# Patient Record
Sex: Male | Born: 1950 | Race: White | Hispanic: No | Marital: Married | Smoking: Former smoker
Health system: Southern US, Community
[De-identification: ages and names within clinical notes are randomized; demographics above are authoritative.]

## PROBLEM LIST (undated history)

## (undated) ENCOUNTER — Emergency Department (HOSPITAL_COMMUNITY): Admission: EM | Payer: Self-pay | Source: Home / Self Care

## (undated) DIAGNOSIS — G4730 Sleep apnea, unspecified: Secondary | ICD-10-CM

## (undated) DIAGNOSIS — F952 Tourette's disorder: Secondary | ICD-10-CM

## (undated) DIAGNOSIS — Z923 Personal history of irradiation: Secondary | ICD-10-CM

## (undated) DIAGNOSIS — E119 Type 2 diabetes mellitus without complications: Secondary | ICD-10-CM

## (undated) DIAGNOSIS — E7800 Pure hypercholesterolemia, unspecified: Secondary | ICD-10-CM

## (undated) DIAGNOSIS — R339 Retention of urine, unspecified: Secondary | ICD-10-CM

## (undated) DIAGNOSIS — K219 Gastro-esophageal reflux disease without esophagitis: Secondary | ICD-10-CM

## (undated) DIAGNOSIS — I4891 Unspecified atrial fibrillation: Secondary | ICD-10-CM

## (undated) DIAGNOSIS — Z973 Presence of spectacles and contact lenses: Secondary | ICD-10-CM

## (undated) DIAGNOSIS — I10 Essential (primary) hypertension: Secondary | ICD-10-CM

## (undated) DIAGNOSIS — I82409 Acute embolism and thrombosis of unspecified deep veins of unspecified lower extremity: Secondary | ICD-10-CM

## (undated) DIAGNOSIS — M1990 Unspecified osteoarthritis, unspecified site: Secondary | ICD-10-CM

## (undated) DIAGNOSIS — C61 Malignant neoplasm of prostate: Secondary | ICD-10-CM

## (undated) DIAGNOSIS — T8859XA Other complications of anesthesia, initial encounter: Secondary | ICD-10-CM

## (undated) DIAGNOSIS — Z978 Presence of other specified devices: Secondary | ICD-10-CM

## (undated) HISTORY — DX: Unspecified atrial fibrillation: I48.91

## (undated) HISTORY — PX: TONSILLECTOMY: SUR1361

## (undated) HISTORY — PX: SPINE SURGERY: SHX786

---
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
## 1999-07-10 ENCOUNTER — Encounter: Payer: Self-pay | Admitting: *Deleted

## 1999-07-10 ENCOUNTER — Ambulatory Visit (HOSPITAL_COMMUNITY): Admission: RE | Admit: 1999-07-10 | Discharge: 1999-07-10 | Payer: Self-pay | Admitting: *Deleted

## 1999-09-02 HISTORY — PX: KNEE SURGERY: SHX244

## 1999-12-30 ENCOUNTER — Emergency Department (HOSPITAL_COMMUNITY): Admission: EM | Admit: 1999-12-30 | Discharge: 1999-12-30 | Payer: Self-pay | Admitting: Emergency Medicine

## 2000-03-09 ENCOUNTER — Emergency Department (HOSPITAL_COMMUNITY): Admission: EM | Admit: 2000-03-09 | Discharge: 2000-03-09 | Payer: Self-pay | Admitting: *Deleted

## 2001-02-22 ENCOUNTER — Emergency Department (HOSPITAL_COMMUNITY): Admission: EM | Admit: 2001-02-22 | Discharge: 2001-02-22 | Payer: Self-pay | Admitting: Internal Medicine

## 2004-04-17 ENCOUNTER — Emergency Department (HOSPITAL_COMMUNITY): Admission: EM | Admit: 2004-04-17 | Discharge: 2004-04-17 | Payer: Self-pay

## 2004-05-18 ENCOUNTER — Emergency Department (HOSPITAL_COMMUNITY): Admission: EM | Admit: 2004-05-18 | Discharge: 2004-05-18 | Payer: Self-pay | Admitting: Emergency Medicine

## 2008-05-25 ENCOUNTER — Emergency Department (HOSPITAL_COMMUNITY): Admission: EM | Admit: 2008-05-25 | Discharge: 2008-05-25 | Payer: Self-pay | Admitting: Emergency Medicine

## 2011-10-08 ENCOUNTER — Ambulatory Visit (INDEPENDENT_AMBULATORY_CARE_PROVIDER_SITE_OTHER): Payer: BC Managed Care – PPO | Admitting: Physician Assistant

## 2011-10-08 VITALS — BP 140/78 | HR 76 | Temp 98.1°F | Resp 16 | Ht 70.0 in | Wt 288.4 lb

## 2011-10-08 DIAGNOSIS — M79609 Pain in unspecified limb: Secondary | ICD-10-CM

## 2011-10-08 DIAGNOSIS — S61209A Unspecified open wound of unspecified finger without damage to nail, initial encounter: Secondary | ICD-10-CM

## 2011-10-08 DIAGNOSIS — M79646 Pain in unspecified finger(s): Secondary | ICD-10-CM

## 2011-10-08 NOTE — Patient Instructions (Signed)
WOUND CARE °Please return in 8 days to have your °stitches/staples removed or sooner if you have concerns. ° Keep area clean and dry for 24 hours. Do not remove °bandage, if applied. ° After 24 hours, remove bandage and wash wound °gently with mild soap and warm water. Reapply °a new bandage after cleaning wound, if directed. ° Continue daily cleansing with soap and water until °stitches/staples are removed. ° Do not apply any ointments or creams to the wound °while stitches/staples are in place, as this may cause °delayed healing. ° Notify the office if you experience any of the following °signs of infection: Swelling, redness, pus drainage, °streaking, fever >101.0 F ° Notify the office if you experience excessive bleeding °that does not stop after 15-20 minutes of constant, firm °pressure. ° °

## 2011-10-08 NOTE — Progress Notes (Signed)
  Subjective:    Patient ID: Tommy Yang, male    DOB: 04-21-1951, 61 y.o.   MRN: [id number]  HPI Patient presents after accidentally cutting the tip of his left middle finger with an X-Acto knife this morning. He builds model cars. Last tetanus was more than 5 years ago but less than 10. He advises me that 2-3 5-0 sutures will likely do the job. 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  He is right-hand dominant.  Review of Systems Remarkable for a wound to the tip of the left middle finger, otherwise negative.    Objective:   Physical Exam  Superficial wound at the tip of the left middle finger. Significant callus at the tips of all fingers consistent with his hobby of modeling. Good sensation in full range of motion. No evidence of infection.  Verbal consent obtained from the patient.  Local anesthesia with 4cc Lidocaine 2% without epinephrine.  Wound scrubbed with soap and water and rinsed.  Wound closed with 1 5-0 Prolene horizontal mattress sutures.  Wound cleansed and dressed.     Assessment & Plan:  1. pain, finger  2. wound, finger  TDap given. Local wound care. Return in 7-10 days for suture removal, sooner if needed.

## 2012-02-24 ENCOUNTER — Ambulatory Visit: Payer: BC Managed Care – PPO

## 2012-02-24 ENCOUNTER — Ambulatory Visit (INDEPENDENT_AMBULATORY_CARE_PROVIDER_SITE_OTHER): Payer: BC Managed Care – PPO | Admitting: Family Medicine

## 2012-02-24 VITALS — BP 135/74 | HR 68 | Temp 98.4°F | Resp 18 | Ht 70.0 in | Wt 272.0 lb

## 2012-02-24 DIAGNOSIS — M79609 Pain in unspecified limb: Secondary | ICD-10-CM

## 2012-02-24 DIAGNOSIS — S99921A Unspecified injury of right foot, initial encounter: Secondary | ICD-10-CM

## 2012-02-24 DIAGNOSIS — M79676 Pain in unspecified toe(s): Secondary | ICD-10-CM

## 2012-02-24 DIAGNOSIS — S8990XA Unspecified injury of unspecified lower leg, initial encounter: Secondary | ICD-10-CM

## 2012-02-24 NOTE — Progress Notes (Signed)
Subjective: 61 year old white male who stubbed his toes of the right foot last night against day table leg. He was barefooted. He has pain in the third and fourth toes.  Objective: Mild redness at the tip of the third toe. Both third and fourth ovary tender to touch. The metatarsals do not seem tender. There the toes also fine.  Assessment: Third and fourth toe pain secondary to stubbing toe  Plan: X-ray toes  [facility] reading (PRIMARY) by  Dr. Alwyn Ren No fracture  Reassurance. No fracture seen. I'll let him know if radiologist sees anything else. He is to be careful and to try and avoid reinjury to the foot. Return if problems. Thank you

## 2012-02-24 NOTE — Patient Instructions (Addendum)
Wear comfortable shoes. Consider taping the toes if pain is persisting. Return if worse. [name]

## 2013-01-15 ENCOUNTER — Ambulatory Visit: Payer: BC Managed Care – PPO

## 2013-01-15 ENCOUNTER — Ambulatory Visit (INDEPENDENT_AMBULATORY_CARE_PROVIDER_SITE_OTHER): Payer: BC Managed Care – PPO | Admitting: Family Medicine

## 2013-01-15 VITALS — BP 128/74 | HR 77 | Temp 99.0°F | Resp 17 | Ht 69.0 in | Wt 279.0 lb

## 2013-01-15 DIAGNOSIS — R05 Cough: Secondary | ICD-10-CM

## 2013-01-15 DIAGNOSIS — J189 Pneumonia, unspecified organism: Secondary | ICD-10-CM

## 2013-01-15 DIAGNOSIS — R059 Cough, unspecified: Secondary | ICD-10-CM

## 2013-01-15 MED ORDER — AZITHROMYCIN 250 MG PO TABS: ORAL_TABLET | ORAL | Status: DC

## 2013-01-15 NOTE — Patient Instructions (Signed)
Start the antibiotic for possible early pneumonia.  mucinex or Mucinex DM for cough - stop and return to clinic if any wheezing, or more shortness of breath. If not improving in 2-3 days - return to clinic.  Return to the clinic or go to the nearest emergency room if any of your symptoms worsen or new symptoms occur.   Pneumonia, Adult Pneumonia is an infection of the lungs.  CAUSES Pneumonia may be caused by bacteria or a virus. Usually, these infections are caused by breathing infectious particles into the lungs (respiratory tract). SYMPTOMS   Cough.  Fever.  Chest pain.  Increased rate of breathing.  Wheezing.  Mucus production. DIAGNOSIS  If you have the common symptoms of pneumonia, your caregiver will typically confirm the diagnosis with a chest X-ray. The X-ray will show an abnormality in the lung (pulmonary infiltrate) if you have pneumonia. Other tests of your blood, urine, or sputum may be done to find the specific cause of your pneumonia. Your caregiver may also do tests (blood gases or pulse oximetry) to see how well your lungs are working. TREATMENT  Some forms of pneumonia may be spread to other people when you cough or sneeze. You may be asked to wear a mask before and during your exam. Pneumonia that is caused by bacteria is treated with antibiotic medicine. Pneumonia that is caused by the influenza virus may be treated with an antiviral medicine. Most other viral infections must run their course. These infections will not respond to antibiotics.  PREVENTION A pneumococcal shot (vaccine) is available to prevent a common bacterial cause of pneumonia. This is usually suggested for:  People over 55 years old.  Patients on chemotherapy.  People with chronic lung problems, such as bronchitis or emphysema.  People with immune system problems. If you are over 65 or have a high risk condition, you may receive the pneumococcal vaccine if you have not received it before. In  some countries, a routine influenza vaccine is also recommended. This vaccine can help prevent some cases of pneumonia.You may be offered the influenza vaccine as part of your care. If you smoke, it is time to quit. You may receive instructions on how to stop smoking. Your caregiver can provide medicines and counseling to help you quit. HOME CARE INSTRUCTIONS   Cough suppressants may be used if you are losing too much rest. However, coughing protects you by clearing your lungs. You should avoid using cough suppressants if you can.  Your caregiver may have prescribed medicine if he or she thinks your pneumonia is caused by a bacteria or influenza. Finish your medicine even if you start to feel better.  Your caregiver may also prescribe an expectorant. This loosens the mucus to be coughed up.  Only take over-the-counter or prescription medicines for pain, discomfort, or fever as directed by your caregiver.  Do not smoke. Smoking is a common cause of bronchitis and can contribute to pneumonia. If you are a smoker and continue to smoke, your cough may last several weeks after your pneumonia has cleared.  A cold steam vaporizer or humidifier in your room or home may help loosen mucus.  Coughing is often worse at night. Sleeping in a semi-upright position in a recliner or using a couple pillows under your head will help with this.  Get rest as you feel it is needed. Your body will usually let you know when you need to rest. SEEK IMMEDIATE MEDICAL CARE IF:   Your illness becomes worse.  This is especially true if you are elderly or weakened from any other disease.  You cannot control your cough with suppressants and are losing sleep.  You begin coughing up blood.  You develop pain which is getting worse or is uncontrolled with medicines.  You have a fever.  Any of the symptoms which initially brought you in for treatment are getting worse rather than better.  You develop shortness of breath  or chest pain. MAKE SURE YOU:   Understand these instructions.  Will watch your condition.  Will get help right away if you are not doing well or get worse. Document Released: 08/18/2005 Document Revised: 11/10/2011 Document Reviewed: 11/07/2010 [facility] Patient Information 2013 [place], [place].

## 2013-01-15 NOTE — Progress Notes (Signed)
Subjective:    Patient ID: Tommy Yang, male    DOB: February 06, 1951, 62 y.o.   MRN: [id number]  HPI Tommy Yang is a 62 y.o. male  Started 4 days ago with cough, runny nose. Started after being in [place] and in pool.  No fever, but chills at times.  clear yellow phlegm. Occasional shortness of breath with cough only.  Able to mow lawn yesterday.  Slight nasal congestion after coughing fit. Slight less cough today.  Heard congestion few days ago, no wheeze, no hx of COPD/Asthma.   No known sick contacts.   Quit smoking 3 years ago - 12 year pack hx.    Review of Systems  Constitutional: Positive for chills. Negative for fever.  HENT: Positive for congestion.   Respiratory: Positive for cough and shortness of breath (with coughing spell only. ). Negative for choking and chest tightness.   Cardiovascular: Negative for chest pain.       Objective:   Physical Exam  Vitals reviewed. Constitutional: He is oriented to person, place, and time. He appears well-developed and well-nourished. No distress.  Obese.   HENT:  Head: Normocephalic and atraumatic.  Right Ear: Tympanic membrane, external ear and ear canal normal.  Left Ear: Tympanic membrane, external ear and ear canal normal.  Nose: No rhinorrhea.  Mouth/Throat: Oropharynx is clear and moist and mucous membranes are normal. No oropharyngeal exudate or posterior oropharyngeal erythema.  Eyes: Conjunctivae are normal. Pupils are equal, round, and reactive to light.  Neck: Neck supple.  Cardiovascular: Normal rate, regular rhythm, normal heart sounds and intact distal pulses.   No murmur heard. Pulmonary/Chest: Effort normal. No accessory muscle usage. Not tachypneic. No respiratory distress. He has no decreased breath sounds. He has no wheezes. He has rhonchi in the right middle field. He has no rales.  Abdominal: Soft. There is no tenderness.  Lymphadenopathy:    He has no cervical adenopathy.  Neurological: He is alert and  oriented to person, place, and time.  Skin: Skin is warm and dry. No rash noted. He is not diaphoretic.  Psychiatric: He has a normal mood and affect. His behavior is normal.    [facility] reading (PRIMARY) by  Dr. Neva Seat: CXR: few increased markings/faint infiltrate - RLL/retrocardiac on lateral.      Assessment & Plan:  Tommy Yang is a 62 y.o. male Cough - Plan: DG Chest 2 View, azithromycin (ZITHROMAX) 250 MG tablet  RLL pneumonia - Plan: azithromycin (ZITHROMAX) 250 MG tablet  Early RLL vs RML pna - with some subjective improvement today. Start zpak, mucinex prn cough, rtc precautions discussed including if any dyspnea, worsening cough, or fever. Understanding expressed.   Meds ordered this encounter  Medications  . azithromycin (ZITHROMAX) 250 MG tablet    Sig: Take 2 pills by mouth on day 1, then 1 pill by mouth per day on days 2 through 5.    Dispense:  6 each    Refill:  0   Patient Instructions  Start the antibiotic for possible early pneumonia.  mucinex or Mucinex DM for cough - stop and return to clinic if any wheezing, or more shortness of breath. If not improving in 2-3 days - return to clinic.  Return to the clinic or go to the nearest emergency room if any of your symptoms worsen or new symptoms occur.   Pneumonia, Adult Pneumonia is an infection of the lungs.  CAUSES Pneumonia may be caused by bacteria or a virus. Usually, these infections are caused  by breathing infectious particles into the lungs (respiratory tract). SYMPTOMS   Cough.  Fever.  Chest pain.  Increased rate of breathing.  Wheezing.  Mucus production. DIAGNOSIS  If you have the common symptoms of pneumonia, your caregiver will typically confirm the diagnosis with a chest X-ray. The X-ray will show an abnormality in the lung (pulmonary infiltrate) if you have pneumonia. Other tests of your blood, urine, or sputum may be done to find the specific cause of your pneumonia. Your caregiver may  also do tests (blood gases or pulse oximetry) to see how well your lungs are working. TREATMENT  Some forms of pneumonia may be spread to other people when you cough or sneeze. You may be asked to wear a mask before and during your exam. Pneumonia that is caused by bacteria is treated with antibiotic medicine. Pneumonia that is caused by the influenza virus may be treated with an antiviral medicine. Most other viral infections must run their course. These infections will not respond to antibiotics.  PREVENTION A pneumococcal shot (vaccine) is available to prevent a common bacterial cause of pneumonia. This is usually suggested for:  People over 76 years old.  Patients on chemotherapy.  People with chronic lung problems, such as bronchitis or emphysema.  People with immune system problems. If you are over 65 or have a high risk condition, you may receive the pneumococcal vaccine if you have not received it before. In some countries, a routine influenza vaccine is also recommended. This vaccine can help prevent some cases of pneumonia.You may be offered the influenza vaccine as part of your care. If you smoke, it is time to quit. You may receive instructions on how to stop smoking. Your caregiver can provide medicines and counseling to help you quit. HOME CARE INSTRUCTIONS   Cough suppressants may be used if you are losing too much rest. However, coughing protects you by clearing your lungs. You should avoid using cough suppressants if you can.  Your caregiver may have prescribed medicine if he or she thinks your pneumonia is caused by a bacteria or influenza. Finish your medicine even if you start to feel better.  Your caregiver may also prescribe an expectorant. This loosens the mucus to be coughed up.  Only take over-the-counter or prescription medicines for pain, discomfort, or fever as directed by your caregiver.  Do not smoke. Smoking is a common cause of bronchitis and can contribute  to pneumonia. If you are a smoker and continue to smoke, your cough may last several weeks after your pneumonia has cleared.  A cold steam vaporizer or humidifier in your room or home may help loosen mucus.  Coughing is often worse at night. Sleeping in a semi-upright position in a recliner or using a couple pillows under your head will help with this.  Get rest as you feel it is needed. Your body will usually let you know when you need to rest. SEEK IMMEDIATE MEDICAL CARE IF:   Your illness becomes worse. This is especially true if you are elderly or weakened from any other disease.  You cannot control your cough with suppressants and are losing sleep.  You begin coughing up blood.  You develop pain which is getting worse or is uncontrolled with medicines.  You have a fever.  Any of the symptoms which initially brought you in for treatment are getting worse rather than better.  You develop shortness of breath or chest pain. MAKE SURE YOU:   Understand these instructions.  Will watch your condition.  Will get help right away if you are not doing well or get worse. Document Released: 08/18/2005 Document Revised: 11/10/2011 Document Reviewed: 11/07/2010 [facility] Patient Information 2013 [place], [place].

## 2013-05-06 ENCOUNTER — Ambulatory Visit (INDEPENDENT_AMBULATORY_CARE_PROVIDER_SITE_OTHER): Payer: BC Managed Care – PPO | Admitting: Internal Medicine

## 2013-05-06 VITALS — BP 128/82 | HR 64 | Temp 97.8°F | Resp 16 | Ht 71.0 in | Wt 282.4 lb

## 2013-05-06 DIAGNOSIS — Z0000 Encounter for general adult medical examination without abnormal findings: Secondary | ICD-10-CM

## 2013-05-06 DIAGNOSIS — M25561 Pain in right knee: Secondary | ICD-10-CM

## 2013-05-06 DIAGNOSIS — Z1211 Encounter for screening for malignant neoplasm of colon: Secondary | ICD-10-CM

## 2013-05-06 DIAGNOSIS — M25569 Pain in unspecified knee: Secondary | ICD-10-CM

## 2013-05-06 DIAGNOSIS — R9431 Abnormal electrocardiogram [ECG] [EKG]: Secondary | ICD-10-CM

## 2013-05-06 DIAGNOSIS — Z23 Encounter for immunization: Secondary | ICD-10-CM

## 2013-05-06 LAB — POCT URINALYSIS DIPSTICK
Bilirubin, UA: NEGATIVE
Blood, UA: NEGATIVE
Glucose, UA: NEGATIVE
Ketones, UA: NEGATIVE
Leukocytes, UA: NEGATIVE
Nitrite, UA: NEGATIVE
Protein, UA: 30
Spec Grav, UA: <=1.005
Urobilinogen, UA: 0.2
pH, UA: 5

## 2013-05-06 LAB — LIPID PANEL
Cholesterol: 249 mg/dL — ABNORMAL HIGH (ref 0–200)
HDL: 30 mg/dL — ABNORMAL LOW (ref >39)
LDL Cholesterol: 147 mg/dL — ABNORMAL HIGH (ref 0–99)
Total CHOL/HDL Ratio: 8.3 Ratio
Triglycerides: 362 mg/dL — ABNORMAL HIGH (ref <150)
VLDL: 72 mg/dL — ABNORMAL HIGH (ref 0–40)

## 2013-05-06 LAB — POCT CBC
Granulocyte percent: 58.9 %G (ref 37–80)
HCT, POC: 50.1 % (ref 43.5–53.7)
Hemoglobin: 17.3 g/dL (ref 14.1–18.1)
Lymph, poc: 2.7 (ref 0.6–3.4)
MCH, POC: 30.4 pg (ref 27–31.2)
MCHC: 34.5 g/dL (ref 31.8–35.4)
MCV: 87.9 fL (ref 80–97)
MID (cbc): 0.9 (ref 0–0.9)
MPV: 9.9 fL (ref 0–99.8)
POC Granulocyte: 6.7 (ref 2–6.9)
POC LYMPH PERCENT: 33 %L (ref 10–50)
POC MID %: 8.1 %M (ref 0–12)
Platelet Count, POC: 233 10*3/uL (ref 142–424)
RBC: 5.7 M/uL (ref 4.69–6.13)
RDW, POC: 14.5 %
WBC: 11.3 10*3/uL — AB (ref 4.6–10.2)

## 2013-05-06 LAB — POCT UA - MICROSCOPIC ONLY
Bacteria, U Microscopic: NEGATIVE
Casts, Ur, LPF, POC: NEGATIVE
Crystals, Ur, HPF, POC: NEGATIVE
Epithelial cells, urine per micros: NEGATIVE
Mucus, UA: NEGATIVE
RBC, urine, microscopic: NEGATIVE
WBC, Ur, HPF, POC: NEGATIVE
Yeast, UA: NEGATIVE

## 2013-05-06 LAB — COMPREHENSIVE METABOLIC PANEL
ALT: 14 U/L (ref 0–53)
AST: 11 U/L (ref 0–37)
Albumin: 4.3 g/dL (ref 3.5–5.2)
Alkaline Phosphatase: 71 U/L (ref 39–117)
BUN: 14 mg/dL (ref 6–23)
CO2: 28 mEq/L (ref 19–32)
Calcium: 9.4 mg/dL (ref 8.4–10.5)
Chloride: 101 mEq/L (ref 96–112)
Creat: 0.91 mg/dL (ref 0.50–1.35)
Glucose, Bld: 212 mg/dL — ABNORMAL HIGH (ref 70–99)
Potassium: 5 mEq/L (ref 3.5–5.3)
Sodium: 137 mEq/L (ref 135–145)
Total Bilirubin: 0.7 mg/dL (ref 0.3–1.2)
Total Protein: 7.2 g/dL (ref 6.0–8.3)

## 2013-05-06 LAB — TSH: TSH: 0.892 u[IU]/mL (ref 0.350–4.500)

## 2013-05-06 LAB — GLUCOSE, POCT (MANUAL RESULT ENTRY): POC Glucose: 162 mg/dl — AB (ref 70–99)

## 2013-05-06 LAB — PSA: PSA: 6.18 ng/mL — ABNORMAL HIGH (ref <=4.00)

## 2013-05-06 LAB — POCT GLYCOSYLATED HEMOGLOBIN (HGB A1C): Hemoglobin A1C: 10.7

## 2013-05-06 NOTE — Progress Notes (Deleted)
  Subjective:    Patient ID: Tommy Yang, male    DOB: 06/28/1951, 62 y.o.   MRN: [id number]  HPI 62 year old Caucasian woman is here today to have labwork done. Pt states she does have a PCP but could not see him until a later date. Pt states the labwork is a biometric screening     Review of Systems     Objective:   Physical Exam        Assessment & Plan:

## 2013-05-06 NOTE — Progress Notes (Signed)
  Subjective:    Patient ID: Tommy Yang, male    DOB: 1950-11-07, 62 y.o.   MRN: [id number]  HPI 62 year old Caucasian male is here for a Biometrics Screening. Pt states he is fasting. Pt states [facility] is his PCP. Pt states he has not had a colonoscopy or been to an optometrist recently. Pt states he did have a CPE last year at [facility] Dr. Azucena Cecil. Pt states he is not on any prescribed medications and feels fine. Pt states his knees does give him problems sometimes. Pt states he has no other complaints.  No smoke Needs insurance form for work main reason here.  Review of Systems  Constitutional: Negative.   HENT: Negative.   Eyes: Negative.   Respiratory: Negative.   Cardiovascular: Negative.   Gastrointestinal: Negative.   Endocrine: Negative.   Musculoskeletal: Positive for arthralgias.  Skin: Negative.   Allergic/Immunologic: Negative.   Neurological: Negative.   Hematological: Negative.   Psychiatric/Behavioral: Negative.        Objective:   Physical Exam  Constitutional: He is oriented to person, place, and time. He appears well-nourished. No distress.  HENT:  Head: Normocephalic.  Right Ear: External ear normal.  Left Ear: External ear normal.  Nose: Nose normal.  Mouth/Throat: Oropharynx is clear and moist.  Eyes: Conjunctivae and EOM are normal. Pupils are equal, round, and reactive to light.  Neck: Normal range of motion. Neck supple. No tracheal deviation present. No thyromegaly present.  Cardiovascular: Normal rate, regular rhythm, normal heart sounds and intact distal pulses.   Pulmonary/Chest: Effort normal.  Abdominal: Soft. He exhibits no mass. There is no tenderness. There is no guarding.  Genitourinary: Penis normal.  Musculoskeletal: Normal range of motion. He exhibits tenderness.  Lymphadenopathy:    He has no cervical adenopathy.  Neurological: He is alert and oriented to person, place, and time. He has normal reflexes. No cranial nerve  deficit. He exhibits normal muscle tone. Coordination normal.  Skin: Skin is warm and dry. No rash noted.  Psychiatric: He has a normal mood and affect. His behavior is normal. Judgment normal.   Obesity No results found for this or any previous visit. EKG inferior lateral twave changes, no sxs      Assessment & Plan:  Get Colonoscopy soon/Refer to cardiology next week Lose weight Shingles vac ordered

## 2013-05-06 NOTE — Patient Instructions (Addendum)
DASH Diet The DASH diet stands for "Dietary Approaches to Stop Hypertension." It is a healthy eating plan that has been shown to reduce high blood pressure (hypertension) in as little as 14 days, while also possibly providing other significant health benefits. These other health benefits include reducing the risk of breast cancer after menopause and reducing the risk of type 2 diabetes, heart disease, colon cancer, and stroke. Health benefits also include weight loss and slowing kidney failure in patients with chronic kidney disease.  DIET GUIDELINES  Limit salt (sodium). Your diet should contain less than 1500 mg of sodium daily.  Limit refined or processed carbohydrates. Your diet should include mostly whole grains. Desserts and added sugars should be used sparingly.  Include small amounts of heart-healthy fats. These types of fats include nuts, oils, and tub margarine. Limit saturated and trans fats. These fats have been shown to be harmful in the body. CHOOSING FOODS  The following food groups are based on a 2000 calorie diet. See your Registered Dietitian for individual calorie needs. Grains and Grain Products (6 to 8 servings daily)  Eat More Often: Whole-wheat bread, brown rice, whole-grain or wheat pasta, quinoa, popcorn without added fat or salt (air popped).  Eat Less Often: White bread, white pasta, white rice, cornbread. Vegetables (4 to 5 servings daily)  Eat More Often: Fresh, frozen, and canned vegetables. Vegetables may be raw, steamed, roasted, or grilled with a minimal amount of fat.  Eat Less Often/Avoid: Creamed or fried vegetables. Vegetables in a cheese sauce. Fruit (4 to 5 servings daily)  Eat More Often: All fresh, canned (in natural juice), or frozen fruits. Dried fruits without added sugar. One hundred percent fruit juice ( cup [237 mL] daily).  Eat Less Often: Dried fruits with added sugar. Canned fruit in light or heavy syrup. Lean Meats, Fish, and Poultry (2  servings or less daily. One serving is 3 to 4 oz [85-114 g]).  Eat More Often: Ninety percent or leaner ground beef, tenderloin, sirloin. Round cuts of beef, chicken breast, turkey breast. All fish. Grill, bake, or broil your meat. Nothing should be fried.  Eat Less Often/Avoid: Fatty cuts of meat, turkey, or chicken leg, thigh, or wing. Fried cuts of meat or fish. Dairy (2 to 3 servings)  Eat More Often: Low-fat or fat-free milk, low-fat plain or light yogurt, reduced-fat or part-skim cheese.  Eat Less Often/Avoid: Milk (whole, 2%).Whole milk yogurt. Full-fat cheeses. Nuts, Seeds, and Legumes (4 to 5 servings per week)  Eat More Often: All without added salt.  Eat Less Often/Avoid: Salted nuts and seeds, canned beans with added salt. Fats and Sweets (limited)  Eat More Often: Vegetable oils, tub margarines without trans fats, sugar-free gelatin. Mayonnaise and salad dressings.  Eat Less Often/Avoid: Coconut oils, palm oils, butter, stick margarine, cream, half and half, cookies, candy, pie. FOR MORE INFORMATION The Dash Diet Eating Plan: www.dashdiet.org Document Released: 08/07/2011 Document Revised: 11/10/2011 Document Reviewed: 08/07/2011 [employer] Patient Information 2014 [employer]. Exercise to Lose Weight Exercise and a healthy diet may help you lose weight. Your doctor may suggest specific exercises. EXERCISE IDEAS AND TIPS  Choose low-cost things you enjoy doing, such as walking, bicycling, or exercising to workout videos.  Take stairs instead of the elevator.  Walk during your lunch break.  Park your car further away from work or school.  Go to a gym or an exercise class.  Start with 5 to 10 minutes of exercise each day. Build up to 30 minutes   of exercise 4 to 6 days a week.  Wear shoes with good support and comfortable clothes.  Stretch before and after working out.  Work out until you breathe harder and your heart beats faster.  Drink extra water when  you exercise.  Do not do so much that you hurt yourself, feel dizzy, or get very short of breath. Exercises that burn about 150 calories:  Running 1  miles in 15 minutes.  Playing volleyball for 45 to 60 minutes.  Washing and waxing a car for 45 to 60 minutes.  Playing touch football for 45 minutes.  Walking 1  miles in 35 minutes.  Pushing a stroller 1  miles in 30 minutes.  Playing basketball for 30 minutes.  Raking leaves for 30 minutes.  Bicycling 5 miles in 30 minutes.  Walking 2 miles in 30 minutes.  Dancing for 30 minutes.  Shoveling snow for 15 minutes.  Swimming laps for 20 minutes.  Walking up stairs for 15 minutes.  Bicycling 4 miles in 15 minutes.  Gardening for 30 to 45 minutes.  Jumping rope for 15 minutes.  Washing windows or floors for 45 to 60 minutes. Document Released: 09/20/2010 Document Revised: 11/10/2011 Document Reviewed: 09/20/2010 [employer] Patient Information 2014 [employer].  

## 2013-09-01 DIAGNOSIS — I82409 Acute embolism and thrombosis of unspecified deep veins of unspecified lower extremity: Secondary | ICD-10-CM

## 2013-09-01 HISTORY — DX: Acute embolism and thrombosis of unspecified deep veins of unspecified lower extremity: I82.409

## 2013-09-21 ENCOUNTER — Ambulatory Visit: Payer: BC Managed Care – PPO

## 2013-09-21 ENCOUNTER — Ambulatory Visit (HOSPITAL_COMMUNITY)
Admission: RE | Admit: 2013-09-21 | Discharge: 2013-09-21 | Disposition: A | Discharge status: Home / Self Care | Payer: BC Managed Care – PPO | Source: Ambulatory Visit | Attending: Family Medicine | Admitting: Family Medicine

## 2013-09-21 ENCOUNTER — Ambulatory Visit (INDEPENDENT_AMBULATORY_CARE_PROVIDER_SITE_OTHER): Payer: BC Managed Care – PPO | Admitting: Family Medicine

## 2013-09-21 ENCOUNTER — Encounter (HOSPITAL_COMMUNITY): Payer: Self-pay | Admitting: Emergency Medicine

## 2013-09-21 VITALS — BP 128/72 | HR 74 | Temp 98.8°F | Resp 18 | Ht 70.5 in | Wt 281.0 lb

## 2013-09-21 DIAGNOSIS — R0989 Other specified symptoms and signs involving the circulatory and respiratory systems: Secondary | ICD-10-CM

## 2013-09-21 DIAGNOSIS — M79609 Pain in unspecified limb: Secondary | ICD-10-CM

## 2013-09-21 DIAGNOSIS — I82A19 Acute embolism and thrombosis of unspecified axillary vein: Secondary | ICD-10-CM

## 2013-09-21 DIAGNOSIS — I82819 Embolism and thrombosis of superficial veins of unspecified lower extremities: Secondary | ICD-10-CM | POA: Insufficient documentation

## 2013-09-21 DIAGNOSIS — M7989 Other specified soft tissue disorders: Secondary | ICD-10-CM

## 2013-09-21 DIAGNOSIS — M79606 Pain in leg, unspecified: Secondary | ICD-10-CM

## 2013-09-21 DIAGNOSIS — I82A12 Acute embolism and thrombosis of left axillary vein: Secondary | ICD-10-CM

## 2013-09-21 MED ORDER — RIVAROXABAN 15 MG PO TABS: 15.0000 mg | ORAL_TABLET | Freq: Two times a day (BID) | ORAL | Status: DC

## 2013-09-21 NOTE — Addendum Note (Signed)
Addended by: Gustavus Bryant on: 09/21/2013 12:02 PM   Modules accepted: Orders

## 2013-09-21 NOTE — Progress Notes (Signed)
VASCULAR LAB PRELIMINARY  PRELIMINARY  PRELIMINARY  PRELIMINARY  Bilateral lower extremity venous duplex completed.    Preliminary report:  Right : No evidence of DTV, superficial thrombosis, Baker's cyst. 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 There is also thrombus noted in the saphenofemoral junction however the greater saphenous vein appears patent at this time  Yashua Bracco, RVS 09/21/2013, 11:03 AM

## 2013-09-21 NOTE — Addendum Note (Signed)
Addended by: HOPPER, DAVID H on: 09/21/2013 12:27 PM   Modules accepted: Orders

## 2013-09-21 NOTE — Patient Instructions (Addendum)
Get the ultrasound of your calf as directed. We will have the reports called.  Go to vascular lab at [place]    Return in one week for a recheck, sooner if worse  In the event of acute shortness of breath or chest pain or coughing blood call 911 or go to emergency room  Take the medicine faithfully

## 2013-09-21 NOTE — ED Notes (Signed)
Pt brought here from vascular for DVT, pt does not want to be admitted and sts he just needs the medicine.

## 2013-09-21 NOTE — Progress Notes (Addendum)
Subjective: 63 year old man who presents with a history of having had a sciatic pain down his left leg were Christmas time. That can feel a bit better than he developed pain in his left calf here for a lump in the back of his calf it was tender. He has been persisting with some swelling of his left leg, No known trauma. No history of blood clots. Does not smoke. Not on regular medicines. He did have a cough about same time and 1 congestion. He took some Mucinex and that clear on that. He still coughs up little phlegm. No chest pains. No shortness of breath.  Objective: Pleasant overweight gentleman in no major distress. TMs normal. Throat clear. Neck supple without nodes. Chest clear to auscultation. Heart regular without murmurs gallops or arrhythmias. No tachycardia abdomen soft nontender. He has left leg edema 3+ he is not tender in the thigh. He is tender in the mid medial calf. Negative Homans sign.  Assessment: Left calf pain, rule out DVT Left leg edema Cough, improving  Plan: The cough should take care of itself Scheduled for a left venous Doppler ultrasound.  Patient will have the scan done at [facility] now  Ultrasound report: Positive for a clot that goes all the way up into his femoral area.   Preliminary report: Right : No evidence of DTV, superficial thrombosis, Baker's cyst. 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 There is also thrombus noted in the saphenofemoral junction however the greater saphenous vein appears patent at this time   With this extensive a problem I felt like he should go to the emergency room for further evaluation and decide if he needed admission or around to the pharmacy to get Lovenox and then here for initiation of therapy, and him have a displaced clot in  route.  Patient ended up coming back over to the office writhing going to the emergency room. The situation was explained to the patient. I called and spoke with Teresa Coombs, M.D., third-year resident on admitting duty at [facility]. We discussed current management of such DVTs, and conclude that unless there is evidence for PE could probably be safely treated outpatient. The patient really does not want to be in the hospital. Apparently has smoked up until a year ago and has some episodes of shortness of breath time. I will check a chest x-ray on him to make sure it is okay before beginning outpatient therapy. O2 saturation is 94%  [facility] reading (PRIMARY) by  Dr. Linna Darner Normal chest x-ray, compared with 1 year ago.  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 COPD.  Plan: Treat outpatient with Serrato 15 twice a day for 3 weeks, then 20 daily. Return in one week for recheck.   [name]

## 2013-09-28 ENCOUNTER — Ambulatory Visit (INDEPENDENT_AMBULATORY_CARE_PROVIDER_SITE_OTHER): Payer: BC Managed Care – PPO | Admitting: Family Medicine

## 2013-09-28 VITALS — BP 160/88 | HR 74 | Temp 97.8°F | Resp 18 | Ht 70.5 in | Wt 285.6 lb

## 2013-09-28 DIAGNOSIS — R609 Edema, unspecified: Secondary | ICD-10-CM

## 2013-09-28 DIAGNOSIS — I82409 Acute embolism and thrombosis of unspecified deep veins of unspecified lower extremity: Secondary | ICD-10-CM

## 2013-09-28 DIAGNOSIS — Z86718 Personal history of other venous thrombosis and embolism: Secondary | ICD-10-CM | POA: Insufficient documentation

## 2013-09-28 DIAGNOSIS — I82402 Acute embolism and thrombosis of unspecified deep veins of left lower extremity: Secondary | ICD-10-CM | POA: Insufficient documentation

## 2013-09-28 DIAGNOSIS — R600 Localized edema: Secondary | ICD-10-CM | POA: Insufficient documentation

## 2013-09-28 MED ORDER — RIVAROXABAN 20 MG PO TABS: 20.0000 mg | ORAL_TABLET | Freq: Every day | ORAL | Status: DC

## 2013-09-28 NOTE — Progress Notes (Signed)
Subjective: Patient is here for followup with regard to the DVT in his left leg. He is doing very satisfactorily. Only has pain with direct pressure. He is doing a few little activities in the house. He is retired so is able to take his time is and keep his leg elevated a lot. He is taking the xarelto 15 mg twice daily. We talked about the need to decrease to 20 mg once daily after his completed the first 3 weeks.  Objective: No acute complaints. Legs are edematous still on the left. Only minimal erythema of the ankle where the skin is a little shiny, but the rest legs not red. Mild tenderness.  Assessment: Extensive DVT left lower extremity Left leg edema  Plan: Discussed venous valvular incompetence. He is going to try and wear compression hose. Progressed activity a little bit. Return in about 3 weeks. Sooner if problems.rx 20/30hose

## 2013-09-28 NOTE — Patient Instructions (Addendum)
Wear compression hose when up and around.  Keep leg elevated when seated, doing this as much of the time as possible  In the event of chest pain or sudden shortness of breath go directly to the emergency room. Call 911.  He may begin doing short walks and slowly advance activity  Return in about 3 weeks for followup  Take the 15 mg xarelto twice daily until finished a three-week course, then began the 20 mg dosing once daily at suppertime  Return sooner if problems arise

## 2013-10-07 ENCOUNTER — Telehealth: Payer: Self-pay

## 2013-10-07 DIAGNOSIS — I8290 Acute embolism and thrombosis of unspecified vein: Secondary | ICD-10-CM

## 2013-10-07 NOTE — Telephone Encounter (Signed)
Okay to refer? 

## 2013-10-07 NOTE — Telephone Encounter (Signed)
Pt has been seeing Dr. Linna Darner for a blood clot in his leg. Pt would like to be referred to a Vein Specialist to continue treatment ( he is happy with [name], just thinks he needs a specialist). He would like to go to Drs Chen/Fields on [address] (phone # [phone])  Pt phone [phone]

## 2013-10-19 ENCOUNTER — Emergency Department (HOSPITAL_COMMUNITY)
Admission: EM | Admit: 2013-10-19 | Discharge: 2013-10-19 | Disposition: A | Discharge status: Home / Self Care | Payer: BC Managed Care – PPO | Attending: Emergency Medicine | Admitting: Emergency Medicine

## 2013-10-19 ENCOUNTER — Encounter (HOSPITAL_COMMUNITY): Payer: Self-pay | Admitting: Emergency Medicine

## 2013-10-19 ENCOUNTER — Ambulatory Visit (INDEPENDENT_AMBULATORY_CARE_PROVIDER_SITE_OTHER): Payer: BC Managed Care – PPO | Admitting: Emergency Medicine

## 2013-10-19 VITALS — BP 142/72 | HR 68 | Temp 97.8°F | Resp 16 | Ht 71.0 in | Wt 282.0 lb

## 2013-10-19 DIAGNOSIS — E119 Type 2 diabetes mellitus without complications: Secondary | ICD-10-CM | POA: Insufficient documentation

## 2013-10-19 DIAGNOSIS — Z86718 Personal history of other venous thrombosis and embolism: Secondary | ICD-10-CM | POA: Insufficient documentation

## 2013-10-19 DIAGNOSIS — R739 Hyperglycemia, unspecified: Secondary | ICD-10-CM

## 2013-10-19 DIAGNOSIS — I82409 Acute embolism and thrombosis of unspecified deep veins of unspecified lower extremity: Secondary | ICD-10-CM | POA: Insufficient documentation

## 2013-10-19 DIAGNOSIS — Z79899 Other long term (current) drug therapy: Secondary | ICD-10-CM | POA: Insufficient documentation

## 2013-10-19 DIAGNOSIS — E1165 Type 2 diabetes mellitus with hyperglycemia: Secondary | ICD-10-CM | POA: Insufficient documentation

## 2013-10-19 DIAGNOSIS — Z7901 Long term (current) use of anticoagulants: Secondary | ICD-10-CM | POA: Insufficient documentation

## 2013-10-19 DIAGNOSIS — R600 Localized edema: Secondary | ICD-10-CM

## 2013-10-19 DIAGNOSIS — M7989 Other specified soft tissue disorders: Secondary | ICD-10-CM | POA: Insufficient documentation

## 2013-10-19 DIAGNOSIS — R609 Edema, unspecified: Secondary | ICD-10-CM

## 2013-10-19 DIAGNOSIS — R350 Frequency of micturition: Secondary | ICD-10-CM

## 2013-10-19 DIAGNOSIS — R972 Elevated prostate specific antigen [PSA]: Secondary | ICD-10-CM

## 2013-10-19 DIAGNOSIS — I82402 Acute embolism and thrombosis of unspecified deep veins of left lower extremity: Secondary | ICD-10-CM

## 2013-10-19 DIAGNOSIS — I10 Essential (primary) hypertension: Secondary | ICD-10-CM | POA: Insufficient documentation

## 2013-10-19 DIAGNOSIS — Z87891 Personal history of nicotine dependence: Secondary | ICD-10-CM | POA: Insufficient documentation

## 2013-10-19 HISTORY — DX: Type 2 diabetes mellitus without complications: E11.9

## 2013-10-19 HISTORY — DX: Acute embolism and thrombosis of unspecified deep veins of unspecified lower extremity: I82.409

## 2013-10-19 HISTORY — DX: Essential (primary) hypertension: I10

## 2013-10-19 LAB — BASIC METABOLIC PANEL
BUN: 13 mg/dL (ref 6–23)
CO2: 27 mEq/L (ref 19–32)
Calcium: 9.5 mg/dL (ref 8.4–10.5)
Chloride: 96 mEq/L (ref 96–112)
Creatinine, Ser: 0.89 mg/dL (ref 0.50–1.35)
GFR calc Af Amer: >90 mL/min (ref >90)
GFR calc non Af Amer: >90 mL/min (ref >90)
Glucose, Bld: 493 mg/dL — ABNORMAL HIGH (ref 70–99)
Potassium: 4.9 mEq/L (ref 3.7–5.3)
Sodium: 133 mEq/L — ABNORMAL LOW (ref 137–147)

## 2013-10-19 LAB — CBC
HCT: 41.5 % (ref 39.0–52.0)
Hemoglobin: 14.9 g/dL (ref 13.0–17.0)
MCH: 28.9 pg (ref 26.0–34.0)
MCHC: 35.9 g/dL (ref 30.0–36.0)
MCV: 80.4 fL (ref 78.0–100.0)
Platelets: 247 10*3/uL (ref 150–400)
RBC: 5.16 MIL/uL (ref 4.22–5.81)
RDW: 13.7 % (ref 11.5–15.5)
WBC: 9 10*3/uL (ref 4.0–10.5)

## 2013-10-19 LAB — POCT URINALYSIS DIPSTICK
Bilirubin, UA: NEGATIVE
Blood, UA: NEGATIVE
Glucose, UA: 500
Ketones, UA: NEGATIVE
Leukocytes, UA: NEGATIVE
Nitrite, UA: NEGATIVE
Protein, UA: NEGATIVE
Spec Grav, UA: <=1.005
Urobilinogen, UA: 0.2
pH, UA: 5

## 2013-10-19 LAB — GLUCOSE, CAPILLARY: Glucose-Capillary: 470 mg/dL — ABNORMAL HIGH (ref 70–99)

## 2013-10-19 LAB — POCT UA - MICROSCOPIC ONLY
Bacteria, U Microscopic: NEGATIVE
Casts, Ur, LPF, POC: NEGATIVE
Crystals, Ur, HPF, POC: NEGATIVE
Mucus, UA: NEGATIVE
RBC, urine, microscopic: NEGATIVE
Yeast, UA: NEGATIVE

## 2013-10-19 LAB — PROTIME-INR
INR: 1.55 — ABNORMAL HIGH (ref 0.00–1.49)
Prothrombin Time: 18.2 seconds — ABNORMAL HIGH (ref 11.6–15.2)

## 2013-10-19 LAB — GLUCOSE, POCT (MANUAL RESULT ENTRY): POC Glucose: >=444 mg/dl (ref 70–99)

## 2013-10-19 MED ORDER — SODIUM CHLORIDE 0.9 % IV BOLUS (SEPSIS)
1000.0000 mL | Freq: Once | INTRAVENOUS | Status: AC
  Administered 2013-10-19: 1000 mL via INTRAVENOUS

## 2013-10-19 MED ORDER — METFORMIN HCL 500 MG PO TABS: 500.0000 mg | ORAL_TABLET | Freq: Two times a day (BID) | ORAL | Status: DC

## 2013-10-19 NOTE — ED Provider Notes (Signed)
Medical screening examination/treatment/procedure(s) were performed by non-physician practitioner and as supervising physician I was immediately available for consultation/collaboration.  EKG Interpretation   None         Ben Sanz M Nanette Wirsing, MD 10/19/13 1704 

## 2013-10-19 NOTE — Discharge Instructions (Signed)
Begin taking metformin as prescribed. Followup with your primary care physician and vascular surgeon as scheduled. Hyperglycemia Hyperglycemia occurs when the glucose (sugar) in your blood is too high. Hyperglycemia can happen for many reasons, but it most often happens to people who do not know they have diabetes or are not managing their diabetes properly.  CAUSES  Whether you have diabetes or not, there are other causes of hyperglycemia. Hyperglycemia can occur when you have diabetes, but it can also occur in other situations that you might not be as aware of, such as: Diabetes  If you have diabetes and are having problems controlling your blood glucose, hyperglycemia could occur because of some of the following reasons:  Not following your meal plan.  Not taking your diabetes medications or not taking it properly.  Exercising less or doing less activity than you normally do.  Being sick. Pre-diabetes  This cannot be ignored. Before people develop Type 2 diabetes, they almost always have "pre-diabetes." This is when your blood glucose levels are higher than normal, but not yet high enough to be diagnosed as diabetes. Research has shown that some long-term damage to the body, especially the heart and circulatory system, may already be occurring during pre-diabetes. If you take action to manage your blood glucose when you have pre-diabetes, you may delay or prevent Type 2 diabetes from developing. Stress  If you have diabetes, you may be "diet" controlled or on oral medications or insulin to control your diabetes. However, you may find that your blood glucose is higher than usual in the hospital whether you have diabetes or not. This is often referred to as "stress hyperglycemia." Stress can elevate your blood glucose. This happens because of hormones put out by the body during times of stress. If stress has been the cause of your high blood glucose, it can be followed regularly by your  caregiver. That way he/she can make sure your hyperglycemia does not continue to get worse or progress to diabetes. Steroids  Steroids are medications that act on the infection fighting system (immune system) to block inflammation or infection. One side effect can be a rise in blood glucose. Most people can produce enough extra insulin to allow for this rise, but for those who cannot, steroids make blood glucose levels go even higher. It is not unusual for steroid treatments to "uncover" diabetes that is developing. It is not always possible to determine if the hyperglycemia will go away after the steroids are stopped. A special blood test called an A1c is sometimes done to determine if your blood glucose was elevated before the steroids were started. SYMPTOMS  Thirsty.  Frequent urination.  Dry mouth.  Blurred vision.  Tired or fatigue.  Weakness.  Sleepy.  Tingling in feet or leg. DIAGNOSIS  Diagnosis is made by monitoring blood glucose in one or all of the following ways:  A1c test. This is a chemical found in your blood.  Fingerstick blood glucose monitoring.  Laboratory results. TREATMENT  First, knowing the cause of the hyperglycemia is important before the hyperglycemia can be treated. Treatment may include, but is not be limited to:  Education.  Change or adjustment in medications.  Change or adjustment in meal plan.  Treatment for an illness, infection, etc.  More frequent blood glucose monitoring.  Change in exercise plan.  Decreasing or stopping steroids.  Lifestyle changes. HOME CARE INSTRUCTIONS   Test your blood glucose as directed.  Exercise regularly. Your caregiver will give you instructions about  exercise. Pre-diabetes or diabetes which comes on with stress is helped by exercising.  Eat wholesome, balanced meals. Eat often and at regular, fixed times. Your caregiver or nutritionist will give you a meal plan to guide your sugar intake.  Being at  an ideal weight is important. If needed, losing as little as 10 to 15 pounds may help improve blood glucose levels. SEEK MEDICAL CARE IF:   You have questions about medicine, activity, or diet.  You continue to have symptoms (problems such as increased thirst, urination, or weight gain). SEEK IMMEDIATE MEDICAL CARE IF:   You are vomiting or have diarrhea.  Your breath smells fruity.  You are breathing faster or slower.  You are very sleepy or incoherent.  You have numbness, tingling, or pain in your feet or hands.  You have chest pain.  Your symptoms get worse even though you have been following your caregiver's orders.  If you have any other questions or concerns. Document Released: 02/11/2001 Document Revised: 11/10/2011 Document Reviewed: 12/15/2011 [facility] Patient Information 2014 [place], [place].  Deep Vein Thrombosis A deep vein thrombosis (DVT) is a blood clot that develops in the deep, larger veins of the leg, arm, or pelvis. These are more dangerous than clots that might form in veins near the surface of the body. A DVT can lead to complications if the clot breaks off and travels in the bloodstream to the lungs.  A DVT can damage the valves in your leg veins, so that instead of flowing upward, the blood pools in the lower leg. This is called post-thrombotic syndrome, and it can result in pain, swelling, discoloration, and sores on the leg. CAUSES Usually, several things contribute to blood clots forming. Contributing factors include:  The flow of blood slows down.  The inside of the vein is damaged in some way.  You have a condition that makes blood clot more easily. RISK FACTORS Some people are more likely than others to develop blood clots. Risk factors include:   Older age, especially over 47 years of age.  Having a family history of blood clots or if you have already had a blot clot.  Having major or lengthy surgery. This is especially true for surgery on the  hip, knee, or belly (abdomen). Hip surgery is particularly high risk.  Breaking a hip or leg.  Sitting or lying still for a long time. This includes long-distance travel, paralysis, or recovery from an illness or surgery.  Having cancer or cancer treatment.  Having a long, thin tube (catheter) placed inside a vein during a medical procedure.  Being overweight (obese).  Pregnancy and childbirth.  Hormone changes make the blood clot more easily during pregnancy.  The fetus puts pressure on the veins of the pelvis.  There is a risk of injury to veins during delivery or a caesarean. The risk is highest just after childbirth.  Medicines with the male hormone estrogen. This includes birth control pills and hormone replacement therapy.  Smoking.  Other circulation or heart problems.  SIGNS AND SYMPTOMS When a clot forms, it can either partially or totally block the blood flow in that vein. Symptoms of a DVT can include:  Swelling of the leg or arm, especially if one side is much worse.  Warmth and redness of the leg or arm, especially if one side is much worse.  Pain in an arm or leg. If the clot is in the leg, symptoms may be more noticeable or worse when standing or walking.  The symptoms of a DVT that has traveled to the lungs (pulmonary embolism, PE) usually start suddenly and include:  Shortness of breath.  Coughing.  Coughing up blood or blood-tinged phlegm.  Chest pain. The chest pain is often worse with deep breaths.  Rapid heartbeat. Anyone with these symptoms should get emergency medical treatment right away. Call your local emergency services (911 in the [place] if you have these symptoms. DIAGNOSIS If a DVT is suspected, your health care provider will take a full medical history and perform a physical exam. Tests that also may be required include:  Blood tests, including studies of the clotting properties of the blood.  Ultrasonography to see if you have clots  in your legs or lungs.  X-rays to show the flow of blood when dye is injected into the veins (venography).  Studies of your lungs if you have any chest symptoms. PREVENTION  Exercise the legs regularly. Take a brisk 30-minute walk every day.  Maintain a weight that is appropriate for your height.  Avoid sitting or lying in bed for long periods of time without moving your legs.  Women, particularly those over the age of 79 years, should consider the risks and benefits of taking estrogen medicines, including birth control pills.  Do not smoke, especially if you take estrogen medicines.  Long-distance travel can increase your risk of DVT. You should exercise your legs by walking or pumping the muscles every hour.  In-hospital prevention:  Many of the risk factors above relate to situations that exist with hospitalization, either for illness, injury, or elective surgery.  Your health care provider will assess you for the need for venous thromboembolism prophylaxis when you are admitted to the hospital. If you are having surgery, your surgeon will assess you the day of or day after surgery.  Prevention may include medical and nonmedical measures. TREATMENT Once identified, a DVT can be treated. It can also be prevented in some circumstances. Once you have had a DVT, you may be at increased risk for a DVT in the future. The most common treatment for DVT is blood thinning (anticoagulant) medicine, which reduces the blood's tendency to clot. Anticoagulants can stop new blood clots from forming and stop old ones from growing. They cannot dissolve existing clots. Your body does this by itself over time. Anticoagulants can be given by mouth, by IV access, or by injection. Your health care provider will determine the best program for you. Other medicines or treatments that may be used are:  Heparin or related medicines (low molecular weight heparin) are usually the first treatment for a blood clot.  They act quickly. However, they cannot be taken orally.  Heparin can cause a fall in a component of blood that stops bleeding and forms blood clots (platelets). You will be monitored with blood tests to be sure this does not occur.  Warfarin is an anticoagulant that can be swallowed. It takes a few days to start working, so usually heparin or related medicines are used in combination. Once warfarin is working, heparin is usually stopped.  Less commonly, clot dissolving drugs (thrombolytics) are used to dissolve a DVT. They carry a high risk of bleeding, so they are used mainly in severe cases, where your life or a limb is threatened.  Very rarely, a blood clot in the leg needs to be removed surgically.  If you are unable to take anticoagulants, your health care provider may arrange for you to have a filter placed in a main  vein in your abdomen. This filter prevents clots from traveling to your lungs. HOME CARE INSTRUCTIONS  Take all medicines prescribed by your health care provider. Only take over-the-counter or prescription medicines for pain, fever, or discomfort as directed by your health care provider.  Warfarin. Most people will continue taking warfarin after hospital discharge. Your health care provider will advise you on the length of treatment (usually 3 6 months, sometimes lifelong).  Too much and too little warfarin are both dangerous. Too much warfarin increases the risk of bleeding. Too little warfarin continues to allow the risk for blood clots. While taking warfarin, you will need to have regular blood tests to measure your blood clotting time. These blood tests usually include both the prothrombin time (PT) and international normalized ratio (INR) tests. The PT and INR results allow your health care provider to adjust your dose of warfarin. The dose can change for many reasons. It is critically important that you take warfarin exactly as prescribed, and that you have your PT and INR  levels drawn exactly as directed.  Many foods, especially foods high in vitamin K, can interfere with warfarin and affect the PT and INR results. Foods high in vitamin K include spinach, kale, broccoli, cabbage, collard and turnip greens, brussel sprouts, peas, cauliflower, seaweed, and parsley as well as beef and pork liver, green tea, and soybean oil. You should eat a consistent amount of foods high in vitamin K. Avoid major changes in your diet, or notify your health care provider before changing your diet. Arrange a visit with a dietitian to answer your questions.  Many medicines can interfere with warfarin and affect the PT and INR results. You must tell your health care provider about any and all medicines you take. This includes all vitamins and supplements. Be especially cautious with aspirin and anti-inflammatory medicines. Ask your health care provider before taking these. Do not take or discontinue any prescribed or over-the-counter medicine except on the advice of your health care provider or pharmacist.  Warfarin can have side effects, primarily excessive bruising or bleeding. You will need to hold pressure over cuts for longer than usual. Your health care provider or pharmacist will discuss other potential side effects.  Alcohol can change the body's ability to handle warfarin. It is best to avoid alcoholic drinks or consume only very small amounts while taking warfarin. Notify your health care provider if you change your alcohol intake.  Notify your dentist or other health care providers before procedures.  Activity. Ask your health care provider how soon you can go back to normal activities. It is important to stay active to prevent blood clots. If you are on anticoagulant medicine, avoid contact sports.  Exercise. It is very important to exercise. This is especially important while traveling, sitting, or standing for long periods of time. Exercise your legs by walking or by pumping the  muscles frequently. Take frequent walks.  Compression stockings. These are tight elastic stockings that apply pressure to the lower legs. This pressure can help keep the blood in the legs from clotting. You may need to wear compression stockings at home to help prevent a DVT.  Do not smoke. If you smoke, quit. Ask your health care provider for help with quitting smoking.  Learn as much as you can about DVT. Knowing more about the condition should help you keep it from coming back.  Wear a medical alert bracelet or carry a medical alert card. SEEK MEDICAL CARE IF:  You notice  a rapid heartbeat.  You feel weaker or more tired than usual.  You feel faint.  You notice increased bruising.  You feel your symptoms are not getting better in the time expected.  You believe you are having side effects of medicine. SEEK IMMEDIATE MEDICAL CARE IF:  You have chest pain.  You have trouble breathing.  You have new or increased swelling or pain in one leg.  You cough up blood.  You notice blood in vomit, in a bowel movement, or in urine. MAKE SURE YOU:  Understand these instructions.  Will watch your condition.  Will get help right away if you are not doing well or get worse. Document Released: 08/18/2005 Document Revised: 06/08/2013 Document Reviewed: 04/25/2013 [facility] Patient Information 2014 [place].

## 2013-10-19 NOTE — ED Notes (Signed)
Pt. Was seen by PCP and told his BS was high. Pt. Was sent here for further evaluation. Pt. Is also here to have an [name] of his left leg.

## 2013-10-19 NOTE — ED Provider Notes (Signed)
Medical screening examination/treatment/procedure(s) were performed by non-physician practitioner and as supervising physician I was immediately available for consultation/collaboration.  EKG Interpretation   None         Hoy Morn, MD 10/19/13 1043

## 2013-10-19 NOTE — Progress Notes (Signed)
   Subjective:    Patient ID: Tommy Yang, male    DOB: 06/21/1951, 63 y.o.   MRN: [id number]  HPI 63 year old male patient present here for a follow up with left leg DVT. He has an appointment made with vein and vascular specialist for further testing on March 5th.  The clot ran from his groin down to his lower leg. He says the medication he recently started makes him have to urinate more frequent than usual. He says he has a lot of itching from his mid tib fib down to the ankle. He has pain when you put a lot of pressure on his lower leg. Has had no SOB or chest pain.    Review of Systems     Objective:   Physical Exam patient does have significant swelling in the left calf. There is some peeling and scaling of the skin of his left calf. He does appear to be a quart palpable in the left inguinal area. His cardiorespiratory exam is normal his lungs are clear to auscultation and percussion        Assessment & Plan:   patient was seen 3 weeks ago with an extensive DVT of the left leg. He is currently on Xarelto therapy. He currently is on 20 mg a day. On reviewing the notes he had an elevated PSA this summer of 6.18 and an A1c of 10.7. Neither of these problems have been evaluated. He needs to get started on diabetes control. I am very concerned he may have a hypercoagulable state related to prostate issues. He needs referral to urology for evaluation and consideration for biopsy. Being on blood thinner will be an issue for consideration of a biopsy . At the present time he needs a repeat Doppler study of the left leg and further evaluation of his elevated sugar which are machine was too high to calculate. If he is having propagation of clot consideration to switching to Lovenox, Coumadin to be considered.

## 2013-10-19 NOTE — ED Provider Notes (Addendum)
CSN: [id number]     Arrival date & time 10/19/13  0902 History   First MD Initiated Contact with Patient 10/19/13 [phone]     Chief Complaint  Patient presents with  . Hyperglycemia     (Consider location/radiation/quality/duration/timing/severity/associated sxs/prior Treatment) HPI Comments: Patient is a 63 year old male with a past medical history of DVT, pre-diabetes or hypertension who presents to the emergency department from his primary care physician Dr. Perfecto Kingdom office with concerns of hyperglycemia and DVT. Patient was diagnosed with DVT on January 21 in his left leg, treated with Xarelto 20 mg. on chart review, it is noted that Dr. Everlene Farrier is concerned about xarelto therapy failure, suggest repeat lower extremity duplex, possibly switching to Lovenox or Coumadin. He is concerned patient is in a hypercoagulable state due to prostate issues which he will see urology for. Patient states his left leg swelling has significantly decreased about 90%. States he only has pain when he presses really hard on the area. At today's office visit, patient's blood sugar was elevated greater than 444. He does not take any medications for diabetes, no official diagnosis. Patient admits to increased urination and thirst, otherwise denies abdominal pain, nausea, vomiting, diarrhea, fatigue, chest pain or shortness of breath. He is scheduled to see vascular surgeon Dr. Bridgett Larsson on 3/5.  Patient is a 63 y.o. male presenting with hyperglycemia. The history is provided by the patient and medical records.  Hyperglycemia Associated symptoms: increased thirst and polyuria     Past Medical History  Diagnosis Date  . Hypertension   . Diabetes mellitus without complication   . DVT (deep venous thrombosis)    Past Surgical History  Procedure Laterality Date  . Appendectomy    . Spine surgery     Family History  Problem Relation Age of Onset  . Heart disease Mother   . Diabetes Mother   . Heart disease Father   .  Diabetes Father    History  Substance Use Topics  . Smoking status: Former Research scientist (life sciences)  . Smokeless tobacco: Never Used  . Alcohol Use: No    Review of Systems  Cardiovascular: Positive for leg swelling.  Endocrine: Positive for polydipsia and polyuria.  All other systems reviewed and are negative.      Allergies  Review of patient's allergies indicates no known allergies.  Home Medications   Current Outpatient Rx  Name  Route  Sig  Dispense  Refill  . Rivaroxaban (XARELTO) 20 MG TABS tablet   Oral   Take 1 tablet (20 mg total) by mouth daily with supper.   30 tablet   4     To begin after finishing the 15 mg twice daily 3 w ...   . metFORMIN (GLUCOPHAGE) 500 MG tablet   Oral   Take 1 tablet (500 mg total) by mouth 2 (two) times daily with a meal.   60 tablet   0    BP 166/85  Pulse 71  Temp(Src) 97.7 F (36.5 C) (Oral)  Resp 18  SpO2 99% Physical Exam  Nursing note and vitals reviewed. Constitutional: He is oriented to person, place, and time. He appears well-developed and well-nourished. No distress.  HENT:  Head: Normocephalic and atraumatic.  Mouth/Throat: Oropharynx is clear and moist. Mucous membranes are dry.  Eyes: Conjunctivae are normal.  Neck: Normal range of motion. Neck supple.  Cardiovascular: Normal rate, regular rhythm and normal heart sounds.   Pulses:      Posterior tibial pulses are 1+ on the left  side.  Pulmonary/Chest: Effort normal and breath sounds normal.  Abdominal: Soft. Bowel sounds are normal. There is no tenderness.  Musculoskeletal: Normal range of motion. He exhibits no edema.  Swelling noted to left lower extremity, not significant. TTP to medial aspect of left calf with deep palpation only. Palpable cord left medial thigh, non-tender.  Neurological: He is alert and oriented to person, place, and time.  Skin: Skin is warm and dry. He is not diaphoretic.  Psychiatric: He has a normal mood and affect. His behavior is normal.     ED Course  Procedures (including critical care time) Labs Review Labs Reviewed  BASIC METABOLIC PANEL - Abnormal; Notable for the following:    Sodium 133 (*)    Glucose, Bld 493 (*)    All other components within normal limits  PROTIME-INR - Abnormal; Notable for the following:    Prothrombin Time 18.2 (*)    INR 1.55 (*)    All other components within normal limits  GLUCOSE, CAPILLARY - Abnormal; Notable for the following:    Glucose-Capillary 470 (*)    All other components within normal limits  CBC   Imaging Review No results found.  EKG Interpretation   None       MDM   Final diagnoses:  Hyperglycemia   Pt sent from PCP for LE duplex, evaluation of hyperglycemia. He is well appearing and in NAD. VSS. Pt stating he does not want to stay in the hospital. Labs pending. Will obtain LE duplex. Had urine dipstick at PCP with glucose of 500, cbg at PCP >444. 10:33 AM Lower extremity duplex result: Left lower extremity venous duplex completed. Left: DVT noted in the common femoral, femoral, popliteal, gastrocnemius, and posterior tibial veins. No evidence of superficial thrombosis. No Baker's cyst. Right: Negative for DVT in the common femoral vein.  Glucose 493. Patient given 1 L fluid bolus. He remains asymptomatic and well appearing. He is stable for discharge, will start on Metformin, advised him to stay well-hydrated. He has followup next week with PCP, beginning of next month with vascular surgeon. Stable for d/c. Return precautions given. Patient states understanding of treatment care plan and is agreeable. Case discussed with attending Dr. Venora Maples who agrees with plan of care. 12:37 PM Addendum- no anion gap.  Illene Labrador, PA-C 10/19/13 Ali Chuk, PA-C 10/19/13 [phone]

## 2013-10-19 NOTE — Progress Notes (Signed)
Left lower extremity venous duplex completed.  Left: DVT noted in the common femoral, femoral, popliteal, gastrocnemius, and posterior tibial veins.  No evidence of superficial thrombosis.  No Baker's cyst.  Right:  Negative for DVT in the common femoral vein.

## 2013-10-20 ENCOUNTER — Telehealth: Payer: Self-pay | Admitting: *Deleted

## 2013-10-20 NOTE — Telephone Encounter (Signed)
Pt advised per Dr. Everlene Farrier to seek care if he has any chest pain or shortness of breath at the ER or here and to follow up with Dr. Linna Darner next wed.  Pt stated that he is now on medication for diabetes.

## 2013-10-24 ENCOUNTER — Encounter: Payer: Self-pay | Admitting: Vascular Surgery

## 2013-10-25 ENCOUNTER — Ambulatory Visit (INDEPENDENT_AMBULATORY_CARE_PROVIDER_SITE_OTHER): Payer: BC Managed Care – PPO | Admitting: Vascular Surgery

## 2013-10-25 ENCOUNTER — Encounter: Payer: Self-pay | Admitting: Vascular Surgery

## 2013-10-25 VITALS — BP 149/75 | HR 66 | Ht 71.0 in | Wt 285.0 lb

## 2013-10-25 DIAGNOSIS — I82409 Acute embolism and thrombosis of unspecified deep veins of unspecified lower extremity: Secondary | ICD-10-CM

## 2013-10-25 NOTE — Progress Notes (Signed)
Patient name: Tommy Yang MRN: [id number] DOB: August 24, 1951 Sex: male   Referred by: Everlene Farrier  Reason for referral:  Chief Complaint  Patient presents with  . New Evaluation    requested ASAP eval for blood clot    HISTORY OF PRESENT ILLNESS: Patient has today for evaluation of recent diagnosis of left leg DVT. He has no prior history of any hypercoagulability specifically no DVT or no cardiac events. He does have history of hypertension and diabetes which had been diet-controlled. He noted swelling on January 17 of this year and went to [facility] for a venous duplex which revealed extensive DVT in his left leg beginning in the proximal calf extending up to the common femoral vein. The profundus femoris vein was occluded as well. The patient was appropriately treated with Xarelto. He continues to have swelling and that recently underwent a repeat DVT which showed no progression and actually did show some resolution of his profundus vein DVT. The patient reports that initially he had marked swelling but this has resolved as does report some erectile dysfunction after being on Xarelto. I explained that did not note any association. well. He does notice some tightness with prolonged standing. He has been compliant with his thigh high 20-30 mm mercury compression garments. He has no symptoms referable to pulmonary embolus. He is seen today for further discussion. He also has a recent finding of elevated PSA and is undergoing consultation upcoming with urology.  Past Medical History  Diagnosis Date  . Hypertension   . Diabetes mellitus without complication   . DVT (deep venous thrombosis)     Past Surgical History  Procedure Laterality Date  . Appendectomy    . Spine surgery      History   Social History  . Marital Status: Married    Spouse Name: N/A    Number of Children: N/A  . Years of Education: N/A   Occupational History  . Not on file.   Social History Main Topics  .  Smoking status: Former Smoker    Quit date: 08/01/2013  . Smokeless tobacco: Never Used  . Alcohol Use: No  . Drug Use: No  . Sexual Activity: Yes    Birth Control/ Protection: Abstinence   Other Topics Concern  . Not on file   Social History Narrative  . No narrative on file    Family History  Problem Relation Age of Onset  . Heart disease Mother     before age 48  . Diabetes Mother   . Hyperlipidemia Mother   . Varicose Veins Mother   . Heart disease Father   . Diabetes Father     Allergies as of 10/25/2013  . (No Known Allergies)    Current Outpatient Prescriptions on File Prior to Visit  Medication Sig Dispense Refill  . metFORMIN (GLUCOPHAGE) 500 MG tablet Take 1 tablet (500 mg total) by mouth 2 (two) times daily with a meal.  60 tablet  0  . Rivaroxaban (XARELTO) 20 MG TABS tablet Take 1 tablet (20 mg total) by mouth daily with supper.  30 tablet  4   No current facility-administered medications on file prior to visit.     REVIEW OF SYSTEMS:  Positives indicated with an "X"  CARDIOVASCULAR:  [ ]  chest pain   [ ]  chest pressure   [ ]  palpitations   [ ]  orthopnea   [ ]  dyspnea on exertion   [ ]  claudication   [ ]  rest  pain   [x ] DVT   [ ]  phlebitis PULMONARY:   [ ]  productive cough   [ ]  asthma   [ ]  wheezing NEUROLOGIC:   [ ]  weakness  [ ]  paresthesias  [ ]  aphasia  [ ]  amaurosis  [ ]  dizziness HEMATOLOGIC:   [ ]  bleeding problems   [ ]  clotting disorders MUSCULOSKELETAL:  [ ]  joint pain   [ ]  joint swelling GASTROINTESTINAL: [ ]   blood in stool  [ ]   hematemesis GENITOURINARY:  [ ]   dysuria  [ ]   hematuria PSYCHIATRIC:  [ ]  history of major depression INTEGUMENTARY:  [ ]  rashes  [ ]  ulcers CONSTITUTIONAL:  [ ]  fever   [ ]  chills  PHYSICAL EXAMINATION:  General: The patient is a well-nourished male, in no acute distress. Vital signs are BP 149/75  Pulse 66  Ht 5\' 11"  (1.803 m)  Wt 285 lb (129.275 kg)  BMI 39.77 kg/m2  SpO2 95% Pulmonary: There is  a good air exchange   Musculoskeletal: There are no major deformities. No swelling Neurologic: No focal weakness or paresthesias are detected, Skin: Some dry skin scaling around his left ankle but no ulceration and no hemosiderin deposits Psychiatric: The patient has normal affect. Cardiovascular: 2+ dorsalis pedis pulses bilaterally      Impression and Plan:  DVT left leg which is extensive from the common femoral vein down to the calf. No street of trauma, hypercoagulability or prolong stasis. Is undergoing GU workup with elevated PSA. I discussed that I feel that he is at completely appropriate treatment it would recommend continued six-month course of the Xarelto. I explained that I would not repeat ultrasound unless he has new symptoms. I explained that he will overtime spontaneously lyse the clot. I explained some (complete clearing and sometimes there is no clearing at all with continued chronic occlusive thrombus. Since he has had marked resolution of the swelling hopefully he will continue to have resolution of this. I did explain the critical importance of lifelong compression garment usage. We have fitted in today with knee-high compression as well. I feel he will able to be able to have better compliance of this. I have recommended continued back impression for an additional month and then switching back to knee-high. He was reassured this discussion will see [name] again on an as-needed basis    Alnisa Hasley [facility] Office: [phone]

## 2013-10-26 ENCOUNTER — Ambulatory Visit (INDEPENDENT_AMBULATORY_CARE_PROVIDER_SITE_OTHER): Payer: BC Managed Care – PPO | Admitting: Emergency Medicine

## 2013-10-26 VITALS — BP 130/68 | HR 73 | Temp 97.8°F | Resp 18 | Ht 71.0 in | Wt 285.0 lb

## 2013-10-26 DIAGNOSIS — N529 Male erectile dysfunction, unspecified: Secondary | ICD-10-CM | POA: Insufficient documentation

## 2013-10-26 DIAGNOSIS — R972 Elevated prostate specific antigen [PSA]: Secondary | ICD-10-CM

## 2013-10-26 DIAGNOSIS — E119 Type 2 diabetes mellitus without complications: Secondary | ICD-10-CM

## 2013-10-26 LAB — MICROALBUMIN, URINE: Microalb, Ur: 45.8 mg/dL — ABNORMAL HIGH (ref 0.00–1.89)

## 2013-10-26 LAB — GLUCOSE, POCT (MANUAL RESULT ENTRY): POC Glucose: 363 mg/dl — AB (ref 70–99)

## 2013-10-26 MED ORDER — SILDENAFIL CITRATE 100 MG PO TABS: 50.0000 mg | ORAL_TABLET | Freq: Every day | ORAL | Status: DC | PRN

## 2013-10-26 MED ORDER — ATORVASTATIN CALCIUM 40 MG PO TABS: 40.0000 mg | ORAL_TABLET | Freq: Every day | ORAL | Status: DC

## 2013-10-26 MED ORDER — METFORMIN HCL 1000 MG PO TABS: 1000.0000 mg | ORAL_TABLET | Freq: Two times a day (BID) | ORAL | Status: DC

## 2013-10-26 NOTE — Progress Notes (Addendum)
Subjective:    Patient ID: Tommy Yang, male    DOB: Aug 08, 1951, 63 y.o.   MRN: [id number] This chart was scribed for Darlyne Russian, MD by Anastasia Pall, ED Scribe. This patient was seen in room 10 and the patient's care was started at 7:50 AM.  Chief Complaint  Patient presents with  . Follow-up    dm and labs     HPI Tommy Yang is a 63 y.o. male Pt presents for follow of his DM and lab results. He has h/o major DVT in his left inner leg.  He states his urinary frequency has subsided some. His prostate levels are up, has enlarged prostate, and he is wondering what the next steps are. He states he was diagnosed with DM at his last physical here. His sugar levels have been fluctuating between 263 and 216. He states the swelling in his LE has significantly subsided. He is receptive to being put on cholesterol medication. He denies chest pain, SOB, dizziness, and any other associated symptoms. He is also wondering about his sexual activity, wondering what medication he can take for ED.   PCP - No PCP Per Patient  Patient Active Problem List   Diagnosis Date Noted  . Diabetes 10/19/2013  . DVT (deep venous thrombosis)   . Left leg DVT 09/28/2013  . Leg edema, left 09/28/2013   Past Medical History  Diagnosis Date  . Hypertension   . Diabetes mellitus without complication   . DVT (deep venous thrombosis)    Past Surgical History  Procedure Laterality Date  . Appendectomy    . Spine surgery     No Known Allergies Prior to Admission medications   Medication Sig Start Date End Date Taking? Authorizing Provider  metFORMIN (GLUCOPHAGE) 500 MG tablet Take 1 tablet (500 mg total) by mouth 2 (two) times daily with a meal. 10/19/13  Yes Illene Labrador, PA-C  Rivaroxaban (XARELTO) 20 MG TABS tablet Take 1 tablet (20 mg total) by mouth daily with supper. 09/28/13  Yes Posey Boyer, MD   Review of Systems  Constitutional: Negative for fever.  Respiratory: Negative for  shortness of breath.   Cardiovascular: Positive for leg swelling. Negative for chest pain.  Genitourinary: Positive for frequency and penile swelling.  Neurological: Negative for dizziness.      Objective:   Physical Exam Nursing note and vitals reviewed. Constitutional: Pt is oriented to person, place, and time. Pt appears well-developed and well-nourished. No distress.  HENT: Right TM nl. Left TM nl. Oropharynx clear and moist, no exudate. Nose nl.  Head: Normocephalic and atraumatic.  Eyes: EOM are normal. Pupils are equal, round, and reactive to light.  Neck: Neck supple. No thyromegaly. No cervical adenopathy.  Cardiovascular: Normal rate, regular rhythm and normal heart sounds.  Exam reveals no gallop and no friction rub. No murmur heard. Pulmonary/Chest: Effort normal and breath sounds normal. No respiratory distress. Pt has no wheezes. Pt has no rales.  Abdominal: Soft. Bowel sounds are normal. There is no tenderness. There is no rebound and no guarding. No hepatosplenomegaly. No CVA tenderness.  Musculoskeletal: Normal range of motion. No tenderness. No edema.   Neurological: Pt is alert and oriented to person, place, and time.  Skin: Skin is warm and dry.  Psychiatric: Pt has a normal mood and affect. Pt's behavior is normal.  There is a palpable clot in the left femoral vein. There is 2+ swelling of the left leg. There is some scaling of the  skin in this area BP 130/68  Pulse 73  Temp(Src) 97.8 F (36.6 C) (Oral)  Resp 18  Ht 5\' 11"  (1.803 m)  Wt 285 lb (129.275 kg)  BMI 39.77 kg/m2  SpO2 97%  Results for orders placed in visit on 10/26/13  GLUCOSE, POCT (MANUAL RESULT ENTRY)      Result Value Ref Range   POC Glucose 363 (*) 70 - 99 mg/dl        Assessment & Plan:  Will start patient on Lipitor 40 one a day. He is on Xarelto  for his DVT so we'll not add aspirin at the present time.I started him on metformin on his last visit and we'll increase this to 1 g twice a  day. He is to continue to check his sugars every day. Recheck in one month he was also given Viagra for recent problems with ED.  **Disclaimer: This note was dictated with voice recognition software. Similar sounding words can inadvertently be transcribed and this note may contain transcription errors which may not have been corrected upon publication of note.**   I personally performed the services described in this documentation, which was scribed in my presence. The recorded information has been reviewed and is accurate.

## 2013-10-26 NOTE — Patient Instructions (Addendum)
Check your sugar every day. Continue your blood thinner. You will he taking metformin twice a day and Lipitor one a day. Recheck in our office in one month

## 2013-10-27 ENCOUNTER — Telehealth: Payer: Self-pay

## 2013-10-27 MED ORDER — LOSARTAN POTASSIUM 50 MG PO TABS: 50.0000 mg | ORAL_TABLET | Freq: Every day | ORAL | Status: DC

## 2013-10-27 NOTE — Addendum Note (Signed)
Addended by: Jethro Bolus A on: 10/27/2013 10:58 AM   Modules accepted: Orders

## 2013-10-27 NOTE — Telephone Encounter (Signed)
Patient requesting to talk with Dr. Everlene Farrier regarding the results from his latest tests.   [phone]

## 2013-10-27 NOTE — Telephone Encounter (Signed)
Pt concerned that the medication was incorrect. Assured pt that the medication sent to the pharmacy was the correct one. Pt understands that the medication is to help protect his kidneys from further injury. Pt was concerned that his kidneys are failing. Assured him that he just has some damage but they are still functioning.

## 2013-11-01 NOTE — Telephone Encounter (Signed)
Pt states that [facility] would like for [name] to call them regarding his rx xarelto, they need someone to state that pt needs this medication and why. Please advise. [phone] he states that they need to be called by April 1st. Best [phone]

## 2013-11-03 ENCOUNTER — Encounter: Payer: Self-pay | Admitting: Vascular Surgery

## 2013-11-17 NOTE — Telephone Encounter (Signed)
Gave diagnoses code to [facility]. They just needed a diagnoses code to associate.

## 2013-11-30 ENCOUNTER — Ambulatory Visit (INDEPENDENT_AMBULATORY_CARE_PROVIDER_SITE_OTHER): Payer: BC Managed Care – PPO | Admitting: Emergency Medicine

## 2013-11-30 VITALS — BP 124/66 | HR 72 | Temp 97.3°F | Resp 16 | Ht 71.0 in | Wt 285.0 lb

## 2013-11-30 DIAGNOSIS — R972 Elevated prostate specific antigen [PSA]: Secondary | ICD-10-CM

## 2013-11-30 DIAGNOSIS — I82409 Acute embolism and thrombosis of unspecified deep veins of unspecified lower extremity: Secondary | ICD-10-CM

## 2013-11-30 DIAGNOSIS — I10 Essential (primary) hypertension: Secondary | ICD-10-CM

## 2013-11-30 DIAGNOSIS — E119 Type 2 diabetes mellitus without complications: Secondary | ICD-10-CM

## 2013-11-30 LAB — GLUCOSE, POCT (MANUAL RESULT ENTRY): POC Glucose: 380 mg/dl — AB (ref 70–99)

## 2013-11-30 MED ORDER — SITAGLIPTIN PHOSPHATE 100 MG PO TABS: 100.0000 mg | ORAL_TABLET | Freq: Every day | ORAL | Status: DC

## 2013-11-30 NOTE — Progress Notes (Deleted)
° °  Subjective:    Patient ID: Tommy Yang, male    DOB: Aug 14, 1951, 63 y.o.   MRN: [id number] This chart was scribed for Darlyne Russian, MD by Anastasia Pall, ED Scribe. This patient was seen in room 10 and the patient's care was started at 7:51 AM.  Chief Complaint  Patient presents with   Follow-up    Blood clot and medications   HPI Tommy Yang is a 63 y.o. male Pt has h/o left LE DVT, presents to the [facility] for a recheck and medication refill.  He reports his LE are doing better, states his pain and LE swelling has improved some. He still reports LE tightness. He denies any new LE complaints.   He states he is having a hard time with his DM control. He reports reducing sugar intake, but is still having trouble keeping his levels down. He reports an appointment next Tuesday, 04/07.   He denies coughing up blood, SOB, chest pain, dizziness, and any other associated symptoms.   PCP - No PCP Per Patient  Review of Systems  Constitutional: Negative for fever.  Respiratory: Negative for cough and shortness of breath.   Cardiovascular: Positive for leg swelling (baseline). Negative for chest pain.  Musculoskeletal: Positive for arthralgias (left LE, baseline).  Neurological: Negative for dizziness.   BP 124/66   Pulse 72   Temp(Src) 97.3 F (36.3 C) (Oral)   Resp 16   Ht 5\' 11"  (1.803 m)   Wt 285 lb (129.275 kg)   BMI 39.77 kg/m2   SpO2 95%     Objective:   Physical Exam Nursing note and vitals reviewed. Constitutional: He is oriented to person, place, and time. He appears well-developed and well-nourished. No distress.  HENT:  Head: Normocephalic and atraumatic.  Eyes: EOM are normal.  Neck: Neck supple. No tracheal deviation present.  Cardiovascular: Normal rate, regular rhythm, normal heart sounds and intact distal pulses.   No murmur heard.  Pulmonary/Chest: Effort normal and breath sounds normal. No respiratory distress. He has no wheezes. He has no rales.    Musculoskeletal: Normal range of motion. 45 cm bilaterally measured at 12 cm below inferior pole of patella. Palpable clot in left inguinal area.   Neurological: He is alert and oriented to person, place, and time.  Skin: Skin is warm and dry.  Psychiatric: He has a normal mood and affect. His behavior is normal.   BP recheck right arm 138/78    Results for orders placed in visit on 11/30/13  GLUCOSE, POCT (MANUAL RESULT ENTRY)      Result Value Ref Range   POC Glucose 380 (*) 70 - 99 mg/dl    Assessment & Plan:

## 2013-11-30 NOTE — Progress Notes (Signed)
   Subjective:    Patient ID: Tommy Yang, male    DOB: December 19, 1950, 63 y.o.   MRN: [id number] This chart was scribed for Darlyne Russian, MD by Anastasia Pall, ED Scribe. This patient was seen in room 10 and the patient's care was started at 7:51 AM.  Chief Complaint  Patient presents with  . Follow-up    Blood clot and medications  . Diabetes   Diabetes Pertinent negatives for hypoglycemia include no dizziness. Pertinent negatives for diabetes include no chest pain.   Tommy Yang is a 63 y.o. male Pt has h/o left LE DVT, presents to the [facility] for a recheck and medication refill.  He reports his LE are doing better, states his pain and LE swelling has improved some. He still reports LE tightness. He denies any new LE complaints.   He states he is having a hard time with his DM control. He reports reducing sugar intake, but is still having trouble keeping his levels down. He reports an appointment next Tuesday, 04/07.   He denies coughing up blood, SOB, chest pain, dizziness, and any other associated symptoms.   PCP - No PCP Per Patient  Review of Systems  Constitutional: Negative for fever.  Respiratory: Negative for cough and shortness of breath.   Cardiovascular: Positive for leg swelling (baseline). Negative for chest pain.  Musculoskeletal: Positive for arthralgias (left LE, baseline).  Neurological: Negative for dizziness.   BP 124/66  Pulse 72  Temp(Src) 97.3 F (36.3 C) (Oral)  Resp 16  Ht 5\' 11"  (1.803 m)  Wt 285 lb (129.275 kg)  BMI 39.77 kg/m2  SpO2 95%     Objective:   Physical Exam Nursing note and vitals reviewed. Constitutional: He is oriented to person, place, and time. He appears well-developed and well-nourished. No distress.  HENT:  Head: Normocephalic and atraumatic.  Eyes: EOM are normal.  Neck: Neck supple. No tracheal deviation present.  Cardiovascular: Normal rate, regular rhythm, normal heart sounds and intact distal pulses.   No murmur  heard.  Pulmonary/Chest: Effort normal and breath sounds normal. No respiratory distress. He has no wheezes. He has no rales.  Musculoskeletal: Normal range of motion. 45 cm bilaterally measured at 12 cm below inferior pole of patella. Palpable clot in left inguinal area.   Neurological: He is alert and oriented to person, place, and time.  Skin: Skin is warm and dry.  Psychiatric: He has a normal mood and affect. His behavior is normal.   BP recheck right arm 138/78    Results for orders placed in visit on 11/30/13  GLUCOSE, POCT (MANUAL RESULT ENTRY)      Result Value Ref Range   POC Glucose 380 (*) 70 - 99 mg/dl    Assessment & Plan:   Sugars not under control. We'll add Januvia 100 mg one a day. He was again encouraged regarding his diet. He will continue on Toprol for his DVT. His leg does not seem as swollen as previous. He is due for a prostate biopsy next week. I am concerned he could have prostate cancer making him hypercoagulable therefore predisposed to develop a clot in his left leg     I personally performed the services described in this documentation, which was scribed in my presence. The recorded information has been reviewed and is accurate.

## 2013-12-06 DIAGNOSIS — C61 Malignant neoplasm of prostate: Secondary | ICD-10-CM

## 2013-12-06 HISTORY — PX: PROSTATE BIOPSY: SHX241

## 2013-12-06 HISTORY — DX: Malignant neoplasm of prostate: C61

## 2013-12-09 ENCOUNTER — Telehealth: Payer: Self-pay

## 2013-12-09 NOTE — Telephone Encounter (Signed)
Outpatient and have him come in tomorrow and if his urine is clean without blood we can restart his Coumadin. Dr. Linna Darner will be here tomorrow 745 until 2

## 2013-12-09 NOTE — Telephone Encounter (Signed)
Patient wants to know when to start his blood thinner back.  He had a biopsy Tuesday, took one pill and had blood in urine.  Has not taken since.   It  is cancerous.   [phone]

## 2013-12-10 NOTE — Telephone Encounter (Signed)
Patient notified and said it is impossible for him to come in today. He will try to come in tomorrow or Monday.

## 2013-12-12 NOTE — Telephone Encounter (Signed)
I am here Tuesday also 2-close.

## 2013-12-13 ENCOUNTER — Telehealth: Payer: Self-pay | Admitting: Radiology

## 2013-12-13 ENCOUNTER — Telehealth: Payer: Self-pay

## 2013-12-13 NOTE — Telephone Encounter (Signed)
Amy spoke to pt today-see other phone message

## 2013-12-13 NOTE — Telephone Encounter (Signed)
Patient has run out of his medication.   States we have to have a prior auth on it with [facility] - [phone]  [facility] [address]   Rivaroxaban (XARELTO) 20 MG TABS tablet   Call patient when done.  [phone]

## 2013-12-13 NOTE — Telephone Encounter (Signed)
Phone call to patient, where is he? How is he? Has he seen urology? He indicates he will come in tomorrow Wednesday. He has appt next Tuesday with the Urologist. He has been advised to resume the blood thinners immediately. He advised he was told to not take the blood thinners until he was seen, so he has not resumed. He will start them today. He has not had any further bleeding. I have advised him to come in tomorrow and to make sure he sees the urologist. He states he will.

## 2013-12-14 ENCOUNTER — Ambulatory Visit (INDEPENDENT_AMBULATORY_CARE_PROVIDER_SITE_OTHER): Payer: BC Managed Care – PPO | Admitting: Emergency Medicine

## 2013-12-14 ENCOUNTER — Telehealth: Payer: Self-pay

## 2013-12-14 VITALS — BP 128/76 | HR 74 | Temp 97.3°F | Resp 16 | Ht 71.0 in | Wt 286.4 lb

## 2013-12-14 DIAGNOSIS — R972 Elevated prostate specific antigen [PSA]: Secondary | ICD-10-CM

## 2013-12-14 DIAGNOSIS — C61 Malignant neoplasm of prostate: Secondary | ICD-10-CM | POA: Insufficient documentation

## 2013-12-14 DIAGNOSIS — R319 Hematuria, unspecified: Secondary | ICD-10-CM

## 2013-12-14 LAB — POCT URINALYSIS DIPSTICK
Bilirubin, UA: NEGATIVE
Glucose, UA: NEGATIVE
Ketones, UA: NEGATIVE
Leukocytes, UA: NEGATIVE
Nitrite, UA: NEGATIVE
Protein, UA: 100
Spec Grav, UA: >=1.03
Urobilinogen, UA: 0.2
pH, UA: 5.5

## 2013-12-14 LAB — POCT UA - MICROSCOPIC ONLY
Casts, Ur, LPF, POC: NEGATIVE
Crystals, Ur, HPF, POC: NEGATIVE
Mucus, UA: POSITIVE
Yeast, UA: NEGATIVE

## 2013-12-14 NOTE — Progress Notes (Signed)
   Subjective:    Patient ID: Tommy Yang, male    DOB: Oct 08, 1950, 63 y.o.   MRN: [id number] This chart was scribed for [name], MD by Terressa Koyanagi, ED Scribe. This patient was seen in room 12  and the patient's care was started at 7:45AM.     HPI  HPI Comments:  Tommy Yang is a 63 y.o. male who presents to the [facility] for a follow up.  Pt was diagnosed with DTV of left leg on 10/2013 and placed on xarelto at that time. Last ultrasound showed improvement in his clot. Pt recently had a prostate biopsy followed by blood in his urine. His blood thinner was placed on hold. He restarted his blood thinner yesterday. Pt has had no further urinary bleeding and pt is following up with urologist next week to discuss treatment of his prostate cancer.   Review of Systems  Cardiovascular: Positive for chest pain.  Musculoskeletal:       Decreased swelling of left leg  All other systems reviewed and are negative.      Objective:   Physical Exam patient is alert and cooperative. Examination of the left leg reveals decreased swelling. He does have significant varicose veins. There is a persistent area of clot in the left femoral area  Results for orders placed in visit on 12/14/13  POCT URINALYSIS DIPSTICK      Result Value Ref Range   Color, UA yellow     Clarity, UA clear     Glucose, UA neg     Bilirubin, UA neg     Ketones, UA neg     Spec Grav, UA >=1.030     Blood, UA large     pH, UA 5.5     Protein, UA 100     Urobilinogen, UA 0.2     Nitrite, UA neg     Leukocytes, UA Negative          Assessment & Plan:  Patient does still have blood in his urine dip. You continue to drink large amounts of fluids I do think he needs to be on the [place] pole and watch his urine closely. I put a call into his urologist but he is out until next week. We'll discuss the situation regarding his prostate cancer at that time

## 2013-12-14 NOTE — Telephone Encounter (Signed)
PA approved through 12/14/14, case # [id number]. Notified pt on VM and pharm.

## 2013-12-14 NOTE — Patient Instructions (Signed)
Please take your Xarelto. Call if you have any bleeding. Have your urologist call me next week after your appointment.

## 2013-12-14 NOTE — Telephone Encounter (Signed)
Patient wife called stated she is returning a phone call. She is not sure if the call was for her or her husband. Stated husband was seen this morning at office. Wife call back number [phone]

## 2013-12-14 NOTE — Telephone Encounter (Signed)
Pt was seen this morning. Dr Everlene Farrier states pt is all set.

## 2013-12-30 ENCOUNTER — Encounter: Payer: Self-pay | Admitting: Radiation Oncology

## 2013-12-30 NOTE — Progress Notes (Signed)
GU Location of Tumor / Histology: prostate  If Prostate Cancer, Gleason Score is (4 + 3) and PSA is (5.55 on 11/10/13)  Patient presented 1 months ago with signs/symptoms of: elevated PSA found during prostate screening  Biopsies of prostate (if applicable) revealed:  11/07/23   Past/Anticipated interventions by urology, if any: biopsy  Past/Anticipated interventions by medical oncology, if any: none  Weight changes, if any:No  Bowel/Bladder complaints, if KNL:ZJQB   Nausea/Vomiting, if any:No  Pain issues, if any:No  SAFETY ISSUES:  Prior radiation? no  Pacemaker/ICD? no  Possible current pregnancy? na  Is the patient on methotrexate? no  Current Complaints / other details:  Married, 1 son, retired, no family history of prostate cancer Dr Louis Meckel discussed Robotic Prostatectomy w/patient. Diabetic Tourette's syndrome

## 2014-01-02 ENCOUNTER — Ambulatory Visit
Admission: RE | Admit: 2014-01-02 | Discharge: 2014-01-02 | Disposition: A | Discharge status: Home / Self Care | Payer: BC Managed Care – PPO | Source: Ambulatory Visit | Attending: Radiation Oncology | Admitting: Radiation Oncology

## 2014-01-02 ENCOUNTER — Encounter: Payer: Self-pay | Admitting: Radiation Oncology

## 2014-01-02 VITALS — BP 144/71 | HR 67 | Temp 98.2°F | Ht 71.0 in | Wt 288.1 lb

## 2014-01-02 DIAGNOSIS — Z79899 Other long term (current) drug therapy: Secondary | ICD-10-CM | POA: Insufficient documentation

## 2014-01-02 DIAGNOSIS — I10 Essential (primary) hypertension: Secondary | ICD-10-CM | POA: Insufficient documentation

## 2014-01-02 DIAGNOSIS — Z7901 Long term (current) use of anticoagulants: Secondary | ICD-10-CM | POA: Insufficient documentation

## 2014-01-02 DIAGNOSIS — Z86718 Personal history of other venous thrombosis and embolism: Secondary | ICD-10-CM | POA: Insufficient documentation

## 2014-01-02 DIAGNOSIS — C61 Malignant neoplasm of prostate: Secondary | ICD-10-CM

## 2014-01-02 DIAGNOSIS — E7800 Pure hypercholesterolemia, unspecified: Secondary | ICD-10-CM | POA: Insufficient documentation

## 2014-01-02 DIAGNOSIS — E119 Type 2 diabetes mellitus without complications: Secondary | ICD-10-CM | POA: Insufficient documentation

## 2014-01-02 DIAGNOSIS — Z87891 Personal history of nicotine dependence: Secondary | ICD-10-CM | POA: Insufficient documentation

## 2014-01-02 DIAGNOSIS — Z9089 Acquired absence of other organs: Secondary | ICD-10-CM | POA: Insufficient documentation

## 2014-01-02 DIAGNOSIS — K219 Gastro-esophageal reflux disease without esophagitis: Secondary | ICD-10-CM | POA: Insufficient documentation

## 2014-01-02 HISTORY — DX: Malignant neoplasm of prostate: C61

## 2014-01-02 HISTORY — DX: Gastro-esophageal reflux disease without esophagitis: K21.9

## 2014-01-02 HISTORY — DX: Pure hypercholesterolemia, unspecified: E78.00

## 2014-01-02 NOTE — Addendum Note (Signed)
Encounter addended by: Arlyss Repress, RN on: 01/02/2014  4:48 PM<BR>     Documentation filed: Charges VN

## 2014-01-02 NOTE — Progress Notes (Signed)
Please see the Nurse Progress Note in the MD Initial Consult Encounter for this patient. 

## 2014-01-02 NOTE — Progress Notes (Signed)
Radiation Oncology         [phone]) [phone] ________________________________  Initial outpatient Consultation  Name: Tommy Yang MRN: [id number]  Date: 01/02/2014  DOB: [birth year]  CC:No PCP Per Patient  Ardis Hughs, MD   REFERRING PHYSICIAN: Ardis Hughs, MD  DIAGNOSIS: 63 y.o. 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 gentleman.  He was noted to have an elevated PSA of 5.55 by his primary care physician, Dr. Everlene Farrier.  Accordingly, he was referred for evaluation in urology by Dr. Louis Meckel on 11/10/13,  digital rectal examination was performed at that time revealing no nodules.  The patient proceeded to transrectal ultrasound with 12 biopsies of the prostate on 12/06/13.  The prostate volume measured 34.72 cc.  Hypoechoic areas were seen on the left base.  Out of 12 core biopsies, 6 were positive.  The maximum Gleason score was 4+3, and this was seen in the distribution displayed below:    The patient reviewed the biopsy results with his urologist and he has kindly been referred today for discussion of potential radiation treatment options.  PREVIOUS RADIATION THERAPY: No  PAST MEDICAL HISTORY:  has a past medical history of Hypertension; Diabetes mellitus without complication; DVT (deep venous thrombosis); Prostate cancer (12/06/13); GERD (gastroesophageal reflux disease); and Hypercholesterolemia.    PAST SURGICAL HISTORY: Past Surgical History  Procedure Laterality Date  . Appendectomy    . Spine surgery   1964 and 1967    Tourette'ssyndrome  . Prostate biopsy  12/06/13    Gleason 4+3=7, volume 35 gm  . Knee surgery      right knee orthoscopic    FAMILY HISTORY: family history includes Diabetes in his father and mother; Heart disease in his father and mother; Hyperlipidemia in his mother; Varicose Veins in his mother.  SOCIAL HISTORY:  reports that he  quit smoking about 17 months ago. His smoking use included Cigarettes. He has a 11.5 pack-year smoking history. He has never used smokeless tobacco. He reports that he does not drink alcohol or use illicit drugs.  ALLERGIES: Review of patient's allergies indicates no known allergies.  MEDICATIONS:  Current Outpatient Prescriptions  Medication Sig Dispense Refill  . atorvastatin (LIPITOR) 40 MG tablet Take 1 tablet (40 mg total) by mouth daily.  30 tablet  11  . losartan (COZAAR) 50 MG tablet Take 1 tablet (50 mg total) by mouth daily.  90 tablet  3  . metFORMIN (GLUCOPHAGE) 1000 MG tablet Take 1 tablet (1,000 mg total) by mouth 2 (two) times daily with a meal.  60 tablet  11  . Rivaroxaban (XARELTO) 20 MG TABS tablet Take 1 tablet (20 mg total) by mouth daily with supper.  30 tablet  4  . sitaGLIPtin (JANUVIA) 100 MG tablet Take 1 tablet (100 mg total) by mouth daily.  30 tablet  11  . sildenafil (VIAGRA) 100 MG tablet Take 0.5-1 tablets (50-100 mg total) by mouth daily as needed for erectile dysfunction.  5 tablet  11   No current facility-administered medications for this encounter.    REVIEW OF SYSTEMS:  A 15 point review of systems is documented in the electronic medical record. This was obtained by the nursing staff. However, I reviewed this with the patient to discuss relevant findings and make appropriate changes.  A comprehensive review of systems was negative..  The patient completed an IPSS and IIEF  questionnaire.  His IPSS score was 1 indicating mild urinary outflow obstructive symptoms.  He indicated that his erectile function is able to complete sexual activity on most attempts.   PHYSICAL EXAM: This patient is in no acute distress.  He is alert and oriented.   height is 5\' 11"  (1.803 m) and weight is 288 lb 1.6 oz (130.681 kg). His temperature is 98.2 F (36.8 C). His blood pressure is 144/71 and his pulse is 67.  He exhibits no respiratory distress or labored breathing.  He appears  neurologically intact.  His mood is pleasant.  His affect is appropriate.  Please note the digital rectal exam findings described above.  KPS = 100  100 - Normal; no complaints; no evidence of disease. 90   - Able to carry on normal activity; minor signs or symptoms of disease. 80   - Normal activity with effort; some signs or symptoms of disease. 37   - Cares for self; unable to carry on normal activity or to do active work. 60   - Requires occasional assistance, but is able to care for most of his personal needs. 50   - Requires considerable assistance and frequent medical care. 41   - Disabled; requires special care and assistance. 27   - Severely disabled; hospital admission is indicated although death not imminent. 57   - Very sick; hospital admission necessary; active supportive treatment necessary. 10   - Moribund; fatal processes progressing rapidly. 0     - Dead  Karnofsky DA, Abelmann [place], Craver LS and Burchenal [facility] [phone]) The use of the nitrogen mustards in the palliative treatment of carcinoma: with particular reference to bronchogenic carcinoma Cancer 1 634-56   LABORATORY DATA:  Lab Results  Component Value Date   WBC 9.0 10/19/2013   HGB 14.9 10/19/2013   HCT 41.5 10/19/2013   MCV 80.4 10/19/2013   PLT 247 10/19/2013   Lab Results  Component Value Date   NA 133* 10/19/2013   K 4.9 10/19/2013   CL 96 10/19/2013   CO2 27 10/19/2013   Lab Results  Component Value Date   ALT 14 05/06/2013   AST 11 05/06/2013   ALKPHOS 71 05/06/2013   BILITOT 0.7 05/06/2013     RADIOGRAPHY: No results found.    IMPRESSION: This gentleman is a 63 y.o. 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  His T-Stage, Gleason's Score, and PSA put him into the intermediate risk group.  Accordingly he is eligible for a variety of potential treatment options including radical prostatectomy or external radiation with or without seed boost.  Radiation  therapy could also be combined with androgen deprivation.[name]  PLAN:  Today I reviewed the findings and workup thus far.  We discussed the natural history of prostate cancer.  We reviewed the the implications of T-stage, Gleason's Score, and PSA on decision-making and outcomes related to prostate cancer.  We discussed radiation treatment in the management of prostate cancer with regard to the logistics and delivery of external beam radiation treatment as well as the logistics and delivery of prostate brachytherapy.  We compared and contrasted each of these approaches and also compared these against prostatectomy.    The patient focused most of his questions and interest in robotic-assisted laparoscopic radical prostatectomy.  We discussed some of the potential advantages of surgery including surgical staging, the availability of salvage radiotherapy to the prostatic fossa, and the confidence associated  with immediate biochemical response.  We discussed some of the potential proven indications for postoperative radiotherapy including positive margins, extracapsular extension, and seminal vesicle involvement. We also talked about some of the other potential findings leading to a recommendation for radiotherapy including a non-zero postoperative PSA and positive lymph nodes.   I filled out a patient counseling form for him outlining his disease characteristics with relevant treatment diagrams and a thorough delineation of radiation including the logistics. The counseling form highlighted our discussion on the potential side effects associated with radiation therapy. The patient was given the form and we retained a copy for our records.   The patient would like to proceed with prostatectomy. I enjoyed meeting with him today, and will look forward to participating in the care of this very nice gentleman.  I will look forward to following his progress   I spent 30 minutes face to face with the patient and more  than 50% of that time was spent in counseling and/or coordination of care.   ------------------------------------------------  Sheral Apley. Tammi Klippel, M.D.

## 2014-01-27 ENCOUNTER — Telehealth: Payer: Self-pay

## 2014-01-27 NOTE — Telephone Encounter (Signed)
Pam with [facility] at [phone] ext [phone] is calling on behalf of dr Louis Meckel for patient to have surgical clearance from dr Everlene Farrier to go ahead with prostate cancer surgery

## 2014-01-28 ENCOUNTER — Ambulatory Visit (INDEPENDENT_AMBULATORY_CARE_PROVIDER_SITE_OTHER): Payer: BC Managed Care – PPO | Admitting: Emergency Medicine

## 2014-01-28 VITALS — BP 130/80 | HR 63 | Temp 98.2°F | Resp 17 | Ht 71.0 in | Wt 288.0 lb

## 2014-01-28 DIAGNOSIS — C61 Malignant neoplasm of prostate: Secondary | ICD-10-CM

## 2014-01-28 DIAGNOSIS — I82409 Acute embolism and thrombosis of unspecified deep veins of unspecified lower extremity: Secondary | ICD-10-CM

## 2014-01-28 DIAGNOSIS — Z01818 Encounter for other preprocedural examination: Secondary | ICD-10-CM

## 2014-01-28 DIAGNOSIS — E119 Type 2 diabetes mellitus without complications: Secondary | ICD-10-CM

## 2014-01-28 LAB — GLUCOSE, POCT (MANUAL RESULT ENTRY): POC Glucose: 192 mg/dl — AB (ref 70–99)

## 2014-01-28 LAB — POCT GLYCOSYLATED HEMOGLOBIN (HGB A1C): Hemoglobin A1C: 10.9

## 2014-01-28 MED ORDER — GLIPIZIDE ER 5 MG PO TB24: 5.0000 mg | ORAL_TABLET | Freq: Every day | ORAL | Status: DC

## 2014-01-28 NOTE — Progress Notes (Addendum)
Subjective:    Patient ID: Tommy Yang, male    DOB: [birth year], 63 y.o.   MRN: [id number]  HPI Chief Complaint  Patient presents with  . Follow-up    blood clot    This chart was scribed for [name], MD by Thea Alken, ED Scribe. This patient was seen in room 10 and the patient's care was started at 7:47 AM.  HPI Comments: Tommy Yang is a 63 y.o. male with h/o left LE DVT and DM who presents to the [facility] here for a f/u regarding a blood clot. Pt was last seen 1 month ago. Pt reports intermittent sharp pain in bilateral lower legs. Pt states this pain is similar to when he first had the blood clot. He reports the swelling in his legs has improved. He also states he has tingling in his toes that he believes is from DM. Pt is concerned due to not being able to get his blood sugar down past 150. Pt has been taking [place] and metformin. Pt is considering surgery for his prostate.    Past Medical History  Diagnosis Date  . Hypertension   . Diabetes mellitus without complication   . DVT (deep venous thrombosis)   . Prostate cancer 12/06/13    gleason 4+3=7, 6/12 cores positive  . GERD (gastroesophageal reflux disease)   . Hypercholesterolemia    No Known Allergies Prior to Admission medications   Medication Sig Start Date End Date Taking? Authorizing Provider  atorvastatin (LIPITOR) 40 MG tablet Take 1 tablet (40 mg total) by mouth daily. 10/26/13  Yes Darlyne Russian, MD  losartan (COZAAR) 50 MG tablet Take 1 tablet (50 mg total) by mouth daily. 10/27/13  Yes Darlyne Russian, MD  metFORMIN (GLUCOPHAGE) 1000 MG tablet Take 1 tablet (1,000 mg total) by mouth 2 (two) times daily with a meal. 10/26/13  Yes Darlyne Russian, MD  Rivaroxaban (XARELTO) 20 MG TABS tablet Take 1 tablet (20 mg total) by mouth daily with supper. 09/28/13  Yes Posey Boyer, MD  sitaGLIPtin (JANUVIA) 100 MG tablet Take 1 tablet (100 mg total) by mouth daily. 11/30/13  Yes Darlyne Russian,  MD  sildenafil (VIAGRA) 100 MG tablet Take 0.5-1 tablets (50-100 mg total) by mouth daily as needed for erectile dysfunction. 10/26/13   Darlyne Russian, MD   Review of Systems  Musculoskeletal: Positive for myalgias.      Objective:   Physical Exam CONSTITUTIONAL: Well developed/well nourished HEAD: Normocephalic/atraumatic EYES: EOMI/PERRL ENMT: Mucous membranes moist NECK: supple no meningeal signs SPINE:entire spine nontender CV: S1/S2 noted, no murmurs/rubs/gallops noted LUNGS: Lungs are clear to auscultation bilaterally, no apparent distress ABDOMEN: soft, nontender, no rebound or guarding GU:no cva tenderness NEURO: Pt is awake/alert, moves all extremitiesx4 EXTREMITIES: pulses normal, full ROM, bilateral varicose veins, good pulses to left foot, previous calf swelling has resolved. SKIN: warm, color normal PSYCH: no abnormalities of mood noted Results for orders placed in visit on 01/28/14  POCT GLYCOSYLATED HEMOGLOBIN (HGB A1C)      Result Value Ref Range   Hemoglobin A1C 10.9    GLUCOSE, POCT (MANUAL RESULT ENTRY)      Result Value Ref Range   POC Glucose 192 (*) 70 - 99 mg/dl      Assessment & Plan:  [facility]  and plan is for surgery. We'll proceed with cardiology evaluation. We'll proceed with an ultrasound of the left leg to followup  on his clot. After this is done we'll sit down with the patient and decide on his next. He is comfortable with his surgeon but had problems dealing with the scheduling Department at the urology office. I personally performed the services described in this documentation, which was scribed in my presence. The recorded information has been reviewed and is accurate. Hemoglobin A1c is 10.9 with a sugar of 192 will add Glucotrol XL 5 one a day. Recheck after studies are completed

## 2014-01-28 NOTE — Telephone Encounter (Signed)
Pt came into office today and had surgical clearance

## 2014-01-29 ENCOUNTER — Ambulatory Visit (HOSPITAL_COMMUNITY): Payer: BC Managed Care – PPO | Attending: Emergency Medicine

## 2014-02-01 ENCOUNTER — Other Ambulatory Visit: Payer: Self-pay | Admitting: Urology

## 2014-02-03 ENCOUNTER — Telehealth: Payer: Self-pay | Admitting: *Deleted

## 2014-02-03 NOTE — Telephone Encounter (Signed)
Pt needs appt with Cardiology ASAP. Pt is scheduled for prostate sx on June 30 and we required cardio eval for clearance to have this sx.    [facility] needs appt information please call Pam- [phone] when scheduled.

## 2014-02-08 NOTE — Telephone Encounter (Signed)
Pt notified. Pam notified. Referrals can we also check on the status of his venous doppler too please. Thanks so much

## 2014-02-08 NOTE — Telephone Encounter (Signed)
Spoke with [name]. She will call and check on this. [facility] called again. [name], please let me know what you find out. Thanks

## 2014-02-08 NOTE — Telephone Encounter (Signed)
Patient has appt 06/22 2:30pm. With Dr. Claiborne Billings. [place]. The earliest they can get him in. Please call patient thank you!

## 2014-02-09 ENCOUNTER — Other Ambulatory Visit (HOSPITAL_COMMUNITY): Payer: Self-pay | Admitting: Cardiology

## 2014-02-09 DIAGNOSIS — O2230 Deep phlebothrombosis in pregnancy, unspecified trimester: Secondary | ICD-10-CM

## 2014-02-09 DIAGNOSIS — I82409 Acute embolism and thrombosis of unspecified deep veins of unspecified lower extremity: Secondary | ICD-10-CM

## 2014-02-09 NOTE — Telephone Encounter (Signed)
Patient has appointment for his doppler on 06/15 2:30pm.  Patient notified and understood. In case he calls back because he was driving. The information is below.    [facility]. [address] [place], [place] [address] Main: [phone])

## 2014-02-13 ENCOUNTER — Ambulatory Visit (HOSPITAL_COMMUNITY): Payer: BC Managed Care – PPO | Attending: Emergency Medicine | Admitting: Cardiology

## 2014-02-13 DIAGNOSIS — I825Y9 Chronic embolism and thrombosis of unspecified deep veins of unspecified proximal lower extremity: Secondary | ICD-10-CM | POA: Insufficient documentation

## 2014-02-13 DIAGNOSIS — E669 Obesity, unspecified: Secondary | ICD-10-CM | POA: Insufficient documentation

## 2014-02-13 DIAGNOSIS — E119 Type 2 diabetes mellitus without complications: Secondary | ICD-10-CM | POA: Insufficient documentation

## 2014-02-13 DIAGNOSIS — I82409 Acute embolism and thrombosis of unspecified deep veins of unspecified lower extremity: Secondary | ICD-10-CM

## 2014-02-13 DIAGNOSIS — I87009 Postthrombotic syndrome without complications of unspecified extremity: Secondary | ICD-10-CM

## 2014-02-13 NOTE — Progress Notes (Signed)
Lower venous duplex unilateral completed.

## 2014-02-15 ENCOUNTER — Telehealth: Payer: Self-pay | Admitting: *Deleted

## 2014-02-15 NOTE — Telephone Encounter (Signed)
Faxed copy of doppler to urologist.  Advised pt per Dr. Everlene Farrier that there is still clot present not cleared to be off Xarelto and to return to discuss.  Pt will return next wed to discuss.  He wants to see cardiologist first.

## 2014-02-16 ENCOUNTER — Telehealth: Payer: Self-pay

## 2014-02-16 NOTE — Telephone Encounter (Signed)
Patient had a missed call.  States he knows the results and will discuss with Dr. Everlene Farrier on Wednesday when he comes in for follow up.   [phone]

## 2014-02-20 ENCOUNTER — Ambulatory Visit (INDEPENDENT_AMBULATORY_CARE_PROVIDER_SITE_OTHER): Payer: BC Managed Care – PPO | Admitting: Cardiovascular Disease

## 2014-02-20 ENCOUNTER — Encounter: Payer: Self-pay | Admitting: Cardiovascular Disease

## 2014-02-20 VITALS — BP 126/82 | HR 75 | Ht 71.0 in | Wt 293.4 lb

## 2014-02-20 DIAGNOSIS — Z8249 Family history of ischemic heart disease and other diseases of the circulatory system: Secondary | ICD-10-CM

## 2014-02-20 DIAGNOSIS — E785 Hyperlipidemia, unspecified: Secondary | ICD-10-CM

## 2014-02-20 DIAGNOSIS — E782 Mixed hyperlipidemia: Secondary | ICD-10-CM

## 2014-02-20 DIAGNOSIS — R0989 Other specified symptoms and signs involving the circulatory and respiratory systems: Secondary | ICD-10-CM

## 2014-02-20 DIAGNOSIS — E119 Type 2 diabetes mellitus without complications: Secondary | ICD-10-CM

## 2014-02-20 DIAGNOSIS — Z79899 Other long term (current) drug therapy: Secondary | ICD-10-CM

## 2014-02-20 DIAGNOSIS — G4733 Obstructive sleep apnea (adult) (pediatric): Secondary | ICD-10-CM

## 2014-02-20 DIAGNOSIS — I82402 Acute embolism and thrombosis of unspecified deep veins of left lower extremity: Secondary | ICD-10-CM

## 2014-02-20 DIAGNOSIS — E669 Obesity, unspecified: Secondary | ICD-10-CM

## 2014-02-20 DIAGNOSIS — R5383 Other fatigue: Secondary | ICD-10-CM

## 2014-02-20 DIAGNOSIS — R5381 Other malaise: Secondary | ICD-10-CM

## 2014-02-20 DIAGNOSIS — I10 Essential (primary) hypertension: Secondary | ICD-10-CM

## 2014-02-20 DIAGNOSIS — G4730 Sleep apnea, unspecified: Secondary | ICD-10-CM

## 2014-02-20 DIAGNOSIS — R0609 Other forms of dyspnea: Secondary | ICD-10-CM

## 2014-02-20 DIAGNOSIS — I82409 Acute embolism and thrombosis of unspecified deep veins of unspecified lower extremity: Secondary | ICD-10-CM

## 2014-02-20 NOTE — Patient Instructions (Signed)
Your physician has recommended that you have a sleep study. This test records several body functions during sleep, including: brain activity, eye movement, oxygen and carbon dioxide blood levels, heart rate and rhythm, breathing rate and rhythm, the flow of air through your mouth and nose, snoring, body muscle movements, and chest and belly movement.  Your physician has requested that you have an exercise tolerance test. For further information please visit [email or web address]. Please also follow instruction sheet, as given.  Your physician has requested that you have an echocardiogram. Echocardiography is a painless test that uses sound waves to create images of your heart. It provides your doctor with information about the size and shape of your heart and how well your heart's chambers and valves are working. This procedure takes approximately one hour. There are no restrictions for this procedure.  Your physician recommends that you have some blood work to be done FASTING. (CMET, TSH, Sed Rate, Lipid panel, and CBC)  Your physician recommends that you schedule a follow-up appointment after you have completed your tests.

## 2014-02-27 ENCOUNTER — Inpatient Hospital Stay (HOSPITAL_COMMUNITY): Admission: RE | Admit: 2014-02-27 | Payer: BC Managed Care – PPO | Source: Ambulatory Visit | Admitting: Urology

## 2014-02-27 ENCOUNTER — Ambulatory Visit (INDEPENDENT_AMBULATORY_CARE_PROVIDER_SITE_OTHER): Payer: BC Managed Care – PPO | Admitting: Emergency Medicine

## 2014-02-27 ENCOUNTER — Encounter (HOSPITAL_COMMUNITY): Admission: RE | Payer: Self-pay | Source: Ambulatory Visit

## 2014-02-27 ENCOUNTER — Encounter: Payer: Self-pay | Admitting: Emergency Medicine

## 2014-02-27 VITALS — BP 162/72 | HR 64 | Temp 98.0°F | Resp 18 | Ht 70.0 in | Wt 290.8 lb

## 2014-02-27 DIAGNOSIS — E119 Type 2 diabetes mellitus without complications: Secondary | ICD-10-CM

## 2014-02-27 DIAGNOSIS — I82402 Acute embolism and thrombosis of unspecified deep veins of left lower extremity: Secondary | ICD-10-CM

## 2014-02-27 DIAGNOSIS — C61 Malignant neoplasm of prostate: Secondary | ICD-10-CM

## 2014-02-27 DIAGNOSIS — I82409 Acute embolism and thrombosis of unspecified deep veins of unspecified lower extremity: Secondary | ICD-10-CM

## 2014-02-27 LAB — GLUCOSE, POCT (MANUAL RESULT ENTRY): POC Glucose: 222 mg/dl — AB (ref 70–99)

## 2014-02-27 SURGERY — ROBOTIC ASSISTED LAPAROSCOPIC RADICAL PROSTATECTOMY
Anesthesia: General

## 2014-02-27 NOTE — Patient Instructions (Signed)
Return around the first week in September to have your A1c checked.

## 2014-02-27 NOTE — Progress Notes (Signed)
   Subjective:    Patient ID: Tommy Yang, male    DOB: [birth year], 63 y.o.   MRN: [id number]  HPI 63 year old male pt here for a follow up for known DVT. States diabetes medication is working good. He says his BP has been doing better as well. He had another venous dopplar study done. It shows that he still has a clot in his left leg. He sees the cardiologist this Thursday. He is going to have a stress test and echo done. He is going to have a sleep study done.    Review of Systems     Objective:   Physical Exam in no distress. Neck is supple. Chest clear heart was regular rate without murmurs abdomen obese without masses. There are varicose changes of his left leg. There is mild swelling of the calf but much less than previous  Results for orders placed in visit on 02/27/14  GLUCOSE, POCT (MANUAL RESULT ENTRY)      Result Value Ref Range   POC Glucose 222 (*) 70 - 99 mg/dl        Assessment & Plan:  We'll hold off on surgery at the present time. It is too early to do an A1c. We'll check this again in 2 months. I agreed with him I thought it was reasonable to hold off on his surgery. We'll do his A1c in 2 months and most likely repeat his venous Doppler at that time. He will get the opinion of the cardiologist regarding his prostate surgery. He is currently not interested in a vena cava filter .

## 2014-02-28 ENCOUNTER — Encounter: Payer: Self-pay | Admitting: Cardiovascular Disease

## 2014-02-28 ENCOUNTER — Telehealth (HOSPITAL_COMMUNITY): Payer: Self-pay

## 2014-02-28 DIAGNOSIS — G4733 Obstructive sleep apnea (adult) (pediatric): Secondary | ICD-10-CM | POA: Insufficient documentation

## 2014-02-28 DIAGNOSIS — E785 Hyperlipidemia, unspecified: Secondary | ICD-10-CM | POA: Insufficient documentation

## 2014-02-28 NOTE — Progress Notes (Signed)
Patient ID: Tommy Yang, male   DOB: 27-Aug-1951, 63 y.o.   MRN: [id number]     PATIENT PROFILE: Tommy Yang is a 63 y.o. male who is referred for cardiology evaluation prior to undergoing prostate surgery.   HPI:  Tommy Yang is a 63 y.o. 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  He has undergone evaluation by Dr. Tyler Pita.  The patient has a history of lower extremity DVT, which was initially diagnosed in January with acute deep vein thrombosis involving the posterior tibial vein.  3 inches above the ankle, popliteal vein, gastrocnemius, multiple calf veins, proximal profunda, femoral, and common femoral veins.  He also had thrombus in the saphenofemoral junction.  The Doppler in February 2015 showed little change except for resolution of DVT in the profunda femoral vein.  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  He has been on anticoagulation with Xarelto 20 mg.  Also, history of hypertension, and has been on losartan, 50 mg.  He has diabetes mellitus and is on metformin, as well as Januvia.  Also, is a history of hyperlipidemia on atorvastatin 40 mg.  He has a history of Tourette's syndrome.  He denies any significant chest pain.  Upon questioning, his sleep, he oftentimes wakes up in his gasping for breath.  He does snore.  He wakes up approximately 2-3 times per night.  Past Medical History  Diagnosis Date  . Hypertension   . Diabetes mellitus without complication   . DVT (deep venous thrombosis)   . Prostate cancer 12/06/13    gleason 4+3=7, 6/12 cores positive  . GERD (gastroesophageal reflux disease)   . Hypercholesterolemia     Past Surgical History  Procedure Laterality Date  . Appendectomy    . Spine surgery   1964 and 1967   Tourette'ssyndrome  . Prostate biopsy  12/06/13    Gleason 4+3=7, volume 35 gm  . Knee surgery      right knee orthoscopic    No Known Allergies  Current Outpatient Prescriptions  Medication Sig Dispense Refill  . atorvastatin (LIPITOR) 40 MG tablet Take 1 tablet (40 mg total) by mouth daily.  30 tablet  11  . glipiZIDE (GLUCOTROL XL) 5 MG 24 hr tablet Take 1 tablet (5 mg total) by mouth daily with breakfast.  30 tablet  11  . losartan (COZAAR) 50 MG tablet Take 1 tablet (50 mg total) by mouth daily.  90 tablet  3  . metFORMIN (GLUCOPHAGE) 1000 MG tablet Take 1 tablet (1,000 mg total) by mouth 2 (two) times daily with a meal.  60 tablet  11  . Rivaroxaban (XARELTO) 20 MG TABS tablet Take 1 tablet (20 mg total) by mouth daily with supper.  30 tablet  4  . sildenafil (VIAGRA) 100 MG tablet Take 0.5-1 tablets (50-100 mg total) by mouth daily as needed for erectile dysfunction.  5 tablet  11  . sitaGLIPtin (JANUVIA) 100 MG tablet Take 1 tablet (100 mg total) by mouth daily.  30 tablet  11   No current facility-administered medications for this visit.    Social he is married for 30 years.  He completed college at [employer].  He is a Dance movement psychotherapist.  He smoked for 20 years and quit in 2013.  He does  drink  occasional alcohol.  Family History  Problem Relation Age of Onset  . Heart disease Mother     before age 87  . Diabetes Mother   . Hyperlipidemia Mother   . Varicose Veins Mother   . Heart disease Father   . Diabetes Father     ROS General: Negative; No fevers, chills, or night sweats HEENT: Negative; No changes in vision or hearing, sinus congestion, difficulty swallowing Pulmonary: Negative; No cough, wheezing, shortness of breath, hemoptysis Cardiovascular:  See HPI; No chest pain, presyncope, syncope, palpitations, Positive for chronic DVT GI: Negative; No nausea, vomiting, diarrhea, or abdominal pain GU: Positive for prostate CA; No dysuria, hematuria, or difficulty  voiding Musculoskeletal: Negative; no myalgias, joint pain, or weakness Hematologic/Oncologic: Negative; no easy bruising, bleeding Endocrine: Negative; no heat/cold intolerance; no diabetes Neuro: Positive for Tourette's syndrome no changes in balance, headaches Skin: Negative; No rashes or skin lesions Psychiatric: Negative; No behavioral problems, depression Sleep: positive for snoring No daytime sleepiness, hypersomnolence, bruxism, restless legs, hypnogagnic hallucinations Other comprehensive 14 point system review is negative   Physical Exam BP 126/82  Pulse 75  Ht 5\' 11"  (1.803 m)  Wt 293 lb 6.4 oz (133.085 kg)  BMI 40.94 kg/m2 General: Alert, oriented, no distress.  Positive for 10 pound weight gain in the last year Skin: normal turgor, no rashes, warm and dry HEENT: Normocephalic, atraumatic. Pupils equal round and reactive to light; sclera anicteric; extraocular muscles intact; Fundi ** Nose without nasal septal hypertrophy Mouth/Parynx benign; Mallinpatti scale Neck: No JVD, no carotid bruits; normal carotid upstroke Lungs: clear to ausculatation and percussion; no wheezing or rales Chest wall: without tenderness to palpitation Heart: PMI not displaced, RRR, s1 s2 normal, 1/6 systolic murmur, no diastolic murmur, no rubs, gallops, thrills, or heaves Abdomen: soft, nontender; no hepatosplenomehaly, BS+; abdominal aorta nontender and not dilated by palpation. Back: no CVA tenderness Pulses 2+ Musculoskeletal: full range of motion, normal strength, no joint deformities Extremities: no clubbing cyanosis or edema, Homan's sign negative  Neurologic: grossly nonfocal; Cranial nerves grossly wnl Psychologic: Normal mood and affect   ECG (independently read by me): Normal sinus rhythm at 75 beats per minute.  No ectopy.  LABS:  BMET    Component Value Date/Time   NA 133* 10/19/2013 0917   K 4.9 10/19/2013 0917   CL 96 10/19/2013 0917   CO2 27 10/19/2013 0917   GLUCOSE 493*  10/19/2013 0917   BUN 13 10/19/2013 0917   CREATININE 0.89 10/19/2013 0917   CREATININE 0.91 05/06/2013 0935   CALCIUM 9.5 10/19/2013 0917   GFRNONAA >90 10/19/2013 0917   GFRAA >90 10/19/2013 0917     Hepatic Function Panel     Component Value Date/Time   PROT 7.2 05/06/2013 0935   ALBUMIN 4.3 05/06/2013 0935   AST 11 05/06/2013 0935   ALT 14 05/06/2013 0935   ALKPHOS 71 05/06/2013 0935   BILITOT 0.7 05/06/2013 0935     CBC    Component Value Date/Time   WBC 9.0 10/19/2013 0917   WBC 11.3* 05/06/2013 0943   RBC 5.16 10/19/2013 0917   RBC 5.70 05/06/2013 0943   HGB 14.9 10/19/2013 0917   HGB 17.3 05/06/2013 0943   HCT 41.5 10/19/2013 0917   HCT 50.1 05/06/2013 0943   PLT 247 10/19/2013 0917   MCV 80.4 10/19/2013 0917   MCV 87.9 05/06/2013 0943   MCH 28.9 10/19/2013 0917   MCH 30.4 05/06/2013 0943   MCHC 35.9 10/19/2013 0917  MCHC 34.5 05/06/2013 0943   RDW 13.7 10/19/2013 0917     BNP No results found for this basename: probnp    Lipid Panel     Component Value Date/Time   CHOL 249* 05/06/2013 0935   TRIG 362* 05/06/2013 0935   HDL 30* 05/06/2013 0935   CHOLHDL 8.3 05/06/2013 0935   VLDL 72* 05/06/2013 0935   LDLCALC 147* 05/06/2013 0935      RADIOLOGY: No results found.   ASSESSMENT AND PLAN: Mr. Tommy Yang is a 63 year old gentleman who has recently diagnosed prostate CA.  He also has been documented to have left lower extremity DVT and has been on anticoagulation since January.  He has chronic residual thrombus noted in his left common femoral vein, femoral vein, popliteal vein, and the posterior tibial vein.  His blood pressure today is well controlled.  His clinical history is also suggestive of obstructive sleep apnea in that he often awakens gasping for breath and snores loudly.  Prior to his planned prostate surgery, I am recommending he undergo evaluation for obstructive sleep apnea.  I scheduled him for a 2-D echo Doppler study.  I am scheduling him to undergo a routine treadmill test.   Laboratory will be obtained consisting of a CMET, TSH, sedimentation rate, lipid panel and CBC.  I'll see him back in the office in followup and further recommendation will be made at that time.   Tommy Sine, MD, [facility] 02/28/2014 9:02 PM

## 2014-03-01 LAB — CBC
HCT: 42.6 % (ref 39.0–52.0)
Hemoglobin: 14.6 g/dL (ref 13.0–17.0)
MCH: 27.8 pg (ref 26.0–34.0)
MCHC: 34.3 g/dL (ref 30.0–36.0)
MCV: 81 fL (ref 78.0–100.0)
Platelets: 263 10*3/uL (ref 150–400)
RBC: 5.26 MIL/uL (ref 4.22–5.81)
RDW: 14.9 % (ref 11.5–15.5)
WBC: 10.6 10*3/uL — ABNORMAL HIGH (ref 4.0–10.5)

## 2014-03-01 LAB — SEDIMENTATION RATE: Sed Rate: 9 mm/hr (ref 0–16)

## 2014-03-02 ENCOUNTER — Encounter (HOSPITAL_COMMUNITY): Payer: BC Managed Care – PPO

## 2014-03-02 ENCOUNTER — Ambulatory Visit (HOSPITAL_COMMUNITY): Admission: RE | Admit: 2014-03-02 | Payer: BC Managed Care – PPO | Source: Ambulatory Visit

## 2014-03-02 LAB — COMPREHENSIVE METABOLIC PANEL
ALT: 15 U/L (ref 0–53)
AST: 12 U/L (ref 0–37)
Albumin: 3.9 g/dL (ref 3.5–5.2)
Alkaline Phosphatase: 76 U/L (ref 39–117)
BUN: 14 mg/dL (ref 6–23)
CO2: 26 mEq/L (ref 19–32)
Calcium: 9.3 mg/dL (ref 8.4–10.5)
Chloride: 102 mEq/L (ref 96–112)
Creat: 0.85 mg/dL (ref 0.50–1.35)
Glucose, Bld: 129 mg/dL — ABNORMAL HIGH (ref 70–99)
Potassium: 5.3 mEq/L (ref 3.5–5.3)
Sodium: 140 mEq/L (ref 135–145)
Total Bilirubin: 0.6 mg/dL (ref 0.2–1.2)
Total Protein: 7 g/dL (ref 6.0–8.3)

## 2014-03-02 LAB — LIPID PANEL
Cholesterol: 156 mg/dL (ref 0–200)
HDL: 27 mg/dL — ABNORMAL LOW (ref >39)
LDL Cholesterol: 87 mg/dL (ref 0–99)
Total CHOL/HDL Ratio: 5.8 Ratio
Triglycerides: 209 mg/dL — ABNORMAL HIGH (ref <150)
VLDL: 42 mg/dL — ABNORMAL HIGH (ref 0–40)

## 2014-03-02 LAB — TSH: TSH: 1.16 u[IU]/mL (ref 0.350–4.500)

## 2014-03-07 ENCOUNTER — Other Ambulatory Visit: Payer: Self-pay | Admitting: Family Medicine

## 2014-03-08 NOTE — Telephone Encounter (Signed)
Patient called wanting a prescription refill on Xarelto 20mg   Please advise at [phone]

## 2014-03-09 NOTE — Telephone Encounter (Signed)
Encounter complete. 

## 2014-03-10 ENCOUNTER — Other Ambulatory Visit: Payer: Self-pay | Admitting: Emergency Medicine

## 2014-03-10 ENCOUNTER — Telehealth: Payer: Self-pay

## 2014-03-10 MED ORDER — RIVAROXABAN 20 MG PO TABS: ORAL_TABLET | ORAL | Status: DC

## 2014-03-10 NOTE — Telephone Encounter (Signed)
Dr. Everlene Farrier, we just RF'ed this pt's Xarelto for #30 with no RF's and there was a 4 day delay in him getting it so he was off of it. He wants to know if we can add RFs to this rx so that he doesn't run into this problem again. Thanks

## 2014-03-10 NOTE — Telephone Encounter (Signed)
I sent in 2 refills

## 2014-03-27 ENCOUNTER — Ambulatory Visit: Payer: BC Managed Care – PPO

## 2014-03-27 ENCOUNTER — Ambulatory Visit: Payer: BC Managed Care – PPO | Admitting: Radiation Oncology

## 2014-04-05 ENCOUNTER — Encounter: Payer: Self-pay | Admitting: *Deleted

## 2014-04-08 ENCOUNTER — Telehealth: Payer: Self-pay

## 2014-04-08 NOTE — Telephone Encounter (Signed)
Patient requesting a refill on "Xarelto" , send to [facility] [address]. Per  Patient he is out and he really needs this medication. Patient was very upset stated we were suppose to make this refillable a "lifetime" since it is his blood thinner medication. Patients call back number is [phone]

## 2014-04-08 NOTE — Telephone Encounter (Signed)
Pt said automated machine at [facility] told him he had to get approval from [name] for a RF of his Xarelto. Called pharm and they said it was ready for p/u with 2 RF's. Pt notified. He will be coming back in for a CPE with Dr. Everlene Farrier in one month. Will talk to Dr. Everlene Farrier at that time about getting longer RF's.

## 2014-04-18 ENCOUNTER — Telehealth: Payer: Self-pay | Admitting: *Deleted

## 2014-04-18 NOTE — Telephone Encounter (Signed)
Called patient to see if he was ever scheduled for the sleep study that was ordered. He informs me that due to him having a deductible he cannot afford to have this done at this time.

## 2014-05-03 ENCOUNTER — Ambulatory Visit (INDEPENDENT_AMBULATORY_CARE_PROVIDER_SITE_OTHER): Payer: BC Managed Care – PPO | Admitting: Emergency Medicine

## 2014-05-03 VITALS — BP 135/65 | HR 63 | Temp 97.7°F | Resp 18 | Ht 70.0 in | Wt 294.0 lb

## 2014-05-03 DIAGNOSIS — Z23 Encounter for immunization: Secondary | ICD-10-CM

## 2014-05-03 DIAGNOSIS — Z8546 Personal history of malignant neoplasm of prostate: Secondary | ICD-10-CM

## 2014-05-03 DIAGNOSIS — I82409 Acute embolism and thrombosis of unspecified deep veins of unspecified lower extremity: Secondary | ICD-10-CM

## 2014-05-03 DIAGNOSIS — I82402 Acute embolism and thrombosis of unspecified deep veins of left lower extremity: Secondary | ICD-10-CM

## 2014-05-03 DIAGNOSIS — Z0000 Encounter for general adult medical examination without abnormal findings: Secondary | ICD-10-CM

## 2014-05-03 LAB — CBC WITH DIFFERENTIAL/PLATELET
Basophils Absolute: 0 10*3/uL (ref 0.0–0.1)
Basophils Relative: 0 % (ref 0–1)
Eosinophils Absolute: 0.1 10*3/uL (ref 0.0–0.7)
Eosinophils Relative: 1 % (ref 0–5)
HCT: 42.9 % (ref 39.0–52.0)
Hemoglobin: 15.1 g/dL (ref 13.0–17.0)
Lymphocytes Relative: 34 % (ref 12–46)
Lymphs Abs: 4 10*3/uL (ref 0.7–4.0)
MCH: 28.2 pg (ref 26.0–34.0)
MCHC: 35.2 g/dL (ref 30.0–36.0)
MCV: 80.2 fL (ref 78.0–100.0)
Monocytes Absolute: 1.2 10*3/uL — ABNORMAL HIGH (ref 0.1–1.0)
Monocytes Relative: 10 % (ref 3–12)
Neutro Abs: 6.5 10*3/uL (ref 1.7–7.7)
Neutrophils Relative %: 55 % (ref 43–77)
Platelets: 245 10*3/uL (ref 150–400)
RBC: 5.35 MIL/uL (ref 4.22–5.81)
RDW: 15.5 % (ref 11.5–15.5)
WBC: 11.8 10*3/uL — ABNORMAL HIGH (ref 4.0–10.5)

## 2014-05-03 LAB — LIPID PANEL
Cholesterol: 154 mg/dL (ref 0–200)
HDL: 33 mg/dL — ABNORMAL LOW (ref >39)
LDL Cholesterol: 79 mg/dL (ref 0–99)
Total CHOL/HDL Ratio: 4.7 Ratio
Triglycerides: 209 mg/dL — ABNORMAL HIGH (ref <150)
VLDL: 42 mg/dL — ABNORMAL HIGH (ref 0–40)

## 2014-05-03 LAB — POCT GLYCOSYLATED HEMOGLOBIN (HGB A1C): Hemoglobin A1C: 6.8

## 2014-05-03 LAB — MICROALBUMIN, URINE: Microalb, Ur: 11.88 mg/dL — ABNORMAL HIGH (ref 0.00–1.89)

## 2014-05-03 LAB — GLUCOSE, POCT (MANUAL RESULT ENTRY): POC Glucose: 118 mg/dl — AB (ref 70–99)

## 2014-05-03 MED ORDER — ZOSTER VACCINE LIVE 19400 UNT/0.65ML ~~LOC~~ SOLR: 0.6500 mL | Freq: Once | SUBCUTANEOUS | Status: DC

## 2014-05-03 MED ORDER — RIVAROXABAN 20 MG PO TABS: ORAL_TABLET | ORAL | Status: DC

## 2014-05-03 NOTE — Progress Notes (Signed)
   Subjective:    Patient ID: Tommy Yang, male    DOB: 1951-08-17, 63 y.o.   MRN: [id number]  HPI  63 year old male presents to [facility] for complete physical including follow up on DVT and diabetes  Noticed that his left big toe is discolored   Patient states when he swallows his food his throat hurts, he is "constantly burping", and has indigestion  Needs flu vaccine Tetanus- 10/08/2011 Patient has never had a colonoscopy    Review of Systems  Constitutional: Negative.   HENT: Negative.   Eyes: Negative.   Respiratory: Negative.   Cardiovascular: Positive for leg swelling.  Gastrointestinal: Negative.   Endocrine: Negative.   Genitourinary:       History of prostate cancer. He is trying to decide whether he wants to have proton therapy or radiation treatment  Allergic/Immunologic: Negative.   Neurological: Negative.   Hematological: Bruises/bleeds easily.  Psychiatric/Behavioral: Negative.        Objective:   Physical Exam  Constitutional: He appears well-developed and well-nourished.  HENT:  Head: Normocephalic and atraumatic.  Eyes: Pupils are equal, round, and reactive to light.  Neck: No thyromegaly present.  Cardiovascular: Normal rate, regular rhythm, normal heart sounds and intact distal pulses.  Exam reveals no gallop and no friction rub.   No murmur heard. Pulmonary/Chest: Effort normal and breath sounds normal. No respiratory distress. He has no wheezes. He has no rales. He exhibits no tenderness.  Abdominal: Soft. He exhibits no distension. There is no tenderness.  Genitourinary:  He has a history of prostate cancer this exam was deferred   extremities there is 2+ swelling with bilateral varicosities there are stasis changes of both.   Results for orders placed in visit on 05/03/14  GLUCOSE, POCT (MANUAL RESULT ENTRY)      Result Value Ref Range   POC Glucose 118 (*) 70 - 99 mg/dl  POCT GLYCOSYLATED HEMOGLOBIN (HGB A1C)    Result Value Ref Range   Hemoglobin A1C 6.8         Assessment & Plan:  Referral made for repeat Doppler. He may want a second opinion as to whether he would do better on Coumadin. Flu shot was given today. He is considering proton therapy for his prostate cancer. Referral made for colonoscopy. Repeat lipid panel done to see if this is improved. Diabetes is doing great. We'll see patient back after his Doppler to discuss further management .

## 2014-05-09 ENCOUNTER — Telehealth: Payer: Self-pay | Admitting: *Deleted

## 2014-05-09 ENCOUNTER — Ambulatory Visit (HOSPITAL_COMMUNITY)
Admission: RE | Admit: 2014-05-09 | Discharge: 2014-05-09 | Disposition: A | Discharge status: Home / Self Care | Payer: BC Managed Care – PPO | Source: Ambulatory Visit | Attending: Emergency Medicine | Admitting: Emergency Medicine

## 2014-05-09 ENCOUNTER — Ambulatory Visit (INDEPENDENT_AMBULATORY_CARE_PROVIDER_SITE_OTHER): Payer: BC Managed Care – PPO | Admitting: Gastroenterology

## 2014-05-09 ENCOUNTER — Encounter: Payer: Self-pay | Admitting: Gastroenterology

## 2014-05-09 VITALS — BP 128/72 | HR 68 | Ht 70.5 in | Wt 295.6 lb

## 2014-05-09 DIAGNOSIS — R1319 Other dysphagia: Secondary | ICD-10-CM | POA: Insufficient documentation

## 2014-05-09 DIAGNOSIS — I82409 Acute embolism and thrombosis of unspecified deep veins of unspecified lower extremity: Secondary | ICD-10-CM | POA: Insufficient documentation

## 2014-05-09 DIAGNOSIS — Z0000 Encounter for general adult medical examination without abnormal findings: Secondary | ICD-10-CM

## 2014-05-09 DIAGNOSIS — K219 Gastro-esophageal reflux disease without esophagitis: Secondary | ICD-10-CM | POA: Insufficient documentation

## 2014-05-09 DIAGNOSIS — I82402 Acute embolism and thrombosis of unspecified deep veins of left lower extremity: Secondary | ICD-10-CM

## 2014-05-09 DIAGNOSIS — I803 Phlebitis and thrombophlebitis of lower extremities, unspecified: Secondary | ICD-10-CM | POA: Insufficient documentation

## 2014-05-09 DIAGNOSIS — Z1211 Encounter for screening for malignant neoplasm of colon: Secondary | ICD-10-CM | POA: Insufficient documentation

## 2014-05-09 MED ORDER — MOVIPREP 100 G PO SOLR: 1.0000 | Freq: Once | ORAL | Status: DC

## 2014-05-09 MED ORDER — OMEPRAZOLE 40 MG PO CPDR: 40.0000 mg | DELAYED_RELEASE_CAPSULE | Freq: Every day | ORAL | Status: DC

## 2014-05-09 NOTE — Progress Notes (Signed)
VASCULAR LAB PRELIMINARY  PRELIMINARY  PRELIMINARY  PRELIMINARY  Left lower extremity venous duplex completed.    Preliminary report:  DVT in left lower extremity continues to resolve.  No new thrombosis noted.  Markes Shatswell, RVT 05/09/2014, 2:18 PM

## 2014-05-09 NOTE — Progress Notes (Signed)
05/09/2014 Tommy Yang [id number] Jan 03, 1951   HISTORY OF PRESENT ILLNESS:  This is a 63 year old male who is here today to discuss screening colonoscopy at the request of his PCP, Dr. Everlene Farrier.  Has PMH of HLD, OSA not on CPAP, DM, and LLE DVT in January 2015 for which he is now on Xarelto.  Never had colonoscopy in the past.  Denies any bowel issues including rectal bleeding.  While he is here he also complains that over the past several months he has been experiencing much more frequent heartburn/reflux and intermittent dysphagia as well.  Says that he's had issues with heartburn/reflux with certain foods for a long time, but has never used any medication regularly; only used TUMs prn.  He complains of a lot of belching.  He says that sometimes when he eats/drinks it feels like stuff gets hung up and causes pressure in his chest; the food/beverage eventually passes and reliefs the discomfort.  Never chokes/vomits.    Past Medical History  Diagnosis Date  . Hypertension   . Diabetes mellitus without complication   . DVT (deep venous thrombosis)   . Prostate cancer 12/06/13    gleason 4+3=7, 6/12 cores positive  . GERD (gastroesophageal reflux disease)   . Hypercholesterolemia    Past Surgical History  Procedure Laterality Date  . Appendectomy    . Spine surgery   1964 and 1967    Tourette'ssyndrome  . Prostate biopsy  12/06/13    Gleason 4+3=7, volume 35 gm  . Knee surgery      right knee orthoscopic    reports that he quit smoking about 21 months ago. His smoking use included Cigarettes. He has a 11.5 pack-year smoking history. He has never used smokeless tobacco. He reports that he does not drink alcohol or use illicit drugs. family history includes Diabetes in his father and mother; Heart disease in his father and mother; Hyperlipidemia in his mother; Varicose Veins in his mother. No Known Allergies    Outpatient Encounter Prescriptions as of 05/09/2014  Medication Sig  .  atorvastatin (LIPITOR) 40 MG tablet Take 1 tablet (40 mg total) by mouth daily.  [name] glipiZIDE (GLUCOTROL XL) 5 MG 24 hr tablet Take 1 tablet (5 mg total) by mouth daily with breakfast.  . losartan (COZAAR) 50 MG tablet Take 1 tablet (50 mg total) by mouth daily.  . metFORMIN (GLUCOPHAGE) 1000 MG tablet Take 1 tablet (1,000 mg total) by mouth 2 (two) times daily with a meal.  . rivaroxaban (XARELTO) 20 MG TABS tablet TAKE ONE TABLET BY MOUTH ONCE DAILY WITH  SUPPER.  . sildenafil (VIAGRA) 100 MG tablet Take 0.5-1 tablets (50-100 mg total) by mouth daily as needed for erectile dysfunction.  . sitaGLIPtin (JANUVIA) 100 MG tablet Take 1 tablet (100 mg total) by mouth daily.  [name] zoster vaccine live, PF, (ZOSTAVAX) 45809 UNT/0.65ML injection Inject 19,400 Units into the skin once.     REVIEW OF SYSTEMS  : All other systems reviewed and negative except where noted in the History of Present Illness.   PHYSICAL EXAM: BP 128/72  Pulse 68  Ht 5' 10.5" (1.791 m)  Wt 295 lb 9.6 oz (134.083 kg)  BMI 41.80 kg/m2 General: Well developed white male in no acute distress Head: Normocephalic and atraumatic Eyes:  Sclerae anicteric, conjunctiva pink. Ears: Normal auditory acuity  Lungs: Clear throughout to auscultation Heart: Regular rate and rhythm Abdomen: Soft, non-distended.  Normal bowel sounds.  Non-tender. Rectal:  Deferred.  Will be done at the time of colonoscopy. Musculoskeletal: Symmetrical with no gross deformities  Skin: No lesions on visible extremities Extremities: No edema  Neurological: Alert oriented x 4, grossly non-focal Psychological:  Alert and cooperative. Normal mood and affect  ASSESSMENT AND PLAN: -Screening colonoscopy:  Will schedule with Dr. Ardis Hughs. -GERD and dysphagia:  Will schedule for EGD with possible dilation as well.  Rule out GERD/esophagitis vs stricture vs Schatzki's ring, etc.  Will start omeprazole 40 mg daily in the interim.  The risks, benefits, and  alternatives were discussed with the patient and he consents to proceed.  The risks benefits and alternatives to a temporary hold of anti-coagulants/anti-platelets for the procedure were discussed with the patient and he consents to proceed. Obtain clearance from Dr. Everlene Farrier for ok to hold Xarelto.

## 2014-05-09 NOTE — Patient Instructions (Signed)
You have been scheduled for an endoscopy and colonoscopy. Please follow the written instructions given to you at your visit today. Please pick up your prep at the pharmacy within the next 1-3 days. If you use inhalers (even only as needed), please bring them with you on the day of your procedure. Your physician has requested that you go to www.startemmi.com and enter the access code given to you at your visit today. This web site gives a general overview about your procedure. However, you should still follow specific instructions given to you by our office regarding your preparation for the procedure.  We have sent the following medications to your pharmacy for you to pick up at your convenience: Omeprazole 40 mg, please take one capsule by mouth once daily

## 2014-05-09 NOTE — Telephone Encounter (Signed)
Called patient and advised patient per Dr. Everlene Farrier we have to cancel procedure because it will be to dangerous to hold Xarelto, residual clot. I advised patient once Dr. Everlene Farrier clears him then we can rescheduled. Patient verbalized understanding.

## 2014-05-09 NOTE — Progress Notes (Signed)
I agree with colonoscopy, EGD if ok with hold blood thinners per Dr. Everlene Farrier

## 2014-05-09 NOTE — Telephone Encounter (Signed)
I think we will have to hold off on his colonoscopy. He has residual clot in his left venous system. It would be dangerous for him to be off his anticoagulants at the present time. As soon as he clears his clot then we can schedule his colonoscopy    ----- Message -----    From: Carlyle Dolly, CMA    Sent: 05/09/2014 9:13 AM    To: Darlyne Russian, MD                                   [facility] Size     Small Medium Large Extra Extra Large                Adriana Simas Description: 63 year old male  05/09/2014 Telephone Provider: Carlyle Dolly, [place]  MRN: [id number] Department: [facility]             Reason for Call     Anticoagulation                Call Documentation     Darlyne Russian, MD at 05/09/2014 10:05 AM     Status: Signed        Please call telemetry at the GI office and advised her it is not safe for Mr. Tommy Yang to be off his Zarrella toe at the present time. Once he clears the clot in his leg and then we can schedule him for his colonoscopy

## 2014-05-09 NOTE — Telephone Encounter (Signed)
Please call telemetry at the GI office and advised her it is not safe for Tommy Yang to be off his Zarrella toe at the present time. Once he clears the clot in his leg and then we can schedule him for his colonoscopy

## 2014-05-09 NOTE — Addendum Note (Signed)
Addended by: Hope Pigeon A on: 05/09/2014 09:17 AM   Modules accepted: Orders

## 2014-05-09 NOTE — Telephone Encounter (Signed)
  05/09/2014   RE: Tommy Yang DOB: October 17, 1950 MRN: [id number]   Dear Everlene Farrier,    We have scheduled the above patient for an Colonoscopy/Endoscopy. Our records show that he is on anticoagulation therapy.   Please advise as to how long the patient may come off his therapy of Xarelto prior to the procedure, which is scheduled for 06-20-2014.  Please fax back/ or route the completed form to Evette Georges., CMA    Sincerely,    Hope Pigeon

## 2014-05-15 ENCOUNTER — Ambulatory Visit: Payer: BC Managed Care – PPO | Admitting: Cardiology

## 2014-06-20 ENCOUNTER — Encounter: Payer: BC Managed Care – PPO | Admitting: Gastroenterology

## 2014-07-11 ENCOUNTER — Encounter: Payer: Self-pay | Admitting: Radiation Oncology

## 2014-07-11 NOTE — Progress Notes (Signed)
GU Location of Tumor / Histology: prostatic adenocarcinoma  If Prostate Cancer, Gleason Score is (4 + 3) and PSA is (5.55 on 11/10/13)  Patient presented with an elevated PSA found during prostate screening.  Biopsies of prostate (if applicable) revealed:  04/05/45   Past/Anticipated interventions by urology, if any: biopsy and lupron  Past/Anticipated interventions by medical oncology, if any: none  Weight changes, if any:No  Bowel/Bladder complaints, if any: denies any changes in his voiding symptoms over the last six month and specifically denies dysuria or hematuria  Nausea/Vomiting, if any:No  Pain issues, if any:No  SAFETY ISSUES:  Prior radiation? no  Pacemaker/ICD? no  Possible current pregnancy? na  Is the patient on methotrexate? no  Current Complaints / other details: 63 year old male. Married. 1 son. Retired. No family history of prostate cancer. Medical history significant for diabetes and tourette's syndrome.  Consulted initially by Dr. Tammi Klippel on 01/02/2014 but, patient opted for prostatectomy. Patient was scheduled for RALP in June but, cancelled because of DVT and inability to stop anticoagulation. Patient remains on Xarelto. Dr. Louis Meckel gave six month shot of Lupron on 10/27.

## 2014-07-12 ENCOUNTER — Ambulatory Visit
Admission: RE | Admit: 2014-07-12 | Discharge: 2014-07-12 | Disposition: A | Discharge status: Home / Self Care | Payer: BC Managed Care – PPO | Source: Ambulatory Visit | Attending: Radiation Oncology | Admitting: Radiation Oncology

## 2014-07-12 ENCOUNTER — Encounter: Payer: Self-pay | Admitting: Radiation Oncology

## 2014-07-12 VITALS — BP 152/104 | HR 69 | Temp 98.0°F | Resp 18 | Wt 302.9 lb

## 2014-07-12 DIAGNOSIS — Z510 Encounter for antineoplastic radiation therapy: Secondary | ICD-10-CM | POA: Insufficient documentation

## 2014-07-12 DIAGNOSIS — C61 Malignant neoplasm of prostate: Secondary | ICD-10-CM | POA: Insufficient documentation

## 2014-07-12 NOTE — Progress Notes (Signed)
See progress note under physician encounter. 

## 2014-07-12 NOTE — Progress Notes (Signed)
Radiation Oncology         [phone] ________________________________  Name: Tommy Yang MRN: [id number]  Date: 07/12/2014  DOB: November 22, 1950  Follow-Up Visit Note  CC: Jenny Reichmann, MD  Ardis Hughs, MD  Diagnosis:    63 y.o. 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 Prostate cancer Ong     Narrative:  The patient returns today for routine follow-up.  He was previously seen in consultation.  To review, he was noted to have an elevated PSA of 5.55 by his primary care physician, Dr. Everlene Farrier.  Accordingly, he was referred for evaluation in urology by Dr. Louis Meckel on 11/10/13,  digital rectal examination was performed at that time revealing no nodules.  The patient proceeded to transrectal ultrasound with 12 biopsies of the prostate on 12/06/13.  The prostate volume measured 34.72 cc.  Hypoechoic areas were seen on the left base.  Out of 12 core biopsies, 6 were positive.  The maximum Gleason score was 4+3, and this was seen in the distribution displayed below:    The patient reviewed the biopsy results with his urologist and he has kindly been referred today for discussion of potential radiation treatment options.  At our previous visit, we reviewed the findings and workup.  We discussed the natural history of prostate cancer.  We reviewed the the implications of T-stage, Gleason's Score, and PSA on decision-making and outcomes related to prostate cancer.  We discussed radiation treatment in the management of prostate cancer with regard to the logistics and delivery of external beam radiation treatment.  The patient initially wanted to proceed with prostatectomy.  However, given his operative risk factors, he is now leaning toward radiation therapy.                                ALLERGIES:  has No Known Allergies.  Meds: Current Outpatient Prescriptions  Medication Sig Dispense  Refill  . atorvastatin (LIPITOR) 40 MG tablet Take 1 tablet (40 mg total) by mouth daily. 30 tablet 11  . glipiZIDE (GLUCOTROL XL) 5 MG 24 hr tablet Take 1 tablet (5 mg total) by mouth daily with breakfast. 30 tablet 11  . losartan (COZAAR) 50 MG tablet Take 1 tablet (50 mg total) by mouth daily. 90 tablet 3  . metFORMIN (GLUCOPHAGE) 1000 MG tablet Take 1 tablet (1,000 mg total) by mouth 2 (two) times daily with a meal. 60 tablet 11  . MOVIPREP 100 G SOLR Take 1 kit (200 g total) by mouth once. 1 kit 0  . omeprazole (PRILOSEC) 40 MG capsule Take 1 capsule (40 mg total) by mouth daily. 90 capsule 3  . rivaroxaban (XARELTO) 20 MG TABS tablet TAKE ONE TABLET BY MOUTH ONCE DAILY WITH  SUPPER. 30 tablet 11  . sildenafil (VIAGRA) 100 MG tablet Take 0.5-1 tablets (50-100 mg total) by mouth daily as needed for erectile dysfunction. 5 tablet 11  . sitaGLIPtin (JANUVIA) 100 MG tablet Take 1 tablet (100 mg total) by mouth daily. 30 tablet 11  . zoster vaccine live, PF, (ZOSTAVAX) 02774 UNT/0.65ML injection Inject 19,400 Units into the skin once. 1 each 0   No current facility-administered medications for this encounter.    Physical Findings: The patient is in no acute distress. Patient is alert and oriented.  weight is 302  lb 14.4 oz (137.395 kg). His oral temperature is 98 F (36.7 C). His blood pressure is 152/104 and his pulse is 69. His respiration is 18 and oxygen saturation is 100%. .  No significant changes.  Impression:  The patient is a very nice 63 y.o. 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  He falls into an intermediate risk category based on his Gleason score. At this point, the patient has rule out prostatectomy given the potential surgical risks. He would be eligible for external beam radiation treatment with androgen deprivation.  Plan:  We discussed the variety of potential options available again today reviewing the pros  and cons of each. The patient would like to proceed with radiation treatment. He started androgen deprivation and should be ready to undergo placement of 3 gold fiducial markers in early January 2016 be followed by external beam radiotherapy.  _____________________________________  Sheral Apley. Tammi Klippel, M.D.

## 2014-07-13 ENCOUNTER — Telehealth: Payer: Self-pay | Admitting: *Deleted

## 2014-07-13 NOTE — Telephone Encounter (Signed)
Called patient to inform of gold seed placement on 09/04/14- arrival time - 9 am @ Dr. Carlton Adam Office, and his sim on 09-08-14 @  2 pm @ Dr. Johny Shears Office, spoke with patient and he is aware of these appts.

## 2014-09-08 ENCOUNTER — Encounter: Payer: Self-pay | Admitting: Radiation Oncology

## 2014-09-08 ENCOUNTER — Ambulatory Visit
Admission: RE | Admit: 2014-09-08 | Discharge: 2014-09-08 | Disposition: A | Discharge status: Home / Self Care | Payer: BLUE CROSS/BLUE SHIELD | Source: Ambulatory Visit | Attending: Radiation Oncology | Admitting: Radiation Oncology

## 2014-09-08 DIAGNOSIS — C61 Malignant neoplasm of prostate: Secondary | ICD-10-CM | POA: Diagnosis not present

## 2014-09-08 DIAGNOSIS — Z510 Encounter for antineoplastic radiation therapy: Secondary | ICD-10-CM | POA: Diagnosis present

## 2014-09-08 NOTE — Progress Notes (Signed)
Patient dropped of critical illness paperwork.  Forwarded paperwork to Sam, Therapist, sports for processing.

## 2014-09-08 NOTE — Progress Notes (Signed)
  Radiation Oncology         [phone] ________________________________  Name: Tommy Yang MRN: [id number]  Date: 09/08/2014  DOB: Sep 18, 1950  SIMULATION AND TREATMENT PLANNING NOTE    ICD-9-CM ICD-10-CM   1. Prostate cancer 185 C61     DIAGNOSIS:  64 y.o. 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  Identity was confirmed.  All relevant records and images related to the planned course of therapy were reviewed.  The patient freely provided informed written consent to proceed with treatment after reviewing the details related to the planned course of therapy. The consent form was witnessed and verified by the simulation staff.  Then, the patient was set-up in a stable reproducible supine position for radiation therapy.  A vacuum lock pillow device was custom fabricated to position his legs in a reproducible immobilized position.  Then, I performed a urethrogram under sterile conditions to identify the prostatic apex.  CT images were obtained.  Surface markings were placed.  The CT images were loaded into the planning software.  Then the prostate target and avoidance structures including the rectum, bladder, bowel and hips were contoured.  Treatment planning then occurred.  The radiation prescription was entered and confirmed.  A total of one complex treatment was fabricated. I have requested : Intensity Modulated Radiotherapy (IMRT) is medically necessary for this case for the following reason:  Rectal sparing.[name]  PLAN:  The patient will receive 78 Gy in 40 fractions.  ________________________________  Sheral Apley Tammi Klippel, M.D.

## 2014-09-11 ENCOUNTER — Telehealth: Payer: Self-pay | Admitting: Radiation Oncology

## 2014-09-11 NOTE — Telephone Encounter (Signed)
Placed orange folder with complete [employer] Cancer Claim paperwork in Dr. Johny Shears inbox to sign.

## 2014-09-14 ENCOUNTER — Telehealth: Payer: Self-pay | Admitting: Radiation Oncology

## 2014-09-14 NOTE — Telephone Encounter (Signed)
Returned orange folder to [employer] with complete and signed cancer policy paperwork.

## 2014-09-15 ENCOUNTER — Encounter: Payer: Self-pay | Admitting: Radiation Oncology

## 2014-09-15 DIAGNOSIS — Z510 Encounter for antineoplastic radiation therapy: Secondary | ICD-10-CM | POA: Diagnosis not present

## 2014-09-15 NOTE — Progress Notes (Signed)
Faxed completed critical illness form to [facility]

## 2014-09-19 ENCOUNTER — Ambulatory Visit
Admission: RE | Admit: 2014-09-19 | Discharge: 2014-09-19 | Disposition: A | Discharge status: Home / Self Care | Payer: BLUE CROSS/BLUE SHIELD | Source: Ambulatory Visit | Attending: Radiation Oncology | Admitting: Radiation Oncology

## 2014-09-19 DIAGNOSIS — Z510 Encounter for antineoplastic radiation therapy: Secondary | ICD-10-CM | POA: Diagnosis not present

## 2014-09-20 ENCOUNTER — Ambulatory Visit
Admission: RE | Admit: 2014-09-20 | Discharge: 2014-09-20 | Disposition: A | Discharge status: Home / Self Care | Payer: BLUE CROSS/BLUE SHIELD | Source: Ambulatory Visit | Attending: Radiation Oncology | Admitting: Radiation Oncology

## 2014-09-20 DIAGNOSIS — Z510 Encounter for antineoplastic radiation therapy: Secondary | ICD-10-CM | POA: Diagnosis not present

## 2014-09-21 ENCOUNTER — Ambulatory Visit
Admission: RE | Admit: 2014-09-21 | Discharge: 2014-09-21 | Disposition: A | Discharge status: Home / Self Care | Payer: BLUE CROSS/BLUE SHIELD | Source: Ambulatory Visit | Attending: Radiation Oncology | Admitting: Radiation Oncology

## 2014-09-21 ENCOUNTER — Encounter: Payer: Self-pay | Admitting: Radiation Oncology

## 2014-09-21 VITALS — BP 166/100 | HR 71 | Resp 16 | Wt 308.6 lb

## 2014-09-21 DIAGNOSIS — C61 Malignant neoplasm of prostate: Secondary | ICD-10-CM

## 2014-09-21 DIAGNOSIS — Z510 Encounter for antineoplastic radiation therapy: Secondary | ICD-10-CM | POA: Diagnosis not present

## 2014-09-21 NOTE — Progress Notes (Signed)
  Radiation Oncology         [phone] ________________________________  Name: Tommy Yang MRN: [id number]  Date: 09/21/2014  DOB: August 02, 1951  Weekly Radiation Therapy Management    ICD-9-CM ICD-10-CM   1. Prostate cancer 185 C61     Current Dose: 5.85 Gy     Planned Dose:  78 Gy  Narrative . . . . . . . . The patient presents for routine under treatment assessment.                                   The patient is without complaint.                                 Set-up films were reviewed.                                 The chart was checked. Physical Findings. . .  weight is 308 lb 9.6 oz (139.98 kg). His blood pressure is 166/100 and his pulse is 71. His respiration is 16. . Weight essentially stable.  No significant changes. Impression . . . . . . . The patient is tolerating radiation. Plan . . . . . . . . . . . . Continue treatment as planned.  ________________________________  Sheral Apley. Tammi Klippel, M.D.

## 2014-09-21 NOTE — Progress Notes (Signed)
Reports nocturia x2. Denies hematuria or dysuria. Describes a strong steady urine stream. Denies difficulty emptying his bladder. Denies fatigue or pain. Weight and vitals stable.

## 2014-09-22 ENCOUNTER — Ambulatory Visit
Admission: RE | Admit: 2014-09-22 | Discharge: 2014-09-22 | Disposition: A | Discharge status: Home / Self Care | Payer: BLUE CROSS/BLUE SHIELD | Source: Ambulatory Visit | Attending: Radiation Oncology | Admitting: Radiation Oncology

## 2014-09-22 DIAGNOSIS — Z510 Encounter for antineoplastic radiation therapy: Secondary | ICD-10-CM | POA: Diagnosis not present

## 2014-09-25 ENCOUNTER — Ambulatory Visit
Admission: RE | Admit: 2014-09-25 | Discharge: 2014-09-25 | Disposition: A | Discharge status: Home / Self Care | Payer: BLUE CROSS/BLUE SHIELD | Source: Ambulatory Visit | Attending: Radiation Oncology | Admitting: Radiation Oncology

## 2014-09-25 DIAGNOSIS — Z510 Encounter for antineoplastic radiation therapy: Secondary | ICD-10-CM | POA: Diagnosis not present

## 2014-09-25 NOTE — Addendum Note (Signed)
Encounter addended by: Heywood Footman, RN on: 09/25/2014  9:22 AM<BR>     Documentation filed: Inpatient Document Flowsheet, Inpatient Patient Education, Notes Section

## 2014-09-25 NOTE — Progress Notes (Signed)
Late entry from 09/21/2014. Oriented patient to staff and routine of the clinic. Provided patient with RADIATION THERAPY AND YOU handbook then, reviewed pertinent information. Educated patient reference potential side effects and management such as, fatigue, urinary/bladder changes and diarrhea. Allowed patient the opportunity to ask questions and answered those to the best of my ability. Patient verbalized understanding of all reviewed.

## 2014-09-26 ENCOUNTER — Ambulatory Visit
Admission: RE | Admit: 2014-09-26 | Discharge: 2014-09-26 | Disposition: A | Discharge status: Home / Self Care | Payer: BLUE CROSS/BLUE SHIELD | Source: Ambulatory Visit | Attending: Radiation Oncology | Admitting: Radiation Oncology

## 2014-09-26 DIAGNOSIS — Z510 Encounter for antineoplastic radiation therapy: Secondary | ICD-10-CM | POA: Diagnosis not present

## 2014-09-27 ENCOUNTER — Ambulatory Visit
Admission: RE | Admit: 2014-09-27 | Discharge: 2014-09-27 | Disposition: A | Discharge status: Home / Self Care | Payer: BLUE CROSS/BLUE SHIELD | Source: Ambulatory Visit | Attending: Radiation Oncology | Admitting: Radiation Oncology

## 2014-09-27 DIAGNOSIS — Z510 Encounter for antineoplastic radiation therapy: Secondary | ICD-10-CM | POA: Diagnosis not present

## 2014-09-28 ENCOUNTER — Ambulatory Visit
Admission: RE | Admit: 2014-09-28 | Discharge: 2014-09-28 | Disposition: A | Discharge status: Home / Self Care | Payer: BLUE CROSS/BLUE SHIELD | Source: Ambulatory Visit | Attending: Radiation Oncology | Admitting: Radiation Oncology

## 2014-09-28 DIAGNOSIS — Z510 Encounter for antineoplastic radiation therapy: Secondary | ICD-10-CM | POA: Diagnosis not present

## 2014-09-29 ENCOUNTER — Ambulatory Visit
Admission: RE | Admit: 2014-09-29 | Discharge: 2014-09-29 | Disposition: A | Discharge status: Home / Self Care | Payer: BLUE CROSS/BLUE SHIELD | Source: Ambulatory Visit | Attending: Radiation Oncology | Admitting: Radiation Oncology

## 2014-09-29 VITALS — BP 154/74 | HR 62 | Temp 98.2°F | Resp 20 | Ht 70.5 in | Wt 308.3 lb

## 2014-09-29 DIAGNOSIS — Z510 Encounter for antineoplastic radiation therapy: Secondary | ICD-10-CM | POA: Diagnosis not present

## 2014-09-29 DIAGNOSIS — C61 Malignant neoplasm of prostate: Secondary | ICD-10-CM

## 2014-09-29 NOTE — Progress Notes (Signed)
  Radiation Oncology         [phone] ________________________________  Name: ROEY COOPMAN MRN: [id number]  Date: 09/29/2014  DOB: 1950-09-21  Weekly Radiation Therapy Management  No diagnosis found.  Current Dose: 17.55 Gy     Planned Dose:  78 Gy  Narrative . . . . . . . . The patient presents for routine under treatment assessment.                                   Seung Nidiffer has completed 9 fractions to his prostate.  He denies pain, dysuria, hematuria and skin irritation.  He has noticed that he is urinating more frequently.  He reports urinating twice per night.  He reports his stools are softer.  He reports fatigue.                                 Set-up films were reviewed.                                 The chart was checked. Physical Findings. . .  height is 5' 10.5" (1.791 m) and weight is 308 lb 4.8 oz (139.844 kg). His oral temperature is 98.2 F (36.8 C). His blood pressure is 154/74 and his pulse is 62. His respiration is 20. . Weight essentially stable.  No significant changes. Impression . . . . . . . The patient is tolerating radiation. Plan . . . . . . . . . . . . Continue treatment as planned.  ________________________________  Sheral Apley. Tammi Klippel, M.D.

## 2014-09-29 NOTE — Progress Notes (Signed)
Tommy Yang has completed 9 fractions to his prostate.  He denies pain, dysuria, hematuria and skin irritation.  He has noticed that he is urinating more frequently.  He reports urinating twice per night.  He reports his stools are softer.  He reports fatigue.

## 2014-10-02 ENCOUNTER — Ambulatory Visit
Admission: RE | Admit: 2014-10-02 | Discharge: 2014-10-02 | Disposition: A | Discharge status: Home / Self Care | Payer: BLUE CROSS/BLUE SHIELD | Source: Ambulatory Visit | Attending: Radiation Oncology | Admitting: Radiation Oncology

## 2014-10-02 DIAGNOSIS — Z510 Encounter for antineoplastic radiation therapy: Secondary | ICD-10-CM | POA: Diagnosis not present

## 2014-10-03 ENCOUNTER — Ambulatory Visit
Admission: RE | Admit: 2014-10-03 | Discharge: 2014-10-03 | Disposition: A | Discharge status: Home / Self Care | Payer: BLUE CROSS/BLUE SHIELD | Source: Ambulatory Visit | Attending: Radiation Oncology | Admitting: Radiation Oncology

## 2014-10-03 ENCOUNTER — Telehealth: Payer: Self-pay

## 2014-10-03 DIAGNOSIS — Z86718 Personal history of other venous thrombosis and embolism: Secondary | ICD-10-CM

## 2014-10-03 DIAGNOSIS — I82502 Chronic embolism and thrombosis of unspecified deep veins of left lower extremity: Secondary | ICD-10-CM

## 2014-10-03 DIAGNOSIS — Z510 Encounter for antineoplastic radiation therapy: Secondary | ICD-10-CM | POA: Diagnosis not present

## 2014-10-03 NOTE — Telephone Encounter (Signed)
Dr. Everlene Farrier did you want to order this?

## 2014-10-03 NOTE — Telephone Encounter (Signed)
Please order a Doppler of his left leg follow-up clot

## 2014-10-03 NOTE — Telephone Encounter (Signed)
Pt called wanting Dr. Everlene Farrier to agree for him to get a doppler test for the blood clot in his leg. CB# [phone]

## 2014-10-04 ENCOUNTER — Ambulatory Visit
Admission: RE | Admit: 2014-10-04 | Discharge: 2014-10-04 | Disposition: A | Discharge status: Home / Self Care | Payer: BLUE CROSS/BLUE SHIELD | Source: Ambulatory Visit | Attending: Radiation Oncology | Admitting: Radiation Oncology

## 2014-10-04 DIAGNOSIS — Z510 Encounter for antineoplastic radiation therapy: Secondary | ICD-10-CM | POA: Diagnosis not present

## 2014-10-04 NOTE — Telephone Encounter (Signed)
Ordered doppler, pt notified.

## 2014-10-05 ENCOUNTER — Ambulatory Visit
Admission: RE | Admit: 2014-10-05 | Discharge: 2014-10-05 | Disposition: A | Discharge status: Home / Self Care | Payer: BLUE CROSS/BLUE SHIELD | Source: Ambulatory Visit | Attending: Radiation Oncology | Admitting: Radiation Oncology

## 2014-10-05 DIAGNOSIS — Z510 Encounter for antineoplastic radiation therapy: Secondary | ICD-10-CM | POA: Diagnosis not present

## 2014-10-06 ENCOUNTER — Encounter: Payer: Self-pay | Admitting: Radiation Oncology

## 2014-10-06 ENCOUNTER — Ambulatory Visit
Admission: RE | Admit: 2014-10-06 | Discharge: 2014-10-06 | Disposition: A | Discharge status: Home / Self Care | Payer: BLUE CROSS/BLUE SHIELD | Source: Ambulatory Visit | Attending: Radiation Oncology | Admitting: Radiation Oncology

## 2014-10-06 ENCOUNTER — Ambulatory Visit (HOSPITAL_COMMUNITY): Payer: BLUE CROSS/BLUE SHIELD

## 2014-10-06 VITALS — BP 159/80 | HR 65 | Resp 16 | Wt 308.9 lb

## 2014-10-06 DIAGNOSIS — C61 Malignant neoplasm of prostate: Secondary | ICD-10-CM

## 2014-10-06 DIAGNOSIS — Z510 Encounter for antineoplastic radiation therapy: Secondary | ICD-10-CM | POA: Diagnosis not present

## 2014-10-06 NOTE — Progress Notes (Signed)
Denies dysuria but, does report some irritation at the end of each void. Reports slender soft bowel movements. Reports increased frequency of bowel movements. Denies hematuria. Reports nocturia x3. Reports mild fatigue. Reports while he doesn't have the urge to void but, when he stands he often feels like he needs to void.

## 2014-10-08 NOTE — Progress Notes (Signed)
  Radiation Oncology         [phone] ________________________________  Name: Tommy Yang MRN: [id number]  Date: 10/06/2014  DOB: 11-08-1950  Weekly Radiation Therapy Management    ICD-9-CM ICD-10-CM   1. Prostate cancer 185 C61     Current Dose: 27.3 Gy     Planned Dose:  78 Gy  Narrative . . . . . . . . The patient presents for routine under treatment assessment.                                   Denies dysuria but, does report some irritation at the end of each void. Reports slender soft bowel movements. Reports increased frequency of bowel movements. Denies hematuria. Reports nocturia x3. Reports mild fatigue. Reports while he doesn't have the urge to void but, when he stands he often feels like he needs to void.                                  Set-up films were reviewed.                                 The chart was checked. Physical Findings. . .  weight is 308 lb 14.4 oz (140.116 kg). His blood pressure is 159/80 and his pulse is 65. His respiration is 16. . Weight essentially stable.  No significant changes. Impression . . . . . . . The patient is tolerating radiation. Plan . . . . . . . . . . . . Continue treatment as planned.  ________________________________  Sheral Apley. Tammi Klippel, M.D.

## 2014-10-09 ENCOUNTER — Ambulatory Visit
Admission: RE | Admit: 2014-10-09 | Discharge: 2014-10-09 | Disposition: A | Discharge status: Home / Self Care | Payer: BLUE CROSS/BLUE SHIELD | Source: Ambulatory Visit | Attending: Radiation Oncology | Admitting: Radiation Oncology

## 2014-10-09 DIAGNOSIS — Z510 Encounter for antineoplastic radiation therapy: Secondary | ICD-10-CM | POA: Diagnosis not present

## 2014-10-10 ENCOUNTER — Ambulatory Visit
Admission: RE | Admit: 2014-10-10 | Discharge: 2014-10-10 | Disposition: A | Discharge status: Home / Self Care | Payer: BLUE CROSS/BLUE SHIELD | Source: Ambulatory Visit | Attending: Radiation Oncology | Admitting: Radiation Oncology

## 2014-10-10 DIAGNOSIS — Z510 Encounter for antineoplastic radiation therapy: Secondary | ICD-10-CM | POA: Diagnosis not present

## 2014-10-11 ENCOUNTER — Ambulatory Visit
Admission: RE | Admit: 2014-10-11 | Discharge: 2014-10-11 | Disposition: A | Discharge status: Home / Self Care | Payer: BLUE CROSS/BLUE SHIELD | Source: Ambulatory Visit | Attending: Radiation Oncology | Admitting: Radiation Oncology

## 2014-10-11 DIAGNOSIS — Z510 Encounter for antineoplastic radiation therapy: Secondary | ICD-10-CM | POA: Diagnosis not present

## 2014-10-12 ENCOUNTER — Ambulatory Visit
Admission: RE | Admit: 2014-10-12 | Discharge: 2014-10-12 | Disposition: A | Discharge status: Home / Self Care | Payer: BLUE CROSS/BLUE SHIELD | Source: Ambulatory Visit | Attending: Radiation Oncology | Admitting: Radiation Oncology

## 2014-10-12 DIAGNOSIS — Z510 Encounter for antineoplastic radiation therapy: Secondary | ICD-10-CM | POA: Diagnosis not present

## 2014-10-13 ENCOUNTER — Ambulatory Visit
Admission: RE | Admit: 2014-10-13 | Discharge: 2014-10-13 | Disposition: A | Discharge status: Home / Self Care | Payer: BLUE CROSS/BLUE SHIELD | Source: Ambulatory Visit | Attending: Radiation Oncology | Admitting: Radiation Oncology

## 2014-10-13 ENCOUNTER — Encounter: Payer: Self-pay | Admitting: Radiation Oncology

## 2014-10-13 VITALS — BP 164/80 | HR 73 | Resp 16 | Wt 308.7 lb

## 2014-10-13 DIAGNOSIS — C61 Malignant neoplasm of prostate: Secondary | ICD-10-CM

## 2014-10-13 DIAGNOSIS — Z510 Encounter for antineoplastic radiation therapy: Secondary | ICD-10-CM | POA: Diagnosis not present

## 2014-10-13 NOTE — Progress Notes (Signed)
Denies dysuria but, does reports continuing to feel a twinge at the end of each void. Reports stool is soft but, denies diarrhea. Reports increased fatigue that he is managing well. Denies hematuria. Reports nocturia x3. Weight and vitals stable.

## 2014-10-13 NOTE — Progress Notes (Signed)
  Radiation Oncology         [phone] ________________________________  Name: Tommy Yang MRN: [id number]  Date: 10/13/2014  DOB: Jul 20, 1951  Weekly Radiation Therapy Management    ICD-9-CM ICD-10-CM   1. Prostate cancer 185 C61     Current Dose: 37.05 Gy     Planned Dose:  78 Gy  Narrative . . . . . . . . The patient presents for routine under treatment assessment.                                   Denies dysuria but, does reports continuing to feel a twinge at the end of each void. Reports stool is soft but, denies diarrhea. Reports increased fatigue that he is managing well. Denies hematuria. Reports nocturia x3. Weight and vitals stable                                 Set-up films were reviewed.                                 The chart was checked. Physical Findings. . .  weight is 308 lb 11.2 oz (140.025 kg). His blood pressure is 164/80 and his pulse is 73. His respiration is 16. . Weight essentially stable.  No significant changes. Impression . . . . . . . The patient is tolerating radiation. Plan . . . . . . . . . . . . Continue treatment as planned.  ________________________________  Tommy Yang. Tommy Yang, M.D.

## 2014-10-16 ENCOUNTER — Ambulatory Visit
Admission: RE | Admit: 2014-10-16 | Discharge: 2014-10-16 | Disposition: A | Discharge status: Home / Self Care | Payer: BLUE CROSS/BLUE SHIELD | Source: Ambulatory Visit | Attending: Radiation Oncology | Admitting: Radiation Oncology

## 2014-10-16 DIAGNOSIS — Z510 Encounter for antineoplastic radiation therapy: Secondary | ICD-10-CM | POA: Diagnosis not present

## 2014-10-17 ENCOUNTER — Ambulatory Visit
Admission: RE | Admit: 2014-10-17 | Discharge: 2014-10-17 | Disposition: A | Discharge status: Home / Self Care | Payer: BLUE CROSS/BLUE SHIELD | Source: Ambulatory Visit | Attending: Radiation Oncology | Admitting: Radiation Oncology

## 2014-10-17 DIAGNOSIS — Z510 Encounter for antineoplastic radiation therapy: Secondary | ICD-10-CM | POA: Diagnosis not present

## 2014-10-18 ENCOUNTER — Ambulatory Visit
Admission: RE | Admit: 2014-10-18 | Discharge: 2014-10-18 | Disposition: A | Discharge status: Home / Self Care | Payer: BLUE CROSS/BLUE SHIELD | Source: Ambulatory Visit | Attending: Radiation Oncology | Admitting: Radiation Oncology

## 2014-10-18 ENCOUNTER — Telehealth: Payer: Self-pay

## 2014-10-18 ENCOUNTER — Other Ambulatory Visit: Payer: Self-pay | Admitting: Emergency Medicine

## 2014-10-18 DIAGNOSIS — Z510 Encounter for antineoplastic radiation therapy: Secondary | ICD-10-CM | POA: Diagnosis not present

## 2014-10-18 NOTE — Telephone Encounter (Signed)
Pt would like to have the generic LIPITOR called in to his pharmacy. Please call pt at [facility]

## 2014-10-19 ENCOUNTER — Ambulatory Visit
Admission: RE | Admit: 2014-10-19 | Discharge: 2014-10-19 | Disposition: A | Discharge status: Home / Self Care | Payer: BLUE CROSS/BLUE SHIELD | Source: Ambulatory Visit | Attending: Radiation Oncology | Admitting: Radiation Oncology

## 2014-10-19 DIAGNOSIS — Z510 Encounter for antineoplastic radiation therapy: Secondary | ICD-10-CM | POA: Diagnosis not present

## 2014-10-19 NOTE — Progress Notes (Signed)
  Radiation Oncology         [phone] ________________________________  Name: GURVEER COLUCCI MRN: [id number]  Date: 10/20/2014  DOB: [birth year]  Weekly Radiation Therapy Management    ICD-9-CM ICD-10-CM   1. Prostate cancer 185 C61     Current Dose: 46.8 Gy     Planned Dose:  78 Gy  Narrative . . . . . . . . The patient presents for routine under treatment assessment.                                   Mr. Yusko reports complete emptying of bladder with nocturia X 2 on average. Denies any dysuria. He reports that his stools are soft and "thinner and softer". He denies any diarrhea or loose stools                                 Set-up films were reviewed.                                 The chart was checked. Physical Findings. . .  vitals were not taken for this visit.. Weight essentially stable.  No significant changes. Impression . . . . . . . The patient is tolerating radiation. Plan . . . . . . . . . . . . Continue treatment as planned.  ________________________________  Sheral Apley. Tammi Klippel, M.D.

## 2014-10-20 ENCOUNTER — Ambulatory Visit
Admission: RE | Admit: 2014-10-20 | Discharge: 2014-10-20 | Disposition: A | Discharge status: Home / Self Care | Payer: BLUE CROSS/BLUE SHIELD | Source: Ambulatory Visit | Attending: Radiation Oncology | Admitting: Radiation Oncology

## 2014-10-20 ENCOUNTER — Telehealth: Payer: Self-pay | Admitting: Emergency Medicine

## 2014-10-20 DIAGNOSIS — C61 Malignant neoplasm of prostate: Secondary | ICD-10-CM

## 2014-10-20 DIAGNOSIS — Z510 Encounter for antineoplastic radiation therapy: Secondary | ICD-10-CM | POA: Diagnosis not present

## 2014-10-20 NOTE — Progress Notes (Signed)
Tommy Yang reports complete emptying of bladder with nocturia  X 2 on average.  Denies any dysuria.  He reports that his stools are soft and "thinner and softer".  He denies any diarrhea or loose stools.

## 2014-10-20 NOTE — Telephone Encounter (Signed)
This was sent in on 10/18/14. Called pt to advise and LMOM that he needs OV for more.

## 2014-10-21 NOTE — Telephone Encounter (Signed)
Patient called requesting a refill on Losartan 50 MG. [facility] [address]. Patient stated he will come in on the 24th to see Dr. Everlene Farrier.

## 2014-10-23 ENCOUNTER — Ambulatory Visit
Admission: RE | Admit: 2014-10-23 | Discharge: 2014-10-23 | Disposition: A | Discharge status: Home / Self Care | Payer: BLUE CROSS/BLUE SHIELD | Source: Ambulatory Visit | Attending: Radiation Oncology | Admitting: Radiation Oncology

## 2014-10-23 DIAGNOSIS — Z510 Encounter for antineoplastic radiation therapy: Secondary | ICD-10-CM | POA: Diagnosis not present

## 2014-10-24 ENCOUNTER — Ambulatory Visit
Admission: RE | Admit: 2014-10-24 | Discharge: 2014-10-24 | Disposition: A | Discharge status: Home / Self Care | Payer: BLUE CROSS/BLUE SHIELD | Source: Ambulatory Visit | Attending: Radiation Oncology | Admitting: Radiation Oncology

## 2014-10-24 DIAGNOSIS — Z510 Encounter for antineoplastic radiation therapy: Secondary | ICD-10-CM | POA: Diagnosis not present

## 2014-10-24 NOTE — Telephone Encounter (Signed)
Spoke with pt, advised Rx sent to the pharmacy. 

## 2014-10-25 ENCOUNTER — Ambulatory Visit
Admission: RE | Admit: 2014-10-25 | Discharge: 2014-10-25 | Disposition: A | Discharge status: Home / Self Care | Payer: BLUE CROSS/BLUE SHIELD | Source: Ambulatory Visit | Attending: Radiation Oncology | Admitting: Radiation Oncology

## 2014-10-25 DIAGNOSIS — Z510 Encounter for antineoplastic radiation therapy: Secondary | ICD-10-CM | POA: Diagnosis not present

## 2014-10-26 ENCOUNTER — Ambulatory Visit
Admission: RE | Admit: 2014-10-26 | Discharge: 2014-10-26 | Disposition: A | Discharge status: Home / Self Care | Payer: BLUE CROSS/BLUE SHIELD | Source: Ambulatory Visit | Attending: Radiation Oncology | Admitting: Radiation Oncology

## 2014-10-26 DIAGNOSIS — Z510 Encounter for antineoplastic radiation therapy: Secondary | ICD-10-CM | POA: Diagnosis not present

## 2014-10-27 ENCOUNTER — Ambulatory Visit
Admission: RE | Admit: 2014-10-27 | Discharge: 2014-10-27 | Disposition: A | Discharge status: Home / Self Care | Payer: BLUE CROSS/BLUE SHIELD | Source: Ambulatory Visit | Attending: Radiation Oncology | Admitting: Radiation Oncology

## 2014-10-27 ENCOUNTER — Encounter: Payer: Self-pay | Admitting: Radiation Oncology

## 2014-10-27 VITALS — BP 155/72 | HR 70 | Resp 16 | Wt 308.0 lb

## 2014-10-27 DIAGNOSIS — C61 Malignant neoplasm of prostate: Secondary | ICD-10-CM

## 2014-10-27 DIAGNOSIS — Z510 Encounter for antineoplastic radiation therapy: Secondary | ICD-10-CM | POA: Diagnosis not present

## 2014-10-27 NOTE — Progress Notes (Signed)
Denies difficulty emptying his bladder. Denies hematuria. Reports nocturia x2. Reports mild dysuria. Denies diarrhea but, does reports soft stool. Denies fatigue. Weight and vitals stable. Reports occasional urgency. Denies pain.

## 2014-10-27 NOTE — Progress Notes (Signed)
  Radiation Oncology         [phone] ________________________________  Name: Tommy Yang MRN: [id number]  Date: 10/27/2014  DOB: 1951/07/26  Weekly Radiation Therapy Management  No diagnosis found.  Current Dose: 56.55 Gy     Planned Dose:  78 Gy  Narrative . . . . . . . . The patient presents for routine under treatment assessment.                                  Denies difficulty emptying his bladder. Denies hematuria. Reports nocturia x2. Reports mild dysuria. Denies diarrhea but, does reports soft stool. Denies fatigue. Weight and vitals stable. Reports occasional urgency. Denies pain.                                 Set-up films were reviewed.                                 The chart was checked. Physical Findings. . .  weight is 308 lb (139.708 kg). His blood pressure is 155/72 and his pulse is 70. His respiration is 16. . Weight essentially stable.  No significant changes. Impression . . . . . . . The patient is tolerating radiation. Plan . . . . . . . . . . . . Continue treatment as planned.  ________________________________  Sheral Apley. Tammi Klippel, M.D.

## 2014-10-30 ENCOUNTER — Ambulatory Visit
Admission: RE | Admit: 2014-10-30 | Discharge: 2014-10-30 | Disposition: A | Discharge status: Home / Self Care | Payer: BLUE CROSS/BLUE SHIELD | Source: Ambulatory Visit | Attending: Radiation Oncology | Admitting: Radiation Oncology

## 2014-10-30 DIAGNOSIS — Z510 Encounter for antineoplastic radiation therapy: Secondary | ICD-10-CM | POA: Diagnosis not present

## 2014-10-31 ENCOUNTER — Ambulatory Visit (INDEPENDENT_AMBULATORY_CARE_PROVIDER_SITE_OTHER): Payer: BLUE CROSS/BLUE SHIELD | Admitting: Emergency Medicine

## 2014-10-31 ENCOUNTER — Encounter: Payer: Self-pay | Admitting: Emergency Medicine

## 2014-10-31 ENCOUNTER — Ambulatory Visit
Admission: RE | Admit: 2014-10-31 | Discharge: 2014-10-31 | Disposition: A | Discharge status: Home / Self Care | Payer: BLUE CROSS/BLUE SHIELD | Source: Ambulatory Visit | Attending: Radiation Oncology | Admitting: Radiation Oncology

## 2014-10-31 VITALS — BP 149/79 | HR 76 | Temp 97.7°F | Resp 16 | Ht 70.0 in | Wt 305.0 lb

## 2014-10-31 DIAGNOSIS — I82402 Acute embolism and thrombosis of unspecified deep veins of left lower extremity: Secondary | ICD-10-CM

## 2014-10-31 DIAGNOSIS — C61 Malignant neoplasm of prostate: Secondary | ICD-10-CM | POA: Diagnosis not present

## 2014-10-31 DIAGNOSIS — E119 Type 2 diabetes mellitus without complications: Secondary | ICD-10-CM | POA: Diagnosis not present

## 2014-10-31 DIAGNOSIS — Z510 Encounter for antineoplastic radiation therapy: Secondary | ICD-10-CM | POA: Diagnosis not present

## 2014-10-31 DIAGNOSIS — Z13220 Encounter for screening for lipoid disorders: Secondary | ICD-10-CM

## 2014-10-31 LAB — POCT GLYCOSYLATED HEMOGLOBIN (HGB A1C): Hemoglobin A1C: 9.1

## 2014-10-31 LAB — COMPLETE METABOLIC PANEL WITH GFR
ALT: 25 U/L (ref 0–53)
AST: 16 U/L (ref 0–37)
Albumin: 4.1 g/dL (ref 3.5–5.2)
Alkaline Phosphatase: 73 U/L (ref 39–117)
BUN: 16 mg/dL (ref 6–23)
CO2: 24 mEq/L (ref 19–32)
Calcium: 9.4 mg/dL (ref 8.4–10.5)
Chloride: 101 mEq/L (ref 96–112)
Creat: 0.84 mg/dL (ref 0.50–1.35)
GFR, Est African American: >89 mL/min
GFR, Est Non African American: >89 mL/min
Glucose, Bld: 155 mg/dL — ABNORMAL HIGH (ref 70–99)
Potassium: 4.5 mEq/L (ref 3.5–5.3)
Sodium: 142 mEq/L (ref 135–145)
Total Bilirubin: 0.6 mg/dL (ref 0.2–1.2)
Total Protein: 6.8 g/dL (ref 6.0–8.3)

## 2014-10-31 LAB — LIPID PANEL
Cholesterol: 193 mg/dL (ref 0–200)
HDL: 35 mg/dL — ABNORMAL LOW (ref >=40)
LDL Cholesterol: 98 mg/dL (ref 0–99)
Total CHOL/HDL Ratio: 5.5 Ratio
Triglycerides: 302 mg/dL — ABNORMAL HIGH (ref <150)
VLDL: 60 mg/dL — ABNORMAL HIGH (ref 0–40)

## 2014-10-31 LAB — GLUCOSE, POCT (MANUAL RESULT ENTRY): POC Glucose: 165 mg/dl — AB (ref 70–99)

## 2014-10-31 MED ORDER — ATORVASTATIN CALCIUM 40 MG PO TABS: ORAL_TABLET | ORAL | Status: DC

## 2014-10-31 MED ORDER — BLOOD GLUCOSE MONITOR KIT: PACK | Status: DC

## 2014-10-31 MED ORDER — LOSARTAN POTASSIUM 100 MG PO TABS: ORAL_TABLET | ORAL | Status: DC

## 2014-10-31 MED ORDER — GLIPIZIDE ER 10 MG PO TB24: 10.0000 mg | ORAL_TABLET | Freq: Every day | ORAL | Status: DC

## 2014-10-31 NOTE — Patient Instructions (Signed)
I have increased the dosage of your blood pressure medicine. I have increased the dosage of one of your diabetes medicines. He will be scheduled for a Doppler of your left leg. Recheck at our office after your Doppler.

## 2014-10-31 NOTE — Progress Notes (Signed)
   Subjective:    Patient ID: Tommy Yang, male    DOB: 24-Apr-1951, 64 y.o.   MRN: [id number] This chart was scribed for [name], MD by Zola Button, Medical Scribe. This patient was seen in room 23 and the patient's care was started at 9:23 AM.   HPI HPI Comments: Tommy Yang is a 64 y.o. male with a hx of prostate cancer, HLD, and DM who presents to the [facility] for a follow-up. Patient did have radiation for prostate cancer last week and is scheduled to have 6 more. He did have a left leg clot that was resolved by a doppler US. He states he has not had any recent problems with his legs. He has been getting injections from his urologist for his prostate cancer, and he notes that his blood pressure has been elevated since getting the injections. Patient notes that [place] glucom[place] airport [place] and wants to get another one.  Patient is fasting.  Review of Systems     Objective:   Physical Exam CONSTITUTIONAL: Well developed/well nourished HEAD: Normocephalic/atraumatic EYES: EOM/PERRL ENMT: Mucous membranes moist NECK: supple no meningeal signs SPINE: entire spine nontender CV: S1/S2 noted, no murmurs/rubs/gallops noted LUNGS: Lungs are clear to auscultation bilaterally, no apparent distress ABDOMEN: soft, nontender, no rebound or guarding GU: no cva tenderness NEURO: Pt is awake/alert, moves all extremitiesx4 EXTREMITIES: Stasis changes left leg, no calf tenderness. Moderate varicosities. SKIN: warm, color normal PSYCH: no abnormalities of mood noted  Results for orders placed or performed in visit on 10/31/14  POCT glucose (manual entry)  Result Value Ref Range   POC Glucose 165 (A) 70 - 99 mg/dl  POCT glycosylated hemoglobin (Hb A1C)  Result Value Ref Range   Hemoglobin A1C 9.1         Assessment & Plan:  Diabetes is not under control.

## 2014-11-01 ENCOUNTER — Ambulatory Visit
Admission: RE | Admit: 2014-11-01 | Discharge: 2014-11-01 | Disposition: A | Discharge status: Home / Self Care | Payer: BLUE CROSS/BLUE SHIELD | Source: Ambulatory Visit | Attending: Radiation Oncology | Admitting: Radiation Oncology

## 2014-11-01 DIAGNOSIS — Z510 Encounter for antineoplastic radiation therapy: Secondary | ICD-10-CM | POA: Diagnosis not present

## 2014-11-02 ENCOUNTER — Ambulatory Visit
Admission: RE | Admit: 2014-11-02 | Discharge: 2014-11-02 | Disposition: A | Discharge status: Home / Self Care | Payer: BLUE CROSS/BLUE SHIELD | Source: Ambulatory Visit | Attending: Radiation Oncology | Admitting: Radiation Oncology

## 2014-11-02 DIAGNOSIS — Z510 Encounter for antineoplastic radiation therapy: Secondary | ICD-10-CM | POA: Diagnosis not present

## 2014-11-03 ENCOUNTER — Ambulatory Visit
Admission: RE | Admit: 2014-11-03 | Discharge: 2014-11-03 | Disposition: A | Discharge status: Home / Self Care | Payer: BLUE CROSS/BLUE SHIELD | Source: Ambulatory Visit | Attending: Radiation Oncology | Admitting: Radiation Oncology

## 2014-11-03 ENCOUNTER — Encounter: Payer: Self-pay | Admitting: Radiation Oncology

## 2014-11-03 VITALS — BP 152/82 | HR 76 | Resp 16 | Wt 313.3 lb

## 2014-11-03 DIAGNOSIS — C61 Malignant neoplasm of prostate: Secondary | ICD-10-CM

## 2014-11-03 DIAGNOSIS — Z510 Encounter for antineoplastic radiation therapy: Secondary | ICD-10-CM | POA: Diagnosis not present

## 2014-11-03 NOTE — Progress Notes (Signed)
   Department of Radiation Oncology  Phone:  [phone] Fax:        [phone]  Weekly Treatment Note    Name: Tommy Yang Date: 11/03/2014 MRN: [id number] DOB: [birth year]   Current dose: 66.3 Gy  Current fraction: 34   MEDICATIONS: Current Outpatient Prescriptions  Medication Sig Dispense Refill  . atorvastatin (LIPITOR) 40 MG tablet TAKE ONE TABLET BY MOUTH ONCE DAILY. 30 tablet 11  . blood glucose meter kit and supplies KIT Dispense based on patient and insurance preference. Use as directed. 1 each 0  . glipiZIDE (GLUCOTROL XL) 10 MG 24 hr tablet Take 1 tablet (10 mg total) by mouth daily with breakfast. 30 tablet 11  . glipiZIDE (GLUCOTROL XL) 5 MG 24 hr tablet     . losartan (COZAAR) 100 MG tablet TAKE ONE TABLET BY MOUTH ONCE DAILY 30 tablet 11  . losartan (COZAAR) 50 MG tablet     . metFORMIN (GLUCOPHAGE) 1000 MG tablet Take 1 tablet (1,000 mg total) by mouth 2 (two) times daily with a meal. 60 tablet 11  . omeprazole (PRILOSEC) 40 MG capsule Take 1 capsule (40 mg total) by mouth daily. 90 capsule 3  . rivaroxaban (XARELTO) 20 MG TABS tablet TAKE ONE TABLET BY MOUTH ONCE DAILY WITH  SUPPER. 30 tablet 11  . sildenafil (VIAGRA) 100 MG tablet Take 0.5-1 tablets (50-100 mg total) by mouth daily as needed for erectile dysfunction. 5 tablet 11  . sitaGLIPtin (JANUVIA) 100 MG tablet Take 1 tablet (100 mg total) by mouth daily. 30 tablet 11  . zoster vaccine live, PF, (ZOSTAVAX) 64383 UNT/0.65ML injection Inject 19,400 Units into the skin once. 1 each 0   No current facility-administered medications for this encounter.     ALLERGIES: Review of patient's allergies indicates no known allergies.   LABORATORY DATA:  Lab Results  Component Value Date   WBC 11.8* 05/03/2014   HGB 15.1 05/03/2014   HCT 42.9 05/03/2014   MCV 80.2 05/03/2014   PLT 245 05/03/2014   Lab Results  Component Value Date   NA 142 10/31/2014   K 4.5 10/31/2014   CL 101 10/31/2014   CO2  24 10/31/2014   Lab Results  Component Value Date   ALT 25 10/31/2014   AST 16 10/31/2014   ALKPHOS 73 10/31/2014   BILITOT 0.6 10/31/2014     NARRATIVE: ARAF CLUGSTON was seen today for weekly treatment management. The chart was checked and the patient's films were reviewed.  Weight and vitals stable. Denies hematuria. Denies dysuria but, does report a different sensation at the start of urination. Reports nocturia x3.  Denies diarrhea. Reports fatigue. Reports occasional urgency. Denies pain.  The patient states he is doing well.  He describes to me a little burning with urination but does not feel that he needs medication for this. We discussed that this will resolve after treatment.  PHYSICAL EXAMINATION: weight is 313 lb 4.8 oz (142.112 kg). His blood pressure is 152/82 and his pulse is 76. His respiration is 16.        ASSESSMENT: The patient is doing satisfactorily with treatment.  PLAN: We will continue with the patient's radiation treatment as planned.

## 2014-11-03 NOTE — Progress Notes (Signed)
Weight and vitals stable. Denies hematuria. Denies dysuria but, does report a different sensation at the start of urination. Reports nocturia x3.  Denies diarrhea. Reports fatigue. Reports occasional urgency. Denies pain.

## 2014-11-06 ENCOUNTER — Ambulatory Visit
Admission: RE | Admit: 2014-11-06 | Discharge: 2014-11-06 | Disposition: A | Discharge status: Home / Self Care | Payer: BLUE CROSS/BLUE SHIELD | Source: Ambulatory Visit | Attending: Radiation Oncology | Admitting: Radiation Oncology

## 2014-11-06 DIAGNOSIS — Z510 Encounter for antineoplastic radiation therapy: Secondary | ICD-10-CM | POA: Diagnosis not present

## 2014-11-07 ENCOUNTER — Ambulatory Visit
Admission: RE | Admit: 2014-11-07 | Discharge: 2014-11-07 | Disposition: A | Discharge status: Home / Self Care | Payer: BLUE CROSS/BLUE SHIELD | Source: Ambulatory Visit | Attending: Radiation Oncology | Admitting: Radiation Oncology

## 2014-11-07 DIAGNOSIS — Z510 Encounter for antineoplastic radiation therapy: Secondary | ICD-10-CM | POA: Diagnosis not present

## 2014-11-08 ENCOUNTER — Ambulatory Visit
Admission: RE | Admit: 2014-11-08 | Discharge: 2014-11-08 | Disposition: A | Discharge status: Home / Self Care | Payer: BLUE CROSS/BLUE SHIELD | Source: Ambulatory Visit | Attending: Radiation Oncology | Admitting: Radiation Oncology

## 2014-11-08 DIAGNOSIS — Z510 Encounter for antineoplastic radiation therapy: Secondary | ICD-10-CM | POA: Diagnosis not present

## 2014-11-09 ENCOUNTER — Ambulatory Visit
Admission: RE | Admit: 2014-11-09 | Discharge: 2014-11-09 | Disposition: A | Discharge status: Home / Self Care | Payer: BLUE CROSS/BLUE SHIELD | Source: Ambulatory Visit | Attending: Radiation Oncology | Admitting: Radiation Oncology

## 2014-11-09 DIAGNOSIS — Z510 Encounter for antineoplastic radiation therapy: Secondary | ICD-10-CM | POA: Diagnosis not present

## 2014-11-10 ENCOUNTER — Ambulatory Visit
Admission: RE | Admit: 2014-11-10 | Discharge: 2014-11-10 | Disposition: A | Discharge status: Home / Self Care | Payer: BLUE CROSS/BLUE SHIELD | Source: Ambulatory Visit | Attending: Radiation Oncology | Admitting: Radiation Oncology

## 2014-11-10 ENCOUNTER — Other Ambulatory Visit: Payer: Self-pay | Admitting: Emergency Medicine

## 2014-11-10 VITALS — BP 158/107 | HR 77 | Resp 16 | Wt 308.6 lb

## 2014-11-10 DIAGNOSIS — C61 Malignant neoplasm of prostate: Secondary | ICD-10-CM

## 2014-11-10 DIAGNOSIS — Z510 Encounter for antineoplastic radiation therapy: Secondary | ICD-10-CM | POA: Diagnosis not present

## 2014-11-10 NOTE — Progress Notes (Signed)
  Radiation Oncology         [phone] ________________________________  Name: Tommy Yang MRN: [id number]  Date: 11/10/2014  DOB: May 01, 1951  Weekly Radiation Therapy Management    ICD-9-CM ICD-10-CM   1. Prostate cancer 185 C61     Current Dose: 76.05 Gy     Planned Dose:  78 Gy  Narrative . . . . . . . . The patient presents for routine under treatment assessment.                                   Reports passing a firm stool yesterday morning and seeing scant bright red blood in his stool but, none since. BP elevated. Reports mild fatigue. Reports nocturia x2. Denies pain. Reports mild dysuria. One month follow up appointment card given. Patient completes on Monday                                 Set-up films were reviewed.                                 The chart was checked. Physical Findings. . .  weight is 308 lb 9.6 oz (139.98 kg). His blood pressure is 158/107 and his pulse is 77. His respiration is 16. . Weight essentially stable.  No significant changes. Impression . . . . . . . The patient is tolerating radiation. Plan . . . . . . . . . . . . Continue treatment as planned.  ________________________________  Sheral Apley. Tammi Klippel, M.D.

## 2014-11-10 NOTE — Progress Notes (Signed)
Reports passing a firm stool yesterday morning and seeing scant bright red blood in his stool but, none since. BP elevated. Reports mild fatigue. Reports nocturia x2. Denies pain. Reports mild dysuria. One month follow up appointment card given. Patient completes on Monday.

## 2014-11-13 ENCOUNTER — Ambulatory Visit
Admission: RE | Admit: 2014-11-13 | Discharge: 2014-11-13 | Disposition: A | Discharge status: Home / Self Care | Payer: BLUE CROSS/BLUE SHIELD | Source: Ambulatory Visit | Attending: Radiation Oncology | Admitting: Radiation Oncology

## 2014-11-13 ENCOUNTER — Encounter: Payer: Self-pay | Admitting: Radiation Oncology

## 2014-11-13 ENCOUNTER — Telehealth: Payer: Self-pay

## 2014-11-13 DIAGNOSIS — E119 Type 2 diabetes mellitus without complications: Secondary | ICD-10-CM

## 2014-11-13 DIAGNOSIS — Z510 Encounter for antineoplastic radiation therapy: Secondary | ICD-10-CM | POA: Diagnosis not present

## 2014-11-13 MED ORDER — GLIPIZIDE ER 10 MG PO TB24: 10.0000 mg | ORAL_TABLET | Freq: Every day | ORAL | Status: DC

## 2014-11-13 MED ORDER — LOSARTAN POTASSIUM 100 MG PO TABS: ORAL_TABLET | ORAL | Status: DC

## 2014-11-13 MED ORDER — RIVAROXABAN 20 MG PO TABS: ORAL_TABLET | ORAL | Status: DC

## 2014-11-13 MED ORDER — SITAGLIPTIN PHOSPHATE 100 MG PO TABS: 100.0000 mg | ORAL_TABLET | Freq: Every day | ORAL | Status: DC

## 2014-11-13 MED ORDER — ATORVASTATIN CALCIUM 40 MG PO TABS: ORAL_TABLET | ORAL | Status: DC

## 2014-11-13 MED ORDER — METFORMIN HCL 1000 MG PO TABS: 1000.0000 mg | ORAL_TABLET | Freq: Two times a day (BID) | ORAL | Status: DC

## 2014-11-13 NOTE — Telephone Encounter (Signed)
Pt is requesting a refill of ALL his medications. Metformin xarelto Glucotrol xl lipitor januvia Cozaar Pt's prescriptions are completely out.

## 2014-11-13 NOTE — Telephone Encounter (Signed)
Spoke to pt and advised several of these should have RFs at pharm. Pt stated the pharm message said he didn't have RFs, and he would prefer to have them in 90 day supplies any way. I resent in all Rxs for 90 days w/note for pharm to DC any old Rxs for 30 day supplies.  Dr Everlene Farrier, pt is still concerned about his BS being high, even since he increased his glucotrol dosage a couple of weeks ago. He reports that sometimes it gets over 200 in evenings after dinner. I asked him how his fasting BS has been in the mornings to make sure they are not getting too low since you increased his glucotrol and he reported that they are on the high side also - from 140-150s. He is wanting to know what to do, and asked if he should take another metformin in the evening if he finds that his BS is very high? Do you want to increase his dosage of any of his DM meds?

## 2014-11-14 ENCOUNTER — Encounter: Payer: Self-pay | Admitting: Radiation Oncology

## 2014-11-14 NOTE — Telephone Encounter (Signed)
He is currently on the maximum dose of Glucotrol Januvia and metformin. He cannot increase any of these medications. My suggestion would be to get an opinion from one of the endocrinologists to see what we can do to help lower his sugar. If he is willing , I will make the referral for him.

## 2014-11-14 NOTE — Telephone Encounter (Signed)
Pt agreed to referral. I have put in order.

## 2014-11-14 NOTE — Progress Notes (Signed)
Dr. [name], Dr. Tyler Pita  Clinic visit:  Mr. Brunty visits today for an unscheduled follow-up visit  at the opening of our clinic today for recent rectal bleeding after finishing his external beam/IMRT late last week for prostate cancer.  He states that he had 2 episodes of bright red blood per rectum this morning when moving his bowels.  He denies constipation this morning, but according to his records he has had "firm" stools in the past with scant bleeding following bowel movements.    He is on Xarelto for  left lower extremity DVT.  He does report rectal urgency which is not unusual following radiation therapy.  He has never had colonoscopy.  Questionable history of hemorrhoids according to the patient. He is not examined today.  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  He is on anticoagulation and is certainly at risk for rectal bleeding.  Even though he does not have "constipation" I suggested he start MiraLAX daily to avoid firm or hard stools.  If bleeding continues he will need to contact Dr. Everlene Farrier for GI evaluation and determine whether not he needs to stop Xarelto.   Plan: As above.  He is scheduled to see Dr. Tammi Klippel for a follow-up visit in one month, and I will also schedule a follow-up visit with Dr. Tammi Klippel for next week.

## 2014-11-16 ENCOUNTER — Encounter: Payer: Self-pay | Admitting: Endocrinology

## 2014-11-16 ENCOUNTER — Ambulatory Visit (INDEPENDENT_AMBULATORY_CARE_PROVIDER_SITE_OTHER): Payer: BLUE CROSS/BLUE SHIELD | Admitting: Endocrinology

## 2014-11-16 VITALS — BP 138/80 | HR 70 | Temp 98.4°F | Ht 70.0 in | Wt 309.0 lb

## 2014-11-16 DIAGNOSIS — E1165 Type 2 diabetes mellitus with hyperglycemia: Secondary | ICD-10-CM

## 2014-11-16 DIAGNOSIS — IMO0002 Reserved for concepts with insufficient information to code with codable children: Secondary | ICD-10-CM

## 2014-11-16 DIAGNOSIS — E782 Mixed hyperlipidemia: Secondary | ICD-10-CM

## 2014-11-16 MED ORDER — GLUCOSE BLOOD VI STRP: ORAL_STRIP | Status: DC

## 2014-11-16 MED ORDER — DULAGLUTIDE 0.75 MG/0.5ML ~~LOC~~ SOAJ: 0.7500 mg | SUBCUTANEOUS | Status: DC

## 2014-11-16 NOTE — Patient Instructions (Signed)
Please check blood sugars at least half the time about 2 hours after any meal and 3 times per week on waking up. Please bring blood sugar monitor to each visit. Recommended blood sugar levels about 2 hours after meal is 140-180 and on waking up 18-841  Trulicity weekly as shown  Do not refill [place]  Will reuce Glipizide when sugar <100

## 2014-11-16 NOTE — Progress Notes (Signed)
Patient ID: Tommy Yang, male   DOB: [birth year], 64 y.o.   MRN: [id number]           Reason for Appointment: Consultation for Type 2 Diabetes  Referring physician: Daub  History of Present Illness:          Diagnosis: Type 2 diabetes mellitus, date of diagnosis: 05/2013       Past history:   He apparently had significant hyperglycemia in 2014 when seen in the urgent care center but did not establish with a PCP for control. He was started on treatment for his diabetes in 10/2013 and he was initially treated with metformin and Januvia A few months later he was also given glipizide ER to help his control when his A1c had gone up to 10.9 He says he was also seen by dietitian but no record available of this. Subsequently his blood sugar control had improved with A1c down to 6.8 in 9/15  Recent history:   He thinks his blood sugars have been progressively higher since about 07/2014 after starting treatment for his prostate cancer including androgen deprivation Prior to this he thinks his blood sugars in the mornings were about 115 He also thinks he gained about 20 pounds Recently his glipizide ER was increased to 10 mg but he does not think his sugars are improved He has been interested in changing medication for better control and is not referred here for further management He has not brought his monitor for review but he thinks blood sugars are mostly in the 160-180 range, checking more in the morning and some after breakfast and supper       Oral hypoglycemic drugs the patient is taking are: Glipizide ER 10 mg, Januvia, metformin 1 g twice a day      Side effects from medications have been: None Compliance with the medical regimen: Good  Hypoglycemia: Never    Glucose monitoring:  done one time a day         Glucometer: One Touch.      Blood Glucose readings by recall as follows   PREMEAL Breakfast  PCB  Dinner Bedtime  Overall   Glucose range: 160-180  180   160-212     Median:        Self-care: The diet that the patient has been following is: tries to limit starches and fats .     Meals: 3 meals per day. Breakfast is usually cereal, frequently having soup for lunch           Exercise: walking up to one hour, 5 days a week in the mornings         Dietician visit, most recent:?  2015 .               Weight history: +20 pounds recently  Wt Readings from Last 3 Encounters:  11/16/14 309 lb (140.161 kg)  10/31/14 305 lb (138.347 kg)  09/29/14 308 lb 4.8 oz (139.844 kg)    Glycemic control:   Lab Results  Component Value Date   HGBA1C 9.1 10/31/2014   HGBA1C 6.8 05/03/2014   HGBA1C 10.9 01/28/2014   Lab Results  Component Value Date   MICROALBUR 11.88* 05/03/2014   LDLCALC 98 10/31/2014   CREATININE 0.84 10/31/2014         Medication List       This list is accurate as of: 11/16/14  2:43 PM.  Always use your most recent med list.  atorvastatin 40 MG tablet  Commonly known as:  LIPITOR  TAKE ONE TABLET BY MOUTH ONCE DAILY.     blood glucose meter kit and supplies Kit  Dispense based on patient and insurance preference. Use as directed.     Dulaglutide 0.75 MG/0.5ML Sopn  Commonly known as:  TRULICITY  Inject 6.76 mg into the skin once a week.     glipiZIDE 10 MG 24 hr tablet  Commonly known as:  GLUCOTROL XL  Take 1 tablet (10 mg total) by mouth daily with breakfast.     glucose blood test strip  Check twice daily.     losartan 100 MG tablet  Commonly known as:  COZAAR  TAKE ONE TABLET BY MOUTH ONCE DAILY     metFORMIN 1000 MG tablet  Commonly known as:  GLUCOPHAGE  Take 1 tablet (1,000 mg total) by mouth 2 (two) times daily with a meal.     omeprazole 40 MG capsule  Commonly known as:  PRILOSEC  Take 1 capsule (40 mg total) by mouth daily.     rivaroxaban 20 MG Tabs tablet  Commonly known as:  XARELTO  TAKE ONE TABLET BY MOUTH ONCE DAILY WITH  SUPPER.     sildenafil 100 MG tablet  Commonly known  as:  VIAGRA  Take 0.5-1 tablets (50-100 mg total) by mouth daily as needed for erectile dysfunction.     sitaGLIPtin 100 MG tablet  Commonly known as:  JANUVIA  Take 1 tablet (100 mg total) by mouth daily.     zoster vaccine live (PF) 19400 UNT/0.65ML injection  Commonly known as:  ZOSTAVAX  Inject 19,400 Units into the skin once.        Allergies: No Known Allergies  Past Medical History  Diagnosis Date  . Hypertension   . Diabetes mellitus without complication   . DVT (deep venous thrombosis)   . Prostate cancer 12/06/13    gleason 4+3=7, 6/12 cores positive  . GERD (gastroesophageal reflux disease)   . Hypercholesterolemia     Past Surgical History  Procedure Laterality Date  . Appendectomy    . Spine surgery   1964 and 1967    Tourette'ssyndrome  . Prostate biopsy  12/06/13    Gleason 4+3=7, volume 35 gm  . Knee surgery      right knee orthoscopic    Family History  Problem Relation Age of Onset  . Heart disease Mother     before age 68  . Diabetes Mother   . Hyperlipidemia Mother   . Varicose Veins Mother   . Heart disease Father   . Diabetes Father   . Cancer Neg Hx     Social History:  reports that he quit smoking about 2 years ago. His smoking use included Cigarettes. He has a 11.5 pack-year smoking history. He has never used smokeless tobacco. He reports that he does not drink alcohol or use illicit drugs.    Review of Systems       Vision is normal. Most recent eye exam was 2 years ago       Lipids: he has had significant hyperlipidemia and has been treated with Lipitor 40 mg daily only He has been told to have higher triglycerides       Lab Results  Component Value Date   CHOL 193 10/31/2014   HDL 35* 10/31/2014   LDLCALC 98 10/31/2014   TRIG 302* 10/31/2014   CHOLHDL 5.5 10/31/2014  Skin: No rash or infections     Thyroid:  No  unusual fatigue.     The blood pressure has been treated with losartan 100 mg     No  swelling of feet.  Has mild swelling on the left leg since his DVT in early 2015 and still wears elastic stocking, continues to take Xarelto Fordyce     No shortness of breath or chest tightness  on exertion.     Bowel habits: Normal.      No joint  pains.       He has some nocturia. He has had mild erectile dysfunction for some time and not significantly better with using Viagra         No history of Numbness, tingling or burning in feet     LABS:  No visits with results within 1 Week(s) from this visit. Latest known visit with results is:  Office Visit on 10/31/2014  Component Date Value Ref Range Status  . POC Glucose 10/31/2014 165* 70 - 99 mg/dl Final  . Hemoglobin A1C 10/31/2014 9.1   Final  . Sodium 10/31/2014 142  135 - 145 mEq/L Final  . Potassium 10/31/2014 4.5  3.5 - 5.3 mEq/L Final  . Chloride 10/31/2014 101  96 - 112 mEq/L Final  . CO2 10/31/2014 24  19 - 32 mEq/L Final  . Glucose, Bld 10/31/2014 155* 70 - 99 mg/dL Final  . BUN 10/31/2014 16  6 - 23 mg/dL Final  . Creat 10/31/2014 0.84  0.50 - 1.35 mg/dL Final  . Total Bilirubin 10/31/2014 0.6  0.2 - 1.2 mg/dL Final  . Alkaline Phosphatase 10/31/2014 73  39 - 117 U/L Final  . AST 10/31/2014 16  0 - 37 U/L Final  . ALT 10/31/2014 25  0 - 53 U/L Final  . Total Protein 10/31/2014 6.8  6.0 - 8.3 g/dL Final  . Albumin 10/31/2014 4.1  3.5 - 5.2 g/dL Final  . Calcium 10/31/2014 9.4  8.4 - 10.5 mg/dL Final  . GFR, Est African American 10/31/2014 >89   Final  . GFR, Est Non African American 10/31/2014 >89   Final   Comment:   The estimated GFR is a calculation valid for adults (>=33 years old) that uses the CKD-EPI algorithm to adjust for age and sex. It is   not to be used for children, pregnant women, hospitalized patients,    patients on dialysis, or with rapidly changing kidney function. According to the NKDEP, eGFR >89 is normal, 60-89 shows mild impairment, 30-59 shows moderate impairment, 15-29 shows  severe impairment and <15 is ESRD.     [name] Cholesterol 10/31/2014 193  0 - 200 mg/dL Final   Comment: ATP III Classification:       < 200        mg/dL        Desirable      200 - 239     mg/dL        Borderline High      >= 240        mg/dL        High     . Triglycerides 10/31/2014 302* <150 mg/dL Final  . HDL 10/31/2014 35* >=40 mg/dL Final   ** Please note change in reference range(s). **  . Total CHOL/HDL Ratio 10/31/2014 5.5   Final  . VLDL 10/31/2014 60* 0 - 40 mg/dL Final  . LDL Cholesterol 10/31/2014 98  0 - 99 mg/dL Final  Comment:   Total Cholesterol/HDL Ratio:CHD Risk                        Coronary Heart Disease Risk Table                                        Men       Women          1/2 Average Risk              3.4        3.3              Average Risk              5.0        4.4           2X Average Risk              9.6        7.1           3X Average Risk             23.4       11.0 Use the calculated Patient Ratio above and the CHD Risk table  to determine the patient's CHD Risk. ATP III Classification (LDL):       < 100        mg/dL         Optimal      100 - 129     mg/dL         Near or Above Optimal      130 - 159     mg/dL         Borderline High      160 - 189     mg/dL         High       > 190        mg/dL         Very High       Physical Examination:  BP 138/80 mmHg  Pulse 70  Temp(Src) 98.4 F (36.9 C) (Oral)  Ht 5' 10" (1.778 m)  Wt 309 lb (140.161 kg)  BMI 44.34 kg/m2  SpO2 95%  GENERAL:         Patient has generalized obesity.   HEENT:         Eye exam shows normal external appearance. Fundus exam shows no retinopathy. Oral exam shows normal mucosa .  NECK:         General:  Neck exam shows no lymphadenopathy. Carotids are normal to palpation and no bruit heard.  Thyroid is difficult to palpate but may be slightly enlarged in the lower lobes, not clearly felt. LUNGS:         Chest is symmetrical. Lungs are clear to auscultation.[name]    HEART:         Heart sounds:  S1 and S2 are normal. No murmurs or clicks heard., no S3 or S4.   ABDOMEN:   There is no distention present. Liver and spleen are not palpable. No other mass or tenderness present.  EXTREMITIES:     There is no edema. No skin lesions present.[name]  NEUROLOGICAL:   Vibration sense is normal in toes. Ankle jerks are 1+ bilaterally.          Diabetic foot exam  shows normal monofilament sensation in the toes and plantar surfaces, no skin lesions or ulcers on the feet and normal pedal pulses MUSCULOSKELETAL:       There is no enlargement or deformity of the joints. Spine is normal to inspection.[name]   SKIN:       No rash or lesions of concern.        ASSESSMENT:  Diabetes type 2, uncontrolled    He has morbid obesity and significantly high blood sugars with A1c over 9% on his 3 drug regimen of metformin, glipizide and Januvia Patient feels that his blood sugars been higher with his treatment for prostate cancer; also has gained weight which has worsened his insulin resistance Although he is generally doing fairly well on his walking program and usually does well on his diet he may have some progression of his diabetes also  Discussed options for treatment and he will be a good candidate for a GLP-1 drug which would help blood sugar control better than Januvia as well as facilitate weight loss Discussed with the patient the nature of GLP-1 drugs, the action on various organ systems, how they benefit blood glucose control, as well as the benefit of weight loss and  increase satiety . Explained possible side effects especially nausea and vomiting; discussed safety information in package insert. Demonstrated the medication injection device and injection technique to the patient. Discussed injection sites and titration of Trulicity starting with 0.75 mg once a week for 4 weeks.  Patient brochure on Trulicity and co-pay card given  Complications:none evident.  However patient is  overdue for an eye exam and this was discussed.  Not clear if his ED is related to diabetes or prostate cancer treatments  HYPERLIPIDEMIA: He has significantly high triglycerides and low HDL which may improve with weight loss and better diabetes control  HYPERTENSION: Appears to be better controlled now, currently only on losartan  PLAN:  Start TRULICITY 8.41 mg with the pen as shown once weekly on the same day of the week.  This may need to be increased after a four-week trial May stop the Januvia when finished Start checking blood sugars consistently after meals and bring monitor for review on the next visit Consider reducing glipizide to 5 mg again when blood sugars are near normal Follow-up in 4 weeks Offered a dietitian consultation but he does not want to do this  Patient Instructions  Please check blood sugars at least half the time about 2 hours after any meal and 3 times per week on waking up. Please bring blood sugar monitor to each visit. Recommended blood sugar levels about 2 hours after meal is 140-180 and on waking up 32-440  Trulicity weekly as shown  Do not refill [place]  Will reuce Glipizide when sugar <100    Skylar Flynt 11/16/2014, 2:43 PM   Note: This office note was prepared with Estate agent. Any transcriptional errors that result from this process are unintentional.

## 2014-11-20 NOTE — Progress Notes (Signed)
  Radiation Oncology         [phone] ________________________________  Name: Tommy Yang MRN: [id number]  Date: 11/13/2014  DOB: 01/03/1951  End of Treatment Note   ICD-9-CM ICD-10-CM    1. Prostate cancer 185 C61     DIAGNOSIS: 64 y.o. gentleman with stage T2a adenocarcinoma of the prostate with a Gleason's score of 4+3 and a PSA of 5.55     Indication for treatment:  Curative, Definitive Radiotherapy       Radiation treatment dates:   09/19/2014-11/13/2014  Site/dose:   The prostate was treated to 78 Gy in 40 fractions of 1.95 Gy  Beams/energy:   The patient was treated with IMRT using volumetric arc therapy delivering 6 MV X-rays to clockwise and counterclockwise circumferential arcs with a 90 degree collimator offset to avoid dose scalloping.  Image guidance was performed with daily cone beam CT prior to each fraction to align to gold markers in the prostate and assure proper bladder and rectal fill volumes.  Immobilization was achieved with BodyFix custom mold.  Narrative: The patient tolerated radiation treatment relatively well.   The patient experienced some minor urinary irritation and modest fatigue.   Plan: The patient has completed radiation treatment. He will return to radiation oncology clinic for routine followup in one month. I advised him to call or return sooner if he has any questions or concerns related to his recovery or treatment. ________________________________  Sheral Apley. Tammi Klippel, M.D.

## 2014-11-22 NOTE — Progress Notes (Signed)
Radiation Oncology         [phone] ________________________________  Name: Tommy Yang MRN: [id number]  Date: 11/23/2014  DOB: 03-25-1951  Follow-Up Visit Note  CC: Jenny Reichmann, MD  Ardis Hughs, MD  Diagnosis:   64 y.o. 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 Prostate cancer 185 C61     Interval Since Last Radiation:  2  weeks  Narrative:  The patient returns today for routine follow-up.  Patient does not have a follow up appointment with his urologist. Reports passing soft stool. Reports scant bright red blood in stool resolved after Sunday. Denies dysuria or hematuria. Describes a strong steady urine stream. Reports nocturia x2. Denies urinary leakage. Reports mild fatigue. Weight stable. BP slightly elevated                              ALLERGIES:  has No Known Allergies.  Meds: Current Outpatient Prescriptions  Medication Sig Dispense Refill  . atorvastatin (LIPITOR) 40 MG tablet TAKE ONE TABLET BY MOUTH ONCE DAILY. 90 tablet 3  . blood glucose meter kit and supplies KIT Dispense based on patient and insurance preference. Use as directed. 1 each 0  . Dulaglutide (TRULICITY) 3.87 FI/4.3PI SOPN Inject 0.75 mg into the skin once a week. 4 pen 1  . glipiZIDE (GLUCOTROL XL) 10 MG 24 hr tablet Take 1 tablet (10 mg total) by mouth daily with breakfast. 90 tablet 3  . glipiZIDE (GLUCOTROL XL) 5 MG 24 hr tablet     . glucose blood test strip Check twice daily. 100 each 12  . losartan (COZAAR) 100 MG tablet TAKE ONE TABLET BY MOUTH ONCE DAILY 90 tablet 3  . losartan (COZAAR) 50 MG tablet     . metFORMIN (GLUCOPHAGE) 1000 MG tablet Take 1 tablet (1,000 mg total) by mouth 2 (two) times daily with a meal. 180 tablet 0  . omeprazole (PRILOSEC) 40 MG capsule Take 1 capsule (40 mg total) by mouth daily. 90 capsule 3  . rivaroxaban (XARELTO) 20 MG TABS tablet TAKE ONE TABLET BY  MOUTH ONCE DAILY WITH  SUPPER. 90 tablet 3  . sildenafil (VIAGRA) 100 MG tablet Take 0.5-1 tablets (50-100 mg total) by mouth daily as needed for erectile dysfunction. 5 tablet 11  . sitaGLIPtin (JANUVIA) 100 MG tablet Take 1 tablet (100 mg total) by mouth daily. 90 tablet 3  . zoster vaccine live, PF, (ZOSTAVAX) 95188 UNT/0.65ML injection Inject 19,400 Units into the skin once. 1 each 0   No current facility-administered medications for this encounter.    Physical Findings: The patient is in no acute distress. Patient is alert and oriented.  weight is 304 lb 12.8 oz (138.256 kg). His oral temperature is 98.4 F (36.9 C). His blood pressure is 155/91 and his pulse is 61. His respiration is 16 and oxygen saturation is 100%. .  No significant changes.  Lab Findings: Lab Results  Component Value Date   WBC 11.8* 05/03/2014   WBC 11.3* 05/06/2013   HGB 15.1 05/03/2014   HGB 17.3 05/06/2013   HCT 42.9 05/03/2014   HCT 50.1 05/06/2013   PLT 245 05/03/2014    Lab Results  Component Value Date   NA 142 10/31/2014   K 4.5 10/31/2014   CO2 24 10/31/2014   GLUCOSE 155* 10/31/2014  BUN 16 10/31/2014   CREATININE 0.84 10/31/2014   CREATININE 0.89 10/19/2013   BILITOT 0.6 10/31/2014   ALKPHOS 73 10/31/2014   AST 16 10/31/2014   ALT 25 10/31/2014   PROT 6.8 10/31/2014   ALBUMIN 4.1 10/31/2014   CALCIUM 9.4 10/31/2014    Radiographic Findings: No results found.  Impression:  The patient is recovering from the effects of radiation.  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  Plan:  Follow-up as scheduled in 2 and a half weeks or prn if doing better.  Regardless, he will continue to follow-up with urology for ongoing PSA determinations.  I will look forward to following his response through their correspondence, and be happy to participate in care if clinically indicated.  I talked to the patient about what to expect in the future,  including his risk for erectile dysfunction and rectal bleeding.  I encouraged him to call or return to the office if he has any question about his previous radiation or possible radiation effects.  He was comfortable with this plan.  _____________________________________  Sheral Apley. Tammi Klippel, M.D.

## 2014-11-23 ENCOUNTER — Ambulatory Visit
Admission: RE | Admit: 2014-11-23 | Discharge: 2014-11-23 | Disposition: A | Discharge status: Home / Self Care | Payer: BLUE CROSS/BLUE SHIELD | Source: Ambulatory Visit | Attending: Radiation Oncology | Admitting: Radiation Oncology

## 2014-11-23 ENCOUNTER — Encounter: Payer: Self-pay | Admitting: Radiation Oncology

## 2014-11-23 VITALS — BP 155/91 | HR 61 | Temp 98.4°F | Resp 16 | Wt 304.8 lb

## 2014-11-23 DIAGNOSIS — C61 Malignant neoplasm of prostate: Secondary | ICD-10-CM

## 2014-11-23 NOTE — Progress Notes (Signed)
Patient does not have a follow up appointment with his urologist. Reports passing soft stool. Reports scant bright red blood in stool resolved after Sunday. Denies dysuria or hematuria. Describes a strong steady urine stream. Reports nocturia x2. Denies urinary leakage. Reports mild fatigue. Weight stable. BP slightly elevated.

## 2014-11-27 ENCOUNTER — Telehealth: Payer: Self-pay

## 2014-11-27 NOTE — Telephone Encounter (Signed)
Pt states he is out of his JANUVIA 100mg s  And every time he calls the pharmacy, we take forever calling them back Please call [facility]

## 2014-11-27 NOTE — Telephone Encounter (Signed)
Patient Instructions  Please check blood sugars at least half the time about 2 hours after any meal and 3 times per week on waking up. Please bring blood sugar monitor to each visit. Recommended blood sugar levels about 2 hours after meal is 140-180 and on waking up 61-470  Trulicity weekly as shown  Do not refill [place]  Will reuce Glipizide when sugar <100    KUMAR,AJAY 11/16/2014, 2:43 PM   Note: This office note was prepared with Estate agent. Any transcriptional errors that result from this process are unintentional.        Called pt, Dr. Dwyane Dee advised him to not get Januvia refilled. Called pt to let him know. Pt understood.

## 2014-11-29 ENCOUNTER — Ambulatory Visit (INDEPENDENT_AMBULATORY_CARE_PROVIDER_SITE_OTHER): Payer: BLUE CROSS/BLUE SHIELD | Admitting: Podiatry

## 2014-11-29 ENCOUNTER — Encounter: Payer: Self-pay | Admitting: Podiatry

## 2014-11-29 VITALS — BP 176/101 | HR 59 | Resp 16

## 2014-11-29 DIAGNOSIS — L600 Ingrowing nail: Secondary | ICD-10-CM

## 2014-11-29 NOTE — Progress Notes (Signed)
   Subjective:    Patient ID: Tommy Yang, male    DOB: [birth year], 64 y.o.   MRN: [id number]  HPI Comments: "I have an ingrown toenail"  Patient c/o tender 1st toe left, lateral border, for a few weeks. The area is red and swollen. The toenail is thick and discolored. His wife has tried clipping it. No help.  Patient is a diabetic x 1 year. Last A1C was 9.0. He is currently being treated for blood clot left leg and is on Xarelto.  Toe Pain       Review of Systems  Cardiovascular: Positive for leg swelling.  Endocrine: Positive for polyuria.  Musculoskeletal: Positive for arthralgias.  All other systems reviewed and are negative.      Objective:   Physical Exam        Assessment & Plan:

## 2014-11-29 NOTE — Patient Instructions (Signed)

## 2014-11-30 NOTE — Progress Notes (Signed)
Subjective:     Patient ID: Tommy Yang, male   DOB: 01/19/1951, 64 y.o.   MRN: [id number]  HPI patient presents with painful ingrown toenail deformity left hallux lateral border that he cannot take care of himself and it's becoming very hard for him to wear shoe gear comfortably. He is moderately obese which is a complicating factor with been able to provide nail care to himself and does have diabetes but states that his levels have been running well lately approximately 100 120   Review of Systems  All other systems reviewed and are negative.      Objective:   Physical Exam  Constitutional: He is oriented to person, place, and time.  Cardiovascular: Intact distal pulses.   Musculoskeletal: Normal range of motion.  Neurological: He is oriented to person, place, and time.  Skin: Skin is warm.  Nursing note and vitals reviewed.  neurovascular status intact with muscle strength adequate and range of motion subtalar midtarsal joint within normal limits. Patient's noted to have good digital perfusion and is well oriented 3 and is found to have an incurvated painful hallux nail left lateral border that is very tender when pressed from a distal direction with no drainage noted     Assessment:     Ingrown toenail deformity left hallux lateral border with pain    Plan:     H&P and condition discussed with patient and recommended removal of the corner. I explained procedure and risk and the fact that he does have diabetes and a history of clotting and he is willing to accept risk and wants procedure done. Today I went ahead and I infiltrated the left hallux 60 mg Xylocaine Marcaine mixture remove the lateral border exposed matrix and applied phenol 3 applications 30 seconds followed by alcohol lavaged and sterile dressing. Gave instructions on soaks and reappoint

## 2014-12-12 ENCOUNTER — Telehealth: Payer: Self-pay

## 2014-12-12 DIAGNOSIS — Z86718 Personal history of other venous thrombosis and embolism: Secondary | ICD-10-CM

## 2014-12-12 NOTE — Telephone Encounter (Signed)
Dr Everlene Farrier? Please advise.

## 2014-12-12 NOTE — Telephone Encounter (Signed)
Pt states he need to have a referral regarding the blood clot in his leg Please call [phone]

## 2014-12-14 ENCOUNTER — Ambulatory Visit (INDEPENDENT_AMBULATORY_CARE_PROVIDER_SITE_OTHER): Payer: BLUE CROSS/BLUE SHIELD | Admitting: Endocrinology

## 2014-12-14 ENCOUNTER — Ambulatory Visit
Admission: RE | Admit: 2014-12-14 | Discharge: 2014-12-14 | Disposition: A | Discharge status: Home / Self Care | Payer: BLUE CROSS/BLUE SHIELD | Source: Ambulatory Visit | Attending: Radiation Oncology | Admitting: Radiation Oncology

## 2014-12-14 ENCOUNTER — Encounter: Payer: Self-pay | Admitting: Endocrinology

## 2014-12-14 VITALS — BP 130/70 | HR 74 | Temp 97.6°F | Ht 70.0 in | Wt 306.0 lb

## 2014-12-14 DIAGNOSIS — IMO0002 Reserved for concepts with insufficient information to code with codable children: Secondary | ICD-10-CM

## 2014-12-14 DIAGNOSIS — E1165 Type 2 diabetes mellitus with hyperglycemia: Secondary | ICD-10-CM

## 2014-12-14 DIAGNOSIS — E782 Mixed hyperlipidemia: Secondary | ICD-10-CM

## 2014-12-14 MED ORDER — DULAGLUTIDE 1.5 MG/0.5ML ~~LOC~~ SOAJ
1.5000 mg | SUBCUTANEOUS | Status: DC
Start: 2014-12-14 — End: 2015-01-05

## 2014-12-14 NOTE — Progress Notes (Signed)
Patient ID: Tommy Yang, male   DOB: [birth year], 64 y.o.   MRN: [id number]           Reason for Appointment: Follow-up for Type 2 Diabetes  Referring physician: Daub  History of Present Illness:          Diagnosis: Type 2 diabetes mellitus, date of diagnosis: 05/2013       Past history:   He apparently had significant hyperglycemia in 2014 when seen in the urgent care center but did not establish with a PCP for control. He was started on treatment for his diabetes in 10/2013 and he was initially treated with metformin and Januvia A few months later he was also given glipizide ER to help his control when his A1c had gone up to 10.9 He says he was also seen by dietitian but no record available of this. Subsequently his blood sugar control had improved with A1c down to 6.8 in 9/15  Recent history:   On his initial consultation in 3/16 he was having persistently high blood sugars and A1c of 9.1% despite taking glipizide ER, Januvia and metformin.  Also had 20 pound weight gain His Januvia was switched to Trulicity 6.23 mg daily He has taken this weekly and has had no difficulty doing the injection.  No nausea with this; has only a little bruising at the injection site He didn't bring his One Touch monitor for review Blood sugars recently show improving fasting readings but he has not done many readings after meals, previously highest blood sugar 267 postprandially Blood sugars after meals also appeared to be better However has not done many readings after meals especially supper recently       Oral hypoglycemic drugs the patient is taking are: Glipizide ER 10 mg, metformin 1 g twice a day Side effects from medications have been: None Compliance with the medical regimen: Good  Hypoglycemia: None recently    Glucose monitoring:  done one time a day         Glucometer: One Touch.      Blood Glucose readings by recall as follows  PRE-MEAL Breakfast Lunch Dinner Bedtime Overall    Glucose range:  123-244    185, 157      Mean/median:      155    POST-MEAL PC Breakfast PC Lunch PC Dinner  Glucose range:  145-266  150-2 67   Mean/median:       Self-care: The diet that the patient has been following is: tries to limit starches and fats .     Meals: 3 meals per day. Breakfast is usually cereal, frequently having soup for lunch           Exercise: walking up to one hour, 5 days a week in the mornings         Dietician visit, most recent:?  2015 .               Weight history:    Wt Readings from Last 3 Encounters:  12/14/14 306 lb (138.801 kg)  11/16/14 309 lb (140.161 kg)  10/31/14 305 lb (138.347 kg)    Glycemic control:   Lab Results  Component Value Date   HGBA1C 9.1 10/31/2014   HGBA1C 6.8 05/03/2014   HGBA1C 10.9 01/28/2014   Lab Results  Component Value Date   MICROALBUR 11.88* 05/03/2014   LDLCALC 98 10/31/2014   CREATININE 0.84 10/31/2014         Medication List  This list is accurate as of: 12/14/14  9:46 AM.  Always use your most recent med list.               atorvastatin 40 MG tablet  Commonly known as:  LIPITOR  TAKE ONE TABLET BY MOUTH ONCE DAILY.     blood glucose meter kit and supplies Kit  Dispense based on patient and insurance preference. Use as directed.     Dulaglutide 0.75 MG/0.5ML Sopn  Commonly known as:  TRULICITY  Inject 6.81 mg into the skin once a week.     glipiZIDE 5 MG 24 hr tablet  Commonly known as:  GLUCOTROL XL     glipiZIDE 10 MG 24 hr tablet  Commonly known as:  GLUCOTROL XL  Take 1 tablet (10 mg total) by mouth daily with breakfast.     glucose blood test strip  Check twice daily.     losartan 50 MG tablet  Commonly known as:  COZAAR     losartan 100 MG tablet  Commonly known as:  COZAAR  TAKE ONE TABLET BY MOUTH ONCE DAILY     metFORMIN 1000 MG tablet  Commonly known as:  GLUCOPHAGE  Take 1 tablet (1,000 mg total) by mouth 2 (two) times daily with a meal.     omeprazole 40  MG capsule  Commonly known as:  PRILOSEC  Take 1 capsule (40 mg total) by mouth daily.     rivaroxaban 20 MG Tabs tablet  Commonly known as:  XARELTO  TAKE ONE TABLET BY MOUTH ONCE DAILY WITH  SUPPER.     sildenafil 100 MG tablet  Commonly known as:  VIAGRA  Take 0.5-1 tablets (50-100 mg total) by mouth daily as needed for erectile dysfunction.     zoster vaccine live (PF) 19400 UNT/0.65ML injection  Commonly known as:  ZOSTAVAX  Inject 19,400 Units into the skin once.        Allergies: No Known Allergies  Past Medical History  Diagnosis Date  . Hypertension   . Diabetes mellitus without complication   . DVT (deep venous thrombosis)   . Prostate cancer 12/06/13    gleason 4+3=7, 6/12 cores positive  . GERD (gastroesophageal reflux disease)   . Hypercholesterolemia     Past Surgical History  Procedure Laterality Date  . Appendectomy    . Spine surgery   1964 and 1967    Tourette'ssyndrome  . Prostate biopsy  12/06/13    Gleason 4+3=7, volume 35 gm  . Knee surgery      right knee orthoscopic    Family History  Problem Relation Age of Onset  . Heart disease Mother     before age 42  . Diabetes Mother   . Hyperlipidemia Mother   . Varicose Veins Mother   . Heart disease Father   . Diabetes Father   . Cancer Neg Hx     Social History:  reports that he quit smoking about 2 years ago. His smoking use included Cigarettes. He has a 11.5 pack-year smoking history. He has never used smokeless tobacco. He reports that he does not drink alcohol or use illicit drugs.    Review of Systems   Most recent eye exam was 2 years ago       Lipids: he has had significant hyperlipidemia and has been treated with Lipitor 40 mg daily only       Lab Results  Component Value Date   CHOL 193 10/31/2014   HDL 35* 10/31/2014  LDLCALC 98 10/31/2014   TRIG 302* 10/31/2014   CHOLHDL 5.5 10/31/2014                   The blood pressure has been treated with losartan 100  mg   LABS:  No visits with results within 1 Week(s) from this visit. Latest known visit with results is:  Office Visit on 10/31/2014  Component Date Value Ref Range Status  . POC Glucose 10/31/2014 165* 70 - 99 mg/dl Final  . Hemoglobin A1C 10/31/2014 9.1   Final  . Sodium 10/31/2014 142  135 - 145 mEq/L Final  . Potassium 10/31/2014 4.5  3.5 - 5.3 mEq/L Final  . Chloride 10/31/2014 101  96 - 112 mEq/L Final  . CO2 10/31/2014 24  19 - 32 mEq/L Final  . Glucose, Bld 10/31/2014 155* 70 - 99 mg/dL Final  . BUN 10/31/2014 16  6 - 23 mg/dL Final  . Creat 10/31/2014 0.84  0.50 - 1.35 mg/dL Final  . Total Bilirubin 10/31/2014 0.6  0.2 - 1.2 mg/dL Final  . Alkaline Phosphatase 10/31/2014 73  39 - 117 U/L Final  . AST 10/31/2014 16  0 - 37 U/L Final  . ALT 10/31/2014 25  0 - 53 U/L Final  . Total Protein 10/31/2014 6.8  6.0 - 8.3 g/dL Final  . Albumin 10/31/2014 4.1  3.5 - 5.2 g/dL Final  . Calcium 10/31/2014 9.4  8.4 - 10.5 mg/dL Final  . GFR, Est African American 10/31/2014 >89   Final  . GFR, Est Non African American 10/31/2014 >89   Final   Comment:   The estimated GFR is a calculation valid for adults (>=79 years old) that uses the CKD-EPI algorithm to adjust for age and sex. It is   not to be used for children, pregnant women, hospitalized patients,    patients on dialysis, or with rapidly changing kidney function. According to the NKDEP, eGFR >89 is normal, 60-89 shows mild impairment, 30-59 shows moderate impairment, 15-29 shows severe impairment and <15 is ESRD.     [name] Cholesterol 10/31/2014 193  0 - 200 mg/dL Final   Comment: ATP III Classification:       < 200        mg/dL        Desirable      200 - 239     mg/dL        Borderline High      >= 240        mg/dL        High     . Triglycerides 10/31/2014 302* <150 mg/dL Final  . HDL 10/31/2014 35* >=40 mg/dL Final   ** Please note change in reference range(s). **  . Total CHOL/HDL Ratio 10/31/2014 5.5   Final  . VLDL  10/31/2014 60* 0 - 40 mg/dL Final  . LDL Cholesterol 10/31/2014 98  0 - 99 mg/dL Final   Comment:   Total Cholesterol/HDL Ratio:CHD Risk                        Coronary Heart Disease Risk Table                                        Men       Women          1/2 Average Risk  3.4        3.3              Average Risk              5.0        4.4           2X Average Risk              9.6        7.1           3X Average Risk             23.4       11.0 Use the calculated Patient Ratio above and the CHD Risk table  to determine the patient's CHD Risk. ATP III Classification (LDL):       < 100        mg/dL         Optimal      100 - 129     mg/dL         Near or Above Optimal      130 - 159     mg/dL         Borderline High      160 - 189     mg/dL         High       > 190        mg/dL         Very High       Physical Examination:  BP 130/70 mmHg  Pulse 74  Temp(Src) 97.6 F (36.4 C) (Oral)  Ht _0  (1.778 m)  Wt 306 lb (138.801 kg)  BMI 43.91 kg/m2  SpO2 97%          ASSESSMENT:   Diabetes type 2, uncontrolled    He has had better blood sugar control with adding Trulicity  Currently on a regimen of 1.51 mg Trulicity along with glipizide ER 10 mg daily and metformin Tolerating all medications well He does need to have some further improvement in his blood sugars since his fasting readings are still mildly increased and postprandial readings are in the upper range  Also has not checked his blood sugars after meals as much lately. He has not had consultation with the dietitian for a while and does need to be scheduled   PLAN:  Start 1.5 mg Trulicity or better sugar control and facilitate weight loss which is important for his management He will call if blood sugars are in the low normal range and will need to reduce glipizide to 5 mg at that point Continue metformin More frequent readings after meals Consultation with dietitian Check A1c on the next visit He  needs to see an eye physician for periodic evaluation  Patient Instructions  Please check blood sugars at least half the time about 2 hours after any meal and 3 times per week on waking up.  Please bring blood sugar monitor to each visit. Recommended blood sugar levels about 2 hours after meal is 140-180 and on waking up 76-160  Trulicity 7.3XT    Anderia Lorenzo 12/14/2014, 9:46 AM   Note: This office note was prepared with Estate agent. Any transcriptional errors that result from this process are unintentional.

## 2014-12-14 NOTE — Patient Instructions (Addendum)
Please check blood sugars at least half the time about 2 hours after any meal and 3 times per week on waking up.  Please bring blood sugar monitor to each visit. Recommended blood sugar levels about 2 hours after meal is 140-180 and on waking up 64-847  Trulicity 1.5mg   May need to reduce Glipizide to 5mg  if sugars go <90

## 2014-12-14 NOTE — Progress Notes (Signed)
Pre visit review using our clinic review tool, if applicable. No additional management support is needed unless otherwise documented below in the visit note. 

## 2014-12-15 ENCOUNTER — Encounter: Payer: BLUE CROSS/BLUE SHIELD | Admitting: Dietician

## 2014-12-15 NOTE — Telephone Encounter (Signed)
Patient needs to have a venous Doppler of his legs. This is follow-up of a left leg clot. Please schedule the Doppler.

## 2014-12-15 NOTE — Telephone Encounter (Signed)
I tried to call [facility] [place], doppler could not be today since it is not STAT. Please schedule for Monday. Please schedule in the morning.

## 2014-12-15 NOTE — Telephone Encounter (Signed)
Hw should come in

## 2014-12-15 NOTE — Telephone Encounter (Signed)
Patient is in need of a referral for a blood clot in his leg.  Please find someone to get him some immediate help.   [phone]

## 2014-12-19 NOTE — Telephone Encounter (Signed)
PA started for Xarelto 20mg . Approved through [facility].

## 2014-12-20 ENCOUNTER — Telehealth: Payer: Self-pay

## 2014-12-20 ENCOUNTER — Ambulatory Visit (HOSPITAL_COMMUNITY)
Admission: RE | Admit: 2014-12-20 | Discharge: 2014-12-20 | Disposition: A | Discharge status: Home / Self Care | Payer: BLUE CROSS/BLUE SHIELD | Source: Ambulatory Visit | Attending: Emergency Medicine | Admitting: Emergency Medicine

## 2014-12-20 DIAGNOSIS — I82401 Acute embolism and thrombosis of unspecified deep veins of right lower extremity: Secondary | ICD-10-CM | POA: Diagnosis present

## 2014-12-20 DIAGNOSIS — C61 Malignant neoplasm of prostate: Secondary | ICD-10-CM

## 2014-12-20 DIAGNOSIS — I82402 Acute embolism and thrombosis of unspecified deep veins of left lower extremity: Secondary | ICD-10-CM

## 2014-12-20 NOTE — Telephone Encounter (Signed)
Received a call from [facility] who reported that the preliminary report on repeat Dopplar on pt's L leg is still positive for DVT. They will put report in EPIC. Notified Dr Everlene Farrier of results and he asked me to call pt and instruct him to continue to take his blood thinners and to come see him at 102 on Sat or next week. LMOM for pt to CB to give these instr's.

## 2014-12-20 NOTE — Telephone Encounter (Signed)
Pt called back and he agreed to instr's. Transferred him to Scheduling to see if Dr Everlene Farrier has any appts open this week or next. Otherwise he will get his walk in schedule.

## 2014-12-20 NOTE — Progress Notes (Signed)
Bilateral lower extremity venous duplex completed.  Right:  No evidence of DVT, superficial thrombosis, or Baker's cyst.  Left: DVT noted in the common femoral, femoral, popliteal, and gastrocnemius veins.  No evidence of superficial thrombosis.  No Baker's cyst.

## 2015-01-05 ENCOUNTER — Other Ambulatory Visit: Payer: Self-pay | Admitting: *Deleted

## 2015-01-05 MED ORDER — DULAGLUTIDE 1.5 MG/0.5ML ~~LOC~~ SOAJ
1.5000 mg | SUBCUTANEOUS | Status: DC
Start: 2015-01-05 — End: 2015-01-11

## 2015-01-11 ENCOUNTER — Encounter: Payer: Self-pay | Admitting: Emergency Medicine

## 2015-01-11 ENCOUNTER — Ambulatory Visit (INDEPENDENT_AMBULATORY_CARE_PROVIDER_SITE_OTHER): Payer: BLUE CROSS/BLUE SHIELD | Admitting: Emergency Medicine

## 2015-01-11 ENCOUNTER — Telehealth: Payer: Self-pay | Admitting: Cardiovascular Disease

## 2015-01-11 ENCOUNTER — Encounter: Payer: Self-pay | Admitting: Cardiovascular Disease

## 2015-01-11 ENCOUNTER — Ambulatory Visit (INDEPENDENT_AMBULATORY_CARE_PROVIDER_SITE_OTHER): Payer: BLUE CROSS/BLUE SHIELD | Admitting: Cardiovascular Disease

## 2015-01-11 ENCOUNTER — Ambulatory Visit (INDEPENDENT_AMBULATORY_CARE_PROVIDER_SITE_OTHER): Payer: BLUE CROSS/BLUE SHIELD

## 2015-01-11 VITALS — BP 152/66 | HR 62 | Temp 98.3°F | Resp 16 | Ht 70.0 in | Wt 303.2 lb

## 2015-01-11 VITALS — BP 134/70 | HR 58 | Ht 71.0 in | Wt 303.8 lb

## 2015-01-11 DIAGNOSIS — R0602 Shortness of breath: Secondary | ICD-10-CM

## 2015-01-11 DIAGNOSIS — R9431 Abnormal electrocardiogram [ECG] [EKG]: Secondary | ICD-10-CM | POA: Diagnosis not present

## 2015-01-11 DIAGNOSIS — C61 Malignant neoplasm of prostate: Secondary | ICD-10-CM | POA: Diagnosis not present

## 2015-01-11 DIAGNOSIS — E119 Type 2 diabetes mellitus without complications: Secondary | ICD-10-CM

## 2015-01-11 DIAGNOSIS — E785 Hyperlipidemia, unspecified: Secondary | ICD-10-CM

## 2015-01-11 DIAGNOSIS — I10 Essential (primary) hypertension: Secondary | ICD-10-CM | POA: Diagnosis not present

## 2015-01-11 DIAGNOSIS — Z23 Encounter for immunization: Secondary | ICD-10-CM

## 2015-01-11 DIAGNOSIS — I82402 Acute embolism and thrombosis of unspecified deep veins of left lower extremity: Secondary | ICD-10-CM

## 2015-01-11 DIAGNOSIS — R0600 Dyspnea, unspecified: Secondary | ICD-10-CM

## 2015-01-11 DIAGNOSIS — E1165 Type 2 diabetes mellitus with hyperglycemia: Secondary | ICD-10-CM

## 2015-01-11 DIAGNOSIS — IMO0002 Reserved for concepts with insufficient information to code with codable children: Secondary | ICD-10-CM

## 2015-01-11 MED ORDER — AMLODIPINE BESYLATE 2.5 MG PO TABS: 2.5000 mg | ORAL_TABLET | Freq: Every day | ORAL | Status: DC

## 2015-01-11 NOTE — Patient Instructions (Signed)
Your physician recommends that you schedule a follow-up appointment in: [place] has requested that you have an echocardiogram. Echocardiography is a painless test that uses sound waves to create images of your heart. It provides your doctor with information about the size and shape of your heart and how well your heart's chambers and valves are working. This procedure takes approximately one hour. There are no restrictions for this procedure.   Your physician has requested that you have en exercise stress myoview. For further information please visit [email or web address]. Please follow instruction sheet, as given.   START AMLODIPINE 2.5 MG ONCE DAILY

## 2015-01-11 NOTE — Progress Notes (Addendum)
Subjective:  This chart was scribed for Darlyne Russian, MD by Ladene Artist, ED Scribe. The patient was seen in room 21. Patient's care was started at 8:07 AM.   Patient ID: Tommy Yang, male    DOB: October 26, 1950, 64 y.o.   MRN: [id number]  Chief Complaint  Patient presents with  . Follow-up  . Diabetes   HPI HPI Comments: Tommy Yang is a 64 y.o. male, with a h/o HTN, DM, DVT, hypercholesterolemia, prostate CA, who presents to the [facility] for a follow-up regarding DM.   Diabetes  Pt checked his blood glucose this morning which was 116. He reports average readings of 177 after a meal. Pt reports 1 episode of elevated blood glucose earlier this week of 309 after having a cup of coffee with sugar. This resolved shortly. Pt states that he has not had a blood glucose over 200 in the past month.   DVT Pt reports a DVT that extends from his L knee to his groin. He reports cutting his finger and noticing a 1.5 inch long clot from his finger. Pt also reports SOB within a few minutes of walking but states that this resolves. SOB initially started in November 2015. He denies chest pain. Pt was last seen by cardiology in June 2015. Although heart sounds were normal, his cardiologist advised a stress test but pt declined since his insurance would not cover it.  Radiation  Pt finished radiation in March. He has a follow-up appointment with Dr. Louis Meckel for PSA test next month.   Dermatology  Pt had a dark brown wart removed from his left upper abdomen 8 days ago. Pt reports that the wart was benign.   Preventative Maintenance  Pt has not had acolonoscopy done. He states that the procedure was canceled due to his DVT. Pt has not been seen by ophthalmology.   Past Medical History  Diagnosis Date  . Hypertension   . Diabetes mellitus without complication   . DVT (deep venous thrombosis)   . Prostate cancer 12/06/13    gleason 4+3=7, 6/12 cores positive  . GERD  (gastroesophageal reflux disease)   . Hypercholesterolemia    Current Outpatient Prescriptions on File Prior to Visit  Medication Sig Dispense Refill  . atorvastatin (LIPITOR) 40 MG tablet TAKE ONE TABLET BY MOUTH ONCE DAILY. 90 tablet 3  . blood glucose meter kit and supplies KIT Dispense based on patient and insurance preference. Use as directed. 1 each 0  . Dulaglutide (TRULICITY) 1.5 FM/3.8GY SOPN Inject 1.5 mg into the skin once a week. 12 pen 3  . glipiZIDE (GLUCOTROL XL) 10 MG 24 hr tablet Take 1 tablet (10 mg total) by mouth daily with breakfast. 90 tablet 3  . glucose blood test strip Check twice daily. 100 each 12  . losartan (COZAAR) 100 MG tablet TAKE ONE TABLET BY MOUTH ONCE DAILY 90 tablet 3  . metFORMIN (GLUCOPHAGE) 1000 MG tablet Take 1 tablet (1,000 mg total) by mouth 2 (two) times daily with a meal. 180 tablet 0  . rivaroxaban (XARELTO) 20 MG TABS tablet TAKE ONE TABLET BY MOUTH ONCE DAILY WITH  SUPPER. 90 tablet 3  . glipiZIDE (GLUCOTROL XL) 5 MG 24 hr tablet     . losartan (COZAAR) 50 MG tablet     . omeprazole (PRILOSEC) 40 MG capsule Take 1 capsule (40 mg total) by mouth daily. (Patient not taking: Reported on 01/11/2015) 90 capsule 3  . sildenafil (VIAGRA) 100  MG tablet Take 0.5-1 tablets (50-100 mg total) by mouth daily as needed for erectile dysfunction. (Patient not taking: Reported on 01/11/2015) 5 tablet 11  . zoster vaccine live, PF, (ZOSTAVAX) 97416 UNT/0.65ML injection Inject 19,400 Units into the skin once. (Patient not taking: Reported on 01/11/2015) 1 each 0   No current facility-administered medications on file prior to visit.   No Known Allergies  Review of Systems  Respiratory: Positive for shortness of breath.   Cardiovascular: Negative for chest pain.   BP 152/66 mmHg  Pulse 62  Temp(Src) 98.3 F (36.8 C) (Oral)  Resp 16  Ht '5\' 10"'  (1.778 m)  Wt 303 lb 3.2 oz (137.531 kg)  BMI 43.50 kg/m2  SpO2 97%  BP: 138/70 on L    Objective:   Physical  Exam  Constitutional: He is oriented to person, place, and time. He appears well-developed and well-nourished. No distress.  HENT:  Head: Normocephalic and atraumatic.  Lip pursing movements.  Eyes: Conjunctivae and EOM are normal.  Neck: Neck supple. No tracheal deviation present.  Cardiovascular: Normal rate and regular rhythm.   Pulmonary/Chest: Effort normal and breath sounds normal. No respiratory distress.  Musculoskeletal: Normal range of motion.  Bilateral statis changes. No calf swelling.   Neurological: He is alert and oriented to person, place, and time.  Skin: Skin is warm and dry.  1x1 cm healing biopsy site to LUQ abdomen.   Psychiatric: He has a normal mood and affect. His behavior is normal.  Nursing note and vitals reviewed. [facility] reading (PRIMARY) by  Dr. Everlene Farrier no acute disease heart size normal.       Assessment & Plan:  1. DVT (deep venous thrombosis), left  - NM Pulmonary Perf and Vent; Future  2. Essential hypertension  - EKG 12-Lead. Patient has developed T-wave inversion V5 V6. I called Dr. Claiborne Billings and he was agreeable to see the patient and 11:00 today.  3. SOB (shortness of breath) on exertion  - DG Chest 2 View; Future - EKG 12-Lead - Ambulatory referral to Cardiology - NM Pulmonary Perf and Vent; Future Preliminary chest x-ray report shows no abnormality 4. Prostate cancer Under treatment by his urologist  5. Immunization due  - Pneumococcal conjugate vaccine 13-valent IM  6. Type 2 diabetes mellitus without complication  - Ambulatory referral to Ophthalmology   I personally performed the services described in this documentation, which was scribed in my presence. The recorded information has been reviewed and is accurate.  [name], MD  [facility]  01/11/2015 1:25 PM

## 2015-01-11 NOTE — Telephone Encounter (Signed)
Dr. Everlene Farrier stated that the pt had an appt with him today and he had an abnormal EKG. Dr. Everlene Farrier is wanting the pt to folloqw up with Dr. Claiborne Billings today.   Please advise

## 2015-01-12 ENCOUNTER — Other Ambulatory Visit: Payer: Self-pay

## 2015-01-12 ENCOUNTER — Encounter: Payer: Self-pay | Admitting: Cardiovascular Disease

## 2015-01-12 ENCOUNTER — Other Ambulatory Visit: Payer: Self-pay | Admitting: Emergency Medicine

## 2015-01-12 ENCOUNTER — Ambulatory Visit: Payer: BLUE CROSS/BLUE SHIELD

## 2015-01-12 DIAGNOSIS — R0602 Shortness of breath: Secondary | ICD-10-CM

## 2015-01-12 DIAGNOSIS — I82402 Acute embolism and thrombosis of unspecified deep veins of left lower extremity: Secondary | ICD-10-CM

## 2015-01-12 DIAGNOSIS — R9431 Abnormal electrocardiogram [ECG] [EKG]: Secondary | ICD-10-CM | POA: Insufficient documentation

## 2015-01-12 NOTE — Progress Notes (Signed)
Patient ID: Tommy Yang, male   DOB: 13-Sep-1950, 64 y.o.   MRN: [id number]      HPI:  Tommy Yang is a 64 y.o. male who is seen as an add-on today after I had spoken with Tommy Yang today for evaluation of a change in his ECG.  I last saw him one year ago.   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  He has undergone evaluation by Tommy Yang.  The patient has a history of lower extremity DVT, which was initially diagnosed in January with acute deep vein thrombosis involving the posterior tibial vein.  3 inches above the ankle, popliteal vein, gastrocnemius, multiple calf veins, proximal profunda, femoral, and common femoral veins.  He also had thrombus in the saphenofemoral junction.  The Doppler in February 2015 showed little change except for resolution of DVT in the profunda femoral vein.  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  He has been on anticoagulation with Xarelto 20 mg.  He has history of hypertension, and has been on losartan, 50 mg.  He has diabetes mellitus and is on metformin, as well as Januvia.  Also, is a history of hyperlipidemia on atorvastatin 40 mg.  He has a history of Tourette's syndrome.  He denies any significant chest pain.  Upon questioning, his sleep, he oftentimes wakes up in his gasping for breath.  He does snore.  He wakes up approximately 2-3 times per night.  When I saw him last year, prior to his planned prostate surgery, I I recommended that he undergo a nuclear perfusion study as well as an echo Doppler evaluation.  He never had these done due to issues with his insurance co-pay.  He presented to Tommy Yang office today , had complaints of increased shortness of breath particularly with activity.  He typically  notes this when he cuts the grass.  An ECG and Tommy Yang office revealed new T-wave abnormalities in lead 1 and L, V4 through V6 which was not present on his ECG.  One year ago.  The patient admits to gaining approximately 20 pounds with hormonal therapy for his prostate.  He also has poor sleep and typically goes to bed at 9 PM and wakes up at 4 AM.  He has a history of diabetes mellitus.  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  He has continued to be on Xarelto for anticoagulation.  He is on losartan 100 mg daily for hypertension.  He has been taking metformin and glipizide for his diabetes mellitus.  He is on Lipitor 40 mg daily for hyperlipidemia.  Past Medical History  Diagnosis Date  . Hypertension   . Diabetes mellitus without complication   . DVT (deep venous thrombosis)   . Prostate cancer 12/06/13    gleason 4+3=7, 6/12 cores positive  . GERD (gastroesophageal reflux disease)   . Hypercholesterolemia     Past Surgical History  Procedure Laterality Date  . Appendectomy    . Spine surgery   1964 and 1967    Tourette'ssyndrome  . Prostate biopsy  12/06/13    Gleason 4+3=7, volume 35 gm  . Knee surgery      right knee orthoscopic    No Known Allergies  Current Outpatient  Prescriptions  Medication Sig Dispense Refill  . atorvastatin (LIPITOR) 40 MG tablet TAKE ONE TABLET BY MOUTH ONCE DAILY. 90 tablet 3  . blood glucose meter kit and supplies KIT Dispense based on patient and insurance preference. Use as directed. 1 each 0  . glipiZIDE (GLUCOTROL XL) 10 MG 24 hr tablet Take 1 tablet (10 mg total) by mouth daily with breakfast. 90 tablet 3  . glucose blood test strip Check twice daily. 100 each 12  . losartan (COZAAR) 100 MG tablet TAKE ONE TABLET BY MOUTH ONCE DAILY 90 tablet 3  . metFORMIN (GLUCOPHAGE) 1000 MG tablet Take 1 tablet (1,000 mg total) by mouth 2 (two) times daily with a meal. 180 tablet 0  . rivaroxaban (XARELTO) 20 MG TABS tablet TAKE ONE  TABLET BY MOUTH ONCE DAILY WITH  SUPPER. 90 tablet 3  . zoster vaccine live, PF, (ZOSTAVAX) 58099 UNT/0.65ML injection Inject 19,400 Units into the skin once. 1 each 0  . amLODipine (NORVASC) 2.5 MG tablet Take 1 tablet (2.5 mg total) by mouth daily. 30 tablet 12   No current facility-administered medications for this visit.    Social he is married for 30 years.  He completed college at [employer].  He is retired.  He smoked for 20 years and quit in 2013.  He does drink  occasional alcohol.  Family History  Problem Relation Age of Onset  . Heart disease Mother     before age 63  . Diabetes Mother   . Hyperlipidemia Mother   . Varicose Veins Mother   . Heart disease Father   . Diabetes Father   . Cancer Neg Hx     ROS General: Negative; No fevers, chills, or night sweats HEENT: Negative; No changes in vision or hearing, sinus congestion, difficulty swallowing Pulmonary: Negative; No cough, wheezing, shortness of breath, hemoptysis Cardiovascular:  See HPI; No chest pain, presyncope, syncope, palpitations, Positive for chronic DVT GI: Negative; No nausea, vomiting, diarrhea, or abdominal pain GU: Positive for prostate CA; No dysuria, hematuria, or difficulty voiding Musculoskeletal: Negative; no myalgias, joint pain, or weakness Hematologic/Oncologic: Negative; no easy bruising, bleeding Endocrine: Negative; no heat/cold intolerance; no diabetes Neuro: Positive for Tourette's syndrome no changes in balance, headaches Skin: Negative; No rashes or skin lesions Psychiatric: Negative; No behavioral problems, depression Sleep: positive for snoring No daytime sleepiness, hypersomnolence, bruxism, restless legs, hypnogagnic hallucinations Other comprehensive 14 point system review is negative   Physical Exam BP 134/70 mmHg  Pulse 58  Ht '5\' 11"'  (1.803 m)  Wt 303 lb 12.8 oz (137.803 kg)  BMI 42.39 kg/m2 General: Alert, oriented, no distress.  Positive for 10 pound weight gain in the  last year Skin: normal turgor, no rashes, warm and dry HEENT: Normocephalic, atraumatic. Pupils equal round and reactive to light; sclera anicteric; extraocular muscles intact; Fundi without hemorrhages or exudates Nose without nasal septal hypertrophy Mouth/Parynx benign; Mallinpatti scale 3 Neck: No JVD, no carotid bruits; normal carotid upstroke Lungs: clear to ausculatation and percussion; no wheezing or rales Chest wall: without tenderness to palpitation Heart: PMI not displaced, RRR, s1 s2 normal, 1/6 systolic murmur, no diastolic murmur, no rubs, gallops, thrills, or heaves Abdomen: soft, nontender; no hepatosplenomehaly, BS+; abdominal aorta nontender and not dilated by palpation. Back: no CVA tenderness Pulses 2+ Musculoskeletal: full range of motion, normal strength, no joint deformities Extremities: no clubbing cyanosis or edema, Homan's sign negative  Neurologic: grossly nonfocal; Cranial nerves grossly wnl Psychologic: Normal mood and affect  ECG done  in our office (independently read by me): Sinus rhythm at 58 bpm..  When compared to his prior ECG of one year ago on 02/20/2014, these lateral T-wave changes are new.  I also reviewed the EKG from Dr. Yolanda Manges office and the T-wave changes seen on her ECG are similar.   June 2015 ECG (independently read by me): Normal sinus rhythm at 75 beats per minute.  No ectopy.  LABS:  BMP Latest Ref Rng 10/31/2014 03/01/2014 10/19/2013  Glucose 70 - 99 mg/dL 155(H) 129(H) 493(H)  BUN 6 - 23 mg/dL '16 14 13  ' Creatinine 0.50 - 1.35 mg/dL 0.84 0.85 0.89  Sodium 135 - 145 mEq/L 142 140 133(L)  Potassium 3.5 - 5.3 mEq/L 4.5 5.3 4.9  Chloride 96 - 112 mEq/L 101 102 96  CO2 19 - 32 mEq/L '24 26 27  ' Calcium 8.4 - 10.5 mg/dL 9.4 9.3 9.5   Hepatic Function Latest Ref Rng 10/31/2014 03/01/2014 05/06/2013  Total Protein 6.0 - 8.3 g/dL 6.8 7.0 7.2  Albumin 3.5 - 5.2 g/dL 4.1 3.9 4.3  AST 0 - 37 U/L '16 12 11  ' ALT 0 - 53 U/L '25 15 14  ' Alk Phosphatase 39  - 117 U/L 73 76 71  Total Bilirubin 0.2 - 1.2 mg/dL 0.6 0.6 0.7   CBC Latest Ref Rng 05/03/2014 03/01/2014 10/19/2013  WBC 4.0 - 10.5 K/uL 11.8(H) 10.6(H) 9.0  Hemoglobin 13.0 - 17.0 g/dL 15.1 14.6 14.9  Hematocrit 39.0 - 52.0 % 42.9 42.6 41.5  Platelets 150 - 400 K/uL 245 263 247   Lab Results  Component Value Date   MCV 80.2 05/03/2014   MCV 81.0 03/01/2014   MCV 80.4 10/19/2013   Lab Results  Component Value Date   TSH 1.160 03/01/2014   Lab Results  Component Value Date   HGBA1C 9.1 10/31/2014   Lipid Panel     Component Value Date/Time   CHOL 193 10/31/2014 0946   TRIG 302* 10/31/2014 0946   HDL 35* 10/31/2014 0946   CHOLHDL 5.5 10/31/2014 0946   VLDL 60* 10/31/2014 0946   LDLCALC 98 10/31/2014 0946    ASSESSMENT AND PLAN: Mr. Jailan Trimm is a 64 year old gentleman who was diagnosed prostate CA. last year and underwent treatment.  He  has been documented to have left lower extremity DVT and has been on anticoagulation since January 2015.  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  His blood pressure today is well controlled.  His clinical history is also suggestive of obstructive sleep apnea in that he often awakens gasping for breath and snores loudly.  Last year, prior to his prostate surgery.  I recommended that he have a nuclear perfusion study.  An echo Doppler assessment.  He never had these done.  He now admits to increasing exertional shortness of breath particularly when he cuts the grass.  He is bradycardic on ECG.  For this reason, I will add amlodipine.  Initially, a 2.5 mg to his medical regimen.  I'm scheduling him for an echo Doppler study.  I will also schedule him for an exercise Myoview scan for further evaluation of these T-wave abnormalities.  Ultimately, may also be worthwhile to evaluate for probable obstructive sleep apnea.  He is morbidly obese with a  body mass index of 42.39, and his weight today is 303 pounds.  He is diabetic without good control with recent hemoglobin A1c 9.1.  I will see him in 3 weeks for follow-up evaluation of the above studies and further recommendations will be made at that time.  Troy Sine, MD, [facility] 01/12/2015 5:03 PM

## 2015-01-14 NOTE — Telephone Encounter (Signed)
I saw pt the same day

## 2015-01-15 ENCOUNTER — Ambulatory Visit (HOSPITAL_COMMUNITY)
Admission: RE | Admit: 2015-01-15 | Discharge: 2015-01-15 | Disposition: A | Discharge status: Home / Self Care | Payer: BLUE CROSS/BLUE SHIELD | Source: Ambulatory Visit | Attending: Emergency Medicine | Admitting: Emergency Medicine

## 2015-01-15 ENCOUNTER — Encounter (HOSPITAL_COMMUNITY)
Admission: RE | Admit: 2015-01-15 | Discharge: 2015-01-15 | Disposition: A | Discharge status: Home / Self Care | Payer: BLUE CROSS/BLUE SHIELD | Source: Ambulatory Visit | Attending: Emergency Medicine | Admitting: Emergency Medicine

## 2015-01-15 DIAGNOSIS — I82402 Acute embolism and thrombosis of unspecified deep veins of left lower extremity: Secondary | ICD-10-CM

## 2015-01-15 DIAGNOSIS — R0602 Shortness of breath: Secondary | ICD-10-CM | POA: Diagnosis not present

## 2015-01-15 DIAGNOSIS — Z86718 Personal history of other venous thrombosis and embolism: Secondary | ICD-10-CM | POA: Insufficient documentation

## 2015-01-15 MED ORDER — TECHNETIUM TO 99M ALBUMIN AGGREGATED
6.0000 | Freq: Once | INTRAVENOUS | Status: AC | PRN
  Administered 2015-01-15: 6 via INTRAVENOUS

## 2015-01-15 MED ORDER — TECHNETIUM TC 99M DIETHYLENETRIAME-PENTAACETIC ACID: 40.0000 | Freq: Once | INTRAVENOUS | Status: AC | PRN

## 2015-01-23 ENCOUNTER — Other Ambulatory Visit: Payer: Self-pay

## 2015-01-23 DIAGNOSIS — E119 Type 2 diabetes mellitus without complications: Secondary | ICD-10-CM

## 2015-01-23 MED ORDER — BLOOD GLUCOSE MONITOR KIT: PACK | Status: AC

## 2015-01-30 ENCOUNTER — Telehealth (HOSPITAL_COMMUNITY): Payer: Self-pay

## 2015-01-30 NOTE — Telephone Encounter (Signed)
Encounter complete. 

## 2015-02-01 ENCOUNTER — Ambulatory Visit (HOSPITAL_COMMUNITY)
Admission: RE | Admit: 2015-02-01 | Discharge: 2015-02-01 | Disposition: A | Discharge status: Home / Self Care | Payer: BLUE CROSS/BLUE SHIELD | Source: Ambulatory Visit | Attending: Cardiovascular Disease | Admitting: Cardiovascular Disease

## 2015-02-01 ENCOUNTER — Ambulatory Visit (HOSPITAL_BASED_OUTPATIENT_CLINIC_OR_DEPARTMENT_OTHER)
Admission: RE | Admit: 2015-02-01 | Discharge: 2015-02-01 | Disposition: A | Discharge status: Home / Self Care | Payer: BLUE CROSS/BLUE SHIELD | Source: Ambulatory Visit | Attending: Cardiovascular Disease | Admitting: Cardiovascular Disease

## 2015-02-01 DIAGNOSIS — R0600 Dyspnea, unspecified: Secondary | ICD-10-CM | POA: Diagnosis not present

## 2015-02-01 MED ORDER — TECHNETIUM TC 99M SESTAMIBI GENERIC - CARDIOLITE
31.0000 | Freq: Once | INTRAVENOUS | Status: AC | PRN
  Administered 2015-02-01: 31 via INTRAVENOUS

## 2015-02-02 ENCOUNTER — Ambulatory Visit (HOSPITAL_COMMUNITY)
Admission: RE | Admit: 2015-02-02 | Discharge: 2015-02-02 | Disposition: A | Discharge status: Home / Self Care | Payer: BLUE CROSS/BLUE SHIELD | Source: Ambulatory Visit | Attending: Cardiovascular Disease | Admitting: Cardiovascular Disease

## 2015-02-02 ENCOUNTER — Other Ambulatory Visit: Payer: Self-pay

## 2015-02-02 LAB — MYOCARDIAL PERFUSION IMAGING
Estimated workload: 7 METS
LV dias vol: 98 mL
LV sys vol: 37 mL
MPHR: 158 {beats}/min
Nuc Stress EF: 62 %
Peak BP: 190 mmHg
Peak HR: 146 {beats}/min
Percent HR: 93 %
Percent of predicted max HR: 92 %
RPE: 27740
Rest HR: 69 {beats}/min
SDS: 7
SRS: 0
SSS: 7
Stage 1 DBP: 76 mmHg
Stage 1 Grade: 0 %
Stage 1 HR: 66 {beats}/min
Stage 1 SBP: 130 mmHg
Stage 1 Speed: 0 mph
Stage 2 Grade: 0 %
Stage 2 HR: 79 {beats}/min
Stage 2 Speed: 1 mph
Stage 3 Grade: 0 %
Stage 3 HR: 79 {beats}/min
Stage 3 Speed: 1 mph
Stage 4 DBP: 76 mmHg
Stage 4 Grade: 10 %
Stage 4 HR: 120 {beats}/min
Stage 4 SBP: 195 mmHg
Stage 4 Speed: 1.7 mph
Stage 5 DBP: 88 mmHg
Stage 5 Grade: 12 %
Stage 5 HR: 146 {beats}/min
Stage 5 SBP: 190 mmHg
Stage 5 Speed: 2.5 mph
Stage 6 DBP: 104 mmHg
Stage 6 Grade: 0 %
Stage 6 HR: 139 {beats}/min
Stage 6 SBP: 215 mmHg
Stage 6 Speed: 1.5 mph
Stage 7 Grade: 0 %
Stage 7 HR: 73 {beats}/min
Stage 7 Speed: 0 mph
TID: 0.9

## 2015-02-02 MED ORDER — METFORMIN HCL 1000 MG PO TABS: 1000.0000 mg | ORAL_TABLET | Freq: Two times a day (BID) | ORAL | Status: DC

## 2015-02-02 MED ORDER — TECHNETIUM TC 99M SESTAMIBI GENERIC - CARDIOLITE
30.5000 | Freq: Once | INTRAVENOUS | Status: AC | PRN
Start: 2015-02-02 — End: 2015-02-02
  Administered 2015-02-02: 31 via INTRAVENOUS

## 2015-02-02 NOTE — Telephone Encounter (Signed)
We actually haven't received any requests from pharm for this, but would like to get pt more than one 90 day RF if possible since we do have more problems with [facility] than other pharmacies and he has had problems in the past. Dr Everlene Farrier, I can only OK 3 mos of RFs on DM meds. Do you want to OK add'l RFs?

## 2015-02-02 NOTE — Telephone Encounter (Signed)
Patient requests refill on 3 month supply of Metformin. He doesn't understand why he doesn't have any refills. He was very upset because each time he requests refills on medications, the process takes too long. He says his pharmacy sends requests and our staff always says " we never got it." He says this has been going on for a year and he is tired of it. "He pays [name] to do a service" and shouldn't have to go thru this. Can he get refills? Can we address his concerns? Preferred pharmacy is [facility] on [address]. Cb# [phone]. He is out of medication and needs it today.

## 2015-02-06 ENCOUNTER — Telehealth: Payer: Self-pay | Admitting: Cardiovascular Disease

## 2015-02-06 ENCOUNTER — Encounter: Payer: Self-pay | Admitting: *Deleted

## 2015-02-06 NOTE — Telephone Encounter (Signed)
Received records from [facility] for appointment on 03/14/15 with Dr Claiborne Billings.  Records given to [facility] (medical records) for Dr Evette Georges schedule on 03/14/15. lp

## 2015-02-08 ENCOUNTER — Telehealth: Payer: Self-pay | Admitting: Cardiovascular Disease

## 2015-02-08 ENCOUNTER — Other Ambulatory Visit (INDEPENDENT_AMBULATORY_CARE_PROVIDER_SITE_OTHER): Payer: BLUE CROSS/BLUE SHIELD

## 2015-02-08 DIAGNOSIS — E782 Mixed hyperlipidemia: Secondary | ICD-10-CM | POA: Diagnosis not present

## 2015-02-08 DIAGNOSIS — IMO0002 Reserved for concepts with insufficient information to code with codable children: Secondary | ICD-10-CM

## 2015-02-08 DIAGNOSIS — E1165 Type 2 diabetes mellitus with hyperglycemia: Secondary | ICD-10-CM

## 2015-02-08 LAB — BASIC METABOLIC PANEL
BUN: 16 mg/dL (ref 6–23)
CO2: 29 mEq/L (ref 19–32)
Calcium: 9.6 mg/dL (ref 8.4–10.5)
Chloride: 103 mEq/L (ref 96–112)
Creatinine, Ser: 0.9 mg/dL (ref 0.40–1.50)
GFR: 90.77 mL/min (ref >60.00)
Glucose, Bld: 122 mg/dL — ABNORMAL HIGH (ref 70–99)
Potassium: 4.5 mEq/L (ref 3.5–5.1)
Sodium: 137 mEq/L (ref 135–145)

## 2015-02-08 LAB — LIPID PANEL
Cholesterol: 170 mg/dL (ref 0–200)
HDL: 35.4 mg/dL — ABNORMAL LOW (ref >39.00)
NonHDL: 134.6
Total CHOL/HDL Ratio: 5
Triglycerides: 210 mg/dL — ABNORMAL HIGH (ref 0.0–149.0)
VLDL: 42 mg/dL — ABNORMAL HIGH (ref 0.0–40.0)

## 2015-02-08 LAB — LDL CHOLESTEROL, DIRECT: Direct LDL: 103 mg/dL

## 2015-02-08 LAB — HEMOGLOBIN A1C: Hgb A1c MFr Bld: 7.6 % — ABNORMAL HIGH (ref 4.6–6.5)

## 2015-02-08 NOTE — Telephone Encounter (Signed)
Spoke with Katrina from [facility] - Dr. Claiborne Billings did not order any labs She communicated this with patient

## 2015-02-13 ENCOUNTER — Encounter: Payer: Self-pay | Admitting: Endocrinology

## 2015-02-13 ENCOUNTER — Ambulatory Visit (INDEPENDENT_AMBULATORY_CARE_PROVIDER_SITE_OTHER): Payer: BLUE CROSS/BLUE SHIELD | Admitting: Endocrinology

## 2015-02-13 ENCOUNTER — Other Ambulatory Visit: Payer: Self-pay | Admitting: Endocrinology

## 2015-02-13 VITALS — BP 138/78 | HR 78 | Temp 98.1°F | Resp 16 | Ht 71.0 in | Wt 305.4 lb

## 2015-02-13 DIAGNOSIS — E782 Mixed hyperlipidemia: Secondary | ICD-10-CM | POA: Diagnosis not present

## 2015-02-13 DIAGNOSIS — E1165 Type 2 diabetes mellitus with hyperglycemia: Secondary | ICD-10-CM

## 2015-02-13 DIAGNOSIS — IMO0002 Reserved for concepts with insufficient information to code with codable children: Secondary | ICD-10-CM

## 2015-02-13 MED ORDER — TRULICITY 1.5 MG/0.5ML ~~LOC~~ SOAJ: SUBCUTANEOUS | Status: DC

## 2015-02-13 MED ORDER — METFORMIN HCL 1000 MG PO TABS: ORAL_TABLET | ORAL | Status: DC

## 2015-02-13 NOTE — Patient Instructions (Addendum)
Check blood sugars on waking up .[name] 3-4 .[name] times a week Also check blood sugars about 2 hours after a meal and do this after different meals by rotation  Recommended blood sugar levels on waking up is 90-130 and about 2 hours after meal is 140-180 Please bring blood sugar monitor to each visit.  Take 1 1/2 Metformin at dinner  Add a protein with each meal

## 2015-02-13 NOTE — Progress Notes (Signed)
Patient ID: Tommy Yang, male   DOB: 04-03-1951, 64 y.o.   MRN: [id number]           Reason for Appointment: Follow-up for Type 2 Diabetes  Referring physician: Daub  History of Present Illness:          Diagnosis: Type 2 diabetes mellitus, date of diagnosis: 05/2013       Past history:   He apparently had significant hyperglycemia in 2014 when seen in the urgent care center but did not establish with a PCP for control. He was started on treatment for his diabetes in 10/2013 and he was initially treated with metformin and Januvia A few months later he was also given glipizide ER to help his control when his A1c had gone up to 10.9 He says he was also seen by dietitian but no record available of this. Subsequently his blood sugar control had improved with A1c down to 6.8 in 9/15  Recent history:   On his consultation in 3/16 he was having persistently high blood sugars and weight gain; A1c was 9.1% despite taking glipizide ER, Januvia and metformin.  With taking Trulicity instead of Januvia he has been able to improve his blood sugar control overall A1c is now down to 7.6% He has been tolerating Trulicity without any difficulty Although he has lost only about 4 pounds he is not gaining weight as before  Current blood sugar patterns and problems identified:  He has overall mild increase in fasting blood sugars with average just over 150  Blood sugars are somewhat variable in the afternoons and quite variable around supper time  He is not checking blood sugars after supper despite instructions to do so  He did not see the dietitian as instructed and 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  Still eating unbalanced meals sometimes like cereal in the morning, has no readings after breakfast  Although he is trying to walk regularly has not been able to lose weight recently  He says that if he has a high reading in the afternoon he will take an extra  metformin sometimes and this will improve his blood sugar        Oral hypoglycemic drugs the patient is taking are: Glipizide ER 10 mg in am, metformin 1 g twice a day Side effects from medications have been: None Compliance with the medical regimen: Good  Hypoglycemia: None recently    Glucose monitoring:  done 1-2 times a day         Glucometer: One Touch.      Blood Glucose readings by recall as follows  Mean values apply above for all meters except median for One Touch  PRE-MEAL Fasting Lunch Dinner Bedtime Overall  Glucose range:  148-173   67-218   110-245     Mean/median:  157    168    153   Self-care: The diet that the patient has been following is: tries to limit starches and fats .     Meals: 3 meals per day. Breakfast is usually cereal, frequently having soup for lunch           Exercise: walking up to 1 mile, 5 days a week in the mornings         Dietician visit, most recent:?  2015 .               Weight history:    Wt Readings from Last 3 Encounters:  02/13/15 305 lb  6.4 oz (138.529 kg)  02/01/15 303 lb (137.44 kg)  01/11/15 303 lb 12.8 oz (137.803 kg)    Glycemic control:   Lab Results  Component Value Date   HGBA1C 7.6* 02/08/2015   HGBA1C 9.1 10/31/2014   HGBA1C 6.8 05/03/2014   Lab Results  Component Value Date   MICROALBUR 11.88* 05/03/2014   LDLCALC 98 10/31/2014   CREATININE 0.90 02/08/2015         Medication List       This list is accurate as of: 02/13/15  9:08 PM.  Always use your most recent med list.               amLODipine 2.5 MG tablet  Commonly known as:  NORVASC  Take 1 tablet (2.5 mg total) by mouth daily.     atorvastatin 40 MG tablet  Commonly known as:  LIPITOR  TAKE ONE TABLET BY MOUTH ONCE DAILY.     blood glucose meter kit and supplies Kit  Test blood sugar daily as directed. Dx code: E11.9     cholecalciferol 1000 UNITS tablet  Commonly known as:  VITAMIN D  Take 1,000 Units by mouth daily.     Fish Oil 500  MG Caps  Take by mouth.     glipiZIDE 10 MG 24 hr tablet  Commonly known as:  GLUCOTROL XL  Take 1 tablet (10 mg total) by mouth daily with breakfast.     glucose blood test strip  Check twice daily.     losartan 100 MG tablet  Commonly known as:  COZAAR  TAKE ONE TABLET BY MOUTH ONCE DAILY     metFORMIN 1000 MG tablet  Commonly known as:  GLUCOPHAGE  Pt is to take 1 and half tabs per day     rivaroxaban 20 MG Tabs tablet  Commonly known as:  XARELTO  TAKE ONE TABLET BY MOUTH ONCE DAILY WITH  SUPPER.     TRULICITY 1.5 ZO/1.0RU Sopn  Generic drug:  Dulaglutide  Inject contents of one(1) pen per week     zoster vaccine live (PF) 19400 UNT/0.65ML injection  Commonly known as:  ZOSTAVAX  Inject 19,400 Units into the skin once.        Allergies: No Known Allergies  Past Medical History  Diagnosis Date  . Hypertension   . Diabetes mellitus without complication   . DVT (deep venous thrombosis)   . Prostate cancer 12/06/13    gleason 4+3=7, 6/12 cores positive  . GERD (gastroesophageal reflux disease)   . Hypercholesterolemia     Past Surgical History  Procedure Laterality Date  . Appendectomy    . Spine surgery   1964 and 1967    Tourette'ssyndrome  . Prostate biopsy  12/06/13    Gleason 4+3=7, volume 35 gm  . Knee surgery      right knee orthoscopic    Family History  Problem Relation Age of Onset  . Heart disease Mother     before age 5  . Diabetes Mother   . Hyperlipidemia Mother   . Varicose Veins Mother   . Heart disease Father   . Diabetes Father   . Cancer Neg Hx     Social History:  reports that he quit smoking about 2 years ago. His smoking use included Cigarettes. He has a 11.5 pack-year smoking history. He has never used smokeless tobacco. He reports that he does not drink alcohol or use illicit drugs.    Review of Systems   Most  recent eye exam was 2 years ago       Lipids: he has had significant hyperlipidemia and has been treated with  Lipitor 40 mg daily only; no CAD and recently had negative stress test        Lab Results  Component Value Date   CHOL 170 02/08/2015   HDL 35.40* 02/08/2015   LDLCALC 98 10/31/2014   LDLDIRECT 103.0 02/08/2015   TRIG 210.0* 02/08/2015   CHOLHDL 5 02/08/2015                   The blood pressure has been treated with losartan 100 mg   LABS:  Lab on 02/08/2015  Component Date Value Ref Range Status  . Hgb A1c MFr Bld 02/08/2015 7.6* 4.6 - 6.5 % Final   Glycemic Control Guidelines for People with Diabetes:Non Diabetic:  <6%Goal of Therapy: <7%Additional Action Suggested:  >8%   . Cholesterol 02/08/2015 170  0 - 200 mg/dL Final   ATP III Classification       Desirable:  < 200 mg/dL               Borderline High:  200 - 239 mg/dL          High:  > = 240 mg/dL  . Triglycerides 02/08/2015 210.0* 0.0 - 149.0 mg/dL Final   Normal:  <150 mg/dLBorderline High:  150 - 199 mg/dL  . HDL 02/08/2015 35.40* >39.00 mg/dL Final  . VLDL 02/08/2015 42.0* 0.0 - 40.0 mg/dL Final  . Total CHOL/HDL Ratio 02/08/2015 5   Final                  Men          Women1/2 Average Risk     3.4          3.3Average Risk          5.0          4.42X Average Risk          9.6          7.13X Average Risk          15.0          11.0                      . NonHDL 02/08/2015 134.60   Final   NOTE:  Non-HDL goal should be 30 mg/dL higher than patient's LDL goal (i.e. LDL goal of < 70 mg/dL, would have non-HDL goal of < 100 mg/dL)  . Sodium 02/08/2015 137  135 - 145 mEq/L Final  . Potassium 02/08/2015 4.5  3.5 - 5.1 mEq/L Final  . Chloride 02/08/2015 103  96 - 112 mEq/L Final  . CO2 02/08/2015 29  19 - 32 mEq/L Final  . Glucose, Bld 02/08/2015 122* 70 - 99 mg/dL Final  . BUN 02/08/2015 16  6 - 23 mg/dL Final  . Creatinine, Ser 02/08/2015 0.90  0.40 - 1.50 mg/dL Final  . Calcium 02/08/2015 9.6  8.4 - 10.5 mg/dL Final  . GFR 02/08/2015 90.77  >60.00 mL/min Final  . Direct LDL 02/08/2015 103.0   Final   Optimal:  <100  mg/dLNear or Above Optimal:  100-129 mg/dLBorderline High:  130-159 mg/dLHigh:  160-189 mg/dLVery High:  >190 mg/dL    Physical Examination:  BP 138/78 mmHg  Pulse 78  Temp(Src) 98.1 F (36.7 C)  Resp 16  Ht 5' 11" (1.803 m)  Wt 305 lb 6.4  oz (138.529 kg)  BMI 42.61 kg/m2  SpO2 97%          ASSESSMENT:   Diabetes type 2, uncontrolled   See history of present illness for detailed discussion of his current management, blood sugar patterns and problems identified    Currently on a regimen of 5.3GD Trulicity along with glipizide ER 10 mg daily and metformin 1 g twice a day  Although his A1c has improved to 7.6 he still has moderately high blood sugars most of the time  Still has difficulty losing weight even though he is trying to start doing some walking.   Tolerating all medications well Recently has had mild persistent increase in fasting blood sugars and some high readings in the afternoons Also has not checked his blood sugars after  breakfast and  Evening meals as  directed  PLAN:   he will increase his metformin to 1-1/2 tablets at supper time for better fasting blood sugar control Increase frequency and duration of walking for exercise Consider adding Invokana if blood sugars are not improving Discussed adding protein with every meal especially breakfast He may still consider dietitian consultation if he does not lose any weight   HYPERLIPIDEMIA: LDL is still relatively borderline, may consider increasing Lipitor to 80 mg, will deferred to PCP   Patient Instructions  Check blood sugars on waking up .[name] 3-4 .[name] times a week Also check blood sugars about 2 hours after a meal and do this after different meals by rotation  Recommended blood sugar levels on waking up is 90-130 and about 2 hours after meal is 140-180 Please bring blood sugar monitor to each visit.  Take 1 1/2 Metformin at dinner  Add a protein with each meal    Laurel Harnden 02/13/2015, 9:08 PM   Note:  This office note was prepared with Estate agent. Any transcriptional errors that result from this process are unintentional.

## 2015-02-26 ENCOUNTER — Other Ambulatory Visit: Payer: Self-pay | Admitting: Endocrinology

## 2015-03-12 ENCOUNTER — Ambulatory Visit (INDEPENDENT_AMBULATORY_CARE_PROVIDER_SITE_OTHER): Payer: BLUE CROSS/BLUE SHIELD | Admitting: Podiatry

## 2015-03-12 ENCOUNTER — Encounter: Payer: Self-pay | Admitting: Podiatry

## 2015-03-12 VITALS — BP 132/73 | HR 61 | Resp 15

## 2015-03-12 DIAGNOSIS — L600 Ingrowing nail: Secondary | ICD-10-CM

## 2015-03-12 DIAGNOSIS — M79673 Pain in unspecified foot: Secondary | ICD-10-CM

## 2015-03-12 NOTE — Patient Instructions (Signed)

## 2015-03-13 NOTE — Progress Notes (Signed)
Subjective:     Patient ID: Tommy Yang, male   DOB: [birth year], 64 y.o.   MRN: [id number]  HPI patient presents stating the medial border has incurvated and bothering me and I feel like the skin may have grown back into the corner   Review of Systems     Objective:   Physical Exam Vascular status is intact no other change in health history with incurvation nail border left hallux medial side    Assessment:     Doing well with ingrown toenail due to skin encroachment medial side left hallux    Plan:     Reviewed removal of this corner and explained procedure. Reviewed risk and patient wants procedure and I infiltrated 60 mg like Marcaine mixture remove medial border exposed matrix and applied phenol 3 applications 30 seconds followed by alcohol lavage and sterile dressing instructed on soaks and reappoint

## 2015-03-14 ENCOUNTER — Encounter: Payer: Self-pay | Admitting: Cardiovascular Disease

## 2015-03-14 ENCOUNTER — Ambulatory Visit (INDEPENDENT_AMBULATORY_CARE_PROVIDER_SITE_OTHER): Payer: BLUE CROSS/BLUE SHIELD | Admitting: Cardiovascular Disease

## 2015-03-14 VITALS — BP 120/70 | HR 84 | Ht 71.0 in | Wt 302.2 lb

## 2015-03-14 DIAGNOSIS — G478 Other sleep disorders: Secondary | ICD-10-CM

## 2015-03-14 DIAGNOSIS — G4720 Circadian rhythm sleep disorder, unspecified type: Secondary | ICD-10-CM

## 2015-03-14 DIAGNOSIS — R0602 Shortness of breath: Secondary | ICD-10-CM

## 2015-03-14 DIAGNOSIS — E1165 Type 2 diabetes mellitus with hyperglycemia: Secondary | ICD-10-CM

## 2015-03-14 DIAGNOSIS — G4733 Obstructive sleep apnea (adult) (pediatric): Secondary | ICD-10-CM | POA: Diagnosis not present

## 2015-03-14 DIAGNOSIS — Z7901 Long term (current) use of anticoagulants: Secondary | ICD-10-CM

## 2015-03-14 DIAGNOSIS — IMO0002 Reserved for concepts with insufficient information to code with codable children: Secondary | ICD-10-CM

## 2015-03-14 NOTE — Progress Notes (Signed)
Patient ID: Tommy Yang, male   DOB: [birth year], 64 y.o.   MRN: [id number]      HPI:  Tommy Yang is a 64 y.o. male who presents for a 2 month follow-up evaluation.  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  He has undergone evaluation by Dr. Tyler Pita.  The patient has a history of lower extremity DVT and has been on anticoagulation with Xarelto 20 mg.  He has history of hypertension, and has been on losartan, 50 mg.  He has diabetes mellitus and is on metformin and glipizide  He has a history of hyperlipidemia on atorvastatin 40 mg.  He has a history of Tourette's syndrome.  He denies any significant chest pain.  Upon questioning, his sleep, he oftentimes wakes up in his gasping for breath.  He does snore.  He wakes up approximately 2-3 times per night.  Last year prior to his planned prostate surgery, I recommended that he undergo a nuclear perfusion study as well as an echo Doppler evaluation.  He never had these done due to issues with his insurance co-pay.  He presented to Dr. Gerald Dexter office in May 2016 with complaints of increased shortness of breath particularly with activity.  He typically notes this when he cuts the grass.  An ECG and Dr. Josepha Pigg office revealed new T-wave abnormalities in lead 1 and L, V4 through V6 which was not present on his ECG one year ago.  I saw him as an add-on  On 01/11/2015. Be socially was referred for an echo Doppler study as well as a nuclear perfusion scan. His echo Doppler study revealed an ejection fraction of 55-60%.  There was mild focal basal hypertrophy of the septum. Wall motion was normal.  There was mild grade 1 diastolic dysfunction.  A nuclear perfusion study revealed normal myocardial perfusion.  This was a 2 day study due to his obesity.  He did not have ECG changes of ischemia, but did have an occasional PVC noted touring the test.  Tommy Yang denies recent chest pain. Upon further questioning, he has very poor sleep.  He often wakes up at least every 2 hours.  His sleep is nonrestorative.  He snores.  During the daytime if he is watching television  he may fall asleep.   Past Medical History  Diagnosis Date  . Hypertension   . Diabetes mellitus without complication   . DVT (deep venous thrombosis)   . Prostate cancer 12/06/13    gleason 4+3=7, 6/12 cores positive  . GERD (gastroesophageal reflux disease)   . Hypercholesterolemia     Past Surgical History  Procedure Laterality Date  . Appendectomy    . Spine surgery   1964 and 1967    Tourette'ssyndrome  . Prostate biopsy  12/06/13    Gleason 4+3=7, volume 35 gm  . Knee surgery      right knee orthoscopic    No Known Allergies  Current Outpatient Prescriptions  Medication Sig Dispense Refill  . amLODipine (NORVASC) 2.5 MG tablet Take 1 tablet (2.5 mg total) by mouth daily. 30 tablet 12  . atorvastatin (LIPITOR) 40 MG tablet TAKE ONE TABLET BY MOUTH ONCE DAILY. 90 tablet 3  . blood glucose meter kit and supplies KIT Test blood sugar daily as directed. Dx code: E11.9 1 each 0  . cholecalciferol (VITAMIN D) 1000 UNITS tablet Take 1,000 Units by mouth  daily.    . glipiZIDE (GLUCOTROL XL) 10 MG 24 hr tablet Take 1 tablet (10 mg total) by mouth daily with breakfast. 90 tablet 3  . glucose blood test strip Check twice daily. 100 each 12  . losartan (COZAAR) 100 MG tablet TAKE ONE TABLET BY MOUTH ONCE DAILY 90 tablet 3  . metFORMIN (GLUCOPHAGE) 1000 MG tablet Pt is to take 1 and half tabs per day 135 tablet 1  . Omega-3 Fatty Acids (FISH OIL) 500 MG CAPS Take by mouth.    . rivaroxaban (XARELTO) 20 MG TABS tablet TAKE ONE TABLET BY MOUTH ONCE DAILY WITH  SUPPER. 90 tablet 3  . TRULICITY 1.5 MH/9.6QI SOPN Inject contents of one(1) pen per week 4 pen 3  . zoster vaccine live, PF, (ZOSTAVAX) 29798 UNT/0.65ML injection Inject 19,400 Units into the skin once. 1 each 0    No current facility-administered medications for this visit.    Social he is married for 30 years.  He completed college at [employer].  He is retired.  He smoked for 20 years and quit in 2013.  He does drink  occasional alcohol.  Family History  Problem Relation Age of Onset  . Heart disease Mother     before age 49  . Diabetes Mother   . Hyperlipidemia Mother   . Varicose Veins Mother   . Heart disease Father   . Diabetes Father   . Cancer Neg Hx     ROS General: Negative; No fevers, chills, or night sweats;  Positive for obesity without weight loss HEENT: Negative; No changes in vision or hearing, sinus congestion, difficulty swallowing Pulmonary: Negative; No cough, wheezing, shortness of breath, hemoptysis Cardiovascular:  See HPI;  Positive for chronic DVT GI: Negative; No nausea, vomiting, diarrhea, or abdominal pain GU: Positive for prostate CA; No dysuria, hematuria, or difficulty voiding Musculoskeletal: Negative; no myalgias, joint pain, or weakness Hematologic/Oncologic: Negative; no easy bruising, bleeding Endocrine: Negative; no heat/cold intolerance; no diabetes Neuro: Positive for Tourette's syndrome no changes in balance, headaches Skin: Negative; No rashes or skin lesions Psychiatric: Negative; No behavioral problems, depression Sleep: positive for snoring No daytime sleepiness, hypersomnolence, bruxism, restless legs, hypnogagnic hallucinations Other comprehensive 14 point system review is negative   Physical Exam BP 120/70 mmHg  Pulse 84  Ht '5\' 11"'  (1.803 m)  Wt 302 lb 3.2 oz (137.077 kg)  BMI 42.17 kg/m2   Wt Readings from Last 3 Encounters:  03/14/15 302 lb 3.2 oz (137.077 kg)  02/13/15 305 lb 6.4 oz (138.529 kg)  02/01/15 303 lb (137.44 kg)   General: Alert, oriented, no distress.   Skin: normal turgor, no rashes, warm and dry HEENT: Normocephalic, atraumatic. Pupils equal round and reactive to light; sclera anicteric; extraocular muscles  intact; Fundi without hemorrhages or exudates Nose without nasal septal hypertrophy Mouth/Parynx benign; Mallinpatti scale 3 Neck: No JVD, no carotid bruits; normal carotid upstroke Lungs: clear to ausculatation and percussion; no wheezing or rales Chest wall: without tenderness to palpitation Heart: PMI not displaced, RRR, s1 s2 normal, 1/6 systolic murmur, no diastolic murmur, no rubs, gallops, thrills, or heaves Abdomen: soft, nontender; no hepatosplenomehaly, BS+; abdominal aorta nontender and not dilated by palpation. Back: no CVA tenderness Pulses 2+ Musculoskeletal: full range of motion, normal strength, no joint deformities Extremities: no clubbing cyanosis or edema, Homan's sign negative  Neurologic: grossly nonfocal; Cranial nerves grossly wnl Psychologic: Normal mood and affect  ECG  Not done today with his recent stress test.  May  2016 ECG done in our office (independently read by me): Sinus rhythm at 58 bpm..  When compared to his prior ECG of one year ago on 02/20/2014, these lateral T-wave changes are new.  I also reviewed the EKG from Dr. Yolanda Manges office and the T-wave changes seen on her ECG are similar.   June 2015 ECG (independently read by me): Normal sinus rhythm at 75 beats per minute.  No ectopy.  LABS:  BMP Latest Ref Rng 02/08/2015 10/31/2014 03/01/2014  Glucose 70 - 99 mg/dL 122(H) 155(H) 129(H)  BUN 6 - 23 mg/dL '16 16 14  ' Creatinine 0.40 - 1.50 mg/dL 0.90 0.84 0.85  Sodium 135 - 145 mEq/L 137 142 140  Potassium 3.5 - 5.1 mEq/L 4.5 4.5 5.3  Chloride 96 - 112 mEq/L 103 101 102  CO2 19 - 32 mEq/L '29 24 26  ' Calcium 8.4 - 10.5 mg/dL 9.6 9.4 9.3   Hepatic Function Latest Ref Rng 10/31/2014 03/01/2014 05/06/2013  Total Protein 6.0 - 8.3 g/dL 6.8 7.0 7.2  Albumin 3.5 - 5.2 g/dL 4.1 3.9 4.3  AST 0 - 37 U/L '16 12 11  ' ALT 0 - 53 U/L '25 15 14  ' Alk Phosphatase 39 - 117 U/L 73 76 71  Total Bilirubin 0.2 - 1.2 mg/dL 0.6 0.6 0.7   CBC Latest Ref Rng 05/03/2014 03/01/2014  10/19/2013  WBC 4.0 - 10.5 K/uL 11.8(H) 10.6(H) 9.0  Hemoglobin 13.0 - 17.0 g/dL 15.1 14.6 14.9  Hematocrit 39.0 - 52.0 % 42.9 42.6 41.5  Platelets 150 - 400 K/uL 245 263 247   Lab Results  Component Value Date   MCV 80.2 05/03/2014   MCV 81.0 03/01/2014   MCV 80.4 10/19/2013   Lab Results  Component Value Date   TSH 1.160 03/01/2014   Lab Results  Component Value Date   HGBA1C 7.6* 02/08/2015   Lipid Panel     Component Value Date/Time   CHOL 170 02/08/2015 0838   TRIG 210.0* 02/08/2015 0838   HDL 35.40* 02/08/2015 0838   CHOLHDL 5 02/08/2015 0838   VLDL 42.0* 02/08/2015 0838   LDLCALC 98 10/31/2014 0946   LDLDIRECT 103.0 02/08/2015 0838    ASSESSMENT AND PLAN: Mr. Tommy Yang is a 64 year old gentleman who was diagnosed prostate CA. last year and underwent treatment.  He  has been documented to have left lower extremity DVT and has been on anticoagulation since January 2015.  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  His blood pressure today is well controlled.  He is tolerating the addition of amlodipine 2.5 mg daily which was started at his last office visit in addition to his losartan 100 mg.  He is on atorvastatin 40 mg for hyperlipidemia.  He continues to have high triglycerides.  I have suggested further increase of his fish oil omega-3 fatty acid therapy.  I reviewed his echo Doppler study which confirms normal systolic function with mild grade 1 diastolic dysfunction.  I suspect this diastolic dysfunction may be contributory to some of his exertional dyspnea.  He is morbidly obese.  We discussed the importance of significant weight loss.  His nuclear perfusion study argues against an ischemic etiology to his exertional dyspnea.  I suspect he has significant obstructive sleep apnea.  I have recommended definitive evaluation with a sleep study.  I will schedule this to be  done at [facility] long hospital sleep lab in a split-night  protocol.  If he initially meets criteria.  I reviewed the adverse effects of obstructive sleep apnea.  If untreated.  On cardiovascular risk.  I will see him in 4 months for reevaluation or sooner from his arise.  Troy Sine, MD, [facility] 03/14/2015 9:46 AM

## 2015-03-14 NOTE — Patient Instructions (Signed)
Your physician has recommended that you have a sleep study. This test records several body functions during sleep, including: brain activity, eye movement, oxygen and carbon dioxide blood levels, heart rate and rhythm, breathing rate and rhythm, the flow of air through your mouth and nose, snoring, body muscle movements, and chest and belly movement. This will be scheduled at [place].  Your physician recommends that you schedule a follow-up appointment in: 4 months in a sleep clinic.

## 2015-03-22 LAB — HM DIABETES EYE EXAM

## 2015-04-05 ENCOUNTER — Encounter: Payer: Self-pay | Admitting: Emergency Medicine

## 2015-04-05 ENCOUNTER — Ambulatory Visit (INDEPENDENT_AMBULATORY_CARE_PROVIDER_SITE_OTHER): Payer: BLUE CROSS/BLUE SHIELD | Admitting: Emergency Medicine

## 2015-04-05 VITALS — BP 133/71 | HR 62 | Temp 98.1°F | Resp 16 | Ht 71.0 in | Wt 298.2 lb

## 2015-04-05 DIAGNOSIS — I499 Cardiac arrhythmia, unspecified: Secondary | ICD-10-CM | POA: Diagnosis not present

## 2015-04-05 DIAGNOSIS — I82402 Acute embolism and thrombosis of unspecified deep veins of left lower extremity: Secondary | ICD-10-CM

## 2015-04-05 DIAGNOSIS — E1165 Type 2 diabetes mellitus with hyperglycemia: Secondary | ICD-10-CM

## 2015-04-05 DIAGNOSIS — IMO0002 Reserved for concepts with insufficient information to code with codable children: Secondary | ICD-10-CM

## 2015-04-05 LAB — MICROALBUMIN, URINE: Microalb, Ur: 9.9 mg/dL — ABNORMAL HIGH (ref <2.0)

## 2015-04-05 NOTE — Progress Notes (Addendum)
This chart was scribed for [name], MD by Marti Sleigh, Medical Scribe. This patient was seen in Room 23 and the patient's care was started at 9:23 AM.  Chief Complaint:  Chief Complaint  Patient presents with  . Diabetes  . Hypertension  . DVT  . Hand Pain    thinks he has trigger finger on both hands    HPI: Tommy Yang is a 64 y.o. male with a hx of DM, HTN, DVT who reports to [facility] today for a full physical. Pt states he has lost 10 pounds since his last visit. Pt has not had a recent coloscopy because his GI doctor did not want the perform the procedure because he is on blood thinners due to his DVT. Pt states he has intermittent pain behind his knee where he has the clot. He has not had the SOB that he had in the past at any time recently. Pt's next prostate check is in December.    Past Medical History  Diagnosis Date  . Hypertension   . Diabetes mellitus without complication   . DVT (deep venous thrombosis)   . Prostate cancer 12/06/13    gleason 4+3=7, 6/12 cores positive  . GERD (gastroesophageal reflux disease)   . Hypercholesterolemia    Past Surgical History  Procedure Laterality Date  . Appendectomy    . Spine surgery   1964 and 1967    Tourette'ssyndrome  . Prostate biopsy  12/06/13    Gleason 4+3=7, volume 35 gm  . Knee surgery      right knee orthoscopic   History   Social History  . Marital Status: Married    Spouse Name: N/A  . Number of Children: 1  . Years of Education: N/A   Occupational History  .     Social History Main Topics  . Smoking status: Former Smoker -- 0.50 packs/day for 23 years    Types: Cigarettes    Quit date: 08/01/2012  . Smokeless tobacco: Never Used  . Alcohol Use: No  . Drug Use: No  . Sexual Activity: Yes    Birth Control/ Protection: Abstinence   Other Topics Concern  . None   Social History Narrative   Family History  Problem Relation Age of Onset  . Heart disease Mother     before age 48  .  Diabetes Mother   . Hyperlipidemia Mother   . Varicose Veins Mother   . Heart disease Father   . Diabetes Father   . Cancer Neg Hx    No Known Allergies Prior to Admission medications   Medication Sig Start Date End Date Taking? Authorizing Provider  amLODipine (NORVASC) 2.5 MG tablet Take 1 tablet (2.5 mg total) by mouth daily. 01/11/15   Troy Sine, MD  atorvastatin (LIPITOR) 40 MG tablet TAKE ONE TABLET BY MOUTH ONCE DAILY. 11/13/14   Darlyne Russian, MD  blood glucose meter kit and supplies KIT Test blood sugar daily as directed. Dx code: E11.9 01/23/15   Darlyne Russian, MD  cholecalciferol (VITAMIN D) 1000 UNITS tablet Take 1,000 Units by mouth daily.    Historical Provider, MD  glipiZIDE (GLUCOTROL XL) 10 MG 24 hr tablet Take 1 tablet (10 mg total) by mouth daily with breakfast. 11/13/14   Darlyne Russian, MD  glucose blood test strip Check twice daily. 11/16/14   Elayne Snare, MD  losartan (COZAAR) 100 MG tablet TAKE ONE TABLET BY MOUTH ONCE DAILY 11/13/14   Remo Lipps  A Esbeidy Mclaine, MD  metFORMIN (GLUCOPHAGE) 1000 MG tablet Pt is to take 1 and half tabs per day 02/13/15   Elayne Snare, MD  Omega-3 Fatty Acids (FISH OIL) 500 MG CAPS Take by mouth.    Historical Provider, MD  rivaroxaban (XARELTO) 20 MG TABS tablet TAKE ONE TABLET BY MOUTH ONCE DAILY WITH  SUPPER. 11/13/14   Darlyne Russian, MD  TRULICITY 1.5 KT/6.2BW SOPN Inject contents of one(1) pen per week 02/13/15   Elayne Snare, MD  zoster vaccine live, PF, (ZOSTAVAX) 38937 UNT/0.65ML injection Inject 19,400 Units into the skin once. 05/03/14   Darlyne Russian, MD     ROS: The patient denies fevers, chills, night sweats, unintentional weight loss, chest pain, palpitations, wheezing, dyspnea on exertion, nausea, vomiting, abdominal pain, dysuria, hematuria, melena, numbness, weakness, or tingling.  All other systems have been reviewed and were otherwise negative with the exception of those mentioned in the HPI and as above.    PHYSICAL EXAM: Filed Vitals:     04/05/15 0928  BP: 133/71  Pulse: 62  Temp: 98.1 F (36.7 C)  Resp: 16   Body mass index is 41.61 kg/(m^2).   General: Alert, no acute distress HEENT:  Normocephalic, atraumatic, oropharynx patent. Eye: Juliette Mangle [facility] Cardiovascular:  Bilateral varicose veins with stasis dermitis changes. No calf tenderness. Heart slightly irregular.   Respiratory: Clear to auscultation bilaterally.  No wheezes, rales, or rhonchi.  No cyanosis, no use of accessory musculature Abdominal: No organomegaly, abdomen is soft and non-tender, positive bowel sounds.  No masses. Musculoskeletal: Gait intact. No edema, tenderness Skin: No rashes. Neurologic: Facial musculature symmetric. Psychiatric: Patient acts appropriately throughout our interaction. Lymphatic: No cervical or submandibular lymphadenopathy Genitourinary/Anorectal: No acute findings   LABS: Results for orders placed or performed in visit on 03/29/15  HM DIABETES EYE EXAM  Result Value Ref Range   HM Diabetic Eye Exam  No Retinopathy   Results for orders placed or performed in visit on 03/29/15  HM DIABETES EYE EXAM  Result Value Ref Range   HM Diabetic Eye Exam  No Retinopathy    EKG/XRAY:   Primary read interpreted by Dr. Everlene Farrier at [facility]. EKG shows sinus bradycardia T-wave changes anterolateral unchanged from previous with slight sinus irregularity   ASSESSMENT/PLAN: Patient is doing well. We'll check another ultrasound of the left leg to evaluate the status of his known clots. He has done well with his prostate cancer treatment. He is working hard to get his diabetes under control with Dr. Dwyane Dee.I personally performed the services described in this documentation, which was scribed in my presence. The recorded information has been reviewed and is accurate.  Nena Jordan, MD  Gross sideeffects, risk and benefits, and alternatives of medications d/w patient. Patient is aware that all medications have potential sideeffects and we are  unable to predict every sideeffect or drug-drug interaction that may occur.  [name] MD 04/05/2015 9:40 AM

## 2015-04-10 ENCOUNTER — Other Ambulatory Visit: Payer: Self-pay | Admitting: Physician Assistant

## 2015-04-10 ENCOUNTER — Telehealth: Payer: Self-pay

## 2015-04-10 ENCOUNTER — Telehealth: Payer: Self-pay | Admitting: *Deleted

## 2015-04-10 ENCOUNTER — Other Ambulatory Visit: Payer: Self-pay | Admitting: Emergency Medicine

## 2015-04-10 DIAGNOSIS — Z86718 Personal history of other venous thrombosis and embolism: Secondary | ICD-10-CM

## 2015-04-10 DIAGNOSIS — M6530 Trigger finger, unspecified finger: Secondary | ICD-10-CM

## 2015-04-10 NOTE — Telephone Encounter (Signed)
Patient called to inquire about referrals that were supposed to be set up after his OV on 04/05/15.  Patient was expecting a referral for an ultrasound for his blood clots.  He also needed a referral for his trigger finger.  It does not appear that they referrals were placed after his OV.  Please advise.  Thank you.

## 2015-04-10 NOTE — Telephone Encounter (Signed)
Hand refferal ordered

## 2015-04-10 NOTE — Telephone Encounter (Signed)
Can we order? 

## 2015-04-10 NOTE — Telephone Encounter (Signed)
Pt was scheduled tomorrow for his Venous doppler at [place] at [facility] for 1:15pm.  Pt was called and given time/place and was told to arrive at 1pm.  He understood.

## 2015-04-10 NOTE — Telephone Encounter (Signed)
Referral for left lower DVT scan is in.  I don't see anything in the Dr. Perfecto Kingdom note about trigger finger.  I think it is best he let me know so I may place the referral.  Philis Fendt, MS, PA-C   2:52 PM, 04/10/2015

## 2015-04-11 ENCOUNTER — Ambulatory Visit
Admission: RE | Admit: 2015-04-11 | Discharge: 2015-04-11 | Disposition: A | Discharge status: Home / Self Care | Payer: BLUE CROSS/BLUE SHIELD | Source: Ambulatory Visit | Attending: Emergency Medicine | Admitting: Emergency Medicine

## 2015-04-11 ENCOUNTER — Other Ambulatory Visit: Payer: Self-pay | Admitting: Emergency Medicine

## 2015-04-11 DIAGNOSIS — I82402 Acute embolism and thrombosis of unspecified deep veins of left lower extremity: Secondary | ICD-10-CM

## 2015-04-15 ENCOUNTER — Telehealth: Payer: Self-pay

## 2015-04-15 NOTE — Telephone Encounter (Signed)
Patient returning phone call for lab results.

## 2015-04-16 NOTE — Telephone Encounter (Signed)
Spoke with pt about doppler results. Pt understood and will follow up.

## 2015-04-24 NOTE — Telephone Encounter (Signed)
Error

## 2015-05-09 ENCOUNTER — Other Ambulatory Visit: Payer: BLUE CROSS/BLUE SHIELD

## 2015-05-16 ENCOUNTER — Ambulatory Visit: Payer: BLUE CROSS/BLUE SHIELD | Admitting: Endocrinology

## 2015-05-16 ENCOUNTER — Ambulatory Visit (INDEPENDENT_AMBULATORY_CARE_PROVIDER_SITE_OTHER): Payer: BLUE CROSS/BLUE SHIELD | Admitting: Endocrinology

## 2015-05-16 ENCOUNTER — Encounter: Payer: Self-pay | Admitting: Endocrinology

## 2015-05-16 VITALS — BP 128/88 | HR 58 | Temp 97.9°F | Resp 16 | Ht 71.0 in | Wt 295.2 lb

## 2015-05-16 DIAGNOSIS — Z23 Encounter for immunization: Secondary | ICD-10-CM

## 2015-05-16 DIAGNOSIS — E1165 Type 2 diabetes mellitus with hyperglycemia: Secondary | ICD-10-CM

## 2015-05-16 DIAGNOSIS — IMO0002 Reserved for concepts with insufficient information to code with codable children: Secondary | ICD-10-CM

## 2015-05-16 NOTE — Progress Notes (Signed)
Patient ID: Tommy Yang, male   DOB: Mar 15, 1951, 64 y.o.   MRN: [id number]           Reason for Appointment: Follow-up for Type 2 Diabetes  Referring physician: Daub  History of Present Illness:          Diagnosis: Type 2 diabetes mellitus, date of diagnosis: 05/2013       Past history:   He apparently had significant hyperglycemia in 2014 when seen in the urgent care center but did not establish with a PCP for control. He was started on treatment for his diabetes in 10/2013 and he was initially treated with metformin and Januvia A few months later he was also given glipizide ER to help his control when his A1c had gone up to 10.9 He says he was also seen by dietitian but no record available of this. Subsequently his blood sugar control had improved with A1c down to 6.8 in 9/15  Recent history:   On his consultation in 3/16 he was having high blood sugars and weight gain; A1c was 9.1% despite taking glipizide ER, Januvia and metformin.  With taking Trulicity instead of Januvia he has had progressive improvement in his glucose control On his last visit his metformin was increased to 1500 mg in the evening A1c is now down to 6.6% He has been tolerating Trulicity without any difficulty  Current blood sugar patterns and problems identified:  He has occasional increases in fasting blood sugars but they are relatively more stable now  He has sporadic high readings after breakfast and suppertime; some of his readings are still after eating cereal in the morning  However most of his high readings after supper were after a steroid injection  He has lost about 10 pounds since his last visit  He has been trying to walk in the mornings more regularly  overall mild increase in fasting blood sugars with average just over 150  Blood sugars are somewhat variable in the afternoons and quite variable around supper time  He is not checking blood sugars after supper despite instructions  to do so  He did not see the dietitian as instructed and 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  Still eating unbalanced meals sometimes like cereal in the morning, has no readings after breakfast  Although he is trying to walk regularly has not been able to lose weight recently  He says that if he has a high reading in the afternoon he will take an extra metformin sometimes and this will improve his blood sugar        Oral hypoglycemic drugs the patient is taking are: Glipizide ER 10 mg in am, metformin 1 g a.m. and 1.5 g p.m. Side effects from medications have been: None Compliance with the medical regimen: Good  Hypoglycemia: None recently    Glucose monitoring:  done 1-2 times a day         Glucometer: One Touch.      Blood Glucose readings by  Mean values apply above for all meters except median for One Touch  PRE-MEAL Fasting Lunch Dinner Bedtime Overall  Glucose range:  95-131       Mean/median: 133    131   POST-MEAL PC Breakfast PC Lunch PC Dinner  Glucose range: 144-222 115-210  85-315  Mean/median:        128     Self-care: The diet that the patient has been following is: tries to limit starches and  fats .     Meals: 3 meals per day. Breakfast is usually cereal, frequently having soup for lunch           Exercise: walking up to 1 mile, 5 days a week in the mornings         Dietician visit, most recent:?  2015 .               Weight history:    Wt Readings from Last 3 Encounters:  05/16/15 295 lb 3.2 oz (133.902 kg)  04/05/15 298 lb 3.2 oz (135.263 kg)  03/14/15 302 lb 3.2 oz (137.077 kg)    Glycemic control:   Lab Results  Component Value Date   HGBA1C 6.6 05/17/2015   HGBA1C 7.6* 02/08/2015   HGBA1C 9.1 10/31/2014   Lab Results  Component Value Date   MICROALBUR 9.9* 04/05/2015   LDLCALC 98 10/31/2014   CREATININE 0.90 02/08/2015         Medication List       This list is accurate as of: 05/16/15 11:59 PM.  Always use  your most recent med list.               amLODipine 2.5 MG tablet  Commonly known as:  NORVASC  Take 1 tablet (2.5 mg total) by mouth daily.     atorvastatin 40 MG tablet  Commonly known as:  LIPITOR  TAKE ONE TABLET BY MOUTH ONCE DAILY.     blood glucose meter kit and supplies Kit  Test blood sugar daily as directed. Dx code: E11.9     cholecalciferol 1000 UNITS tablet  Commonly known as:  VITAMIN D  Take 1,000 Units by mouth daily.     Cinnamon 500 MG capsule  Take 500 mg by mouth daily.     Fish Oil 500 MG Caps  Take by mouth.     glipiZIDE 10 MG 24 hr tablet  Commonly known as:  GLUCOTROL XL  Take 1 tablet (10 mg total) by mouth daily with breakfast.     glucose blood test strip  Check twice daily.     losartan 100 MG tablet  Commonly known as:  COZAAR  TAKE ONE TABLET BY MOUTH ONCE DAILY     metFORMIN 1000 MG tablet  Commonly known as:  GLUCOPHAGE  Pt is to take 1 and half tabs per day     rivaroxaban 20 MG Tabs tablet  Commonly known as:  XARELTO  TAKE ONE TABLET BY MOUTH ONCE DAILY WITH  SUPPER.     TRULICITY 1.5 MC/9.4BS Sopn  Generic drug:  Dulaglutide  Inject contents of one(1) pen per week     zoster vaccine live (PF) 19400 UNT/0.65ML injection  Commonly known as:  ZOSTAVAX  Inject 19,400 Units into the skin once.        Allergies: No Known Allergies  Past Medical History  Diagnosis Date  . Hypertension   . Diabetes mellitus without complication   . DVT (deep venous thrombosis)   . Prostate cancer 12/06/13    gleason 4+3=7, 6/12 cores positive  . GERD (gastroesophageal reflux disease)   . Hypercholesterolemia     Past Surgical History  Procedure Laterality Date  . Appendectomy    . Spine surgery   1964 and 1967    Tourette'ssyndrome  . Prostate biopsy  12/06/13    Gleason 4+3=7, volume 35 gm  . Knee surgery      right knee orthoscopic    Family History  Problem  Relation Age of Onset  . Heart disease Mother     before age 37    . Diabetes Mother   . Hyperlipidemia Mother   . Varicose Veins Mother   . Heart disease Father   . Diabetes Father   . Cancer Neg Hx     Social History:  reports that he quit smoking about 2 years ago. His smoking use included Cigarettes. He has a 11.5 pack-year smoking history. He has never used smokeless tobacco. He reports that he does not drink alcohol or use illicit drugs.    Review of Systems   Most recent eye exam was ?  2 years ago       Lipids: he has had significant hyperlipidemia and has been treated with Lipitor 40 mg daily; no CAD and recently had negative stress test        Lab Results  Component Value Date   CHOL 170 02/08/2015   HDL 35.40* 02/08/2015   LDLCALC 98 10/31/2014   LDLDIRECT 103.0 02/08/2015   TRIG 210.0* 02/08/2015   CHOLHDL 5 02/08/2015                   The blood pressure has been treated with losartan 100 mg and low-dose amlodipine, followed by PCP   LABS:  No visits with results within 1 Week(s) from this visit. Latest known visit with results is:  Office Visit on 04/05/2015  Component Date Value Ref Range Status  . Microalb, Ur 04/05/2015 9.9* <2.0 mg/dL Final   Comment: The ADA (Diabetes Care 0923;30(QTMAU 1):S14-S80) has defined abnormalities in albumin excretion as follows:            Category           Result                            (mg/g creatinine)                 Normal:    <30       Microalbuminuria:    30 - 299   Clinical albuminuria:    > or = 300    The ADA recommends that at least two of three specimens collected within a 3 - 6 month period be abnormal before considering a patient to be within a diagnostic category.     Physical Examination:  BP 128/88 mmHg  Pulse 58  Temp(Src) 97.9 F (36.6 C)  Resp 16  Ht '5\' 11"'  (1.803 m)  Wt 295 lb 3.2 oz (133.902 kg)  BMI 41.19 kg/m2  SpO2 95%          ASSESSMENT:   Diabetes type 2, uncontrolled   See history of present illness for detailed discussion of his  current management, blood sugar patterns and problems identified    Currently on a regimen of 6.3FH Trulicity along with glipizide ER 10 mg daily and metformin 2.5 g daily. He has had improved control with being consistent with diet and exercise Also has benefited from increased metformin dose He has excellent postprandial readings after evening meal with only occasional high readings in the morning after eating breakfast with no protein   PLAN:  Continue current regimen If he has another steroid injection and he may need to increase his glipizide to 20 mg temporarily. However if he starts having blood sugar below 80 he will need to call to reduce his glipizide ER to 5 mg  Influenza vaccine given  [facility] 05/17/2015, 12:47 PM   Note: This office note was prepared with Dragon voice recognition system technology. Any transcriptional errors that result from this process are unintentional.

## 2015-05-16 NOTE — Patient Instructions (Addendum)
May take 2 Glipizide on days of cortisone shot  If sugar starts getting <80 call, need to reduce Glipizide to 5 mg  Check blood sugars on waking up .[name] 2-3 .[name] times a week Also check blood sugars about 2 hours after a meal and do this after different meals by rotation  Recommended blood sugar levels on waking up is 90-130 and about 2 hours after meal is 140-180 Please bring blood sugar monitor to each visit.

## 2015-05-17 ENCOUNTER — Other Ambulatory Visit: Payer: Self-pay | Admitting: *Deleted

## 2015-05-17 DIAGNOSIS — E1165 Type 2 diabetes mellitus with hyperglycemia: Secondary | ICD-10-CM

## 2015-05-17 DIAGNOSIS — IMO0002 Reserved for concepts with insufficient information to code with codable children: Secondary | ICD-10-CM

## 2015-05-17 LAB — POCT GLYCOSYLATED HEMOGLOBIN (HGB A1C): Hemoglobin A1C: 6.6

## 2015-05-27 ENCOUNTER — Ambulatory Visit (HOSPITAL_BASED_OUTPATIENT_CLINIC_OR_DEPARTMENT_OTHER): Payer: BLUE CROSS/BLUE SHIELD

## 2015-05-28 ENCOUNTER — Telehealth: Payer: Self-pay

## 2015-05-28 MED ORDER — GLUCOSE BLOOD VI STRP: ORAL_STRIP | Status: DC

## 2015-05-28 MED ORDER — GLUCOSE BLOOD VI STRP
ORAL_STRIP | Status: DC
Start: 2015-05-28 — End: 2015-05-28

## 2015-05-28 NOTE — Telephone Encounter (Signed)
Spoke with pt, he needed a Rx sent to [facility] for test strips because the pharmacy said he could get 100 for free. I sent in Rx for pt.

## 2015-05-28 NOTE — Telephone Encounter (Signed)
Left message for pt to call back  °

## 2015-05-28 NOTE — Addendum Note (Signed)
Addended by: Jannette Spanner on: 05/28/2015 03:57 PM   Modules accepted: Orders

## 2015-05-28 NOTE — Telephone Encounter (Signed)
Pt would like to have Dr Perfecto Kingdom nurse give him a call he didn't want to say why. Please call [phone]

## 2015-06-05 ENCOUNTER — Encounter: Payer: Self-pay | Admitting: Emergency Medicine

## 2015-06-05 ENCOUNTER — Ambulatory Visit: Payer: BLUE CROSS/BLUE SHIELD | Admitting: Endocrinology

## 2015-07-06 ENCOUNTER — Telehealth: Payer: Self-pay

## 2015-07-06 DIAGNOSIS — I82512 Chronic embolism and thrombosis of left femoral vein: Secondary | ICD-10-CM

## 2015-07-06 NOTE — Telephone Encounter (Signed)
Left message for pt to call back  °

## 2015-07-06 NOTE — Telephone Encounter (Signed)
I ordered the [name] for the patient to look for resolution/regression of DVT I will send to Dr Everlene Farrier when I get the results.

## 2015-07-06 NOTE — Telephone Encounter (Signed)
Spoke with pt, he has met his deductible and he would like a doppler scheduled before the year is up. He also stated he has blood clots in his stools but it does not happen all the time. It comes and goes. He has history of cancer and takes Xarelto.  I advised him to come in to be evaluated. He wants to wait until Dr. Everlene Farrier comes back next week. I advised against that but he refused because he only trusts Dr. Everlene Farrier.  I advised to go to ER if he is having dizziness, pain, etc. Pt agreed.  [facility] [id number] (pt wants doppler done)  Is there anyway we can squeeze pt in to see Dr. Everlene Farrier sooner?

## 2015-07-06 NOTE — Telephone Encounter (Signed)
Pt.notified

## 2015-07-06 NOTE — Telephone Encounter (Signed)
Pt wants to have a CB concerning his blood clots. I advised him to come in for an office visit. He did not want to. Please advise at [phone]

## 2015-07-09 ENCOUNTER — Telehealth: Payer: Self-pay

## 2015-07-09 NOTE — Telephone Encounter (Signed)
Pt would like Dr. Everlene Farrier to call him concerning these intermittent blood clots that he has been having in his stools. He's concerned about it and wants to know should he be referred to a specialist. He has an appt scheduled for 08/30/15.

## 2015-07-10 ENCOUNTER — Ambulatory Visit (HOSPITAL_COMMUNITY)
Admission: RE | Admit: 2015-07-10 | Discharge: 2015-07-10 | Disposition: A | Discharge status: Home / Self Care | Payer: BLUE CROSS/BLUE SHIELD | Source: Ambulatory Visit | Attending: Physician Assistant | Admitting: Physician Assistant

## 2015-07-10 ENCOUNTER — Other Ambulatory Visit: Payer: Self-pay | Admitting: Emergency Medicine

## 2015-07-10 DIAGNOSIS — E119 Type 2 diabetes mellitus without complications: Secondary | ICD-10-CM | POA: Diagnosis not present

## 2015-07-10 DIAGNOSIS — I82512 Chronic embolism and thrombosis of left femoral vein: Secondary | ICD-10-CM

## 2015-07-10 DIAGNOSIS — I10 Essential (primary) hypertension: Secondary | ICD-10-CM | POA: Insufficient documentation

## 2015-07-10 DIAGNOSIS — E7800 Pure hypercholesterolemia, unspecified: Secondary | ICD-10-CM | POA: Insufficient documentation

## 2015-07-10 DIAGNOSIS — K625 Hemorrhage of anus and rectum: Secondary | ICD-10-CM

## 2015-07-10 NOTE — Telephone Encounter (Signed)
I called and spoke with patient. He was unable to have his colonoscopy due to being on blood thinner. He did not send in his cologuard because it was not covered by insurance. I will see the patient this weekend with plans that we will proceed with a barium enema as well as probable referral to GI.

## 2015-07-10 NOTE — Telephone Encounter (Signed)
See previous message on pt Dr. Everlene Farrier.

## 2015-07-10 NOTE — Progress Notes (Signed)
*  Preliminary Results* Left lower extremity venous duplex completed. Left lower extremity is positive for age indeterminate deep vein thrombosis involving the left femoral and popliteal veins. There is no evidence of left Baker's cyst.  Preliminary results discussed with Dr. Everlene Farrier.  07/10/2015 10:56 AM  Maudry Mayhew, RVT, RDCS, RDMS

## 2015-07-11 ENCOUNTER — Telehealth: Payer: Self-pay | Admitting: Emergency Medicine

## 2015-07-11 ENCOUNTER — Other Ambulatory Visit: Payer: Self-pay | Admitting: Emergency Medicine

## 2015-07-11 DIAGNOSIS — K625 Hemorrhage of anus and rectum: Secondary | ICD-10-CM

## 2015-07-11 NOTE — Telephone Encounter (Signed)
Call patient and let him know as we discussed yesterday I am trying to get him scheduled for a barium enema. I also would still like to see him this weekend.

## 2015-07-12 NOTE — Telephone Encounter (Signed)
Spoke with pt, advised message. Pt understood. 

## 2015-07-12 NOTE — Telephone Encounter (Signed)
[facility] left a message stating that they will need labwork for the patient prior to his scheduled appointment.  Since he is planning to be seen this weekend, it would be beneficial if he could get labwork done during his OV.

## 2015-07-18 ENCOUNTER — Encounter: Payer: Self-pay | Admitting: Gastroenterology

## 2015-07-18 ENCOUNTER — Telehealth: Payer: Self-pay | Admitting: *Deleted

## 2015-07-18 ENCOUNTER — Ambulatory Visit (INDEPENDENT_AMBULATORY_CARE_PROVIDER_SITE_OTHER): Payer: BLUE CROSS/BLUE SHIELD | Admitting: Gastroenterology

## 2015-07-18 VITALS — BP 120/70 | HR 67 | Ht 71.0 in | Wt 301.2 lb

## 2015-07-18 DIAGNOSIS — Z7901 Long term (current) use of anticoagulants: Secondary | ICD-10-CM

## 2015-07-18 DIAGNOSIS — R1310 Dysphagia, unspecified: Secondary | ICD-10-CM

## 2015-07-18 DIAGNOSIS — K625 Hemorrhage of anus and rectum: Secondary | ICD-10-CM

## 2015-07-18 MED ORDER — NA SULFATE-K SULFATE-MG SULF 17.5-3.13-1.6 GM/177ML PO SOLN: 1.0000 | Freq: Once | ORAL | Status: AC

## 2015-07-18 NOTE — Telephone Encounter (Signed)
I appreciate you seeing Tommy Yang. I have reviewed his recent Doppler. He can stop his Xarelto total for 48 hours for his procedure. Please follow your protocol. I do agree with stopping the Xarelto for the procedure. Thank you

## 2015-07-18 NOTE — Telephone Encounter (Signed)
07/18/2015   RE: Tommy Yang DOB: 06-Oct-1950 MRN: [id number]   Dear  Dr. [name],    We have scheduled the above patient for an endoscopic procedure. Our records show that he is on anticoagulation therapy.   Please advise as to how long the patient may come off his therapy of Xarelto prior to the procedure, which is scheduled for 08-02-2015.  Please fax back/ or route the completed form to [facility] at [phone].   Sincerely,    Alonza Bogus PA-C

## 2015-07-18 NOTE — Progress Notes (Signed)
07/18/2015 Tommy Yang [id number] 1950/09/16   History of Present Illness:   This is a pleasant 64 year old male who I saw for the first time in September 2015. 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 At the time of his last visit he was being seen for screening colonoscopy as well as some complaints of long-standing reflux and dysphagia. We were going to plan for EGD and colonoscopy at that time, however, at the recommendations of his PCP we did not proceed since he still had residual clot in his leg.  He presents for office today again at the request of his PCP, Dr. Everlene Farrier, because of recent issues with rectal bleeding. Since the patient was seen here last he has also been treated for prostate cancer with 40 treatments of radiation. He states that back in March around the time of his 40th treatment he started having some rectal bleeding described as red blood "filling the toilet bowl".  More recently he has been seeing blood again with clots as well.  I spoke with Dr. Everlene Farrier and he would like [name] to proceed with colonoscopy and will give the patient clearance to hold his Xarelto. Patient does still have residual clot in his leg, which is chronic at this point , but has not been progressive.  He continues to complain of intermittent dysphagia saying that sometimes when he eats or drinks he feels like it gets hung up and causes pressure in his chest that is relieved by belching. I had placed him on omeprazole at his last visit, but he is no longer taking that medication.   Current Medications, Allergies, Past Medical History, Past Surgical History, Family History and Social History were reviewed in [employer] record.   Physical Exam: BP 120/70 mmHg  Pulse 67  Ht 5\' 11"  (1.803 m)  Wt 301 lb 3.2 oz (136.623 kg)   BMI 42.03 kg/m2 General: Well developed white male in no acute distress Head: Normocephalic and atraumatic Eyes:  Sclerae anicteric, conjunctiva pink  Ears: Normal auditory acuity Lungs: Clear throughout to auscultation Heart: Regular rate and rhythm Abdomen: Soft, non-distended.  Normal bowel sounds.  Non-tender. Rectal:  Will be done at the time of colonoscopy. Musculoskeletal: Symmetrical with no gross deformities  Extremities: No edema  Neurological: Alert oriented x 4, grossly non-focal Psychological:  Alert and cooperative. 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   Previously we had decided not to proceed with colonoscopy for screening purposes due to his recent lower extremity blood clot, which is still present and chronic at this point. He is on Xarelto and will be on that indefinitely. I discussed with Dr. Everlene Farrier who is asking [name] to proceed with colonoscopy now due to the bleeding and is giving [name] clearance to hold Xarelto prior to procedure. -GERD and dysphagia:  Will schedule for EGD with possible dilation as well.  Rule out GERD/esophagitis vs stricture vs Schatzki's ring, etc.  Will start omeprazole 40 mg daily in the interim. -Chronic anticoagulation with Xarelto due to LE DVT.  The risks, benefits, and alternatives to colonoscopy and EGD were discussed with the patient and he consents to proceed.  Will plan for procedures at [place] where [facility] can  be performed if needed.

## 2015-07-18 NOTE — Patient Instructions (Signed)
You have been scheduled for an endoscopy and colonoscopy. Please follow the written instructions given to you at your visit today. Please pick up your prep supplies at the pharmacy within the next 1-3 days. [employer], [address],  If you use inhalers (even only as needed), please bring them with you on the day of your procedure.

## 2015-07-19 ENCOUNTER — Telehealth: Payer: Self-pay | Admitting: *Deleted

## 2015-07-19 ENCOUNTER — Encounter (HOSPITAL_COMMUNITY): Payer: Self-pay | Admitting: *Deleted

## 2015-07-19 NOTE — Progress Notes (Signed)
i agree with the above note, plan 

## 2015-07-19 NOTE — Telephone Encounter (Signed)
Pe Dr. Everlene Farrier, advised the patient to hold the Xarelto 11-30-and 12-1 and he can resume it on 08-03-2015.  The patient verbalzied understanding the instructions.

## 2015-08-01 ENCOUNTER — Telehealth: Payer: Self-pay

## 2015-08-01 NOTE — Telephone Encounter (Signed)
Pt needs to talk with Dr. Everlene Farrier. He is having surgery tomorrow 12/1. This morning his sugar level was 123 at 7am, now it's 84. He just drank his barium solution and is afraid of what it might do to his sugar level. Please advise at [phone]

## 2015-08-02 ENCOUNTER — Ambulatory Visit (HOSPITAL_COMMUNITY): Payer: BLUE CROSS/BLUE SHIELD | Admitting: Anesthesiology

## 2015-08-02 ENCOUNTER — Encounter (HOSPITAL_COMMUNITY): Payer: Self-pay | Admitting: *Deleted

## 2015-08-02 ENCOUNTER — Telehealth: Payer: Self-pay

## 2015-08-02 ENCOUNTER — Ambulatory Visit (HOSPITAL_COMMUNITY)
Admission: RE | Admit: 2015-08-02 | Discharge: 2015-08-02 | Disposition: A | Discharge status: Home / Self Care | Payer: BLUE CROSS/BLUE SHIELD | Source: Ambulatory Visit | Attending: Gastroenterology | Admitting: Gastroenterology

## 2015-08-02 ENCOUNTER — Encounter (HOSPITAL_COMMUNITY)
Admission: RE | Disposition: A | Discharge status: Home / Self Care | Payer: Self-pay | Source: Ambulatory Visit | Attending: Gastroenterology

## 2015-08-02 DIAGNOSIS — Z87891 Personal history of nicotine dependence: Secondary | ICD-10-CM | POA: Insufficient documentation

## 2015-08-02 DIAGNOSIS — R0602 Shortness of breath: Secondary | ICD-10-CM | POA: Diagnosis not present

## 2015-08-02 DIAGNOSIS — K449 Diaphragmatic hernia without obstruction or gangrene: Secondary | ICD-10-CM | POA: Insufficient documentation

## 2015-08-02 DIAGNOSIS — I739 Peripheral vascular disease, unspecified: Secondary | ICD-10-CM | POA: Diagnosis not present

## 2015-08-02 DIAGNOSIS — E119 Type 2 diabetes mellitus without complications: Secondary | ICD-10-CM | POA: Diagnosis not present

## 2015-08-02 DIAGNOSIS — K208 Other esophagitis: Secondary | ICD-10-CM | POA: Insufficient documentation

## 2015-08-02 DIAGNOSIS — K644 Residual hemorrhoidal skin tags: Secondary | ICD-10-CM | POA: Insufficient documentation

## 2015-08-02 DIAGNOSIS — K219 Gastro-esophageal reflux disease without esophagitis: Secondary | ICD-10-CM | POA: Insufficient documentation

## 2015-08-02 DIAGNOSIS — K2270 Barrett's esophagus without dysplasia: Secondary | ICD-10-CM | POA: Diagnosis not present

## 2015-08-02 DIAGNOSIS — Q2733 Arteriovenous malformation of digestive system vessel: Secondary | ICD-10-CM

## 2015-08-02 DIAGNOSIS — K5730 Diverticulosis of large intestine without perforation or abscess without bleeding: Secondary | ICD-10-CM | POA: Insufficient documentation

## 2015-08-02 DIAGNOSIS — Z7901 Long term (current) use of anticoagulants: Secondary | ICD-10-CM | POA: Insufficient documentation

## 2015-08-02 DIAGNOSIS — K648 Other hemorrhoids: Secondary | ICD-10-CM | POA: Diagnosis not present

## 2015-08-02 DIAGNOSIS — D125 Benign neoplasm of sigmoid colon: Secondary | ICD-10-CM | POA: Insufficient documentation

## 2015-08-02 DIAGNOSIS — K627 Radiation proctitis: Secondary | ICD-10-CM | POA: Diagnosis not present

## 2015-08-02 DIAGNOSIS — G4733 Obstructive sleep apnea (adult) (pediatric): Secondary | ICD-10-CM | POA: Insufficient documentation

## 2015-08-02 DIAGNOSIS — K5520 Angiodysplasia of colon without hemorrhage: Secondary | ICD-10-CM | POA: Insufficient documentation

## 2015-08-02 DIAGNOSIS — K210 Gastro-esophageal reflux disease with esophagitis: Secondary | ICD-10-CM | POA: Diagnosis not present

## 2015-08-02 DIAGNOSIS — R1310 Dysphagia, unspecified: Secondary | ICD-10-CM | POA: Diagnosis not present

## 2015-08-02 DIAGNOSIS — E785 Hyperlipidemia, unspecified: Secondary | ICD-10-CM | POA: Diagnosis not present

## 2015-08-02 DIAGNOSIS — I82509 Chronic embolism and thrombosis of unspecified deep veins of unspecified lower extremity: Secondary | ICD-10-CM | POA: Insufficient documentation

## 2015-08-02 DIAGNOSIS — Z923 Personal history of irradiation: Secondary | ICD-10-CM | POA: Insufficient documentation

## 2015-08-02 DIAGNOSIS — I10 Essential (primary) hypertension: Secondary | ICD-10-CM | POA: Insufficient documentation

## 2015-08-02 DIAGNOSIS — Z6841 Body Mass Index (BMI) 40.0 and over, adult: Secondary | ICD-10-CM | POA: Insufficient documentation

## 2015-08-02 DIAGNOSIS — C61 Malignant neoplasm of prostate: Secondary | ICD-10-CM | POA: Insufficient documentation

## 2015-08-02 DIAGNOSIS — K625 Hemorrhage of anus and rectum: Secondary | ICD-10-CM

## 2015-08-02 HISTORY — PX: COLONOSCOPY WITH PROPOFOL: SHX5780

## 2015-08-02 HISTORY — PX: ESOPHAGOGASTRODUODENOSCOPY (EGD) WITH PROPOFOL: SHX5813

## 2015-08-02 HISTORY — PX: BALLOON DILATION: SHX5330

## 2015-08-02 LAB — GLUCOSE, CAPILLARY: Glucose-Capillary: 108 mg/dL — ABNORMAL HIGH (ref 65–99)

## 2015-08-02 SURGERY — COLONOSCOPY WITH PROPOFOL
Anesthesia: Monitor Anesthesia Care

## 2015-08-02 MED ORDER — PROPOFOL 10 MG/ML IV BOLUS
INTRAVENOUS | Status: AC
  Filled 2015-08-02: qty 20

## 2015-08-02 MED ORDER — LACTATED RINGERS IV SOLN
INTRAVENOUS | Status: DC | PRN
  Administered 2015-08-02: 09:00:00 via INTRAVENOUS

## 2015-08-02 MED ORDER — PROPOFOL 10 MG/ML IV BOLUS
INTRAVENOUS | Status: AC
Start: 2015-08-02 — End: 2015-08-02
  Filled 2015-08-02: qty 20

## 2015-08-02 MED ORDER — OMEPRAZOLE 40 MG PO CPDR: 40.0000 mg | DELAYED_RELEASE_CAPSULE | Freq: Two times a day (BID) | ORAL | Status: DC

## 2015-08-02 MED ORDER — LACTATED RINGERS IV SOLN
Freq: Once | INTRAVENOUS | Status: AC
  Administered 2015-08-02: 1000 mL via INTRAVENOUS

## 2015-08-02 MED ORDER — SODIUM CHLORIDE 0.9 % IV SOLN: INTRAVENOUS | Status: DC

## 2015-08-02 MED ORDER — PROPOFOL 10 MG/ML IV BOLUS
INTRAVENOUS | Status: DC | PRN
  Administered 2015-08-02 (×2): 40 mg via INTRAVENOUS

## 2015-08-02 MED ORDER — PROPOFOL 10 MG/ML IV BOLUS
INTRAVENOUS | Status: AC
  Filled 2015-08-02: qty 60

## 2015-08-02 MED ORDER — PROPOFOL 500 MG/50ML IV EMUL
INTRAVENOUS | Status: DC | PRN
  Administered 2015-08-02: 125 ug/kg/min via INTRAVENOUS

## 2015-08-02 SURGICAL SUPPLY — 25 items

## 2015-08-02 NOTE — Discharge Instructions (Signed)

## 2015-08-02 NOTE — Interval H&P Note (Signed)
History and Physical Interval Note:  08/02/2015 8:57 AM  Tommy Yang  has presented today for surgery, with the diagnosis of Rectal bleeding, dysphagia  The various methods of treatment have been discussed with the patient and family. After consideration of risks, benefits and other options for treatment, the patient has consented to  Procedure(s): COLONOSCOPY WITH PROPOFOL w/ APC (N/A) ESOPHAGOGASTRODUODENOSCOPY (EGD) WITH PROPOFOL/ possible dilation. (N/A) BALLOON DILATION (N/A) as a surgical intervention .  The patient's history has been reviewed, patient examined, no change in status, stable for surgery.  I have reviewed the patient's chart and labs.  Questions were answered to the patient's satisfaction.     [name]

## 2015-08-02 NOTE — Transfer of Care (Signed)
Immediate Anesthesia Transfer of Care Note  Patient: Tommy Yang  Procedure(s) Performed: Procedure(s): COLONOSCOPY WITH PROPOFOL w/ APC (N/A) ESOPHAGOGASTRODUODENOSCOPY (EGD) WITH PROPOFOL/ possible dilation. (N/A) BALLOON DILATION (N/A)  Patient Location: PACU and Endoscopy Unit  Anesthesia Type:MAC  Level of Consciousness: sedated and patient cooperative  Airway & Oxygen Therapy: Patient Spontanous Breathing and Patient connected to nasal cannula oxygen  Post-op Assessment: Report given to RN and Post -op Vital signs reviewed and stable  Post vital signs: Reviewed and stable  Last Vitals:  Filed Vitals:   08/02/15 0846  BP: 175/71  Temp: 36.9 C  Resp: 12    Complications: No apparent anesthesia complications

## 2015-08-02 NOTE — Telephone Encounter (Signed)
Recall entered in epic.

## 2015-08-02 NOTE — Op Note (Signed)
[facility] [place], [address]   COLONOSCOPY PROCEDURE REPORT  PATIENT: [name], [name]  MR#: [id number] BIRTHDATE: 1951-07-07 , 19  yrs. old GENDER: male ENDOSCOPIST: [name], MD REFERRED [id number] Everlene Farrier, M.D. PROCEDURE DATE:  08/02/2015 PROCEDURE:   Colonoscopy, screening, Colonoscopy, diagnostic, and Colonoscopy with snare polypectomy, colonoscopy with APC First Screening Colonoscopy - Avg.  risk and is 50 yrs.  old or older - No.  Prior Negative Screening - Now for repeat screening. N/A  History of Adenoma - Now for follow-up colonoscopy & has been > or = to 3 yrs.  N/A  Polyps removed today? Yes ASA CLASS:   Class III INDICATIONS:rectal bleeding since prostate radiation. MEDICATIONS: Monitored anesthesia care  DESCRIPTION OF PROCEDURE:   After the risks benefits and alternatives of the procedure were thoroughly explained, informed consent was obtained.  The digital rectal exam revealed no abnormalities of the rectum.   The Pentax Ped Colon [id number] endoscope was introduced through the anus and advanced to the cecum, which was identified by both the appendix and ileocecal valve. No adverse events experienced.   The quality of the prep was good.  The instrument was then slowly withdrawn as the colon was fully examined. Estimated blood loss is zero unless otherwise noted in this procedure report.  COLON FINDINGS: One polyp was found, removed and sent to pathology. This was in the sigmoid segment, semipedunculated, measured 64mm across, removed with snare/cautery (path jar 1).  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  There were small internal and external hemorrhoids.  The examination was otherwise normal. Retroflexed views revealed no abnormalities. The time to cecum = 5 min Withdrawal time =  15 min   The scope was withdrawn and the procedure completed. COMPLICATIONS: There were no immediate complications.  ENDOSCOPIC IMPRESSION: One polyp was found, removed and sent to pathology.  This was in the sigmoid segment, semipedunculated, measured 29mm across, removed with snare/cautery (path jar 1).  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  There were small internal and external hemorrhoids.  The examination was otherwise normal  RECOMMENDATIONS: 1. If the polyp(s) removed today are proven to be adenomatous (pre-cancerous) polyps, you will need a repeat colonoscopy in 5 years.  Otherwise you should continue to follow colorectal cancer screening guidelines for "routine risk" patients with colonoscopy in 10 years.  You will receive a letter within 1-2 weeks with the results of your biopsy as well as final recommendations.  Please call my office if you have not received a letter after 3 weeks. 2. Ok to resume your pradaxa in 2 days.  eSigned:  [name], MD 08/02/2015 9:54 AM     PATIENT NAME:  Tommy Yang, Tommy Yang MR#: [id number]

## 2015-08-02 NOTE — Telephone Encounter (Signed)
-----   Message from [name], MD sent at 08/02/2015 10:08 AM EST ----- He needs EGD at [facility] in 2 months for barrett's esophagus.  OK to stay on his blood thinner for the procedure.  Thanks

## 2015-08-02 NOTE — Op Note (Signed)
[facility] [address] [place], [address]   ENDOSCOPY PROCEDURE REPORT  PATIENT: Tommy Yang, Tommy Yang  MR#: [id number] BIRTHDATE: 04-May-1951 , 48  yrs. old GENDER: male ENDOSCOPIST: [name], MD PROCEDURE DATE:  08/02/2015 PROCEDURE:  EGD, diagnostic ASA CLASS:     Class III INDICATIONS:  intermittent GERD, intermittent dysphagia. MEDICATIONS: Monitored anesthesia care TOPICAL ANESTHETIC: none  DESCRIPTION OF PROCEDURE: After the risks benefits and alternatives of the procedure were thoroughly explained, informed consent was obtained.  The [place] [id number] endoscope was introduced through the mouth and advanced to the second portion of the duodenum , Without limitations.  The instrument was slowly withdrawn as the mucosa was fully examined.  There was non-nodular Barrett's appearing mucosa as well as severe, erosive esophagitis (LA grade C) emanating from the GE junction. There was a 2cm hiatal hernia.  NO esophageal stenosis or stricture.  The examination was otherwise normal.  Retroflexed views revealed no abnormalities.    The scope was then withdrawn from the patient and the procedure completed. COMPLICATIONS: There were no immediate complications.  ENDOSCOPIC IMPRESSION: There was non-nodular Barrett's appearing mucosa as well as severe, erosive esophagitis (LA grade C) emanating from the GE junction. There was a 2cm hiatal hernia.  NO esophageal stenosis or stricture.  The examination was otherwise normal  RECOMMENDATIONS: Will start twice daily PPI (omprazole 40mg  pill, best taken 20-30 min prior to BF and dinner meals) and my office will contact him for repeat EGD ([place]) in 2 months to check for reflux related esophagitis healing and biopsy of Barrett's appearing mucosa.   eSigned:  [name], MD 08/02/2015 10:06 AM    CC: Nena Jordan, MD

## 2015-08-02 NOTE — Anesthesia Preprocedure Evaluation (Signed)
Anesthesia Evaluation  Patient identified by MRN, date of birth, ID band Patient awake    Reviewed: Allergy & Precautions, NPO status , Patient's Chart, lab work & pertinent test results  Airway Mallampati: II   Neck ROM: full    Dental   Pulmonary shortness of breath, sleep apnea , former smoker,    breath sounds clear to auscultation       Cardiovascular hypertension, + Peripheral Vascular Disease   Rhythm:regular Rate:Normal     Neuro/Psych    GI/Hepatic GERD  ,  Endo/Other  diabetes, Type 2Morbid obesity  Renal/GU      Musculoskeletal   Abdominal   Peds  Hematology   Anesthesia Other Findings   Reproductive/Obstetrics                             Anesthesia Physical Anesthesia Plan  ASA: II  Anesthesia Plan: MAC   Post-op Pain Management:    Induction: Intravenous  Airway Management Planned: Nasal Cannula  Additional Equipment:   Intra-op Plan:   Post-operative Plan:   Informed Consent: I have reviewed the patients History and Physical, chart, labs and discussed the procedure including the risks, benefits and alternatives for the proposed anesthesia with the patient or authorized representative who has indicated his/her understanding and acceptance.     Plan Discussed with: CRNA, Anesthesiologist and Surgeon  Anesthesia Plan Comments:         Anesthesia Quick Evaluation

## 2015-08-02 NOTE — H&P (View-Only) (Signed)
07/18/2015 Tommy Yang [id number] 08/05/1951   History of Present Illness:   This is a pleasant 64 year old male who I saw for the first time in September 2015. 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 At the time of his last visit he was being seen for screening colonoscopy as well as some complaints of long-standing reflux and dysphagia. We were going to plan for EGD and colonoscopy at that time, however, at the recommendations of his PCP we did not proceed since he still had residual clot in his leg.  He presents for office today again at the request of his PCP, Dr. Everlene Farrier, because of recent issues with rectal bleeding. Since the patient was seen here last he has also been treated for prostate cancer with 40 treatments of radiation. He states that back in March around the time of his 40th treatment he started having some rectal bleeding described as red blood "filling the toilet bowl".  More recently he has been seeing blood again with clots as well.  I spoke with Dr. Everlene Farrier and he would like [name] to proceed with colonoscopy and will give the patient clearance to hold his Xarelto. Patient does still have residual clot in his leg, which is chronic at this point , but has not been progressive.  He continues to complain of intermittent dysphagia saying that sometimes when he eats or drinks he feels like it gets hung up and causes pressure in his chest that is relieved by belching. I had placed him on omeprazole at his last visit, but he is no longer taking that medication.   Current Medications, Allergies, Past Medical History, Past Surgical History, Family History and Social History were reviewed in [employer] record.   Physical Exam: BP 120/70 mmHg  Pulse 67  Ht 5\' 11"  (1.803 m)  Wt 301 lb 3.2 oz (136.623 kg)   BMI 42.03 kg/m2 General: Well developed white male in no acute distress Head: Normocephalic and atraumatic Eyes:  Sclerae anicteric, conjunctiva pink  Ears: Normal auditory acuity Lungs: Clear throughout to auscultation Heart: Regular rate and rhythm Abdomen: Soft, non-distended.  Normal bowel sounds.  Non-tender. Rectal:  Will be done at the time of colonoscopy. Musculoskeletal: Symmetrical with no gross deformities  Extremities: No edema  Neurological: Alert oriented x 4, grossly non-focal Psychological:  Alert and cooperative. 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   Previously we had decided not to proceed with colonoscopy for screening purposes due to his recent lower extremity blood clot, which is still present and chronic at this point. He is on Xarelto and will be on that indefinitely. I discussed with Dr. Everlene Farrier who is asking [name] to proceed with colonoscopy now due to the bleeding and is giving [name] clearance to hold Xarelto prior to procedure. -GERD and dysphagia:  Will schedule for EGD with possible dilation as well.  Rule out GERD/esophagitis vs stricture vs Schatzki's ring, etc.  Will start omeprazole 40 mg daily in the interim. -Chronic anticoagulation with Xarelto due to LE DVT.  The risks, benefits, and alternatives to colonoscopy and EGD were discussed with the patient and he consents to proceed.  Will plan for procedures at [place] where [facility] can  be performed if needed.

## 2015-08-02 NOTE — Telephone Encounter (Signed)
Any suggestions>?

## 2015-08-02 NOTE — Anesthesia Postprocedure Evaluation (Signed)
Anesthesia Post Note  Patient: Tommy Yang  Procedure(s) Performed: Procedure(s) (LRB): COLONOSCOPY WITH PROPOFOL w/ APC (N/A) ESOPHAGOGASTRODUODENOSCOPY (EGD) WITH PROPOFOL/ possible dilation. (N/A) BALLOON DILATION (N/A)  Patient location during evaluation: PACU Anesthesia Type: MAC Level of consciousness: awake and alert Pain management: pain level controlled Vital Signs Assessment: post-procedure vital signs reviewed and stable Respiratory status: spontaneous breathing, nonlabored ventilation, respiratory function stable and patient connected to nasal cannula oxygen Cardiovascular status: stable and blood pressure returned to baseline Anesthetic complications: no    Last Vitals:  Filed Vitals:   08/02/15 1020 08/02/15 1030  BP: 145/73 147/74  Pulse: 61 60  Temp:    Resp: 13 17    Last Pain: There were no vitals filed for this visit.               Bell

## 2015-08-03 ENCOUNTER — Encounter (HOSPITAL_COMMUNITY): Payer: Self-pay | Admitting: Gastroenterology

## 2015-08-03 NOTE — Telephone Encounter (Signed)
I spoke with the patient. He is going to have some sugar water or clear Gatorade. He knows not to drink any drinks with color in them.

## 2015-08-07 ENCOUNTER — Telehealth: Payer: Self-pay | Admitting: Gastroenterology

## 2015-08-07 NOTE — Telephone Encounter (Signed)
We spoke.  He's had very slight rectal bleeding since colonoscopy, APC.  Bowels slowly getting back to normal after the colonoscopy.  He is still on pradaxa, should continue it.  He'll call if bleeding worsens

## 2015-08-15 ENCOUNTER — Ambulatory Visit (INDEPENDENT_AMBULATORY_CARE_PROVIDER_SITE_OTHER): Payer: BLUE CROSS/BLUE SHIELD | Admitting: Endocrinology

## 2015-08-15 ENCOUNTER — Encounter: Payer: Self-pay | Admitting: Endocrinology

## 2015-08-15 VITALS — BP 124/74 | HR 69 | Temp 98.3°F | Resp 14 | Ht 71.0 in | Wt 302.6 lb

## 2015-08-15 DIAGNOSIS — E782 Mixed hyperlipidemia: Secondary | ICD-10-CM | POA: Diagnosis not present

## 2015-08-15 DIAGNOSIS — E1165 Type 2 diabetes mellitus with hyperglycemia: Secondary | ICD-10-CM | POA: Diagnosis not present

## 2015-08-15 DIAGNOSIS — E118 Type 2 diabetes mellitus with unspecified complications: Secondary | ICD-10-CM | POA: Diagnosis not present

## 2015-08-15 LAB — POCT GLYCOSYLATED HEMOGLOBIN (HGB A1C): Hemoglobin A1C: 6.2

## 2015-08-15 MED ORDER — GLIPIZIDE ER 2.5 MG PO TB24: 2.5000 mg | ORAL_TABLET | Freq: Every day | ORAL | Status: DC

## 2015-08-15 MED ORDER — CANAGLIFLOZIN 100 MG PO TABS: ORAL_TABLET | ORAL | Status: DC

## 2015-08-15 NOTE — Patient Instructions (Addendum)
INVOKANA:new medication,take that before breakfast daily  When starting Invokana make the following changes:  Stop AMLODIPINE  CHANGE glipizide to 2.5 mg 2 times daily for 1 week then 1x daily  AND if blood sugars are staying below 80-90 stop this completely  Check blood sugars on waking up  2-3 times a week Also check blood sugars about 2 hours after a meal and do this after different meals by rotation  Recommended blood sugar levels on waking up is 90-130 and about 2 hours after meal is 130-160  Please bring your blood sugar monitor to each visit, thank you

## 2015-08-15 NOTE — Progress Notes (Signed)
Patient ID: Tommy Yang, male   DOB: [birth year], 64 y.o.   MRN: [id number]           Reason for Appointment: Follow-up for Type 2 Diabetes  Referring physician: Daub  History of Present Illness:          Diagnosis: Type 2 diabetes mellitus, date of diagnosis: 05/2013       Past history:   He apparently had significant hyperglycemia in 2014 when seen in the urgent care center but did not establish with a PCP for control. He was started on treatment for his diabetes in 10/2013 and he was initially treated with metformin and Januvia A few months later he was also given glipizide ER to help his control when his A1c had gone up to 10.9 He says he was also seen by dietitian but no record available of this. Subsequently his blood sugar control had improved with A1c down to 6.8 in 9/15  Recent history:   On his consultation in 3/16 he was having high blood sugars and weight gain; A1c was 9.1% despite taking glipizide ER, Januvia and metformin.  With taking Trulicity instead of Januvia he has had progressive improvement in his glucose control A1c is now down to 6.2%  Current blood sugar patterns and problems identified:  He has had some higher blood sugars at times, mostly when he gets steroid injections including about 2-3 weeks ago  He says he gains weight when he gets steroids and has difficulty with weight loss anyway  He is trying to do some walking   Fasting blood sugars are fairly good especially recently, mostly in the 120-140 range  Has only sporadic high readings after meals, highest when he forgot to take his metformin at dinner time        Oral hypoglycemic drugs the patient is taking are: Glipizide ER 10 mg in am, metformin 1 g a.m. and 1.5 g p.m. Side effects from medications have been: None Compliance with the medical regimen: Good  Hypoglycemia: None recently    Glucose monitoring:  done 1-2 times a day         Glucometer: One Touch.      Blood Glucose readings  by   Mean values apply above for all meters except median for One Touch  PRE-MEAL Fasting Lunch Dinner Bedtime Overall  Glucose range:  115-175     96-202    Mean/median: 133 138 131 161 133     Self-care: The diet that the patient has been following is: tries to limit starches and fats .     Meals: 3 meals per day. Breakfast is sometimes cereal, frequently having soup for lunch           Exercise: walking up to 1 mile, 5 days a week in the mornings         Dietician visit, most recent:?  2015 .               Weight history:    Wt Readings from Last 3 Encounters:  08/15/15 302 lb 9.6 oz (137.258 kg)  08/02/15 287 lb (130.182 kg)  07/18/15 301 lb 3.2 oz (136.623 kg)    Glycemic control:   Lab Results  Component Value Date   HGBA1C 6.2 08/15/2015   HGBA1C 6.6 05/17/2015   HGBA1C 7.6* 02/08/2015   Lab Results  Component Value Date   MICROALBUR 9.9* 04/05/2015   LDLCALC 98 10/31/2014   CREATININE 0.90 02/08/2015  Medication List       This list is accurate as of: 08/15/15 12:55 PM.  Always use your most recent med list.               amLODipine 2.5 MG tablet  Commonly known as:  NORVASC  Take 1 tablet (2.5 mg total) by mouth daily.     atorvastatin 40 MG tablet  Commonly known as:  LIPITOR  TAKE ONE TABLET BY MOUTH ONCE DAILY.     blood glucose meter kit and supplies Kit  Test blood sugar daily as directed. Dx code: E11.9     canagliflozin 100 MG Tabs tablet  Commonly known as:  INVOKANA  1 tablet before breakfast     CINNAMON PO  Take 1,000 mg by mouth daily.     Fish Oil 1000 MG Caps  Take 1,000 mg by mouth 2 (two) times daily.     glipiZIDE 2.5 MG 24 hr tablet  Commonly known as:  GLUCOTROL XL  Take 1 tablet (2.5 mg total) by mouth daily with breakfast.     glucose blood test strip  Check twice daily. DX: E11.65 One touch ultra Blue     losartan 100 MG tablet  Commonly known as:  COZAAR  TAKE ONE TABLET BY MOUTH ONCE DAILY      metFORMIN 1000 MG tablet  Commonly known as:  GLUCOPHAGE  Pt is to take 1 and half tabs per day     Na Sulfate-K Sulfate-Mg Sulf Soln  Take 1 kit by mouth once.     omeprazole 40 MG capsule  Commonly known as:  PRILOSEC  Take 1 capsule (40 mg total) by mouth 2 (two) times daily before a meal.     rivaroxaban 20 MG Tabs tablet  Commonly known as:  XARELTO  TAKE ONE TABLET BY MOUTH ONCE DAILY WITH  SUPPER.     TRULICITY 1.5 XN/1.7GY Sopn  Generic drug:  Dulaglutide  Inject contents of one(1) pen per week     Vitamin D (Cholecalciferol) 400 UNITS Tabs  Take 400 Units by mouth daily.        Allergies: No Known Allergies  Past Medical History  Diagnosis Date  . Hypertension   . Diabetes mellitus without complication ([place])   . DVT (deep venous thrombosis) ([facility])     left leg.  [name] GERD (gastroesophageal reflux disease)   . Hypercholesterolemia   . Prostate cancer ([place]) 12/06/13    gleason 4+3=7, 6/12 cores positive. seed implant and radiation    Past Surgical History  Procedure Laterality Date  . Appendectomy    . Spine surgery   1964 and 1967    Tourette'ssyndrome  . Prostate biopsy  12/06/13    Gleason 4+3=7, volume 35 gm  . Knee surgery      right knee orthoscopic  . Tonsillectomy    . Colonoscopy with propofol N/A 08/02/2015    Procedure: COLONOSCOPY WITH PROPOFOL w/ APC;  Surgeon: [name], MD;  Location: [facility];  Service: Endoscopy;  Laterality: N/A;  . Esophagogastroduodenoscopy (egd) with propofol N/A 08/02/2015    Procedure: ESOPHAGOGASTRODUODENOSCOPY (EGD) WITH PROPOFOL/ possible dilation.;  Surgeon: [name], MD;  Location: [facility];  Service: Endoscopy;  Laterality: N/A;  . Balloon dilation N/A 08/02/2015    Procedure: BALLOON DILATION;  Surgeon: [name], MD;  Location: [facility] ENDOSCOPY;  Service: Endoscopy;  Laterality: N/A;    Family History  Problem Relation Age of Onset  . Heart disease Mother  before age 97  . Diabetes  Mother   . Hyperlipidemia Mother   . Varicose Veins Mother   . Heart disease Father   . Diabetes Father   . Cancer Neg Hx     Social History:  reports that he quit smoking about 3 years ago. His smoking use included Cigarettes. He has a 11.5 pack-year smoking history. He has never used smokeless tobacco. He reports that he does not drink alcohol or use illicit drugs.    Review of Systems         Lipids: he has had significant hyperlipidemia and has been treated with Lipitor 40 mg daily; no CAD       Lab Results  Component Value Date   CHOL 170 02/08/2015   HDL 35.40* 02/08/2015   LDLCALC 98 10/31/2014   LDLDIRECT 103.0 02/08/2015   TRIG 210.0* 02/08/2015   CHOLHDL 5 02/08/2015                   The blood pressure has been treated with losartan 100 mg and low-dose amlodipine, followed by PCP and cardiologist   LABS:  Office Visit on 08/15/2015  Component Date Value Ref Range Status  . Hemoglobin A1C 08/15/2015 6.2   Final    Physical Examination:  BP 124/74 mmHg  Pulse 69  Temp(Src) 98.3 F (36.8 C)  Resp 14  Ht '5\' 11"'  (1.803 m)  Wt 302 lb 9.6 oz (137.258 kg)  BMI 42.22 kg/m2  SpO2 95%          ASSESSMENT:   Diabetes type 2, uncontrolled   See history of present illness for detailed discussion of his current management, blood sugar patterns and problems identified    Currently on a regimen of 5.8IF Trulicity along with glipizide ER 10 mg daily and metformin 2.5 g daily. He has had improved control with A1c 6.2 He does tend to have high readings with periodic steroid injections and this also causes weight gain Overall still having difficulty losing weight Although he thinks he is fairly consistent with diet he has not had any formal meal planning instructions lately He is doing fairly well with checking his blood sugars at various times, on an average twice a day  HYPERTENSION: Well controlled  PLAN:  Trial of Invokana 100 mg daily. Discussed action of  SGLT 2 drugs on lowering glucose by decreasing kidney absorption of glucose, benefits of weight loss and lower blood pressure, possible side effects including candidiasis and dosage regimen  While starting Invokana will need to taper off his glipizide.  Will have him take 5 mg daily for the first week and then 2.5, he will stop the medication if blood sugars are in the low normal range Consider combining Invokana with metformin on the next visit Stop amlodipine for now since blood pressure may get low Consistent exercise and diet Consider consultation with dietitian Take blood pressure periodically at home also  Counseling time on subjects discussed above is over 50% of today's 25 minute visit   Patient Instructions  INVOKANA:new medication,take that before breakfast daily  When starting Invokana make the following changes:  Stop AMLODIPINE  CHANGE glipizide to 2.5 mg 2 times daily for 1 week then 1x daily  AND if blood sugars are staying below 80-90 stop this completely  Check blood sugars on waking up  2-3 times a week Also check blood sugars about 2 hours after a meal and do this after different meals by rotation  Recommended blood  sugar levels on waking up is 90-130 and about 2 hours after meal is 130-160  Please bring your blood sugar monitor to each visit, thank you     [facility] 08/15/2015, 12:55 PM   Note: This office note was prepared with Dragon voice recognition system technology. Any transcriptional errors that result from this process are unintentional.

## 2015-08-30 ENCOUNTER — Emergency Department (HOSPITAL_COMMUNITY): Payer: BLUE CROSS/BLUE SHIELD

## 2015-08-30 ENCOUNTER — Emergency Department (HOSPITAL_COMMUNITY)
Admission: EM | Admit: 2015-08-30 | Discharge: 2015-08-30 | Disposition: A | Discharge status: Home / Self Care | Payer: BLUE CROSS/BLUE SHIELD | Attending: Emergency Medicine | Admitting: Emergency Medicine

## 2015-08-30 ENCOUNTER — Telehealth: Payer: Self-pay | Admitting: Cardiovascular Disease

## 2015-08-30 ENCOUNTER — Encounter (HOSPITAL_COMMUNITY): Payer: Self-pay | Admitting: Emergency Medicine

## 2015-08-30 ENCOUNTER — Ambulatory Visit (INDEPENDENT_AMBULATORY_CARE_PROVIDER_SITE_OTHER): Payer: BLUE CROSS/BLUE SHIELD | Admitting: Emergency Medicine

## 2015-08-30 VITALS — BP 117/79 | HR 58 | Temp 97.5°F | Resp 16 | Ht 70.0 in | Wt 285.6 lb

## 2015-08-30 DIAGNOSIS — Z7901 Long term (current) use of anticoagulants: Secondary | ICD-10-CM | POA: Diagnosis not present

## 2015-08-30 DIAGNOSIS — I10 Essential (primary) hypertension: Secondary | ICD-10-CM | POA: Diagnosis not present

## 2015-08-30 DIAGNOSIS — I480 Paroxysmal atrial fibrillation: Secondary | ICD-10-CM | POA: Diagnosis not present

## 2015-08-30 DIAGNOSIS — K219 Gastro-esophageal reflux disease without esophagitis: Secondary | ICD-10-CM | POA: Insufficient documentation

## 2015-08-30 DIAGNOSIS — Z8546 Personal history of malignant neoplasm of prostate: Secondary | ICD-10-CM | POA: Diagnosis not present

## 2015-08-30 DIAGNOSIS — E7800 Pure hypercholesterolemia, unspecified: Secondary | ICD-10-CM | POA: Diagnosis not present

## 2015-08-30 DIAGNOSIS — Z79899 Other long term (current) drug therapy: Secondary | ICD-10-CM | POA: Diagnosis not present

## 2015-08-30 DIAGNOSIS — Z87891 Personal history of nicotine dependence: Secondary | ICD-10-CM | POA: Insufficient documentation

## 2015-08-30 DIAGNOSIS — Z86718 Personal history of other venous thrombosis and embolism: Secondary | ICD-10-CM | POA: Insufficient documentation

## 2015-08-30 DIAGNOSIS — E119 Type 2 diabetes mellitus without complications: Secondary | ICD-10-CM | POA: Diagnosis not present

## 2015-08-30 DIAGNOSIS — R0789 Other chest pain: Secondary | ICD-10-CM

## 2015-08-30 DIAGNOSIS — R0602 Shortness of breath: Secondary | ICD-10-CM | POA: Diagnosis present

## 2015-08-30 LAB — CBC WITH DIFFERENTIAL/PLATELET
Basophils Absolute: 0 10*3/uL (ref 0.0–0.1)
Basophils Relative: 0 %
Eosinophils Absolute: 0.1 10*3/uL (ref 0.0–0.7)
Eosinophils Relative: 1 %
HCT: 41.8 % (ref 39.0–52.0)
Hemoglobin: 14.1 g/dL (ref 13.0–17.0)
Lymphocytes Relative: 34 %
Lymphs Abs: 3.3 10*3/uL (ref 0.7–4.0)
MCH: 27.2 pg (ref 26.0–34.0)
MCHC: 33.7 g/dL (ref 30.0–36.0)
MCV: 80.7 fL (ref 78.0–100.0)
Monocytes Absolute: 1 10*3/uL (ref 0.1–1.0)
Monocytes Relative: 10 %
Neutro Abs: 5.2 10*3/uL (ref 1.7–7.7)
Neutrophils Relative %: 55 %
Platelets: 255 10*3/uL (ref 150–400)
RBC: 5.18 MIL/uL (ref 4.22–5.81)
RDW: 14.5 % (ref 11.5–15.5)
WBC: 9.6 10*3/uL (ref 4.0–10.5)

## 2015-08-30 LAB — BASIC METABOLIC PANEL
Anion gap: 12 (ref 5–15)
BUN: 24 mg/dL — ABNORMAL HIGH (ref 6–20)
CO2: 21 mmol/L — ABNORMAL LOW (ref 22–32)
Calcium: 9.4 mg/dL (ref 8.9–10.3)
Chloride: 108 mmol/L (ref 101–111)
Creatinine, Ser: 1.25 mg/dL — ABNORMAL HIGH (ref 0.61–1.24)
GFR calc Af Amer: >60 mL/min (ref >60)
GFR calc non Af Amer: >60 mL/min (ref >60)
Glucose, Bld: 136 mg/dL — ABNORMAL HIGH (ref 65–99)
Potassium: 4.2 mmol/L (ref 3.5–5.1)
Sodium: 141 mmol/L (ref 135–145)

## 2015-08-30 LAB — TROPONIN I: Troponin I: <0.03 ng/mL (ref <0.031)

## 2015-08-30 LAB — BRAIN NATRIURETIC PEPTIDE: B Natriuretic Peptide: 266.5 pg/mL — ABNORMAL HIGH (ref 0.0–100.0)

## 2015-08-30 MED ORDER — ETOMIDATE 2 MG/ML IV SOLN
0.1500 mg/kg | Freq: Once | INTRAVENOUS | Status: DC
  Filled 2015-08-30: qty 10

## 2015-08-30 MED ORDER — DILTIAZEM HCL 25 MG/5ML IV SOLN
10.0000 mg | Freq: Once | INTRAVENOUS | Status: AC
  Administered 2015-08-30: 10 mg via INTRAVENOUS
  Filled 2015-08-30: qty 5

## 2015-08-30 MED ORDER — ONDANSETRON HCL 4 MG/2ML IJ SOLN
4.0000 mg | Freq: Once | INTRAMUSCULAR | Status: DC
  Filled 2015-08-30: qty 2

## 2015-08-30 MED ORDER — SODIUM CHLORIDE 0.9 % IV BOLUS (SEPSIS): 500.0000 mL | Freq: Once | INTRAVENOUS | Status: DC

## 2015-08-30 MED ORDER — METOPROLOL TARTRATE 25 MG PO TABS: 25.0000 mg | ORAL_TABLET | Freq: Two times a day (BID) | ORAL | Status: DC

## 2015-08-30 NOTE — ED Notes (Signed)
Pt's heart rhythm noted to be in sinus rhythm with rate in 60's.  EKG repeated and given to Dr.Little.

## 2015-08-30 NOTE — ED Provider Notes (Addendum)
CSN: [id number]     Arrival date & time 08/30/15  1044 History   First MD Initiated Contact with Patient 08/30/15 1047     Chief Complaint  Patient presents with  . Atrial Fibrillation     (Consider location/radiation/quality/duration/timing/severity/associated sxs/prior Treatment) HPI Comments: 64yo M w/ PMH including HTN, T2DM, HLD, GERD, DVT on Xarelto, prostate CA who p/w shortness of breath. Patient saw his PCP routinely today for follow-up of his diabetes. He noted some intermittent episodes of shortness of breath with exertion and his PCP got an EKG which showed atrial fibrillation with rapid ventricular response, therefore he was sent here for further care. He should notes 4-5 episodes over the past year or 2 of feeling like his heart is racing. These episodes can last up to 10 hours and have occasionally kept him up at night. His last episode was several months ago. He denies any heart racing sensation or chest pain currently. He has noted some shortness of breath with exertion which she felt earlier today when walking here but denies any shortness of breath at rest. He denies any cough/cold symptoms, vomiting, fevers, or recent illness. He is currently on Xarelto for DVT.  Patient is a 64 y.o. male presenting with atrial fibrillation. The history is provided by the patient.  Atrial Fibrillation    Past Medical History  Diagnosis Date  . Hypertension   . Diabetes mellitus without complication ([place])   . DVT (deep venous thrombosis) ([facility])     left leg.  [name] GERD (gastroesophageal reflux disease)   . Hypercholesterolemia   . Prostate cancer ([place]) 12/06/13    gleason 4+3=7, 6/12 cores positive. seed implant and radiation   Past Surgical History  Procedure Laterality Date  . Appendectomy    . Spine surgery   1964 and 1967    Tourette'ssyndrome  . Prostate biopsy  12/06/13    Gleason 4+3=7, volume 35 gm  . Knee surgery      right knee orthoscopic  . Tonsillectomy    . Colonoscopy  with propofol N/A 08/02/2015    Procedure: COLONOSCOPY WITH PROPOFOL w/ APC;  Surgeon: [name], MD;  Location: [facility];  Service: Endoscopy;  Laterality: N/A;  . Esophagogastroduodenoscopy (egd) with propofol N/A 08/02/2015    Procedure: ESOPHAGOGASTRODUODENOSCOPY (EGD) WITH PROPOFOL/ possible dilation.;  Surgeon: [name], MD;  Location: [facility];  Service: Endoscopy;  Laterality: N/A;  . Balloon dilation N/A 08/02/2015    Procedure: BALLOON DILATION;  Surgeon: [name], MD;  Location: [facility];  Service: Endoscopy;  Laterality: N/A;   Family History  Problem Relation Age of Onset  . Heart disease Mother     before age 41  . Diabetes Mother   . Hyperlipidemia Mother   . Varicose Veins Mother   . Heart disease Father   . Diabetes Father   . Cancer Neg Hx    Social History  Substance Use Topics  . Smoking status: Former Smoker -- 0.50 packs/day for 23 years    Types: Cigarettes    Quit date: 08/01/2012  . Smokeless tobacco: Never Used  . Alcohol Use: No    Review of Systems 10 Systems reviewed and are negative for acute change except as noted in the HPI.    Allergies  Review of patient's allergies indicates no known allergies.  Home Medications   Prior to Admission medications   Medication Sig Start Date End Date Taking? Authorizing Provider  amLODipine (NORVASC) 2.5 MG tablet Take 1  tablet (2.5 mg total) by mouth daily. 01/11/15  Yes Troy Sine, MD  atorvastatin (LIPITOR) 40 MG tablet TAKE ONE TABLET BY MOUTH ONCE DAILY. 11/13/14  Yes Darlyne Russian, MD  blood glucose meter kit and supplies KIT Test blood sugar daily as directed. Dx code: E11.9 01/23/15  Yes Darlyne Russian, MD  canagliflozin [facility]) 100 MG TABS tablet 1 tablet before breakfast 08/15/15  Yes Elayne Snare, MD  CINNAMON PO Take 1,000 mg by mouth daily.   Yes Historical Provider, MD  glipiZIDE (GLUCOTROL XL) 2.5 MG 24 hr tablet Take 1 tablet (2.5 mg total) by mouth daily with  breakfast. 08/15/15  Yes Elayne Snare, MD  glucose blood test strip Check twice daily. DX: E11.65 One touch ultra Blue 05/28/15  Yes Darlyne Russian, MD  losartan (COZAAR) 100 MG tablet TAKE ONE TABLET BY MOUTH ONCE DAILY 11/13/14  Yes Darlyne Russian, MD  metFORMIN (GLUCOPHAGE) 1000 MG tablet Pt is to take 1 and half tabs per day Patient taking differently: Take 1,000 mg by mouth daily with breakfast. Pt is to take 1 and half tabs per day 02/13/15  Yes Elayne Snare, MD  Omega-3 Fatty Acids (FISH OIL) 1000 MG CAPS Take 1,000 mg by mouth 2 (two) times daily.   Yes Historical Provider, MD  omeprazole (PRILOSEC) 40 MG capsule Take 1 capsule (40 mg total) by mouth 2 (two) times daily before a meal. 08/02/15  Yes [name], MD  rivaroxaban (XARELTO) 20 MG TABS tablet TAKE ONE TABLET BY MOUTH ONCE DAILY WITH  SUPPER. 11/13/14  Yes Darlyne Russian, MD  TRULICITY 1.5 TD/1.7OH SOPN Inject contents of one(1) pen per week Patient taking differently: Inject 1.5 mg into the skin every Friday. Inject contents of one(1) pen per week 02/13/15  Yes Elayne Snare, MD  Vitamin D, Cholecalciferol, 400 UNITS TABS Take 400 Units by mouth daily.   Yes Historical Provider, MD  metoprolol (LOPRESSOR) 25 MG tablet Take 1 tablet (25 mg total) by mouth 2 (two) times daily. 08/30/15   Wenda Overland Little, MD   BP 100/67 mmHg  Pulse 136  Temp(Src) 98.5 F (36.9 C) (Oral)  Resp 18  Ht 5' 11" (1.803 m)  Wt 285 lb (129.275 kg)  BMI 39.77 kg/m2  SpO2 98% Physical Exam  Constitutional: He is oriented to person, place, and time. He appears well-developed and well-nourished. No distress.  HENT:  Head: Normocephalic and atraumatic.  Moist mucous membranes  Eyes: Conjunctivae are normal. Pupils are equal, round, and reactive to light.  Neck: Neck supple.  Cardiovascular: Normal heart sounds.   No murmur heard. Tachycardic, irregularly irregular rhythm  Pulmonary/Chest: [place] normal and breath sounds normal. No respiratory distress.  He has no wheezes.  Abdominal: Soft. Bowel sounds are normal. He exhibits no distension. There is no tenderness.  Musculoskeletal:  Trace BLE edema  Neurological: He is alert and oriented to person, place, and time.  Fluent speech  Skin: Skin is warm and dry.  Psychiatric: He has a normal mood and affect. Judgment normal.  pleasant  Nursing note and vitals reviewed.   ED Course  .Critical Care Performed by: Sharlett Iles Authorized by: Sharlett Iles Total critical care time: 40 minutes Critical care time was exclusive of separately billable procedures and treating other patients. Critical care was necessary to treat or prevent imminent or life-threatening deterioration of the following conditions: cardiac failure. Critical care was time spent personally by me on the following activities: development  of treatment plan with patient or surrogate, discussions with consultants, evaluation of patient's response to treatment, examination of patient, obtaining history from patient or surrogate, ordering and performing treatments and interventions, ordering and review of laboratory studies, ordering and review of radiographic studies, re-evaluation of patient's condition and review of old charts.   (including critical care time) Labs Review Labs Reviewed  BASIC METABOLIC PANEL - Abnormal; Notable for the following:    CO2 21 (*)    Glucose, Bld 136 (*)    BUN 24 (*)    Creatinine, Ser 1.25 (*)    All other components within normal limits  BRAIN NATRIURETIC PEPTIDE - Abnormal; Notable for the following:    B Natriuretic Peptide 266.5 (*)    All other components within normal limits  CBC WITH DIFFERENTIAL/PLATELET  TROPONIN I    Imaging Review Dg Chest 2 View  08/30/2015  CLINICAL DATA:  64 year old male with chest pain and shortness of breath today. New onset of atrial fibrillation. EXAM: CHEST  2 VIEW COMPARISON:  Chest x-ray 01/11/2015. FINDINGS: Lung volumes are  normal. No consolidative airspace disease. No pleural effusions. No pneumothorax. Subtle nodular density noted in the lateral aspect of the right lung base between the posterolateral aspect of the right eighth and ninth ribs, new compared to prior examinations. Pulmonary vasculature and the cardiomediastinal silhouette are within normal limits. IMPRESSION: 1. No radiographic evidence of acute cardiopulmonary disease. 2. New small nodular density projecting over the lateral aspect of the right lung base, as above. This may be artifactual, but repeat evaluation with standing PA and lateral chest radiograph is recommended in 1-2 months to ensure the resolution of this finding. Electronically Signed   By: Vinnie Langton M.D.   On: 08/30/2015 12:04   I have personally reviewed and evaluated these lab results as part of my medical decision-making.   EKG Interpretation   Date/Time:  Thursday August 30 2015 13:17:44 EST Ventricular Rate:  62 PR Interval:  100 QRS Duration: 82 QT Interval:  416 QTC Calculation: 422 R Axis:   78 Text Interpretation:  Sinus rhythm with short PR Nonspecific T wave  abnormality Abnormal ECG converted to sinus rhythm, now T wave inversions  in lateral leads Confirmed by LITTLE MD, RACHEL [phone]) on 08/30/2015  1:26:05 PM     Medications  ondansetron (ZOFRAN) injection 4 mg (not administered)  etomidate (AMIDATE) injection 19.4 mg (not administered)  sodium chloride 0.9 % bolus 500 mL (not administered)  diltiazem (CARDIZEM) injection 10 mg (10 mg Intravenous Given 08/30/15 1141)    MDM   Final diagnoses:  Paroxysmal atrial fibrillation ([place])   patient sent from PCPs office after he was found to be in A. fib with RVR on EKG, no history of A. Fib. On exam, pt well appearing. VS notable for variable HR 100-130, O2 sat normal on RA. Irregularly irregular rhythm noted, no abnormal breath sounds. EKG shows atrial fibrillation with RVR. Obtained above lab work and  chest x-ray. Gave the patient 10 mg IV diltiazem.  On reexamination, the patient remained in atrial fibrillation with RVR and his pressure had dropped to 761 systolic. Consulted cardiology and spoke with Dr. Percival Spanish, appreciate his assistance. Given that the patient is already anticoagulated, he recommended electrical cardioversion. Obtained consent after discussion of risks and benefits. While we were setting up for cardioversion, the patient spontaneously converted to sinus rhythm with rate in the 60s. On reexamination, he remains comfortable and denied any complaints. Repeat EKG confirms sinus rhythm,  mild T-wave inversions in lateral leads. Spoke with Dr. Percival Spanish again and per his recommendation I gave the patient prescription for metoprolol twice a day with instructions to follow-up in the cardiology clinic. Discussed EKG changes with Dr. Percival Spanish and he was comfortable w/ d/c given patient without chest pain, SOB, or any other symptoms and his troponin here is negative. Return precautions reviewed. Patient voiced understanding and was discharged in satisfactory condition.  Sharlett Iles, MD 08/30/15 1350  Regarding questionable artifact vs nodule on CXR, I sent a secure message to pt's PCP, Dr. Everlene Farrier, to ensure follow up for repeat CXR.  Sharlett Iles, MD 09/01/15 [phone]

## 2015-08-30 NOTE — ED Notes (Addendum)
Per EMS: pt from PCP c/o afib; pt sts some SOB with exertion; pt sts no hx of afib; pt takes xerelto currently for DVT; IV 20g left wrist

## 2015-08-30 NOTE — Progress Notes (Signed)
By signing my name below, I, Moises Blood, attest that this documentation has been prepared under the direction and in the presence of [name], MD. Electronically Signed: Moises Blood, [place]. 08/30/2015 , 9:40 AM .  Patient was seen in room 23 .  Chief Complaint:  Chief Complaint  Patient presents with  . Follow-up    diabetes and LEFT leg blood clot   . Medication Refill    lipitor    HPI: Tommy Yang is a 64 y.o. male who reports to [facility] today for follow up on DM and left leg blood clot.   Weight He's loss about 20 lbs this year.   DM He takes invokana once a day.  After he eats, his sugar ranges from 135-170.  His A1c was 6.0 when he last saw Dr. Dwyane Dee.   PSA  He had h/o prostate cancer.   Cancer Screening He had colonoscopy recently with 1 polyp, otherwise normal.   DVT He had a blood clot in his left leg.   Cardiology He last saw Dr. Claiborne Billings in May with normal. He noted some chest tightness and discomfort recently, more after eating foods with higher sodium.   Past Medical History  Diagnosis Date  . Hypertension   . Diabetes mellitus without complication ([place])   . DVT (deep venous thrombosis) ([facility])     left leg.  [name] GERD (gastroesophageal reflux disease)   . Hypercholesterolemia   . Prostate cancer ([place]) 12/06/13    gleason 4+3=7, 6/12 cores positive. seed implant and radiation   Past Surgical History  Procedure Laterality Date  . Appendectomy    . Spine surgery   1964 and 1967    Tourette'ssyndrome  . Prostate biopsy  12/06/13    Gleason 4+3=7, volume 35 gm  . Knee surgery      right knee orthoscopic  . Tonsillectomy    . Colonoscopy with propofol N/A 08/02/2015    Procedure: COLONOSCOPY WITH PROPOFOL w/ APC;  Surgeon: [name], MD;  Location: [facility];  Service: Endoscopy;  Laterality: N/A;  . Esophagogastroduodenoscopy (egd) with propofol N/A 08/02/2015    Procedure: ESOPHAGOGASTRODUODENOSCOPY (EGD) WITH PROPOFOL/ possible  dilation.;  Surgeon: [name], MD;  Location: [facility];  Service: Endoscopy;  Laterality: N/A;  . Balloon dilation N/A 08/02/2015    Procedure: BALLOON DILATION;  Surgeon: [name], MD;  Location: [facility];  Service: Endoscopy;  Laterality: N/A;   Social History   Social History  . Marital Status: Married    Spouse Name: N/A  . Number of Children: 1  . Years of Education: N/A   Occupational History  .     Social History Main Topics  . Smoking status: Former Smoker -- 0.50 packs/day for 23 years    Types: Cigarettes    Quit date: 08/01/2012  . Smokeless tobacco: Never Used  . Alcohol Use: No  . Drug Use: No  . Sexual Activity: Yes    Birth Control/ Protection: Abstinence   Other Topics Concern  . Not on file   Social History Narrative   Family History  Problem Relation Age of Onset  . Heart disease Mother     before age 93  . Diabetes Mother   . Hyperlipidemia Mother   . Varicose Veins Mother   . Heart disease Father   . Diabetes Father   . Cancer Neg Hx    No Known Allergies Prior to Admission medications   Medication Sig Start Date  End Date Taking? Authorizing Provider  amLODipine (NORVASC) 2.5 MG tablet Take 1 tablet (2.5 mg total) by mouth daily. 01/11/15   Troy Sine, MD  atorvastatin (LIPITOR) 40 MG tablet TAKE ONE TABLET BY MOUTH ONCE DAILY. 11/13/14   Darlyne Russian, MD  blood glucose meter kit and supplies KIT Test blood sugar daily as directed. Dx code: E11.9 01/23/15   Darlyne Russian, MD  canagliflozin [facility]) 100 MG TABS tablet 1 tablet before breakfast 08/15/15   Elayne Snare, MD  CINNAMON PO Take 1,000 mg by mouth daily.    Historical Provider, MD  glipiZIDE (GLUCOTROL XL) 2.5 MG 24 hr tablet Take 1 tablet (2.5 mg total) by mouth daily with breakfast. 08/15/15   Elayne Snare, MD  glucose blood test strip Check twice daily. DX: E11.65 One touch ultra Blue 05/28/15   Darlyne Russian, MD  losartan (COZAAR) 100 MG tablet TAKE ONE TABLET BY  MOUTH ONCE DAILY 11/13/14   Darlyne Russian, MD  metFORMIN (GLUCOPHAGE) 1000 MG tablet Pt is to take 1 and half tabs per day Patient taking differently: Take 1,000 mg by mouth 2 (two) times daily with a meal. Pt is to take 1 and half tabs per day 02/13/15   Elayne Snare, MD  Omega-3 Fatty Acids (FISH OIL) 1000 MG CAPS Take 1,000 mg by mouth 2 (two) times daily.    Historical Provider, MD  omeprazole (PRILOSEC) 40 MG capsule Take 1 capsule (40 mg total) by mouth 2 (two) times daily before a meal. 08/02/15   [name], MD  rivaroxaban (XARELTO) 20 MG TABS tablet TAKE ONE TABLET BY MOUTH ONCE DAILY WITH  SUPPER. 11/13/14   Darlyne Russian, MD  TRULICITY 1.5 JQ/3.0SP SOPN Inject contents of one(1) pen per week Patient taking differently: Inject 1.5 mg into the skin every Friday. Inject contents of one(1) pen per week 02/13/15   Elayne Snare, MD  Vitamin D, Cholecalciferol, 400 UNITS TABS Take 400 Units by mouth daily.    Historical Provider, MD     ROS:  Constitutional: negative for fever, chills, night sweats, weight changes, or fatigue  HEENT: negative for vision changes, hearing loss, congestion, rhinorrhea, ST, epistaxis, or sinus pressure Cardiovascular: negative for chest pain or palpitations Respiratory: negative for hemoptysis, wheezing, shortness of breath, or cough Abdominal: negative for abdominal pain, nausea, vomiting, diarrhea, or constipation Dermatological: negative for rash Neurologic: negative for headache, dizziness, or syncope All other systems reviewed and are otherwise negative with the exception to those above and in the HPI.  PHYSICAL EXAM: Filed Vitals:   08/30/15 0904  BP: 117/79  Pulse: 58  Temp: 97.5 F (36.4 C)  Resp: 16   Body mass index is 40.98 kg/(m^2).   General: Alert, no acute distress HEENT:  Normocephalic, atraumatic, oropharynx patent. Eye: Juliette Mangle [facility] Cardiovascular:  Regular rate but irregular rhythm, no rubs murmurs or gallops.  No Carotid bruits,  radial pulse intact. No pedal edema.  Respiratory: Clear to auscultation bilaterally.  No wheezes, rales, or rhonchi.  No cyanosis, no use of accessory musculature Abdominal: No organomegaly, abdomen is soft and non-tender, positive bowel sounds. No masses. Musculoskeletal: Gait intact. No edema, tenderness, severe stasis changes of both legs, but no swelling of the left calf Skin: No rashes. Neurologic: Facial musculature symmetric. Psychiatric: Patient acts appropriately throughout our interaction.  Lymphatic: No cervical or submandibular lymphadenopathy Genitourinary/Anorectal: No acute findings   EKG/XRAY:   Primary read interpreted by Dr. Everlene Farrier at [facility]. EKG: afib  with rapid ventricular response   ASSESSMENT/PLAN: Pt presents with afib with rapid ventricular response. This could be triggered by metabolic disorder. His last ekg was sinus bradycardia. I personally performed the services described in this documentation, which was scribed in my presence. The recorded information has been reviewed and is accurate.  Gross sideeffects, risk and benefits, and alternatives of medications d/w patient. Patient is aware that all medications have potential sideeffects and we are unable to predict every sideeffect or drug-drug interaction that may occur.  [name] MD 08/30/2015 9:40 AM

## 2015-08-30 NOTE — Discharge Instructions (Signed)

## 2015-09-01 ENCOUNTER — Telehealth: Payer: Self-pay | Admitting: Emergency Medicine

## 2015-09-01 NOTE — Telephone Encounter (Signed)
Please call patient and be sure he follows up with me at 102 neck week. He can come in either Monday Wednesday or Friday. I need to repeat a chest x-ray as requested by the emergency room physician.

## 2015-09-03 ENCOUNTER — Telehealth: Payer: Self-pay

## 2015-09-03 NOTE — Telephone Encounter (Signed)
Patient needs to see Dr. Everlene Farrier for a hospital follow up and chest x-ray and there are no appointments available. Patient doesn't not want to come to the walk in

## 2015-09-03 NOTE — Telephone Encounter (Signed)
Spoke with pt, advised message from Dr. Everlene Farrier. He refuses to come into the walk in clinic. Can we schedule an appt?

## 2015-09-04 NOTE — Telephone Encounter (Signed)
Staci,  Can you please schedule this person an appt with Dr. Everlene Farrier for a hosp. Follow up? Thank you, Sherolyn Trettin  There weren't any openings.

## 2015-09-05 ENCOUNTER — Encounter: Payer: Self-pay | Admitting: Physician Assistant

## 2015-09-05 ENCOUNTER — Ambulatory Visit (INDEPENDENT_AMBULATORY_CARE_PROVIDER_SITE_OTHER): Payer: BLUE CROSS/BLUE SHIELD | Admitting: Physician Assistant

## 2015-09-05 VITALS — BP 142/68 | HR 58 | Ht 71.0 in | Wt 297.0 lb

## 2015-09-05 DIAGNOSIS — Z7901 Long term (current) use of anticoagulants: Secondary | ICD-10-CM

## 2015-09-05 DIAGNOSIS — I4891 Unspecified atrial fibrillation: Secondary | ICD-10-CM | POA: Diagnosis not present

## 2015-09-05 MED ORDER — METOPROLOL TARTRATE 25 MG PO TABS: 25.0000 mg | ORAL_TABLET | Freq: Two times a day (BID) | ORAL | Status: DC

## 2015-09-05 NOTE — Patient Instructions (Signed)
Medication Instructions:  Your physician recommends that you continue on your current medications as directed. Please refer to the Current Medication list given to you today.  Metoprolol has been refilled today  Labwork: None ordered  Testing/Procedures: None ordered  Follow-Up: Your physician recommends that you schedule a follow-up appointment in: 3 months with Dr.Kelly   Any Other Special Instructions Will Be Listed Below (If Applicable).     If you need a refill on your cardiac medications before your next appointment, please call your pharmacy.

## 2015-09-05 NOTE — Progress Notes (Signed)
Cardiology Office Note   Date:  09/05/2015   ID:  Tommy Yang, DOB [birth year], MRN [id number]  PCP:  Jenny Reichmann, MD  Cardiologist:  Dr Meredeth Ide, PA-C    History of Present Illness: Tommy Yang is a 65 y.o. male with a history of DVT on Xarelto, DM, HL, GERD, HTN.  12/29, seen in ER for SOB and was in afib, spontaneous conversion to SR.  Tommy Yang presents for post hospital follow-up of paroxysmal atrial fibrillation.  He didn't really have any palpitations. The main reason that he knew something was wrong was because he felt short of breath. He went to see his PCP as a routine visit and it was noted that his heartbeat was rapid and irregular. He was found to be in atrial fibrillation and sent to the emergency room. He had been compliant with his Xarelto which he takes for history of DVT. He had not missed any doses of the Xarelto and they were planning on cardioverting him, but he converted spontaneously before the got to it.  Since the ER visit, he has not had any shortness of breath that reminded him of the way felt when he was in A. fib. He never has palpitations. He checks his heart rate and blood pressure regularly and has not noted any rapid heart rates. He is compliant with the metoprolol and with the Xarelto.  He has had episodes of shortness of breath periodically over the years but has never been evaluated for them. He wonders if he has been going in and out of atrial fibrillation at times and that is what started the original DVT. He states he does not know why he originally had a DVT which was approximately 2 years ago, but has been on Xarelto ever since. 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 He is tolerating the medications well.  He has also been doing better with his diabetes, and has lost some weight. This is being managed  closely.   Past Medical History  Diagnosis Date  . Hypertension   . Diabetes mellitus without complication ([place])   . DVT (deep venous thrombosis) ([facility])     left leg.  [name] GERD (gastroesophageal reflux disease)   . Hypercholesterolemia   . Prostate cancer ([place]) 12/06/13    gleason 4+3=7, 6/12 cores positive. seed implant and radiation    Past Surgical History  Procedure Laterality Date  . Appendectomy    . Spine surgery   1964 and 1967    Tourette'ssyndrome  . Prostate biopsy  12/06/13    Gleason 4+3=7, volume 35 gm  . Knee surgery      right knee orthoscopic  . Tonsillectomy    . Colonoscopy with propofol N/A 08/02/2015    Procedure: COLONOSCOPY WITH PROPOFOL w/ APC;  Surgeon: [name], MD;  Location: [facility];  Service: Endoscopy;  Laterality: N/A;  . Esophagogastroduodenoscopy (egd) with propofol N/A 08/02/2015    Procedure: ESOPHAGOGASTRODUODENOSCOPY (EGD) WITH PROPOFOL/ possible dilation.;  Surgeon: [name], MD;  Location: [facility];  Service: Endoscopy;  Laterality: N/A;  . Balloon dilation N/A 08/02/2015    Procedure: BALLOON DILATION;  Surgeon: [name], MD;  Location: [facility];  Service: Endoscopy;  Laterality: N/A;    Current Outpatient Prescriptions  Medication Sig Dispense Refill  . atorvastatin (LIPITOR) 40 MG  tablet TAKE ONE TABLET BY MOUTH ONCE DAILY. 90 tablet 3  . blood glucose meter kit and supplies KIT Test blood sugar daily as directed. Dx code: E11.9 1 each 0  . canagliflozin (INVOKANA) 100 MG TABS tablet 1 tablet before breakfast 30 tablet 3  . CINNAMON PO Take 1,000 mg by mouth daily.    [name] glipiZIDE (GLUCOTROL XL) 2.5 MG 24 hr tablet Take 1 tablet (2.5 mg total) by mouth daily with breakfast. 30 tablet 1  . glucose blood test strip Check twice daily. DX: E11.65 One touch ultra Blue 100 each 0  . losartan (COZAAR) 100 MG tablet TAKE ONE TABLET BY MOUTH ONCE DAILY 90 tablet 3  . metFORMIN (GLUMETZA) 1000 MG (MOD) 24 hr tablet Take  1,000 mg by mouth daily with breakfast.    . metoprolol tartrate (LOPRESSOR) 25 MG tablet Take 1 tablet (25 mg total) by mouth 2 (two) times daily. 60 tablet 11  . Omega-3 Fatty Acids (FISH OIL) 1000 MG CAPS Take 1,000 mg by mouth 2 (two) times daily.    [name] omeprazole (PRILOSEC) 40 MG capsule Take 1 capsule (40 mg total) by mouth 2 (two) times daily before a meal. 60 capsule 6  . rivaroxaban (XARELTO) 20 MG TABS tablet TAKE ONE TABLET BY MOUTH ONCE DAILY WITH  SUPPER. 90 tablet 3  . TRULICITY 1.5 YH/0.6CB SOPN Inject contents of one(1) pen per week (Patient taking differently: Inject 1.5 mg into the skin every Friday. Inject contents of one(1) pen per week) 4 pen 3  . Vitamin D, Cholecalciferol, 400 UNITS TABS Take 400 Units by mouth daily.    [name] amLODipine (NORVASC) 2.5 MG tablet Take 1 tablet (2.5 mg total) by mouth daily. (Patient not taking: Reported on 09/05/2015) 30 tablet 12   No current facility-administered medications for this visit.    Allergies:   Review of patient's allergies indicates no known allergies.    Social History:  The patient  reports that he quit smoking about 3 years ago. His smoking use included Cigarettes. He has a 11.5 pack-year smoking history. He has never used smokeless tobacco. He reports that he does not drink alcohol or use illicit drugs.   Family History:  The patient's family history includes Diabetes in his father and mother; Heart disease in his father and mother; Hyperlipidemia in his mother; Varicose Veins in his mother. There is no history of Cancer.    ROS:  Please see the history of present illness. All other systems are reviewed and negative.    PHYSICAL EXAM: VS:  BP 142/68 mmHg  Pulse 58  Ht '5\' 11"'  (1.803 m)  Wt 297 lb (134.718 kg)  BMI 41.44 kg/m2 , BMI Body mass index is 41.44 kg/(m^2). GEN: Well nourished, well developed, male in no acute distress HEENT: normal for age  Neck: no JVD, no carotid bruit, no masses Cardiac: RRR; no murmur, no  rubs, or gallops Respiratory:  clear to auscultation bilaterally, normal work of breathing GI: soft, nontender, nondistended, + BS MS: no deformity or atrophy; no edema; distal pulses are 2+ in all 4 extremities; no cords in the left leg are noted, varicosities are present Skin: warm and dry, no rash Neuro:  Strength and sensation are intact Psych: euthymic mood, full affect   EKG:  EKG is ordered today. The ekg ordered today demonstrates sinus rhythm, no acute ischemic changes but he has some nonspecific T-wave changes, normal intervals   Recent Labs: 10/31/2014: ALT 25 08/30/2015: B Natriuretic Peptide  266.5*; BUN 24*; Creatinine, Ser 1.25*; Hemoglobin 14.1; Platelets 255; Potassium 4.2; Sodium 141    Lipid Panel    Component Value Date/Time   CHOL 170 02/08/2015 0838   TRIG 210.0* 02/08/2015 0838   HDL 35.40* 02/08/2015 0838   CHOLHDL 5 02/08/2015 0838   VLDL 42.0* 02/08/2015 0838   LDLCALC 98 10/31/2014 0946   LDLDIRECT 103.0 02/08/2015 0838     Wt Readings from Last 3 Encounters:  09/05/15 297 lb (134.718 kg)  08/30/15 285 lb (129.275 kg)  08/30/15 285 lb 9.6 oz (129.547 kg)     Other studies Reviewed: Additional studies/ records that were reviewed today include: Hospital records and office notes.  ASSESSMENT AND PLAN:  1.  Paroxysmal atrial fibrillation: I advised Mr. Atienza that he would need to be on the metoprolol long-term. His blood pressure is tolerating it and he is having no side effects. We have renewed the prescription.  2. Chronic anticoagulation, Xarelto; CHADS2VASC= This patients CHA2DS2-VASc Score and unadjusted Ischemic Stroke Rate (% per year) is equal to 4.8 % stroke rate/year from a score of 4 Above score calculated as 1 point each if present [CHF, HTN, DM, Vascular=MI/PAD/Aortic Plaque, Age if 65-74, or Male], 2 points each if present [Age > 75, or Stroke/TIA/TE]  Current medicines are reviewed at length with the patient today.  The patient has  concerns regarding medicines. HER concerns were addressed  The following changes have been made:  no change  Labs/ tests ordered today include:   Orders Placed This Encounter  Procedures  . EKG 12-Lead     Disposition:   FU with Dr. Claiborne Billings  Signed, Lenoard Aden  09/05/2015 4:37 PM    [address], [place], [place]  [address] Phone: [phone]; Fax: [phone]

## 2015-09-12 ENCOUNTER — Encounter: Payer: Self-pay | Admitting: Podiatry

## 2015-09-12 ENCOUNTER — Ambulatory Visit (INDEPENDENT_AMBULATORY_CARE_PROVIDER_SITE_OTHER): Payer: BLUE CROSS/BLUE SHIELD | Admitting: Podiatry

## 2015-09-12 VITALS — BP 138/87 | HR 67 | Resp 16

## 2015-09-12 DIAGNOSIS — L600 Ingrowing nail: Secondary | ICD-10-CM | POA: Diagnosis not present

## 2015-09-13 NOTE — Telephone Encounter (Signed)
Dr Oval Linsey SPOKE TO DR Everlene Farrier 08/30/15

## 2015-09-14 NOTE — Progress Notes (Signed)
Subjective:     Patient ID: Tommy Yang, male   DOB: Dec 02, 1950, 65 y.o.   MRN: [id number]  HPI patient presents stating I was concerned about this foot fungus on my left foot that's been present for several months   Review of Systems     Objective:   Physical Exam Neurovascular status intact muscle strength adequate no change in health history with patient having some discoloration left nail that's localized in nature with no proximal edema erythema or indications skin infection    Assessment:     Mycotic infection localized    Plan:     Reviewed condition and recommended topical medication and considerations for laser 1 point in future. Reappoint depending on symptoms

## 2015-09-21 ENCOUNTER — Other Ambulatory Visit (INDEPENDENT_AMBULATORY_CARE_PROVIDER_SITE_OTHER): Payer: BLUE CROSS/BLUE SHIELD

## 2015-09-21 DIAGNOSIS — E118 Type 2 diabetes mellitus with unspecified complications: Secondary | ICD-10-CM | POA: Diagnosis not present

## 2015-09-21 DIAGNOSIS — E1165 Type 2 diabetes mellitus with hyperglycemia: Secondary | ICD-10-CM

## 2015-09-21 LAB — COMPREHENSIVE METABOLIC PANEL
ALT: 12 U/L (ref 0–53)
AST: 10 U/L (ref 0–37)
Albumin: 4.2 g/dL (ref 3.5–5.2)
Alkaline Phosphatase: 90 U/L (ref 39–117)
BUN: 20 mg/dL (ref 6–23)
CO2: 29 mEq/L (ref 19–32)
Calcium: 9.7 mg/dL (ref 8.4–10.5)
Chloride: 105 mEq/L (ref 96–112)
Creatinine, Ser: 1.24 mg/dL (ref 0.40–1.50)
GFR: 62.58 mL/min (ref >60.00)
Glucose, Bld: 112 mg/dL — ABNORMAL HIGH (ref 70–99)
Potassium: 4.3 mEq/L (ref 3.5–5.1)
Sodium: 142 mEq/L (ref 135–145)
Total Bilirubin: 0.6 mg/dL (ref 0.2–1.2)
Total Protein: 7.5 g/dL (ref 6.0–8.3)

## 2015-09-26 ENCOUNTER — Encounter: Payer: Self-pay | Admitting: Endocrinology

## 2015-09-26 ENCOUNTER — Ambulatory Visit: Payer: BLUE CROSS/BLUE SHIELD | Admitting: Endocrinology

## 2015-09-26 ENCOUNTER — Other Ambulatory Visit: Payer: Self-pay | Admitting: *Deleted

## 2015-09-26 ENCOUNTER — Ambulatory Visit (INDEPENDENT_AMBULATORY_CARE_PROVIDER_SITE_OTHER): Payer: BLUE CROSS/BLUE SHIELD | Admitting: Endocrinology

## 2015-09-26 VITALS — BP 124/70 | HR 68 | Temp 98.1°F | Resp 16 | Ht 71.0 in | Wt 296.6 lb

## 2015-09-26 DIAGNOSIS — E1165 Type 2 diabetes mellitus with hyperglycemia: Secondary | ICD-10-CM | POA: Diagnosis not present

## 2015-09-26 MED ORDER — METFORMIN HCL ER (MOD) 1000 MG PO TB24: 2000.0000 mg | ORAL_TABLET | Freq: Every day | ORAL | Status: DC

## 2015-09-26 MED ORDER — METFORMIN HCL ER 500 MG PO TB24: 500.0000 mg | ORAL_TABLET | Freq: Every day | ORAL | Status: DC

## 2015-09-26 NOTE — Patient Instructions (Signed)
Check blood sugars on waking up 2-3  times a week Also check blood sugars about 2 hours after a meal and do this after different meals by rotation  Recommended blood sugar levels on waking up is 90-130 and about 2 hours after meal is 130-160  Please bring your blood sugar monitor to each visit, thank you  Metformin 2x daily  May take 2-3 Glipizide daily with steroid shot  Stop glipizide if sugars get <80  Walk daily

## 2015-09-26 NOTE — Progress Notes (Signed)
Patient ID: Tommy Yang, male   DOB: [birth year], 65 y.o.   MRN: [id number]           Reason for Appointment: Follow-up for Type 2 Diabetes  Referring physician: Daub  History of Present Illness:          Diagnosis: Type 2 diabetes mellitus, date of diagnosis: 05/2013       Past history:   He apparently had significant hyperglycemia in 2014 when seen in the urgent care center but did not establish with a PCP for control. He was started on treatment for his diabetes in 10/2013 and he was initially treated with metformin and Januvia A few months later he was also given glipizide ER to help his control when his A1c had gone up to 10.9 He says he was also seen by dietitian but no record available of this. Subsequently his blood sugar control had improved with A1c down to 6.8 in 9/15  Recent history:   On his consultation in 3/16 he was having high blood sugars and weight gain; A1c was 9.1% despite taking glipizide ER, Januvia and metformin.  With taking Trulicity instead of Januvia he has had progressive improvement in his glucose control A1c is now down to 6.2% but he is still having difficulty with losing weight In 12/16 he was started on Invokana 100 mg daily and his glipizide reduced from 10 mg down to 2.5 mg However by mistake he is taking only 1 metformin a day instead of 2000 mg  Current blood sugar patterns and problems identified:  He has had some higher blood sugars at times, occasionally with eating desserts  Overall blood sugars are slightly higher than the last visit  He has not been able to exercise or walking because of shortness of breath and recent cardiac arrhythmia  However he has lost about 7 pounds since last visit  His blood sugar was high since last night with taking a steroid injection   mostly when he gets steroid injections including about 2-3 weeks ago  He says he gains weight when he gets steroids and has difficulty with weight loss anyway  He is  trying to do some walking   Fasting blood sugars are fairly good and averaging about 135 and as low as 103         Oral hypoglycemic drugs the patient is taking are: Glipizide ER 2.5 mg in am, metformin 1 g a.m. and 1.5 g p.m. Side effects from medications have been: None Compliance with the medical regimen: Good  Hypoglycemia: None recently    Glucose monitoring:  done 1-2 times a day         Glucometer: One Touch.      Blood Glucose readings by   Mean values apply above for all meters except median for One Touch  PRE-MEAL Fasting Lunch  afternoon   8-9 PM  Overall  Glucose range:  103-219   114-188   109-205   124-193    Mean/median:  135     141 +/-27     Self-care: The diet that the patient has been following is: tries to limit starches and fats .     Meals: 3 meals per day. Breakfast is sometimes cereal, frequently having soup for lunch           Exercise:  previously walking up to 1 mile, 5 days a week in the mornings, recently less         Dietician visit, most recent:?  2015 .               Weight history:    Wt Readings from Last 3 Encounters:  09/26/15 296 lb 9.6 oz (134.537 kg)  09/05/15 297 lb (134.718 kg)  08/30/15 285 lb (129.275 kg)    Glycemic control:   Lab Results  Component Value Date   HGBA1C 6.2 08/15/2015   HGBA1C 6.6 05/17/2015   HGBA1C 7.6* 02/08/2015   Lab Results  Component Value Date   MICROALBUR 9.9* 04/05/2015   LDLCALC 98 10/31/2014   CREATININE 1.24 09/21/2015    Appointment on 09/21/2015  Component Date Value Ref Range Status  . Sodium 09/21/2015 142  135 - 145 mEq/L Final  . Potassium 09/21/2015 4.3  3.5 - 5.1 mEq/L Final  . Chloride 09/21/2015 105  96 - 112 mEq/L Final  . CO2 09/21/2015 29  19 - 32 mEq/L Final  . Glucose, Bld 09/21/2015 112* 70 - 99 mg/dL Final  . BUN 09/21/2015 20  6 - 23 mg/dL Final  . Creatinine, Ser 09/21/2015 1.24  0.40 - 1.50 mg/dL Final  . Total Bilirubin 09/21/2015 0.6  0.2 - 1.2 mg/dL Final  .  Alkaline Phosphatase 09/21/2015 90  39 - 117 U/L Final  . AST 09/21/2015 10  0 - 37 U/L Final  . ALT 09/21/2015 12  0 - 53 U/L Final  . Total Protein 09/21/2015 7.5  6.0 - 8.3 g/dL Final  . Albumin 09/21/2015 4.2  3.5 - 5.2 g/dL Final  . Calcium 09/21/2015 9.7  8.4 - 10.5 mg/dL Final  . GFR 09/21/2015 62.58  >60.00 mL/min Final        Medication List       This list is accurate as of: 09/26/15  9:17 AM.  Always use your most recent med list.               atorvastatin 40 MG tablet  Commonly known as:  LIPITOR  TAKE ONE TABLET BY MOUTH ONCE DAILY.     blood glucose meter kit and supplies Kit  Test blood sugar daily as directed. Dx code: E11.9     canagliflozin 100 MG Tabs tablet  Commonly known as:  INVOKANA  1 tablet before breakfast     CINNAMON PO  Take 1,000 mg by mouth daily.     Fish Oil 1000 MG Caps  Take 1,000 mg by mouth 2 (two) times daily.     glipiZIDE 2.5 MG 24 hr tablet  Commonly known as:  GLUCOTROL XL  Take 1 tablet (2.5 mg total) by mouth daily with breakfast.     glucose blood test strip  Check twice daily. DX: E11.65 One touch ultra Blue     losartan 100 MG tablet  Commonly known as:  COZAAR  TAKE ONE TABLET BY MOUTH ONCE DAILY     metFORMIN 1000 MG (MOD) 24 hr tablet  Commonly known as:  GLUMETZA  Take 2 tablets (2,000 mg total) by mouth daily with breakfast.     metoprolol tartrate 25 MG tablet  Commonly known as:  LOPRESSOR  Take 1 tablet (25 mg total) by mouth 2 (two) times daily.     omeprazole 40 MG capsule  Commonly known as:  PRILOSEC  Take 1 capsule (40 mg total) by mouth 2 (two) times daily before a meal.     rivaroxaban 20 MG Tabs tablet  Commonly known as:  XARELTO  TAKE ONE TABLET BY MOUTH ONCE DAILY WITH  SUPPER.  TRULICITY 1.5 OJ/5.0KX Sopn  Generic drug:  Dulaglutide  Inject contents of one(1) pen per week     Vitamin D (Cholecalciferol) 400 units Tabs  Take 400 Units by mouth daily.        Allergies: No  Known Allergies  Past Medical History  Diagnosis Date  . Hypertension   . Diabetes mellitus without complication ([place])   . DVT (deep venous thrombosis) ([facility])     left leg.  [name] GERD (gastroesophageal reflux disease)   . Hypercholesterolemia   . Prostate cancer ([place]) 12/06/13    gleason 4+3=7, 6/12 cores positive. seed implant and radiation    Past Surgical History  Procedure Laterality Date  . Appendectomy    . Spine surgery   1964 and 1967    Tourette'ssyndrome  . Prostate biopsy  12/06/13    Gleason 4+3=7, volume 35 gm  . Knee surgery      right knee orthoscopic  . Tonsillectomy    . Colonoscopy with propofol N/A 08/02/2015    Procedure: COLONOSCOPY WITH PROPOFOL w/ APC;  Surgeon: [name], MD;  Location: [facility];  Service: Endoscopy;  Laterality: N/A;  . Esophagogastroduodenoscopy (egd) with propofol N/A 08/02/2015    Procedure: ESOPHAGOGASTRODUODENOSCOPY (EGD) WITH PROPOFOL/ possible dilation.;  Surgeon: [name], MD;  Location: [facility];  Service: Endoscopy;  Laterality: N/A;  . Balloon dilation N/A 08/02/2015    Procedure: BALLOON DILATION;  Surgeon: [name], MD;  Location: [facility];  Service: Endoscopy;  Laterality: N/A;    Family History  Problem Relation Age of Onset  . Heart disease Mother     before age 47  . Diabetes Mother   . Hyperlipidemia Mother   . Varicose Veins Mother   . Heart disease Father   . Diabetes Father   . Cancer Neg Hx     Social History:  reports that he quit smoking about 3 years ago. His smoking use included Cigarettes. He has a 11.5 pack-year smoking history. He has never used smokeless tobacco. He reports that he does not drink alcohol or use illicit drugs.    Review of Systems         Lipids: he has had significant hyperlipidemia and has been treated with Lipitor 40 mg daily; no CAD       Lab Results  Component Value Date   CHOL 170 02/08/2015   HDL 35.40* 02/08/2015   LDLCALC 98 10/31/2014    LDLDIRECT 103.0 02/08/2015   TRIG 210.0* 02/08/2015   CHOLHDL 5 02/08/2015                   The blood pressure has been treated with losartan 100 mg and low-dose metoprolol, followed by PCP and cardiologist   LABS:  Appointment on 09/21/2015  Component Date Value Ref Range Status  . Sodium 09/21/2015 142  135 - 145 mEq/L Final  . Potassium 09/21/2015 4.3  3.5 - 5.1 mEq/L Final  . Chloride 09/21/2015 105  96 - 112 mEq/L Final  . CO2 09/21/2015 29  19 - 32 mEq/L Final  . Glucose, Bld 09/21/2015 112* 70 - 99 mg/dL Final  . BUN 09/21/2015 20  6 - 23 mg/dL Final  . Creatinine, Ser 09/21/2015 1.24  0.40 - 1.50 mg/dL Final  . Total Bilirubin 09/21/2015 0.6  0.2 - 1.2 mg/dL Final  . Alkaline Phosphatase 09/21/2015 90  39 - 117 U/L Final  . AST 09/21/2015 10  0 - 37 U/L Final  .  ALT 09/21/2015 12  0 - 53 U/L Final  . Total Protein 09/21/2015 7.5  6.0 - 8.3 g/dL Final  . Albumin 09/21/2015 4.2  3.5 - 5.2 g/dL Final  . Calcium 09/21/2015 9.7  8.4 - 10.5 mg/dL Final  . GFR 09/21/2015 62.58  >60.00 mL/min Final    Physical Examination:  BP 124/70 mmHg  Pulse 68  Temp(Src) 98.1 F (36.7 C)  Resp 16  Ht '5\' 11"'  (1.803 m)  Wt 296 lb 9.6 oz (134.537 kg)  BMI 41.39 kg/m2  SpO2 93%          ASSESSMENT:   Diabetes type 2, uncontrolled   See history of present illness for detailed discussion of his current management, blood sugar patterns and problems identified    Currently on a regimen of 3.7ND Trulicity along with glipizide ER 2.5 mg daily, Invokana 100 mg and metformin 1.0 g daily. His blood sugars have increased slightly but his glipizide has been reduced significantly He is however not able to exercise and has not been consistent with diet since his last visit He misunderstood and is taking only half of his metformin dose  HYPERTENSION: Well controlled, only on losartan  PLAN:   Increased metformin to twice a day  If blood pressure is relatively lower May consider reducing  losartan  Since creatinine is slightly higher than before will not increase Invokana as yet  Start walking regularly  May temporarily increase glipizide when he has high sugars with steroids   Patient Instructions  Check blood sugars on waking up 2-3  times a week Also check blood sugars about 2 hours after a meal and do this after different meals by rotation  Recommended blood sugar levels on waking up is 90-130 and about 2 hours after meal is 130-160  Please bring your blood sugar monitor to each visit, thank you  Metformin 2x daily  May take 2-3 Glipizide daily with steroid shot  Stop glipizide if sugars get <80  Walk daily       Aleeha Boline 09/26/2015, 9:17 AM   Note: This office note was prepared with Estate agent. Any transcriptional errors that result from this process are unintentional.

## 2015-09-27 ENCOUNTER — Other Ambulatory Visit: Payer: Self-pay | Admitting: *Deleted

## 2015-09-27 ENCOUNTER — Telehealth: Payer: Self-pay | Admitting: Endocrinology

## 2015-09-27 NOTE — Telephone Encounter (Signed)
Seems like the rx for the metformin rx is wrong pt thought he is to take one tab in AM and one in PM not 4 at one time daily

## 2015-09-27 NOTE — Telephone Encounter (Signed)
I spoke with Mr. Anelli, he was concerned because he said the 500 mg tablets looked exactly the same as the 1,000mg  and he was concerned about taking to many, I advised him to take both medications back to the pharmacy and have them look at both to make sure he did receive the correct dose.

## 2015-10-01 ENCOUNTER — Other Ambulatory Visit: Payer: Self-pay | Admitting: *Deleted

## 2015-10-01 MED ORDER — GLUCOSE BLOOD VI STRP: ORAL_STRIP | Status: DC

## 2015-10-04 ENCOUNTER — Ambulatory Visit (INDEPENDENT_AMBULATORY_CARE_PROVIDER_SITE_OTHER): Payer: BLUE CROSS/BLUE SHIELD | Admitting: Emergency Medicine

## 2015-10-04 ENCOUNTER — Encounter: Payer: Self-pay | Admitting: Emergency Medicine

## 2015-10-04 VITALS — BP 113/71 | HR 60 | Temp 97.8°F | Resp 16 | Wt 291.0 lb

## 2015-10-04 DIAGNOSIS — R911 Solitary pulmonary nodule: Secondary | ICD-10-CM | POA: Diagnosis not present

## 2015-10-04 NOTE — Progress Notes (Signed)
By signing my name below, I, Moises Blood, attest that this documentation has been prepared under the direction and in the presence of [name], MD. Electronically Signed: Moises Blood, [name]. 10/04/2015 , 10:47 AM .  Patient was seen in room 21 .  Chief Complaint:  Chief Complaint  Patient presents with  . hospital follow up  . chest xray    HPI: Tommy Yang is a 65 y.o. male who reports to [facility] today for hospital follow up for chest xray. He's concerned after having an xray with the findings stating "subtle nodular density noted in the lateral aspect of the right lung base, which is new compared to prior examinations."   Medication His endocrinologist lowered his glipizide down to 2.38m.   Sleep Study He cancelled the sleep study because he notes not doing well with masks when sleeping. He states that he knows having sleep apnea.   Past Medical History  Diagnosis Date  . Hypertension   . Diabetes mellitus without complication ([facility]   . DVT (deep venous thrombosis) (H[facility])     left leg.  .Marland KitchenGERD (gastroesophageal reflux disease)   . Hypercholesterolemia   . Prostate cancer ([facility] 12/06/13    gleason 4+3=7, 6/12 cores positive. seed implant and radiation   Past Surgical History  Procedure Laterality Date  . Appendectomy    . Spine surgery   1964 and 1967    Tourette'ssyndrome  . Prostate biopsy  12/06/13    Gleason 4+3=7, volume 35 gm  . Knee surgery      right knee orthoscopic  . Tonsillectomy    . Colonoscopy with propofol N/A 08/02/2015    Procedure: COLONOSCOPY WITH PROPOFOL w/ APC;  Surgeon: DMilus Banister MD;  [facility];  Service: Endoscopy;  Laterality: N/A;  . Esophagogastroduodenoscopy (egd) with propofol N/A 08/02/2015    Procedure: ESOPHAGOGASTRODUODENOSCOPY (EGD) WITH PROPOFOL/ possible dilation.;  Surgeon: DMilus Banister MD;  [facility];  Service: Endoscopy;  Laterality: N/A;  . Balloon dilation N/A 08/02/2015   Procedure: BALLOON DILATION;  Surgeon: DMilus Banister MD;  [facility];  Service: Endoscopy;  Laterality: N/A;   Social History   Social History  . Marital Status: Married    Spouse Name: N/A  . Number of Children: 1  . Years of Education: N/A   Occupational History  .     Social History Main Topics  . Smoking status: Former Smoker -- 0.50 packs/day for 23 years    Types: Cigarettes    Quit date: 08/01/2012  . Smokeless tobacco: Never Used  . Alcohol Use: No  . Drug Use: No  . Sexual Activity: Yes    Birth Control/ Protection: Abstinence   Other Topics Concern  . None   Social History Narrative   Family History  Problem Relation Age of Onset  . Heart disease Mother     before age 63 . Diabetes Mother   . Hyperlipidemia Mother   . Varicose Veins Mother   . Heart disease Father   . Diabetes Father   . Cancer Neg Hx    No Known Allergies Prior to Admission medications   Medication Sig Start Date End Date Taking? Authorizing Provider  atorvastatin (LIPITOR) 40 MG tablet TAKE ONE TABLET BY MOUTH ONCE DAILY. 11/13/14   SDarlyne Russian MD  blood glucose meter kit and supplies KIT Test blood sugar daily as directed. Dx code: E11.9 01/23/15   SDarlyne Russian MD  canagliflozin (  INVOKANA) 100 MG TABS tablet 1 tablet before breakfast 08/15/15   Elayne Snare, MD  CINNAMON PO Take 1,000 mg by mouth daily.    Historical Provider, MD  glipiZIDE (GLUCOTROL XL) 2.5 MG 24 hr tablet Take 1 tablet (2.5 mg total) by mouth daily with breakfast. 08/15/15   Elayne Snare, MD  glucose blood test strip Check twice daily. DX: E11.65 One touch ultra Blue 10/01/15   Elayne Snare, MD  losartan (COZAAR) 100 MG tablet TAKE ONE TABLET BY MOUTH ONCE DAILY 11/13/14   Darlyne Russian, MD  metFORMIN (GLUCOPHAGE XR) 500 MG 24 hr tablet Take 1 tablet (500 mg total) by mouth daily with breakfast. Take 4 tablets daily Patient taking differently: Take 500 mg by mouth daily with breakfast. Take 2 tablets twice  daily 09/26/15   Elayne Snare, MD  metoprolol tartrate (LOPRESSOR) 25 MG tablet Take 1 tablet (25 mg total) by mouth 2 (two) times daily. 09/05/15   Evelene Croon Barrett, PA-C  Omega-3 Fatty Acids (FISH OIL) 1000 MG CAPS Take 1,000 mg by mouth 2 (two) times daily.    Historical Provider, MD  omeprazole (PRILOSEC) 40 MG capsule Take 1 capsule (40 mg total) by mouth 2 (two) times daily before a meal. 08/02/15   [name], MD  rivaroxaban (XARELTO) 20 MG TABS tablet TAKE ONE TABLET BY MOUTH ONCE DAILY WITH  SUPPER. 11/13/14   Darlyne Russian, MD  TRULICITY 1.5 DT/2.6ZT SOPN Inject contents of one(1) pen per week Patient taking differently: Inject 1.5 mg into the skin every Friday. Inject contents of one(1) pen per week 02/13/15   Elayne Snare, MD  Vitamin D, Cholecalciferol, 400 UNITS TABS Take 400 Units by mouth daily.    Historical Provider, MD     ROS:  Constitutional: negative for fever, chills, night sweats, weight changes, or fatigue  HEENT: negative for vision changes, hearing loss, congestion, rhinorrhea, ST, epistaxis, or sinus pressure Cardiovascular: negative for chest pain or palpitations Respiratory: negative for hemoptysis, wheezing, shortness of breath, or cough Abdominal: negative for abdominal pain, nausea, vomiting, diarrhea, or constipation Dermatological: negative for rash Neurologic: negative for headache, dizziness, or syncope All other systems reviewed and are otherwise negative with the exception to those above and in the HPI.  PHYSICAL EXAM: Filed Vitals:   10/04/15 1012  BP: 113/71  Pulse: 60  Temp: 97.8 F (36.6 C)  Resp: 16   Body mass index is 40.6 kg/(m^2).   General: Alert, no acute distress HEENT:  Normocephalic, atraumatic, oropharynx patent. Eye: Juliette M[facility] Cardiovascular:  Irregular heart rhythm consistent with his atrial fib, regular rate, no rubs murmurs or gallops.  No Carotid bruits, radial pulse intact. No pedal edema.  Respiratory: Clear to  auscultation bilaterally.  No wheezes, rales, or rhonchi.  No cyanosis, no use of accessory musculature Abdominal: No organomegaly, abdomen is soft and non-tender, positive bowel sounds. No masses. Musculoskeletal: Gait intact. No edema, tenderness Skin: No rashes. Neurologic: Facial musculature symmetric. Psychiatric: Patient acts appropriately throughout our interaction.  Lymphatic: No cervical or submandibular lymphadenopathy Genitourinary/Anorectal: No acute findings   LABS:   EKG/XRAY:   Primary read interpreted by Dr. Everlene Farrier at [facility].   ASSESSMENT/PLAN:  Patient had chest x-ray in December which showed a suspicious abnormal area right lower lobe with possible nodule. He is here today to discuss that. We'll proceed with CT chest no contrast for evaluation.[name] He also declines a sleep study . He was scheduled for this previously but states since he would  not be willing to do a CPAP machine there is no reason for him to have the study done.I personally performed the services described in this documentation, which was scribed in my presence. The recorded information has been reviewed and is accurate.  Gross sideeffects, risk and benefits, and alternatives of medications d/w patient. Patient is aware that all medications have potential sideeffects and we are unable to predict every sideeffect or drug-drug interaction that may occur.  [name] MD 10/04/2015 10:47 AM

## 2015-10-05 ENCOUNTER — Telehealth: Payer: Self-pay

## 2015-10-05 NOTE — Telephone Encounter (Signed)
Pt has questions about his scan of his lungs -this is all he would state   Best number [phone]

## 2015-10-08 NOTE — Telephone Encounter (Signed)
Dr. Everlene Farrier  Please see previous message, I believe he saw you

## 2015-10-08 NOTE — Telephone Encounter (Signed)
Call patient the CT of his chest has been ordered. He should have heard from the x-ray facility. Please ask if he has other questions.

## 2015-10-10 NOTE — Telephone Encounter (Signed)
[facility] attempted to contact pt on 10/05/15. I called and spoke to pt. He said he cannot have CT done at this time because he has not met his deductible.

## 2015-10-10 NOTE — Telephone Encounter (Signed)
Pt said he has about 2,600 to go until he meets his deductible. Pt isn't even sure if he will meet his deductible this year. Pt is adamant on not having CT until deductible is met. 

## 2015-10-10 NOTE — Telephone Encounter (Signed)
Call patient and advised him I would like to have his CT done is soon as he is financially able. Please have him contact me when he can have his scan done.

## 2015-10-24 ENCOUNTER — Telehealth: Payer: Self-pay | Admitting: Endocrinology

## 2015-10-24 NOTE — Telephone Encounter (Signed)
Patient ask you to call him about his medication.

## 2015-10-25 ENCOUNTER — Ambulatory Visit: Payer: BLUE CROSS/BLUE SHIELD | Admitting: Emergency Medicine

## 2015-10-25 NOTE — Telephone Encounter (Signed)
Left message for patient to return phone call.  

## 2015-10-26 ENCOUNTER — Telehealth: Payer: Self-pay | Admitting: Endocrinology

## 2015-10-26 MED ORDER — GLIPIZIDE ER 2.5 MG PO TB24: 2.5000 mg | ORAL_TABLET | Freq: Every day | ORAL | Status: DC

## 2015-10-26 MED ORDER — CANAGLIFLOZIN 100 MG PO TABS: ORAL_TABLET | ORAL | Status: DC

## 2015-10-26 MED ORDER — TRULICITY 1.5 MG/0.5ML ~~LOC~~ SOAJ: SUBCUTANEOUS | Status: DC

## 2015-10-26 MED ORDER — METFORMIN HCL ER 500 MG PO TB24: 500.0000 mg | ORAL_TABLET | Freq: Every day | ORAL | Status: DC

## 2015-10-26 NOTE — Telephone Encounter (Signed)
Scripts have been sent this has been taken care of

## 2015-10-26 NOTE — Telephone Encounter (Signed)
Done , both sent to pharmacies

## 2015-10-26 NOTE — Telephone Encounter (Signed)
Patient called stating that he is currently out of his Rx  Can we send a 30 day supply to [place]    Rx: Invokana   [facility] the [facility]'s   Thank you

## 2015-11-02 ENCOUNTER — Other Ambulatory Visit: Payer: Self-pay

## 2015-11-05 ENCOUNTER — Telehealth: Payer: Self-pay | Admitting: Cardiovascular Disease

## 2015-11-05 ENCOUNTER — Other Ambulatory Visit: Payer: Self-pay

## 2015-11-05 MED ORDER — AMLODIPINE BESYLATE 2.5 MG PO TABS: 2.5000 mg | ORAL_TABLET | Freq: Every day | ORAL | Status: DC

## 2015-11-05 MED ORDER — METOPROLOL TARTRATE 25 MG PO TABS: 25.0000 mg | ORAL_TABLET | Freq: Two times a day (BID) | ORAL | Status: DC

## 2015-11-05 MED ORDER — ATORVASTATIN CALCIUM 40 MG PO TABS: ORAL_TABLET | ORAL | Status: DC

## 2015-11-05 MED ORDER — LOSARTAN POTASSIUM 100 MG PO TABS
ORAL_TABLET | ORAL | Status: DC
Start: 2015-11-05 — End: 2016-04-15

## 2015-11-05 NOTE — Telephone Encounter (Signed)
meds refilled 

## 2015-11-05 NOTE — Telephone Encounter (Signed)
New message       *STAT* If patient is at the pharmacy, call can be transferred to refill team.   1. Which medications need to be refilled? (please list name of each medication and dose if known) metoprolol 25mg  and amlodipine 2.5mg    2. Which pharmacy/location (including street and city if local pharmacy) is [facility] 3. Do they need a 30 day or 90 day supply? 90 day supply

## 2015-11-07 ENCOUNTER — Other Ambulatory Visit: Payer: Self-pay

## 2015-11-07 MED ORDER — OMEPRAZOLE 40 MG PO CPDR: 40.0000 mg | DELAYED_RELEASE_CAPSULE | Freq: Two times a day (BID) | ORAL | Status: DC

## 2015-11-14 ENCOUNTER — Telehealth: Payer: Self-pay

## 2015-11-14 ENCOUNTER — Other Ambulatory Visit: Payer: Self-pay | Admitting: Emergency Medicine

## 2015-11-14 NOTE — Telephone Encounter (Signed)
Pt is checking on status of a refill request thru pharmacy that was sent over on 11/02/15  Best number [phone] pt has one pill left

## 2015-11-14 NOTE — Telephone Encounter (Signed)
Pt is checking on status of a refill request from pharmacy on 11/02/15

## 2015-11-16 NOTE — Telephone Encounter (Signed)
SPoke with pt, he received the medication.

## 2015-12-03 ENCOUNTER — Other Ambulatory Visit: Payer: BLUE CROSS/BLUE SHIELD

## 2015-12-04 ENCOUNTER — Other Ambulatory Visit: Payer: BLUE CROSS/BLUE SHIELD

## 2015-12-05 ENCOUNTER — Ambulatory Visit: Payer: BLUE CROSS/BLUE SHIELD | Admitting: Endocrinology

## 2015-12-06 ENCOUNTER — Other Ambulatory Visit (INDEPENDENT_AMBULATORY_CARE_PROVIDER_SITE_OTHER): Payer: BLUE CROSS/BLUE SHIELD

## 2015-12-06 DIAGNOSIS — E1165 Type 2 diabetes mellitus with hyperglycemia: Secondary | ICD-10-CM | POA: Diagnosis not present

## 2015-12-06 LAB — BASIC METABOLIC PANEL
BUN: 21 mg/dL (ref 6–23)
CO2: 28 mEq/L (ref 19–32)
Calcium: 9.5 mg/dL (ref 8.4–10.5)
Chloride: 105 mEq/L (ref 96–112)
Creatinine, Ser: 1.09 mg/dL (ref 0.40–1.50)
GFR: 72.57 mL/min (ref >60.00)
Glucose, Bld: 113 mg/dL — ABNORMAL HIGH (ref 70–99)
Potassium: 4.2 mEq/L (ref 3.5–5.1)
Sodium: 141 mEq/L (ref 135–145)

## 2015-12-06 LAB — MICROALBUMIN / CREATININE URINE RATIO
Creatinine,U: 144.3 mg/dL
Microalb Creat Ratio: 5.5 mg/g (ref 0.0–30.0)
Microalb, Ur: 8 mg/dL — ABNORMAL HIGH (ref 0.0–1.9)

## 2015-12-06 LAB — HEMOGLOBIN A1C: Hgb A1c MFr Bld: 6.6 % — ABNORMAL HIGH (ref 4.6–6.5)

## 2015-12-07 ENCOUNTER — Encounter: Payer: Self-pay | Admitting: Endocrinology

## 2015-12-07 ENCOUNTER — Ambulatory Visit (INDEPENDENT_AMBULATORY_CARE_PROVIDER_SITE_OTHER): Payer: BLUE CROSS/BLUE SHIELD | Admitting: Endocrinology

## 2015-12-07 ENCOUNTER — Other Ambulatory Visit: Payer: Self-pay | Admitting: *Deleted

## 2015-12-07 VITALS — BP 124/82 | HR 58 | Temp 98.4°F | Resp 16 | Ht 71.0 in | Wt 296.2 lb

## 2015-12-07 DIAGNOSIS — E119 Type 2 diabetes mellitus without complications: Secondary | ICD-10-CM | POA: Diagnosis not present

## 2015-12-07 MED ORDER — TRULICITY 1.5 MG/0.5ML ~~LOC~~ SOAJ: SUBCUTANEOUS | Status: DC

## 2015-12-07 NOTE — Patient Instructions (Addendum)
Check blood sugars on waking up 2-3 times a week Also check blood sugars about 2 hours after a meal and do this after different meals by rotation  Recommended blood sugar levels on waking up is 90-130 and about 2 hours after meal is 130-160  Please bring your blood sugar monitor to each visit, thank you  

## 2015-12-07 NOTE — Progress Notes (Signed)
Patient ID: Tommy Yang, male   DOB: 1951-05-06, 65 y.o.   MRN: [id number]           Reason for Appointment: Follow-up for Type 2 Diabetes  Referring physician: Daub  History of Present Illness:          Diagnosis: Type 2 diabetes mellitus, date of diagnosis: 05/2013       Past history:   He apparently had significant hyperglycemia in 2014 when seen in the urgent care center but did not establish with a PCP for control. He was started on treatment for his diabetes in 10/2013 and he was initially treated with metformin and Januvia A few months later he was also given glipizide ER to help his control when his A1c had gone up to 10.9 He says he was also seen by dietitian but no record available of this. Subsequently his blood sugar control had improved with A1c down to 6.8 in 9/15  Recent history:   On his consultation in 3/16 he was having high blood sugars and weight gain; A1c was 9.1% despite taking glipizide ER, Januvia and metformin.  With taking Trulicity instead of Januvia he has had consistent improvement in his glucose control  Although A1c is slightly higher at 6.6 his recent home readings are looking good Metformin was increased to 2000 mg on his last visit  Current blood sugar patterns and problems identified:  He has had much less fluctuation in his blood sugars  He is trying to check blood sugars at various times including some after meals  He thinks certain foods and certain types of carbohydrates make his sugars go higher and is trying to be more consistent  Blood sugars are  more stable since he has not had any steroids in the last few weeks  Still having difficulty losing weight  Currently not able to exercise because of dyspnea on exertion   Although he is compliant with all his medications he is asking about reducing his medication regimen         Oral hypoglycemic drugs the patient is taking are: Glipizide ER 2.5 mg in am, metformin 1 g a.m. and 1.5  g p.m. Side effects from medications have been: None Compliance with the medical regimen: Good  Hypoglycemia: None recently    Glucose monitoring:  done 1-2 times a day         Glucometer: One Touch.      Blood Glucose readings by monitor download:  Mean values apply above for all meters except median for One Touch  PRE-MEAL Fasting Lunch Dinner PCS  Overall  Glucose range: 96-142  100-145  104-122  122-158    Mean/median: 118     117    Self-care: The diet that the patient has been following is: tries to limit starches and fats .     Meals: 3 meals per day. Breakfast is sometimes cereal, frequently having soup for lunch           Exercise:  previously walking up to 1 mile, 5 days a week in the mornings, recently less         Dietician visit, most recent:?  2015 .               Weight history:    Wt Readings from Last 3 Encounters:  12/07/15 296 lb 3.2 oz (134.355 kg)  10/04/15 291 lb (131.997 kg)  09/26/15 296 lb 9.6 oz (134.537 kg)    Glycemic control:   Lab  Results  Component Value Date   HGBA1C 6.6* 12/06/2015   HGBA1C 6.2 08/15/2015   HGBA1C 6.6 05/17/2015   Lab Results  Component Value Date   MICROALBUR 8.0* 12/06/2015   LDLCALC 98 10/31/2014   CREATININE 1.09 12/06/2015    Lab on 12/06/2015  Component Date Value Ref Range Status  . Hgb A1c MFr Bld 12/06/2015 6.6* 4.6 - 6.5 % Final   Glycemic Control Guidelines for People with Diabetes:Non Diabetic:  <6%Goal of Therapy: <7%Additional Action Suggested:  >8%   . Sodium 12/06/2015 141  135 - 145 mEq/L Final  . Potassium 12/06/2015 4.2  3.5 - 5.1 mEq/L Final  . Chloride 12/06/2015 105  96 - 112 mEq/L Final  . CO2 12/06/2015 28  19 - 32 mEq/L Final  . Glucose, Bld 12/06/2015 113* 70 - 99 mg/dL Final  . BUN 12/06/2015 21  6 - 23 mg/dL Final  . Creatinine, Ser 12/06/2015 1.09  0.40 - 1.50 mg/dL Final  . Calcium 12/06/2015 9.5  8.4 - 10.5 mg/dL Final  . GFR 12/06/2015 72.57  >60.00 mL/min Final  . Microalb, Ur  12/06/2015 8.0* 0.0 - 1.9 mg/dL Final  . Creatinine,U 12/06/2015 144.3   Final  . Microalb Creat Ratio 12/06/2015 5.5  0.0 - 30.0 mg/g Final        Medication List       This list is accurate as of: 12/07/15 12:39 PM.  Always use your most recent med list.               amLODipine 2.5 MG tablet  Commonly known as:  NORVASC  Take 1 tablet (2.5 mg total) by mouth daily.     atorvastatin 40 MG tablet  Commonly known as:  LIPITOR  TAKE ONE TABLET BY MOUTH ONCE DAILY.     blood glucose meter kit and supplies Kit  Test blood sugar daily as directed. Dx code: E11.9     canagliflozin 100 MG Tabs tablet  Commonly known as:  INVOKANA  1 tablet before breakfast     CINNAMON PO  Take 1,000 mg by mouth daily.     Fish Oil 1000 MG Caps  Take 1,000 mg by mouth 2 (two) times daily.     glipiZIDE 2.5 MG 24 hr tablet  Commonly known as:  GLUCOTROL XL  Take 1 tablet (2.5 mg total) by mouth daily with breakfast.     glucose blood test strip  Check twice daily. DX: E11.65 One touch ultra Blue     losartan 100 MG tablet  Commonly known as:  COZAAR  TAKE ONE TABLET BY MOUTH ONCE DAILY     metFORMIN 500 MG 24 hr tablet  Commonly known as:  GLUCOPHAGE XR  Take 1 tablet (500 mg total) by mouth daily with breakfast. Take 2 tablets twice daily     metoprolol tartrate 25 MG tablet  Commonly known as:  LOPRESSOR  Take 1 tablet (25 mg total) by mouth 2 (two) times daily.     omeprazole 40 MG capsule  Commonly known as:  PRILOSEC  Take 1 capsule (40 mg total) by mouth 2 (two) times daily before a meal.     TRULICITY 1.5 DE/0.8XK Sopn  Generic drug:  Dulaglutide  Inject contents of one(1) pen per week     Vitamin D (Cholecalciferol) 400 units Tabs  Take 400 Units by mouth daily.     XARELTO 20 MG Tabs tablet  Generic drug:  rivaroxaban  TAKE ONE TABLET BY MOUTH  ONCE DAILY WITH SUPPER        Allergies: No Known Allergies  Past Medical History  Diagnosis Date  . Hypertension    . Diabetes mellitus without complication ([place])   . DVT (deep venous thrombosis) (HCC)     left leg.  [name] GERD (gastroesophageal reflux disease)   . Hypercholesterolemia   . Prostate cancer ([place]) 12/06/13    gleason 4+3=7, 6/12 cores positive. seed implant and radiation    Past Surgical History  Procedure Laterality Date  . Appendectomy    . Spine surgery   1964 and 1967    Tourette'ssyndrome  . Prostate biopsy  12/06/13    Gleason 4+3=7, volume 35 gm  . Knee surgery      right knee orthoscopic  . Tonsillectomy    . Colonoscopy with propofol N/A 08/02/2015    Procedure: COLONOSCOPY WITH PROPOFOL w/ APC;  Surgeon: [name], MD;  [facility];  Service: Endoscopy;  Laterality: N/A;  . Esophagogastroduodenoscopy (egd) with propofol N/A 08/02/2015    Procedure: ESOPHAGOGASTRODUODENOSCOPY (EGD) WITH PROPOFOL/ possible dilation.;  Surgeon: [name], MD;  [facility];  Service: Endoscopy;  Laterality: N/A;  . Balloon dilation N/A 08/02/2015    Procedure: BALLOON DILATION;  Surgeon: [name], MD;  [facility];  Service: Endoscopy;  Laterality: N/A;    Family History  Problem Relation Age of Onset  . Heart disease Mother     before age 49  . Diabetes Mother   . Hyperlipidemia Mother   . Varicose Veins Mother   . Heart disease Father   . Diabetes Father   . Cancer Neg Hx     Social History:  reports that he quit smoking about 3 years ago. His smoking use included Cigarettes. He has a 11.5 pack-year smoking history. He has never used smokeless tobacco. He reports that he does not drink alcohol or use illicit drugs.    Review of Systems    He is now having atrial fibrillation and get short of breath with exertion      Lipids: he has had significant hyperlipidemia and has been treated with Lipitor 40 mg daily; no history of CAD       Lab Results  Component Value Date   CHOL 170 02/08/2015   HDL 35.40* 02/08/2015   LDLCALC 98  10/31/2014   LDLDIRECT 103.0 02/08/2015   TRIG 210.0* 02/08/2015   CHOLHDL 5 02/08/2015                   The blood pressure has been treated with losartan 100 mg and low-dose metoprolol, followed by PCP and cardiologist   LABS:  Lab on 12/06/2015  Component Date Value Ref Range Status  . Hgb A1c MFr Bld 12/06/2015 6.6* 4.6 - 6.5 % Final   Glycemic Control Guidelines for People with Diabetes:Non Diabetic:  <6%Goal of Therapy: <7%Additional Action Suggested:  >8%   . Sodium 12/06/2015 141  135 - 145 mEq/L Final  . Potassium 12/06/2015 4.2  3.5 - 5.1 mEq/L Final  . Chloride 12/06/2015 105  96 - 112 mEq/L Final  . CO2 12/06/2015 28  19 - 32 mEq/L Final  . Glucose, Bld 12/06/2015 113* 70 - 99 mg/dL Final  . BUN 12/06/2015 21  6 - 23 mg/dL Final  . Creatinine, Ser 12/06/2015 1.09  0.40 - 1.50 mg/dL Final  . Calcium 12/06/2015 9.5  8.4 - 10.5 mg/dL Final  . GFR 12/06/2015 72.57  >60.00 mL/min  Final  . Microalb, Ur 12/06/2015 8.0* 0.0 - 1.9 mg/dL Final  . Creatinine,U 12/06/2015 144.3   Final  . Microalb Creat Ratio 12/06/2015 5.5  0.0 - 30.0 mg/g Final    Physical Examination:  BP 124/82 mmHg  Pulse 58  Temp(Src) 98.4 F (36.9 C)  Resp 16  Ht '5\' 11"'  (1.803 m)  Wt 296 lb 3.2 oz (134.355 kg)  BMI 41.33 kg/m2  SpO2 94%          ASSESSMENT:   Diabetes type 2, uncontrolled   See history of present illness for detailed discussion of his current management, blood sugar patterns and problems identified    Currently on a regimen of 1.5VV Trulicity along with glipizide ER 2.5 mg daily, Invokana 100 mg and metformin 1.0 g daily. His blood sugars have recently been excellent on his home monitoring Not clear why his A1c is slightly higher although he did have high readings over 200 with steroids about a month ago Currently not able to exercise because of his atrial fibrillation He is trying to be very clear about his diet and monitoring post prandial readings to help identify the  best meal regimen  PLAN:   No change in medications  Encouraged him to start exercising once he has his atrial fibrillation treated  Follow-up in 4 months   Patient Instructions  Check blood sugars on waking up 2-3  times a week Also check blood sugars about 2 hours after a meal and do this after different meals by rotation  Recommended blood sugar levels on waking up is 90-130 and about 2 hours after meal is 130-160  Please bring your blood sugar monitor to each visit, thank you      [facility] 12/07/2015, 12:39 PM   Note: This office note was prepared with Dragon voice recognition system technology. Any transcriptional errors that result from this process are unintentional.

## 2015-12-10 ENCOUNTER — Encounter: Payer: Self-pay | Admitting: Gastroenterology

## 2015-12-10 DIAGNOSIS — C61 Malignant neoplasm of prostate: Secondary | ICD-10-CM | POA: Diagnosis not present

## 2015-12-14 ENCOUNTER — Telehealth: Payer: Self-pay

## 2015-12-14 NOTE — Telephone Encounter (Signed)
[facility] sent a notice that PA for Xarelto is about to expire. Completed PA on covermymeds. Approved through 12/13/16, case # [id number].

## 2015-12-17 DIAGNOSIS — C61 Malignant neoplasm of prostate: Secondary | ICD-10-CM | POA: Diagnosis not present

## 2015-12-17 DIAGNOSIS — Z0000 Encounter for general adult medical examination without abnormal findings: Secondary | ICD-10-CM | POA: Diagnosis not present

## 2015-12-27 ENCOUNTER — Encounter: Payer: Self-pay | Admitting: Cardiovascular Disease

## 2015-12-27 ENCOUNTER — Ambulatory Visit (INDEPENDENT_AMBULATORY_CARE_PROVIDER_SITE_OTHER): Payer: BLUE CROSS/BLUE SHIELD | Admitting: Cardiovascular Disease

## 2015-12-27 VITALS — BP 130/70 | HR 55 | Ht 71.0 in | Wt 296.0 lb

## 2015-12-27 DIAGNOSIS — G478 Other sleep disorders: Secondary | ICD-10-CM

## 2015-12-27 DIAGNOSIS — I480 Paroxysmal atrial fibrillation: Secondary | ICD-10-CM | POA: Diagnosis not present

## 2015-12-27 DIAGNOSIS — Z7901 Long term (current) use of anticoagulants: Secondary | ICD-10-CM | POA: Diagnosis not present

## 2015-12-27 DIAGNOSIS — E785 Hyperlipidemia, unspecified: Secondary | ICD-10-CM

## 2015-12-27 DIAGNOSIS — I82512 Chronic embolism and thrombosis of left femoral vein: Secondary | ICD-10-CM

## 2015-12-27 NOTE — Progress Notes (Signed)
Patient ID: Tommy Yang, male   DOB: [birth year], 65 y.o.   MRN: [id number]     Primary M.D.: Dr. [name]  HPI:  Tommy Yang is a 65 y.o. male who presents for a 9 month follow-up evaluation.   Tommy Yang has a history of hypertension, obesity, lower extremity DVT, diabetes mellitus, and hyperlipidemia. 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 The patient has a history of lower extremity DVT and has been on anticoagulation with Xarelto 20 mg.   He has a history of Tourette's syndrome.  He denies any significant chest pain.  Upon questioning, his sleep, he oftentimes wakes up in his gasping for breath.  He does snore.  He wakes up approximately 2-3 times per night.  In May 2016 he saw Dr. Everlene Farrier with complaints of increased shortness of breath particularly with activity.  He typically notes this when he cuts the grass.  An ECG  revealed new T-wave abnormalities in lead 1 and L, V4 through V6 which was not present on his ECG one year ago.  I saw him as an add-on  On 01/11/2015. An echo Doppler study revealed an ejection fraction of 55-60%.  There was mild focal basal hypertrophy of the septum. Wall motion was normal.  There was mild grade 1 diastolic dysfunction.  A nuclear perfusion study revealed normal myocardial perfusion.  This was a 2 day study due to his obesity.  He did not have ECG changes of ischemia, but did have an occasional PVC noted touring the test.  When I last saw him in July 2016.  He denied any episodes of chest pain.  Further questioning revealed that he was having significant difficulty with sleep, and was experiencing episodes of significant hypersomnolence.  I recommended that he have a sleep study done, but this was never pursued.  He has issues with varicose veins chronically.  He sees Dr. Dwyane Dee for his diabetes mellitus.  He was seen in the emergency room in December 2016 with shortness of  breath and was in atrial fibrillation.  He spontaneously converted to sinus rhythm.  He had been compliant with Xarelto.  He is unaware of any recurrent AF.  He presents for reevaluation.  Past Medical History  Diagnosis Date  . Hypertension   . Diabetes mellitus without complication ([place])   . DVT (deep venous thrombosis) ([facility])     left leg.  [name] GERD (gastroesophageal reflux disease)   . Hypercholesterolemia   . Prostate cancer ([place]) 12/06/13    gleason 4+3=7, 6/12 cores positive. seed implant and radiation    Past Surgical History  Procedure Laterality Date  . Appendectomy    . Spine surgery   1964 and 1967    Tourette'ssyndrome  . Prostate biopsy  12/06/13    Gleason 4+3=7, volume 35 gm  . Knee surgery      right knee orthoscopic  . Tonsillectomy    . Colonoscopy with propofol N/A 08/02/2015    Procedure: COLONOSCOPY WITH PROPOFOL w/ APC;  Surgeon: [name], MD;  Location: [facility];  Service: Endoscopy;  Laterality: N/A;  . Esophagogastroduodenoscopy (egd) with propofol N/A 08/02/2015    Procedure: ESOPHAGOGASTRODUODENOSCOPY (EGD) WITH PROPOFOL/ possible dilation.;  Surgeon: [name], MD;  Location: [facility];  Service: Endoscopy;  Laterality: N/A;  . Balloon dilation N/A 08/02/2015    Procedure: BALLOON DILATION;  Surgeon: [name], MD;  Location:  [facility];  Service: Endoscopy;  Laterality: N/A;    No Known Allergies  Current Outpatient Prescriptions  Medication Sig Dispense Refill  . amLODipine (NORVASC) 2.5 MG tablet Take 1 tablet (2.5 mg total) by mouth daily. 180 tablet 1  . atorvastatin (LIPITOR) 40 MG tablet TAKE ONE TABLET BY MOUTH ONCE DAILY. 90 tablet 0  . blood glucose meter kit and supplies KIT Test blood sugar daily as directed. Dx code: E11.9 1 each 0  . canagliflozin (INVOKANA) 100 MG TABS tablet 1 tablet before breakfast 90 tablet 0  . CINNAMON PO Take 1,000 mg by mouth daily.    . glipiZIDE (GLUCOTROL XL) 2.5 MG 24 hr tablet Take 1  tablet (2.5 mg total) by mouth daily with breakfast. 90 tablet 0  . glucose blood test strip Check twice daily. DX: E11.65 One touch ultra Blue 200 each 1  . losartan (COZAAR) 100 MG tablet TAKE ONE TABLET BY MOUTH ONCE DAILY 90 tablet 0  . metFORMIN (GLUCOPHAGE XR) 500 MG 24 hr tablet Take 1 tablet (500 mg total) by mouth daily with breakfast. Take 2 tablets twice daily 120 tablet 2  . metoprolol tartrate (LOPRESSOR) 25 MG tablet Take 1 tablet (25 mg total) by mouth 2 (two) times daily. 180 tablet 1  . Omega-3 Fatty Acids (FISH OIL) 1000 MG CAPS Take 1,000 mg by mouth 2 (two) times daily.    . omeprazole (PRILOSEC) 40 MG capsule Take 1 capsule (40 mg total) by mouth 2 (two) times daily before a meal. 180 capsule 3  . TRULICITY 1.5 MG/0.5ML SOPN Inject contents of one(1) pen per week 12 pen 1  . Vitamin D, Cholecalciferol, 400 UNITS TABS Take 400 Units by mouth daily.    . XARELTO 20 MG TABS tablet TAKE ONE TABLET BY MOUTH ONCE DAILY WITH SUPPER 90 tablet 0   No current facility-administered medications for this visit.    Social he is married for 30 years.  He completed college at [employer].  He is retired.  He smoked for 20 years and quit in 2013.  He does drink  occasional alcohol.  Family History  Problem Relation Age of Onset  . Heart disease Mother     before age 60  . Diabetes Mother   . Hyperlipidemia Mother   . Varicose Veins Mother   . Heart disease Father   . Diabetes Father   . Cancer Neg Hx     ROS General: Negative; No fevers, chills, or night sweats;  Positive for obesity without weight loss HEENT: Negative; No changes in vision or hearing, sinus congestion, difficulty swallowing Pulmonary: Negative; No cough, wheezing, shortness of breath, hemoptysis Cardiovascular:  See HPI;  Positive for chronic DVT GI: Negative; No nausea, vomiting, diarrhea, or abdominal pain GU: Positive for prostate CA; No dysuria, hematuria, or difficulty voiding Musculoskeletal: Negative;  no myalgias, joint pain, or weakness Hematologic/Oncologic: Negative; no easy bruising, bleeding Endocrine: Negative; no heat/cold intolerance; no diabetes Neuro: Positive for Tourette's syndrome no changes in balance, headaches Skin: Negative; No rashes or skin lesions Psychiatric: Negative; No behavioral problems, depression Sleep: positive for snoring No daytime sleepiness, hypersomnolence, bruxism, restless legs, hypnogagnic hallucinations Other comprehensive 14 point system review is negative   Physical Exam BP 130/70 mmHg  Pulse 55  Ht 5' 11" (1.803 m)  Wt 296 lb (134.265 kg)  BMI 41.30 kg/m2   Wt Readings from Last 3 Encounters:  12/27/15 296 lb (134.265 kg)  12/07/15 296 lb 3.2 oz (134.355 kg)    10/04/15 291 lb (131.997 kg)   General: Alert, oriented, no distress; Morbidly obese Skin: normal turgor, no rashes, warm and dry HEENT: Normocephalic, atraumatic. Pupils equal round and reactive to light; sclera anicteric; extraocular muscles intact; Fundi without hemorrhages or exudates Nose without nasal septal hypertrophy Mouth/Parynx benign; Mallinpatti scale 3 Neck: No JVD, no carotid bruits; normal carotid upstroke Lungs: clear to ausculatation and percussion; no wheezing or rales Chest wall: without tenderness to palpitation Heart: PMI not displaced, RRR, s1 s2 normal, 1/6 systolic murmur, no diastolic murmur, no rubs, gallops, thrills, or heaves Abdomen: soft, nontender; no hepatosplenomehaly, BS+; abdominal aorta nontender and not dilated by palpation. Back: no CVA tenderness Pulses 2+ Musculoskeletal: full range of motion, normal strength, no joint deformities Extremities: no clubbing cyanosis or edema, Homan's sign negative  Neurologic: grossly nonfocal; Cranial nerves grossly wnl Psychologic: Normal mood and affect  ECG (independently read by me): Sinus bradycardia 55 bpm with mild sinus arrhythmia.  Normal intervals.  No significant ST segment changes.  May 2016  ECG done in our office (independently read by me): Sinus rhythm at 58 bpm..  When compared to his prior ECG of one year ago on 02/20/2014, these lateral T-wave changes are new.  I also reviewed the EKG from Dr. Yolanda Manges office and the T-wave changes seen on her ECG are similar.  June 2015 ECG (independently read by me): Normal sinus rhythm at 75 beats per minute.  No ectopy.  LABS:  BMP Latest Ref Rng 12/06/2015 09/21/2015 08/30/2015  Glucose 70 - 99 mg/dL 113(H) 112(H) 136(H)  BUN 6 - 23 mg/dL 21 20 24(H)  Creatinine 0.40 - 1.50 mg/dL 1.09 1.24 1.25(H)  Sodium 135 - 145 mEq/L 141 142 141  Potassium 3.5 - 5.1 mEq/L 4.2 4.3 4.2  Chloride 96 - 112 mEq/L 105 105 108  CO2 19 - 32 mEq/L 28 29 21(L)  Calcium 8.4 - 10.5 mg/dL 9.5 9.7 9.4   Hepatic Function Latest Ref Rng 09/21/2015 10/31/2014 03/01/2014  Total Protein 6.0 - 8.3 g/dL 7.5 6.8 7.0  Albumin 3.5 - 5.2 g/dL 4.2 4.1 3.9  AST 0 - 37 U/L _0 ALT 0 - 53 U/L _1 Alk Phosphatase 39 - 117 U/L 90 73 76  Total Bilirubin 0.2 - 1.2 mg/dL 0.6 0.6 0.6   CBC Latest Ref Rng 08/30/2015 05/03/2014 03/01/2014  WBC 4.0 - 10.5 K/uL 9.6 11.8(H) 10.6(H)  Hemoglobin 13.0 - 17.0 g/dL 14.1 15.1 14.6  Hematocrit 39.0 - 52.0 % 41.8 42.9 42.6  Platelets 150 - 400 K/uL 255 245 263   Lab Results  Component Value Date   MCV 80.7 08/30/2015   MCV 80.2 05/03/2014   MCV 81.0 03/01/2014   Lab Results  Component Value Date   TSH 1.160 03/01/2014   Lab Results  Component Value Date   HGBA1C 6.6* 12/06/2015   Lipid Panel     Component Value Date/Time   CHOL 170 02/08/2015 0838   TRIG 210.0* 02/08/2015 0838   HDL 35.40* 02/08/2015 0838   CHOLHDL 5 02/08/2015 0838   VLDL 42.0* 02/08/2015 0838   LDLCALC 98 10/31/2014 0946   LDLDIRECT 103.0 02/08/2015 0838    ASSESSMENT AND PLAN: Mr. Essie Gehret is a 65 year old gentleman who was diagnosed prostate CA.  and underwent treatment.  He  has been documented to have left lower extremity DVT and  has been on anticoagulation since January 2015.  Last year he had chronic residual thrombus noted in his left common  femoral vein, femoral vein, popliteal vein, and the posterior tibial vein.  He has significant lower extremity varicosities.  When I last saw him, I was concerned about sleep apnea.  He never followed up to have a sleep study which was recommended.  Since I saw him, he had developed an episode of atrial fibrillation.  He was on anticoagulation at the time and cardioverted spontaneously without undergoing DC cardioversion.  His blood pressure today is controlled.  On amlodipine 2.5 mg, losartan 100 mg, metoprolol, tartrate 25 mg twice a day.  He is diabetic on glipizide and metformin.  His lipid studies in the past.  The been consistent with an atherogenic dyslipidemic pattern with elevated triglycerides, VLDL, and low HDL levels. His echo Doppler study  confirms normal systolic function with mild grade 1 diastolic dysfunction , which may may be contributory to some of his exertional dyspnea.  With his poor sleep, and specifically now that he has had an episode of atrial fibrillation.  I have strongly recommended that he undergo a sleep study.  I discussed with him the recurrence rate of atrial fibrillation is double in patients with sleep apnea whose sleep apnea is untreated compared to those who are undergoing therapy.  We again discussed importance of weight loss.  Laboratory was reviewed.  I will see him in follow-up and I suspect to be seen in sleep clinic following initiation of CPAP therapy.  Time spent: 25 minutes  Troy Sine, MD, [facility] 12/27/2015 8:42 AM

## 2015-12-27 NOTE — Patient Instructions (Addendum)
Your physician has recommended that you have a sleep study. This test records several body functions during sleep, including: brain activity, eye movement, oxygen and carbon dioxide blood levels, heart rate and rhythm, breathing rate and rhythm, the flow of air through your mouth and nose, snoring, body muscle movements, and chest and belly movement.  Your physician recommends that you schedule a follow-up appointment in: 5 months in a sleep clinic.

## 2015-12-29 DIAGNOSIS — I4891 Unspecified atrial fibrillation: Secondary | ICD-10-CM | POA: Insufficient documentation

## 2016-01-01 ENCOUNTER — Other Ambulatory Visit: Payer: Self-pay

## 2016-01-01 NOTE — Telephone Encounter (Signed)
Patient is needing a refill on [facility] number [phone]

## 2016-01-02 MED ORDER — RIVAROXABAN 20 MG PO TABS: ORAL_TABLET | ORAL | Status: DC

## 2016-01-02 NOTE — Telephone Encounter (Signed)
Dr Everlene Farrier, you saw pt in Feb, but don't see this med discussed. Do you want to give him RFs?

## 2016-01-13 ENCOUNTER — Ambulatory Visit (HOSPITAL_BASED_OUTPATIENT_CLINIC_OR_DEPARTMENT_OTHER): Payer: BLUE CROSS/BLUE SHIELD | Attending: Cardiovascular Disease | Admitting: Cardiovascular Disease

## 2016-01-21 ENCOUNTER — Telehealth: Payer: Self-pay | Admitting: Gastroenterology

## 2016-01-21 ENCOUNTER — Telehealth: Payer: Self-pay

## 2016-01-21 NOTE — Telephone Encounter (Signed)
I spoke to the pt and gave him the recommendation from the EGD in Dec. "Repeat EGD ([facility]) in 2 months to check for reflux related esophagitis healing and biopsy of Barrett's appearing mucosa".  Pt states he wil call back when he is ready to set that up, he has to meet his deductible first

## 2016-01-21 NOTE — Telephone Encounter (Signed)
Patient is calling because [facility] and he wants his [facility] sent to [facility] on [address] from now on. Patient states he currently doesn't need any refills but he could have got some medications at [facility] for free. [phone]

## 2016-01-23 NOTE — Progress Notes (Signed)
This encounter was created in error - please disregard.

## 2016-01-23 NOTE — Procedures (Signed)
Erroneous encounter

## 2016-01-24 NOTE — Telephone Encounter (Signed)
Ok pharmacy changed 

## 2016-01-29 ENCOUNTER — Other Ambulatory Visit: Payer: Self-pay | Admitting: *Deleted

## 2016-01-29 ENCOUNTER — Telehealth: Payer: Self-pay | Admitting: Endocrinology

## 2016-01-29 MED ORDER — GLIPIZIDE ER 2.5 MG PO TB24: 2.5000 mg | ORAL_TABLET | Freq: Every day | ORAL | Status: DC

## 2016-01-29 MED ORDER — METFORMIN HCL ER 500 MG PO TB24: ORAL_TABLET | ORAL | Status: DC

## 2016-01-29 MED ORDER — CANAGLIFLOZIN 100 MG PO TABS: ORAL_TABLET | ORAL | Status: DC

## 2016-01-29 MED ORDER — TRULICITY 1.5 MG/0.5ML ~~LOC~~ SOAJ: SUBCUTANEOUS | Status: DC

## 2016-01-29 MED ORDER — GLUCOSE BLOOD VI STRP: ORAL_STRIP | Status: DC

## 2016-01-29 NOTE — Telephone Encounter (Signed)
rx's sent as patient requested.

## 2016-01-29 NOTE — Telephone Encounter (Signed)
trulicity and test strips [facility] but all other meds to be called into [facility] on [address]

## 2016-01-30 DIAGNOSIS — M65331 Trigger finger, right middle finger: Secondary | ICD-10-CM | POA: Diagnosis not present

## 2016-02-05 ENCOUNTER — Other Ambulatory Visit: Payer: Self-pay | Admitting: *Deleted

## 2016-02-05 ENCOUNTER — Telehealth: Payer: Self-pay | Admitting: Endocrinology

## 2016-02-05 MED ORDER — TRULICITY 1.5 MG/0.5ML ~~LOC~~ SOAJ: SUBCUTANEOUS | Status: DC

## 2016-02-05 MED ORDER — CANAGLIFLOZIN 100 MG PO TABS: ORAL_TABLET | ORAL | Status: DC

## 2016-02-05 MED ORDER — GLUCOSE BLOOD VI STRP: ORAL_STRIP | Status: DC

## 2016-02-05 NOTE — Telephone Encounter (Signed)
rx's have been [facility]

## 2016-02-05 NOTE — Telephone Encounter (Signed)
[facility] for invokana, test strips, and the trulicity 90 day supply  FYI:  All others go to [facility].

## 2016-02-11 ENCOUNTER — Other Ambulatory Visit: Payer: Self-pay | Admitting: Cardiovascular Disease

## 2016-02-11 ENCOUNTER — Other Ambulatory Visit: Payer: Self-pay | Admitting: Emergency Medicine

## 2016-02-13 ENCOUNTER — Telehealth: Payer: Self-pay

## 2016-02-13 NOTE — Telephone Encounter (Signed)
Pt is needing to check on the status of his atorvastatin

## 2016-02-14 ENCOUNTER — Telehealth: Payer: Self-pay | Admitting: Emergency Medicine

## 2016-02-14 MED ORDER — ATORVASTATIN CALCIUM 40 MG PO TABS: ORAL_TABLET | ORAL | Status: DC

## 2016-02-14 NOTE — Telephone Encounter (Signed)
Spoke with pt regarding medication RF Lipitor Informed pt we needed cholesterol blood wok before we can Rf medication Pt states, Dr. Dwyane Dee orders A1c, Lipid and Microalbumin/Creat, he is also scheduled to see Dr. Everlene Farrier in 2 weeks for annual CPE. Medication reordered and e-scribed to pharmacy listed

## 2016-02-15 NOTE — Telephone Encounter (Signed)
Notified pt that the RF had been sent. He stated that he is going on a trip, but will plan to get in to see Dr Everlene Farrier for f/up in about 2 1/2 wks.

## 2016-03-09 ENCOUNTER — Other Ambulatory Visit: Payer: Self-pay | Admitting: Emergency Medicine

## 2016-03-11 ENCOUNTER — Telehealth: Payer: Self-pay

## 2016-03-11 DIAGNOSIS — I82402 Acute embolism and thrombosis of unspecified deep veins of left lower extremity: Secondary | ICD-10-CM

## 2016-03-11 DIAGNOSIS — R911 Solitary pulmonary nodule: Secondary | ICD-10-CM

## 2016-03-11 NOTE — Telephone Encounter (Signed)
Pt is needing to talk to clinical -he did not want to go into details about his message   Best number [phone]

## 2016-03-12 NOTE — Telephone Encounter (Signed)
Spoke with pt, he needs  A CT of his lung and venous doppler but he wants this on the same day if possible. Pt will come in to get a physical from Dr. Everlene Farrier. Please advise.

## 2016-03-13 NOTE — Telephone Encounter (Signed)
Please call patient and advised him of the new procedure to schedule an appointment for the following day after calling the day before. I will be happy to see him and schedule his procedures when I see him.

## 2016-03-18 ENCOUNTER — Telehealth: Payer: Self-pay

## 2016-03-18 DIAGNOSIS — IMO0001 Reserved for inherently not codable concepts without codable children: Secondary | ICD-10-CM

## 2016-03-18 DIAGNOSIS — I82502 Chronic embolism and thrombosis of unspecified deep veins of left lower extremity: Secondary | ICD-10-CM

## 2016-03-18 DIAGNOSIS — I82402 Acute embolism and thrombosis of unspecified deep veins of left lower extremity: Secondary | ICD-10-CM

## 2016-03-18 NOTE — Telephone Encounter (Signed)
Pt is upset, said he can't afford to keep paying the co pay that is required for his office visit. He wants to know why can't the test be ordered, he go and have the test performed and then just come in for Dr. Everlene Farrier to read the results to him? Pt feels that our office has handled his feelings inappropriately and no one here cares. Please contact patient about this issue please.

## 2016-03-19 NOTE — Telephone Encounter (Signed)
.   Will you please call the patient and ask him what test he is talking about. We may be able to order the test without him having an office visit but it is not clear to me what test he is talking about.

## 2016-03-19 NOTE — Telephone Encounter (Signed)
The doppler order that was placed today is an old order type and cannot be used for scheduling.  It will need to be changed to VAS [name] LOWER EXTREMITY VENOUS(DVT) in order to be scheduled.  Thank you.

## 2016-03-19 NOTE — Telephone Encounter (Signed)
Ok done

## 2016-03-19 NOTE — Telephone Encounter (Signed)
Spoke with pt, advised him that I would try to get the scans done on the same day. He stated he will come to see Dr. Everlene Farrier after test are done. i advised him of the new walk in policy.

## 2016-03-20 NOTE — Telephone Encounter (Signed)
ok 

## 2016-03-20 NOTE — Telephone Encounter (Signed)
I received a call from [place] regarding the patient's ultrasound order.  Apparently, the original order was correct.  Please resubmit, as it was removed from the scheduling list and cannot be used.  Please enter a new order of US Venous Img Lower Unilateral Left.  The reason for the exam is persistent blood clot.  The diagnosis is DVT (deep venous thrombosis), left.  My apologies for the mistake.

## 2016-03-21 NOTE — Telephone Encounter (Signed)
Please place a new order since the other one was removed from the scheduled orders list and cannot be used.  US Venous Img Lower Unilateral Left.  Thank you.

## 2016-03-24 NOTE — Telephone Encounter (Signed)
Ok added.

## 2016-04-01 ENCOUNTER — Ambulatory Visit
Admission: RE | Admit: 2016-04-01 | Discharge: 2016-04-01 | Disposition: A | Discharge status: Home / Self Care | Payer: BLUE CROSS/BLUE SHIELD | Source: Ambulatory Visit | Attending: Emergency Medicine | Admitting: Emergency Medicine

## 2016-04-01 ENCOUNTER — Other Ambulatory Visit: Payer: Self-pay | Admitting: Cardiovascular Disease

## 2016-04-01 DIAGNOSIS — I82502 Chronic embolism and thrombosis of unspecified deep veins of left lower extremity: Principal | ICD-10-CM

## 2016-04-01 DIAGNOSIS — R911 Solitary pulmonary nodule: Secondary | ICD-10-CM | POA: Diagnosis not present

## 2016-04-01 DIAGNOSIS — I82402 Acute embolism and thrombosis of unspecified deep veins of left lower extremity: Secondary | ICD-10-CM | POA: Diagnosis not present

## 2016-04-01 DIAGNOSIS — IMO0001 Reserved for inherently not codable concepts without codable children: Secondary | ICD-10-CM

## 2016-04-01 NOTE — Telephone Encounter (Signed)
Rx(s) sent to pharmacy electronically.  

## 2016-04-02 ENCOUNTER — Other Ambulatory Visit: Payer: Self-pay | Admitting: Emergency Medicine

## 2016-04-02 DIAGNOSIS — R9389 Abnormal findings on diagnostic imaging of other specified body structures: Secondary | ICD-10-CM

## 2016-04-07 ENCOUNTER — Other Ambulatory Visit: Payer: Self-pay

## 2016-04-07 MED ORDER — RIVAROXABAN 20 MG PO TABS: ORAL_TABLET | ORAL | 1 refills | Status: DC

## 2016-04-08 ENCOUNTER — Other Ambulatory Visit: Payer: BLUE CROSS/BLUE SHIELD

## 2016-04-08 DIAGNOSIS — C61 Malignant neoplasm of prostate: Secondary | ICD-10-CM | POA: Diagnosis not present

## 2016-04-11 ENCOUNTER — Ambulatory Visit: Payer: BLUE CROSS/BLUE SHIELD | Admitting: Endocrinology

## 2016-04-13 ENCOUNTER — Other Ambulatory Visit: Payer: Self-pay | Admitting: Emergency Medicine

## 2016-04-15 NOTE — Telephone Encounter (Signed)
Most recent labs look good.  BP controlled.  Advise that he come in for a physical in within the next three months so we can continue to treat his BP.  Philis Fendt, MS, PA-C 1:46 PM, 04/15/2016

## 2016-04-15 NOTE — Telephone Encounter (Signed)
Per previous notes patient states he gets his blood work done at [facility] office is upset he does not want to come in for office visit.

## 2016-04-21 DIAGNOSIS — Z8546 Personal history of malignant neoplasm of prostate: Secondary | ICD-10-CM | POA: Diagnosis not present

## 2016-04-23 ENCOUNTER — Encounter: Payer: Self-pay | Admitting: Emergency Medicine

## 2016-04-23 ENCOUNTER — Ambulatory Visit (INDEPENDENT_AMBULATORY_CARE_PROVIDER_SITE_OTHER): Payer: BLUE CROSS/BLUE SHIELD | Admitting: Emergency Medicine

## 2016-04-23 ENCOUNTER — Encounter: Payer: Self-pay | Admitting: *Deleted

## 2016-04-23 VITALS — BP 132/80 | HR 67 | Temp 97.6°F | Resp 17 | Ht 70.5 in | Wt 292.0 lb

## 2016-04-23 DIAGNOSIS — I8312 Varicose veins of left lower extremity with inflammation: Secondary | ICD-10-CM

## 2016-04-23 DIAGNOSIS — C61 Malignant neoplasm of prostate: Secondary | ICD-10-CM | POA: Diagnosis not present

## 2016-04-23 DIAGNOSIS — I82402 Acute embolism and thrombosis of unspecified deep veins of left lower extremity: Secondary | ICD-10-CM | POA: Diagnosis not present

## 2016-04-23 DIAGNOSIS — I82512 Chronic embolism and thrombosis of left femoral vein: Secondary | ICD-10-CM

## 2016-04-23 DIAGNOSIS — Z23 Encounter for immunization: Secondary | ICD-10-CM | POA: Diagnosis not present

## 2016-04-23 DIAGNOSIS — M6530 Trigger finger, unspecified finger: Secondary | ICD-10-CM

## 2016-04-23 DIAGNOSIS — I499 Cardiac arrhythmia, unspecified: Secondary | ICD-10-CM

## 2016-04-23 DIAGNOSIS — E119 Type 2 diabetes mellitus without complications: Secondary | ICD-10-CM

## 2016-04-23 DIAGNOSIS — R911 Solitary pulmonary nodule: Secondary | ICD-10-CM | POA: Diagnosis not present

## 2016-04-23 DIAGNOSIS — I8311 Varicose veins of right lower extremity with inflammation: Secondary | ICD-10-CM | POA: Diagnosis not present

## 2016-04-23 DIAGNOSIS — Z1159 Encounter for screening for other viral diseases: Secondary | ICD-10-CM

## 2016-04-23 DIAGNOSIS — K219 Gastro-esophageal reflux disease without esophagitis: Secondary | ICD-10-CM

## 2016-04-23 DIAGNOSIS — I872 Venous insufficiency (chronic) (peripheral): Secondary | ICD-10-CM

## 2016-04-23 DIAGNOSIS — R809 Proteinuria, unspecified: Secondary | ICD-10-CM | POA: Insufficient documentation

## 2016-04-23 LAB — POCT CBC
Granulocyte percent: 60.6 %G (ref 37–80)
HCT, POC: 42.7 % — AB (ref 43.5–53.7)
Hemoglobin: 15.3 g/dL (ref 14.1–18.1)
Lymph, poc: 3.1 (ref 0.6–3.4)
MCH, POC: 28.3 pg (ref 27–31.2)
MCHC: 35.9 g/dL — AB (ref 31.8–35.4)
MCV: 78.8 fL — AB (ref 80–97)
MID (cbc): 0.6 (ref 0–0.9)
MPV: 8.4 fL (ref 0–99.8)
POC Granulocyte: 5.7 (ref 2–6.9)
POC LYMPH PERCENT: 33.2 %L (ref 10–50)
POC MID %: 6.2 %M (ref 0–12)
Platelet Count, POC: 211 10*3/uL (ref 142–424)
RBC: 5.41 M/uL (ref 4.69–6.13)
RDW, POC: 15.9 %
WBC: 9.4 10*3/uL (ref 4.6–10.2)

## 2016-04-23 LAB — LIPID PANEL
Cholesterol: 164 mg/dL (ref 125–200)
HDL: 32 mg/dL — ABNORMAL LOW (ref >=40)
LDL Cholesterol: 97 mg/dL (ref <130)
Total CHOL/HDL Ratio: 5.1 Ratio — ABNORMAL HIGH (ref <=5.0)
Triglycerides: 175 mg/dL — ABNORMAL HIGH (ref <150)
VLDL: 35 mg/dL — ABNORMAL HIGH (ref <30)

## 2016-04-23 LAB — COMPLETE METABOLIC PANEL WITH GFR
ALT: 13 U/L (ref 9–46)
AST: 12 U/L (ref 10–35)
Albumin: 4.3 g/dL (ref 3.6–5.1)
Alkaline Phosphatase: 67 U/L (ref 40–115)
BUN: 20 mg/dL (ref 7–25)
CO2: 25 mmol/L (ref 20–31)
Calcium: 9.3 mg/dL (ref 8.6–10.3)
Chloride: 106 mmol/L (ref 98–110)
Creat: 1.26 mg/dL — ABNORMAL HIGH (ref 0.70–1.25)
GFR, Est African American: 70 mL/min (ref >=60)
GFR, Est Non African American: 60 mL/min (ref >=60)
Glucose, Bld: 108 mg/dL — ABNORMAL HIGH (ref 65–99)
Potassium: 4.7 mmol/L (ref 3.5–5.3)
Sodium: 142 mmol/L (ref 135–146)
Total Bilirubin: 0.7 mg/dL (ref 0.2–1.2)
Total Protein: 7 g/dL (ref 6.1–8.1)

## 2016-04-23 LAB — GLUCOSE, POCT (MANUAL RESULT ENTRY): POC Glucose: 102 mg/dl — AB (ref 70–99)

## 2016-04-23 LAB — VITAMIN B12: Vitamin B-12: 364 pg/mL (ref 200–1100)

## 2016-04-23 LAB — HEPATITIS C ANTIBODY: HCV Ab: NEGATIVE

## 2016-04-23 LAB — MAGNESIUM: Magnesium: 2 mg/dL (ref 1.5–2.5)

## 2016-04-23 LAB — FERRITIN: Ferritin: 98 ng/mL (ref 20–380)

## 2016-04-23 LAB — POCT GLYCOSYLATED HEMOGLOBIN (HGB A1C): Hemoglobin A1C: 6.1

## 2016-04-23 MED ORDER — RIVAROXABAN 20 MG PO TABS: ORAL_TABLET | ORAL | 3 refills | Status: DC

## 2016-04-23 MED ORDER — OMEPRAZOLE 40 MG PO CPDR: DELAYED_RELEASE_CAPSULE | ORAL | 3 refills | Status: DC

## 2016-04-23 MED ORDER — ATORVASTATIN CALCIUM 40 MG PO TABS: ORAL_TABLET | ORAL | 3 refills | Status: DC

## 2016-04-23 MED ORDER — METFORMIN HCL ER 500 MG PO TB24: ORAL_TABLET | ORAL | 1 refills | Status: DC

## 2016-04-23 MED ORDER — LOSARTAN POTASSIUM 100 MG PO TABS: ORAL_TABLET | ORAL | 3 refills | Status: DC

## 2016-04-23 MED ORDER — AMLODIPINE BESYLATE 2.5 MG PO TABS: 2.5000 mg | ORAL_TABLET | Freq: Every day | ORAL | 3 refills | Status: DC

## 2016-04-23 MED ORDER — METOPROLOL TARTRATE 25 MG PO TABS: 25.0000 mg | ORAL_TABLET | Freq: Two times a day (BID) | ORAL | 3 refills | Status: DC

## 2016-04-23 NOTE — Progress Notes (Signed)
By signing my name below, I, Moises Blood, attest that this documentation has been prepared under the direction and in the presence of [name], MD. Electronically Signed: Moises Blood, [place]. 04/23/2016 , 10:07 AM .  Patient was seen in room 3 .  Chief Complaint:  Chief Complaint  Patient presents with  . Annual Exam    HPI: Tommy Yang is a 65 y.o. male who reports to [facility] today for annual physical. Patient has a history of LLE and prostate cancer. He's seen by his urologist recently and had PSA done. He plans on seeing his endocrinologist (Dr. Dwyane Dee), who manages his diabetes in 5 days. He's scheduled to see his pulmonologist next week. He had a CT chest done recently, which showed a prominent anterior mediastinal lymph node measuring 14 x 24 mm; follow up in 4-6 months. He sees cardiologist (Dr. Claiborne Billings) annually.   His last colonoscopy was in Dec 2016.   Lab Results  Component Value Date   MICROALBUR 8.0 (H) 12/06/2015    Past Medical History:  Diagnosis Date  . Diabetes mellitus without complication ([place])   . DVT (deep venous thrombosis) (HCC)    left leg.  [name] GERD (gastroesophageal reflux disease)   . Hypercholesterolemia   . Hypertension   . Prostate cancer ([place]) 12/06/13   gleason 4+3=7, 6/12 cores positive. seed implant and radiation   Past Surgical History:  Procedure Laterality Date  . APPENDECTOMY    . BALLOON DILATION N/A 08/02/2015   Procedure: BALLOON DILATION;  Surgeon: [name], MD;  Location: [facility];  Service: Endoscopy;  Laterality: N/A;  . COLONOSCOPY WITH PROPOFOL N/A 08/02/2015   Procedure: COLONOSCOPY WITH PROPOFOL w/ APC;  Surgeon: [name], MD;  Location: [facility];  Service: Endoscopy;  Laterality: N/A;  . ESOPHAGOGASTRODUODENOSCOPY (EGD) WITH PROPOFOL N/A 08/02/2015   Procedure: ESOPHAGOGASTRODUODENOSCOPY (EGD) WITH PROPOFOL/ possible dilation.;  Surgeon: [name], MD;  Location: [facility];  Service:  Endoscopy;  Laterality: N/A;  . KNEE SURGERY     right knee orthoscopic  . PROSTATE BIOPSY  12/06/13   Gleason 4+3=7, volume 35 gm  . [place]  . TONSILLECTOMY     Social History   Social History  . Marital status: Married    Spouse name: N/A  . Number of children: 1  . Years of education: N/A   Occupational History  .  Retired   Social History Main Topics  . Smoking status: Former Smoker    Packs/day: 0.50    Years: 23.00    Types: Cigarettes    Quit date: 08/01/2012  . Smokeless tobacco: Never Used  . Alcohol use No  . Drug use: No  . Sexual activity: Yes    Birth control/ protection: Abstinence   Other Topics Concern  . None   Social History Narrative  . None   Family History  Problem Relation Age of Onset  . Heart disease Mother     before age 28  . Diabetes Mother   . Hyperlipidemia Mother   . Varicose Veins Mother   . Heart disease Father   . Diabetes Father   . Cancer Neg Hx    No Known Allergies Prior to Admission medications   Medication Sig Start Date End Date Taking? Authorizing Provider  amLODipine (NORVASC) 2.5 MG tablet TAKE ONE TABLET BY MOUTH ONCE DAILY 04/01/16  Yes Troy Sine, MD  atorvastatin (LIPITOR) 40 MG  tablet TAKE ONE TABLET BY MOUTH ONCE DAILY. 02/14/16  Yes Darlyne Russian, MD  blood glucose meter kit and supplies KIT Test blood sugar daily as directed. Dx code: E11.9 01/23/15  Yes Darlyne Russian, MD  canagliflozin [facility]) 100 MG TABS tablet 1 tablet before breakfast 02/05/16  Yes Elayne Snare, MD  CINNAMON PO Take 1,000 mg by mouth daily.   Yes Historical Provider, MD  glipiZIDE (GLUCOTROL XL) 2.5 MG 24 hr tablet Take 1 tablet (2.5 mg total) by mouth daily with breakfast. 01/29/16  Yes Elayne Snare, MD  glucose blood test strip Check twice daily. DX: E11.65 One touch ultra Blue 02/05/16  Yes Elayne Snare, MD  losartan (COZAAR) 100 MG tablet TAKE ONE TABLET BY MOUTH ONCE DAILY (PATIENT  NEEDS  OFFICE   VISIT  FOR  ADDITIONAL  REFILLS) 04/15/16  Yes Tereasa Coop, PA-C  metFORMIN (GLUCOPHAGE XR) 500 MG 24 hr tablet Take 2 tablets twice daily 01/29/16  Yes Elayne Snare, MD  metoprolol tartrate (LOPRESSOR) 25 MG tablet Take 1 tablet (25 mg total) by mouth 2 (two) times daily. 11/05/15  Yes Troy Sine, MD  Omega-3 Fatty Acids (FISH OIL) 1000 MG CAPS Take 1,000 mg by mouth 2 (two) times daily.   Yes Historical Provider, MD  omeprazole (PRILOSEC) 40 MG capsule Take 1 capsule (40 mg total) by mouth 2 (two) times daily before a meal. 11/07/15  Yes [name], MD  rivaroxaban (XARELTO) 20 MG TABS tablet TAKE ONE TABLET BY MOUTH ONCE DAILY WITH SUPPER 04/07/16  Yes Darlyne Russian, MD  TRULICITY 1.5 OI/3.2PQ SOPN Inject contents of one(1) pen per week 02/05/16  Yes Elayne Snare, MD  Vitamin D, Cholecalciferol, 400 UNITS TABS Take 400 Units by mouth daily.   Yes Historical Provider, MD     ROS:  Constitutional: negative for fever, chills, night sweats, weight changes, or fatigue  HEENT: negative for vision changes, hearing loss, congestion, rhinorrhea, ST, epistaxis, or sinus pressure Cardiovascular: negative for chest pain or palpitations Respiratory: negative for hemoptysis, wheezing, shortness of breath, or cough Abdominal: negative for abdominal pain, nausea, vomiting, diarrhea, or constipation Dermatological: negative for rash Neurologic: negative for headache, dizziness, or syncope All other systems reviewed and are otherwise negative with the exception to those above and in the HPI.  PHYSICAL EXAM: Vitals:   04/23/16 0921  BP: 132/80  Pulse: 67  Resp: 17  Temp: 97.6 F (36.4 C)   Body mass index is 41.31 kg/m.   General: Alert, no acute distress; obese HEENT:  Normocephalic, atraumatic, oropharynx patent. Eye: Juliette Mangle [facility] Cardiovascular:  Regular rate and rhythm, no rubs murmurs or gallops.  No Carotid bruits, radial pulse intact. No pedal edema.  Respiratory: Clear to auscultation  bilaterally.  No wheezes, rales, or rhonchi.  No cyanosis, no use of accessory musculature Abdominal: No organomegaly, abdomen is soft and non-tender, positive bowel sounds. No masses. Musculoskeletal: Gait intact. No edema, tenderness Skin: Mild swelling of left calf, bilateral changes of stasis dermatitis with bilateral varicose veins Neurologic: Facial musculature symmetric. Psychiatric: Patient acts appropriately throughout our interaction.  Lymphatic: No cervical or submandibular lymphadenopathy Genitourinary/Anorectal: No acute findings  LABS: Results for orders placed or performed in visit on 04/23/16  POCT CBC  Result Value Ref Range   WBC 9.4 4.6 - 10.2 K/uL   Lymph, poc 3.1 0.6 - 3.4   POC LYMPH PERCENT 33.2 10 - 50 %L   MID (cbc) 0.6 0 - 0.9   POC  MID % 6.2 0 - 12 %M   POC Granulocyte 5.7 2 - 6.9   Granulocyte percent 60.6 37 - 80 %G   RBC 5.41 4.69 - 6.13 M/uL   Hemoglobin 15.3 14.1 - 18.1 g/dL   HCT, POC 42.7 (A) 43.5 - 53.7 %   MCV 78.8 (A) 80 - 97 fL   MCH, POC 28.3 27 - 31.2 pg   MCHC 35.9 (A) 31.8 - 35.4 g/dL   RDW, POC 15.9 %   Platelet Count, POC 211 142 - 424 K/uL   MPV 8.4 0 - 99.8 fL  POCT glucose (manual entry)  Result Value Ref Range   POC Glucose 102 (A) 70 - 99 mg/dl  POCT glycosylated hemoglobin (Hb A1C)  Result Value Ref Range   Hemoglobin A1C 6.1    EKG/XRAY:     ASSESSMENT/PLAN: Patient looks great. His sugar is under very good control. He overall is doing well working on trying to lose some weight. He has continued follow-ups with his cardiologist and with his urologist. He also is under the care of Dr. Dwyane Dee for his diabetes.I personally performed the services described in this documentation, which was scribed in my presence. The recorded information has been reviewed and is accurate. He was given pneumonia 23 vaccine today along with his flu shot.  Gross sideeffects, risk and benefits, and alternatives of medications d/w patient. Patient is  aware that all medications have potential sideeffects and we are unable to predict every sideeffect or drug-drug interaction that may occur.  [name] MD 04/23/2016 9:35 AM

## 2016-04-23 NOTE — Patient Instructions (Signed)
     IF you received an x-ray today, you will receive an invoice from [facility]. Please contact [facility] at [phone] with questions or concerns regarding your invoice.   IF you received labwork today, you will receive an invoice from [facility]. Please contact [facility] at [phone] with questions or concerns regarding your invoice.   Our billing staff will not be able to assist you with questions regarding bills from these companies.  You will be contacted with the lab results as soon as they are available. The fastest way to get your results is to activate your My Chart account. Instructions are located on the last page of this paperwork. If you have not heard from us regarding the results in 2 weeks, please contact this office.      

## 2016-04-24 LAB — VITAMIN D 25 HYDROXY (VIT D DEFICIENCY, FRACTURES): Vit D, 25-Hydroxy: 36 ng/mL (ref 30–100)

## 2016-04-25 ENCOUNTER — Telehealth: Payer: Self-pay

## 2016-04-28 ENCOUNTER — Ambulatory Visit (INDEPENDENT_AMBULATORY_CARE_PROVIDER_SITE_OTHER): Payer: BLUE CROSS/BLUE SHIELD | Admitting: Endocrinology

## 2016-04-28 ENCOUNTER — Telehealth: Payer: Self-pay

## 2016-04-28 ENCOUNTER — Encounter: Payer: Self-pay | Admitting: Endocrinology

## 2016-04-28 VITALS — BP 117/61 | HR 65 | Ht 71.0 in | Wt 294.0 lb

## 2016-04-28 DIAGNOSIS — E782 Mixed hyperlipidemia: Secondary | ICD-10-CM | POA: Diagnosis not present

## 2016-04-28 DIAGNOSIS — E119 Type 2 diabetes mellitus without complications: Secondary | ICD-10-CM

## 2016-04-28 NOTE — Progress Notes (Signed)
Patient ID: Tommy Yang, male   DOB: 1950/11/13, 65 y.o.   MRN: [id number]           Reason for Appointment: Follow-up for Type 2 Diabetes  Referring physician: [place]  History of Present Illness:          Diagnosis: Type 2 diabetes mellitus, date of diagnosis: 05/2013       Past history:   He apparently had significant hyperglycemia in 2014 when seen in the urgent care center but did not establish with a PCP for control. He was started on treatment for his diabetes in 10/2013 and he was initially treated with metformin and Januvia A few months later he was also given glipizide ER to help his control when his A1c had gone up to 10.9 He says he was also seen by dietitian but no record available of this. Subsequently his blood sugar control had improved with A1c down to 6.8 in 9/15  Recent history:   On his consultation in 3/16 he had high blood sugars and weight gain; A1c was 9.1% with taking glipizide ER, Januvia and metformin.  With taking Trulicity instead of Januvia he has had consistent improvement in his glucose control Metformin was increased to 2000 mg   His A1c has improved further and now 0.1  Current blood sugar patterns and problems identified:  He has had fairly stable blood sugars throughout the day  No side effects from current medication regimen  He is trying to check blood sugars at various times including some after meals  Occasionally has high readings and he thinks some of this related to either eating or drinking something with sugar such as Gatorade  Still having difficulty losing weight and has gained some weight since last visit  Currently not able to exercise because of palpitations and dyspnea on exertion   Oral hypoglycemic drugs the patient is taking are: Glipizide ER 2.5 mg in am, metformin 1 g a.m. and 1.5 g p.m. Side effects from medications have been: None Compliance with the medical regimen: Good  Hypoglycemia: None recently    Glucose  monitoring:  done 1-2 times a day         Glucometer: One Touch.      Blood Glucose readings by monitor download:  Mean values apply above for all meters except median for One Touch  PRE-MEAL Fasting Lunch Dinner PCS  Overall  Glucose range: 93-203   76-153  116-225    Mean/median: 122   114   123    Self-care: The diet that the patient has been following is: tries to limit starches and fats .     Meals: 3 meals per day. Breakfast is sometimes cereal, frequently having soup for lunch            Exercise:  walking a little        Dietician visit, most recent:?  2015 .               Weight history:    Wt Readings from Last 3 Encounters:  04/28/16 294 lb (133.4 kg)  04/23/16 292 lb (132.5 kg)  01/13/16 287 lb (130.2 kg)    Glycemic control:   Lab Results  Component Value Date   HGBA1C 6.1 04/23/2016   HGBA1C 6.6 (H) 12/06/2015   HGBA1C 6.2 08/15/2015   Lab Results  Component Value Date   MICROALBUR 8.0 (H) 12/06/2015   LDLCALC 97 04/23/2016   CREATININE 1.26 (H) 04/23/2016    Office  Visit on 04/23/2016  Component Date Value Ref Range Status  . WBC 04/23/2016 9.4  4.6 - 10.2 K/uL Final  . Lymph, poc 04/23/2016 3.1  0.6 - 3.4 Final  . POC LYMPH PERCENT 04/23/2016 33.2  10 - 50 %L Final  . MID (cbc) 04/23/2016 0.6  0 - 0.9 Final  . POC MID % 04/23/2016 6.2  0 - 12 %M Final  . POC Granulocyte 04/23/2016 5.7  2 - 6.9 Final  . Granulocyte percent 04/23/2016 60.6  37 - 80 %G Final  . RBC 04/23/2016 5.41  4.69 - 6.13 M/uL Final  . Hemoglobin 04/23/2016 15.3  14.1 - 18.1 g/dL Final  . HCT, POC 04/23/2016 42.7* 43.5 - 53.7 % Final  . MCV 04/23/2016 78.8* 80 - 97 fL Final  . MCH, POC 04/23/2016 28.3  27 - 31.2 pg Final  . MCHC 04/23/2016 35.9* 31.8 - 35.4 g/dL Final  . RDW, POC 04/23/2016 15.9  % Final  . Platelet Count, POC 04/23/2016 211  142 - 424 K/uL Final  . MPV 04/23/2016 8.4  0 - 99.8 fL Final  . POC Glucose 04/23/2016 102* 70 - 99 mg/dl Final  . Hemoglobin A1C  04/23/2016 6.1   Final  . Sodium 04/23/2016 142  135 - 146 mmol/L Final  . Potassium 04/23/2016 4.7  3.5 - 5.3 mmol/L Final  . Chloride 04/23/2016 106  98 - 110 mmol/L Final  . CO2 04/23/2016 25  20 - 31 mmol/L Final  . Glucose, Bld 04/23/2016 108* 65 - 99 mg/dL Final  . BUN 04/23/2016 20  7 - 25 mg/dL Final  . Creat 04/23/2016 1.26* 0.70 - 1.25 mg/dL Final   Comment:   For patients > or = 63 years of age: The upper reference limit for Creatinine is approximately 13% higher for people identified as African-American.     . Total Bilirubin 04/23/2016 0.7  0.2 - 1.2 mg/dL Final  . Alkaline Phosphatase 04/23/2016 67  40 - 115 U/L Final  . AST 04/23/2016 12  10 - 35 U/L Final  . ALT 04/23/2016 13  9 - 46 U/L Final  . Total Protein 04/23/2016 7.0  6.1 - 8.1 g/dL Final  . Albumin 04/23/2016 4.3  3.6 - 5.1 g/dL Final  . Calcium 04/23/2016 9.3  8.6 - 10.3 mg/dL Final  . GFR, Est African American 04/23/2016 70  >=60 mL/min Final  . GFR, Est Non African American 04/23/2016 60  >=60 mL/min Final  . Cholesterol 04/23/2016 164  125 - 200 mg/dL Final  . Triglycerides 04/23/2016 175* <150 mg/dL Final  . HDL 04/23/2016 32* >=40 mg/dL Final  . Total CHOL/HDL Ratio 04/23/2016 5.1* <=5.0 Ratio Final  . VLDL 04/23/2016 35* <30 mg/dL Final  . LDL Cholesterol 04/23/2016 97  <130 mg/dL Final   Comment:   Total Cholesterol/HDL Ratio:CHD Risk                        Coronary Heart Disease Risk Table                                        Men       Women          1/2 Average Risk              3.4        3.3  Average Risk              5.0        4.4           2X Average Risk              9.6        7.1           3X Average Risk             23.4       11.0 Use the calculated Patient Ratio above and the CHD Risk table  to determine the patient's CHD Risk.   [name] HCV Ab 04/23/2016 NEGATIVE  NEGATIVE Final  . Vitamin B-12 04/23/2016 364  200 - 1,100 pg/mL Final  . Vit D, 25-Hydroxy 04/24/2016 36   30 - 100 ng/mL Final   Comment: Vitamin D Status           25-OH Vitamin D        Deficiency                <20 ng/mL        Insufficiency         20 - 29 ng/mL        Optimal             > or = 30 ng/mL   For 25-OH Vitamin D testing on patients on D2-supplementation and patients for whom quantitation of D2 and D3 fractions is required, the [facility] 25-OH VIT D, (D2,D3), LC/MS/MS is recommended: order code [phone] (patients > 2 yrs).   . Ferritin 04/23/2016 98  20 - 380 ng/mL Final  . Magnesium 04/23/2016 2.0  1.5 - 2.5 mg/dL Final        Medication List       Accurate as of 04/28/16  9:02 PM. Always use your most recent med list.          amLODipine 2.5 MG tablet Commonly known as:  NORVASC Take 1 tablet (2.5 mg total) by mouth daily.   atorvastatin 40 MG tablet Commonly known as:  LIPITOR TAKE ONE TABLET BY MOUTH ONCE DAILY.   blood glucose meter kit and supplies Kit Test blood sugar daily as directed. Dx code: E11.9   canagliflozin 100 MG Tabs tablet Commonly known as:  INVOKANA 1 tablet before breakfast   CINNAMON PO Take 1,000 mg by mouth daily.   Fish Oil 1000 MG Caps Take 1,000 mg by mouth 2 (two) times daily.   glipiZIDE 2.5 MG 24 hr tablet Commonly known as:  GLUCOTROL XL Take 1 tablet (2.5 mg total) by mouth daily with breakfast.   glucose blood test strip Check twice daily. DX: E11.65 One touch ultra Blue   losartan 100 MG tablet Commonly known as:  COZAAR TAKE ONE TABLET BY MOUTH ONCE DAILY   metFORMIN 500 MG 24 hr tablet Commonly known as:  GLUCOPHAGE XR Take 2 tablets twice daily   metoprolol tartrate 25 MG tablet Commonly known as:  LOPRESSOR Take 1 tablet (25 mg total) by mouth 2 (two) times daily.   omeprazole 40 MG capsule Commonly known as:  PRILOSEC One tablet daily   rivaroxaban 20 MG Tabs tablet Commonly known as:  XARELTO TAKE ONE TABLET BY MOUTH ONCE DAILY WITH SUPPER   TRULICITY 1.5 FM/7.3UY Sopn Generic drug:   Dulaglutide Inject contents of one(1) pen per week   Vitamin D (Cholecalciferol) 400 units Tabs Take 400 Units by mouth daily.  Allergies: No Known Allergies  Past Medical History:  Diagnosis Date  . Diabetes mellitus without complication ([place])   . DVT (deep venous thrombosis) (HCC)    left leg.  [name] GERD (gastroesophageal reflux disease)   . Hypercholesterolemia   . Hypertension   . Prostate cancer ([place]) 12/06/13   gleason 4+3=7, 6/12 cores positive. seed implant and radiation    Past Surgical History:  Procedure Laterality Date  . APPENDECTOMY    . BALLOON DILATION N/A 08/02/2015   Procedure: BALLOON DILATION;  Surgeon: [name], MD;  Location: [facility];  Service: Endoscopy;  Laterality: N/A;  . COLONOSCOPY WITH PROPOFOL N/A 08/02/2015   Procedure: COLONOSCOPY WITH PROPOFOL w/ APC;  Surgeon: [name], MD;  Location: [facility];  Service: Endoscopy;  Laterality: N/A;  . ESOPHAGOGASTRODUODENOSCOPY (EGD) WITH PROPOFOL N/A 08/02/2015   Procedure: ESOPHAGOGASTRODUODENOSCOPY (EGD) WITH PROPOFOL/ possible dilation.;  Surgeon: [name], MD;  Location: [facility];  Service: Endoscopy;  Laterality: N/A;  . KNEE SURGERY     right knee orthoscopic  . PROSTATE BIOPSY  12/06/13   Gleason 4+3=7, volume 35 gm  . [place]  . TONSILLECTOMY      Family History  Problem Relation Age of Onset  . Heart disease Mother     before age 41  . Diabetes Mother   . Hyperlipidemia Mother   . Varicose Veins Mother   . Heart disease Father   . Diabetes Father   . Cancer Neg Hx     Social History:  reports that he quit smoking about 3 years ago. His smoking use included Cigarettes. He has a 11.50 pack-year smoking history. He has never used smokeless tobacco. He reports that he does not drink alcohol or use drugs.    Review of Systems    He is now having atrial fibrillation and get short of breath with exertionAnd  palpitations despite taking metoprolol      Lipids: he has had significant hyperlipidemia and has been treated with Lipitor 40 mg daily; no history of CAD Followed by PCP       Lab Results  Component Value Date   CHOL 164 04/23/2016   HDL 32 (L) 04/23/2016   LDLCALC 97 04/23/2016   LDLDIRECT 103.0 02/08/2015   TRIG 175 (H) 04/23/2016   CHOLHDL 5.1 (H) 04/23/2016                   The blood pressure has been treated with losartan 100 mg and low-dose metoprolol, followed by PCP and cardiologist No lightheadedness.  BP Readings from Last 3 Encounters:  04/28/16 117/61  04/23/16 132/80  12/27/15 130/70      LABS:  Office Visit on 04/23/2016  Component Date Value Ref Range Status  . WBC 04/23/2016 9.4  4.6 - 10.2 K/uL Final  . Lymph, poc 04/23/2016 3.1  0.6 - 3.4 Final  . POC LYMPH PERCENT 04/23/2016 33.2  10 - 50 %L Final  . MID (cbc) 04/23/2016 0.6  0 - 0.9 Final  . POC MID % 04/23/2016 6.2  0 - 12 %M Final  . POC Granulocyte 04/23/2016 5.7  2 - 6.9 Final  . Granulocyte percent 04/23/2016 60.6  37 - 80 %G Final  . RBC 04/23/2016 5.41  4.69 - 6.13 M/uL Final  . Hemoglobin 04/23/2016 15.3  14.1 - 18.1 g/dL Final  . HCT, POC 04/23/2016 42.7* 43.5 - 53.7 % Final  . MCV 04/23/2016  78.8* 80 - 97 fL Final  . MCH, POC 04/23/2016 28.3  27 - 31.2 pg Final  . MCHC 04/23/2016 35.9* 31.8 - 35.4 g/dL Final  . RDW, POC 04/23/2016 15.9  % Final  . Platelet Count, POC 04/23/2016 211  142 - 424 K/uL Final  . MPV 04/23/2016 8.4  0 - 99.8 fL Final  . POC Glucose 04/23/2016 102* 70 - 99 mg/dl Final  . Hemoglobin A1C 04/23/2016 6.1   Final  . Sodium 04/23/2016 142  135 - 146 mmol/L Final  . Potassium 04/23/2016 4.7  3.5 - 5.3 mmol/L Final  . Chloride 04/23/2016 106  98 - 110 mmol/L Final  . CO2 04/23/2016 25  20 - 31 mmol/L Final  . Glucose, Bld 04/23/2016 108* 65 - 99 mg/dL Final  . BUN 04/23/2016 20  7 - 25 mg/dL Final  . Creat 04/23/2016 1.26* 0.70 - 1.25 mg/dL Final   Comment:     For patients > or = 63 years of age: The upper reference limit for Creatinine is approximately 13% higher for people identified as African-American.     . Total Bilirubin 04/23/2016 0.7  0.2 - 1.2 mg/dL Final  . Alkaline Phosphatase 04/23/2016 67  40 - 115 U/L Final  . AST 04/23/2016 12  10 - 35 U/L Final  . ALT 04/23/2016 13  9 - 46 U/L Final  . Total Protein 04/23/2016 7.0  6.1 - 8.1 g/dL Final  . Albumin 04/23/2016 4.3  3.6 - 5.1 g/dL Final  . Calcium 04/23/2016 9.3  8.6 - 10.3 mg/dL Final  . GFR, Est African American 04/23/2016 70  >=60 mL/min Final  . GFR, Est Non African American 04/23/2016 60  >=60 mL/min Final  . Cholesterol 04/23/2016 164  125 - 200 mg/dL Final  . Triglycerides 04/23/2016 175* <150 mg/dL Final  . HDL 04/23/2016 32* >=40 mg/dL Final  . Total CHOL/HDL Ratio 04/23/2016 5.1* <=5.0 Ratio Final  . VLDL 04/23/2016 35* <30 mg/dL Final  . LDL Cholesterol 04/23/2016 97  <130 mg/dL Final   Comment:   Total Cholesterol/HDL Ratio:CHD Risk                        Coronary Heart Disease Risk Table                                        Men       Women          1/2 Average Risk              3.4        3.3              Average Risk              5.0        4.4           2X Average Risk              9.6        7.1           3X Average Risk             23.4       11.0 Use the calculated Patient Ratio above and the CHD Risk table  to determine the patient's CHD Risk.   [name] HCV Ab 04/23/2016 NEGATIVE  NEGATIVE Final  . Vitamin B-12 04/23/2016 364  200 - 1,100 pg/mL Final  . Vit D, 25-Hydroxy 04/24/2016 36  30 - 100 ng/mL Final   Comment: Vitamin D Status           25-OH Vitamin D        Deficiency                <20 ng/mL        Insufficiency         20 - 29 ng/mL        Optimal             > or = 30 ng/mL   For 25-OH Vitamin D testing on patients on D2-supplementation and patients for whom quantitation of D2 and D3 fractions is required, the [facility] 25-OH VIT D,  (D2,D3), LC/MS/MS is recommended: order code [phone] (patients > 2 yrs).   . Ferritin 04/23/2016 98  20 - 380 ng/mL Final  . Magnesium 04/23/2016 2.0  1.5 - 2.5 mg/dL Final    Physical Examination:  BP 117/61   Pulse 65   Ht _0  (1.803 m)   Wt 294 lb (133.4 kg)   BMI 41.00 kg/m           ASSESSMENT:   Diabetes type 2, uncontrolled   See history of present illness for detailed discussion of his current management, blood sugar patterns and problems identified    Currently on a regimen of 8.0EM Trulicity along with glipizide ER 2.5 mg daily, Invokana 100 mg and metformin 2.0 g daily. His blood sugars have  been excellent on his home monitoring and his A1c is 6.1 Has only sporadic occasional high blood sugars  Currently not able to exercise because of his atrial fibrillation  HYPERTENSION: Blood pressure is low normal without orthostasis but since there is some concern about his creatinine being slightly higher he should be able to reduce his losartan with benefit  PLAN:   No change in medications  Encouraged him to start exercising as tolerated  Consultation with dietitian to be done  Reduce LOSARTAN to half tablet  Follow-up in 4 months   Patient Instructions  More sugars after supper  Check blood sugars on waking up every 2 days   Also check blood sugars about 2 hours after a meal and do this after different meals by rotation  Recommended blood sugar levels on waking up is 90-130 and about 2 hours after meal is 130-160  Please bring your blood sugar monitor to each visit, thank you  Losartan 1/2 tab daily      Chaylee Ehrsam 04/28/2016, 9:02 PM   Note: This office note was prepared with Estate agent. Any transcriptional errors that result from this process are unintentional.

## 2016-04-28 NOTE — Telephone Encounter (Signed)
Patient is calling because his renal levels are low and Dr. Louis Meckel from [facility] and asking that we send the lab work. Patient does not have th fax information. Patient also needs paperwork printed from his last CPE so that he can give it to his insurance. Patient wants to pick it up Wednesday.

## 2016-04-28 NOTE — Patient Instructions (Addendum)
More sugars after supper  Check blood sugars on waking up every 2 days   Also check blood sugars about 2 hours after a meal and do this after different meals by rotation  Recommended blood sugar levels on waking up is 90-130 and about 2 hours after meal is 130-160  Please bring your blood sugar monitor to each visit, thank you  Losartan 1/2 tab daily

## 2016-04-30 ENCOUNTER — Encounter: Payer: Self-pay | Admitting: Pulmonary Disease

## 2016-04-30 ENCOUNTER — Ambulatory Visit (INDEPENDENT_AMBULATORY_CARE_PROVIDER_SITE_OTHER): Payer: BLUE CROSS/BLUE SHIELD | Admitting: Pulmonary Disease

## 2016-04-30 VITALS — BP 124/72 | HR 67 | Ht 71.0 in | Wt 295.6 lb

## 2016-04-30 DIAGNOSIS — R599 Enlarged lymph nodes, unspecified: Secondary | ICD-10-CM | POA: Diagnosis not present

## 2016-04-30 DIAGNOSIS — R590 Localized enlarged lymph nodes: Secondary | ICD-10-CM

## 2016-04-30 NOTE — Progress Notes (Signed)
MARQUAL MI    [id number]    1951/06/17  Primary Care Physician:DAUB, Lina Sayre, MD  Referring Physician: Darlyne Russian, MD [address], [place] [address]  Chief complaint:  Consult for evaluation of abnormal CT scan.  HPI: Mr. 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 He had an x-ray recently that showed suggestion of lung nodule. He had a subsequent CT scan of the chest which did not show any lung nodules but he has an anterior mediastinal lymph node. He has occasional dyspnea on exertion and at rest. No cough, sputum production, wheeze. He denies any fevers, chills.  His diagnosed with stage TIIa adenocarcinoma in 2015. He went into an XRT last year and his last PSA was apparently normal. 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 He had a V/Q scan a few years ago which was negative for pulmonary embolism.   Outpatient Encounter Prescriptions as of 04/30/2016  Medication Sig  . amLODipine (NORVASC) 2.5 MG tablet Take 1 tablet (2.5 mg total) by mouth daily.  [name] atorvastatin (LIPITOR) 40 MG tablet TAKE ONE TABLET BY MOUTH ONCE DAILY.  . blood glucose meter kit and supplies KIT Test blood sugar daily as directed. Dx code: E11.9  . canagliflozin (INVOKANA) 100 MG TABS tablet 1 tablet before breakfast  . CINNAMON PO Take 1,000 mg by mouth daily.  [name] glipiZIDE (GLUCOTROL XL) 2.5 MG 24 hr tablet Take 1 tablet (2.5 mg total) by mouth daily with breakfast.  . glucose blood test strip Check twice daily. DX: E11.65 One touch ultra Blue  . losartan (COZAAR) 100 MG tablet TAKE ONE TABLET BY MOUTH ONCE DAILY  . metFORMIN (GLUCOPHAGE XR) 500 MG 24 hr tablet Take 2 tablets twice daily  . metoprolol tartrate (LOPRESSOR) 25 MG tablet Take 1 tablet (25 mg total) by mouth 2 (two) times daily.  . Omega-3 Fatty Acids (FISH OIL) 1000  MG CAPS Take 1,000 mg by mouth 2 (two) times daily.  [name] omeprazole (PRILOSEC) 40 MG capsule One tablet daily  . rivaroxaban (XARELTO) 20 MG TABS tablet TAKE ONE TABLET BY MOUTH ONCE DAILY WITH SUPPER  . TRULICITY 1.5 SP/1.9CK SOPN Inject contents of one(1) pen per week  . Vitamin D, Cholecalciferol, 400 UNITS TABS Take 400 Units by mouth daily.   No facility-administered encounter medications on file as of 04/30/2016.     Allergies as of 04/30/2016  . (No Known Allergies)    Past Medical History:  Diagnosis Date  . Diabetes mellitus without complication ([place])   . DVT (deep venous thrombosis) ([facility])    left leg.  [name] GERD (gastroesophageal reflux disease)   . Hypercholesterolemia   . Hypertension   . Prostate cancer ([place]) 12/06/13   gleason 4+3=7, 6/12 cores positive. seed implant and radiation    Past Surgical History:  Procedure Laterality Date  . APPENDECTOMY    . BALLOON DILATION N/A 08/02/2015   Procedure: BALLOON DILATION;  Surgeon: [name], MD;  Location: [facility];  Service: Endoscopy;  Laterality: N/A;  . COLONOSCOPY WITH PROPOFOL N/A 08/02/2015   Procedure: COLONOSCOPY WITH PROPOFOL w/ APC;  Surgeon: [name], MD;  Location: [facility];  Service: Endoscopy;  Laterality: N/A;  . ESOPHAGOGASTRODUODENOSCOPY (EGD) WITH PROPOFOL N/A 08/02/2015   Procedure: ESOPHAGOGASTRODUODENOSCOPY (EGD) WITH PROPOFOL/ possible dilation.;  Surgeon: [name], MD;  Location: [facility];  Service: Endoscopy;  Laterality: N/A;  . KNEE SURGERY     right knee orthoscopic  . PROSTATE BIOPSY  12/06/13   Gleason 4+3=7, volume 35 gm  . [place]  . TONSILLECTOMY      Family History  Problem Relation Age of Onset  . Heart disease Mother     before age 58  . Diabetes Mother   . Hyperlipidemia Mother   . Varicose Veins Mother   . Heart disease Father   . Diabetes Father   . Cancer Neg Hx     Social History   Social History  .  Marital status: Married    Spouse name: N/A  . Number of children: 1  . Years of education: N/A   Occupational History  .  Retired   Social History Main Topics  . Smoking status: Former Smoker    Packs/day: 0.50    Years: 23.00    Types: Cigarettes    Quit date: 08/01/2012  . Smokeless tobacco: Never Used  . Alcohol use 0.0 oz/week     Comment: very seldom, maybe 3x per year  . Drug use: No  . Sexual activity: Yes    Birth control/ protection: Abstinence   Other Topics Concern  . 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  HENT: Negative.   Eyes: Negative for blurred vision.  Respiratory: as per HPI  Cardiovascular: Negative for chest pain and palpitations.  Gastrointestinal: Negative for vomiting, diarrhea, blood per rectum. Genitourinary: Negative for dysuria, urgency, frequency and hematuria.  Musculoskeletal: Negative for myalgias, back pain and joint pain.  Skin: Negative for itching and rash.  Neurological: Negative for dizziness, tremors, focal weakness, seizures and loss of consciousness.  Endo/Heme/Allergies: Negative for environmental allergies.  Psychiatric/Behavioral: Negative for depression, suicidal ideas and hallucinations.  All other systems reviewed and are negative.   Physical Exam: Blood pressure 124/72, pulse 67, height '5\' 11"'  (1.803 m), weight 295 lb 9.6 oz (134.1 kg), SpO2 96 %. Gen:      No acute distress HEENT:  EOMI, sclera anicteric Neck:     No masses; no thyromegaly Lungs:    Clear to auscultation bilaterally; normal respiratory effort CV:         Regular rate and rhythm; no murmurs Abd:      + bowel sounds;  soft, non-tender; no palpable masses, no distension Ext:    No edema; adequate peripheral perfusion Skin:      Warm and dry; no rash Neuro: alert and oriented x 3 Psych: normal mood and affect  Data Reviewed: V/Q scan 01/15/16- Low probability for pulmonary embolism.  CT scan 04/01/16- no lung mass or nodule, anterior mediastinal lymph node 14 into 24 mm.  Assessment:  65 year old here for evaluation of an anterior mediastinal lymph node. His CT scan images were reviewed. There is no evidence of any pulmonary nodule or abnormality to explain the lymph node enlargement. He does not have any evidence of an infectious process. He is at high risk given his smoking  history and prostate cancer (last PSA was normal 2 weeks ago). The location of the mass in the anterior mediastinum adjacent to the aorta makes it difficult to biopsy either by CT-guided approach or bronchoscope.   He will need close follow-up with a repeat imaging in 4 months. I will do a PET scan at this point to get additional information about metabolic activity. If persistent or positive on the PET scan and then he will need a cardiac thoracic referral for further evaluation. I'll also get pulmonary function tests to get a baseline evaluation of his lung function.  Plan/Recommendations: - PET scan follow up - PFTs  Return after scan to reassess.  Marshell Garfinkel MD [facility] Pager [phone] 04/30/2016, 9:28 AM  CC: Darlyne Russian, MD

## 2016-04-30 NOTE — Patient Instructions (Signed)
We will schedule her for a follow-up scan 4 months from now. He will return to clinic after the scan to review results and plan for further testing if needed.

## 2016-05-22 DIAGNOSIS — M65331 Trigger finger, right middle finger: Secondary | ICD-10-CM | POA: Diagnosis not present

## 2016-05-23 ENCOUNTER — Telehealth: Payer: Self-pay

## 2016-05-23 NOTE — Telephone Encounter (Signed)
Dr Everlene Farrier   Dr. Milly Jakob would like to discuss this patient with you  His cell is  [phone] Call anytime.

## 2016-05-26 NOTE — Telephone Encounter (Signed)
I called and spoke with Dr. Grandville Silos. He returned my call. I told him I was not comfortable with Tommy Yang stopping his erectile toe. Dr. Grandville Silos understands. I was concerned because of patient's residual clot in the left leg.

## 2016-05-29 ENCOUNTER — Telehealth: Payer: Self-pay

## 2016-05-29 NOTE — Telephone Encounter (Signed)
PATIENT STATES DR. DAUB WAS HIS PCP AND HE WANTED HIM TO KNOW THAT HE IS SCHEDULED TO HAVE SURGERY ON  HIS TRIGGER FINGER OCT. 7, 2017. BEFORE HIS SURGERY THE OFFICE TOLD HIM TO STOP TAKING HIS XARELTO. HE SAID DR. DAUB TOLD HIM NOT TO STOP TAKING IT. HE WANTS TO KNOW WHAT HE SHOULD DO? HE SAID DR. DAUB WAS GOING TO HAVE DR. GREENE TO BECOME HIS PCP SO HE WOULD LIKE TO TALK TO HIM ABOUT IT. BEST PHONE [phone] (CELL) [place]

## 2016-05-30 NOTE — Telephone Encounter (Signed)
Please f/u

## 2016-06-01 NOTE — Telephone Encounter (Signed)
Please call him back and let him know that he needs to follow their advise and should stop the medication 2 days before the procedure.  Okay to resume 3 days after however I am sure they will be advising him.  Philis Fendt, MS, PA-C 1:14 PM, 06/01/2016

## 2016-06-03 ENCOUNTER — Other Ambulatory Visit: Payer: Self-pay | Admitting: Orthopedic Surgery

## 2016-06-17 ENCOUNTER — Ambulatory Visit (HOSPITAL_BASED_OUTPATIENT_CLINIC_OR_DEPARTMENT_OTHER)
Admission: RE | Admit: 2016-06-17 | Payer: BLUE CROSS/BLUE SHIELD | Source: Ambulatory Visit | Admitting: Orthopedic Surgery

## 2016-06-17 ENCOUNTER — Encounter (HOSPITAL_BASED_OUTPATIENT_CLINIC_OR_DEPARTMENT_OTHER): Admission: RE | Payer: Self-pay | Source: Ambulatory Visit

## 2016-06-17 SURGERY — RELEASE, A1 PULLEY, FOR TRIGGER FINGER
Anesthesia: Monitor Anesthesia Care | Site: Finger | Laterality: Right

## 2016-06-18 ENCOUNTER — Telehealth: Payer: Self-pay | Admitting: Emergency Medicine

## 2016-06-20 ENCOUNTER — Encounter (INDEPENDENT_AMBULATORY_CARE_PROVIDER_SITE_OTHER): Payer: BLUE CROSS/BLUE SHIELD | Admitting: Pulmonary Disease

## 2016-06-20 DIAGNOSIS — R590 Localized enlarged lymph nodes: Secondary | ICD-10-CM | POA: Diagnosis not present

## 2016-06-20 LAB — PULMONARY FUNCTION TEST
DL/VA % pred: 100 %
DL/VA: 4.65 ml/min/mmHg/L
DLCO cor % pred: 94 %
DLCO cor: 30.66 ml/min/mmHg
DLCO unc % pred: 95 %
DLCO unc: 30.75 ml/min/mmHg
FEF 25-75 Post: 2.28 L/sec
FEF 25-75 Pre: 1.8 L/sec
FEF2575-%Change-Post: 26 %
FEF2575-%Pred-Post: 81 %
FEF2575-%Pred-Pre: 64 %
FEV1-%Change-Post: 7 %
FEV1-%Pred-Post: 86 %
FEV1-%Pred-Pre: 80 %
FEV1-Post: 3.02 L
FEV1-Pre: 2.81 L
FEV1FVC-%Change-Post: -3 %
FEV1FVC-%Pred-Pre: 92 %
FEV6-%Change-Post: 10 %
FEV6-%Pred-Post: 99 %
FEV6-%Pred-Pre: 90 %
FEV6-Post: 4.42 L
FEV6-Pre: 4.01 L
FEV6FVC-%Change-Post: 0 %
FEV6FVC-%Pred-Post: 103 %
FEV6FVC-%Pred-Pre: 104 %
FVC-%Change-Post: 10 %
FVC-%Pred-Post: 96 %
FVC-%Pred-Pre: 87 %
FVC-Post: 4.49 L
FVC-Pre: 4.05 L
Post FEV1/FVC ratio: 67 %
Post FEV6/FVC ratio: 98 %
Pre FEV1/FVC ratio: 69 %
Pre FEV6/FVC Ratio: 99 %
RV % pred: 144 %
RV: 3.36 L
TLC % pred: 106 %
TLC: 7.44 L

## 2016-07-08 ENCOUNTER — Ambulatory Visit (INDEPENDENT_AMBULATORY_CARE_PROVIDER_SITE_OTHER): Payer: BLUE CROSS/BLUE SHIELD | Admitting: Pulmonary Disease

## 2016-07-08 ENCOUNTER — Encounter: Payer: Self-pay | Admitting: Pulmonary Disease

## 2016-07-08 VITALS — BP 128/68 | HR 78 | Ht 71.0 in | Wt 295.6 lb

## 2016-07-08 DIAGNOSIS — R590 Localized enlarged lymph nodes: Secondary | ICD-10-CM | POA: Diagnosis not present

## 2016-07-08 MED ORDER — TIOTROPIUM BROMIDE MONOHYDRATE 1.25 MCG/ACT IN AERS: 2.0000 | INHALATION_SPRAY | Freq: Every day | RESPIRATORY_TRACT | 4 refills | Status: DC

## 2016-07-08 NOTE — Patient Instructions (Signed)
We will cancel the PET scan and schedule for a CT of chest without contrast We will start you on Stiolto inhaler. Return to clinic after scan for review.

## 2016-07-08 NOTE — Progress Notes (Signed)
Tommy Yang    [id number]    [birth year]  Primary Care Physician:GREENE,JEFFREY R, MD  Referring Physician: Darlyne Russian, MD [address] [place], [place] [address]  Chief complaint:  Follow up evaluation of abnormal CT scan.  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 He had an x-ray recently that showed suggestion of lung nodule. He had a subsequent CT scan of the chest which did not show any lung nodules but he has an anterior mediastinal lymph node. He has occasional dyspnea on exertion and at rest. No cough, sputum production, wheeze. He denies any fevers, chills.  His diagnosed with stage TIIa adenocarcinoma in 2015. He went into an XRT last year and his last PSA was apparently normal. 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 He had a V/Q scan a few years ago which was negative for pulmonary embolism.   Outpatient Encounter Prescriptions as of 07/08/2016  Medication Sig  . amLODipine (NORVASC) 2.5 MG tablet Take 1 tablet (2.5 mg total) by mouth daily.  [name] atorvastatin (LIPITOR) 40 MG tablet TAKE ONE TABLET BY MOUTH ONCE DAILY.  . blood glucose meter kit and supplies KIT Test blood sugar daily as directed. Dx code: E11.9  . canagliflozin (INVOKANA) 100 MG TABS tablet 1 tablet before breakfast  . CINNAMON PO Take 1,000 mg by mouth daily.  [name] glipiZIDE (GLUCOTROL XL) 2.5 MG 24 hr tablet Take 1 tablet (2.5 mg total) by mouth daily with breakfast.  . glucose blood test strip Check twice daily. DX: E11.65 One touch ultra Blue  . losartan (COZAAR) 100 MG tablet TAKE ONE TABLET BY MOUTH ONCE DAILY  . metFORMIN (GLUCOPHAGE XR) 500 MG 24 hr tablet Take 2 tablets twice daily  . metoprolol tartrate (LOPRESSOR) 25 MG tablet Take 1 tablet (25 mg total) by mouth 2 (two) times daily.  . Omega-3 Fatty Acids (FISH OIL) 1000  MG CAPS Take 1,000 mg by mouth 2 (two) times daily.  [name] omeprazole (PRILOSEC) 40 MG capsule One tablet daily  . rivaroxaban (XARELTO) 20 MG TABS tablet TAKE ONE TABLET BY MOUTH ONCE DAILY WITH SUPPER  . TRULICITY 1.5 YH/0.6CB SOPN Inject contents of one(1) pen per week  . Vitamin D, Cholecalciferol, 400 UNITS TABS Take 400 Units by mouth daily.   No facility-administered encounter medications on file as of 07/08/2016.     Allergies as of 07/08/2016  . (No Known Allergies)    Past Medical History:  Diagnosis Date  . Diabetes mellitus without complication ([place])   . DVT (deep venous thrombosis) ([facility])    left leg.  [name] GERD (gastroesophageal reflux disease)   . Hypercholesterolemia   . Hypertension   . Prostate cancer ([place]) 12/06/13   gleason 4+3=7, 6/12 cores positive. seed implant and radiation    Past Surgical History:  Procedure Laterality Date  . APPENDECTOMY    . BALLOON DILATION N/A 08/02/2015   Procedure: BALLOON DILATION;  Surgeon: [name], MD;  Location: [facility];  Service: Endoscopy;  Laterality: N/A;  . COLONOSCOPY WITH PROPOFOL N/A 08/02/2015   Procedure: COLONOSCOPY WITH PROPOFOL w/ APC;  Surgeon: [name], MD;  Location: [facility];  Service: Endoscopy;  Laterality: N/A;  . ESOPHAGOGASTRODUODENOSCOPY (EGD) WITH PROPOFOL N/A 08/02/2015   Procedure: ESOPHAGOGASTRODUODENOSCOPY (EGD) WITH PROPOFOL/ possible dilation.;  Surgeon: [name], MD;  Location:  [facility];  Service: Endoscopy;  Laterality: N/A;  . KNEE SURGERY     right knee orthoscopic  . PROSTATE BIOPSY  12/06/13   Gleason 4+3=7, volume 35 gm  . [place]  . TONSILLECTOMY      Family History  Problem Relation Age of Onset  . Heart disease Mother     before age 52  . Diabetes Mother   . Hyperlipidemia Mother   . Varicose Veins Mother   . Heart disease Father   . Diabetes Father   . Cancer Neg Hx     Social History   Social History  .  Marital status: Married    Spouse name: N/A  . Number of children: 1  . Years of education: N/A   Occupational History  .  Retired   Social History Main Topics  . Smoking status: Former Smoker    Packs/day: 0.50    Years: 23.00    Types: Cigarettes    Quit date: 08/01/2012  . Smokeless tobacco: Never Used  . Alcohol use 0.0 oz/week     Comment: very seldom, maybe 3x per year  . Drug use: No  . Sexual activity: Yes    Birth control/ protection: Abstinence   Other Topics Concern  . 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  HENT: Negative.   Eyes: Negative for blurred vision.  Respiratory: as per HPI  Cardiovascular: Negative for chest pain and palpitations.  Gastrointestinal: Negative for vomiting, diarrhea, blood per rectum. Genitourinary: Negative for dysuria, urgency, frequency and hematuria.  Musculoskeletal: Negative for myalgias, back pain and joint pain.  Skin: Negative for itching and rash.  Neurological: Negative for dizziness, tremors, focal weakness, seizures and loss of consciousness.  Endo/Heme/Allergies: Negative for environmental allergies.  Psychiatric/Behavioral: Negative for depression, suicidal ideas and hallucinations.  All other systems reviewed and are negative.   Physical Exam: Blood pressure 124/72, pulse 67, height '5\' 11"'  (1.803 m), weight 295 lb 9.6 oz (134.1 kg), SpO2 96 %. Gen:      No acute distress HEENT:  EOMI, sclera anicteric Neck:     No masses; no thyromegaly Lungs:    Clear to auscultation bilaterally; normal respiratory effort CV:         Regular rate and rhythm; no murmurs Abd:      + bowel sounds;  soft, non-tender; no palpable masses, no distension Ext:    No edema; adequate peripheral perfusion Skin:      Warm and dry; no rash Neuro: alert and oriented x 3 Psych: normal mood and affect  Data Reviewed: V/Q scan 01/15/16- Low probability for pulmonary embolism.  CT scan 04/01/16- no lung mass or nodule, anterior mediastinal lymph node 14 x 24 mm. All images reviewed  PFTs 06/20/16 FVC 4.49 (96%) FEV1 3.02 (86%) F/F 67 TLC 106% DLCO 95% Mild obstructive airway disease  Assessment:  #1 Ant mediastinal mass 65 year old here for evaluation of an anterior mediastinal lymph node. His CT scan images were reviewed. There is no evidence of any pulmonary nodule or  abnormality to explain the lymph node enlargement. He does not have any evidence of an infectious process. He is at high risk given his smoking history and prostate cancer (last PSA was normal 2 weeks ago). The location of the mass in the anterior mediastinum adjacent to the aorta makes it difficult to biopsy either by CT-guided approach or bronchoscope.     He will need close follow-up with a repeat imaging. I  ordered a PET scan scan but he is claustrophobic and does not want to get the tracer. He is also concerned about the cost. I will get a repeat CT scan instead. If persistent  he will need a cardiac thoracic referral for further evaluation.   #2 COPD PFTs reviewed. He has mild COPD. I will try him on Stiolto as he has complaints of dyspnea on exertion.   Plan/Recommendations: - CT scan follow up - Start stiolto  Addendum:  Stiolo not on formulary. Start spiriva  Return after scan to reassess.  Marshell Garfinkel MD [facility] Pager [phone] 07/08/2016, 11:07 AM  CC: Darlyne Russian, MD

## 2016-07-14 ENCOUNTER — Ambulatory Visit
Admission: RE | Admit: 2016-07-14 | Discharge: 2016-07-14 | Disposition: A | Discharge status: Home / Self Care | Payer: BLUE CROSS/BLUE SHIELD | Source: Ambulatory Visit | Attending: Pulmonary Disease | Admitting: Pulmonary Disease

## 2016-07-14 DIAGNOSIS — R590 Localized enlarged lymph nodes: Secondary | ICD-10-CM

## 2016-07-14 DIAGNOSIS — R911 Solitary pulmonary nodule: Secondary | ICD-10-CM | POA: Diagnosis not present

## 2016-07-14 DIAGNOSIS — C61 Malignant neoplasm of prostate: Secondary | ICD-10-CM | POA: Diagnosis not present

## 2016-07-16 ENCOUNTER — Other Ambulatory Visit: Payer: Self-pay | Admitting: Pulmonary Disease

## 2016-07-16 DIAGNOSIS — J9859 Other diseases of mediastinum, not elsewhere classified: Secondary | ICD-10-CM

## 2016-07-16 NOTE — Progress Notes (Signed)
Called spoke with patient, advised of CT results / recs as stated by PM.  Pt verbalized his understanding and denied any questions.  Orders only encounter created for the referral to cardiothoracic surgery

## 2016-07-19 ENCOUNTER — Other Ambulatory Visit: Payer: Self-pay | Admitting: Endocrinology

## 2016-07-22 ENCOUNTER — Encounter: Payer: Self-pay | Admitting: Thoracic Surgery (Cardiothoracic Vascular Surgery)

## 2016-07-22 ENCOUNTER — Institutional Professional Consult (permissible substitution) (INDEPENDENT_AMBULATORY_CARE_PROVIDER_SITE_OTHER): Payer: BLUE CROSS/BLUE SHIELD | Admitting: Thoracic Surgery (Cardiothoracic Vascular Surgery)

## 2016-07-22 VITALS — BP 170/89 | HR 64 | Resp 16 | Ht 71.0 in | Wt 290.0 lb

## 2016-07-22 DIAGNOSIS — J9859 Other diseases of mediastinum, not elsewhere classified: Secondary | ICD-10-CM | POA: Diagnosis not present

## 2016-07-22 NOTE — Progress Notes (Signed)
PCP is Wendie Agreste, MD Referring Provider is Marshell Garfinkel, MD  Chief Complaint  Patient presents with  . Mediastinal Mass    Surgical eval, Chest CT 07/14/16, PFT's 06/20/16    HPI: Tommy Yang is a 65 year old woman sent for evaluation of an anterior mediastinal fluid collection.  Tommy Yang is a 65 year old gentleman with a history of prostate cancer, DVT, hypertension, hyperlipidemia, type 2 diabetes, atrial fibrillation and gastroesophageal reflux. He also had a history of tobacco abuse smoking about one half pack of cigarettes daily for 23 years prior to quitting 3 years ago.  In August he had a CT of the chest to follow-up a possible lung nodule seen on chest x-ray. There was no lung nodule, but it was read as showing an enlarged anterior mediastinal lymph node just anterior to the aorta. A repeat CT was done on 07/14/2016. This area had fluid density and was felt to be a pericardial recess.  Past Medical History:  Diagnosis Date  . Diabetes mellitus without complication ([place])   . DVT (deep venous thrombosis) ([facility])    left leg.  [name] GERD (gastroesophageal reflux disease)   . Hypercholesterolemia   . Hypertension   . Prostate cancer ([place]) 12/06/13   gleason 4+3=7, 6/12 cores positive. seed implant and radiation    Past Surgical History:  Procedure Laterality Date  . APPENDECTOMY    . BALLOON DILATION N/A 08/02/2015   Procedure: BALLOON DILATION;  Surgeon: [name], MD;  Location: [facility];  Service: Endoscopy;  Laterality: N/A;  . COLONOSCOPY WITH PROPOFOL N/A 08/02/2015   Procedure: COLONOSCOPY WITH PROPOFOL w/ APC;  Surgeon: [name], MD;  Location: [facility];  Service: Endoscopy;  Laterality: N/A;  . ESOPHAGOGASTRODUODENOSCOPY (EGD) WITH PROPOFOL N/A 08/02/2015   Procedure: ESOPHAGOGASTRODUODENOSCOPY (EGD) WITH PROPOFOL/ possible dilation.;  Surgeon: [name], MD;  Location: [facility];  Service: Endoscopy;  Laterality: N/A;  . KNEE  SURGERY     right knee orthoscopic  . PROSTATE BIOPSY  12/06/13   Gleason 4+3=7, volume 35 gm  . [place]  . TONSILLECTOMY      Family History  Problem Relation Age of Onset  . Heart disease Mother     before age 61  . Diabetes Mother   . Hyperlipidemia Mother   . Varicose Veins Mother   . Heart disease Father   . Diabetes Father   . Cancer Neg Hx     Social History Social History  Substance Use Topics  . Smoking status: Former Smoker    Packs/day: 0.50    Years: 23.00    Types: Cigarettes    Quit date: 08/01/2012  . Smokeless tobacco: Never Used  . Alcohol use 0.0 oz/week     Comment: very seldom, maybe 3x per year    Current Outpatient Prescriptions  Medication Sig Dispense Refill  . amLODipine (NORVASC) 2.5 MG tablet Take 1 tablet (2.5 mg total) by mouth daily. 90 tablet 3  . atorvastatin (LIPITOR) 40 MG tablet TAKE ONE TABLET BY MOUTH ONCE DAILY. 90 tablet 3  . blood glucose meter kit and supplies KIT Test blood sugar daily as directed. Dx code: E11.9 1 each 0  . CINNAMON PO Take 1,000 mg by mouth daily.    [name] glipiZIDE (GLUCOTROL XL) 2.5 MG 24 hr tablet Take 1 tablet (2.5 mg total) by mouth daily with breakfast. 90 tablet 1  . glucose blood test strip Check twice daily. DX:  E11.65 One touch ultra Blue 200 each 1  . INVOKANA 100 MG TABS tablet TAKE 1 TABLET BEFORE BREAKFAST 90 tablet 1  . losartan (COZAAR) 100 MG tablet TAKE ONE TABLET BY MOUTH ONCE DAILY 90 tablet 3  . metFORMIN (GLUCOPHAGE XR) 500 MG 24 hr tablet Take 2 tablets twice daily 360 tablet 1  . metoprolol tartrate (LOPRESSOR) 25 MG tablet Take 1 tablet (25 mg total) by mouth 2 (two) times daily. 180 tablet 3  . Omega-3 Fatty Acids (FISH OIL) 1000 MG CAPS Take 1,000 mg by mouth 2 (two) times daily.    [name] omeprazole (PRILOSEC) 40 MG capsule One tablet daily 90 capsule 3  . rivaroxaban (XARELTO) 20 MG TABS tablet TAKE ONE TABLET BY MOUTH ONCE DAILY WITH SUPPER 90  tablet 3  . Tiotropium Bromide Monohydrate (SPIRIVA RESPIMAT) 1.25 MCG/ACT AERS Inhale 2 puffs into the lungs daily. 4 g 4  . TRULICITY 1.5 ZG/0.1VC SOPN Inject contents of one(1) pen per week 12 pen 1  . Vitamin D, Cholecalciferol, 400 UNITS TABS Take 400 Units by mouth daily.     No current facility-administered medications for this visit.     No Known Allergies  Review of Systems  Constitutional: Negative for activity change and unexpected weight change.  Respiratory: Positive for shortness of breath.   Cardiovascular: Negative for chest pain.  Gastrointestinal: Positive for abdominal pain (Reflux). Negative for blood in stool.    BP (!) 170/89 (BP Location: Left Arm, Patient Position: Sitting, Cuff Size: Large)   Pulse 64   Resp 16   Ht '5\' 11"'  (1.803 m)   Wt 290 lb (131.5 kg)   SpO2 96% Comment: RA  BMI 40.45 kg/m  Physical Exam  Constitutional: He is oriented to person, place, and time.  Obese  HENT:  Head: Normocephalic and atraumatic.  Mouth/Throat: No oropharyngeal exudate.  Eyes: Conjunctivae and EOM are normal. No scleral icterus.  Neck: No JVD present.  Cardiovascular: Normal rate, regular rhythm and normal heart sounds.   Pulmonary/Chest: Effort normal and breath sounds normal. No respiratory distress. He has no wheezes. He has no rales.  Abdominal: Soft. There is no tenderness.  Musculoskeletal: He exhibits no edema.  Lymphadenopathy:    He has no cervical adenopathy.  Neurological: He is alert and oriented to person, place, and time. No cranial nerve deficit.  Skin: Skin is warm and dry.  Vitals reviewed.    Diagnostic Tests: CT CHEST WITHOUT CONTRAST  TECHNIQUE: Multidetector CT imaging of the chest was performed following the standard protocol without IV contrast.  COMPARISON:  04/01/2016.  FINDINGS: Cardiovascular: Atherosclerotic calcification of the arterial vasculature, including coronary arteries. Heart size within normal limits. No  pericardial effusion.  Mediastinum/Nodes: Thyroid appears mildly heterogeneous. Previously described prevascular lymph node measures fluid in density and is indicative of a pericardial recess. No pathologically enlarged mediastinal or axillary lymph nodes. Hilar regions are difficult to definitively evaluate without IV contrast. Esophagus is grossly unremarkable.  Lungs/Pleura: Mild scarring in the right lower lobe, adjacent to an elevated right hemidiaphragm. A 5 mm subpleural nodule along the left hemidiaphragm is unchanged. A subpleural lymph node is most likely.  Upper Abdomen: Visualized portions of the liver, gallbladder, adrenal glands, kidneys, spleen, pancreas, stomach and bowel are grossly unremarkable the exception of a tiny hiatal hernia. No upper abdominal adenopathy.  Musculoskeletal: No worrisome lytic or sclerotic lesions. Degenerative changes are seen in the spine and right shoulder.  IMPRESSION: 1. Previously measured anterior mediastinal mass is fluid  in density, is unchanged and most indicative of a pericardial recess. 2. Aortic atherosclerosis (ICD10-170.0). Coronary artery calcification.   Electronically Signed   By: Lorin Picket M.D.   On: 07/14/2016 09:45 I personally reviewed the CT chest and concur with the findings noted above.  Impression: Tommy Yang is a 65 year old gentleman who had an incidental anterior mediastinal finding on a CT that was done to evaluate for possible lung nodule. The area in question is over the proximal ascending aorta just above the heart. This an unusual location for a lymph node. This measures fluid density and is consistent with a pericardial recess. There is a similar fluid density just posterior to the aorta at the same level.  A pericardial recess is just an anatomic variant. There is no indication for intervention. This does not need any follow-up, unless there is concern other areas such as the 5 mm  subpleural nodule along the left hemidiaphragm.  Plan:  Follow-up with Dr. Florencia Reasons, MD [facility] [phone]

## 2016-08-04 ENCOUNTER — Ambulatory Visit (HOSPITAL_COMMUNITY): Payer: BLUE CROSS/BLUE SHIELD

## 2016-08-11 ENCOUNTER — Ambulatory Visit: Payer: BLUE CROSS/BLUE SHIELD | Admitting: Pulmonary Disease

## 2016-08-16 ENCOUNTER — Other Ambulatory Visit: Payer: Self-pay | Admitting: Endocrinology

## 2016-08-20 ENCOUNTER — Other Ambulatory Visit: Payer: Self-pay | Admitting: Endocrinology

## 2016-08-29 ENCOUNTER — Other Ambulatory Visit (INDEPENDENT_AMBULATORY_CARE_PROVIDER_SITE_OTHER): Payer: BLUE CROSS/BLUE SHIELD

## 2016-08-29 DIAGNOSIS — E119 Type 2 diabetes mellitus without complications: Secondary | ICD-10-CM

## 2016-08-29 LAB — COMPREHENSIVE METABOLIC PANEL
ALT: 20 U/L (ref 0–53)
AST: 13 U/L (ref 0–37)
Albumin: 4.2 g/dL (ref 3.5–5.2)
Alkaline Phosphatase: 71 U/L (ref 39–117)
BUN: 26 mg/dL — ABNORMAL HIGH (ref 6–23)
CO2: 29 mEq/L (ref 19–32)
Calcium: 9.4 mg/dL (ref 8.4–10.5)
Chloride: 103 mEq/L (ref 96–112)
Creatinine, Ser: 1.22 mg/dL (ref 0.40–1.50)
GFR: 63.58 mL/min (ref >60.00)
Glucose, Bld: 123 mg/dL — ABNORMAL HIGH (ref 70–99)
Potassium: 4.6 mEq/L (ref 3.5–5.1)
Sodium: 140 mEq/L (ref 135–145)
Total Bilirubin: 0.5 mg/dL (ref 0.2–1.2)
Total Protein: 7 g/dL (ref 6.0–8.3)

## 2016-08-29 LAB — MICROALBUMIN / CREATININE URINE RATIO
Creatinine,U: 106.4 mg/dL
Microalb Creat Ratio: 7.8 mg/g (ref 0.0–30.0)
Microalb, Ur: 8.3 mg/dL — ABNORMAL HIGH (ref 0.0–1.9)

## 2016-08-29 LAB — HEMOGLOBIN A1C: Hgb A1c MFr Bld: 6.5 % (ref 4.6–6.5)

## 2016-09-02 ENCOUNTER — Ambulatory Visit (INDEPENDENT_AMBULATORY_CARE_PROVIDER_SITE_OTHER): Payer: BLUE CROSS/BLUE SHIELD | Admitting: Endocrinology

## 2016-09-02 ENCOUNTER — Encounter: Payer: Self-pay | Admitting: Endocrinology

## 2016-09-02 VITALS — BP 106/80 | HR 89 | Ht 70.47 in | Wt 304.6 lb

## 2016-09-02 DIAGNOSIS — E119 Type 2 diabetes mellitus without complications: Secondary | ICD-10-CM | POA: Diagnosis not present

## 2016-09-02 DIAGNOSIS — I10 Essential (primary) hypertension: Secondary | ICD-10-CM | POA: Diagnosis not present

## 2016-09-02 MED ORDER — TRULICITY 1.5 MG/0.5ML ~~LOC~~ SOAJ: SUBCUTANEOUS | 1 refills | Status: DC

## 2016-09-02 MED ORDER — GLIPIZIDE ER 2.5 MG PO TB24: ORAL_TABLET | ORAL | 1 refills | Status: DC

## 2016-09-02 MED ORDER — CANAGLIFLOZIN 100 MG PO TABS: ORAL_TABLET | ORAL | 1 refills | Status: DC

## 2016-09-02 MED ORDER — METFORMIN HCL ER 500 MG PO TB24: ORAL_TABLET | ORAL | 1 refills | Status: DC

## 2016-09-02 NOTE — Patient Instructions (Signed)
Check blood sugars on waking up  2-3x per week  Also check blood sugars about 2 hours after a meal and do this after different meals by rotation  Recommended blood sugar levels on waking up is 80-130 and about 2 hours after meal is 130-160  Please bring your blood sugar monitor to each visit, thank you

## 2016-09-02 NOTE — Progress Notes (Signed)
Patient ID: Tommy Yang, male   DOB: 1951-01-28, 66 y.o.   MRN: [id number]           Reason for Appointment: Follow-up for Type 2 Diabetes   History of Present Illness:          Diagnosis: Type 2 diabetes mellitus, date of diagnosis: 05/2013       Past history:   He apparently had significant hyperglycemia in 2014 when seen in the urgent care center but did not establish with a PCP for control. He was started on treatment for his diabetes in 10/2013 and he was initially treated with metformin and Januvia A few months later he was also given glipizide ER to help his control when his A1c had gone up to 10.9 He says he was also seen by dietitian but no record available of this. Subsequently his blood sugar control had improved with A1c down to 6.8 in 9/15  Recent history:   On his consultation in 3/16 he had high blood sugars and weight gain; A1c was 9.1% with taking glipizide ER, Januvia and metformin.  With taking Trulicity instead of Januvia he has had consistent control  His A1c has been around 6.5 or less consistently now Did not bring his monitor for download today  Current blood sugar patterns and problems identified:  He has had fairly stable blood sugars and does not think they are high even after meals  No side effects from current medication regimen  He has had only one high blood sugar  Also he may get low normal sugars if he is skipping lunch  Still having difficulty losing weight and has gained some more weight since last visit  Currently not exercising but is planning to join the gym very soon   Oral hypoglycemic drugs the patient is taking are: Glipizide ER 2.5 mg in am, metformin 1 g a.m. and 1.5 g p.m. Side effects from medications have been: None Compliance with the medical regimen: Good  Hypoglycemia: None recently    Glucose monitoring:  done 1-2 times a day         Glucometer: One Touch.      Blood Glucose readings by recall   Mean values apply  above for all meters except median for One Touch  PRE-MEAL Fasting Lunch Dinner Bedtime Overall  Glucose range: 110-120   140s   Mean/median:         Self-care: The diet that the patient has been following is: tries to limit starches and fats .     Meals: 3 meals per day. Breakfast is sometimes cereal, frequently having soup for lunch            Exercise:  not Much  Dietician visit, most recent:?  2015 .               Weight history:    Wt Readings from Last 3 Encounters:  09/02/16 (!) 304 lb 9.6 oz (138.2 kg)  07/22/16 290 lb (131.5 kg)  07/08/16 295 lb 9.6 oz (134.1 kg)    Glycemic control:   Lab Results  Component Value Date   HGBA1C 6.5 08/29/2016   HGBA1C 6.1 04/23/2016   HGBA1C 6.6 (H) 12/06/2015   Lab Results  Component Value Date   MICROALBUR 8.3 (H) 08/29/2016   LDLCALC 97 04/23/2016   CREATININE 1.22 08/29/2016    Lab on 08/29/2016  Component Date Value Ref Range Status  . Sodium 08/29/2016 140  135 - 145 mEq/L Final  .  Potassium 08/29/2016 4.6  3.5 - 5.1 mEq/L Final  . Chloride 08/29/2016 103  96 - 112 mEq/L Final  . CO2 08/29/2016 29  19 - 32 mEq/L Final  . Glucose, Bld 08/29/2016 123* 70 - 99 mg/dL Final  . BUN 08/29/2016 26* 6 - 23 mg/dL Final  . Creatinine, Ser 08/29/2016 1.22  0.40 - 1.50 mg/dL Final  . Total Bilirubin 08/29/2016 0.5  0.2 - 1.2 mg/dL Final  . Alkaline Phosphatase 08/29/2016 71  39 - 117 U/L Final  . AST 08/29/2016 13  0 - 37 U/L Final  . ALT 08/29/2016 20  0 - 53 U/L Final  . Total Protein 08/29/2016 7.0  6.0 - 8.3 g/dL Final  . Albumin 08/29/2016 4.2  3.5 - 5.2 g/dL Final  . Calcium 08/29/2016 9.4  8.4 - 10.5 mg/dL Final  . GFR 08/29/2016 63.58  >60.00 mL/min Final  . Microalb, Ur 08/29/2016 8.3* 0.0 - 1.9 mg/dL Final  . Creatinine,U 08/29/2016 106.4  mg/dL Final  . Microalb Creat Ratio 08/29/2016 7.8  0.0 - 30.0 mg/g Final  . Hgb A1c MFr Bld 08/29/2016 6.5  4.6 - 6.5 % Final      Allergies as of 09/02/2016   No Known  Allergies     Medication List       Accurate as of 09/02/16  9:20 AM. Always use your most recent med list.          amLODipine 2.5 MG tablet Commonly known as:  NORVASC Take 1 tablet (2.5 mg total) by mouth daily.   atorvastatin 40 MG tablet Commonly known as:  LIPITOR TAKE ONE TABLET BY MOUTH ONCE DAILY.   blood glucose meter kit and supplies Kit Test blood sugar daily as directed. Dx code: E11.9   CINNAMON PO Take 1,000 mg by mouth daily.   Fish Oil 1000 MG Caps Take 1,000 mg by mouth 2 (two) times daily.   glipiZIDE 2.5 MG 24 hr tablet Commonly known as:  GLUCOTROL XL TAKE ONE TABLET BY MOUTH ONCE DAILY WITH BREAKFAST   glucose blood test strip Check twice daily. DX: E11.65 One touch ultra Blue   INVOKANA 100 MG Tabs tablet Generic drug:  canagliflozin TAKE 1 TABLET BEFORE BREAKFAST   losartan 100 MG tablet Commonly known as:  COZAAR TAKE ONE TABLET BY MOUTH ONCE DAILY   metFORMIN 500 MG 24 hr tablet Commonly known as:  GLUCOPHAGE XR Take 2 tablets twice daily   metoprolol tartrate 25 MG tablet Commonly known as:  LOPRESSOR Take 1 tablet (25 mg total) by mouth 2 (two) times daily.   omeprazole 40 MG capsule Commonly known as:  PRILOSEC One tablet daily   rivaroxaban 20 MG Tabs tablet Commonly known as:  XARELTO TAKE ONE TABLET BY MOUTH ONCE DAILY WITH SUPPER   Tiotropium Bromide Monohydrate 1.25 MCG/ACT Aers Commonly known as:  SPIRIVA RESPIMAT Inhale 2 puffs into the lungs daily.   TRULICITY 1.5 HK/7.4QV Sopn Generic drug:  Dulaglutide INJECT CONTENTS OF 1 PEN PER WEEK   Vitamin D (Cholecalciferol) 400 units Tabs Take 400 Units by mouth daily.       Allergies: No Known Allergies  Past Medical History:  Diagnosis Date  . Diabetes mellitus without complication ([place])   . DVT (deep venous thrombosis) ([facility])    left leg.  [name] GERD (gastroesophageal reflux disease)   . Hypercholesterolemia   . Hypertension   . Prostate cancer ([place]) 12/06/13     gleason 4+3=7, 6/12 cores positive. seed implant  and radiation    Past Surgical History:  Procedure Laterality Date  . APPENDECTOMY    . BALLOON DILATION N/A 08/02/2015   Procedure: BALLOON DILATION;  Surgeon: [name], MD;  Location: [facility];  Service: Endoscopy;  Laterality: N/A;  . COLONOSCOPY WITH PROPOFOL N/A 08/02/2015   Procedure: COLONOSCOPY WITH PROPOFOL w/ APC;  Surgeon: [name], MD;  Location: [facility];  Service: Endoscopy;  Laterality: N/A;  . ESOPHAGOGASTRODUODENOSCOPY (EGD) WITH PROPOFOL N/A 08/02/2015   Procedure: ESOPHAGOGASTRODUODENOSCOPY (EGD) WITH PROPOFOL/ possible dilation.;  Surgeon: [name], MD;  Location: [facility];  Service: Endoscopy;  Laterality: N/A;  . KNEE SURGERY     right knee orthoscopic  . PROSTATE BIOPSY  12/06/13   Gleason 4+3=7, volume 35 gm  . [place]  . TONSILLECTOMY      Family History  Problem Relation Age of Onset  . Heart disease Mother     before age 62  . Diabetes Mother   . Hyperlipidemia Mother   . Varicose Veins Mother   . Heart disease Father   . Diabetes Father   . Cancer Neg Hx     Social History:  reports that he quit smoking about 4 years ago. His smoking use included Cigarettes. He has a 11.50 pack-year smoking history. He has never used smokeless tobacco. He reports that he drinks alcohol. He reports that he does not use drugs.    Review of Systems        Lipids: he has had significant hyperlipidemia and has been treated with Lipitor 40 mg daily; no history of CAD       Lab Results  Component Value Date   CHOL 164 04/23/2016   HDL 32 (L) 04/23/2016   LDLCALC 97 04/23/2016   LDLDIRECT 103.0 02/08/2015   TRIG 175 (H) 04/23/2016   CHOLHDL 5.1 (H) 04/23/2016                   The blood pressure has been treated with losartan 100 mg and low-dose metoprolol, followed by PCP and cardiologist He was told to reduce his losartan last time but  he is still taking the whole tablet Using a wrist monitor at home No lightheadedness.  BP Readings from Last 3 Encounters:  09/02/16 106/80  07/22/16 (!) 170/89  07/08/16 128/68    May have occasional tingling or sharp pain in his lower legs or feet   LABS:  Lab on 08/29/2016  Component Date Value Ref Range Status  . Sodium 08/29/2016 140  135 - 145 mEq/L Final  . Potassium 08/29/2016 4.6  3.5 - 5.1 mEq/L Final  . Chloride 08/29/2016 103  96 - 112 mEq/L Final  . CO2 08/29/2016 29  19 - 32 mEq/L Final  . Glucose, Bld 08/29/2016 123* 70 - 99 mg/dL Final  . BUN 08/29/2016 26* 6 - 23 mg/dL Final  . Creatinine, Ser 08/29/2016 1.22  0.40 - 1.50 mg/dL Final  . Total Bilirubin 08/29/2016 0.5  0.2 - 1.2 mg/dL Final  . Alkaline Phosphatase 08/29/2016 71  39 - 117 U/L Final  . AST 08/29/2016 13  0 - 37 U/L Final  . ALT 08/29/2016 20  0 - 53 U/L Final  . Total Protein 08/29/2016 7.0  6.0 - 8.3 g/dL Final  . Albumin 08/29/2016 4.2  3.5 - 5.2 g/dL Final  . Calcium 08/29/2016 9.4  8.4 - 10.5 mg/dL Final  . GFR 08/29/2016 63.58  >  60.00 mL/min Final  . Microalb, Ur 08/29/2016 8.3* 0.0 - 1.9 mg/dL Final  . Creatinine,U 08/29/2016 106.4  mg/dL Final  . Microalb Creat Ratio 08/29/2016 7.8  0.0 - 30.0 mg/g Final  . Hgb A1c MFr Bld 08/29/2016 6.5  4.6 - 6.5 % Final    Physical Examination:  BP 106/80   Pulse 89   Ht 5' 10.47" (1.79 m)   Wt (!) 304 lb 9.6 oz (138.2 kg)   SpO2 94%   BMI 43.12 kg/m           Diabetic Foot Exam - Simple   Simple Foot Form Diabetic Foot exam was performed with the following findings:  Yes 09/02/2016  9:25 AM  Visual Inspection No deformities, no ulcerations, no other skin breakdown bilaterally:  Yes Sensation Testing Intact to touch and monofilament testing bilaterally:  Yes Pulse Check See comments:  Yes Comments Absent right dorsalis pedis and 1+ posterior tibialis.  Normal dorsalis pedis on the left     ASSESSMENT:   Diabetes type 2,  uncontrolled   See history of present illness for detailed discussion of his current management, blood sugar patterns and problems identified    Blood sugars are well controlled on a regimen of 9.2KM Trulicity along with glipizide ER 2.5 mg daily, Invokana 100 mg and metformin 2.0 g daily. His blood sugars have  been usually good by recall A1c 6.5 and fairly stable His main problem was continued weight gain This is despite using Trulicity and Invokana  HYPERTENSION: Blood pressure is low normal today but has not been consistently low  PLAN:   No change in medications for the diabetes  Encouraged him to be exercising as tolerated regularly  Improve diet again  Follow-up in 4 months   Patient Instructions  Check blood sugars on waking up  2-3x per week  Also check blood sugars about 2 hours after a meal and do this after different meals by rotation  Recommended blood sugar levels on waking up is 80-130 and about 2 hours after meal is 130-160  Please bring your blood sugar monitor to each visit, thank you    [facility] 09/02/2016, 9:20 AM   Note: This office note was prepared with Dragon voice recognition system technology. Any transcriptional errors that result from this process are unintentional.

## 2016-09-04 ENCOUNTER — Encounter: Payer: Self-pay | Admitting: Family Medicine

## 2016-09-04 ENCOUNTER — Ambulatory Visit (INDEPENDENT_AMBULATORY_CARE_PROVIDER_SITE_OTHER): Payer: BLUE CROSS/BLUE SHIELD | Admitting: Family Medicine

## 2016-09-04 VITALS — BP 131/74 | HR 65 | Temp 97.7°F | Resp 18 | Ht 70.0 in | Wt 306.0 lb

## 2016-09-04 DIAGNOSIS — Z6841 Body Mass Index (BMI) 40.0 and over, adult: Secondary | ICD-10-CM | POA: Diagnosis not present

## 2016-09-04 DIAGNOSIS — I480 Paroxysmal atrial fibrillation: Secondary | ICD-10-CM

## 2016-09-04 DIAGNOSIS — E785 Hyperlipidemia, unspecified: Secondary | ICD-10-CM | POA: Diagnosis not present

## 2016-09-04 DIAGNOSIS — E669 Obesity, unspecified: Secondary | ICD-10-CM

## 2016-09-04 DIAGNOSIS — E119 Type 2 diabetes mellitus without complications: Secondary | ICD-10-CM

## 2016-09-04 DIAGNOSIS — I82512 Chronic embolism and thrombosis of left femoral vein: Secondary | ICD-10-CM

## 2016-09-04 DIAGNOSIS — J449 Chronic obstructive pulmonary disease, unspecified: Secondary | ICD-10-CM

## 2016-09-04 DIAGNOSIS — IMO0001 Reserved for inherently not codable concepts without codable children: Secondary | ICD-10-CM

## 2016-09-04 NOTE — Patient Instructions (Addendum)
Spiriva is usually a once daily medication. You should have refills. If more short of breath, or worse symptoms - return for recheck.   I will schedule ultrasound of your leg. No change in Xarelto for now.   I would recommend calling Dr. Evette Georges office to schedule follow up appointment for your atrial fibrillation. If those symptoms occur more often you may need to be seen sooner. Continue metoprolol and Xarelto at same doses for now.   Avoid foods that can cause heartburn. No change in omeprazole for now.   Continue follow up as planned with urologist.   Good luck with the walking and exercise for weight loss. Let me know if I can help with nutritionist referral or other assistance needed. For any exercise more than walking, would discuss with cardiologist first.  Return to the clinic or go to the nearest emergency room if any of your symptoms worsen or new symptoms occur.    Food Choices for Gastroesophageal Reflux Disease, Adult When you have gastroesophageal reflux disease (GERD), the foods you eat and your eating habits are very important. Choosing the right foods can help ease the discomfort of GERD. What general guidelines do I need to follow?  Choose fruits, vegetables, whole grains, low-fat dairy products, and low-fat meat, fish, and poultry.  Limit fats such as oils, salad dressings, butter, nuts, and avocado.  Keep a food diary to identify foods that cause symptoms.  Avoid foods that cause reflux. These may be different for different people.  Eat frequent small meals instead of three large meals each day.  Eat your meals slowly, in a relaxed setting.  Limit fried foods.  Cook foods using methods other than frying.  Avoid drinking alcohol.  Avoid drinking large amounts of liquids with your meals.  Avoid bending over or lying down until 2-3 hours after eating. What foods are not recommended? The following are some foods and drinks that may worsen your  symptoms: Vegetables  Tomatoes. Tomato juice. Tomato and spaghetti sauce. Chili peppers. Onion and garlic. Horseradish. Fruits  Oranges, grapefruit, and lemon (fruit and juice). Meats  High-fat meats, fish, and poultry. This includes hot dogs, ribs, ham, sausage, salami, and bacon. Dairy  Whole milk and chocolate milk. Sour cream. Cream. Butter. Ice cream. Cream cheese. Beverages  Coffee and tea, with or without caffeine. Carbonated beverages or energy drinks. Condiments  Hot sauce. Barbecue sauce. Sweets/Desserts  Chocolate and cocoa. Donuts. Peppermint and spearmint. Fats and Oils  High-fat foods, including [place] fries and potato chips. Other  Vinegar. Strong spices, such as black pepper, white pepper, red pepper, cayenne, curry powder, cloves, ginger, and chili powder. The items listed above may not be a complete list of foods and beverages to avoid. Contact your dietitian for more information.  This information is not intended to replace advice given to you by your health care provider. Make sure you discuss any questions you have with your health care provider. Document Released: 08/18/2005 Document Revised: 01/24/2016 Document Reviewed: 06/22/2013 Elsevier Interactive Patient Education  2017 [employer].      IF you received an x-ray today, you will receive an invoice from [facility]. Please contact [facility] at [phone] with questions or concerns regarding your invoice.   IF you received labwork today, you will receive an invoice from [place] [facility]. Please contact [facility] at [phone] with questions or concerns regarding your invoice.   Our billing staff will not be able to assist you with questions regarding bills from these companies.  You will be contacted with the lab results as soon as they are available. The fastest way to get your results is to activate your My Chart account. Instructions are located on the last page of this paperwork. If you  have not heard from [name] regarding the results in 2 weeks, please contact this office.

## 2016-09-04 NOTE — Progress Notes (Addendum)
By signing my name below I, Tereasa Coop, attest that this documentation has been prepared under the direction and in the presence of Tommy Agreste, MD. Electonically Signed. Tereasa Coop, Scribe 09/04/2016 at 8:57 AM  Subjective:    Patient ID: Tommy Yang, male    DOB: 09/19/1950, 66 y.o.   MRN: [id number]  Chief Complaint  Patient presents with  . Follow-up    patient states he is here to get to know Dr. Carlota Raspberry so he knows about his medication refills/health issues/etc..  . Medication Refill    patient states he sent in a refill to his pharmacy already; he states he has about 1 month left of refills.    HPI Tommy Yang is a 66 y.o. male who presents to the [facility] in order to establish care with Dr Carlota Raspberry. Pt is a prior pt of Dr Everlene Farrier.  States that he is doing well today and has no complaints.   Type II DM Lab Results  Component Value Date   HGBA1C 6.5 08/29/2016   Lab Results  Component Value Date   MICROALBUR 8.3 (H) 08/29/2016   Followed by Dr Dwyane Dee; endocrinologist. Last office visit on 09/02/16. DM controlled with last A1C of 6.5. Given pneumovax and flu shot on 04/23/16. Does not take insulin for DM. Reports rare episodes of "electric shock" on his legs.    Prostate Cancer Followed by urology. Dx with cancer 2 years ago. Received radiation therapy for 8 weeks. Scheduled to see urologist in Feb 2018.   HLD Lab Results  Component Value Date   CHOL 164 04/23/2016   HDL 32 (L) 04/23/2016   LDLCALC 97 04/23/2016   LDLDIRECT 103.0 02/08/2015   TRIG 175 (H) 04/23/2016   CHOLHDL 5.1 (H) 04/23/2016   Lab Results  Component Value Date   ALT 20 08/29/2016   AST 13 08/29/2016   ALKPHOS 71 08/29/2016   BILITOT 0.5 08/29/2016  Pt on 47m lipitor QD. Denies any muscle aches. Followed by Dr KDwyane Dee endocrinologist.    HTN Lab Results  Component Value Date   CREATININE 1.22 08/29/2016   On 101mlosartan QD, 2.21m37morvasc, and 221m21metoprolol BID.   History of DVT  On chronic anti coagulation. Chronic thrombus in the left femoral and popliteal veins on 04/01/16 ultrasound. Takes 20mg10melto QD.   History of A-fib Pt reports one hospitalization due to A-fib. States that he has mild episodes of similar palpitations once a month. Pt last seen by his cardiologist, Dr KelleGeorgina Peerear ago. On metoprolol and xarelto, as above. Pt had negative stress test with Dr KelleGeorgina Peeryears ago.   Obesity Wt Readings from Last 3 Encounters:  09/04/16 (!) 306 lb (138.8 kg)  09/02/16 (!) 304 lb 9.6 oz (138.2 kg)  07/22/16 290 lb (131.5 kg)   Body mass index is 43.91 kg/m.  GERD On 40mg 51mrazole QD. Put on 40mg o48mazole BID by GI after endoscopy. Pt's prior PCP, Dr Daub reEverlene Farrierd intake to 40mg om40mzole QD. Pt states that GERD symptoms are well controlled.   COPD Mild. Treated by Dr Mannam wVaughan Browneririva. Pt states he has only used his Spiriva twice.   Pt has also been followed for a lung mass which was initially thought to be lung nodule, however CT scan showed to be a medial stinal lymph node. Was evaluated by Dr HendrickRoxan Hockey1/17. On subsequent CT scan, appears to be fluid density consistent with pericardial recess, which  is an anatomical variant, no need for intervention,.   Patient Active Problem List   Diagnosis Date Noted  . Microalbuminuria 04/23/2016  . PAF (paroxysmal atrial fibrillation) ([place]) 12/29/2015  . Rectal bleeding 07/18/2015  . Dysphagia 07/18/2015  . Chronic anticoagulation 03/14/2015  . Morbid obesity ([place]) 03/14/2015  . Exertional shortness of breath 01/12/2015  . Abnormal ECG 01/12/2015  . Encounter for screening colonoscopy 05/09/2014  . Esophageal reflux 05/09/2014  . Other dysphagia 05/09/2014  . Evaluate for OSA (obstructive sleep apnea) 02/28/2014  . Hyperlipidemia 02/28/2014  . Prostate cancer ([place]) 12/14/2013  . PSA elevation 10/26/2013  . Erectile dysfunction 10/26/2013  . Type II  diabetes mellitus, uncontrolled ([place]) 10/19/2013  . DVT (deep venous thrombosis) ([place])   . Left leg DVT ([place]) 09/28/2013  . Leg edema, left 09/28/2013   Past Medical History:  Diagnosis Date  . Diabetes mellitus without complication ([place])   . DVT (deep venous thrombosis) ([facility])    left leg.  [name] GERD (gastroesophageal reflux disease)   . Hypercholesterolemia   . Hypertension   . Prostate cancer ([place]) 12/06/13   gleason 4+3=7, 6/12 cores positive. seed implant and radiation   Past Surgical History:  Procedure Laterality Date  . APPENDECTOMY    . BALLOON DILATION N/A 08/02/2015   Procedure: BALLOON DILATION;  Surgeon: [name], MD;  Location: [facility];  Service: Endoscopy;  Laterality: N/A;  . COLONOSCOPY WITH PROPOFOL N/A 08/02/2015   Procedure: COLONOSCOPY WITH PROPOFOL w/ APC;  Surgeon: [name], MD;  Location: [facility];  Service: Endoscopy;  Laterality: N/A;  . ESOPHAGOGASTRODUODENOSCOPY (EGD) WITH PROPOFOL N/A 08/02/2015   Procedure: ESOPHAGOGASTRODUODENOSCOPY (EGD) WITH PROPOFOL/ possible dilation.;  Surgeon: [name], MD;  Location: [facility];  Service: Endoscopy;  Laterality: N/A;  . KNEE SURGERY     right knee orthoscopic  . PROSTATE BIOPSY  12/06/13   Gleason 4+3=7, volume 35 gm  . [place]  . TONSILLECTOMY     No Known Allergies Prior to Admission medications   Medication Sig Start Date End Date Taking? Authorizing Provider  amLODipine (NORVASC) 2.5 MG tablet Take 1 tablet (2.5 mg total) by mouth daily. 04/23/16  Yes Darlyne Russian, MD  atorvastatin (LIPITOR) 40 MG tablet TAKE ONE TABLET BY MOUTH ONCE DAILY. 04/23/16  Yes Darlyne Russian, MD  blood glucose meter kit and supplies KIT Test blood sugar daily as directed. Dx code: E11.9 01/23/15  Yes Darlyne Russian, MD  canagliflozin (INVOKANA) 100 MG TABS tablet TAKE 1 TABLET BEFORE BREAKFAST 09/02/16  Yes Elayne Snare, MD  CINNAMON PO Take 1,000 mg by mouth daily.    Yes Historical Provider, MD  glipiZIDE (GLUCOTROL XL) 2.5 MG 24 hr tablet TAKE ONE TABLET BY MOUTH ONCE DAILY WITH BREAKFAST 09/02/16  Yes Elayne Snare, MD  glucose blood test strip Check twice daily. DX: E11.65 One touch ultra Blue 02/05/16  Yes Elayne Snare, MD  losartan (COZAAR) 100 MG tablet TAKE ONE TABLET BY MOUTH ONCE DAILY 04/23/16  Yes Darlyne Russian, MD  metFORMIN (GLUCOPHAGE XR) 500 MG 24 hr tablet Take 2 tablets twice daily 09/02/16  Yes Elayne Snare, MD  metoprolol tartrate (LOPRESSOR) 25 MG tablet Take 1 tablet (25 mg total) by mouth 2 (two) times daily. 04/23/16  Yes Darlyne Russian, MD  Omega-3 Fatty Acids (FISH OIL) 1000 MG CAPS Take 1,000 mg by mouth 2 (two) times daily.   Yes Historical Provider, MD  omeprazole (PRILOSEC) 40 MG capsule One tablet daily 04/23/16  Yes Darlyne Russian, MD  rivaroxaban (XARELTO) 20 MG TABS tablet TAKE ONE TABLET BY MOUTH ONCE DAILY WITH SUPPER 04/23/16  Yes Darlyne Russian, MD  Tiotropium Bromide Monohydrate (SPIRIVA RESPIMAT) 1.25 MCG/ACT AERS Inhale 2 puffs into the lungs daily. 07/08/16  Yes Praveen Mannam, MD  TRULICITY 1.5 UX/3.2TF SOPN INJECT CONTENTS OF 1 PEN PER WEEK 09/02/16  Yes Elayne Snare, MD  Vitamin D, Cholecalciferol, 400 UNITS TABS Take 400 Units by mouth daily.   Yes Historical Provider, MD   Social History   Social History  . Marital status: Married    Spouse name: N/A  . Number of children: 1  . Years of education: N/A   Occupational History  .  Retired   Social History Main Topics  . Smoking status: Former Smoker    Packs/day: 0.50    Years: 23.00    Types: Cigarettes    Quit date: 08/01/2012  . Smokeless tobacco: Never Used  . Alcohol use 0.0 oz/week     Comment: very seldom, maybe 3x per year  . Drug use: No  . Sexual activity: Yes    Birth control/ protection: Abstinence   Other Topics Concern  . 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  Eyes: Negative for visual disturbance.  Respiratory: Negative for cough, chest tightness and shortness of breath.   Cardiovascular: Positive for palpitations (rare episodes). Negative for chest pain and leg swelling.  Gastrointestinal: Negative for abdominal pain and blood in stool.  Musculoskeletal: Negative for myalgias.  Neurological: Negative for dizziness, light-headedness and headaches.       Objective:   Physical Exam  Constitutional: He is oriented to person, place, and time. He appears well-developed and well-nourished.  HENT:  Head: Normocephalic and atraumatic.  Eyes: EOM are normal. Pupils are equal, round, and reactive to light.  Neck: No JVD present. Carotid bruit is not present.  Cardiovascular: Normal rate, regular rhythm and normal heart sounds.   No murmur heard. Pulmonary/Chest: Effort normal and breath sounds normal. He has no rales.  Musculoskeletal: He exhibits edema (trace, lower extremites bilat). He exhibits no tenderness.       Right lower leg: He exhibits no tenderness.       Left lower leg: He exhibits no tenderness.  Neurological: He is alert and oriented to person, place, and time.  Skin: Skin is warm and dry.  Psychiatric: He has a normal mood and affect.  Vitals reviewed.   Vitals:   09/04/16 0808  BP: 131/74  Pulse: 65  Resp: 18  Temp: 97.7 F (36.5 C)  TempSrc: Oral  SpO2: 95%  Weight: (!) 306 lb (138.8 kg)  Height: '5\' 10"'  (1.778 m)         Assessment & Plan:  KAVEH KISSINGER is a 66 y.o. male Chronic deep vein thrombosis (DVT) of femoral vein of left lower extremity ([place]) - Plan: US Venous Img Lower Unilateral Left  -Chronic DVT. Will check ultrasound for monitoring. Continue Xarelto same  dose.   PAF (  paroxysmal atrial fibrillation) (H[facility])  - Episodic symptoms, anticoagulated with Xarelto, continue metoprolol for rate control at same dose based on blood pressure readings. Advised to follow-up with cardiologist- especially if anticipated increased physical exertion with exercise.  Type 2 diabetes mellitus without complication, without long-term current use of insulin ([facility])  - Controlled, follow-up as planned with endocrinologist.  Hyperlipidemia Continue Lipitor same dose as tolerated.  Lipid panel and CMP appears to be ordered for next visit.  Chronic obstructive pulmonary disease, unspecified COPD type ([place])  -Discuss typical daily dosing of sprue, especially if any disturbing exertion with exercise. Has refills, but can send him further refills until next planned visit of August for physical.  Obesity  - Commended on plan to increase walking and exercise for weight loss. Has met with nutritionist previously, advised if further nutritionist eval needed, let me know and I'll be happy to place referral. Discussed advancing exercise regimen with cardiologist, but walking okay for now.  Continue routine follow-up with urologist with history of prostate cancer.  No orders of the defined types were placed in this encounter.  Patient Instructions   Spiriva is usually a once daily medication. You should have refills. If more short of breath, or worse symptoms - return for recheck.   I will schedule ultrasound of your leg. No change in Xarelto for now.   I would recommend calling Dr. Evette Georges office to schedule follow up appointment for your atrial fibrillation. If those symptoms occur more often you may need to be seen sooner. Continue metoprolol and Xarelto at same doses for now.   Avoid foods that can cause heartburn. No change in omeprazole for now.   Continue follow up as planned with urologist.   Good luck with the walking and exercise for weight loss. Let me know if I  can help with nutritionist referral or other assistance needed. For any exercise more than walking, would discuss with cardiologist first.  Return to the clinic or go to the nearest emergency room if any of your symptoms worsen or new symptoms occur.    Food Choices for Gastroesophageal Reflux Disease, Adult When you have gastroesophageal reflux disease (GERD), the foods you eat and your eating habits are very important. Choosing the right foods can help ease the discomfort of GERD. What general guidelines do I need to follow?  Choose fruits, vegetables, whole grains, low-fat dairy products, and low-fat meat, fish, and poultry.  Limit fats such as oils, salad dressings, butter, nuts, and avocado.  Keep a food diary to identify foods that cause symptoms.  Avoid foods that cause reflux. These may be different for different people.  Eat frequent small meals instead of three large meals each day.  Eat your meals slowly, in a relaxed setting.  Limit fried foods.  Cook foods using methods other than frying.  Avoid drinking alcohol.  Avoid drinking large amounts of liquids with your meals.  Avoid bending over or lying down until 2-3 hours after eating. What foods are not recommended? The following are some foods and drinks that may worsen your symptoms: Vegetables  Tomatoes. Tomato juice. Tomato and spaghetti sauce. Chili peppers. Onion and garlic. Horseradish. Fruits  Oranges, grapefruit, and lemon (fruit and juice). Meats  High-fat meats, fish, and poultry. This includes hot dogs, ribs, ham, sausage, salami, and bacon. Dairy  Whole milk and chocolate milk. Sour cream. Cream. Butter. Ice cream. Cream cheese. Beverages  Coffee and tea, with or without caffeine. Carbonated beverages or energy drinks. Condiments  Hot sauce. Barbecue sauce. Sweets/Desserts  Chocolate and cocoa. Donuts. Peppermint and spearmint. Fats and Oils  High-fat foods, including [place] fries and potato  chips. Other  Vinegar. Strong spices, such as black pepper, white pepper, red pepper, cayenne, curry powder, cloves, ginger, and chili powder. The items listed above may not be a complete list of foods and beverages to avoid. Contact your dietitian for more information.  This information is not intended to replace advice given to you by your health care provider. Make sure you discuss any questions you have with your health care provider. Document Released: 08/18/2005 Document Revised: 01/24/2016 Document Reviewed: 06/22/2013 Elsevier Interactive Patient Education  2017 [employer].      IF you received an x-ray today, you will receive an invoice from [facility]. Please contact [facility] at [phone] with questions or concerns regarding your invoice.   IF you received labwork today, you will receive an invoice from [facility]. Please contact [facility] at [phone] with questions or concerns regarding your invoice.   Our billing staff will not be able to assist you with questions regarding bills from these companies.  You will be contacted with the lab results as soon as they are available. The fastest way to get your results is to activate your My Chart account. Instructions are located on the last page of this paperwork. If you have not heard from [name] regarding the results in 2 weeks, please contact this office.        I personally performed the services described in this documentation, which was scribed in my presence. The recorded information has been reviewed and considered, and addended by me as needed.   Signed,   Merri Ray, MD [facility].  09/04/16 9:14 AM

## 2016-09-06 ENCOUNTER — Other Ambulatory Visit: Payer: Self-pay | Admitting: Endocrinology

## 2016-10-07 DIAGNOSIS — Z8546 Personal history of malignant neoplasm of prostate: Secondary | ICD-10-CM | POA: Diagnosis not present

## 2016-10-11 ENCOUNTER — Telehealth: Payer: Self-pay

## 2016-10-11 ENCOUNTER — Other Ambulatory Visit: Payer: Self-pay | Admitting: *Deleted

## 2016-10-11 DIAGNOSIS — I824Z2 Acute embolism and thrombosis of unspecified deep veins of left distal lower extremity: Secondary | ICD-10-CM

## 2016-10-11 MED ORDER — RIVAROXABAN 20 MG PO TABS: ORAL_TABLET | ORAL | 3 refills | Status: DC

## 2016-10-11 NOTE — Telephone Encounter (Signed)
I called patient and notified him that this was taken care of.

## 2016-10-11 NOTE — Telephone Encounter (Signed)
Pt established with Dr. Carlota Raspberry last month and he was supposed to fill all medications for him.  However when he went to get his xarelto filled it was denied.  He has to take this medication every day for his heart.  Can we please fill this asap?  [phone]

## 2016-10-13 DIAGNOSIS — Z8546 Personal history of malignant neoplasm of prostate: Secondary | ICD-10-CM | POA: Diagnosis not present

## 2016-10-13 DIAGNOSIS — N5201 Erectile dysfunction due to arterial insufficiency: Secondary | ICD-10-CM | POA: Diagnosis not present

## 2016-10-13 DIAGNOSIS — R3915 Urgency of urination: Secondary | ICD-10-CM | POA: Diagnosis not present

## 2016-12-02 ENCOUNTER — Telehealth: Payer: Self-pay

## 2016-12-02 NOTE — Telephone Encounter (Signed)
xarelto needs [place] [id number]

## 2016-12-04 NOTE — Telephone Encounter (Signed)
approved

## 2016-12-30 ENCOUNTER — Other Ambulatory Visit: Payer: Self-pay | Admitting: Endocrinology

## 2016-12-30 ENCOUNTER — Other Ambulatory Visit (INDEPENDENT_AMBULATORY_CARE_PROVIDER_SITE_OTHER): Payer: BLUE CROSS/BLUE SHIELD

## 2016-12-30 DIAGNOSIS — E119 Type 2 diabetes mellitus without complications: Secondary | ICD-10-CM | POA: Diagnosis not present

## 2016-12-30 LAB — COMPREHENSIVE METABOLIC PANEL
ALT: 16 U/L (ref 0–53)
AST: 13 U/L (ref 0–37)
Albumin: 4.1 g/dL (ref 3.5–5.2)
Alkaline Phosphatase: 74 U/L (ref 39–117)
BUN: 16 mg/dL (ref 6–23)
CO2: 30 mEq/L (ref 19–32)
Calcium: 9.4 mg/dL (ref 8.4–10.5)
Chloride: 104 mEq/L (ref 96–112)
Creatinine, Ser: 1.14 mg/dL (ref 0.40–1.50)
GFR: 68.68 mL/min (ref >60.00)
Glucose, Bld: 105 mg/dL — ABNORMAL HIGH (ref 70–99)
Potassium: 4.5 mEq/L (ref 3.5–5.1)
Sodium: 141 mEq/L (ref 135–145)
Total Bilirubin: 0.6 mg/dL (ref 0.2–1.2)
Total Protein: 7.1 g/dL (ref 6.0–8.3)

## 2016-12-30 LAB — HEMOGLOBIN A1C: Hgb A1c MFr Bld: 6.9 % — ABNORMAL HIGH (ref 4.6–6.5)

## 2016-12-30 LAB — LIPID PANEL
Cholesterol: 149 mg/dL (ref 0–200)
HDL: 29.7 mg/dL — ABNORMAL LOW (ref >39.00)
NonHDL: 118.82
Total CHOL/HDL Ratio: 5
Triglycerides: 204 mg/dL — ABNORMAL HIGH (ref 0.0–149.0)
VLDL: 40.8 mg/dL — ABNORMAL HIGH (ref 0.0–40.0)

## 2016-12-30 LAB — LDL CHOLESTEROL, DIRECT: Direct LDL: 93 mg/dL

## 2017-01-02 ENCOUNTER — Ambulatory Visit (INDEPENDENT_AMBULATORY_CARE_PROVIDER_SITE_OTHER): Payer: BLUE CROSS/BLUE SHIELD | Admitting: Endocrinology

## 2017-01-02 ENCOUNTER — Encounter: Payer: Self-pay | Admitting: Endocrinology

## 2017-01-02 VITALS — BP 128/72 | HR 61 | Ht 70.0 in | Wt 304.2 lb

## 2017-01-02 DIAGNOSIS — E1165 Type 2 diabetes mellitus with hyperglycemia: Secondary | ICD-10-CM | POA: Diagnosis not present

## 2017-01-02 MED ORDER — CANAGLIFLOZIN-METFORMIN HCL ER 150-1000 MG PO TB24
2.0000 | ORAL_TABLET | Freq: Every day | ORAL | 3 refills | Status: DC
Start: 2017-01-02 — End: 2017-12-14

## 2017-01-02 NOTE — Progress Notes (Signed)
Patient ID: Tommy Yang, male   DOB: Jun 24, 1951, 66 y.o.   MRN: [id number]           Reason for Appointment: Follow-up for Type 2 Diabetes   History of Present Illness:          Diagnosis: Type 2 diabetes mellitus, date of diagnosis: 05/2013       Past history:   He apparently had significant hyperglycemia in 2014 when seen in the urgent care center but did not establish with a PCP for control. He was started on treatment for his diabetes in 10/2013 and he was initially treated with metformin and Januvia A few months later he was also given glipizide ER to help his control when his A1c had gone up to 10.9 He says he was also seen by dietitian but no record available of this. Subsequently his blood sugar control had improved with A1c down to 6.8 in 9/15 On his consultation in 3/16 he had high blood sugars and weight gain; A1c was 9.1% with taking glipizide ER, Januvia and metformin.  Recent history:    His A1c has been gradually increasing from 6.1 last year to 6.9 now  Current blood sugar patterns and problems identified:  He has mildly increased blood sugars at times especially after breakfast  He says he had blood sugars around 200 when he had steroids 6 weeks ago for up to 3 weeks  Has only one blood sugar after supper of 165  His diet appears to be relatively in carbohydrate and unbalanced especially in the morning when he is eating more fruit and cereals, does not like vegetables  Still has difficulty losing weight  This is despite walking 2-3 months a day   Non-insulin hypoglycemic drugs the patient is taking are: Glipizide ER 2.5 mg in am, metformin 1 g twice a day, Trulicity 1.5 mg weekly, Invokana 100 mg daily  Side effects from medications have been: None Compliance with the medical regimen: Good  Hypoglycemia: None recently    Glucose monitoring:  done 1-2 times a day         Glucometer: One Touch.      Blood Glucose readings by monitor download    Mean  values apply above for all meters except median for One Touch  PRE-MEAL Fasting PCB  Dinner Bedtime Overall  Glucose range:  99-1 36  115-179   113-141 165    Mean/median:     131     Self-care: The diet that the patient has been following is: tries to limit  fats .     Meals: 3 meals per day. Breakfast is sometimes cereal, frequently havingFruits, sometimes 2 at time           Exercise: walking couple miles a day at the track   [place] visit, most recent:?  2015 .               Weight history:    Wt Readings from Last 3 Encounters:  01/02/17 (!) 304 lb 3.2 oz (138 kg)  09/04/16 (!) 306 lb (138.8 kg)  09/02/16 (!) 304 lb 9.6 oz (138.2 kg)    Glycemic control:   Lab Results  Component Value Date   HGBA1C 6.9 (H) 12/30/2016   HGBA1C 6.5 08/29/2016   HGBA1C 6.1 04/23/2016   Lab Results  Component Value Date   MICROALBUR 8.3 (H) 08/29/2016   LDLCALC 97 04/23/2016   CREATININE 1.14 12/30/2016    Lab on 12/30/2016  Component Date  Value Ref Range Status  . Hgb A1c MFr Bld 12/30/2016 6.9* 4.6 - 6.5 % Final  . Sodium 12/30/2016 141  135 - 145 mEq/L Final  . Potassium 12/30/2016 4.5  3.5 - 5.1 mEq/L Final  . Chloride 12/30/2016 104  96 - 112 mEq/L Final  . CO2 12/30/2016 30  19 - 32 mEq/L Final  . Glucose, Bld 12/30/2016 105* 70 - 99 mg/dL Final  . BUN 12/30/2016 16  6 - 23 mg/dL Final  . Creatinine, Ser 12/30/2016 1.14  0.40 - 1.50 mg/dL Final  . Total Bilirubin 12/30/2016 0.6  0.2 - 1.2 mg/dL Final  . Alkaline Phosphatase 12/30/2016 74  39 - 117 U/L Final  . AST 12/30/2016 13  0 - 37 U/L Final  . ALT 12/30/2016 16  0 - 53 U/L Final  . Total Protein 12/30/2016 7.1  6.0 - 8.3 g/dL Final  . Albumin 12/30/2016 4.1  3.5 - 5.2 g/dL Final  . Calcium 12/30/2016 9.4  8.4 - 10.5 mg/dL Final  . GFR 12/30/2016 68.68  >60.00 mL/min Final  . Cholesterol 12/30/2016 149  0 - 200 mg/dL Final  . Triglycerides 12/30/2016 204.0* 0.0 - 149.0 mg/dL Final  . HDL 12/30/2016 29.70* >39.00  mg/dL Final  . VLDL 12/30/2016 40.8* 0.0 - 40.0 mg/dL Final  . Total CHOL/HDL Ratio 12/30/2016 5   Final  . NonHDL 12/30/2016 118.82   Final  . Direct LDL 12/30/2016 93.0  mg/dL Final      Allergies as of 01/02/2017   No Known Allergies     Medication List       Accurate as of 01/02/17  8:37 AM. Always use your most recent med list.          amLODipine 2.5 MG tablet Commonly known as:  NORVASC Take 1 tablet (2.5 mg total) by mouth daily.   atorvastatin 40 MG tablet Commonly known as:  LIPITOR TAKE ONE TABLET BY MOUTH ONCE DAILY.   blood glucose meter kit and supplies Kit Test blood sugar daily as directed. Dx code: E11.9   canagliflozin 100 MG Tabs tablet Commonly known as:  INVOKANA TAKE 1 TABLET BEFORE BREAKFAST   Canagliflozin-Metformin HCl ER [phone] MG Tb24 Commonly known as:  INVOKAMET XR Take 2 tablets by mouth daily with breakfast.   CINNAMON PO Take 1,000 mg by mouth daily.   Fish Oil 1000 MG Caps Take 1,000 mg by mouth 2 (two) times daily.   glipiZIDE 2.5 MG 24 hr tablet Commonly known as:  GLUCOTROL XL TAKE ONE TABLET BY MOUTH ONCE DAILY WITH BREAKFAST   losartan 100 MG tablet Commonly known as:  COZAAR TAKE ONE TABLET BY MOUTH ONCE DAILY   metFORMIN 500 MG 24 hr tablet Commonly known as:  GLUCOPHAGE XR Take 2 tablets twice daily   metoprolol tartrate 25 MG tablet Commonly known as:  LOPRESSOR Take 1 tablet (25 mg total) by mouth 2 (two) times daily.   omeprazole 40 MG capsule Commonly known as:  PRILOSEC One tablet daily   ONE TOUCH ULTRA TEST test strip Generic drug:  glucose blood CHECK TWICE A DAY   rivaroxaban 20 MG Tabs tablet Commonly known as:  XARELTO TAKE ONE TABLET BY MOUTH ONCE DAILY WITH SUPPER   TRULICITY 1.5 LZ/7.6BH Sopn Generic drug:  Dulaglutide INJECT CONTENTS OF 1 PEN PER WEEK   Vitamin D (Cholecalciferol) 400 units Tabs Take 400 Units by mouth daily.       Allergies: No Known Allergies  Past Medical  History:  Diagnosis Date  . Diabetes mellitus without complication ([place])   . DVT (deep venous thrombosis) (HCC)    left leg.  [name] GERD (gastroesophageal reflux disease)   . Hypercholesterolemia   . Hypertension   . Prostate cancer ([place]) 12/06/13   gleason 4+3=7, 6/12 cores positive. seed implant and radiation    Past Surgical History:  Procedure Laterality Date  . APPENDECTOMY    . BALLOON DILATION N/A 08/02/2015   Procedure: BALLOON DILATION;  Surgeon: [name], MD;  Location: [facility];  Service: Endoscopy;  Laterality: N/A;  . COLONOSCOPY WITH PROPOFOL N/A 08/02/2015   Procedure: COLONOSCOPY WITH PROPOFOL w/ APC;  Surgeon: [name], MD;  Location: [facility];  Service: Endoscopy;  Laterality: N/A;  . ESOPHAGOGASTRODUODENOSCOPY (EGD) WITH PROPOFOL N/A 08/02/2015   Procedure: ESOPHAGOGASTRODUODENOSCOPY (EGD) WITH PROPOFOL/ possible dilation.;  Surgeon: [name], MD;  Location: [facility];  Service: Endoscopy;  Laterality: N/A;  . KNEE SURGERY     right knee orthoscopic  . PROSTATE BIOPSY  12/06/13   Gleason 4+3=7, volume 35 gm  . [place]  . TONSILLECTOMY      Family History  Problem Relation Age of Onset  . Heart disease Mother     before age 44  . Diabetes Mother   . Hyperlipidemia Mother   . Varicose Veins Mother   . Heart disease Father   . Diabetes Father   . Cancer Neg Hx     Social History:  reports that he quit smoking about 4 years ago. His smoking use included Cigarettes. He has a 11.50 pack-year smoking history. He has never used smokeless tobacco. He reports that he drinks alcohol. He reports that he does not use drugs.    Review of Systems        Lipids: he has had significant hyperlipidemia and has been treated with Lipitor 40 mg daily; no history of CAD Continues to have low HDL      Lab Results  Component Value Date   CHOL 149 12/30/2016   HDL 29.70 (L) 12/30/2016   LDLCALC 97  04/23/2016   LDLDIRECT 93.0 12/30/2016   TRIG 204.0 (H) 12/30/2016   CHOLHDL 5 12/30/2016                   The blood pressure has been treated with losartan 100 mg and low-dose metoprolol, followed by PCP and cardiologist   BP Readings from Last 3 Encounters:  01/02/17 128/72  09/04/16 131/74  09/02/16 106/80      LABS:  Lab on 12/30/2016  Component Date Value Ref Range Status  . Hgb A1c MFr Bld 12/30/2016 6.9* 4.6 - 6.5 % Final  . Sodium 12/30/2016 141  135 - 145 mEq/L Final  . Potassium 12/30/2016 4.5  3.5 - 5.1 mEq/L Final  . Chloride 12/30/2016 104  96 - 112 mEq/L Final  . CO2 12/30/2016 30  19 - 32 mEq/L Final  . Glucose, Bld 12/30/2016 105* 70 - 99 mg/dL Final  . BUN 12/30/2016 16  6 - 23 mg/dL Final  . Creatinine, Ser 12/30/2016 1.14  0.40 - 1.50 mg/dL Final  . Total Bilirubin 12/30/2016 0.6  0.2 - 1.2 mg/dL Final  . Alkaline Phosphatase 12/30/2016 74  39 - 117 U/L Final  . AST 12/30/2016 13  0 - 37 U/L Final  . ALT 12/30/2016 16  0 - 53 U/L Final  . Total Protein 12/30/2016 7.1  6.0 - 8.3 g/dL Final  . Albumin 12/30/2016 4.1  3.5 - 5.2 g/dL Final  . Calcium 12/30/2016 9.4  8.4 - 10.5 mg/dL Final  . GFR 12/30/2016 68.68  >60.00 mL/min Final  . Cholesterol 12/30/2016 149  0 - 200 mg/dL Final  . Triglycerides 12/30/2016 204.0* 0.0 - 149.0 mg/dL Final  . HDL 12/30/2016 29.70* >39.00 mg/dL Final  . VLDL 12/30/2016 40.8* 0.0 - 40.0 mg/dL Final  . Total CHOL/HDL Ratio 12/30/2016 5   Final  . NonHDL 12/30/2016 118.82   Final  . Direct LDL 12/30/2016 93.0  mg/dL Final    Physical Examination:  BP 128/72 (Cuff Size: Large)   Pulse 61   Ht _0  (1.778 m)   Wt (!) 304 lb 3.2 oz (138 kg)   SpO2 94%   BMI 43.65 kg/m          No ankle edema present   ASSESSMENT:   Diabetes type 2, uncontrolled   See history of present illness for detailed discussion of his current management, blood sugar patterns and problems identified    Blood sugars are not quite as well  controlled with A1c going up to 6.9 Also has difficulty losing weight This is despite trying to walk regularly He has questions about his diet and does not appear to be getting balanced meals Also minimal monitoring after evening meal which she needs to do  Currently on a regimen of 1.5 mg Trulicity along with glipizide ER 2.5 mg daily, Invokana 100 mg and metformin 2.0 g daily.   HYPERTENSION: Blood pressure is controlled   PLAN:   Increase Invokana for maximum benefit, he will take 2 tablets daily and when he finishes his supply he will switch to Invokamet 150/1000, 2 tablets daily  No change in antihypertensives as yet, and will also be following up with his cardiologist  Consultation with dietitian for review of meal planning which he has not done in a while and needs more information specially with need for weight loss as well as improved dyslipidemia  Follow-up in 3 months with another A1c   Patient Instructions  Check blood sugars on waking up  2-3x weekly  Also check blood sugars about 2 hours after a meal and do this after different meals by rotation  Recommended blood sugar levels on waking up is 90-130 and about 2 hours after meal is 130-160  Please bring your blood sugar monitor to each visit, thank you  Invokana 2 daily  Next Rx will combine Metformin also, 2 in am     [facility] 01/02/2017, 8:37 AM   Note: This office note was prepared with Dragon voice recognition system technology. Any transcriptional errors that result from this process are unintentional.

## 2017-01-02 NOTE — Patient Instructions (Addendum)
Check blood sugars on waking up  2-3x weekly  Also check blood sugars about 2 hours after a meal and do this after different meals by rotation  Recommended blood sugar levels on waking up is 90-130 and about 2 hours after meal is 130-160  Please bring your blood sugar monitor to each visit, thank you  Invokana 2 daily  Next Rx will combine Metformin also, 2 in am

## 2017-02-13 DIAGNOSIS — H1851 Endothelial corneal dystrophy: Secondary | ICD-10-CM | POA: Diagnosis not present

## 2017-02-13 DIAGNOSIS — E119 Type 2 diabetes mellitus without complications: Secondary | ICD-10-CM | POA: Diagnosis not present

## 2017-02-13 DIAGNOSIS — H43812 Vitreous degeneration, left eye: Secondary | ICD-10-CM | POA: Diagnosis not present

## 2017-02-13 DIAGNOSIS — H25813 Combined forms of age-related cataract, bilateral: Secondary | ICD-10-CM | POA: Diagnosis not present

## 2017-02-27 ENCOUNTER — Other Ambulatory Visit: Payer: Self-pay | Admitting: Endocrinology

## 2017-03-05 ENCOUNTER — Other Ambulatory Visit: Payer: Self-pay | Admitting: Endocrinology

## 2017-03-10 DIAGNOSIS — H2511 Age-related nuclear cataract, right eye: Secondary | ICD-10-CM | POA: Diagnosis not present

## 2017-03-31 ENCOUNTER — Other Ambulatory Visit (INDEPENDENT_AMBULATORY_CARE_PROVIDER_SITE_OTHER): Payer: BLUE CROSS/BLUE SHIELD

## 2017-03-31 DIAGNOSIS — E1165 Type 2 diabetes mellitus with hyperglycemia: Secondary | ICD-10-CM

## 2017-03-31 LAB — BASIC METABOLIC PANEL
BUN: 24 mg/dL — ABNORMAL HIGH (ref 6–23)
CO2: 26 mEq/L (ref 19–32)
Calcium: 9.6 mg/dL (ref 8.4–10.5)
Chloride: 104 mEq/L (ref 96–112)
Creatinine, Ser: 1.28 mg/dL (ref 0.40–1.50)
GFR: 60.04 mL/min (ref >60.00)
Glucose, Bld: 131 mg/dL — ABNORMAL HIGH (ref 70–99)
Potassium: 4.8 mEq/L (ref 3.5–5.1)
Sodium: 137 mEq/L (ref 135–145)

## 2017-03-31 LAB — HEMOGLOBIN A1C: Hgb A1c MFr Bld: 6.9 % — ABNORMAL HIGH (ref 4.6–6.5)

## 2017-04-02 NOTE — Progress Notes (Signed)
Patient ID: Tommy Yang, male   DOB: [birth year], 66 y.o.   MRN: [id number]           Reason for Appointment: Follow-up for Type 2 Diabetes   History of Present Illness:          Diagnosis: Type 2 diabetes mellitus, date of diagnosis: 05/2013       Past history:   He apparently had significant hyperglycemia in 2014 when seen in the urgent care center but did not establish with a PCP for control. He was started on treatment for his diabetes in 10/2013 and he was initially treated with metformin and Januvia A few months later he was also given glipizide ER to help his control when his A1c had gone up to 10.9 He says he was also seen by dietitian but no record available of this. Subsequently his blood sugar control had improved with A1c down to 6.8 in 9/15 On his consultation in 3/16 he had high blood sugars and weight gain; A1c was 9.1% with taking glipizide ER, Januvia and metformin.  Recent history:    Non-insulin hypoglycemic drugs the patient is taking are: Glipizide ER 2.5 mg in am, Invokamet 150/1000, 2 tablets daily, Trulicity 1.5 mg weekly  His A1c has been as low as 6.1 but now again the same at 6.9  Current blood sugar patterns and problems identified:  He still has difficulty losing weight and this is about the same  He now is not able to walk as much because of his dyspnea  Again he tends to have relatively high fasting blood sugars  Has only sporadically higher readings after meals  He is able to check blood sugars fairly consistently after meals and is trying to cut back on hyperglycemic index foods like certain pastas  Does not think his portion sizes are larger  Side effects from medications have been: None Compliance with the medical regimen: Good   Hypoglycemia: None    Glucose monitoring:  done 1-2 times a day         Glucometer: One Touch.      Blood Glucose readings by monitor download   Mean values apply above for all meters except median for One  Touch  PRE-MEAL Fasting Lunch Dinner Bedtime Overall  Glucose range: 109-139     88  Mean/median: 129 122    126   POST-MEAL PC Breakfast PC Lunch PC Dinner  Glucose range:   111-205   Mean/median: 118  129  139      Self-care: The diet that the patient has been following is: tries to limit  fats .     Meals: 3 meals per day. Breakfast is sometimes cereal, frequently havingFruits, sometimes 2 at time           Exercise: walking less couple miles a day at the track   [place] visit, most recent:?  2015 .               Weight history:    Wt Readings from Last 3 Encounters:  04/03/17 (!) 306 lb (138.8 kg)  01/02/17 (!) 304 lb 3.2 oz (138 kg)  09/04/16 (!) 306 lb (138.8 kg)    Glycemic control:   Lab Results  Component Value Date   HGBA1C 6.9 (H) 03/31/2017   HGBA1C 6.9 (H) 12/30/2016   HGBA1C 6.5 08/29/2016   Lab Results  Component Value Date   MICROALBUR 8.3 (H) 08/29/2016   LDLCALC 97 04/23/2016   CREATININE 1.28 03/31/2017  Lab on 03/31/2017  Component Date Value Ref Range Status  . Hgb A1c MFr Bld 03/31/2017 6.9* 4.6 - 6.5 % Final   Glycemic Control Guidelines for People with Diabetes:Non Diabetic:  <6%Goal of Therapy: <7%Additional Action Suggested:  >8%   . Sodium 03/31/2017 137  135 - 145 mEq/L Final  . Potassium 03/31/2017 4.8  3.5 - 5.1 mEq/L Final  . Chloride 03/31/2017 104  96 - 112 mEq/L Final  . CO2 03/31/2017 26  19 - 32 mEq/L Final  . Glucose, Bld 03/31/2017 131* 70 - 99 mg/dL Final  . BUN 03/31/2017 24* 6 - 23 mg/dL Final  . Creatinine, Ser 03/31/2017 1.28  0.40 - 1.50 mg/dL Final  . Calcium 03/31/2017 9.6  8.4 - 10.5 mg/dL Final  . GFR 03/31/2017 60.04  >60.00 mL/min Final      Allergies as of 04/03/2017   No Known Allergies     Medication List       Accurate as of 04/03/17  4:26 PM. Always use your most recent med list.          amLODipine 2.5 MG tablet Commonly known as:  NORVASC Take 1 tablet (2.5 mg total) by mouth daily.     atorvastatin 40 MG tablet Commonly known as:  LIPITOR TAKE ONE TABLET BY MOUTH ONCE DAILY.   blood glucose meter kit and supplies Kit Test blood sugar daily as directed. Dx code: E11.9   canagliflozin 100 MG Tabs tablet Commonly known as:  INVOKANA TAKE 1 TABLET BEFORE BREAKFAST   Canagliflozin-Metformin HCl ER [phone] MG Tb24 Commonly known as:  INVOKAMET XR Take 2 tablets by mouth daily with breakfast.   CINNAMON PO Take 1,000 mg by mouth daily.   Fish Oil 1000 MG Caps Take 1,000 mg by mouth 2 (two) times daily.   glipiZIDE 2.5 MG 24 hr tablet Commonly known as:  GLUCOTROL XL TAKE 1 TABLET BY MOUTH ONCE DAILY WITH BREAKFAST   Icosapent Ethyl 1 g Caps Commonly known as:  VASCEPA Take 1 cap bid   losartan 100 MG tablet Commonly known as:  COZAAR TAKE ONE TABLET BY MOUTH ONCE DAILY   metoprolol tartrate 25 MG tablet Commonly known as:  LOPRESSOR Take 1 tablet (25 mg total) by mouth 2 (two) times daily.   omeprazole 40 MG capsule Commonly known as:  PRILOSEC One tablet daily   ONE TOUCH ULTRA TEST test strip Generic drug:  glucose blood CHECK TWICE A DAY   rivaroxaban 20 MG Tabs tablet Commonly known as:  XARELTO TAKE ONE TABLET BY MOUTH ONCE DAILY WITH SUPPER   TRULICITY 1.5 AJ/2.8NO Sopn Generic drug:  Dulaglutide INJECT CONTENTS OF 1 PEN PER WEEK   Vitamin D (Cholecalciferol) 400 units Tabs Take 400 Units by mouth daily.       Allergies: No Known Allergies  Past Medical History:  Diagnosis Date  . Diabetes mellitus without complication ([place])   . DVT (deep venous thrombosis) ([facility])    left leg.  [name] GERD (gastroesophageal reflux disease)   . Hypercholesterolemia   . Hypertension   . Prostate cancer ([place]) 12/06/13   gleason 4+3=7, 6/12 cores positive. seed implant and radiation    Past Surgical History:  Procedure Laterality Date  . APPENDECTOMY    . BALLOON DILATION N/A 08/02/2015   Procedure: BALLOON DILATION;  Surgeon: [name], MD;   Location: [facility];  Service: Endoscopy;  Laterality: N/A;  . COLONOSCOPY WITH PROPOFOL N/A 08/02/2015   Procedure: COLONOSCOPY WITH PROPOFOL w/  APC;  Surgeon: [name], MD;  Location: [facility];  Service: Endoscopy;  Laterality: N/A;  . ESOPHAGOGASTRODUODENOSCOPY (EGD) WITH PROPOFOL N/A 08/02/2015   Procedure: ESOPHAGOGASTRODUODENOSCOPY (EGD) WITH PROPOFOL/ possible dilation.;  Surgeon: [name], MD;  Location: [facility];  Service: Endoscopy;  Laterality: N/A;  . KNEE SURGERY     right knee orthoscopic  . PROSTATE BIOPSY  12/06/13   Gleason 4+3=7, volume 35 gm  . [place]  . TONSILLECTOMY      Family History  Problem Relation Age of Onset  . Heart disease Mother        before age 73  . Diabetes Mother   . Hyperlipidemia Mother   . Varicose Veins Mother   . Heart disease Father   . Diabetes Father   . Cancer Neg Hx     Social History:  reports that he quit smoking about 4 years ago. His smoking use included Cigarettes. He has a 11.50 pack-year smoking history. He has never used smokeless tobacco. He reports that he drinks alcohol. He reports that he does not use drugs.    Review of Systems        Lipids: he has had significant hyperlipidemia and has been treated with Lipitor 40 mg daily; no history of CAD HDL and triglycerides still not optimal      Lab Results  Component Value Date   CHOL 149 12/30/2016   HDL 29.70 (L) 12/30/2016   LDLCALC 97 04/23/2016   LDLDIRECT 93.0 12/30/2016   TRIG 204.0 (H) 12/30/2016   CHOLHDL 5 12/30/2016                   The blood pressure has been treated with losartan 100 mg and low-dose metoprolol, followed by PCP and cardiologist No lightheadedness  BP Readings from Last 3 Encounters:  04/03/17 118/66  01/02/17 128/72  09/04/16 131/74      LABS:  Lab on 03/31/2017  Component Date Value Ref Range Status  . Hgb A1c MFr Bld 03/31/2017 6.9* 4.6 - 6.5 % Final    Glycemic Control Guidelines for People with Diabetes:Non Diabetic:  <6%Goal of Therapy: <7%Additional Action Suggested:  >8%   . Sodium 03/31/2017 137  135 - 145 mEq/L Final  . Potassium 03/31/2017 4.8  3.5 - 5.1 mEq/L Final  . Chloride 03/31/2017 104  96 - 112 mEq/L Final  . CO2 03/31/2017 26  19 - 32 mEq/L Final  . Glucose, Bld 03/31/2017 131* 70 - 99 mg/dL Final  . BUN 03/31/2017 24* 6 - 23 mg/dL Final  . Creatinine, Ser 03/31/2017 1.28  0.40 - 1.50 mg/dL Final  . Calcium 03/31/2017 9.6  8.4 - 10.5 mg/dL Final  . GFR 03/31/2017 60.04  >60.00 mL/min Final    Physical Examination:  BP 118/66   Pulse 68   Ht '5\' 10"'  (1.778 m)   Wt (!) 306 lb (138.8 kg)   SpO2 96%   BMI 43.91 kg/m          No ankle edema present   ASSESSMENT:   Diabetes type 2, uncontrolled   See history of present illness for detailed discussion of his current management, blood sugar patterns and problems identified    Blood sugars are reasonably well controlled with A1c below 7% Although his blood sugars are fairly good at home throughout the day he tends to have relatively higher fasting readings now He is on maximum doses of Invega med  and Trulicity Ozempic is not covered by his insurance currently His main difficulty is with his obesity and is not able to exercise much lately   HYPERTENSION: Blood pressure is controlled  LIPIDS: He still has relatively high triglycerides, currently only taking 1 g of OTC fish oil    PLAN:   No change in medication regimen  Continue monitoring blood sugars by rotation at various times  Increase exercise if able to  Continue follow-up with cardiologist and PCP for blood pressure medications  Trial of VASCEPA 1 Twice a day instead of OTC fish oil  Patient Instructions  Check blood sugars on waking up 2-3x   Also check blood sugars about 2 hours after a meal and do this after different meals by rotation  Recommended blood sugar levels on waking up is 90-130  and about 2 hours after meal is 130-160  Please bring your blood sugar monitor to each visit, thank you    [facility] 04/03/2017, 4:26 PM   Note: This office note was prepared with Dragon voice recognition system technology. Any transcriptional errors that result from this process are unintentional.

## 2017-04-03 ENCOUNTER — Encounter: Payer: Self-pay | Admitting: Endocrinology

## 2017-04-03 ENCOUNTER — Ambulatory Visit (INDEPENDENT_AMBULATORY_CARE_PROVIDER_SITE_OTHER): Payer: BLUE CROSS/BLUE SHIELD | Admitting: Endocrinology

## 2017-04-03 VITALS — BP 118/66 | HR 68 | Ht 70.0 in | Wt 306.0 lb

## 2017-04-03 DIAGNOSIS — E782 Mixed hyperlipidemia: Secondary | ICD-10-CM

## 2017-04-03 DIAGNOSIS — E1165 Type 2 diabetes mellitus with hyperglycemia: Secondary | ICD-10-CM

## 2017-04-03 MED ORDER — ICOSAPENT ETHYL 1 G PO CAPS: ORAL_CAPSULE | ORAL | 3 refills | Status: DC

## 2017-04-03 NOTE — Patient Instructions (Signed)
Check blood sugars on waking up 2-3x   Also check blood sugars about 2 hours after a meal and do this after different meals by rotation  Recommended blood sugar levels on waking up is 90-130 and about 2 hours after meal is 130-160  Please bring your blood sugar monitor to each visit, thank you

## 2017-04-12 ENCOUNTER — Other Ambulatory Visit: Payer: Self-pay | Admitting: Endocrinology

## 2017-04-14 ENCOUNTER — Other Ambulatory Visit: Payer: Self-pay

## 2017-04-14 ENCOUNTER — Telehealth: Payer: Self-pay

## 2017-04-14 MED ORDER — ICOSAPENT ETHYL 1 G PO CAPS: ORAL_CAPSULE | ORAL | 3 refills | Status: DC

## 2017-04-14 NOTE — Telephone Encounter (Signed)
Spoke with rep from [facility] and answered the clinical questions and he was approved for Vascepa 1g capsules- approved from 03/15/17 to 04/13/20 and patient is aware

## 2017-04-23 ENCOUNTER — Other Ambulatory Visit: Payer: Self-pay | Admitting: Emergency Medicine

## 2017-04-28 DIAGNOSIS — Z8546 Personal history of malignant neoplasm of prostate: Secondary | ICD-10-CM | POA: Diagnosis not present

## 2017-05-01 ENCOUNTER — Other Ambulatory Visit: Payer: Self-pay | Admitting: Endocrinology

## 2017-05-01 ENCOUNTER — Other Ambulatory Visit: Payer: Self-pay | Admitting: Emergency Medicine

## 2017-05-01 ENCOUNTER — Telehealth: Payer: Self-pay | Admitting: Family Medicine

## 2017-05-01 NOTE — Telephone Encounter (Signed)
Pt is needing a refill on his amlodipine   Best number [phone]  [facility] [place]

## 2017-05-03 ENCOUNTER — Other Ambulatory Visit: Payer: Self-pay | Admitting: Emergency Medicine

## 2017-05-05 ENCOUNTER — Other Ambulatory Visit: Payer: Self-pay

## 2017-05-05 DIAGNOSIS — Z8546 Personal history of malignant neoplasm of prostate: Secondary | ICD-10-CM | POA: Diagnosis not present

## 2017-05-05 MED ORDER — RIVAROXABAN 20 MG PO TABS: ORAL_TABLET | ORAL | 0 refills | Status: DC

## 2017-05-05 MED ORDER — AMLODIPINE BESYLATE 2.5 MG PO TABS: 2.5000 mg | ORAL_TABLET | Freq: Every day | ORAL | 0 refills | Status: DC

## 2017-05-05 NOTE — Telephone Encounter (Signed)
1 week of medication sent. Pt has an apt on 05/12/17, enough medication was sent to last him until apt.

## 2017-05-05 NOTE — Telephone Encounter (Signed)
Enough medication sent to [facility] on [address] to last until his apt on 05/12/17.

## 2017-05-06 ENCOUNTER — Telehealth: Payer: Self-pay | Admitting: Family Medicine

## 2017-05-06 NOTE — Telephone Encounter (Signed)
[place] Is calling to get clarification on [facility] and metoprolol   Best number [phone]

## 2017-05-08 ENCOUNTER — Telehealth: Payer: Self-pay | Admitting: *Deleted

## 2017-05-08 NOTE — Telephone Encounter (Signed)
Spoke with patient, he has an appt schedule on 05/12/17 called to [place], [phone], spoke with Inez Catalina, ok'd Omeprazole (Prilosec) 40 mg capsule #30,0 rx.[name]

## 2017-05-12 ENCOUNTER — Telehealth: Payer: Self-pay

## 2017-05-12 ENCOUNTER — Encounter: Payer: Self-pay | Admitting: Family Medicine

## 2017-05-12 ENCOUNTER — Ambulatory Visit (INDEPENDENT_AMBULATORY_CARE_PROVIDER_SITE_OTHER): Payer: BLUE CROSS/BLUE SHIELD | Admitting: Family Medicine

## 2017-05-12 VITALS — BP 154/80 | HR 76 | Temp 97.4°F | Resp 17 | Ht 70.0 in | Wt 304.0 lb

## 2017-05-12 DIAGNOSIS — I825Z9 Chronic embolism and thrombosis of unspecified deep veins of unspecified distal lower extremity: Secondary | ICD-10-CM

## 2017-05-12 DIAGNOSIS — I4891 Unspecified atrial fibrillation: Secondary | ICD-10-CM

## 2017-05-12 DIAGNOSIS — Z1321 Encounter for screening for nutritional disorder: Secondary | ICD-10-CM | POA: Diagnosis not present

## 2017-05-12 DIAGNOSIS — Z6841 Body Mass Index (BMI) 40.0 and over, adult: Secondary | ICD-10-CM | POA: Diagnosis not present

## 2017-05-12 DIAGNOSIS — Z23 Encounter for immunization: Secondary | ICD-10-CM | POA: Diagnosis not present

## 2017-05-12 DIAGNOSIS — K219 Gastro-esophageal reflux disease without esophagitis: Secondary | ICD-10-CM | POA: Diagnosis not present

## 2017-05-12 DIAGNOSIS — E785 Hyperlipidemia, unspecified: Secondary | ICD-10-CM | POA: Diagnosis not present

## 2017-05-12 DIAGNOSIS — E669 Obesity, unspecified: Secondary | ICD-10-CM | POA: Diagnosis not present

## 2017-05-12 DIAGNOSIS — Z0000 Encounter for general adult medical examination without abnormal findings: Secondary | ICD-10-CM

## 2017-05-12 DIAGNOSIS — I10 Essential (primary) hypertension: Secondary | ICD-10-CM | POA: Diagnosis not present

## 2017-05-12 DIAGNOSIS — IMO0001 Reserved for inherently not codable concepts without codable children: Secondary | ICD-10-CM

## 2017-05-12 MED ORDER — LOSARTAN POTASSIUM 100 MG PO TABS: 100.0000 mg | ORAL_TABLET | Freq: Every day | ORAL | 1 refills | Status: DC

## 2017-05-12 MED ORDER — ZOSTER VAC RECOMB ADJUVANTED 50 MCG/0.5ML IM SUSR: 0.5000 mL | Freq: Once | INTRAMUSCULAR | 1 refills | Status: AC

## 2017-05-12 MED ORDER — ATORVASTATIN CALCIUM 40 MG PO TABS: ORAL_TABLET | ORAL | 1 refills | Status: DC

## 2017-05-12 MED ORDER — RIVAROXABAN 20 MG PO TABS: ORAL_TABLET | ORAL | 1 refills | Status: DC

## 2017-05-12 MED ORDER — OMEPRAZOLE 40 MG PO CPDR: DELAYED_RELEASE_CAPSULE | ORAL | 3 refills | Status: DC

## 2017-05-12 MED ORDER — AMLODIPINE BESYLATE 2.5 MG PO TABS: 2.5000 mg | ORAL_TABLET | Freq: Every day | ORAL | 1 refills | Status: DC

## 2017-05-12 MED ORDER — METOPROLOL TARTRATE 25 MG PO TABS: 25.0000 mg | ORAL_TABLET | Freq: Two times a day (BID) | ORAL | 1 refills | Status: DC

## 2017-05-12 NOTE — Telephone Encounter (Signed)
Rx for shingles at 102

## 2017-05-12 NOTE — Patient Instructions (Addendum)
Call Dr. Claiborne Billings to schedule office visit.  Low intensity exercise until discussed with cardiologist.  I will refer you to bariatric surgeon to discuss weight loss options.  I will check vitamin B12, magnesium, vitamin D levels due to daily use of omeprazole. If you are able to decrease use of that medication to every other day, there is less chance of nutritional deficiencies. Monitor your blood pressure once you are back on losartan, and if remaining over 130/80, return to discuss changes.  Recheck in 6 months.  Keeping you healthy  Get these tests  Blood pressure- Have your blood pressure checked once a year by your healthcare provider.  Normal blood pressure is 120/80  Weight- Have your body mass index (BMI) calculated to screen for obesity.  BMI is a measure of body fat based on height and weight. You can also calculate your own BMI at [email or web address].  Cholesterol- Have your cholesterol checked every year.  Diabetes- Have your blood sugar checked regularly if you have high blood pressure, high cholesterol, have a family history of diabetes or if you are overweight.  Screening for Colon Cancer- Colonoscopy starting at age 31.  Screening may begin sooner depending on your family history and other health conditions. Follow up colonoscopy as directed by your Gastroenterologist.  Screening for Prostate Cancer- Both blood work (PSA) and a rectal exam help screen for Prostate Cancer.  Screening begins at age 20 with African-American men and at age 78 with Caucasian men.  Screening may begin sooner depending on your family history.  Take these medicines  Aspirin- One aspirin daily can help prevent Heart disease and Stroke.  Flu shot- Every fall.  Tetanus- Every 10 years.  Zostavax- Once after the age of 30 to prevent Shingles.  Pneumonia shot- Once after the age of 23; if you are younger than 95, ask your healthcare provider if you need a Pneumonia shot.  Take these  steps  Don't smoke- If you do smoke, talk to your doctor about quitting.  For tips on how to quit, go to www.smokefree.gov or call 1-800-QUIT-NOW.  Be physically active- Exercise 5 days a week for at least 30 minutes.  If you are not already physically active start slow and gradually work up to 30 minutes of moderate physical activity.  Examples of moderate activity include walking briskly, mowing the yard, dancing, swimming, bicycling, etc.  Eat a healthy diet- Eat a variety of healthy food such as fruits, vegetables, low fat milk, low fat cheese, yogurt, lean meant, poultry, fish, beans, tofu, etc. For more information go to www.thenutritionsource.org  Drink alcohol in moderation- Limit alcohol intake to less than two drinks a day. Never drink and drive.  Dentist- Brush and floss twice daily; visit your dentist twice a year.  Depression- Your emotional health is as important as your physical health. If you're feeling down, or losing interest in things you would normally enjoy please talk to your healthcare provider.  Eye exam- Visit your eye doctor every year.  Safe sex- If you may be exposed to a sexually transmitted infection, use a condom.  Seat belts- Seat belts can save your life; always wear one.  Smoke/Carbon Monoxide detectors- These detectors need to be installed on the appropriate level of your home.  Replace batteries at least once a year.  Skin cancer- When out in the sun, cover up and use sunscreen 15 SPF or higher.  Violence- If anyone is threatening you, please tell your healthcare provider.  Living Will/ Health  care power of attorney- Speak with your healthcare provider and family.    IF you received an x-ray today, you will receive an invoice from [facility]. Please contact [facility] at [phone] with questions or concerns regarding your invoice.   IF you received labwork today, you will receive an invoice from [facility]. Please contact [facility]  at [phone] with questions or concerns regarding your invoice.   Our billing staff will not be able to assist you with questions regarding bills from these companies.  You will be contacted with the lab results as soon as they are available. The fastest way to get your results is to activate your My Chart account. Instructions are located on the last page of this paperwork. If you have not heard from [name] regarding the results in 2 weeks, please contact this office.

## 2017-05-12 NOTE — Progress Notes (Addendum)
Subjective:    Patient ID: Tommy Yang, male    DOB: 1951-02-24, 66 y.o.   MRN: [id number]  HPI Tommy Yang is a 66 y.o. male Presents today for: Chief Complaint  Patient presents with  . Annual Exam   Here for annual exam.. History of multiple medical problems including diabetes with microalbuminuria (followed by Dr. Dwyane Dee - A1c 6.9 in August), hyperlipidemia (Lipitor 40 mg daily), COPD (followed by Dr. Vaughan Browner,  RxSpiriva). 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 Claiborne Billings, overdue for follow up,  on metoprolol for rate control, Xarelto for anticoagulation), and HTN (metoprolol, losartan, Norvasc).  I saw him in January to establish care, previous patient of Dr. Everlene Farrier.  Cancer screening: Colonoscopy: December 2016, Dr. Ardis Hughs, repeat 5 years due to adenomatous polyp. Prostate: Diagnosed with prostate cancer approximately 2016. Status post radiation therapy for 8 weeks. Followed by urology, Dr. Louis Meckel. PSA 1.48 on 04/28/17 with urology. Plans to continue to monitor, with recheck in December  Immunizations: Immunization History  Administered Date(s) Administered  . Influenza,inj,Quad PF,6+ Mos 05/03/2014, 05/16/2015, 04/23/2016, 05/12/2017  . Pneumococcal Conjugate-13 01/11/2015  . Pneumococcal Polysaccharide-23 04/23/2016  . Tdap 10/08/2011  Shingles vaccine: trouble getting at [facility], and costly elsewhere. Would like to check elsewhere.   Fall screening: No falls in past year  Depression screening: Depression screen [facility] 2/9 05/12/2017 09/04/2016 04/23/2016 10/04/2015 08/30/2015  Decreased Interest 0 0 0 0 0  Down, Depressed, Hopeless 0 0 0 0 0  PHQ - 2 Score 0 0 0 0 0   Vision:  Visual Acuity Screening   Right eye Left eye Both eyes  Without correction: 20/30 20/30 20/30     With correction:     last optho appt end of July, has cataracts. No known retinopathy.   Dentist - no recent dentist visit - recommended.   Obesity/exercise; Walks 1 mile per day, mows lawn 3 times per week.  Body mass index is 43.62 kg/m. Wt Readings from Last 3 Encounters:  05/12/17 (!) 304 lb (137.9 kg)  04/03/17 (!) 306 lb (138.8 kg)  01/02/17 (!) 304 lb 3.2 oz (138 kg)   HTN: Had labs for chol and HTN at Dr. Dwyane Dee, possible plan for VAscepa instead of otc fish oil. Too expensive.  Tolerating blood pressure meds and Lipitor - needs refills.   Lab Results  Component Value Date   CREATININE 1.28 03/31/2017   Lab Results  Component Value Date   CHOL 149 12/30/2016   HDL 29.70 (L) 12/30/2016   LDLCALC 97 04/23/2016   LDLDIRECT 93.0 12/30/2016   TRIG 204.0 (H) 12/30/2016   CHOLHDL 5 12/30/2016    Review of Systems 13 point review of systems per patient health survey noted.  Negative other than as indicated  Above.     Objective:   Physical Exam  Constitutional: He is oriented to person, place, and time. He appears well-developed and well-nourished.  obese  HENT:  Head: Normocephalic and atraumatic.  Right Ear: External ear normal.  Left Ear: External ear normal.  Mouth/Throat: Oropharynx is clear and moist.  Eyes: Pupils are equal, round, and reactive to light. Conjunctivae and EOM are normal.  Neck: Normal range of motion. Neck supple. No thyromegaly present.  Cardiovascular: Normal rate, regular rhythm, normal heart sounds and intact distal pulses.   Pulmonary/Chest: Effort normal and breath  sounds normal. No respiratory distress. He has no wheezes.  Abdominal: Soft. He exhibits no distension. There is no tenderness.  Musculoskeletal: Normal range of motion. He exhibits no edema or tenderness.  Lymphadenopathy:    He has no cervical adenopathy.  Neurological: He is alert and oriented to person, place, and time. He has normal reflexes.  Skin: Skin is warm and  dry.  Psychiatric: He has a normal mood and affect. His behavior is normal.  Vitals reviewed.  Vitals:   05/12/17 0832  BP: (!) 154/80  Pulse: 76  Resp: 17  Temp: (!) 97.4 F (36.3 C)  TempSrc: Oral  SpO2: 98%  Weight: (!) 304 lb (137.9 kg)  Height: 5\' 10"  (1.778 m)  (out of losartan past few days).      Assessment & Plan:   Tommy Yang is a 66 y.o. male Annual physical exam  - -anticipatory guidance as below in AVS, screening labs above. Health maintenance items as above in HPI discussed/recommended as applicable.   Need for prophylactic vaccination and inoculation against influenza - Plan: Flu Vaccine QUAD 36+ mos IM  Need for shingles vaccine - Plan: Zoster Vac Recomb Adjuvanted [facility]) injection  Class 3 obesity with serious comorbidity and body mass index (BMI) of 40.0 to 44.9 in adult, unspecified obesity type ([place]) - Plan: Ambulatory referral to General Surgery  - Difficulty with weight loss, referred to bariatric surgeon to discuss options  Essential hypertension - Plan: losartan (COZAAR) 100 MG tablet, metoprolol tartrate (LOPRESSOR) 25 MG tablet, amLODipine (NORVASC) 2.5 MG tablet  -Uncontrolled as out of losartan, continue Norvasc, Lopressor same doses, restart losartan, monitor home readings and return if remains elevated  Gastroesophageal reflux disease, esophagitis presence not specified - Plan: Vitamin B12, VITAMIN D 25 Hydroxy (Vit-D Deficiency, Fractures), Magnesium Encounter for vitamin deficiency screening - Plan: Vitamin B12, VITAMIN D 25 Hydroxy (Vit-D Deficiency, Fractures), Magnesium  -Overall stable with PPI use, but will check labs as above with chronic PPI. Option of decreased dosing of PPI discussed.  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 Denies bleeding issues.  Atrial fibrillation, unspecified type ([place]) -  Plan: rivaroxaban (XARELTO) 20 MG TABS tablet  -Rate controlled with metoprolol, anticoagulated with Xarelto. Follow up with cardiology.  Hyperlipidemia, unspecified hyperlipidemia type - Plan: atorvastatin (LIPITOR) 40 MG tablet  -Continue Lipitor. Apparently other medication from endocrinology was cost prohibitive.  Meds ordered this encounter  Medications  . Zoster Vac Recomb Adjuvanted [facility]) injection    Sig: Inject 0.5 mLs into the muscle once. Repeat injection once in 2-6 months.    Dispense:  0.5 mL    Refill:  1  . losartan (COZAAR) 100 MG tablet    Sig: Take 1 tablet (100 mg total) by mouth daily.    Dispense:  90 tablet    Refill:  1  . rivaroxaban (XARELTO) 20 MG TABS tablet    Sig: TAKE ONE TABLET BY MOUTH ONCE DAILY WITH SUPPER    Dispense:  90 tablet    Refill:  1  . omeprazole (PRILOSEC) 40 MG capsule    Sig: One tablet daily    Dispense:  90 capsule    Refill:  3  . metoprolol tartrate (LOPRESSOR) 25 MG tablet    Sig: Take 1 tablet (25 mg total) by mouth 2 (two) times daily.    Dispense:  180 tablet    Refill:  1  . amLODipine (NORVASC) 2.5 MG tablet    Sig: Take 1 tablet (2.5 mg total) by mouth daily.    Dispense:  90 tablet    Refill:  1  . atorvastatin (LIPITOR) 40 MG tablet    Sig: TAKE ONE TABLET BY MOUTH ONCE DAILY.    Dispense:  90 tablet    Refill:  1   Patient Instructions   Call Dr. Claiborne Billings to schedule office visit.  Low intensity exercise until discussed with cardiologist.  I will refer you to bariatric surgeon to discuss weight loss options.  I will check vitamin B12, magnesium, vitamin D levels due to daily use of omeprazole. If you are able to decrease use of that medication to every other day, there is less chance of nutritional deficiencies. Monitor your blood pressure once you are back on losartan, and if remaining over 130/80, return to discuss changes.  Recheck in 6 months.  Keeping you healthy  Get these tests  Blood pressure-  Have your blood pressure checked once a year by your healthcare provider.  Normal blood pressure is 120/80  Weight- Have your body mass index (BMI) calculated to screen for obesity.  BMI is a measure of body fat based on height and weight. You can also calculate your own BMI at [email or web address].  Cholesterol- Have your cholesterol checked every year.  Diabetes- Have your blood sugar checked regularly if you have high blood pressure, high cholesterol, have a family history of diabetes or if you are overweight.  Screening for Colon Cancer- Colonoscopy starting at age 60.  Screening may begin sooner depending on your family history and other health conditions. Follow up colonoscopy as directed by your Gastroenterologist.  Screening for Prostate Cancer- Both blood work (PSA) and a rectal exam help screen for Prostate Cancer.  Screening begins at age 59 with African-American men and at age 22 with Caucasian men.  Screening may begin sooner depending on your family history.  Take these medicines  Aspirin- One aspirin daily can help prevent Heart disease and Stroke.  Flu shot- Every fall.  Tetanus- Every 10 years.  Zostavax- Once after the age of 33 to prevent Shingles.  Pneumonia shot- Once after the age of 24; if you are younger than 29, ask your healthcare provider if you need a Pneumonia shot.  Take these steps  Don't smoke- If you do smoke, talk to your doctor about quitting.  For tips on how to quit, go to www.smokefree.gov or call 1-800-QUIT-NOW.  Be physically active- Exercise 5 days a week for at least 30 minutes.  If you are not already physically active start slow and gradually work up to 30 minutes of moderate physical activity.  Examples of moderate activity include walking briskly, mowing the yard, dancing, swimming, bicycling, etc.  Eat a healthy diet- Eat a variety of healthy food such as fruits, vegetables, low fat milk, low fat cheese, yogurt, lean meant, poultry,  fish, beans, tofu, etc. For more information go to www.thenutritionsource.org  Drink alcohol in moderation- Limit alcohol intake to less than two drinks a day. Never drink and drive.  Dentist- Brush and floss twice daily; visit your dentist twice a year.  Depression- Your emotional health is as important as your physical health. If you're feeling down, or losing interest in things you would normally enjoy please talk to your healthcare provider.  Eye exam- Visit your eye doctor every year.  Safe sex- If you may be exposed to a sexually transmitted infection, use  a condom.  Seat belts- Seat belts can save your life; always wear one.  Smoke/Carbon Monoxide detectors- These detectors need to be installed on the appropriate level of your home.  Replace batteries at least once a year.  Skin cancer- When out in the sun, cover up and use sunscreen 15 SPF or higher.  Violence- If anyone is threatening you, please tell your healthcare provider.  Living Will/ Health care power of attorney- Speak with your healthcare provider and family.    IF you received an x-ray today, you will receive an invoice from [facility]. Please contact [facility] at [phone] with questions or concerns regarding your invoice.   IF you received labwork today, you will receive an invoice from [facility]. Please contact [facility] at [phone] with questions or concerns regarding your invoice.   Our billing staff will not be able to assist you with questions regarding bills from these companies.  You will be contacted with the lab results as soon as they are available. The fastest way to get your results is to activate your My Chart account. Instructions are located on the last page of this paperwork. If you have not heard from [name] regarding the results in 2 weeks, please contact this office.      Signed,   Merri Ray, MD [facility].  05/15/17 5:34  PM

## 2017-05-13 LAB — MAGNESIUM: Magnesium: 2.1 mg/dL (ref 1.6–2.3)

## 2017-05-13 LAB — VITAMIN D 25 HYDROXY (VIT D DEFICIENCY, FRACTURES): Vit D, 25-Hydroxy: 31.8 ng/mL (ref 30.0–100.0)

## 2017-05-13 LAB — VITAMIN B12: Vitamin B-12: 307 pg/mL (ref 232–1245)

## 2017-05-18 ENCOUNTER — Encounter: Payer: Self-pay | Admitting: *Deleted

## 2017-06-29 ENCOUNTER — Other Ambulatory Visit: Payer: Self-pay | Admitting: Endocrinology

## 2017-07-01 ENCOUNTER — Other Ambulatory Visit: Payer: Self-pay

## 2017-07-01 MED ORDER — GLIPIZIDE ER 2.5 MG PO TB24: ORAL_TABLET | ORAL | 2 refills | Status: DC

## 2017-07-18 DIAGNOSIS — S61211A Laceration without foreign body of left index finger without damage to nail, initial encounter: Secondary | ICD-10-CM | POA: Diagnosis not present

## 2017-08-03 ENCOUNTER — Other Ambulatory Visit (INDEPENDENT_AMBULATORY_CARE_PROVIDER_SITE_OTHER): Payer: BLUE CROSS/BLUE SHIELD

## 2017-08-03 DIAGNOSIS — E1165 Type 2 diabetes mellitus with hyperglycemia: Secondary | ICD-10-CM

## 2017-08-03 LAB — COMPREHENSIVE METABOLIC PANEL
ALT: 14 U/L (ref 0–53)
AST: 12 U/L (ref 0–37)
Albumin: 4.1 g/dL (ref 3.5–5.2)
Alkaline Phosphatase: 65 U/L (ref 39–117)
BUN: 23 mg/dL (ref 6–23)
CO2: 28 mEq/L (ref 19–32)
Calcium: 9.4 mg/dL (ref 8.4–10.5)
Chloride: 105 mEq/L (ref 96–112)
Creatinine, Ser: 1.31 mg/dL (ref 0.40–1.50)
GFR: 58.39 mL/min — ABNORMAL LOW (ref >60.00)
Glucose, Bld: 129 mg/dL — ABNORMAL HIGH (ref 70–99)
Potassium: 4.6 mEq/L (ref 3.5–5.1)
Sodium: 141 mEq/L (ref 135–145)
Total Bilirubin: 0.6 mg/dL (ref 0.2–1.2)
Total Protein: 7.1 g/dL (ref 6.0–8.3)

## 2017-08-03 LAB — LIPID PANEL
Cholesterol: 167 mg/dL (ref 0–200)
HDL: 32 mg/dL — ABNORMAL LOW (ref >39.00)
LDL Cholesterol: 102 mg/dL — ABNORMAL HIGH (ref 0–99)
NonHDL: 135.15
Total CHOL/HDL Ratio: 5
Triglycerides: 164 mg/dL — ABNORMAL HIGH (ref 0.0–149.0)
VLDL: 32.8 mg/dL (ref 0.0–40.0)

## 2017-08-03 LAB — HEMOGLOBIN A1C: Hgb A1c MFr Bld: 7.1 % — ABNORMAL HIGH (ref 4.6–6.5)

## 2017-08-04 DIAGNOSIS — Z8546 Personal history of malignant neoplasm of prostate: Secondary | ICD-10-CM | POA: Diagnosis not present

## 2017-08-04 LAB — MICROALBUMIN / CREATININE URINE RATIO
Creatinine,U: 83.6 mg/dL
Microalb Creat Ratio: 5.5 mg/g (ref 0.0–30.0)
Microalb, Ur: 4.6 mg/dL — ABNORMAL HIGH (ref 0.0–1.9)

## 2017-08-05 NOTE — Progress Notes (Signed)
Patient ID: Tommy Yang, male   DOB: 12-30-1950, 66 y.o.   MRN: [id number]           Reason for Appointment: Follow-up for Type 2 Diabetes   History of Present Illness:          Diagnosis: Type 2 diabetes mellitus, date of diagnosis: 05/2013       Past history:   He apparently had significant hyperglycemia in 2014 when seen in the urgent care center but did not establish with a PCP for control. He was started on treatment for his diabetes in 10/2013 and he was initially treated with metformin and Januvia A few months later he was also given glipizide ER to help his control when his A1c had gone up to 10.9 He says he was also seen by dietitian but no record available of this. Subsequently his blood sugar control had improved with A1c down to 6.8 in 9/15 On his consultation in 3/16 he had high blood sugars and weight gain; A1c was 9.1% with taking glipizide ER, Januvia and metformin.  Recent history:    Non-insulin hypoglycemic drugs the patient is taking are: Glipizide ER 2.5 mg in am, Invokamet 150/1000, 2 tablets daily, Trulicity 1.5 mg weekly  His A1c has been as low as 6.1 but now is up to 7.1  Current blood sugar patterns and problems identified:  He still has difficulty losing weight and appears to have gained some weight on today's measurement otherwise he thinks his weight was less elsewhere  He still tends to eat fruits frequently at breakfast and lunch and occasionally has readings as high as 171 after breakfast and 175 after lunch  He may have some [place] sausages for protein at breakfast  More recently has been trying to walk up to 2 miles daily  He was told to check on the coverage for Ozempic and he has not done so  Also having some difficulty with his test strips for his One Touch meter, this is about 64 years old  No side effects with taking Invokamet which he is taking regularly  Side effects from medications have been: None Compliance with the medical  regimen: Good   Hypoglycemia: None    Glucose monitoring:  done 1-2 times a day         Glucometer: One Touch.      Blood Glucose readings by analysis of monitor download   Mean values apply above for all meters except median for One Touch  PRE-MEAL Fasting Lunch Dinner Bedtime Overall  Glucose range: 117-156     110-1 75   Mean/median:  126    137   POST-MEAL PC Breakfast PC Lunch PC Dinner  Glucose range: 143-171   118, 156   Mean/median:  140        Self-care: The diet that the patient has been following is: tries to limit  fats .     Meals: 3 meals per day. Breakfast is sometimes cereal,sausage  Fruits, sometimes 2 at time           Exercise: walking  6/7 days up to 1 mile  Dietician visit, most recent:?  2015 .               Weight history:    Wt Readings from Last 3 Encounters:  08/06/17 (!) 314 lb 12.8 oz (142.8 kg)  05/12/17 (!) 304 lb (137.9 kg)  04/03/17 (!) 306 lb (138.8 kg)    Glycemic control:   Lab  Results  Component Value Date   HGBA1C 7.1 (H) 08/03/2017   HGBA1C 6.9 (H) 03/31/2017   HGBA1C 6.9 (H) 12/30/2016   Lab Results  Component Value Date   MICROALBUR 4.6 (H) 08/03/2017   LDLCALC 102 (H) 08/03/2017   CREATININE 1.31 08/03/2017    Lab on 08/03/2017  Component Date Value Ref Range Status  . Microalb, Ur 08/03/2017 4.6* 0.0 - 1.9 mg/dL Final  . Creatinine,U 08/03/2017 83.6  mg/dL Final  . Microalb Creat Ratio 08/03/2017 5.5  0.0 - 30.0 mg/g Final  . Cholesterol 08/03/2017 167  0 - 200 mg/dL Final   ATP III Classification       Desirable:  < 200 mg/dL               Borderline High:  200 - 239 mg/dL          High:  > = 240 mg/dL  . Triglycerides 08/03/2017 164.0* 0.0 - 149.0 mg/dL Final   Normal:  <150 mg/dLBorderline High:  150 - 199 mg/dL  . HDL 08/03/2017 32.00* >39.00 mg/dL Final  . VLDL 08/03/2017 32.8  0.0 - 40.0 mg/dL Final  . LDL Cholesterol 08/03/2017 102* 0 - 99 mg/dL Final  . Total CHOL/HDL Ratio 08/03/2017 5   Final                   Men          Women1/2 Average Risk     3.4          3.3Average Risk          5.0          4.42X Average Risk          9.6          7.13X Average Risk          15.0          11.0                      . NonHDL 08/03/2017 135.15   Final   NOTE:  Non-HDL goal should be 30 mg/dL higher than patient's LDL goal (i.e. LDL goal of < 70 mg/dL, would have non-HDL goal of < 100 mg/dL)  . Sodium 08/03/2017 141  135 - 145 mEq/L Final  . Potassium 08/03/2017 4.6  3.5 - 5.1 mEq/L Final  . Chloride 08/03/2017 105  96 - 112 mEq/L Final  . CO2 08/03/2017 28  19 - 32 mEq/L Final  . Glucose, Bld 08/03/2017 129* 70 - 99 mg/dL Final  . BUN 08/03/2017 23  6 - 23 mg/dL Final  . Creatinine, Ser 08/03/2017 1.31  0.40 - 1.50 mg/dL Final  . Total Bilirubin 08/03/2017 0.6  0.2 - 1.2 mg/dL Final  . Alkaline Phosphatase 08/03/2017 65  39 - 117 U/L Final  . AST 08/03/2017 12  0 - 37 U/L Final  . ALT 08/03/2017 14  0 - 53 U/L Final  . Total Protein 08/03/2017 7.1  6.0 - 8.3 g/dL Final  . Albumin 08/03/2017 4.1  3.5 - 5.2 g/dL Final  . Calcium 08/03/2017 9.4  8.4 - 10.5 mg/dL Final  . GFR 08/03/2017 58.39* >60.00 mL/min Final  . Hgb A1c MFr Bld 08/03/2017 7.1* 4.6 - 6.5 % Final   Glycemic Control Guidelines for People with Diabetes:Non Diabetic:  <6%Goal of Therapy: <7%Additional Action Suggested:  >8%       Allergies as of 08/06/2017   No Known Allergies  Medication List        Accurate as of 08/06/17  8:20 AM. Always use your most recent med list.          amLODipine 2.5 MG tablet Commonly known as:  NORVASC Take 1 tablet (2.5 mg total) by mouth daily.   atorvastatin 40 MG tablet Commonly known as:  LIPITOR TAKE ONE TABLET BY MOUTH ONCE DAILY.   blood glucose meter kit and supplies Kit Test blood sugar daily as directed. Dx code: E11.9   Canagliflozin-Metformin HCl ER [phone] MG Tb24 Commonly known as:  INVOKAMET XR Take 2 tablets by mouth daily with breakfast.   CINNAMON PO Take 1,000 mg  by mouth daily.   Fish Oil 1000 MG Caps Take 1,000 mg by mouth 2 (two) times daily.   glipiZIDE 2.5 MG 24 hr tablet Commonly known as:  GLUCOTROL XL TAKE 1 TABLET BY MOUTH ONCE DAILY WITH BREAKFAST   losartan 100 MG tablet Commonly known as:  COZAAR Take 1 tablet (100 mg total) by mouth daily.   metoprolol tartrate 25 MG tablet Commonly known as:  LOPRESSOR Take 1 tablet (25 mg total) by mouth 2 (two) times daily.   omeprazole 40 MG capsule Commonly known as:  PRILOSEC One tablet daily   ONE TOUCH ULTRA TEST test strip Generic drug:  glucose blood CHECK TWICE A DAY   rivaroxaban 20 MG Tabs tablet Commonly known as:  XARELTO TAKE ONE TABLET BY MOUTH ONCE DAILY WITH SUPPER   TRULICITY 1.5 VQ/2.5ZD Sopn Generic drug:  Dulaglutide INJECT CONTENTS OF 1 PEN PER WEEK   Vitamin D (Cholecalciferol) 400 units Tabs Take 400 Units by mouth daily.       Allergies: No Known Allergies  Past Medical History:  Diagnosis Date  . Diabetes mellitus without complication ([facility] [place])   . DVT (deep venous thrombosis) ([facility])    left leg.  [name] GERD (gastroesophageal reflux disease)   . Hypercholesterolemia   . Hypertension   . Prostate cancer ([place]) 12/06/13   gleason 4+3=7, 6/12 cores positive. seed implant and radiation    Past Surgical History:  Procedure Laterality Date  . APPENDECTOMY    . BALLOON DILATION N/A 08/02/2015   Procedure: BALLOON DILATION;  Surgeon: [name], MD;  Location: [facility];  Service: Endoscopy;  Laterality: N/A;  . COLONOSCOPY WITH PROPOFOL N/A 08/02/2015   Procedure: COLONOSCOPY WITH PROPOFOL w/ APC;  Surgeon: [name], MD;  Location: [facility];  Service: Endoscopy;  Laterality: N/A;  . ESOPHAGOGASTRODUODENOSCOPY (EGD) WITH PROPOFOL N/A 08/02/2015   Procedure: ESOPHAGOGASTRODUODENOSCOPY (EGD) WITH PROPOFOL/ possible dilation.;  Surgeon: [name], MD;  Location: [facility];  Service: Endoscopy;  Laterality: N/A;  . KNEE SURGERY     right  knee orthoscopic  . PROSTATE BIOPSY  12/06/13   Gleason 4+3=7, volume 35 gm  . [place]  . TONSILLECTOMY      Family History  Problem Relation Age of Onset  . Heart disease Mother        before age 73  . Diabetes Mother   . Hyperlipidemia Mother   . Varicose Veins Mother   . Heart disease Father   . Diabetes Father   . Cancer Neg Hx     Social History:  reports that he quit smoking about 5 years ago. His smoking use included cigarettes. He has a 11.50 pack-year smoking history. he has never used smokeless tobacco. He reports that he drinks alcohol.  He reports that he does not use drugs.    Review of Systems        Lipids: he has had significant hyperlipidemia and has been treated with Lipitor 40 mg daily; no history of CAD He has started taking Vascepa that was prescribed but is taking only one daily, he says this is too expensive from his [place] HDL and triglycerides are slightly better but LDL is above 100      Lab Results  Component Value Date   CHOL 167 08/03/2017   CHOL 149 12/30/2016   CHOL 164 04/23/2016   Lab Results  Component Value Date   HDL 32.00 (L) 08/03/2017   HDL 29.70 (L) 12/30/2016   HDL 32 (L) 04/23/2016   Lab Results  Component Value Date   LDLCALC 102 (H) 08/03/2017   LDLCALC 97 04/23/2016   LDLCALC 98 10/31/2014   Lab Results  Component Value Date   TRIG 164.0 (H) 08/03/2017   TRIG 204.0 (H) 12/30/2016   TRIG 175 (H) 04/23/2016   Lab Results  Component Value Date   CHOLHDL 5 08/03/2017   CHOLHDL 5 12/30/2016   CHOLHDL 5.1 (H) 04/23/2016   Lab Results  Component Value Date   LDLDIRECT 93.0 12/30/2016   LDLDIRECT 103.0 02/08/2015                   The blood pressure has been treated with losartan 100 mg and low-dose metoprolol, followed by PCP and cardiologist   BP Readings from Last 3 Encounters:  08/06/17 140/78  05/12/17 (!) 154/80  04/03/17 118/66    Renal function  stable with continuing Invokamet   LABS:  Lab on 08/03/2017  Component Date Value Ref Range Status  . Microalb, Ur 08/03/2017 4.6* 0.0 - 1.9 mg/dL Final  . Creatinine,U 08/03/2017 83.6  mg/dL Final  . Microalb Creat Ratio 08/03/2017 5.5  0.0 - 30.0 mg/g Final  . Cholesterol 08/03/2017 167  0 - 200 mg/dL Final   ATP III Classification       Desirable:  < 200 mg/dL               Borderline High:  200 - 239 mg/dL          High:  > = 240 mg/dL  . Triglycerides 08/03/2017 164.0* 0.0 - 149.0 mg/dL Final   Normal:  <150 mg/dLBorderline High:  150 - 199 mg/dL  . HDL 08/03/2017 32.00* >39.00 mg/dL Final  . VLDL 08/03/2017 32.8  0.0 - 40.0 mg/dL Final  . LDL Cholesterol 08/03/2017 102* 0 - 99 mg/dL Final  . Total CHOL/HDL Ratio 08/03/2017 5   Final                  Men          Women1/2 Average Risk     3.4          3.3Average Risk          5.0          4.42X Average Risk          9.6          7.13X Average Risk          15.0          11.0                      . NonHDL 08/03/2017 135.15   Final   NOTE:  Non-HDL goal should be  30 mg/dL higher than patient's LDL goal (i.e. LDL goal of < 70 mg/dL, would have non-HDL goal of < 100 mg/dL)  . Sodium 08/03/2017 141  135 - 145 mEq/L Final  . Potassium 08/03/2017 4.6  3.5 - 5.1 mEq/L Final  . Chloride 08/03/2017 105  96 - 112 mEq/L Final  . CO2 08/03/2017 28  19 - 32 mEq/L Final  . Glucose, Bld 08/03/2017 129* 70 - 99 mg/dL Final  . BUN 08/03/2017 23  6 - 23 mg/dL Final  . Creatinine, Ser 08/03/2017 1.31  0.40 - 1.50 mg/dL Final  . Total Bilirubin 08/03/2017 0.6  0.2 - 1.2 mg/dL Final  . Alkaline Phosphatase 08/03/2017 65  39 - 117 U/L Final  . AST 08/03/2017 12  0 - 37 U/L Final  . ALT 08/03/2017 14  0 - 53 U/L Final  . Total Protein 08/03/2017 7.1  6.0 - 8.3 g/dL Final  . Albumin 08/03/2017 4.1  3.5 - 5.2 g/dL Final  . Calcium 08/03/2017 9.4  8.4 - 10.5 mg/dL Final  . GFR 08/03/2017 58.39* >60.00 mL/min Final  . Hgb A1c MFr Bld 08/03/2017 7.1* 4.6  - 6.5 % Final   Glycemic Control Guidelines for People with Diabetes:Non Diabetic:  <6%Goal of Therapy: <7%Additional Action Suggested:  >8%     Physical Examination:  BP 140/78   Pulse 66   Ht '5\' 10"'  (1.778 m)   Wt (!) 314 lb 12.8 oz (142.8 kg)   SpO2 94%   BMI 45.17 kg/m   Diabetes type 2, uncontrolled   See history of present illness for detailed discussion of his current management, blood sugar patterns and problems identified    Blood sugars are mildly increased overall with A1c 7.1, has been better before Although he thinks he has lost some weight at home he appears to have gained some This is despite trying to walk He will probably benefit better from using Ozempic instead of Trulicity and discussed how this would be taken, he will check the coverage from his insurance company and call us back  He does need to try and modify his diet with less fruit intake, consider follow-up with dietitian Since he has difficulty with his test strips at times and he is using ultra 2 meter he was given a Verio meter, showed him how this would be useful in monitoring and labeling postprandial readings  He also needs to check blood sugars more often after supper which he has has not done, checking mostly preprandial readings   HYPERTENSION: Blood pressure is fairly well controlled  LIPIDS: He still has relatively high triglycerides, currently only taking 1 g of Vascepa and he does not want to continue this because of cost Given co-pay card and he will try the local coverage for this at the drugstore Discussed LDL targets, if LDL is not improved with increasing bicep I will consider Crestor   PLAN:  As above Follow-up in 3 months Continue to monitor his weight He will call when Ozempic is available on his formulary He will make sure he takes at least 2 capsules of Vascepa daily  Patient Instructions  Check Ozempic cost  Check blood sugars on waking up  3/7  Also check blood sugars  about 2 hours after a meal and do this after different meals by rotation  Recommended blood sugar levels on waking up is 90-130 and about 2 hours after meal is 130-160  Please bring your blood sugar monitor to each visit, thank you  Counseling time on subjects discussed in assessment and plan sections is over 50% of today's 25 minute visit   Jericca Russett 08/06/2017, 8:20 AM   Note: This office note was prepared with Estate agent. Any transcriptional errors that result from this process are unintentional.

## 2017-08-06 ENCOUNTER — Ambulatory Visit: Payer: BLUE CROSS/BLUE SHIELD | Admitting: Endocrinology

## 2017-08-06 ENCOUNTER — Encounter: Payer: Self-pay | Admitting: Endocrinology

## 2017-08-06 VITALS — BP 140/78 | HR 66 | Ht 70.0 in | Wt 314.8 lb

## 2017-08-06 DIAGNOSIS — E1165 Type 2 diabetes mellitus with hyperglycemia: Secondary | ICD-10-CM | POA: Diagnosis not present

## 2017-08-06 DIAGNOSIS — E782 Mixed hyperlipidemia: Secondary | ICD-10-CM

## 2017-08-06 MED ORDER — ICOSAPENT ETHYL 1 G PO CAPS: ORAL_CAPSULE | ORAL | 2 refills | Status: DC

## 2017-08-06 NOTE — Patient Instructions (Addendum)
Check Ozempic cost  Check blood sugars on waking up  3/7  Also check blood sugars about 2 hours after a meal and do this after different meals by rotation  Recommended blood sugar levels on waking up is 90-130 and about 2 hours after meal is 130-160  Please bring your blood sugar monitor to each visit, thank you  Vascepa 2 daily

## 2017-09-01 ENCOUNTER — Other Ambulatory Visit: Payer: Self-pay | Admitting: Endocrinology

## 2017-09-27 ENCOUNTER — Other Ambulatory Visit: Payer: Self-pay | Admitting: Endocrinology

## 2017-10-05 ENCOUNTER — Encounter: Payer: Self-pay | Admitting: Family Medicine

## 2017-10-05 ENCOUNTER — Other Ambulatory Visit: Payer: Self-pay

## 2017-10-05 ENCOUNTER — Ambulatory Visit: Payer: BLUE CROSS/BLUE SHIELD | Admitting: Family Medicine

## 2017-10-05 VITALS — BP 138/70 | HR 67 | Temp 98.5°F | Resp 18 | Ht 70.0 in | Wt 292.0 lb

## 2017-10-05 DIAGNOSIS — I10 Essential (primary) hypertension: Secondary | ICD-10-CM | POA: Diagnosis not present

## 2017-10-05 DIAGNOSIS — E66813 Obesity, class 3: Secondary | ICD-10-CM

## 2017-10-05 DIAGNOSIS — K219 Gastro-esophageal reflux disease without esophagitis: Secondary | ICD-10-CM | POA: Diagnosis not present

## 2017-10-05 DIAGNOSIS — Z6841 Body Mass Index (BMI) 40.0 and over, adult: Secondary | ICD-10-CM | POA: Diagnosis not present

## 2017-10-05 DIAGNOSIS — I825Z9 Chronic embolism and thrombosis of unspecified deep veins of unspecified distal lower extremity: Secondary | ICD-10-CM | POA: Diagnosis not present

## 2017-10-05 DIAGNOSIS — E785 Hyperlipidemia, unspecified: Secondary | ICD-10-CM | POA: Diagnosis not present

## 2017-10-05 DIAGNOSIS — I4891 Unspecified atrial fibrillation: Secondary | ICD-10-CM

## 2017-10-05 DIAGNOSIS — R0981 Nasal congestion: Secondary | ICD-10-CM

## 2017-10-05 MED ORDER — LOSARTAN POTASSIUM 100 MG PO TABS: 100.0000 mg | ORAL_TABLET | Freq: Every day | ORAL | 1 refills | Status: DC

## 2017-10-05 MED ORDER — AMLODIPINE BESYLATE 2.5 MG PO TABS: 2.5000 mg | ORAL_TABLET | Freq: Every day | ORAL | 1 refills | Status: DC

## 2017-10-05 MED ORDER — METOPROLOL TARTRATE 25 MG PO TABS: 25.0000 mg | ORAL_TABLET | Freq: Two times a day (BID) | ORAL | 1 refills | Status: DC

## 2017-10-05 MED ORDER — RIVAROXABAN 20 MG PO TABS: ORAL_TABLET | ORAL | 1 refills | Status: DC

## 2017-10-05 MED ORDER — ATORVASTATIN CALCIUM 40 MG PO TABS: ORAL_TABLET | ORAL | 1 refills | Status: DC

## 2017-10-05 NOTE — Progress Notes (Signed)
Subjective:  By signing my name below, I, Essence Howell, attest that this documentation has been prepared under the direction and in the presence of Wendie Agreste, MD Electronically Signed: Ladene Artist, ED Scribe 10/05/2017 at 2:52 PM.   Patient ID: Tommy Yang, male    DOB: 21-Jan-1951, 67 y.o.   MRN: [id number]  Chief Complaint  Patient presents with  . Medication Refill    all meds    HPI Tommy Yang is a 67 y.o. male who presents to Primary Care at [facility] for medication refills.  DM Followed by Dr. Dwyane Dee. Last appointment was 12/6, next appointment in March. May be adding Ozempic. Reports some blurry vision but he has seen an eye doctor around Aug. Lab Results  Component Value Date   HGBA1C 7.1 (H) 08/03/2017   Pt did not meet with a bariatric surgeon. States he has been exercising more and watching his diet recently. Wt Readings from Last 3 Encounters:  10/05/17 292 lb (132.5 kg)  08/06/17 (!) 314 lb 12.8 oz (142.8 kg)  05/12/17 (!) 304 lb (137.9 kg)   Hyperlipidemia Lab Results  Component Value Date   CHOL 167 08/03/2017   HDL 32.00 (L) 08/03/2017   LDLCALC 102 (H) 08/03/2017   LDLDIRECT 93.0 12/30/2016   TRIG 164.0 (H) 08/03/2017   CHOLHDL 5 08/03/2017   Lab Results  Component Value Date   ALT 14 08/03/2017   AST 12 08/03/2017   ALKPHOS 65 08/03/2017   BILITOT 0.6 08/03/2017  Lipitor 40 mg qd. Dr. Dwyane Dee reccommended increasing Vascepa to 2 tabs/daily. --- He is currently taking 2 tabs bid and has stopped fish oil. Denies myalgias or new side-effects.  COPD Pulm is [place]. Takes Spiriva.  GERD Omeprazole 40 mg qd. Normal magnesium, Vit D and B12 in Sept 2018. Pt denies breakthrough heartburn.  Chronic Anticoagulation for Chronic DVT and A-fib Takes Xarelto. Denies bleeding gum, blood in stools.  A-fib Cardiologist is Dr. Claiborne Billings. Last visit in 2017. Recommended sleep study at that time and 4 month f/u. Does not appear he's had sleep study.  Advised in Sept to call for appointment. Rate controlled with metoprolol, anticoagulation Xarelto. --- Pt states that he feels palpitations with sob that occurs once/month that can last a few hours. Symptoms have occurred and unchanged x 3 yrs and self-resolve with resting. Also states that he never had the sleep study done as he was unable to tolerate the mask due to intermittent nasal blockage.  HTN Losartan, Norvasc, metoprolol. Had been off Losartan at last visit. Continued on same dose of meds. Denies lightheadedness, dizziness, any side-effects. BP Readings from Last 3 Encounters:  10/05/17 138/70  08/06/17 140/78  05/12/17 (!) 154/80   Patient Active Problem List   Diagnosis Date Noted  . Microalbuminuria 04/23/2016  . PAF (paroxysmal atrial fibrillation) ([place]) 12/29/2015  . Rectal bleeding 07/18/2015  . Dysphagia 07/18/2015  . Chronic anticoagulation 03/14/2015  . Morbid obesity ([place]) 03/14/2015  . Exertional shortness of breath 01/12/2015  . Abnormal ECG 01/12/2015  . Encounter for screening colonoscopy 05/09/2014  . Esophageal reflux 05/09/2014  . Other dysphagia 05/09/2014  . Evaluate for OSA (obstructive sleep apnea) 02/28/2014  . Hyperlipidemia 02/28/2014  . Prostate cancer ([place]) 12/14/2013  . PSA elevation 10/26/2013  . Erectile dysfunction 10/26/2013  . Type II diabetes mellitus, uncontrolled ([place]) 10/19/2013  . DVT (deep venous thrombosis) ([place])   . Left leg DVT ([place]) 09/28/2013  . Leg edema, left 09/28/2013   Past Medical  History:  Diagnosis Date  . Diabetes mellitus without complication ([place])   . DVT (deep venous thrombosis) ([facility])    left leg.  [name] GERD (gastroesophageal reflux disease)   . Hypercholesterolemia   . Hypertension   . Prostate cancer ([place]) 12/06/13   gleason 4+3=7, 6/12 cores positive. seed implant and radiation   Past Surgical History:  Procedure Laterality Date  . APPENDECTOMY    . BALLOON DILATION N/A 08/02/2015   Procedure: BALLOON  DILATION;  Surgeon: [name], MD;  Location: [facility];  Service: Endoscopy;  Laterality: N/A;  . COLONOSCOPY WITH PROPOFOL N/A 08/02/2015   Procedure: COLONOSCOPY WITH PROPOFOL w/ APC;  Surgeon: [name], MD;  Location: [facility];  Service: Endoscopy;  Laterality: N/A;  . ESOPHAGOGASTRODUODENOSCOPY (EGD) WITH PROPOFOL N/A 08/02/2015   Procedure: ESOPHAGOGASTRODUODENOSCOPY (EGD) WITH PROPOFOL/ possible dilation.;  Surgeon: [name], MD;  Location: [facility];  Service: Endoscopy;  Laterality: N/A;  . KNEE SURGERY     right knee orthoscopic  . PROSTATE BIOPSY  12/06/13   Gleason 4+3=7, volume 35 gm  . [place]  . TONSILLECTOMY     No Known Allergies Prior to Admission medications   Medication Sig Start Date End Date Taking? Authorizing Provider  amLODipine (NORVASC) 2.5 MG tablet Take 1 tablet (2.5 mg total) by mouth daily. 05/12/17   Wendie Agreste, MD  atorvastatin (LIPITOR) 40 MG tablet TAKE ONE TABLET BY MOUTH ONCE DAILY. 05/12/17   Wendie Agreste, MD  blood glucose meter kit and supplies KIT Test blood sugar daily as directed. Dx code: E11.9 01/23/15   Darlyne Russian, MD  Canagliflozin-Metformin HCl ER (INVOKAMET XR) [phone] MG TB24 Take 2 tablets by mouth daily with breakfast. 01/02/17   Elayne Snare, MD  CINNAMON PO Take 1,000 mg by mouth daily.    [provider]  glipiZIDE (GLUCOTROL XL) 2.5 MG 24 hr tablet TAKE 1 TABLET BY MOUTH ONCE DAILY WITH BREAKFAST 07/01/17   Elayne Snare, MD  Icosapent Ethyl (VASCEPA) 1 g CAPS 2 capsules twice a day 08/06/17   Elayne Snare, MD  losartan (COZAAR) 100 MG tablet Take 1 tablet (100 mg total) by mouth daily. 05/12/17   Wendie Agreste, MD  metoprolol tartrate (LOPRESSOR) 25 MG tablet Take 1 tablet (25 mg total) by mouth 2 (two) times daily. 05/12/17   Wendie Agreste, MD  Omega-3 Fatty Acids (FISH OIL) 1000 MG CAPS Take 1,000 mg by mouth 2 (two) times daily.    [provider]  omeprazole (PRILOSEC) 40 MG capsule One tablet daily 05/12/17   Wendie Agreste, MD  ONE [facility] ULTRA TEST test strip CHECK TWICE A DAY 09/01/17   Elayne Snare, MD  rivaroxaban (XARELTO) 20 MG TABS tablet TAKE ONE TABLET BY MOUTH ONCE DAILY WITH SUPPER 05/12/17   Wendie Agreste, MD  TRULICITY 1.5 GL/8.7FI SOPN INJECT CONTENTS OF 1 PEN PER WEEK 09/28/17   Elayne Snare, MD  Vitamin D, Cholecalciferol, 400 UNITS TABS Take 400 Units by mouth daily.    [provider]   Social History   Socioeconomic History  . Marital status: Married    Spouse name: Not on file  . Number of children: 1  . Years of education: Not on file  . Highest education level: Not on file  Social Needs  . Financial resource strain: Not on file  . Food insecurity - worry: Not on file  .  Food insecurity - inability: Not on file  . Transportation needs - medical: Not on file  . Transportation needs - non-medical: Not on file  Occupational History    Employer: RETIRED  Tobacco Use  . Smoking status: Former Smoker    Packs/day: 0.50    Years: 23.00    Pack years: 11.50    Types: Cigarettes    Last attempt to quit: 08/01/2012    Years since quitting: 5.1  . Smokeless tobacco: Never Used  Substance and Sexual Activity  . Alcohol use: Yes    Alcohol/week: 0.0 oz    Comment: very seldom, maybe 3x per year  . Drug use: No  . Sexual activity: Yes    Birth control/protection: Abstinence  Other Topics Concern  . 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  Eyes: Negative for visual disturbance.  Respiratory: Negative for cough, chest tightness and shortness of breath.     Cardiovascular: Positive for palpitations (occasional). Negative for chest pain and leg swelling.  Gastrointestinal: Negative for abdominal pain and blood in stool.  Musculoskeletal: Negative for myalgias.  Neurological: Negative for dizziness, light-headedness and headaches.      Objective:   Physical Exam  Constitutional: He is oriented to person, place, and time. He appears well-developed and well-nourished.  HENT:  Head: Normocephalic and atraumatic.  Nasal passages overall look ok  Eyes: EOM are normal. Pupils are equal, round, and reactive to light.  Neck: No JVD present. Carotid bruit is not present.  Cardiovascular: Normal rate, regular rhythm and normal heart sounds.  No murmur heard. Regular with rare ectopic beat  Pulmonary/Chest: Effort normal and breath sounds normal. He has no rales.  Musculoskeletal: He exhibits edema.  1+ pedal edema with hyperpigmentation on LE  Neurological: He is alert and oriented to person, place, and time.  Skin: Skin is warm and dry.  Psychiatric: He has a normal mood and affect.  Vitals reviewed.  Vitals:   10/05/17 1436  BP: 138/70  Pulse: 67  Resp: 18  Temp: 98.5 F (36.9 C)  TempSrc: Oral  SpO2: 95%  Weight: 292 lb (132.5 kg)  Height: '5\' 10"'  (1.778 m)  Body mass index is 41.9 kg/m.    Assessment & Plan:   Tommy Yang is a 67 y.o. male Gastroesophageal reflux disease, esophagitis presence not specified  - Stable, continue PPI daily. Previous electrolyte testing overall looked okay  Essential hypertension - Plan: amLODipine (NORVASC) 2.5 MG tablet, losartan (COZAAR) 100 MG tablet, metoprolol tartrate (LOPRESSOR) 25 MG tablet  -  Stable, tolerating current regimen. Medications refilled. Labs pending as above.   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   Medications refilled.   Atrial fibrillation, unspecified type ([place]) - Plan: rivaroxaban (XARELTO) 20 MG TABS tablet  - tolerating Xarelto. No new symptoms. Follow up with cardiology.    Hyperlipidemia, unspecified hyperlipidemia type - Plan: atorvastatin (LIPITOR) 40 MG tablet  - Tolerating statin at current dose. No changes.  Class 3  severe obesity with serious comorbidity and body mass index (BMI) of 40.0 to 44.9 in adult, unspecified obesity type (HCC)  -Diet, portion control, activity/exercise is tolerated with other chronic medical problems.  Nasal congestion - Plan: Ambulatory referral to ENT  -History of suspected obstructive sleep apnea, nasal congestion that kept him from testing/treated with CPAP. We'll refer to ENT for evaluation on management options  Meds ordered this encounter  Medications  . amLODipine (NORVASC) 2.5 MG tablet    Sig: Take 1 tablet (2.5 mg total) by mouth daily.    Dispense:  90 tablet    Refill:  1  . losartan (COZAAR) 100 MG tablet    Sig: Take 1 tablet (100 mg total) by mouth daily.    Dispense:  90 tablet    Refill:  1  . metoprolol tartrate (LOPRESSOR) 25 MG tablet    Sig: Take 1 tablet (25 mg total) by mouth 2 (two) times daily.    Dispense:  180 tablet    Refill:  1  . rivaroxaban (XARELTO) 20 MG TABS tablet    Sig: TAKE ONE TABLET BY MOUTH ONCE DAILY WITH SUPPER    Dispense:  90 tablet    Refill:  1  . atorvastatin (LIPITOR) 40 MG tablet    Sig: TAKE ONE TABLET BY MOUTH ONCE DAILY.    Dispense:  90 tablet    Refill:  1   Patient Instructions   Keep appointment with cardiologist as planned. If any worsening shortness of breath, or new symptoms prior - be seen here or emergency room.   I will refer you to Ear Nose and Throat specialist to discuss nasal issues as those are preventing use of CPAP or testing.  Follow up in 6 months. Let me know if there are questions prior. Thanks for coming in today.     IF you received an x-ray today,  you will receive an invoice from [facility]. Please contact [facility] at [phone] with questions or concerns regarding your invoice.   IF you received labwork today, you will receive an invoice from [facility]. Please contact [facility] at [phone] with questions or concerns regarding your invoice.   Our billing staff will not be able to assist you with questions regarding bills from these companies.  You will be contacted with the lab results as soon as they are available. The fastest way to get your results is to activate your My Chart account. Instructions are located on the last page of this paperwork. If you have not heard from [name] regarding the results in 2 weeks, please contact this office.      I personally performed the services described in this documentation, which was scribed in my presence. The recorded information has been reviewed and considered for accuracy and completeness, addended by me as needed, and agree with information above.  Signed,   Merri Ray, MD [facility] at [place].  10/07/17 12:30 PM

## 2017-10-05 NOTE — Patient Instructions (Addendum)
Keep appointment with cardiologist as planned. If any worsening shortness of breath, or new symptoms prior - be seen here or emergency room.   I will refer you to Ear Nose and Throat specialist to discuss nasal issues as those are preventing use of CPAP or testing.  Follow up in 6 months. Let me know if there are questions prior. Thanks for coming in today.     IF you received an x-ray today, you will receive an invoice from [facility]. Please contact [facility] at [phone] with questions or concerns regarding your invoice.   IF you received labwork today, you will receive an invoice from [place]. Please contact [facility] at [phone] with questions or concerns regarding your invoice.   Our billing staff will not be able to assist you with questions regarding bills from these companies.  You will be contacted with the lab results as soon as they are available. The fastest way to get your results is to activate your My Chart account. Instructions are located on the last page of this paperwork. If you have not heard from [name] regarding the results in 2 weeks, please contact this office.

## 2017-10-07 ENCOUNTER — Encounter: Payer: Self-pay | Admitting: Family Medicine

## 2017-10-29 ENCOUNTER — Other Ambulatory Visit (INDEPENDENT_AMBULATORY_CARE_PROVIDER_SITE_OTHER): Payer: BLUE CROSS/BLUE SHIELD

## 2017-10-29 DIAGNOSIS — E1165 Type 2 diabetes mellitus with hyperglycemia: Secondary | ICD-10-CM | POA: Diagnosis not present

## 2017-10-29 DIAGNOSIS — E782 Mixed hyperlipidemia: Secondary | ICD-10-CM | POA: Diagnosis not present

## 2017-10-29 LAB — COMPREHENSIVE METABOLIC PANEL
ALT: 17 U/L (ref 0–53)
AST: 13 U/L (ref 0–37)
Albumin: 4 g/dL (ref 3.5–5.2)
Alkaline Phosphatase: 61 U/L (ref 39–117)
BUN: 23 mg/dL (ref 6–23)
CO2: 30 mEq/L (ref 19–32)
Calcium: 9.8 mg/dL (ref 8.4–10.5)
Chloride: 103 mEq/L (ref 96–112)
Creatinine, Ser: 1.33 mg/dL (ref 0.40–1.50)
GFR: 57.34 mL/min — ABNORMAL LOW (ref >60.00)
Glucose, Bld: 124 mg/dL — ABNORMAL HIGH (ref 70–99)
Potassium: 4.7 mEq/L (ref 3.5–5.1)
Sodium: 140 mEq/L (ref 135–145)
Total Bilirubin: 0.6 mg/dL (ref 0.2–1.2)
Total Protein: 7.5 g/dL (ref 6.0–8.3)

## 2017-10-29 LAB — LIPID PANEL
Cholesterol: 153 mg/dL (ref 0–200)
HDL: 30.4 mg/dL — ABNORMAL LOW (ref >39.00)
LDL Cholesterol: 92 mg/dL (ref 0–99)
NonHDL: 122.39
Total CHOL/HDL Ratio: 5
Triglycerides: 152 mg/dL — ABNORMAL HIGH (ref 0.0–149.0)
VLDL: 30.4 mg/dL (ref 0.0–40.0)

## 2017-10-29 LAB — HEMOGLOBIN A1C: Hgb A1c MFr Bld: 7.2 % — ABNORMAL HIGH (ref 4.6–6.5)

## 2017-11-03 DIAGNOSIS — Z8546 Personal history of malignant neoplasm of prostate: Secondary | ICD-10-CM | POA: Diagnosis not present

## 2017-11-03 NOTE — Progress Notes (Signed)
Patient ID: Tommy Yang, male   DOB: 1950-11-18, 67 y.o.   MRN: [id number]           Reason for Appointment: Follow-up for Type 2 Diabetes   History of Present Illness:          Diagnosis: Type 2 diabetes mellitus, date of diagnosis: 05/2013       Past history:   He apparently had significant hyperglycemia in 2014 when seen in the urgent care center but did not establish with a PCP for control. He was started on treatment for his diabetes in 10/2013 and he was initially treated with metformin and Januvia A few months later he was also given glipizide ER to help his control when his A1c had gone up to 10.9 He says he was also seen by dietitian but no record available of this. Subsequently his blood sugar control had improved with A1c down to 6.8 in 9/15 On his consultation in 3/16 he had high blood sugars and weight gain; A1c was 9.1% with taking glipizide ER, Januvia and metformin.  Recent history:    Non-insulin hypoglycemic drugs the patient is taking are: Glipizide ER 2.5 mg in am, Invokamet 150/1000, 2 tablets daily, Trulicity 1.5 mg weekly  His A1c has been as low as 6.1 but now is up to 7.1  Current blood sugar patterns and problems identified:  He still has difficulty losing weight and appears to have gained some weight on today's measurement otherwise he thinks his weight was less elsewhere  He still tends to eat fruits frequently at breakfast and lunch and occasionally has readings as high as 171 after breakfast and 175 after lunch  He may have some [place] sausages for protein at breakfast  More recently has been trying to walk up to 2 miles daily  He was told to check on the coverage for Ozempic and he has not done so  Also having some difficulty with his test strips for his One Touch meter, this is about 65 years old  No side effects with taking Invokamet which he is taking regularly  Side effects from medications have been: None  Compliance with the medical  regimen: Good   Hypoglycemia: None    Glucose monitoring:  done 1-2 times a day         Glucometer: One Touch.      Blood Glucose readings by analysis of monitor download   Mean values apply above for all meters except median for One Touch  PRE-MEAL Fasting Lunch Dinner Bedtime Overall  Glucose range:       Mean/median:     148   POST-MEAL PC Breakfast PC Lunch PC Dinner  Glucose range:     Mean/median:        Mean values apply above for all meters except median for One Touch  PRE-MEAL Fasting Lunch Dinner Bedtime Overall  Glucose range: 117-156     110-1 75   Mean/median:  126    137   POST-MEAL PC Breakfast PC Lunch PC Dinner  Glucose range: 143-171   118, 156   Mean/median:  140      Self-care: The diet that the patient has been following is: tries to limit  fats .     Meals: 3 meals per day. Breakfast is sometimes cereal,sausage  Fruits for snacks  Exercise: walking less  Dietician visit, most recent:?  2015 .               Weight history:  Wt Readings from Last 3 Encounters:  11/04/17 (!) 314 lb 3.2 oz (142.5 kg)  10/05/17 292 lb (132.5 kg)  08/06/17 (!) 314 lb 12.8 oz (142.8 kg)    Glycemic control:   Lab Results  Component Value Date   HGBA1C 7.2 (H) 10/29/2017   HGBA1C 7.1 (H) 08/03/2017   HGBA1C 6.9 (H) 03/31/2017   Lab Results  Component Value Date   MICROALBUR 4.6 (H) 08/03/2017   LDLCALC 92 10/29/2017   CREATININE 1.33 10/29/2017    Lab on 10/29/2017  Component Date Value Ref Range Status  . Cholesterol 10/29/2017 153  0 - 200 mg/dL Final   ATP III Classification       Desirable:  < 200 mg/dL               Borderline High:  200 - 239 mg/dL          High:  > = 240 mg/dL  . Triglycerides 10/29/2017 152.0* 0.0 - 149.0 mg/dL Final   Normal:  <150 mg/dLBorderline High:  150 - 199 mg/dL  . HDL 10/29/2017 30.40* >39.00 mg/dL Final  . VLDL 10/29/2017 30.4  0.0 - 40.0 mg/dL Final  . LDL Cholesterol 10/29/2017 92  0 - 99 mg/dL Final  . Total  CHOL/HDL Ratio 10/29/2017 5   Final                  Men          Women1/2 Average Risk     3.4          3.3Average Risk          5.0          4.42X Average Risk          9.6          7.13X Average Risk          15.0          11.0                      . NonHDL 10/29/2017 122.39   Final   NOTE:  Non-HDL goal should be 30 mg/dL higher than patient's LDL goal (i.e. LDL goal of < 70 mg/dL, would have non-HDL goal of < 100 mg/dL)  . Sodium 10/29/2017 140  135 - 145 mEq/L Final  . Potassium 10/29/2017 4.7  3.5 - 5.1 mEq/L Final  . Chloride 10/29/2017 103  96 - 112 mEq/L Final  . CO2 10/29/2017 30  19 - 32 mEq/L Final  . Glucose, Bld 10/29/2017 124* 70 - 99 mg/dL Final  . BUN 10/29/2017 23  6 - 23 mg/dL Final  . Creatinine, Ser 10/29/2017 1.33  0.40 - 1.50 mg/dL Final  . Total Bilirubin 10/29/2017 0.6  0.2 - 1.2 mg/dL Final  . Alkaline Phosphatase 10/29/2017 61  39 - 117 U/L Final  . AST 10/29/2017 13  0 - 37 U/L Final  . ALT 10/29/2017 17  0 - 53 U/L Final  . Total Protein 10/29/2017 7.5  6.0 - 8.3 g/dL Final  . Albumin 10/29/2017 4.0  3.5 - 5.2 g/dL Final  . Calcium 10/29/2017 9.8  8.4 - 10.5 mg/dL Final  . GFR 10/29/2017 57.34* >60.00 mL/min Final  . Hgb A1c MFr Bld 10/29/2017 7.2* 4.6 - 6.5 % Final   Glycemic Control Guidelines for People with Diabetes:Non Diabetic:  <6%Goal of Therapy: <7%Additional Action Suggested:  >8%       Allergies as of  11/04/2017   No Known Allergies     Medication List        Accurate as of 11/04/17  8:11 AM. Always use your most recent med list.          amLODipine 2.5 MG tablet Commonly known as:  NORVASC Take 1 tablet (2.5 mg total) by mouth daily.   atorvastatin 40 MG tablet Commonly known as:  LIPITOR TAKE ONE TABLET BY MOUTH ONCE DAILY.   blood glucose meter kit and supplies Kit Test blood sugar daily as directed. Dx code: E11.9   Canagliflozin-metFORMIN HCl ER [phone] MG Tb24 Commonly known as:  INVOKAMET XR Take 2 tablets by mouth daily  with breakfast.   CINNAMON PO Take 1,000 mg by mouth daily.   Fish Oil 1000 MG Caps Take 1,000 mg by mouth 2 (two) times daily.   glipiZIDE 2.5 MG 24 hr tablet Commonly known as:  GLUCOTROL XL TAKE 1 TABLET BY MOUTH ONCE DAILY WITH BREAKFAST   Icosapent Ethyl 1 g Caps Commonly known as:  VASCEPA 2 capsules twice a day   losartan 100 MG tablet Commonly known as:  COZAAR Take 1 tablet (100 mg total) by mouth daily.   metoprolol tartrate 25 MG tablet Commonly known as:  LOPRESSOR Take 1 tablet (25 mg total) by mouth 2 (two) times daily.   omeprazole 40 MG capsule Commonly known as:  PRILOSEC One tablet daily   ONE TOUCH ULTRA TEST test strip Generic drug:  glucose blood CHECK TWICE A DAY   rivaroxaban 20 MG Tabs tablet Commonly known as:  XARELTO TAKE ONE TABLET BY MOUTH ONCE DAILY WITH SUPPER   TRULICITY 1.5 EZ/6.6QH Sopn Generic drug:  Dulaglutide INJECT CONTENTS OF 1 PEN PER WEEK   Vitamin D (Cholecalciferol) 400 units Tabs Take 400 Units by mouth daily.       Allergies: No Known Allergies  Past Medical History:  Diagnosis Date  . Diabetes mellitus without complication ([place])   . DVT (deep venous thrombosis) ([facility])    left leg.  [name] GERD (gastroesophageal reflux disease)   . Hypercholesterolemia   . Hypertension   . Prostate cancer ([place]) 12/06/13   gleason 4+3=7, 6/12 cores positive. seed implant and radiation    Past Surgical History:  Procedure Laterality Date  . APPENDECTOMY    . BALLOON DILATION N/A 08/02/2015   Procedure: BALLOON DILATION;  Surgeon: [name], MD;  Location: [facility];  Service: Endoscopy;  Laterality: N/A;  . COLONOSCOPY WITH PROPOFOL N/A 08/02/2015   Procedure: COLONOSCOPY WITH PROPOFOL w/ APC;  Surgeon: [name], MD;  Location: [facility];  Service: Endoscopy;  Laterality: N/A;  . ESOPHAGOGASTRODUODENOSCOPY (EGD) WITH PROPOFOL N/A 08/02/2015   Procedure: ESOPHAGOGASTRODUODENOSCOPY (EGD) WITH PROPOFOL/ possible  dilation.;  Surgeon: [name], MD;  Location: [facility];  Service: Endoscopy;  Laterality: N/A;  . KNEE SURGERY     right knee orthoscopic  . PROSTATE BIOPSY  12/06/13   Gleason 4+3=7, volume 35 gm  . [place]  . TONSILLECTOMY      Family History  Problem Relation Age of Onset  . Heart disease Mother        before age 59  . Diabetes Mother   . Hyperlipidemia Mother   . Varicose Veins Mother   . Heart disease Father   . Diabetes Father   . Cancer Neg Hx     Social History:  reports that he quit smoking about  5 years ago. His smoking use included cigarettes. He has a 11.50 pack-year smoking history. he has never used smokeless tobacco. He reports that he drinks alcohol. He reports that he does not use drugs.    Review of Systems        Lipids: he has had significant hyperlipidemia and has been treated with Lipitor 40 mg daily and is also taking Vascepa for triglycerides Has no history of CAD This is his diet is relatively low in fat  Continues to have low HDL and triglycerides are slightly better along with LDL which is now below 100       Lab Results  Component Value Date   CHOL 153 10/29/2017   CHOL 167 08/03/2017   CHOL 149 12/30/2016   Lab Results  Component Value Date   HDL 30.40 (L) 10/29/2017   HDL 32.00 (L) 08/03/2017   HDL 29.70 (L) 12/30/2016   Lab Results  Component Value Date   LDLCALC 92 10/29/2017   LDLCALC 102 (H) 08/03/2017   LDLCALC 97 04/23/2016   Lab Results  Component Value Date   TRIG 152.0 (H) 10/29/2017   TRIG 164.0 (H) 08/03/2017   TRIG 204.0 (H) 12/30/2016   Lab Results  Component Value Date   CHOLHDL 5 10/29/2017   CHOLHDL 5 08/03/2017   CHOLHDL 5 12/30/2016   Lab Results  Component Value Date   LDLDIRECT 93.0 12/30/2016   LDLDIRECT 103.0 02/08/2015                   The blood pressure has been treated with losartan 100 mg and low-dose metoprolol, followed by PCP and  cardiologist   BP Readings from Last 3 Encounters:  11/04/17 120/68  10/05/17 138/70  08/06/17 140/78    Renal function stable with continuing Invokamet   LABS:  Lab on 10/29/2017  Component Date Value Ref Range Status  . Cholesterol 10/29/2017 153  0 - 200 mg/dL Final   ATP III Classification       Desirable:  < 200 mg/dL               Borderline High:  200 - 239 mg/dL          High:  > = 240 mg/dL  . Triglycerides 10/29/2017 152.0* 0.0 - 149.0 mg/dL Final   Normal:  <150 mg/dLBorderline High:  150 - 199 mg/dL  . HDL 10/29/2017 30.40* >39.00 mg/dL Final  . VLDL 10/29/2017 30.4  0.0 - 40.0 mg/dL Final  . LDL Cholesterol 10/29/2017 92  0 - 99 mg/dL Final  . Total CHOL/HDL Ratio 10/29/2017 5   Final                  Men          Women1/2 Average Risk     3.4          3.3Average Risk          5.0          4.42X Average Risk          9.6          7.13X Average Risk          15.0          11.0                      . NonHDL 10/29/2017 122.39   Final   NOTE:  Non-HDL goal should be 30 mg/dL higher than  patient's LDL goal (i.e. LDL goal of < 70 mg/dL, would have non-HDL goal of < 100 mg/dL)  . Sodium 10/29/2017 140  135 - 145 mEq/L Final  . Potassium 10/29/2017 4.7  3.5 - 5.1 mEq/L Final  . Chloride 10/29/2017 103  96 - 112 mEq/L Final  . CO2 10/29/2017 30  19 - 32 mEq/L Final  . Glucose, Bld 10/29/2017 124* 70 - 99 mg/dL Final  . BUN 10/29/2017 23  6 - 23 mg/dL Final  . Creatinine, Ser 10/29/2017 1.33  0.40 - 1.50 mg/dL Final  . Total Bilirubin 10/29/2017 0.6  0.2 - 1.2 mg/dL Final  . Alkaline Phosphatase 10/29/2017 61  39 - 117 U/L Final  . AST 10/29/2017 13  0 - 37 U/L Final  . ALT 10/29/2017 17  0 - 53 U/L Final  . Total Protein 10/29/2017 7.5  6.0 - 8.3 g/dL Final  . Albumin 10/29/2017 4.0  3.5 - 5.2 g/dL Final  . Calcium 10/29/2017 9.8  8.4 - 10.5 mg/dL Final  . GFR 10/29/2017 57.34* >60.00 mL/min Final  . Hgb A1c MFr Bld 10/29/2017 7.2* 4.6 - 6.5 % Final   Glycemic Control  Guidelines for People with Diabetes:Non Diabetic:  <6%Goal of Therapy: <7%Additional Action Suggested:  >8%     Physical Examination:  BP 120/68 (BP Location: Left Arm, Patient Position: Sitting, Cuff Size: Large)   Pulse 80   Wt (!) 314 lb 3.2 oz (142.5 kg)   SpO2 93%   BMI 45.08 kg/m   Diabetes type 2, uncontrolled   See history of present illness for detailed discussion of his current management, blood sugar patterns and problems identified    Blood sugars are mildly increased overall with A1c 7.2 and gradually going up This is mostly related to postprandial hyperglycemia even though he says that he is eating relatively small portions and not large amounts of high fat or high carbohydrate meals Again highest blood sugars are after lunch and supper especially in the evening and also fasting readings are relatively high Highest blood sugar 216  He does not exercise and needs to start back   HYPERTENSION: Blood pressure is well controlled  LIPIDS: He is showing better results with taking 4 capsules Vascepa in addition to his Lipitor   PLAN:  We will check to see if Ozempic and not covered as this should be more effective than Trulicity Showed him how to use the pen device for this and the dose is titration with 0.25 mg initially After 2 weeks he will take 0.5 mg every week Start back on walking  There are no Patient Instructions on file for this visit.      Elayne Snare 11/04/2017, 8:11 AM   Note: This office note was prepared with Dragon voice recognition system technology. Any transcriptional errors that result from this process are unintentional.

## 2017-11-04 ENCOUNTER — Encounter: Payer: Self-pay | Admitting: Endocrinology

## 2017-11-04 ENCOUNTER — Ambulatory Visit: Payer: BLUE CROSS/BLUE SHIELD | Admitting: Endocrinology

## 2017-11-04 VITALS — BP 120/68 | HR 80 | Wt 314.2 lb

## 2017-11-04 DIAGNOSIS — E1165 Type 2 diabetes mellitus with hyperglycemia: Secondary | ICD-10-CM | POA: Diagnosis not present

## 2017-11-04 NOTE — Patient Instructions (Signed)
Start OZEMPIC using the pen as shown once weekly on the same day of the week.   You may inject in the stomach, thigh or arm as indicated in the brochure given. If you have any difficulties using the pen see the video at [email or web address]  You will feel fullness of the stomach with starting the medication and should try to keep the portions at meals small.  You may experience nausea in the first few days which usually gets better over time    If you have any questions or concerns please call the office or talk to our nurse educator Leonia Reader   You may also talk to a nurse educator with the company at [phone] Useful website: [email or web address]

## 2017-11-09 DIAGNOSIS — C61 Malignant neoplasm of prostate: Secondary | ICD-10-CM | POA: Diagnosis not present

## 2017-11-09 DIAGNOSIS — R9721 Rising PSA following treatment for malignant neoplasm of prostate: Secondary | ICD-10-CM | POA: Diagnosis not present

## 2017-11-10 ENCOUNTER — Other Ambulatory Visit: Payer: Self-pay | Admitting: Urology

## 2017-11-10 DIAGNOSIS — J310 Chronic rhinitis: Secondary | ICD-10-CM | POA: Diagnosis not present

## 2017-11-10 DIAGNOSIS — I824Y3 Acute embolism and thrombosis of unspecified deep veins of proximal lower extremity, bilateral: Secondary | ICD-10-CM

## 2017-11-10 DIAGNOSIS — J342 Deviated nasal septum: Secondary | ICD-10-CM | POA: Diagnosis not present

## 2017-11-10 DIAGNOSIS — J343 Hypertrophy of nasal turbinates: Secondary | ICD-10-CM | POA: Diagnosis not present

## 2017-11-10 DIAGNOSIS — M7989 Other specified soft tissue disorders: Principal | ICD-10-CM

## 2017-11-10 DIAGNOSIS — M79604 Pain in right leg: Secondary | ICD-10-CM

## 2017-11-16 ENCOUNTER — Other Ambulatory Visit: Payer: Self-pay | Admitting: Endocrinology

## 2017-11-17 ENCOUNTER — Encounter (HOSPITAL_COMMUNITY): Payer: BLUE CROSS/BLUE SHIELD

## 2017-11-19 ENCOUNTER — Ambulatory Visit
Admission: RE | Admit: 2017-11-19 | Discharge: 2017-11-19 | Disposition: A | Discharge status: Home / Self Care | Payer: BLUE CROSS/BLUE SHIELD | Source: Ambulatory Visit | Attending: Urology | Admitting: Urology

## 2017-11-19 DIAGNOSIS — Z86718 Personal history of other venous thrombosis and embolism: Secondary | ICD-10-CM | POA: Diagnosis not present

## 2017-11-19 DIAGNOSIS — I824Y3 Acute embolism and thrombosis of unspecified deep veins of proximal lower extremity, bilateral: Secondary | ICD-10-CM

## 2017-12-13 DIAGNOSIS — R52 Pain, unspecified: Secondary | ICD-10-CM | POA: Diagnosis not present

## 2017-12-14 ENCOUNTER — Other Ambulatory Visit: Payer: Self-pay | Admitting: Endocrinology

## 2017-12-22 DIAGNOSIS — C61 Malignant neoplasm of prostate: Secondary | ICD-10-CM | POA: Diagnosis not present

## 2017-12-22 DIAGNOSIS — R9721 Rising PSA following treatment for malignant neoplasm of prostate: Secondary | ICD-10-CM | POA: Diagnosis not present

## 2017-12-28 DIAGNOSIS — C61 Malignant neoplasm of prostate: Secondary | ICD-10-CM | POA: Diagnosis not present

## 2017-12-28 DIAGNOSIS — R9721 Rising PSA following treatment for malignant neoplasm of prostate: Secondary | ICD-10-CM | POA: Diagnosis not present

## 2017-12-29 ENCOUNTER — Other Ambulatory Visit: Payer: Self-pay | Admitting: Urology

## 2017-12-29 ENCOUNTER — Other Ambulatory Visit: Payer: Self-pay

## 2017-12-29 ENCOUNTER — Telehealth: Payer: Self-pay | Admitting: Endocrinology

## 2017-12-29 ENCOUNTER — Other Ambulatory Visit: Payer: Self-pay | Admitting: Endocrinology

## 2017-12-29 DIAGNOSIS — C61 Malignant neoplasm of prostate: Secondary | ICD-10-CM

## 2017-12-29 MED ORDER — SEMAGLUTIDE(0.25 OR 0.5MG/DOS) 2 MG/1.5ML ~~LOC~~ SOPN: 0.2500 mg | PEN_INJECTOR | SUBCUTANEOUS | 3 refills | Status: DC

## 2017-12-29 MED ORDER — SEMAGLUTIDE(0.25 OR 0.5MG/DOS) 2 MG/1.5ML ~~LOC~~ SOPN: 0.5000 mg | PEN_INJECTOR | SUBCUTANEOUS | 1 refills | Status: DC

## 2017-12-29 MED ORDER — SEMAGLUTIDE(0.25 OR 0.5MG/DOS) 2 MG/1.5ML ~~LOC~~ SOPN: 0.5000 mg | PEN_INJECTOR | SUBCUTANEOUS | 3 refills | Status: DC

## 2017-12-29 NOTE — Telephone Encounter (Signed)
Send prescription for 0.5 mg weekly on the Ozempic.  However he will use 0.25 mg on the first 2 injections

## 2017-12-29 NOTE — Telephone Encounter (Signed)
Patient stated that you wanted to change him from Trulicity to [place]. He stated that insurance gave approval to cover medication in full and would like to you if you will change his medications now.

## 2017-12-29 NOTE — Telephone Encounter (Signed)
Medication sent to pharmacy per MD request.

## 2017-12-29 NOTE — Telephone Encounter (Signed)
Patient is needing a 90 day supply sent of Ozempic to the pharmacy      [place], [place]

## 2018-01-05 ENCOUNTER — Encounter (HOSPITAL_COMMUNITY)
Admission: RE | Admit: 2018-01-05 | Discharge: 2018-01-05 | Disposition: A | Discharge status: Home / Self Care | Payer: BLUE CROSS/BLUE SHIELD | Source: Ambulatory Visit | Attending: Urology | Admitting: Urology

## 2018-01-05 DIAGNOSIS — C61 Malignant neoplasm of prostate: Secondary | ICD-10-CM | POA: Diagnosis not present

## 2018-01-05 MED ORDER — AXUMIN (FLUCICLOVINE F 18) INJECTION
8.7200 | Freq: Once | INTRAVENOUS | Status: AC
  Administered 2018-01-05: 8.72 via INTRAVENOUS

## 2018-01-28 ENCOUNTER — Other Ambulatory Visit (INDEPENDENT_AMBULATORY_CARE_PROVIDER_SITE_OTHER): Payer: BLUE CROSS/BLUE SHIELD

## 2018-01-28 DIAGNOSIS — E1165 Type 2 diabetes mellitus with hyperglycemia: Secondary | ICD-10-CM | POA: Diagnosis not present

## 2018-01-28 LAB — COMPREHENSIVE METABOLIC PANEL
ALT: 15 U/L (ref 0–53)
AST: 14 U/L (ref 0–37)
Albumin: 4.2 g/dL (ref 3.5–5.2)
Alkaline Phosphatase: 50 U/L (ref 39–117)
BUN: 21 mg/dL (ref 6–23)
CO2: 28 mEq/L (ref 19–32)
Calcium: 9.5 mg/dL (ref 8.4–10.5)
Chloride: 105 mEq/L (ref 96–112)
Creatinine, Ser: 1.31 mg/dL (ref 0.40–1.50)
GFR: 58.3 mL/min — ABNORMAL LOW (ref >60.00)
Glucose, Bld: 124 mg/dL — ABNORMAL HIGH (ref 70–99)
Potassium: 4.9 mEq/L (ref 3.5–5.1)
Sodium: 141 mEq/L (ref 135–145)
Total Bilirubin: 0.7 mg/dL (ref 0.2–1.2)
Total Protein: 7.5 g/dL (ref 6.0–8.3)

## 2018-01-28 LAB — HEMOGLOBIN A1C: Hgb A1c MFr Bld: 7.4 % — ABNORMAL HIGH (ref 4.6–6.5)

## 2018-02-01 NOTE — Progress Notes (Signed)
Patient ID: Tommy Yang, male   DOB: Jan 18, 1951, 67 y.o.   MRN: [id number]           Reason for Appointment: Follow-up for Type 2 Diabetes   History of Present Illness:          Diagnosis: Type 2 diabetes mellitus, date of diagnosis: 05/2013       Past history:   He apparently had significant hyperglycemia in 2014 when seen in the urgent care center but did not establish with a PCP for control. He was started on treatment for his diabetes in 10/2013 and he was initially treated with metformin and Januvia A few months later he was also given glipizide ER to help his control when his A1c had gone up to 10.9 He says he was also seen by dietitian but no record available of this. Subsequently his blood sugar control had improved with A1c down to 6.8 in 9/15 On his consultation in 3/16 he had high blood sugars and weight gain; A1c was 9.1% with taking glipizide ER, Januvia and metformin.  Recent history:    Non-insulin hypoglycemic drugs the patient is taking are: Glipizide ER 2.5 mg in am, Invokamet 150/1000, 2 tablets daily, Ozempic 0.25 mg weekly   His A1c has been as low as 6.1 but now is up to 7.4  Current blood sugar patterns and problems identified:  He still has not started an exercise program even though he is able to walk without difficulty usually  Also has gained a little weight and he blames this on prostate cancer treatment  Although he thinks his diet is excellent with small amounts of meats and fats avoiding white bread he still tends to have high readings periodically  He thinks that his blood sugars may spike up in the evening when he uses Flonase and he does not do so when he does not use it  He was told to switch to Ozempic but he only recently finished his Trulicity and has taken only one injection of the Ozempic 0.25 mg last week  Sugars in the mornings are also mostly high though variable, not clear if his meter had the right time programmed  Side effects  from medications have been: None  Compliance with the medical regimen: Fair  Hypoglycemia: None    Glucose monitoring:  done 1-2 times a day         Glucometer: One Touch.      Blood Glucose readings by analysis of monitor download   Mean values apply above for all meters except median for One Touch  PRE-MEAL Fasting Lunch Dinner Bedtime Overall  Glucose range:  101-165   90-179    Mean/median:  140  131   140   POST-MEAL PC Breakfast PC Lunch PC Dinner  Glucose range:    141-213  Mean/median:    173   PREVIOUS readings the bottom  Mean values apply above for all meters except median for One Touch  PRE-MEAL Fasting Lunch Dinner Bedtime Overall  Glucose range: 117-156     110-1 75   Mean/median:  126    137   POST-MEAL PC Breakfast PC Lunch PC Dinner  Glucose range: 143-171   118, 156   Mean/median:  140      Self-care: The diet that the patient has been following is: tries to limit  fats .     Meals: 3 meals per day. Breakfast is sometimes cereal,sausage  Fruits for snacks  Exercise: walking very little  Dietician visit, most recent:?  2015 .               Weight history:    Wt Readings from Last 3 Encounters:  02/02/18 (!) 316 lb 3.2 oz (143.4 kg)  11/04/17 (!) 314 lb 3.2 oz (142.5 kg)  10/05/17 292 lb (132.5 kg)    Glycemic control:   Lab Results  Component Value Date   HGBA1C 7.4 (H) 01/28/2018   HGBA1C 7.2 (H) 10/29/2017   HGBA1C 7.1 (H) 08/03/2017   Lab Results  Component Value Date   MICROALBUR 4.6 (H) 08/03/2017   LDLCALC 92 10/29/2017   CREATININE 1.31 01/28/2018    Lab on 01/28/2018  Component Date Value Ref Range Status  . Sodium 01/28/2018 141  135 - 145 mEq/L Final  . Potassium 01/28/2018 4.9  3.5 - 5.1 mEq/L Final  . Chloride 01/28/2018 105  96 - 112 mEq/L Final  . CO2 01/28/2018 28  19 - 32 mEq/L Final  . Glucose, Bld 01/28/2018 124* 70 - 99 mg/dL Final  . BUN 01/28/2018 21  6 - 23 mg/dL Final  . Creatinine, Ser 01/28/2018 1.31  0.40  - 1.50 mg/dL Final  . Total Bilirubin 01/28/2018 0.7  0.2 - 1.2 mg/dL Final  . Alkaline Phosphatase 01/28/2018 50  39 - 117 U/L Final  . AST 01/28/2018 14  0 - 37 U/L Final  . ALT 01/28/2018 15  0 - 53 U/L Final  . Total Protein 01/28/2018 7.5  6.0 - 8.3 g/dL Final  . Albumin 01/28/2018 4.2  3.5 - 5.2 g/dL Final  . Calcium 01/28/2018 9.5  8.4 - 10.5 mg/dL Final  . GFR 01/28/2018 58.30* >60.00 mL/min Final  . Hgb A1c MFr Bld 01/28/2018 7.4* 4.6 - 6.5 % Final   Glycemic Control Guidelines for People with Diabetes:Non Diabetic:  <6%Goal of Therapy: <7%Additional Action Suggested:  >8%       Allergies as of 02/02/2018   No Known Allergies     Medication List        Accurate as of 02/02/18  8:16 AM. Always use your most recent med list.          amLODipine 2.5 MG tablet Commonly known as:  NORVASC Take 1 tablet (2.5 mg total) by mouth daily.   atorvastatin 40 MG tablet Commonly known as:  LIPITOR TAKE ONE TABLET BY MOUTH ONCE DAILY.   blood glucose meter kit and supplies Kit Test blood sugar daily as directed. Dx code: E11.9   CINNAMON PO Take 1,000 mg by mouth daily.   Fish Oil 1000 MG Caps Take 1,000 mg by mouth 2 (two) times daily.   glipiZIDE 2.5 MG 24 hr tablet Commonly known as:  GLUCOTROL XL TAKE 1 TABLET BY MOUTH ONCE DAILY WITH BREAKFAST   INVOKAMET XR [phone] MG Tb24 Generic drug:  Canagliflozin-metFORMIN HCl ER TAKE 2 TABLETS DAILY WITH BREAKFAST   losartan 100 MG tablet Commonly known as:  COZAAR Take 1 tablet (100 mg total) by mouth daily.   metoprolol tartrate 25 MG tablet Commonly known as:  LOPRESSOR Take 1 tablet (25 mg total) by mouth 2 (two) times daily.   omeprazole 40 MG capsule Commonly known as:  PRILOSEC One tablet daily   ONE TOUCH ULTRA TEST test strip Generic drug:  glucose blood CHECK TWICE A DAY   rivaroxaban 20 MG Tabs tablet Commonly known as:  XARELTO TAKE ONE TABLET BY MOUTH ONCE DAILY WITH SUPPER   Semaglutide 0.25 or  0.5 MG/DOSE  Sopn Commonly known as:  OZEMPIC Inject 0.5 mg into the skin once a week. Inject 0.82m once weekly for the first two weeks, and then increase to 0.534mweekly.   VASCEPA 1 g Caps Generic drug:  Icosapent Ethyl TAKE 2 CAPSULES BY MOUTH TWICE DAILY   Vitamin D (Cholecalciferol) 400 units Tabs Take 400 Units by mouth daily.       Allergies: No Known Allergies  Past Medical History:  Diagnosis Date  . [place] mellitus without complication [place]  . DVT (deep venous thrombosis) ([facility])    left leg.  . Marland KitchenERD (gastroesophageal reflux disease)   . Hypercholesterolemia   . Hypertension   . Prostate cancer ([facility]   gleason 4+3=7, 6/12 cores positive. seed implant and radiation    Past Surgical History:  Procedure Laterality Date  . APPENDECTOMY    . BALLOON DILATION N/A 08/02/2015   Procedure: BALLOON DILATION;  Surgeon: DaMilus BanisterMD;  [facility];  Service: Endoscopy;  Laterality: N/A;  . COLONOSCOPY WITH PROPOFOL N/A 08/02/2015   Procedure: COLONOSCOPY WITH PROPOFOL w/ APC;  Surgeon: DaMilus BanisterMD;  [facility];  Service: Endoscopy;  Laterality: N/A;  . ESOPHAGOGASTRODUODENOSCOPY (EGD) WITH PROPOFOL N/A 08/02/2015   Procedure: ESOPHAGOGASTRODUODENOSCOPY (EGD) WITH PROPOFOL/ possible dilation.;  Surgeon: DaMilus BanisterMD;  Location: [facility];  Service: Endoscopy;  Laterality: N/A;  . KNEE SURGERY     right knee orthoscopic  . PROSTATE BIOPSY  12/06/13   Gleason 4+3=7, volume 35 gm  . [facility]. TONSILLECTOMY      Family History  Problem Relation Age of Onset  . Heart disease Mother        before age 65. Diabetes Mother   . Hyperlipidemia Mother   . Varicose Veins Mother   . Heart disease Father   . Diabetes Father   . Cancer Neg Hx     Social History:  reports that he quit smoking about 5 years ago. His smoking use included cigarettes. He has a 11.50 pack-year smoking  history. He has never used smokeless tobacco. He reports that he drinks alcohol. He reports that he does not use drugs.    Review of Systems        Lipids: he has had significant hyperlipidemia and has been treated with Lipitor 40 mg daily and is also taking Vascepa for triglycerides Has no history of CAD His diet is relatively low in fat  Continues to have low HDL Triglycerides are slightly better as of 2/19 along with LDL which is now below 100       Lab Results  Component Value Date   CHOL 153 10/29/2017   CHOL 167 08/03/2017   CHOL 149 12/30/2016   Lab Results  Component Value Date   HDL 30.40 (L) 10/29/2017   HDL 32.00 (L) 08/03/2017   HDL 29.70 (L) 12/30/2016   Lab Results  Component Value Date   LDLCALC 92 10/29/2017   LDLCALC 102 (H) 08/03/2017   LDLCALC 97 04/23/2016   Lab Results  Component Value Date   TRIG 152.0 (H) 10/29/2017   TRIG 164.0 (H) 08/03/2017   TRIG 204.0 (H) 12/30/2016   Lab Results  Component Value Date   CHOLHDL 5 10/29/2017   CHOLHDL 5 08/03/2017   CHOLHDL 5 12/30/2016   Lab Results  Component Value Date   LDLDIRECT 93.0 12/30/2016   LDLDIRECT 103.0 02/08/2015  The blood pressure has been treated with losartan 100 mg and low-dose metoprolol, followed by PCP and cardiologist   BP Readings from Last 3 Encounters:  02/02/18 130/72  11/04/17 120/68  10/05/17 138/70      LABS:  Lab on 01/28/2018  Component Date Value Ref Range Status  . Sodium 01/28/2018 141  135 - 145 mEq/L Final  . Potassium 01/28/2018 4.9  3.5 - 5.1 mEq/L Final  . Chloride 01/28/2018 105  96 - 112 mEq/L Final  . CO2 01/28/2018 28  19 - 32 mEq/L Final  . Glucose, Bld 01/28/2018 124* 70 - 99 mg/dL Final  . BUN 01/28/2018 21  6 - 23 mg/dL Final  . Creatinine, Ser 01/28/2018 1.31  0.40 - 1.50 mg/dL Final  . Total Bilirubin 01/28/2018 0.7  0.2 - 1.2 mg/dL Final  . Alkaline Phosphatase 01/28/2018 50  39 - 117 U/L Final  . AST 01/28/2018 14   0 - 37 U/L Final  . ALT 01/28/2018 15  0 - 53 U/L Final  . Total Protein 01/28/2018 7.5  6.0 - 8.3 g/dL Final  . Albumin 01/28/2018 4.2  3.5 - 5.2 g/dL Final  . Calcium 01/28/2018 9.5  8.4 - 10.5 mg/dL Final  . GFR 01/28/2018 58.30* >60.00 mL/min Final  . Hgb A1c MFr Bld 01/28/2018 7.4* 4.6 - 6.5 % Final   Glycemic Control Guidelines for People with Diabetes:Non Diabetic:  <6%Goal of Therapy: <7%Additional Action Suggested:  >8%     Physical Examination:  BP 130/72 (BP Location: Left Arm, Patient Position: Sitting, Cuff Size: Normal)   Pulse 85   Ht '5\' 10"'  (1.778 m)   Wt (!) 316 lb 3.2 oz (143.4 kg)   SpO2 94%   BMI 45.37 kg/m   Diabetes type 2, uncontrolled   See history of present illness for detailed discussion of his current management, blood sugar patterns and problems identified    Blood sugars are mildly increased with A1c going to 7.4 Although he thinks his high sugars are related to using Flonase in the evening he still has some high readings at other times including fasting He does not program as discussed on each visit even though he can do so without difficulty His diet is fairly good but needs weight loss  He is just starting Ozempic and hopefully will do better with this compared to Trulicity   HYPERTENSION: Blood pressure is well controlled     PLAN:  Encouraged him to start a walking program He will go up to 0.5 mg of the Ozempic now and showed him how to do this He will call in a month if he is still having difficulty with high sugars and will increase Ozempic to 1 mg  There are no Patient Instructions on file for this visit.      Elayne Snare 02/02/2018, 8:16 AM   Note: This office note was prepared with Dragon voice recognition system technology. Any transcriptional errors that result from this process are unintentional.

## 2018-02-02 ENCOUNTER — Other Ambulatory Visit: Payer: Self-pay

## 2018-02-02 ENCOUNTER — Encounter: Payer: Self-pay | Admitting: Endocrinology

## 2018-02-02 ENCOUNTER — Ambulatory Visit: Payer: BLUE CROSS/BLUE SHIELD | Admitting: Endocrinology

## 2018-02-02 VITALS — BP 130/72 | HR 85 | Ht 70.0 in | Wt 316.2 lb

## 2018-02-02 DIAGNOSIS — E1165 Type 2 diabetes mellitus with hyperglycemia: Secondary | ICD-10-CM | POA: Diagnosis not present

## 2018-02-02 MED ORDER — ICOSAPENT ETHYL 1 G PO CAPS: 2.0000 | ORAL_CAPSULE | Freq: Two times a day (BID) | ORAL | 2 refills | Status: DC

## 2018-02-02 NOTE — Patient Instructions (Addendum)
Try 0.5mg  weekly Ozempic  Start walking daily  Call if sugar stays > 200

## 2018-02-28 ENCOUNTER — Other Ambulatory Visit: Payer: Self-pay | Admitting: Endocrinology

## 2018-03-08 DIAGNOSIS — C61 Malignant neoplasm of prostate: Secondary | ICD-10-CM | POA: Diagnosis not present

## 2018-03-15 DIAGNOSIS — C61 Malignant neoplasm of prostate: Secondary | ICD-10-CM | POA: Diagnosis not present

## 2018-03-15 DIAGNOSIS — C778 Secondary and unspecified malignant neoplasm of lymph nodes of multiple regions: Secondary | ICD-10-CM | POA: Diagnosis not present

## 2018-03-15 DIAGNOSIS — R9721 Rising PSA following treatment for malignant neoplasm of prostate: Secondary | ICD-10-CM | POA: Diagnosis not present

## 2018-03-25 DIAGNOSIS — C61 Malignant neoplasm of prostate: Secondary | ICD-10-CM | POA: Diagnosis not present

## 2018-03-25 DIAGNOSIS — Z5111 Encounter for antineoplastic chemotherapy: Secondary | ICD-10-CM | POA: Diagnosis not present

## 2018-03-29 DIAGNOSIS — R3915 Urgency of urination: Secondary | ICD-10-CM | POA: Diagnosis not present

## 2018-04-16 ENCOUNTER — Other Ambulatory Visit: Payer: Self-pay

## 2018-04-16 ENCOUNTER — Encounter: Payer: Self-pay | Admitting: Family Medicine

## 2018-04-16 ENCOUNTER — Ambulatory Visit: Payer: BLUE CROSS/BLUE SHIELD | Admitting: Family Medicine

## 2018-04-16 VITALS — BP 132/78 | HR 87 | Temp 98.4°F | Ht 71.0 in | Wt 322.0 lb

## 2018-04-16 DIAGNOSIS — R0600 Dyspnea, unspecified: Secondary | ICD-10-CM | POA: Diagnosis not present

## 2018-04-16 DIAGNOSIS — I4891 Unspecified atrial fibrillation: Secondary | ICD-10-CM

## 2018-04-16 DIAGNOSIS — R609 Edema, unspecified: Secondary | ICD-10-CM

## 2018-04-16 DIAGNOSIS — Z79818 Long term (current) use of other agents affecting estrogen receptors and estrogen levels: Secondary | ICD-10-CM | POA: Diagnosis not present

## 2018-04-16 DIAGNOSIS — R600 Localized edema: Secondary | ICD-10-CM

## 2018-04-16 MED ORDER — FUROSEMIDE 20 MG PO TABS: 20.0000 mg | ORAL_TABLET | Freq: Every day | ORAL | 0 refills | Status: DC

## 2018-04-16 NOTE — Progress Notes (Signed)
Subjective:  By signing my name below, I, Essence Howell, attest that this documentation has been prepared under the direction and in the presence of Wendie Agreste, MD Electronically Signed: Ladene Artist, ED Scribe 04/16/2018 at 10:33 AM.   Patient ID: Tommy Yang, male    DOB: [birth year], 67 y.o.   MRN: [id number]  Chief Complaint  Patient presents with  . Medication Problem    Lupron, injection-first dose recieve 03/23/2018 (lower leg swelling and tingling)   HPI Tommy Yang is a 67 y.o. male who presents to Primary Care at [facility] complaining of LE swelling. Complicated pmhx including prostate CA, h/o DVT managed with anticoagulation on xarleto, DM, obesity, a-fib. Note from [facility] 7/15 by Dr. Dayna Ramus, PSA up to 2.62 on 7/8 with recurrence/metastatic prostate CA. Decision to start androgen depo provera therapy and Xtani. Followed by endo Dr. Dwyane Dee. Cardiologist Dr. Claiborne Billings.  Pt states that he received his first Lupron injection towards the end of July. He noticed leg swelling and tingling in his feet within 2-3 days of receiving the injection. Has also noticed occasional sob since the injection. States he was started on Flomax and Mobic for inflammation after prostate biopsy which he took for 4-5 days. Notes that Mobic improved urination symptoms and leg swelling. Pt took a dose of Mobic yesterday and the day prior. Denies cp, chest tightness. Pt has also taken Lasix in the past for LE swelling. Potassium was 4.9 2 months ago. Last saw Dr. Claiborne Billings over a yr ago.  Patient Active Problem List   Diagnosis Date Noted  . Microalbuminuria 04/23/2016  . PAF (paroxysmal atrial fibrillation) ([place]) 12/29/2015  . Rectal bleeding 07/18/2015  . Dysphagia 07/18/2015  . Chronic anticoagulation 03/14/2015  . Morbid obesity ([place]) 03/14/2015  . Exertional shortness of breath 01/12/2015  . Abnormal ECG 01/12/2015  . Encounter for screening colonoscopy 05/09/2014  . Esophageal  reflux 05/09/2014  . Other dysphagia 05/09/2014  . Evaluate for OSA (obstructive sleep apnea) 02/28/2014  . Hyperlipidemia 02/28/2014  . Prostate cancer ([place]) 12/14/2013  . PSA elevation 10/26/2013  . Erectile dysfunction 10/26/2013  . Type II diabetes mellitus, uncontrolled ([place]) 10/19/2013  . DVT (deep venous thrombosis) ([place])   . Left leg DVT ([place]) 09/28/2013  . Leg edema, left 09/28/2013   Past Medical History:  Diagnosis Date  . Diabetes mellitus without complication ([place])   . DVT (deep venous thrombosis) ([facility])    left leg.  [name] GERD (gastroesophageal reflux disease)   . Hypercholesterolemia   . Hypertension   . Prostate cancer ([place]) 12/06/13   gleason 4+3=7, 6/12 cores positive. seed implant and radiation   Past Surgical History:  Procedure Laterality Date  . APPENDECTOMY    . BALLOON DILATION N/A 08/02/2015   Procedure: BALLOON DILATION;  Surgeon: [name], MD;  Location: [facility];  Service: Endoscopy;  Laterality: N/A;  . COLONOSCOPY WITH PROPOFOL N/A 08/02/2015   Procedure: COLONOSCOPY WITH PROPOFOL w/ APC;  Surgeon: [name], MD;  Location: [facility];  Service: Endoscopy;  Laterality: N/A;  . ESOPHAGOGASTRODUODENOSCOPY (EGD) WITH PROPOFOL N/A 08/02/2015   Procedure: ESOPHAGOGASTRODUODENOSCOPY (EGD) WITH PROPOFOL/ possible dilation.;  Surgeon: [name], MD;  Location: [facility];  Service: Endoscopy;  Laterality: N/A;  . KNEE SURGERY     right knee orthoscopic  . PROSTATE BIOPSY  12/06/13   Gleason 4+3=7, volume 35 gm  . [place]  . TONSILLECTOMY  No Known Allergies Prior to Admission medications   Medication Sig Start Date End Date Taking? Authorizing Provider  amLODipine (NORVASC) 2.5 MG tablet Take 1 tablet (2.5 mg total) by mouth daily. 10/05/17   Wendie Agreste, MD  atorvastatin (LIPITOR) 40 MG tablet TAKE ONE TABLET BY MOUTH ONCE DAILY. 10/05/17   Wendie Agreste, MD  blood glucose meter kit  and supplies KIT Test blood sugar daily as directed. Dx code: E11.9 01/23/15   Darlyne Russian, MD  CINNAMON PO Take 1,000 mg by mouth daily.    [provider]  glipiZIDE (GLUCOTROL XL) 2.5 MG 24 hr tablet TAKE 1 TABLET BY MOUTH ONCE DAILY WITH BREAKFAST 07/01/17   Elayne Snare, MD  Icosapent Ethyl (VASCEPA) 1 g CAPS Take 2 capsules (2 g total) by mouth 2 (two) times daily. 02/02/18   Elayne Snare, MD  INVOKAMET XR [phone] MG TB24 TAKE 2 TABLETS DAILY WITH BREAKFAST 12/14/17   Elayne Snare, MD  losartan (COZAAR) 100 MG tablet Take 1 tablet (100 mg total) by mouth daily. 10/05/17   Wendie Agreste, MD  metoprolol tartrate (LOPRESSOR) 25 MG tablet Take 1 tablet (25 mg total) by mouth 2 (two) times daily. 10/05/17   Wendie Agreste, MD  Omega-3 Fatty Acids (FISH OIL) 1000 MG CAPS Take 1,000 mg by mouth 2 (two) times daily.    [provider]  omeprazole (PRILOSEC) 40 MG capsule One tablet daily 05/12/17   Wendie Agreste, MD  ONE [facility] ULTRA TEST test strip CHECK TWICE A DAY 03/01/18   Elayne Snare, MD  rivaroxaban (XARELTO) 20 MG TABS tablet TAKE ONE TABLET BY MOUTH ONCE DAILY WITH SUPPER 10/05/17   Wendie Agreste, MD  Semaglutide (OZEMPIC) 0.25 or 0.5 MG/DOSE SOPN Inject 0.5 mg into the skin once a week. Inject 0.63m once weekly for the first two weeks, and then increase to 0.570mweekly. 12/29/17   KuElayne SnareMD  Vitamin D, Cholecalciferol, 400 UNITS TABS Take 400 Units by mouth daily.    [provider]   Social History   Socioeconomic History  . Marital status: Married    Spouse name: Not on file  . Number of children: 1  . Years of education: Not on file  . Highest education level: Not on file  Occupational History    Employer: RETIRED  Social Needs  . Financial resource strain: Not on file  . Food insecurity:    Worry: Not on file    Inability: Not on file  . Transportation needs:    Medical: Not on file    Non-medical: Not on file  Tobacco Use  . Smoking  status: Former Smoker    Packs/day: 0.50    Years: 23.00    Pack years: 11.50    Types: Cigarettes    Last attempt to quit: 08/01/2012    Years since quitting: 5.7  . Smokeless tobacco: Never Used  Substance and Sexual Activity  . Alcohol use: Yes    Alcohol/week: 0.0 standard drinks    Comment: very seldom, maybe 3x per year  . Drug use: No  . Sexual activity: Yes    Birth control/protection: Abstinence  Lifestyle  . Physical activity:    Days per week: Not on file    Minutes per session: Not on file  . Stress: Not on file  Relationships  . Social connections:    Talks on phone: Not on file    Gets together: Not on file  Attends religious service: Not on file    Active member of club or organization: Not on file    Attends meetings of clubs or organizations: Not on file    Relationship status: Not on file  . Intimate partner violence:    Fear of current or ex partner: Not on file    Emotionally abused: Not on file    Physically abused: Not on file    Forced sexual activity: Not on file  Other Topics Concern  . 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 Negative for chest tightness.   Cardiovascular: Positive for leg swelling. Negative for chest pain.      Objective:   Physical Exam  Constitutional: He is oriented to person, place, and time. He appears well-developed and well-nourished.  HENT:  Head: Normocephalic and atraumatic.  Eyes: Pupils are equal, round, and reactive to light. EOM are normal.  Neck: No JVD present. Carotid bruit is not present.  Cardiovascular: Normal rate. An irregular rhythm present. Exam reveals distant heart sounds.  No murmur  heard. Pulmonary/Chest: Effort normal and breath sounds normal. He has no rales.  Musculoskeletal: He exhibits edema.  LE: tight edema bilaterally that extends to proximal tibia. 1-2+ pitting. Some slight darkening and stasis changes without apparent ulceration.  Neurological: He is alert and oriented to person, place, and time.  Skin: Skin is warm and dry.  Psychiatric: He has a normal mood and affect.  Vitals reviewed.  Vitals:   04/16/18 1002 04/16/18 1008  BP: (!) 145/85 132/78  Pulse: 87   Temp: 98.4 F (36.9 C)   TempSrc: Oral   SpO2: 92%   Weight: (!) 322 lb (146.1 kg)   Height: '5\' 11"'  (1.803 m)       Assessment & Plan:    Tommy Yang is a 67 y.o. male Peripheral edema - Plan: Basic metabolic panel, Pro b natriuretic peptide, CBC, furosemide (LASIX) 20 MG tablet  Dyspnea, unspecified type - Plan: Pro b natriuretic peptide  Use of leuprolide acetate (Lupron)  Atrial fibrillation, unspecified type (HCC)  History of chronic anticoagulation for atrial fibrillation, history of DVT, prostate cancer.  Recent start of Lupron injection for prostate cancer with subsequent peripheral edema.  Does note episodic dyspnea.  No apparent rales on exam.   -Initially check BNP, start Lasix 20 mg daily, potassium rich foods discussed with likely recheck electrolytes at follow-up visit.  Handout given on peripheral edema.  Recommended cardiology follow-up.  -Labs returned, BNP elevated, suspected CHF component.  Advised to return as soon as possible to discuss further testing/treatment.   Meds ordered this encounter  Medications  . furosemide (LASIX) 20 MG tablet    Sig: Take 1 tablet (20 mg total) by mouth daily.    Dispense:  5 tablet    Refill:  0   Patient Instructions   Try lasix once per day for now, sure to eat potassium rich foods such as banana every day while you are taking the Lasix and I will recheck your potassium level next visit.  See information on swelling  below.  That may be due to your recent injection.  Recheck in 5 days.   I will check some other tests as well.    If any new shortness of breath or chest pain - go to emergency room.  I would still recommend calling your cardiologist for follow-up in the next few weeks.  If you have lab work done today you will be contacted with your lab results within the next 2 weeks.  If you have not heard from [name] then please contact us. The fastest way to get your results is to register for My Chart.   Peripheral Edema Peripheral edema is swelling that is caused by a buildup of fluid. Peripheral edema most often affects the lower legs, ankles, and feet. It can also develop in the arms, hands, and face. The area of the body that has peripheral edema will look swollen. It may also feel heavy or warm. Your clothes may start to feel tight. Pressing on the area may make a temporary dent in your skin. You may not be able to move your arm or leg as much as usual. There are many causes of peripheral edema. It can be a complication of other diseases, such as congestive heart failure, kidney disease, or a problem with your blood circulation. It also can be a side effect of certain medicines. It often happens to women during pregnancy. Sometimes, the cause is not known. Treating the underlying condition is often the only treatment for peripheral edema. Follow these instructions at home: Pay attention to any changes in your symptoms. Take these actions to help with your discomfort:  Raise (elevate) your legs while you are sitting or lying down.  Move around often to prevent stiffness and to lessen swelling. Do not sit or stand for long periods of time.  Wear support stockings as told by your health care provider.  Follow instructions from your health care provider about limiting salt (sodium) in your diet. Sometimes eating less salt can reduce swelling.  Take over-the-counter and prescription medicines only as told by  your health care provider. Your health care provider may prescribe medicine to help your body get rid of excess water (diuretic).  Keep all follow-up visits as told by your health care provider. This is important.  Contact a health care provider if:  You have a fever.  Your edema starts suddenly or is getting worse, especially if you are pregnant or have a medical condition.  You have swelling in only one leg.  You have increased swelling and pain in your legs. Get help right away if:  You develop shortness of breath, especially when you are lying down.  You have pain in your chest or abdomen.  You feel weak.  You faint. This information is not intended to replace advice given to you by your health care provider. Make sure you discuss any questions you have with your health care provider. Document Released: 09/25/2004 Document Revised: 01/21/2016 Document Reviewed: 02/28/2015 Elsevier Interactive Patient Education  2018 [employer].   IF you received an x-ray today, you will receive an invoice from [facility]. Please contact [facility] at [phone] with questions or concerns regarding your invoice.   IF you received labwork today, you will receive an invoice from [facility]. Please contact [facility] at [phone] with questions or concerns regarding your invoice.   Our billing staff will not be able to assist you with questions regarding bills from these companies.  You will be contacted with the lab results as soon as they are available. The fastest way  to get your results is to activate your My Chart account. Instructions are located on the last page of this paperwork. If you have not heard from [name] regarding the results in 2 weeks, please contact this office.       I personally performed the services described in this documentation, which was scribed in my presence. The recorded information has been reviewed and considered for accuracy and  completeness, addended by me as needed, and agree with information above.  Signed,   Merri Ray, MD Primary [facility] at [facility].  04/20/18 11:20 AM

## 2018-04-16 NOTE — Patient Instructions (Addendum)
Try lasix once per day for now, sure to eat potassium rich foods such as banana every day while you are taking the Lasix and I will recheck your potassium level next visit.  See information on swelling below.  That may be due to your recent injection.  Recheck in 5 days.   I will check some other tests as well.    If any new shortness of breath or chest pain - go to emergency room.  I would still recommend calling your cardiologist for follow-up in the next few weeks.  If you have lab work done today you will be contacted with your lab results within the next 2 weeks.  If you have not heard from [name] then please contact us. The fastest way to get your results is to register for My Chart.   Peripheral Edema Peripheral edema is swelling that is caused by a buildup of fluid. Peripheral edema most often affects the lower legs, ankles, and feet. It can also develop in the arms, hands, and face. The area of the body that has peripheral edema will look swollen. It may also feel heavy or warm. Your clothes may start to feel tight. Pressing on the area may make a temporary dent in your skin. You may not be able to move your arm or leg as much as usual. There are many causes of peripheral edema. It can be a complication of other diseases, such as congestive heart failure, kidney disease, or a problem with your blood circulation. It also can be a side effect of certain medicines. It often happens to women during pregnancy. Sometimes, the cause is not known. Treating the underlying condition is often the only treatment for peripheral edema. Follow these instructions at home: Pay attention to any changes in your symptoms. Take these actions to help with your discomfort:  Raise (elevate) your legs while you are sitting or lying down.  Move around often to prevent stiffness and to lessen swelling. Do not sit or stand for long periods of time.  Wear support stockings as told by your health care provider.  Follow  instructions from your health care provider about limiting salt (sodium) in your diet. Sometimes eating less salt can reduce swelling.  Take over-the-counter and prescription medicines only as told by your health care provider. Your health care provider may prescribe medicine to help your body get rid of excess water (diuretic).  Keep all follow-up visits as told by your health care provider. This is important.  Contact a health care provider if:  You have a fever.  Your edema starts suddenly or is getting worse, especially if you are pregnant or have a medical condition.  You have swelling in only one leg.  You have increased swelling and pain in your legs. Get help right away if:  You develop shortness of breath, especially when you are lying down.  You have pain in your chest or abdomen.  You feel weak.  You faint. This information is not intended to replace advice given to you by your health care provider. Make sure you discuss any questions you have with your health care provider. Document Released: 09/25/2004 Document Revised: 01/21/2016 Document Reviewed: 02/28/2015 Elsevier Interactive Patient Education  2018 [employer].   IF you received an x-ray today, you will receive an invoice from [facility]. Please contact [facility] at [phone] with questions or concerns regarding your invoice.   IF you received labwork today, you will receive an invoice from [employer] [facility].  Please contact [facility] at [phone] with questions or concerns regarding your invoice.   Our billing staff will not be able to assist you with questions regarding bills from these companies.  You will be contacted with the lab results as soon as they are available. The fastest way to get your results is to activate your My Chart account. Instructions are located on the last page of this paperwork. If you have not heard from [name] regarding the results in 2 weeks, please contact this  office.

## 2018-04-17 LAB — PRO B NATRIURETIC PEPTIDE: NT-Pro BNP: 1322 pg/mL — ABNORMAL HIGH (ref 0–376)

## 2018-04-17 LAB — CBC
Hematocrit: 44.5 % (ref 37.5–51.0)
Hemoglobin: 14.7 g/dL (ref 13.0–17.7)
MCH: 27.6 pg (ref 26.6–33.0)
MCHC: 33 g/dL (ref 31.5–35.7)
MCV: 84 fL (ref 79–97)
Platelets: 216 10*3/uL (ref 150–450)
RBC: 5.32 x10E6/uL (ref 4.14–5.80)
RDW: 17.1 % — ABNORMAL HIGH (ref 12.3–15.4)
WBC: 8.6 10*3/uL (ref 3.4–10.8)

## 2018-04-17 LAB — BASIC METABOLIC PANEL
BUN/Creatinine Ratio: 18 (ref 10–24)
BUN: 22 mg/dL (ref 8–27)
CO2: 20 mmol/L (ref 20–29)
Calcium: 9.3 mg/dL (ref 8.6–10.2)
Chloride: 107 mmol/L — ABNORMAL HIGH (ref 96–106)
Creatinine, Ser: 1.23 mg/dL (ref 0.76–1.27)
GFR calc Af Amer: 71 mL/min/{1.73_m2} (ref >59)
GFR calc non Af Amer: 61 mL/min/{1.73_m2} (ref >59)
Glucose: 105 mg/dL — ABNORMAL HIGH (ref 65–99)
Potassium: 4.9 mmol/L (ref 3.5–5.2)
Sodium: 142 mmol/L (ref 134–144)

## 2018-04-19 ENCOUNTER — Ambulatory Visit: Payer: BLUE CROSS/BLUE SHIELD | Admitting: Podiatry

## 2018-04-20 ENCOUNTER — Encounter: Payer: Self-pay | Admitting: Family Medicine

## 2018-04-20 ENCOUNTER — Other Ambulatory Visit: Payer: Self-pay

## 2018-04-20 ENCOUNTER — Telehealth: Payer: Self-pay | Admitting: *Deleted

## 2018-04-20 ENCOUNTER — Ambulatory Visit (INDEPENDENT_AMBULATORY_CARE_PROVIDER_SITE_OTHER): Payer: BLUE CROSS/BLUE SHIELD | Admitting: Family Medicine

## 2018-04-20 VITALS — BP 123/80 | HR 101 | Temp 98.2°F | Ht 71.0 in | Wt 317.0 lb

## 2018-04-20 DIAGNOSIS — I509 Heart failure, unspecified: Secondary | ICD-10-CM

## 2018-04-20 DIAGNOSIS — R609 Edema, unspecified: Secondary | ICD-10-CM | POA: Diagnosis not present

## 2018-04-20 DIAGNOSIS — R7989 Other specified abnormal findings of blood chemistry: Secondary | ICD-10-CM | POA: Diagnosis not present

## 2018-04-20 DIAGNOSIS — I10 Essential (primary) hypertension: Secondary | ICD-10-CM

## 2018-04-20 DIAGNOSIS — I4891 Unspecified atrial fibrillation: Secondary | ICD-10-CM

## 2018-04-20 MED ORDER — METOPROLOL TARTRATE 50 MG PO TABS: 50.0000 mg | ORAL_TABLET | Freq: Two times a day (BID) | ORAL | 1 refills | Status: DC

## 2018-04-20 MED ORDER — FUROSEMIDE 20 MG PO TABS: 20.0000 mg | ORAL_TABLET | Freq: Every day | ORAL | 0 refills | Status: DC

## 2018-04-20 NOTE — Telephone Encounter (Signed)
Dr Oval Linsey received call from Dr Carlota Raspberry regarding patient. After discussion Dr Oval Linsey recommended patient following up in [facility] clinic. Message sent to scheduling to arrange appointment.

## 2018-04-20 NOTE — Progress Notes (Signed)
Subjective:  By signing my name below, I, Essence Howell, attest that this documentation has been prepared under the direction and in the presence of Wendie Agreste, MD Electronically Signed: Ladene Artist, ED Scribe 04/20/2018 at 11:30 AM.   Patient ID: Tommy Yang, male    DOB: 02-08-1951, 67 y.o.   MRN: [id number]  Chief Complaint  Patient presents with  . Congestive Heart Failure    f/u    HPI Tommy Yang is a 67 y.o. male who presents to Primary Care at [facility] for f/u. Last seen 8/16. He had noticed swelling and tingling in feet and LEs 2-3 days after receiving Lupron injection. Also had occasional dyspnea since injection. He had taken Mobic up until the day prior to last OV. Pt had been treated with Lasix in the past for LE swelling. Last visit with cardiology was 12/2015. Prev echo in 2016, EF 55-60%, normal wall motion. Did have episode of sob with a-fib 08/2015 that reverted to sinus rhythm. Did have some diastolic dysfunction on prev echo, poss related to exertional dyspnea at that time. Plan for sleep study for poss OSA then f/u with cardiology after 4 months. Does not appear he's had that study done. Takes xarelto for anticoagulation for a-fib. Elevated BNP last visit, asked to return further to discuss poss CHF cause of edema. - Pt took last dose of Lasix today. Pt states that he found 65 yr old compression stockings which seemed to improve swelling along with Lasix by 1/3. Reports tingling in his LEs has improved as well. Pt has not taken Mobic within the past 4 days. Reports he only has sob with standing quickly or starting activity but none with continuing activity or ambulating. Pt also states that he purchased an under desk foot pedal which he uses at work but has not had any sob with using this. Denies cp, chest tightness, heart palpitations.  Results for orders placed or performed in visit on 95/63/87  Basic metabolic panel  Result Value Ref Range   Glucose 105  (H) 65 - 99 mg/dL   BUN 22 8 - 27 mg/dL   Creatinine, Ser 1.23 0.76 - 1.27 mg/dL   GFR calc non Af Amer 61 >59 mL/min/1.73   GFR calc Af Amer 71 >59 mL/min/1.73   BUN/Creatinine Ratio 18 10 - 24   Sodium 142 134 - 144 mmol/L   Potassium 4.9 3.5 - 5.2 mmol/L   Chloride 107 (H) 96 - 106 mmol/L   CO2 20 20 - 29 mmol/L   Calcium 9.3 8.6 - 10.2 mg/dL  Pro b natriuretic peptide  Result Value Ref Range   NT-Pro BNP 1,322 (H) 0 - 376 pg/mL  CBC  Result Value Ref Range   WBC 8.6 3.4 - 10.8 x10E3/uL   RBC 5.32 4.14 - 5.80 x10E6/uL   Hemoglobin 14.7 13.0 - 17.7 g/dL   Hematocrit 44.5 37.5 - 51.0 %   MCV 84 79 - 97 fL   MCH 27.6 26.6 - 33.0 pg   MCHC 33.0 31.5 - 35.7 g/dL   RDW 17.1 (H) 12.3 - 15.4 %   Platelets 216 150 - 450 x10E3/uL    Patient Active Problem List   Diagnosis Date Noted  . Microalbuminuria 04/23/2016  . PAF (paroxysmal atrial fibrillation) ([place]) 12/29/2015  . Rectal bleeding 07/18/2015  . Dysphagia 07/18/2015  . Chronic anticoagulation 03/14/2015  . Morbid obesity ([place]) 03/14/2015  . Exertional shortness of breath 01/12/2015  . Abnormal ECG 01/12/2015  .  Encounter for screening colonoscopy 05/09/2014  . Esophageal reflux 05/09/2014  . Other dysphagia 05/09/2014  . Evaluate for OSA (obstructive sleep apnea) 02/28/2014  . Hyperlipidemia 02/28/2014  . Prostate cancer ([place]) 12/14/2013  . PSA elevation 10/26/2013  . Erectile dysfunction 10/26/2013  . Type II diabetes mellitus, uncontrolled ([place]) 10/19/2013  . DVT (deep venous thrombosis) ([place])   . Left leg DVT ([place]) 09/28/2013  . Leg edema, left 09/28/2013   Past Medical History:  Diagnosis Date  . Diabetes mellitus without complication ([place])   . DVT (deep venous thrombosis) ([facility])    left leg.  [name] GERD (gastroesophageal reflux disease)   . Hypercholesterolemia   . Hypertension   . Prostate cancer ([place]) 12/06/13   gleason 4+3=7, 6/12 cores positive. seed implant and radiation   Past Surgical History:    Procedure Laterality Date  . APPENDECTOMY    . BALLOON DILATION N/A 08/02/2015   Procedure: BALLOON DILATION;  Surgeon: [name], MD;  Location: [facility];  Service: Endoscopy;  Laterality: N/A;  . COLONOSCOPY WITH PROPOFOL N/A 08/02/2015   Procedure: COLONOSCOPY WITH PROPOFOL w/ APC;  Surgeon: [name], MD;  Location: [facility];  Service: Endoscopy;  Laterality: N/A;  . ESOPHAGOGASTRODUODENOSCOPY (EGD) WITH PROPOFOL N/A 08/02/2015   Procedure: ESOPHAGOGASTRODUODENOSCOPY (EGD) WITH PROPOFOL/ possible dilation.;  Surgeon: [name], MD;  Location: [facility];  Service: Endoscopy;  Laterality: N/A;  . KNEE SURGERY     right knee orthoscopic  . PROSTATE BIOPSY  12/06/13   Gleason 4+3=7, volume 35 gm  . [place]  . TONSILLECTOMY     No Known Allergies Prior to Admission medications   Medication Sig Start Date End Date Taking? Authorizing Provider  amLODipine (NORVASC) 2.5 MG tablet Take 1 tablet (2.5 mg total) by mouth daily. 10/05/17  Yes Wendie Agreste, MD  atorvastatin (LIPITOR) 40 MG tablet TAKE ONE TABLET BY MOUTH ONCE DAILY. 10/05/17  Yes Wendie Agreste, MD  blood glucose meter kit and supplies KIT Test blood sugar daily as directed. Dx code: E11.9 01/23/15  Yes Darlyne Russian, MD  CINNAMON PO Take 1,000 mg by mouth daily.   Yes [provider]  furosemide (LASIX) 20 MG tablet Take 1 tablet (20 mg total) by mouth daily. 04/16/18  Yes Wendie Agreste, MD  glipiZIDE (GLUCOTROL XL) 2.5 MG 24 hr tablet TAKE 1 TABLET BY MOUTH ONCE DAILY WITH BREAKFAST 07/01/17  Yes Elayne Snare, MD  Icosapent Ethyl (VASCEPA) 1 g CAPS Take 2 capsules (2 g total) by mouth 2 (two) times daily. 02/02/18  Yes Elayne Snare, MD  INVOKAMET XR [id number] MG TB24 TAKE 2 TABLETS DAILY WITH BREAKFAST 12/14/17  Yes Elayne Snare, MD  losartan (COZAAR) 100 MG tablet Take 1 tablet (100 mg total) by mouth daily. 10/05/17  Yes Wendie Agreste, MD   metoprolol tartrate (LOPRESSOR) 25 MG tablet Take 1 tablet (25 mg total) by mouth 2 (two) times daily. 10/05/17  Yes Wendie Agreste, MD  omeprazole (PRILOSEC) 40 MG capsule One tablet daily 05/12/17  Yes Wendie Agreste, MD  ONE [facility] ULTRA TEST test strip CHECK TWICE A DAY 03/01/18  Yes Elayne Snare, MD  rivaroxaban (XARELTO) 20 MG TABS tablet TAKE ONE TABLET BY MOUTH ONCE DAILY WITH SUPPER 10/05/17  Yes Wendie Agreste, MD  Semaglutide [facility]) 0.25 or 0.5 MG/DOSE SOPN Inject 0.5 mg into the skin once a week. Inject 0.45m once weekly for the  first two weeks, and then increase to 0.2m weekly. 12/29/17  Yes KElayne Snare MD  sulfamethoxazole-trimethoprim (BACTRIM DS,SEPTRA DS) 800-160 MG tablet  04/02/18  Yes [provider]  Vitamin D, Cholecalciferol, 400 UNITS TABS Take 400 Units by mouth daily.   Yes [provider]   Social History   Socioeconomic History  . Marital status: Married    Spouse name: Not on file  . Number of children: 1  . Years of education: Not on file  . Highest education level: Not on file  Occupational History    Employer: RETIRED  Social Needs  . Financial resource strain: Not on file  . Food insecurity:    Worry: Not on file    Inability: Not on file  . Transportation needs:    Medical: Not on file    Non-medical: Not on file  Tobacco Use  . Smoking status: Former Smoker    Packs/day: 0.50    Years: 23.00    Pack years: 11.50    Types: Cigarettes    Last attempt to quit: 08/01/2012    Years since quitting: 5.7  . Smokeless tobacco: Never Used  Substance and Sexual Activity  . Alcohol use: Yes    Alcohol/week: 0.0 standard drinks    Comment: very seldom, maybe 3x per year  . Drug use: No  . Sexual activity: Yes    Birth control/protection: Abstinence  Lifestyle  . Physical activity:    Days per week: Not on file    Minutes per session: Not on file  . Stress: Not on file  Relationships  . Social connections:    Talks on phone:  Not on file    Gets together: Not on file    Attends religious service: Not on file    Active member of club or organization: Not on file    Attends meetings of clubs or organizations: Not on file    Relationship status: Not on file  . Intimate partner violence:    Fear of current or ex partner: Not on file    Emotionally abused: Not on file    Physically abused: Not on file    Forced sexual activity: Not on file  Other Topics Concern  . 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 Negative for chest tightness.   Cardiovascular: Positive for leg swelling (improving). Negative for chest pain and palpitations.  Neurological: Positive for numbness (improved).      Objective:   Physical Exam  Constitutional: He is oriented to person, place, and time. He appears well-developed and well-nourished.  HENT:  Head: Normocephalic and atraumatic.  Eyes: Pupils are equal, round, and reactive to light. EOM are normal.  Neck: No JVD present. Carotid bruit is not present.  Cardiovascular: An irregular rhythm present. Exam reveals distant heart sounds.  No murmur heard. Pulmonary/Chest: Effort normal and breath sounds normal. He has no rales.  Musculoskeletal: He exhibits edema.  Slight firmness of LEs bilaterally with stasis changes and hyperpigmentation. 1+ pitting edema to proximal third of tibia.  Neurological: He is alert and oriented to person, place, and time.  Skin: Skin is warm  and dry.  Psychiatric: He has a normal mood and affect.  Vitals reviewed.  Vitals:   04/20/18 1113  BP: 123/80  Pulse: (!) 101  Temp: 98.2 F (36.8 C)  TempSrc: Oral  SpO2: 91%  Weight: (!)  317 lb (143.8 kg)  Height: '5\' 11"'  (1.803 m)   EKG: atrial fibrillation, rate 99, no apparent significant ST or t wave changes.     Over 40 minutes of care with greater 50% counseling and coordination of care with cardiology and chart review.   Assessment & Plan:    Tommy Yang is a 67 y.o. male Congestive heart failure, unspecified HF chronicity, unspecified heart failure type ([place]) - Plan: ECHOCARDIOGRAM COMPLETE, furosemide (LASIX) 20 MG tablet  Elevated brain natriuretic peptide (BNP) level - Plan: EKG 12-Lead, ECHOCARDIOGRAM COMPLETE, furosemide (LASIX) 20 MG tablet  Peripheral edema - Plan: Basic metabolic panel, ECHOCARDIOGRAM COMPLETE, furosemide (LASIX) 20 MG tablet  Atrial fibrillation, unspecified type ([place]) - Plan: EKG 12-Lead, ECHOCARDIOGRAM COMPLETE  Essential hypertension - Plan: metoprolol tartrate (LOPRESSOR) 50 MG tablet  Suspected CHF from recurrence of atrial fibrillation.  Did have recent meloxicam, which may contribute to lower extremity edema.    -Some improvement with furosemide, will continue same dose.  Check potassium on BMP.  Continue potassium rich foods.  Prescription given for compression stockings.  -Schedule echocardiogram and follow-up with atrial fibrillation clinic.  Discussed with cardiology.  Additionally will increase metoprolol to 50 mg twice daily, potential orthostasis symptoms discussed, monitor BP.  ER/RTC precautions given.  Meds ordered this encounter  Medications  . furosemide (LASIX) 20 MG tablet    Sig: Take 1 tablet (20 mg total) by mouth daily.    Dispense:  30 tablet    Refill:  0  . metoprolol tartrate (LOPRESSOR) 50 MG tablet    Sig: Take 1 tablet (50 mg total) by mouth 2 (two) times daily.    Dispense:  60 tablet    Refill:  1   Patient Instructions    Your heart rhythm is back in atrial fibrillation.  The heart failure test was also slightly elevated, and possibly some heart failure due to the atrial fibrillation.  I  spoke to cardiology, and recommended increasing the metoprolol to 50 mg twice per day for now, continue your other medications.  Watch for lightheadedness or dizziness with that higher dose of metoprolol as it may decrease your blood pressure somewhat.  If that occurs, let me know.  Additionally I would like you to remain on the furosemide once per day until you have been seen by cardiology and can use compression stockings.  Cardiology should be calling you in the next few days for an appointment in their atrial fibrillation clinic in 1 week. Return to the clinic or go to the nearest emergency room if any of your symptoms worsen or new symptoms occur.   Peripheral Edema Peripheral edema is swelling that is caused by a buildup of fluid. Peripheral edema most often affects the lower legs, ankles, and feet. It can also develop in the arms, hands, and face. The area of the body that has peripheral edema will look swollen. It may also feel heavy or warm. Your clothes may start to feel tight. Pressing on the area may make a temporary dent in your skin. You may not be able to move your arm or leg as much as usual. There are many causes of peripheral edema. It can be a complication of other diseases, such as congestive  heart failure, kidney disease, or a problem with your blood circulation. It also can be a side effect of certain medicines. It often happens to women during pregnancy. Sometimes, the cause is not known. Treating the underlying condition is often the only treatment for peripheral edema. Follow these instructions at home: Pay attention to any changes in your symptoms. Take these actions to help with your discomfort:  Raise (elevate) your legs while you are sitting or lying down.  Move around often to prevent stiffness and to lessen swelling. Do not sit or stand for long periods of time.  Wear support stockings as told by your health care provider.  Follow instructions from your health care  provider about limiting salt (sodium) in your diet. Sometimes eating less salt can reduce swelling.  Take over-the-counter and prescription medicines only as told by your health care provider. Your health care provider may prescribe medicine to help your body get rid of excess water (diuretic).  Keep all follow-up visits as told by your health care provider. This is important.  Contact a health care provider if:  You have a fever.  Your edema starts suddenly or is getting worse, especially if you are pregnant or have a medical condition.  You have swelling in only one leg.  You have increased swelling and pain in your legs. Get help right away if:  You develop shortness of breath, especially when you are lying down.  You have pain in your chest or abdomen.  You feel weak.  You faint. This information is not intended to replace advice given to you by your health care provider. Make sure you discuss any questions you have with your health care provider. Document Released: 09/25/2004 Document Revised: 01/21/2016 Document Reviewed: 02/28/2015 Elsevier Interactive Patient Education  2018 [employer]. Atrial Fibrillation Atrial fibrillation is a type of irregular or rapid heartbeat (arrhythmia). In atrial fibrillation, the heart quivers continuously in a chaotic pattern. This occurs when parts of the heart receive disorganized signals that make the heart unable to pump blood normally. This can increase the risk for stroke, heart failure, and other heart-related conditions. There are different types of atrial fibrillation, including:  Paroxysmal atrial fibrillation. This type starts suddenly, and it usually stops on its own shortly after it starts.  Persistent atrial fibrillation. This type often lasts longer than a week. It may stop on its own or with treatment.  Long-lasting persistent atrial fibrillation. This type lasts longer than 12 months.  Permanent atrial fibrillation. This  type does not go away.  Talk with your health care provider to learn about the type of atrial fibrillation that you have. What are the causes? This condition is caused by some heart-related conditions or procedures, including:  A heart attack.  Coronary artery disease.  Heart failure.  Heart valve conditions.  High blood pressure.  Inflammation of the sac that surrounds the heart (pericarditis).  Heart surgery.  Certain heart rhythm disorders, such as Wolf-Parkinson-White syndrome.  Other causes include:  Pneumonia.  Obstructive sleep apnea.  Blockage of an artery in the lungs (pulmonary embolism, or PE).  Lung cancer.  Chronic lung disease.  Thyroid problems, especially if the thyroid is overactive (hyperthyroidism).  Caffeine.  Excessive alcohol use or illegal drug use.  Use of some medicines, including certain decongestants and diet pills.  Sometimes, the cause cannot be found. What increases the risk? This condition is more likely to develop in:  People who are older in age.  People who smoke.  People who have diabetes mellitus.  People who are overweight (obese).  Athletes who exercise vigorously.  What are the signs or symptoms? Symptoms of this condition include:  A feeling that your heart is beating rapidly or irregularly.  A feeling of discomfort or pain in your chest.  Shortness of breath.  Sudden light-headedness or weakness.  Getting tired easily during exercise.  In some cases, there are no symptoms. How is this diagnosed? Your health care provider may be able to detect atrial fibrillation when taking your pulse. If detected, this condition may be diagnosed with:  An electrocardiogram (ECG).  A Holter monitor test that records your heartbeat patterns over a 24-hour period.  Transthoracic echocardiogram (TTE) to evaluate how blood flows through your heart.  Transesophageal echocardiogram (TEE) to view more detailed images of  your heart.  A stress test.  Imaging tests, such as a CT scan or chest X-ray.  Blood tests.  How is this treated? The main goals of treatment are to prevent blood clots from forming and to keep your heart beating at a normal rate and rhythm. The type of treatment that you receive depends on many factors, such as your underlying medical conditions and how you feel when you are experiencing atrial fibrillation. This condition may be treated with:  Medicine to slow down the heart rate, bring the heart's rhythm back to normal, or prevent clots from forming.  Electrical cardioversion. This is a procedure that resets your heart's rhythm by delivering a controlled, low-energy shock to the heart through your skin.  Different types of ablation, such as catheter ablation, catheter ablation with pacemaker, or surgical ablation. These procedures destroy the heart tissues that send abnormal signals. When the pacemaker is used, it is placed under your skin to help your heart beat in a regular rhythm.  Follow these instructions at home:  Take over-the counter and prescription medicines only as told by your health care provider.  If your health care provider prescribed a blood-thinning medicine (anticoagulant), take it exactly as told. Taking too much blood-thinning medicine can cause bleeding. If you do not take enough blood-thinning medicine, you will not have the protection that you need against stroke and other problems.  Do not use tobacco products, including cigarettes, chewing tobacco, and e-cigarettes. If you need help quitting, ask your health care provider.  If you have obstructive sleep apnea, manage your condition as told by your health care provider.  Do not drink alcohol.  Do not drink beverages that contain caffeine, such as coffee, soda, and tea.  Maintain a healthy weight. Do not use diet pills unless your health care provider approves. Diet pills may make heart problems  worse.  Follow diet instructions as told by your health care provider.  Exercise regularly as told by your health care provider.  Keep all follow-up visits as told by your health care provider. This is important. How is this prevented?  Avoid drinking beverages that contain caffeine or alcohol.  Avoid certain medicines, especially medicines that are used for breathing problems.  Avoid certain herbs and herbal medicines, such as those that contain ephedra or ginseng.  Do not use illegal drugs, such as cocaine and amphetamines.  Do not smoke.  Manage your high blood pressure. Contact a health care provider if:  You notice a change in the rate, rhythm, or strength of your heartbeat.  You are taking an anticoagulant and you notice increased bruising.  You tire more easily when you exercise or exert yourself.  Get help right away if:  You have chest pain, abdominal pain, sweating, or weakness.  You feel nauseous.  You notice blood in your vomit, bowel movement, or urine.  You have shortness of breath.  You suddenly have swollen feet and ankles.  You feel dizzy.  You have sudden weakness or numbness of the face, arm, or leg, especially on one side of the body.  You have trouble speaking, trouble understanding, or both (aphasia).  Your face or your eyelid droops on one side. These symptoms may represent a serious problem that is an emergency. Do not wait to see if the symptoms will go away. Get medical help right away. Call your local emergency services (911 in the [place] Do not drive yourself to the hospital. This information is not intended to replace advice given to you by your health care provider. Make sure you discuss any questions you have with your health care provider. Document Released: 08/18/2005 Document Revised: 12/26/2015 Document Reviewed: 12/13/2014 Elsevier Interactive Patient Education  2018 [employer].    Heart Failure Heart failure means your heart  has trouble pumping blood. This makes it hard for your body to work well. Heart failure is usually a long-term (chronic) condition. You must take good care of yourself and follow your doctor's treatment plan. Follow these instructions at home:  Take your heart medicine as told by your doctor. ? Do not stop taking medicine unless your doctor tells you to. ? Do not skip any dose of medicine. ? Refill your medicines before they run out. ? Take other medicines only as told by your doctor or pharmacist.  Stay active if told by your doctor. The elderly and people with severe heart failure should talk with a doctor about physical activity.  Eat heart-healthy foods. Choose foods that are without trans fat and are low in saturated fat, cholesterol, and salt (sodium). This includes fresh or frozen fruits and vegetables, fish, lean meats, fat-free or low-fat dairy foods, whole grains, and high-fiber foods. Lentils and dried peas and beans (legumes) are also good choices.  Limit salt if told by your doctor.  Cook in a healthy way. Roast, grill, broil, bake, poach, steam, or stir-fry foods.  Limit fluids as told by your doctor.  Weigh yourself every morning. Do this after you pee (urinate) and before you eat breakfast. Write down your weight to give to your doctor.  Take your blood pressure and write it down if your doctor tells you to.  Ask your doctor how to check your pulse. Check your pulse as told.  Lose weight if told by your doctor.  Stop smoking or chewing tobacco. Do not use gum or patches that help you quit without your doctor's approval.  Schedule and go to doctor visits as told.  Nonpregnant women should have no more than 1 drink a day. Men should have no more than 2 drinks a day. Talk to your doctor about drinking alcohol.  Stop illegal drug use.  Stay current with shots (immunizations).  Manage your health conditions as told by your doctor.  Learn to manage your  stress.  Rest when you are tired.  If it is really hot outside: ? Avoid intense activities. ? Use air conditioning or fans, or get in a cooler place. ? Avoid caffeine and alcohol. ? Wear loose-fitting, lightweight, and light-colored clothing.  If it is really cold outside: ? Avoid intense activities. ? Layer your clothing. ? Wear mittens or gloves, a hat, and a scarf  when going outside. ? Avoid alcohol.  Learn about heart failure and get support as needed.  Get help to maintain or improve your quality of life and your ability to care for yourself as needed. Contact a doctor if:  You gain weight quickly.  You are more short of breath than usual.  You cannot do your normal activities.  You tire easily.  You cough more than normal, especially with activity.  You have any or more puffiness (swelling) in areas such as your hands, feet, ankles, or belly (abdomen).  You cannot sleep because it is hard to breathe.  You feel like your heart is beating fast (palpitations).  You get dizzy or light-headed when you stand up. Get help right away if:  You have trouble breathing.  There is a change in mental status, such as becoming less alert or not being able to focus.  You have chest pain or discomfort.  You faint. This information is not intended to replace advice given to you by your health care provider. Make sure you discuss any questions you have with your health care provider. Document Released: 05/27/2008 Document Revised: 01/24/2016 Document Reviewed: 10/04/2012 Elsevier Interactive Patient Education  [employer].    If you have lab work done today you will be contacted with your lab results within the next 2 weeks.  If you have not heard from [name] then please contact us. The fastest way to get your results is to register for My Chart.   IF you received an x-ray today, you will receive an invoice from [facility]. Please contact [facility] at  [phone] with questions or concerns regarding your invoice.   IF you received labwork today, you will receive an invoice from [facility]. Please contact [facility] at [phone] with questions or concerns regarding your invoice.   Our billing staff will not be able to assist you with questions regarding bills from these companies.  You will be contacted with the lab results as soon as they are available. The fastest way to get your results is to activate your My Chart account. Instructions are located on the last page of this paperwork. If you have not heard from [name] regarding the results in 2 weeks, please contact this office.       I personally performed the services described in this documentation, which was scribed in my presence. The recorded information has been reviewed and considered for accuracy and completeness, addended by me as needed, and agree with information above.  Signed,   Merri Ray, MD [facility].  04/20/18 12:41 PM

## 2018-04-20 NOTE — Patient Instructions (Addendum)
Your heart rhythm is back in atrial fibrillation.  The heart failure test was also slightly elevated, and possibly some heart failure due to the atrial fibrillation.  I spoke to cardiology, and recommended increasing the metoprolol to 50 mg twice per day for now, continue your other medications.  Watch for lightheadedness or dizziness with that higher dose of metoprolol as it may decrease your blood pressure somewhat.  If that occurs, let me know.  Additionally I would like you to remain on the furosemide once per day until you have been seen by cardiology and can use compression stockings.  Cardiology should be calling you in the next few days for an appointment in their atrial fibrillation clinic in 1 week. Return to the clinic or go to the nearest emergency room if any of your symptoms worsen or new symptoms occur.   Peripheral Edema Peripheral edema is swelling that is caused by a buildup of fluid. Peripheral edema most often affects the lower legs, ankles, and feet. It can also develop in the arms, hands, and face. The area of the body that has peripheral edema will look swollen. It may also feel heavy or warm. Your clothes may start to feel tight. Pressing on the area may make a temporary dent in your skin. You may not be able to move your arm or leg as much as usual. There are many causes of peripheral edema. It can be a complication of other diseases, such as congestive heart failure, kidney disease, or a problem with your blood circulation. It also can be a side effect of certain medicines. It often happens to women during pregnancy. Sometimes, the cause is not known. Treating the underlying condition is often the only treatment for peripheral edema. Follow these instructions at home: Pay attention to any changes in your symptoms. Take these actions to help with your discomfort:  Raise (elevate) your legs while you are sitting or lying down.  Move around often to prevent stiffness and to  lessen swelling. Do not sit or stand for long periods of time.  Wear support stockings as told by your health care provider.  Follow instructions from your health care provider about limiting salt (sodium) in your diet. Sometimes eating less salt can reduce swelling.  Take over-the-counter and prescription medicines only as told by your health care provider. Your health care provider may prescribe medicine to help your body get rid of excess water (diuretic).  Keep all follow-up visits as told by your health care provider. This is important.  Contact a health care provider if:  You have a fever.  Your edema starts suddenly or is getting worse, especially if you are pregnant or have a medical condition.  You have swelling in only one leg.  You have increased swelling and pain in your legs. Get help right away if:  You develop shortness of breath, especially when you are lying down.  You have pain in your chest or abdomen.  You feel weak.  You faint. This information is not intended to replace advice given to you by your health care provider. Make sure you discuss any questions you have with your health care provider. Document Released: 09/25/2004 Document Revised: 01/21/2016 Document Reviewed: 02/28/2015 Elsevier Interactive Patient Education  2018 [employer]. Atrial Fibrillation Atrial fibrillation is a type of irregular or rapid heartbeat (arrhythmia). In atrial fibrillation, the heart quivers continuously in a chaotic pattern. This occurs when parts of the heart receive disorganized signals that make the heart unable  to pump blood normally. This can increase the risk for stroke, heart failure, and other heart-related conditions. There are different types of atrial fibrillation, including:  Paroxysmal atrial fibrillation. This type starts suddenly, and it usually stops on its own shortly after it starts.  Persistent atrial fibrillation. This type often lasts longer than a  week. It may stop on its own or with treatment.  Long-lasting persistent atrial fibrillation. This type lasts longer than 12 months.  Permanent atrial fibrillation. This type does not go away.  Talk with your health care provider to learn about the type of atrial fibrillation that you have. What are the causes? This condition is caused by some heart-related conditions or procedures, including:  A heart attack.  Coronary artery disease.  Heart failure.  Heart valve conditions.  High blood pressure.  Inflammation of the sac that surrounds the heart (pericarditis).  Heart surgery.  Certain heart rhythm disorders, such as Wolf-Parkinson-White syndrome.  Other causes include:  Pneumonia.  Obstructive sleep apnea.  Blockage of an artery in the lungs (pulmonary embolism, or PE).  Lung cancer.  Chronic lung disease.  Thyroid problems, especially if the thyroid is overactive (hyperthyroidism).  Caffeine.  Excessive alcohol use or illegal drug use.  Use of some medicines, including certain decongestants and diet pills.  Sometimes, the cause cannot be found. What increases the risk? This condition is more likely to develop in:  People who are older in age.  People who smoke.  People who have diabetes mellitus.  People who are overweight (obese).  Athletes who exercise vigorously.  What are the signs or symptoms? Symptoms of this condition include:  A feeling that your heart is beating rapidly or irregularly.  A feeling of discomfort or pain in your chest.  Shortness of breath.  Sudden light-headedness or weakness.  Getting tired easily during exercise.  In some cases, there are no symptoms. How is this diagnosed? Your health care provider may be able to detect atrial fibrillation when taking your pulse. If detected, this condition may be diagnosed with:  An electrocardiogram (ECG).  A Holter monitor test that records your heartbeat patterns over a  24-hour period.  Transthoracic echocardiogram (TTE) to evaluate how blood flows through your heart.  Transesophageal echocardiogram (TEE) to view more detailed images of your heart.  A stress test.  Imaging tests, such as a CT scan or chest X-ray.  Blood tests.  How is this treated? The main goals of treatment are to prevent blood clots from forming and to keep your heart beating at a normal rate and rhythm. The type of treatment that you receive depends on many factors, such as your underlying medical conditions and how you feel when you are experiencing atrial fibrillation. This condition may be treated with:  Medicine to slow down the heart rate, bring the heart's rhythm back to normal, or prevent clots from forming.  Electrical cardioversion. This is a procedure that resets your heart's rhythm by delivering a controlled, low-energy shock to the heart through your skin.  Different types of ablation, such as catheter ablation, catheter ablation with pacemaker, or surgical ablation. These procedures destroy the heart tissues that send abnormal signals. When the pacemaker is used, it is placed under your skin to help your heart beat in a regular rhythm.  Follow these instructions at home:  Take over-the counter and prescription medicines only as told by your health care provider.  If your health care provider prescribed a blood-thinning medicine (anticoagulant), take it exactly  as told. Taking too much blood-thinning medicine can cause bleeding. If you do not take enough blood-thinning medicine, you will not have the protection that you need against stroke and other problems.  Do not use tobacco products, including cigarettes, chewing tobacco, and e-cigarettes. If you need help quitting, ask your health care provider.  If you have obstructive sleep apnea, manage your condition as told by your health care provider.  Do not drink alcohol.  Do not drink beverages that contain caffeine,  such as coffee, soda, and tea.  Maintain a healthy weight. Do not use diet pills unless your health care provider approves. Diet pills may make heart problems worse.  Follow diet instructions as told by your health care provider.  Exercise regularly as told by your health care provider.  Keep all follow-up visits as told by your health care provider. This is important. How is this prevented?  Avoid drinking beverages that contain caffeine or alcohol.  Avoid certain medicines, especially medicines that are used for breathing problems.  Avoid certain herbs and herbal medicines, such as those that contain ephedra or ginseng.  Do not use illegal drugs, such as cocaine and amphetamines.  Do not smoke.  Manage your high blood pressure. Contact a health care provider if:  You notice a change in the rate, rhythm, or strength of your heartbeat.  You are taking an anticoagulant and you notice increased bruising.  You tire more easily when you exercise or exert yourself. Get help right away if:  You have chest pain, abdominal pain, sweating, or weakness.  You feel nauseous.  You notice blood in your vomit, bowel movement, or urine.  You have shortness of breath.  You suddenly have swollen feet and ankles.  You feel dizzy.  You have sudden weakness or numbness of the face, arm, or leg, especially on one side of the body.  You have trouble speaking, trouble understanding, or both (aphasia).  Your face or your eyelid droops on one side. These symptoms may represent a serious problem that is an emergency. Do not wait to see if the symptoms will go away. Get medical help right away. Call your local emergency services (911 in the [place] Do not drive yourself to the hospital. This information is not intended to replace advice given to you by your health care provider. Make sure you discuss any questions you have with your health care provider. Document Released: 08/18/2005 Document  Revised: 12/26/2015 Document Reviewed: 12/13/2014 Elsevier Interactive Patient Education  2018 [employer].    Heart Failure Heart failure means your heart has trouble pumping blood. This makes it hard for your body to work well. Heart failure is usually a long-term (chronic) condition. You must take good care of yourself and follow your doctor's treatment plan. Follow these instructions at home:  Take your heart medicine as told by your doctor. ? Do not stop taking medicine unless your doctor tells you to. ? Do not skip any dose of medicine. ? Refill your medicines before they run out. ? Take other medicines only as told by your doctor or pharmacist.  Stay active if told by your doctor. The elderly and people with severe heart failure should talk with a doctor about physical activity.  Eat heart-healthy foods. Choose foods that are without trans fat and are low in saturated fat, cholesterol, and salt (sodium). This includes fresh or frozen fruits and vegetables, fish, lean meats, fat-free or low-fat dairy foods, whole grains, and high-fiber foods. Lentils and dried  peas and beans (legumes) are also good choices.  Limit salt if told by your doctor.  Cook in a healthy way. Roast, grill, broil, bake, poach, steam, or stir-fry foods.  Limit fluids as told by your doctor.  Weigh yourself every morning. Do this after you pee (urinate) and before you eat breakfast. Write down your weight to give to your doctor.  Take your blood pressure and write it down if your doctor tells you to.  Ask your doctor how to check your pulse. Check your pulse as told.  Lose weight if told by your doctor.  Stop smoking or chewing tobacco. Do not use gum or patches that help you quit without your doctor's approval.  Schedule and go to doctor visits as told.  Nonpregnant women should have no more than 1 drink a day. Men should have no more than 2 drinks a day. Talk to your doctor about drinking  alcohol.  Stop illegal drug use.  Stay current with shots (immunizations).  Manage your health conditions as told by your doctor.  Learn to manage your stress.  Rest when you are tired.  If it is really hot outside: ? Avoid intense activities. ? Use air conditioning or fans, or get in a cooler place. ? Avoid caffeine and alcohol. ? Wear loose-fitting, lightweight, and light-colored clothing.  If it is really cold outside: ? Avoid intense activities. ? Layer your clothing. ? Wear mittens or gloves, a hat, and a scarf when going outside. ? Avoid alcohol.  Learn about heart failure and get support as needed.  Get help to maintain or improve your quality of life and your ability to care for yourself as needed. Contact a doctor if:  You gain weight quickly.  You are more short of breath than usual.  You cannot do your normal activities.  You tire easily.  You cough more than normal, especially with activity.  You have any or more puffiness (swelling) in areas such as your hands, feet, ankles, or belly (abdomen).  You cannot sleep because it is hard to breathe.  You feel like your heart is beating fast (palpitations).  You get dizzy or light-headed when you stand up. Get help right away if:  You have trouble breathing.  There is a change in mental status, such as becoming less alert or not being able to focus.  You have chest pain or discomfort.  You faint. This information is not intended to replace advice given to you by your health care provider. Make sure you discuss any questions you have with your health care provider. Document Released: 05/27/2008 Document Revised: 01/24/2016 Document Reviewed: 10/04/2012 Elsevier Interactive Patient Education  [employer].    If you have lab work done today you will be contacted with your lab results within the next 2 weeks.  If you have not heard from [name] then please contact us. The fastest way to get your results  is to register for My Chart.   IF you received an x-ray today, you will receive an invoice from [facility]. Please contact [facility] at [phone] with questions or concerns regarding your invoice.   IF you received labwork today, you will receive an invoice from [facility]. Please contact [facility] at [phone] with questions or concerns regarding your invoice.   Our billing staff will not be able to assist you with questions regarding bills from these companies.  You will be contacted with the lab results as soon as they are available. The  fastest way to get your results is to activate your My Chart account. Instructions are located on the last page of this paperwork. If you have not heard from [name] regarding the results in 2 weeks, please contact this office.

## 2018-04-21 LAB — BASIC METABOLIC PANEL
BUN/Creatinine Ratio: 16 (ref 10–24)
BUN: 24 mg/dL (ref 8–27)
CO2: 26 mmol/L (ref 20–29)
Calcium: 10 mg/dL (ref 8.6–10.2)
Chloride: 103 mmol/L (ref 96–106)
Creatinine, Ser: 1.46 mg/dL — ABNORMAL HIGH (ref 0.76–1.27)
GFR calc Af Amer: 58 mL/min/{1.73_m2} — ABNORMAL LOW (ref >59)
GFR calc non Af Amer: 50 mL/min/{1.73_m2} — ABNORMAL LOW (ref >59)
Glucose: 117 mg/dL — ABNORMAL HIGH (ref 65–99)
Potassium: 5.2 mmol/L (ref 3.5–5.2)
Sodium: 144 mmol/L (ref 134–144)

## 2018-04-21 NOTE — Telephone Encounter (Signed)
Appointment with [facility] tomorrow

## 2018-04-22 ENCOUNTER — Encounter (HOSPITAL_COMMUNITY): Payer: Self-pay | Admitting: Nurse Practitioner

## 2018-04-22 ENCOUNTER — Ambulatory Visit: Payer: BLUE CROSS/BLUE SHIELD | Admitting: Family Medicine

## 2018-04-22 ENCOUNTER — Ambulatory Visit (HOSPITAL_COMMUNITY)
Admission: RE | Admit: 2018-04-22 | Discharge: 2018-04-22 | Disposition: A | Discharge status: Home / Self Care | Payer: BLUE CROSS/BLUE SHIELD | Source: Ambulatory Visit | Attending: Nurse Practitioner | Admitting: Nurse Practitioner

## 2018-04-22 VITALS — BP 126/68 | HR 86 | Ht 71.0 in | Wt 316.4 lb

## 2018-04-22 DIAGNOSIS — Z7901 Long term (current) use of anticoagulants: Secondary | ICD-10-CM | POA: Insufficient documentation

## 2018-04-22 DIAGNOSIS — E119 Type 2 diabetes mellitus without complications: Secondary | ICD-10-CM | POA: Insufficient documentation

## 2018-04-22 DIAGNOSIS — K219 Gastro-esophageal reflux disease without esophagitis: Secondary | ICD-10-CM | POA: Diagnosis not present

## 2018-04-22 DIAGNOSIS — Z86718 Personal history of other venous thrombosis and embolism: Secondary | ICD-10-CM | POA: Diagnosis not present

## 2018-04-22 DIAGNOSIS — I10 Essential (primary) hypertension: Secondary | ICD-10-CM | POA: Insufficient documentation

## 2018-04-22 DIAGNOSIS — Z87891 Personal history of nicotine dependence: Secondary | ICD-10-CM | POA: Insufficient documentation

## 2018-04-22 DIAGNOSIS — Z6841 Body Mass Index (BMI) 40.0 and over, adult: Secondary | ICD-10-CM | POA: Insufficient documentation

## 2018-04-22 DIAGNOSIS — Z79899 Other long term (current) drug therapy: Secondary | ICD-10-CM | POA: Diagnosis not present

## 2018-04-22 DIAGNOSIS — Z7984 Long term (current) use of oral hypoglycemic drugs: Secondary | ICD-10-CM | POA: Insufficient documentation

## 2018-04-22 DIAGNOSIS — I480 Paroxysmal atrial fibrillation: Secondary | ICD-10-CM | POA: Insufficient documentation

## 2018-04-22 DIAGNOSIS — G4730 Sleep apnea, unspecified: Secondary | ICD-10-CM | POA: Diagnosis not present

## 2018-04-22 DIAGNOSIS — Z8546 Personal history of malignant neoplasm of prostate: Secondary | ICD-10-CM | POA: Diagnosis not present

## 2018-04-22 DIAGNOSIS — E7800 Pure hypercholesterolemia, unspecified: Secondary | ICD-10-CM | POA: Insufficient documentation

## 2018-04-22 NOTE — Progress Notes (Signed)
Primary Care Physician: Wendie Agreste, MD Referring Physician: Dr. Oval Linsey Cardiologist: Dr. Lorenda Peck is a 67 y.o. male with a h/o morbid obesity, DM. S/p prostate CA, SVT,.HTN, that is in the afib clinic for afib that was found at PCP office 8/19. He was c/o of shortness of breath and LLE since taking Lupron shot. He was found to be in afib with RVR. He has been on xarelto  for h/o DVT for some time. He was last seem by Dr. Claiborne Billings in 2017 after a bout of afib. Pt converted spontaneously. On PCP visit, BB was increased, echo ordered. He is now in rate controlled afib and he does not notice any longer He was surprised to find he is still in afib.[name] He is not sure if he is persistent or paroxysmal. He feels at baseline today. [place] that he  had a positive sleep study in past but  I can  Not find the order, and he couldn't  tolerate mask. No excessive caffeine or alcohol, no tobacco. Is sedentary.  Today, he denies symptoms of palpitations, chest pain, shortness of breath, orthopnea, PND, lower extremity edema, dizziness, presyncope, syncope, or neurologic sequela. The patient is tolerating medications without difficulties and is otherwise without complaint today.   Past Medical History:  Diagnosis Date  . Diabetes mellitus without complication ([place])   . DVT (deep venous thrombosis) ([facility])    left leg.  [name] GERD (gastroesophageal reflux disease)   . Hypercholesterolemia   . Hypertension   . Prostate cancer ([place]) 12/06/13   gleason 4+3=7, 6/12 cores positive. seed implant and radiation   Past Surgical History:  Procedure Laterality Date  . APPENDECTOMY    . BALLOON DILATION N/A 08/02/2015   Procedure: BALLOON DILATION;  Surgeon: [name], MD;  Location: [facility];  Service: Endoscopy;  Laterality: N/A;  . COLONOSCOPY WITH PROPOFOL N/A 08/02/2015   Procedure: COLONOSCOPY WITH PROPOFOL w/ APC;  Surgeon: [name], MD;  Location: [facility];  Service:  Endoscopy;  Laterality: N/A;  . ESOPHAGOGASTRODUODENOSCOPY (EGD) WITH PROPOFOL N/A 08/02/2015   Procedure: ESOPHAGOGASTRODUODENOSCOPY (EGD) WITH PROPOFOL/ possible dilation.;  Surgeon: [name], MD;  Location: [facility];  Service: Endoscopy;  Laterality: N/A;  . KNEE SURGERY     right knee orthoscopic  . PROSTATE BIOPSY  12/06/13   Gleason 4+3=7, volume 35 gm  . [place]  . TONSILLECTOMY      Current Outpatient Medications  Medication Sig Dispense Refill  . amLODipine (NORVASC) 2.5 MG tablet Take 1 tablet (2.5 mg total) by mouth daily. (Patient taking differently: Take 2.5 mg by mouth daily. ) 90 tablet 1  . atorvastatin (LIPITOR) 40 MG tablet TAKE ONE TABLET BY MOUTH ONCE DAILY. 90 tablet 1  . blood glucose meter kit and supplies KIT Test blood sugar daily as directed. Dx code: E11.9 1 each 0  . CINNAMON PO Take 1,000 mg by mouth daily.    . furosemide (LASIX) 20 MG tablet Take 1 tablet (20 mg total) by mouth daily. 30 tablet 0  . glipiZIDE (GLUCOTROL XL) 2.5 MG 24 hr tablet TAKE 1 TABLET BY MOUTH ONCE DAILY WITH BREAKFAST 90 tablet 2  . INVOKAMET XR 814 [id number] MG TB24 TAKE 2 TABLETS DAILY WITH BREAKFAST 180 tablet 3  . losartan (COZAAR) 100 MG tablet Take 1 tablet (100 mg total) by mouth daily. 90 tablet 1  . metoprolol tartrate (LOPRESSOR) 50 MG tablet  Take 1 tablet (50 mg total) by mouth 2 (two) times daily. 60 tablet 1  . omeprazole (PRILOSEC) 40 MG capsule One tablet daily 90 capsule 3  . ONE TOUCH ULTRA TEST test strip CHECK TWICE A DAY 200 each 1  . rivaroxaban (XARELTO) 20 MG TABS tablet TAKE ONE TABLET BY MOUTH ONCE DAILY WITH SUPPER 90 tablet 1  . Semaglutide (OZEMPIC) 0.25 or 0.5 MG/DOSE SOPN Inject 0.5 mg into the skin once a week. Inject 0.26m once weekly for the first two weeks, and then increase to 0.532mweekly. 3 pen 1  . sulfamethoxazole-trimethoprim (BACTRIM DS,SEPTRA DS) 800-160 MG tablet     . Vitamin D, Cholecalciferol,  400 UNITS TABS Take 400 Units by mouth daily.    . Vanessa Kickthyl (VASCEPA) 1 g CAPS Take 2 capsules (2 g total) by mouth 2 (two) times daily. (Patient not taking: Reported on 04/22/2018) 120 capsule 2   No current facility-administered medications for this encounter.     No Known Allergies  Social History   Socioeconomic History  . Marital status: Married    Spouse name: Not on file  . Number of children: 1  . Years of education: Not on file  . Highest education level: Not on file  Occupational History    Employer: RETIRED  Social Needs  . Financial resource strain: Not on file  . Food insecurity:    Worry: Not on file    Inability: Not on file  . Transportation needs:    Medical: Not on file    Non-medical: Not on file  Tobacco Use  . Smoking status: Former Smoker    Packs/day: 0.50    Years: 23.00    Pack years: 11.50    Types: Cigarettes    Last attempt to quit: 08/01/2012    Years since quitting: 5.7  . Smokeless tobacco: Never Used  Substance and Sexual Activity  . Alcohol use: Yes    Alcohol/week: 0.0 standard drinks    Comment: very seldom, maybe 3x per year  . Drug use: No  . Sexual activity: Yes    Birth control/protection: Abstinence  Lifestyle  . Physical activity:    Days per week: Not on file    Minutes per session: Not on file  . Stress: Not on file  Relationships  . Social connections:    Talks on phone: Not on file    Gets together: Not on file    Attends religious service: Not on file    Active member of club or organization: Not on file    Attends meetings of clubs or organizations: Not on file    Relationship status: Not on file  . Intimate partner violence:    Fear of current or ex partner: Not on file    Emotionally abused: Not on file    Physically abused: Not on file    Forced sexual activity: Not on file  Other Topics Concern  . 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 Heart disease Mother        before age 65. Diabetes Mother   . Hyperlipidemia Mother   .  Varicose Veins Mother   . Heart disease Father   . Diabetes Father   . Cancer Neg Hx     ROS- All systems are reviewed and negative except as per the HPI above  Physical Exam: Vitals:   04/22/18 0848  BP: 126/68  Pulse: 86  Weight: (!) 143.5 kg  Height: _0  (1.803 m)   Wt Readings from Last 3 Encounters:  04/22/18 (!) 143.5 kg  04/20/18 (!) 143.8 kg  04/16/18 (!) 146.1 kg    Labs: Lab Results  Component Value Date   NA 144 04/20/2018   K 5.2 04/20/2018   CL 103 04/20/2018   CO2 26 04/20/2018   GLUCOSE 117 (H) 04/20/2018   BUN 24 04/20/2018   CREATININE 1.46 (H) 04/20/2018   CALCIUM 10.0 04/20/2018   MG 2.1 05/12/2017   Lab Results  Component Value Date   INR 1.55 (H) 10/19/2013   Lab Results  Component Value Date   CHOL 153 10/29/2017   HDL 30.40 (L) 10/29/2017   LDLCALC 92 10/29/2017   TRIG 152.0 (H) 10/29/2017     GEN- The patient is well appearing, alert and oriented x 3 today.   Head- normocephalic, atraumatic Eyes-  Sclera clear, conjunctiva pink Ears- hearing intact Oropharynx- clear Neck- supple, no JVP Lymph- no cervical lymphadenopathy Lungs- Clear to ausculation bilaterally, normal work of breathing Heart- irregular rate and rhythm, no murmurs, rubs or gallops, PMI not laterally displaced GI- soft, NT, ND, + BS Extremities- no clubbing, cyanosis, or edema MS- no significant deformity or atrophy Skin- no rash or lesion Psych- euthymic mood, full affect Neuro- strength and sensation are intact  EKG-afib at 85 bpm, qrs int 82 ms, qtc 433 ms Echo- pending next week Echo- 2016-Study Conclusions  - Left ventricle:  The cavity size was normal. Wall thickness was   increased in a pattern of mild LVH. There was mild focal basal   hypertrophy of the septum. Systolic function was normal. The   estimated ejection fraction was in the range of 55% to 60%. Wall   motion was normal; there were no regional wall motion   abnormalities. Doppler parameters are consistent with abnormal   left ventricular relaxation (grade 1 diastolic dysfunction).  Impressions:  - Normal LV function; grade 1 diastolic dysfunction.   Assessment and Plan: 1. Paroxysmal afib  General education re afib Triggers discussed He has untreated sleep apnea Pt is unaware of his afib and is not sure if he is persistent or paroxysmal I will place a one week monitor and f/u in 2 weeks If persistent will plan for cardioversion He is on xarelto 20 mg for a CHA2DS2VASc score of at least 5, states no missed doses Echo pending  2. BMI of 44.13 Regular exercise and weight loss recommended  3. HTN Stable  I have instructed the  pt if worsening symptoms, with increased shortness of breath, LLE, weight gain, call the office prior to next appointment.

## 2018-04-28 ENCOUNTER — Ambulatory Visit (HOSPITAL_COMMUNITY)
Admission: RE | Admit: 2018-04-28 | Discharge: 2018-04-28 | Disposition: A | Discharge status: Home / Self Care | Payer: BLUE CROSS/BLUE SHIELD | Source: Ambulatory Visit | Attending: Family Medicine | Admitting: Family Medicine

## 2018-04-28 ENCOUNTER — Other Ambulatory Visit (INDEPENDENT_AMBULATORY_CARE_PROVIDER_SITE_OTHER): Payer: BLUE CROSS/BLUE SHIELD

## 2018-04-28 DIAGNOSIS — R609 Edema, unspecified: Secondary | ICD-10-CM | POA: Insufficient documentation

## 2018-04-28 DIAGNOSIS — Z86718 Personal history of other venous thrombosis and embolism: Secondary | ICD-10-CM | POA: Diagnosis not present

## 2018-04-28 DIAGNOSIS — I110 Hypertensive heart disease with heart failure: Secondary | ICD-10-CM | POA: Insufficient documentation

## 2018-04-28 DIAGNOSIS — I4891 Unspecified atrial fibrillation: Secondary | ICD-10-CM | POA: Insufficient documentation

## 2018-04-28 DIAGNOSIS — E785 Hyperlipidemia, unspecified: Secondary | ICD-10-CM | POA: Diagnosis not present

## 2018-04-28 DIAGNOSIS — R0600 Dyspnea, unspecified: Secondary | ICD-10-CM | POA: Diagnosis not present

## 2018-04-28 DIAGNOSIS — M7989 Other specified soft tissue disorders: Secondary | ICD-10-CM | POA: Diagnosis not present

## 2018-04-28 DIAGNOSIS — Z8546 Personal history of malignant neoplasm of prostate: Secondary | ICD-10-CM | POA: Insufficient documentation

## 2018-04-28 DIAGNOSIS — K219 Gastro-esophageal reflux disease without esophagitis: Secondary | ICD-10-CM | POA: Insufficient documentation

## 2018-04-28 DIAGNOSIS — R7989 Other specified abnormal findings of blood chemistry: Secondary | ICD-10-CM | POA: Diagnosis not present

## 2018-04-28 DIAGNOSIS — E119 Type 2 diabetes mellitus without complications: Secondary | ICD-10-CM | POA: Diagnosis not present

## 2018-04-28 DIAGNOSIS — E1165 Type 2 diabetes mellitus with hyperglycemia: Secondary | ICD-10-CM

## 2018-04-28 DIAGNOSIS — I509 Heart failure, unspecified: Secondary | ICD-10-CM | POA: Insufficient documentation

## 2018-04-28 LAB — COMPREHENSIVE METABOLIC PANEL
ALT: 18 U/L (ref 0–53)
AST: 15 U/L (ref 0–37)
Albumin: 4.3 g/dL (ref 3.5–5.2)
Alkaline Phosphatase: 63 U/L (ref 39–117)
BUN: 35 mg/dL — ABNORMAL HIGH (ref 6–23)
CO2: 25 mEq/L (ref 19–32)
Calcium: 9.5 mg/dL (ref 8.4–10.5)
Chloride: 103 mEq/L (ref 96–112)
Creatinine, Ser: 1.6 mg/dL — ABNORMAL HIGH (ref 0.40–1.50)
GFR: 46.25 mL/min — ABNORMAL LOW (ref >60.00)
Glucose, Bld: 147 mg/dL — ABNORMAL HIGH (ref 70–99)
Potassium: 4.8 mEq/L (ref 3.5–5.1)
Sodium: 137 mEq/L (ref 135–145)
Total Bilirubin: 0.7 mg/dL (ref 0.2–1.2)
Total Protein: 7.5 g/dL (ref 6.0–8.3)

## 2018-04-28 LAB — MICROALBUMIN / CREATININE URINE RATIO
Creatinine,U: 78.7 mg/dL
Microalb Creat Ratio: 3.3 mg/g (ref 0.0–30.0)
Microalb, Ur: 2.6 mg/dL — ABNORMAL HIGH (ref 0.0–1.9)

## 2018-04-28 LAB — LIPID PANEL
Cholesterol: 169 mg/dL (ref 0–200)
HDL: 32.2 mg/dL — ABNORMAL LOW (ref >39.00)
LDL Cholesterol: 104 mg/dL — ABNORMAL HIGH (ref 0–99)
NonHDL: 137.01
Total CHOL/HDL Ratio: 5
Triglycerides: 166 mg/dL — ABNORMAL HIGH (ref 0.0–149.0)
VLDL: 33.2 mg/dL (ref 0.0–40.0)

## 2018-04-28 LAB — HEMOGLOBIN A1C: Hgb A1c MFr Bld: 7.5 % — ABNORMAL HIGH (ref 4.6–6.5)

## 2018-04-28 NOTE — Progress Notes (Signed)
  Echocardiogram 2D Echocardiogram has been performed.  Maite Burlison L Androw 04/28/2018, 2:53 PM

## 2018-05-04 NOTE — Progress Notes (Signed)
Patient ID: Tommy Yang, male   DOB: [birth year], 67 y.o.   MRN: [id number]           Reason for Appointment: Follow-up for Type 2 Diabetes   History of Present Illness:          Diagnosis: Type 2 diabetes mellitus, date of diagnosis: 05/2013       Past history:   He apparently had significant hyperglycemia in 2014 when seen in the urgent care center but did not establish with a PCP for control. He was started on treatment for his diabetes in 10/2013 and he was initially treated with metformin and Januvia A few months later he was also given glipizide ER to help his control when his A1c had gone up to 10.9 He says he was also seen by dietitian but no record available of this. Subsequently his blood sugar control had improved with A1c down to 6.8 in 9/15 On his consultation in 3/16 he had high blood sugars and weight gain; A1c was 9.1% with taking glipizide ER, Januvia and metformin.  Recent history:    Non-insulin hypoglycemic drugs the patient is taking are: Glipizide ER 2.5 mg in am, Invokamet 150/1000, 2 tablets daily, Ozempic 0.5 mg weekly   His A1c has been as low as 6.1 but now is about the same at 7.5, previously up to 7.4  Current blood sugar patterns and problems identified:  He thinks he was given some steroid by his urologist along with treatment for his prostate cancer and his blood sugar may have gone up with this  Although recently he appears to have lost weight he has been on diuretics  Even with increasing his Ozempic to 0.5 mg his level of control has not improved  He says that after finishing the injections sometimes he has a couple of drops of fluid come out  Overall he thinks he is still trying to plan his meals with low fat options  He also thinks that eating sugar-free desserts are okay but he is not taking to account carbohydrate or fat content in these  However currently his renal function has worsened and not clear if his [place] Auerbach is working  He  will still have postprandial hyperglycemia in the evening but not consistently  On an average his blood sugars are about the same as on the last visit from his recent download  Side effects from medications have been: None  Compliance with the medical regimen: Fair  Hypoglycemia: None    Glucose monitoring:  done 1-2 times a day         Glucometer: One Touch.      Blood Glucose readings by analysis of monitor download     PRE-MEAL Fasting Lunch Dinner Bedtime Overall  Glucose range:  99-175      Mean/median:  120  121    134+/-26   POST-MEAL PC Breakfast PC Lunch PC Dinner  Glucose range:    132-222  Mean/median:   134  174    Readings from last visit:  PRE-MEAL Fasting Lunch Dinner Bedtime Overall  Glucose range:  101-165   90-179    Mean/median:  140  131   140   POST-MEAL PC Breakfast PC Lunch PC Dinner  Glucose range:    141-213  Mean/median:    173     Self-care: The diet that the patient has been following is: tries to limit  fats .      Meals: 3 meals per day. Breakfast is  sometimes cereal,sausage  Fruits for snacks  Exercise: walking at times, only small duration  Dietician visit, most recent:?  2015 .               Weight history:    Wt Readings from Last 3 Encounters:  05/05/18 (!) 311 lb 6.4 oz (141.3 kg)  04/22/18 (!) 316 lb 6.4 oz (143.5 kg)  04/20/18 (!) 317 lb (143.8 kg)    Glycemic control:   Lab Results  Component Value Date   HGBA1C 7.5 (H) 04/28/2018   HGBA1C 7.4 (H) 01/28/2018   HGBA1C 7.2 (H) 10/29/2017   Lab Results  Component Value Date   MICROALBUR 2.6 (H) 04/28/2018   LDLCALC 104 (H) 04/28/2018   CREATININE 1.60 (H) 04/28/2018    No visits with results within 1 Week(s) from this visit.  Latest known visit with results is:  Lab on 04/28/2018  Component Date Value Ref Range Status  . Sodium 04/28/2018 137  135 - 145 mEq/L Final  . Potassium 04/28/2018 4.8  3.5 - 5.1 mEq/L Final  . Chloride 04/28/2018 103  96 - 112 mEq/L  Final  . CO2 04/28/2018 25  19 - 32 mEq/L Final  . Glucose, Bld 04/28/2018 147* 70 - 99 mg/dL Final  . BUN 04/28/2018 35* 6 - 23 mg/dL Final  . Creatinine, Ser 04/28/2018 1.60* 0.40 - 1.50 mg/dL Final  . Total Bilirubin 04/28/2018 0.7  0.2 - 1.2 mg/dL Final  . Alkaline Phosphatase 04/28/2018 63  39 - 117 U/L Final  . AST 04/28/2018 15  0 - 37 U/L Final  . ALT 04/28/2018 18  0 - 53 U/L Final  . Total Protein 04/28/2018 7.5  6.0 - 8.3 g/dL Final  . Albumin 04/28/2018 4.3  3.5 - 5.2 g/dL Final  . Calcium 04/28/2018 9.5  8.4 - 10.5 mg/dL Final  . GFR 04/28/2018 46.25* >60.00 mL/min Final  . Cholesterol 04/28/2018 169  0 - 200 mg/dL Final   ATP III Classification       Desirable:  < 200 mg/dL               Borderline High:  200 - 239 mg/dL          High:  > = 240 mg/dL  . Triglycerides 04/28/2018 166.0* 0.0 - 149.0 mg/dL Final   Normal:  <150 mg/dLBorderline High:  150 - 199 mg/dL  . HDL 04/28/2018 32.20* >39.00 mg/dL Final  . VLDL 04/28/2018 33.2  0.0 - 40.0 mg/dL Final  . LDL Cholesterol 04/28/2018 104* 0 - 99 mg/dL Final  . Total CHOL/HDL Ratio 04/28/2018 5   Final                  Men          Women1/2 Average Risk     3.4          3.3Average Risk          5.0          4.42X Average Risk          9.6          7.13X Average Risk          15.0          11.0                      . NonHDL 04/28/2018 137.01   Final   NOTE:  Non-HDL goal should be 30 mg/dL higher than  patient's LDL goal (i.e. LDL goal of < 70 mg/dL, would have non-HDL goal of < 100 mg/dL)  . Microalb, Ur 04/28/2018 2.6* 0.0 - 1.9 mg/dL Final  . Creatinine,U 04/28/2018 78.7  mg/dL Final  . Microalb Creat Ratio 04/28/2018 3.3  0.0 - 30.0 mg/g Final  . Hgb A1c MFr Bld 04/28/2018 7.5* 4.6 - 6.5 % Final   Glycemic Control Guidelines for People with Diabetes:Non Diabetic:  <6%Goal of Therapy: <7%Additional Action Suggested:  >8%       Allergies as of 05/05/2018   No Known Allergies     Medication List        Accurate as of  05/05/18  8:58 PM. Always use your most recent med list.          amLODipine 2.5 MG tablet Commonly known as:  NORVASC Take 1 tablet (2.5 mg total) by mouth daily.   atorvastatin 40 MG tablet Commonly known as:  LIPITOR TAKE ONE TABLET BY MOUTH ONCE DAILY.   blood glucose meter kit and supplies Kit Test blood sugar daily as directed. Dx code: E11.9   CINNAMON PO Take 1,000 mg by mouth daily.   furosemide 20 MG tablet Commonly known as:  LASIX Take 1 tablet (20 mg total) by mouth daily.   glipiZIDE 2.5 MG 24 hr tablet Commonly known as:  GLUCOTROL XL TAKE 1 TABLET BY MOUTH ONCE DAILY WITH BREAKFAST   Icosapent Ethyl 1 g Caps Take 2 capsules (2 g total) by mouth 2 (two) times daily.   INVOKAMET XR [phone] MG Tb24 Generic drug:  Canagliflozin-metFORMIN HCl ER TAKE 2 TABLETS DAILY WITH BREAKFAST   losartan 100 MG tablet Commonly known as:  COZAAR Take 1 tablet (100 mg total) by mouth daily.   metoprolol tartrate 50 MG tablet Commonly known as:  LOPRESSOR Take 1 tablet (50 mg total) by mouth 2 (two) times daily.   omeprazole 40 MG capsule Commonly known as:  PRILOSEC One tablet daily   ONE TOUCH ULTRA TEST test strip Generic drug:  glucose blood CHECK TWICE A DAY   rivaroxaban 20 MG Tabs tablet Commonly known as:  XARELTO TAKE ONE TABLET BY MOUTH ONCE DAILY WITH SUPPER   Semaglutide 0.25 or 0.5 MG/DOSE Sopn Inject 0.5 mg into the skin once a week. Inject 0.'25mg'$  once weekly for the first two weeks, and then increase to 0.'5mg'$  weekly.   sulfamethoxazole-trimethoprim 800-160 MG tablet Commonly known as:  BACTRIM DS,SEPTRA DS   Vitamin D (Cholecalciferol) 400 units Tabs Take 400 Units by mouth daily.       Allergies: No Known Allergies  Past Medical History:  Diagnosis Date  . Diabetes mellitus without complication ([place])   . DVT (deep venous thrombosis) ([facility])    left leg.  [name] GERD (gastroesophageal reflux disease)   . Hypercholesterolemia   .  Hypertension   . Prostate cancer ([place]) 12/06/13   gleason 4+3=7, 6/12 cores positive. seed implant and radiation    Past Surgical History:  Procedure Laterality Date  . APPENDECTOMY    . BALLOON DILATION N/A 08/02/2015   Procedure: BALLOON DILATION;  Surgeon: [name], MD;  Location: [facility];  Service: Endoscopy;  Laterality: N/A;  . COLONOSCOPY WITH PROPOFOL N/A 08/02/2015   Procedure: COLONOSCOPY WITH PROPOFOL w/ APC;  Surgeon: [name], MD;  Location: [facility];  Service: Endoscopy;  Laterality: N/A;  . ESOPHAGOGASTRODUODENOSCOPY (EGD) WITH PROPOFOL N/A 08/02/2015   Procedure: ESOPHAGOGASTRODUODENOSCOPY (EGD) WITH PROPOFOL/ possible dilation.;  Surgeon: [name], MD;  Location: [facility];  Service: Endoscopy;  Laterality: N/A;  . KNEE SURGERY     right knee orthoscopic  . PROSTATE BIOPSY  12/06/13   Gleason 4+3=7, volume 35 gm  . [place]  . TONSILLECTOMY      Family History  Problem Relation Age of Onset  . Heart disease Mother        before age 63  . Diabetes Mother   . Hyperlipidemia Mother   . Varicose Veins Mother   . Heart disease Father   . Diabetes Father   . Cancer Neg Hx     Social History:  reports that he quit smoking about 5 years ago. His smoking use included cigarettes. He has a 11.50 pack-year smoking history. He has never used smokeless tobacco. He reports that he drinks alcohol. He reports that he does not use drugs.    Review of Systems        Lipids: he has had significant hyperlipidemia and has been treated with Lipitor 40 mg daily and is also taking Vascepa for triglycerides Has no history of CAD His diet is relatively low in fat  Continues to have low HDL Triglycerides are slightly high and also now LDL is above 100  To be seen by PCP for follow-up of lipids this month       Lab Results  Component Value Date   CHOL 169 04/28/2018   CHOL 153 10/29/2017   CHOL 167  08/03/2017   Lab Results  Component Value Date   HDL 32.20 (L) 04/28/2018   HDL 30.40 (L) 10/29/2017   HDL 32.00 (L) 08/03/2017   Lab Results  Component Value Date   LDLCALC 104 (H) 04/28/2018   LDLCALC 92 10/29/2017   LDLCALC 102 (H) 08/03/2017   Lab Results  Component Value Date   TRIG 166.0 (H) 04/28/2018   TRIG 152.0 (H) 10/29/2017   TRIG 164.0 (H) 08/03/2017   Lab Results  Component Value Date   CHOLHDL 5 04/28/2018   CHOLHDL 5 10/29/2017   CHOLHDL 5 08/03/2017   Lab Results  Component Value Date   LDLDIRECT 93.0 12/30/2016   LDLDIRECT 103.0 02/08/2015                   The blood pressure has been treated with losartan 100 mg and low-dose metoprolol, followed by PCP and cardiologist Also recently on Lasix for edema   BP Readings from Last 3 Encounters:  05/05/18 112/66  04/22/18 126/68  04/20/18 123/80    RENAL function is worse with recently starting Lasix  Lab Results  Component Value Date   CREATININE 1.60 (H) 04/28/2018   CREATININE 1.46 (H) 04/20/2018   CREATININE 1.23 04/16/2018    Persistent atrial fibrillation: He is concerned about having cardioversion which has been discussed and he has questions about this  LABS:  No visits with results within 1 Week(s) from this visit.  Latest known visit with results is:  Lab on 04/28/2018  Component Date Value Ref Range Status  . Sodium 04/28/2018 137  135 - 145 mEq/L Final  . Potassium 04/28/2018 4.8  3.5 - 5.1 mEq/L Final  . Chloride 04/28/2018 103  96 - 112 mEq/L Final  . CO2 04/28/2018 25  19 - 32 mEq/L Final  . Glucose, Bld 04/28/2018 147* 70 - 99 mg/dL Final  . BUN 04/28/2018 35* 6 - 23 mg/dL Final  . Creatinine, Ser 04/28/2018 1.60* 0.40 - 1.50 mg/dL  Final  . Total Bilirubin 04/28/2018 0.7  0.2 - 1.2 mg/dL Final  . Alkaline Phosphatase 04/28/2018 63  39 - 117 U/L Final  . AST 04/28/2018 15  0 - 37 U/L Final  . ALT 04/28/2018 18  0 - 53 U/L Final  . Total Protein 04/28/2018 7.5  6.0 - 8.3  g/dL Final  . Albumin 04/28/2018 4.3  3.5 - 5.2 g/dL Final  . Calcium 04/28/2018 9.5  8.4 - 10.5 mg/dL Final  . GFR 04/28/2018 46.25* >60.00 mL/min Final  . Cholesterol 04/28/2018 169  0 - 200 mg/dL Final   ATP III Classification       Desirable:  < 200 mg/dL               Borderline High:  200 - 239 mg/dL          High:  > = 240 mg/dL  . Triglycerides 04/28/2018 166.0* 0.0 - 149.0 mg/dL Final   Normal:  <150 mg/dLBorderline High:  150 - 199 mg/dL  . HDL 04/28/2018 32.20* >39.00 mg/dL Final  . VLDL 04/28/2018 33.2  0.0 - 40.0 mg/dL Final  . LDL Cholesterol 04/28/2018 104* 0 - 99 mg/dL Final  . Total CHOL/HDL Ratio 04/28/2018 5   Final                  Men          Women1/2 Average Risk     3.4          3.3Average Risk          5.0          4.42X Average Risk          9.6          7.13X Average Risk          15.0          11.0                      . NonHDL 04/28/2018 137.01   Final   NOTE:  Non-HDL goal should be 30 mg/dL higher than patient's LDL goal (i.e. LDL goal of < 70 mg/dL, would have non-HDL goal of < 100 mg/dL)  . Microalb, Ur 04/28/2018 2.6* 0.0 - 1.9 mg/dL Final  . Creatinine,U 04/28/2018 78.7  mg/dL Final  . Microalb Creat Ratio 04/28/2018 3.3  0.0 - 30.0 mg/g Final  . Hgb A1c MFr Bld 04/28/2018 7.5* 4.6 - 6.5 % Final   Glycemic Control Guidelines for People with Diabetes:Non Diabetic:  <6%Goal of Therapy: <7%Additional Action Suggested:  >8%     Physical Examination:  BP 112/66   Pulse (!) 56   Ht _0  (1.803 m)   Wt (!) 311 lb 6.4 oz (141.3 kg)   SpO2 95%   BMI 43.43 kg/m   Diabetes type 2, morbidly obese, non-insulin-dependent  See history of present illness for detailed discussion of his current management, blood sugar patterns and problems identified   His A1c is still relatively high at 7.5  Also continues to have difficulty losing weight Most of his high reading of postprandially with either high carbohydrate or increased portions at time Fasting reading  may be rarely high also and averaging 120 He has had several intercurrent medical problems which may be playing a role and also new medications Have explained that he will sugar-free desserts may have high glycemic index with carbohydrate or fat content He has some limited exercise but can  do better  Currently because of impaired renal function his Invokana is likely not being effective   HYPERTENSION: Blood pressure is low normal especially with the increase diuresis  Renal dysfunction: Likely from prerenal azotemia and lower blood pressure along with using Lasix for edema  Mixed dyslipidemia: He has still relatively high LDL/low HDL and may do better with Crestor compared to Lipitor, will defer to PCP    PLAN:  Discussed injection technique with Ozempic and to leave the needle in place after finishing injection for at least 5 to 10 seconds Cut back on total carbohydrate intake and watch carbohydrate/fat content of sugar-free desserts Discussed needing consultation with dietitian but he wants to wait Consistent exercise as able to  Since his Invokana efficacy is being impaired by renal function will have him try Lasix every other day Also meanwhile reduce LOSARTAN to 1/2 tablet until renal function improves If renal function not improving will need to reduce dose of Invokana  To discuss lipids with PCP Reassured him about the safety of cardioversion   Patient Instructions  Keep needle in skin for 5 sec after plunger all the way for Ozempic  Lasix every 2 days  Take 1/2 Losartan daily  Check blood sugars on waking up  2/7  Also check blood sugars about 2 hours after a meal and do this after different meals by rotation  Recommended blood sugar levels on waking up is 90-130 and about 2 hours after meal is 130-160  Please bring your blood sugar monitor to each visit, thank you  Low fat meals       Total visit time for evaluation and management of multiple problems and  counseling =25 minutes   Elayne Snare 05/05/2018, 8:58 PM   Note: This office note was prepared with Dragon voice recognition system technology. Any transcriptional errors that result from this process are unintentional.

## 2018-05-05 ENCOUNTER — Ambulatory Visit (INDEPENDENT_AMBULATORY_CARE_PROVIDER_SITE_OTHER): Payer: BLUE CROSS/BLUE SHIELD | Admitting: Endocrinology

## 2018-05-05 ENCOUNTER — Encounter: Payer: Self-pay | Admitting: Endocrinology

## 2018-05-05 VITALS — BP 112/66 | HR 56 | Ht 71.0 in | Wt 311.4 lb

## 2018-05-05 DIAGNOSIS — E1165 Type 2 diabetes mellitus with hyperglycemia: Secondary | ICD-10-CM | POA: Diagnosis not present

## 2018-05-05 DIAGNOSIS — E782 Mixed hyperlipidemia: Secondary | ICD-10-CM | POA: Diagnosis not present

## 2018-05-05 DIAGNOSIS — N289 Disorder of kidney and ureter, unspecified: Secondary | ICD-10-CM | POA: Diagnosis not present

## 2018-05-05 NOTE — Patient Instructions (Addendum)
Keep needle in skin for 5 sec after plunger all the way for Ozempic  Lasix every 2 days  Take 1/2 Losartan daily  Check blood sugars on waking up  2/7  Also check blood sugars about 2 hours after a meal and do this after different meals by rotation  Recommended blood sugar levels on waking up is 90-130 and about 2 hours after meal is 130-160  Please bring your blood sugar monitor to each visit, thank you  Low fat meals

## 2018-05-06 ENCOUNTER — Ambulatory Visit (HOSPITAL_COMMUNITY)
Admission: RE | Admit: 2018-05-06 | Discharge: 2018-05-06 | Disposition: A | Discharge status: Home / Self Care | Payer: BLUE CROSS/BLUE SHIELD | Source: Ambulatory Visit | Attending: Nurse Practitioner | Admitting: Nurse Practitioner

## 2018-05-06 ENCOUNTER — Encounter (HOSPITAL_COMMUNITY): Payer: Self-pay | Admitting: Nurse Practitioner

## 2018-05-06 VITALS — BP 122/78 | HR 87 | Ht 71.0 in | Wt 312.0 lb

## 2018-05-06 DIAGNOSIS — Z87891 Personal history of nicotine dependence: Secondary | ICD-10-CM | POA: Insufficient documentation

## 2018-05-06 DIAGNOSIS — E7800 Pure hypercholesterolemia, unspecified: Secondary | ICD-10-CM | POA: Insufficient documentation

## 2018-05-06 DIAGNOSIS — Z7901 Long term (current) use of anticoagulants: Secondary | ICD-10-CM | POA: Diagnosis not present

## 2018-05-06 DIAGNOSIS — K219 Gastro-esophageal reflux disease without esophagitis: Secondary | ICD-10-CM | POA: Diagnosis not present

## 2018-05-06 DIAGNOSIS — Z79899 Other long term (current) drug therapy: Secondary | ICD-10-CM | POA: Diagnosis not present

## 2018-05-06 DIAGNOSIS — I10 Essential (primary) hypertension: Secondary | ICD-10-CM | POA: Insufficient documentation

## 2018-05-06 DIAGNOSIS — E119 Type 2 diabetes mellitus without complications: Secondary | ICD-10-CM | POA: Diagnosis not present

## 2018-05-06 DIAGNOSIS — Z7984 Long term (current) use of oral hypoglycemic drugs: Secondary | ICD-10-CM | POA: Diagnosis not present

## 2018-05-06 DIAGNOSIS — I481 Persistent atrial fibrillation: Secondary | ICD-10-CM | POA: Diagnosis not present

## 2018-05-06 DIAGNOSIS — I4819 Other persistent atrial fibrillation: Secondary | ICD-10-CM

## 2018-05-06 DIAGNOSIS — Z6841 Body Mass Index (BMI) 40.0 and over, adult: Secondary | ICD-10-CM | POA: Insufficient documentation

## 2018-05-06 DIAGNOSIS — I480 Paroxysmal atrial fibrillation: Secondary | ICD-10-CM | POA: Insufficient documentation

## 2018-05-06 DIAGNOSIS — Z8546 Personal history of malignant neoplasm of prostate: Secondary | ICD-10-CM | POA: Diagnosis not present

## 2018-05-06 DIAGNOSIS — Z86718 Personal history of other venous thrombosis and embolism: Secondary | ICD-10-CM | POA: Insufficient documentation

## 2018-05-06 LAB — CBC
HCT: 48 % (ref 39.0–52.0)
Hemoglobin: 15.5 g/dL (ref 13.0–17.0)
MCH: 27.6 pg (ref 26.0–34.0)
MCHC: 32.3 g/dL (ref 30.0–36.0)
MCV: 85.4 fL (ref 78.0–100.0)
Platelets: 240 10*3/uL (ref 150–400)
RBC: 5.62 MIL/uL (ref 4.22–5.81)
RDW: 15.2 % (ref 11.5–15.5)
WBC: 7.3 10*3/uL (ref 4.0–10.5)

## 2018-05-06 NOTE — H&P (View-Only) (Signed)
Primary Care Physician: Wendie Agreste, MD Referring Physician: Dr. Oval Linsey Cardiologist: Dr. Lorenda Peck is a 67 y.o. male with a h/o morbid obesity, DM. S/p prostate CA, SVT,HTN, that is in the afib clinic for afib that was found at PCP office 8/19. He was c/o of shortness of breath and LLE since taking Lupron shot. He was found to be in afib with RVR. He has been on xarelto  for h/o DVT for some time. He was last seen by Dr. Claiborne Billings in 2017 after a bout of afib. Pt converted spontaneously. On PCP visit, BB was increased, echo ordered. He is now in rate controlled afib and he does not notice any longer He was surprised to find he is still in afib.[name] He is not sure if he is persistent or paroxysmal. He feels at baseline today. He states that he  had a positive sleep study in past but  I can not find the order, and he couldn't  tolerate mask. No excessive caffeine or alcohol, no tobacco. Is sedentary.  F/u 9/5, a 2 week monitor showed that pt is persistently in afib. He states that he is still fairly asymptomatic but slightly winded with exertion on first starting walking. DCCV discussed with pt and he would like to purse as he has a trip to [place] planned toward the end of the month.  No missed doses of xarelto.  Today, he denies symptoms of palpitations, chest pain, shortness of breath, orthopnea, PND, lower extremity edema, dizziness, presyncope, syncope, or neurologic sequela. The patient is tolerating medications without difficulties and is otherwise without complaint today.   Past Medical History:  Diagnosis Date  . Diabetes mellitus without complication ([facility])   . DVT (deep venous thrombosis) ([facility])    left leg.  [name] GERD (gastroesophageal reflux disease)   . Hypercholesterolemia   . Hypertension   . Prostate cancer ([place]) 12/06/13   gleason 4+3=7, 6/12 cores positive. seed implant and radiation   Past Surgical History:  Procedure Laterality Date  . APPENDECTOMY    .  BALLOON DILATION N/A 08/02/2015   Procedure: BALLOON DILATION;  Surgeon: [name], MD;  Location: [facility];  Service: Endoscopy;  Laterality: N/A;  . COLONOSCOPY WITH PROPOFOL N/A 08/02/2015   Procedure: COLONOSCOPY WITH PROPOFOL w/ APC;  Surgeon: [name], MD;  Location: [facility];  Service: Endoscopy;  Laterality: N/A;  . ESOPHAGOGASTRODUODENOSCOPY (EGD) WITH PROPOFOL N/A 08/02/2015   Procedure: ESOPHAGOGASTRODUODENOSCOPY (EGD) WITH PROPOFOL/ possible dilation.;  Surgeon: [name], MD;  Location: [facility];  Service: Endoscopy;  Laterality: N/A;  . KNEE SURGERY     right knee orthoscopic  . PROSTATE BIOPSY  12/06/13   Gleason 4+3=7, volume 35 gm  . [place]  . TONSILLECTOMY      Current Outpatient Medications  Medication Sig Dispense Refill  . amLODipine (NORVASC) 2.5 MG tablet Take 1 tablet (2.5 mg total) by mouth daily. (Patient taking differently: Take 2.5 mg by mouth daily. ) 90 tablet 1  . atorvastatin (LIPITOR) 40 MG tablet TAKE ONE TABLET BY MOUTH ONCE DAILY. 90 tablet 1  . blood glucose meter kit and supplies KIT Test blood sugar daily as directed. Dx code: E11.9 1 each 0  . CINNAMON PO Take 1,000 mg by mouth daily.    . furosemide (LASIX) 20 MG tablet Take 1 tablet (20 mg total) by mouth daily. (Patient taking differently: Take 20 mg by  mouth every other day. ) 30 tablet 0  . glipiZIDE (GLUCOTROL XL) 2.5 MG 24 hr tablet TAKE 1 TABLET BY MOUTH ONCE DAILY WITH BREAKFAST 90 tablet 2  . Icosapent Ethyl (VASCEPA) 1 g CAPS Take 2 capsules (2 g total) by mouth 2 (two) times daily. 120 capsule 2  . INVOKAMET XR [phone] MG TB24 TAKE 2 TABLETS DAILY WITH BREAKFAST 180 tablet 3  . leuprolide (LUPRON) 11.25 MG injection Inject 11.25 mg into the muscle every 3 (three) months.    [name] losartan (COZAAR) 100 MG tablet Take 1 tablet (100 mg total) by mouth daily. (Patient taking differently: Take 50 mg by mouth daily. ) 90 tablet 1   . metoprolol tartrate (LOPRESSOR) 50 MG tablet Take 1 tablet (50 mg total) by mouth 2 (two) times daily. 60 tablet 1  . omeprazole (PRILOSEC) 40 MG capsule One tablet daily 90 capsule 3  . ONE TOUCH ULTRA TEST test strip CHECK TWICE A DAY 200 each 1  . rivaroxaban (XARELTO) 20 MG TABS tablet TAKE ONE TABLET BY MOUTH ONCE DAILY WITH SUPPER 90 tablet 1  . Semaglutide (OZEMPIC) 0.25 or 0.5 MG/DOSE SOPN Inject 0.5 mg into the skin once a week. Inject 0.82m once weekly for the first two weeks, and then increase to 0.549mweekly. 3 pen 1  . Vitamin D, Cholecalciferol, 400 UNITS TABS Take 400 Units by mouth daily.     No current facility-administered medications for this encounter.     No Known Allergies  Social History   Socioeconomic History  . Marital status: Married    Spouse name: Not on file  . Number of children: 1  . Years of education: Not on file  . Highest education level: Not on file  Occupational History    Employer: RETIRED  Social Needs  . Financial resource strain: Not on file  . Food insecurity:    Worry: Not on file    Inability: Not on file  . Transportation needs:    Medical: Not on file    Non-medical: Not on file  Tobacco Use  . Smoking status: Former Smoker    Packs/day: 0.50    Years: 23.00    Pack years: 11.50    Types: Cigarettes    Last attempt to quit: 08/01/2012    Years since quitting: 5.7  . Smokeless tobacco: Never Used  Substance and Sexual Activity  . Alcohol use: Yes    Alcohol/week: 0.0 standard drinks    Comment: very seldom, maybe 3x per year  . Drug use: No  . Sexual activity: Yes    Birth control/protection: Abstinence  Lifestyle  . Physical activity:    Days per week: Not on file    Minutes per session: Not on file  . Stress: Not on file  Relationships  . Social connections:    Talks on phone: Not on file    Gets together: Not on file    Attends religious service: Not on file    Active member of club or organization: Not on  file    Attends meetings of clubs or organizations: Not on file    Relationship status: Not on file  . Intimate partner violence:    Fear of current or ex partner: Not on file    Emotionally abused: Not on file    Physically abused: Not on file    Forced sexual activity: Not on file  Other Topics Concern  . 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 Heart disease Mother        before age 84  . Diabetes Mother   . Hyperlipidemia Mother   . Varicose Veins Mother   . Heart disease Father   . Diabetes Father   . Cancer Neg Hx     ROS- All systems are reviewed and negative except as per the HPI above  Physical Exam: Vitals:   05/06/18 0852  BP: 122/78  Pulse: 87  Weight: (!) 141.5 kg  Height: 5' 11" (1.803 m)   Wt Readings from Last 3 Encounters:  05/06/18 (!) 141.5 kg  05/05/18 (!) 141.3 kg  04/22/18 (!) 143.5 kg    Labs: Lab Results  Component Value Date   NA 137 04/28/2018   K 4.8 04/28/2018   CL 103 04/28/2018   CO2 25 04/28/2018   GLUCOSE 147 (H) 04/28/2018   BUN 35 (H) 04/28/2018   CREATININE 1.60 (H) 04/28/2018   CALCIUM 9.5 04/28/2018   MG 2.1 05/12/2017   Lab Results  Component Value Date   INR 1.55 (H) 10/19/2013   Lab Results  Component Value Date   CHOL 169 04/28/2018   HDL 32.20 (L) 04/28/2018   LDLCALC 104 (H) 04/28/2018   TRIG 166.0 (H) 04/28/2018     GEN- The patient is well appearing, alert and oriented x 3 today.   Head- normocephalic, atraumatic Eyes-  Sclera clear, conjunctiva pink Ears- hearing intact Oropharynx- clear Neck- supple, no JVP Lymph- no cervical lymphadenopathy Lungs- Clear to ausculation bilaterally, normal work of  breathing Heart- irregular rate and rhythm, no murmurs, rubs or gallops, PMI not laterally displaced GI- soft, NT, ND, + BS Extremities- no clubbing, cyanosis, or edema MS- no significant deformity or atrophy Skin- no rash or lesion Psych- euthymic mood, full affect Neuro- strength and sensation are intact  EKG-afib at 87 bpm, qrs int 86 ms, qtc 425 ms Echo-  04/28/18 -Study Conclusions  - Left ventricle: The cavity size was normal. Wall thickness was   increased in a pattern of moderate LVH. Systolic function was   normal. The estimated ejection fraction was in the range of 60%   to 65%. Wall motion was normal; there were no regional wall   motion abnormalities. The study is not technically sufficient to   allow evaluation of LV diastolic function. - Left atrium: The atrium was mildly dilated. Volume/bsa, ES   (1-plane Simpson&'s, A4C): 36.2 ml/m^2. Echo- 2016-Study Conclusions  - Left ventricle: The cavity size was normal. Wall thickness was   increased in a pattern of mild LVH. There was mild focal basal   hypertrophy of the septum. Systolic function was normal. The   estimated ejection fraction was in the range of 55% to 60%. Wall   motion was normal; there were no regional wall motion   abnormalities. Doppler parameters are consistent with abnormal   left ventricular relaxation (grade 1 diastolic dysfunction).  Impressions:  - Normal LV function; grade 1 diastolic dysfunction.   Assessment and Plan: 1. Paroxysmal afib  2 week monitor reviewed and shows persistent afib DCCV discussed with pt and he would like to pursue Risk vrs benefit of  cardioversion discussed with pt and he would like to pursue He is on xarelto 20 mg for a CHA2DS2VASc score of at least 5, states no missed doses  2. BMI of 44.13 Regular exercise and weight loss recommended  3. HTN Stable  F/u  in afib clinic after cardioversion  Butch Penny C. Carroll, [facility] [address] [place], [place] [address] [phone]

## 2018-05-06 NOTE — H&P (View-Only) (Signed)
 Primary Care Physician: Greene, Jeffrey R, MD Referring Physician: Dr. Denmark Cardiologist: Dr. Kelly   Aurthur R Petrosky is a 67 y.o. male with a h/o morbid obesity, DM. S/p prostate CA, SVT,HTN, that is in the afib clinic for afib that was found at PCP office 8/19. He was c/o of shortness of breath and LLE since taking Lupron shot. He was found to be in afib with RVR. He has been on xarelto  for h/o DVT for some time. He was last seen by Dr. Kelly in 2017 after a bout of afib. Pt converted spontaneously. On PCP visit, BB was increased, echo ordered. He is now in rate controlled afib and he does not notice any longer He was surprised to find he is still in afib.. He is not sure if he is persistent or paroxysmal. He feels at baseline today. He states that he  had a positive sleep study in past but  I can not find the order, and he couldn't  tolerate mask. No excessive caffeine or alcohol, no tobacco. Is sedentary.  F/u 9/5, a 2 week monitor showed that pt is persistently in afib. He states that he is still fairly asymptomatic but slightly winded with exertion on first starting walking. DCCV discussed with pt and he would like to purse as he has a trip to [place] planned toward the end of the month.  No missed doses of xarelto.  Today, he denies symptoms of palpitations, chest pain, shortness of breath, orthopnea, PND, lower extremity edema, dizziness, presyncope, syncope, or neurologic sequela. The patient is tolerating medications without difficulties and is otherwise without complaint today.   Past Medical History:  Diagnosis Date  . Diabetes mellitus without complication ([facility])   . DVT (deep venous thrombosis) ([facility])    left leg.  . GERD (gastroesophageal reflux disease)   . Hypercholesterolemia   . Hypertension   . Prostate cancer ([facility]) 12/06/13   gleason 4+3=7, 6/12 cores positive. seed implant and radiation   Past Surgical History:  Procedure Laterality Date  . APPENDECTOMY    .  BALLOON DILATION N/A 08/02/2015   Procedure: BALLOON DILATION;  Surgeon: Daniel P Jacobs, MD;  Location: [facility];  Service: Endoscopy;  Laterality: N/A;  . COLONOSCOPY WITH PROPOFOL N/A 08/02/2015   Procedure: COLONOSCOPY WITH PROPOFOL w/ APC;  Surgeon: Daniel P Jacobs, MD;  Location[facility];  Service: Endoscopy;  Laterality: N/A;  . ESOPHAGOGASTRODUODENOSCOPY (EGD) WITH PROPOFOL N/A 08/02/2015   Procedure: ESOPHAGOGASTRODUODENOSCOPY (EGD) WITH PROPOFOL/ possible dilation.;  Surgeon: Daniel P Jacobs, MD;  Location: [facility];  Service: Endoscopy;  Laterality: N/A;  . KNEE SURGERY     right knee orthoscopic  . PROSTATE BIOPSY  12/06/13   Gleason 4+3=7, volume 35 gm  . SPINE SURGERY   1964 and 1967   Tourette'ssyndrome  . TONSILLECTOMY      Current Outpatient Medications  Medication Sig Dispense Refill  . amLODipine (NORVASC) 2.5 MG tablet Take 1 tablet (2.5 mg total) by mouth daily. (Patient taking differently: Take 2.5 mg by mouth daily. ) 90 tablet 1  . atorvastatin (LIPITOR) 40 MG tablet TAKE ONE TABLET BY MOUTH ONCE DAILY. 90 tablet 1  . blood glucose meter kit and supplies KIT Test blood sugar daily as directed. Dx code: E11.9 1 each 0  . CINNAMON PO Take 1,000 mg by mouth daily.    . furosemide (LASIX) 20 MG tablet Take 1 tablet (20 mg total) by mouth daily. (Patient taking differently: Take 20 mg by   mouth every other day. ) 30 tablet 0  . glipiZIDE (GLUCOTROL XL) 2.5 MG 24 hr tablet TAKE 1 TABLET BY MOUTH ONCE DAILY WITH BREAKFAST 90 tablet 2  . Icosapent Ethyl (VASCEPA) 1 g CAPS Take 2 capsules (2 g total) by mouth 2 (two) times daily. 120 capsule 2  . INVOKAMET XR [phone] MG TB24 TAKE 2 TABLETS DAILY WITH BREAKFAST 180 tablet 3  . leuprolide (LUPRON) 11.25 MG injection Inject 11.25 mg into the muscle every 3 (three) months.    [name] losartan (COZAAR) 100 MG tablet Take 1 tablet (100 mg total) by mouth daily. (Patient taking differently: Take 50 mg by mouth daily. ) 90 tablet 1   . metoprolol tartrate (LOPRESSOR) 50 MG tablet Take 1 tablet (50 mg total) by mouth 2 (two) times daily. 60 tablet 1  . omeprazole (PRILOSEC) 40 MG capsule One tablet daily 90 capsule 3  . ONE TOUCH ULTRA TEST test strip CHECK TWICE A DAY 200 each 1  . rivaroxaban (XARELTO) 20 MG TABS tablet TAKE ONE TABLET BY MOUTH ONCE DAILY WITH SUPPER 90 tablet 1  . Semaglutide (OZEMPIC) 0.25 or 0.5 MG/DOSE SOPN Inject 0.5 mg into the skin once a week. Inject 0.41m once weekly for the first two weeks, and then increase to 0.562mweekly. 3 pen 1  . Vitamin D, Cholecalciferol, 400 UNITS TABS Take 400 Units by mouth daily.     No current facility-administered medications for this encounter.     No Known Allergies  Social History   Socioeconomic History  . Marital status: Married    Spouse name: Not on file  . Number of children: 1  . Years of education: Not on file  . Highest education level: Not on file  Occupational History    Employer: RETIRED  Social Needs  . Financial resource strain: Not on file  . Food insecurity:    Worry: Not on file    Inability: Not on file  . Transportation needs:    Medical: Not on file    Non-medical: Not on file  Tobacco Use  . Smoking status: Former Smoker    Packs/day: 0.50    Years: 23.00    Pack years: 11.50    Types: Cigarettes    Last attempt to quit: 08/01/2012    Years since quitting: 5.7  . Smokeless tobacco: Never Used  Substance and Sexual Activity  . Alcohol use: Yes    Alcohol/week: 0.0 standard drinks    Comment: very seldom, maybe 3x per year  . Drug use: No  . Sexual activity: Yes    Birth control/protection: Abstinence  Lifestyle  . Physical activity:    Days per week: Not on file    Minutes per session: Not on file  . Stress: Not on file  Relationships  . Social connections:    Talks on phone: Not on file    Gets together: Not on file    Attends religious service: Not on file    Active member of club or organization: Not on  file    Attends meetings of clubs or organizations: Not on file    Relationship status: Not on file  . Intimate partner violence:    Fear of current or ex partner: Not on file    Emotionally abused: Not on file    Physically abused: Not on file    Forced sexual activity: Not on file  Other Topics Concern  . 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 Heart disease Mother        before age 60  . Diabetes Mother   . Hyperlipidemia Mother   . Varicose Veins Mother   . Heart disease Father   . Diabetes Father   . Cancer Neg Hx     ROS- All systems are reviewed and negative except as per the HPI above  Physical Exam: Vitals:   05/06/18 0852  BP: 122/78  Pulse: 87  Weight: (!) 141.5 kg  Height: 5' 11" (1.803 m)   Wt Readings from Last 3 Encounters:  05/06/18 (!) 141.5 kg  05/05/18 (!) 141.3 kg  04/22/18 (!) 143.5 kg    Labs: Lab Results  Component Value Date   NA 137 04/28/2018   K 4.8 04/28/2018   CL 103 04/28/2018   CO2 25 04/28/2018   GLUCOSE 147 (H) 04/28/2018   BUN 35 (H) 04/28/2018   CREATININE 1.60 (H) 04/28/2018   CALCIUM 9.5 04/28/2018   MG 2.1 05/12/2017   Lab Results  Component Value Date   INR 1.55 (H) 10/19/2013   Lab Results  Component Value Date   CHOL 169 04/28/2018   HDL 32.20 (L) 04/28/2018   LDLCALC 104 (H) 04/28/2018   TRIG 166.0 (H) 04/28/2018     GEN- The patient is well appearing, alert and oriented x 3 today.   Head- normocephalic, atraumatic Eyes-  Sclera clear, conjunctiva pink Ears- hearing intact Oropharynx- clear Neck- supple, no JVP Lymph- no cervical lymphadenopathy Lungs- Clear to ausculation bilaterally, normal work of  breathing Heart- irregular rate and rhythm, no murmurs, rubs or gallops, PMI not laterally displaced GI- soft, NT, ND, + BS Extremities- no clubbing, cyanosis, or edema MS- no significant deformity or atrophy Skin- no rash or lesion Psych- euthymic mood, full affect Neuro- strength and sensation are intact  EKG-afib at 87 bpm, qrs int 86 ms, qtc 425 ms Echo-  04/28/18 -Study Conclusions  - Left ventricle: The cavity size was normal. Wall thickness was   increased in a pattern of moderate LVH. Systolic function was   normal. The estimated ejection fraction was in the range of 60%   to 65%. Wall motion was normal; there were no regional wall   motion abnormalities. The study is not technically sufficient to   allow evaluation of LV diastolic function. - Left atrium: The atrium was mildly dilated. Volume/bsa, ES   (1-plane Simpson&'s, A4C): 36.2 ml/m^2. Echo- 2016-Study Conclusions  - Left ventricle: The cavity size was normal. Wall thickness was   increased in a pattern of mild LVH. There was mild focal basal   hypertrophy of the septum. Systolic function was normal. The   estimated ejection fraction was in the range of 55% to 60%. Wall   motion was normal; there were no regional wall motion   abnormalities. Doppler parameters are consistent with abnormal   left ventricular relaxation (grade 1 diastolic dysfunction).  Impressions:  - Normal LV function; grade 1 diastolic dysfunction.   Assessment and Plan: 1. Paroxysmal afib  2 week monitor reviewed and shows persistent afib DCCV discussed with pt and he would like to pursue Risk vrs benefit of   cardioversion discussed with pt and he would like to pursue He is on xarelto 20 mg for a CHA2DS2VASc score of at least 5, states no missed doses  2. BMI of 44.13 Regular exercise and weight loss recommended  3. HTN Stable  F/u  in afib clinic after cardioversion  Shia Delaine C. Izabella Marcantel, ANP-C [facility] [address] [place], [place] [address] [phone]       

## 2018-05-06 NOTE — Patient Instructions (Signed)
Cardioversion scheduled for Thursday, September 12th  - Arrive at the [facility] and go to admitting at 11:30am  -Do not eat or drink anything after midnight the night prior to your procedure.  - Take all your morning medication except diabetes medication with a sip of water prior to arrival.  - You will not be able to drive home after your procedure.

## 2018-05-06 NOTE — Progress Notes (Signed)
 Primary Care Physician: Greene, Jeffrey R, MD Referring Physician: Dr. Garden Cardiologist: Dr. Kelly   Tommy Yang is a 67 y.o. male with a h/o morbid obesity, DM. S/p prostate CA, SVT,HTN, that is in the afib clinic for afib that was found at PCP office 8/19. He was c/o of shortness of breath and LLE since taking Lupron shot. He was found to be in afib with RVR. He has been on xarelto  for h/o DVT for some time. He was last seen by Dr. Kelly in 2017 after a bout of afib. Pt converted spontaneously. On PCP visit, BB was increased, echo ordered. He is now in rate controlled afib and he does not notice any longer He was surprised to find he is still in afib.. He is not sure if he is persistent or paroxysmal. He feels at baseline today. He states that he  had a positive sleep study in past but  I can not find the order, and he couldn't  tolerate mask. No excessive caffeine or alcohol, no tobacco. Is sedentary.  F/u 9/5, a 2 week monitor showed that pt is persistently in afib. He states that he is still fairly asymptomatic but slightly winded with exertion on first starting walking. DCCV discussed with pt and he would like to purse as he has a trip to [place] planned toward the end of the month.  No missed doses of xarelto.  Today, he denies symptoms of palpitations, chest pain, shortness of breath, orthopnea, PND, lower extremity edema, dizziness, presyncope, syncope, or neurologic sequela. The patient is tolerating medications without difficulties and is otherwise without complaint today.   Past Medical History:  Diagnosis Date  . Diabetes mellitus without complication (HCC)   . DVT (deep venous thrombosis) (HCC)    left leg.  . GERD (gastroesophageal reflux disease)   . Hypercholesterolemia   . Hypertension   . Prostate cancer (HCC) 12/06/13   gleason 4+3=7, 6/12 cores positive. seed implant and radiation   Past Surgical History:  Procedure Laterality Date  . APPENDECTOMY    .  BALLOON DILATION N/A 08/02/2015   Procedure: BALLOON DILATION;  Surgeon: Daniel P Jacobs, MD;  Location: [facility];  Service: Endoscopy;  Laterality: N/A;  . COLONOSCOPY WITH PROPOFOL N/A 08/02/2015   Procedure: COLONOSCOPY WITH PROPOFOL w/ APC;  Surgeon: Daniel P Jacobs, MD;  Location: [facility];  Service: Endoscopy;  Laterality: N/A;  . ESOPHAGOGASTRODUODENOSCOPY (EGD) WITH PROPOFOL N/A 08/02/2015   Procedure: ESOPHAGOGASTRODUODENOSCOPY (EGD) WITH PROPOFOL/ possible dilation.;  Surgeon: Daniel P Jacobs, MD;  Location: [facility];  Service: Endoscopy;  Laterality: N/A;  . KNEE SURGERY     right knee orthoscopic  . PROSTATE BIOPSY  12/06/13   Gleason 4+3=7, volume 35 gm  . SPINE SURGERY   1964 and 1967   Tourette'ssyndrome  . TONSILLECTOMY      Current Outpatient Medications  Medication Sig Dispense Refill  . amLODipine (NORVASC) 2.5 MG tablet Take 1 tablet (2.5 mg total) by mouth daily. (Patient taking differently: Take 2.5 mg by mouth daily. ) 90 tablet 1  . atorvastatin (LIPITOR) 40 MG tablet TAKE ONE TABLET BY MOUTH ONCE DAILY. 90 tablet 1  . blood glucose meter kit and supplies KIT Test blood sugar daily as directed. Dx code: E11.9 1 each 0  . CINNAMON PO Take 1,000 mg by mouth daily.    . furosemide (LASIX) 20 MG tablet Take 1 tablet (20 mg total) by mouth daily. (Patient taking differently: Take 20 mg by   mouth every other day. ) 30 tablet 0  . glipiZIDE (GLUCOTROL XL) 2.5 MG 24 hr tablet TAKE 1 TABLET BY MOUTH ONCE DAILY WITH BREAKFAST 90 tablet 2  . Icosapent Ethyl (VASCEPA) 1 g CAPS Take 2 capsules (2 g total) by mouth 2 (two) times daily. 120 capsule 2  . INVOKAMET XR [phone] MG TB24 TAKE 2 TABLETS DAILY WITH BREAKFAST 180 tablet 3  . leuprolide (LUPRON) 11.25 MG injection Inject 11.25 mg into the muscle every 3 (three) months.    [name] losartan (COZAAR) 100 MG tablet Take 1 tablet (100 mg total) by mouth daily. (Patient taking differently: Take 50 mg by mouth daily. ) 90 tablet 1   . metoprolol tartrate (LOPRESSOR) 50 MG tablet Take 1 tablet (50 mg total) by mouth 2 (two) times daily. 60 tablet 1  . omeprazole (PRILOSEC) 40 MG capsule One tablet daily 90 capsule 3  . ONE TOUCH ULTRA TEST test strip CHECK TWICE A DAY 200 each 1  . rivaroxaban (XARELTO) 20 MG TABS tablet TAKE ONE TABLET BY MOUTH ONCE DAILY WITH SUPPER 90 tablet 1  . Semaglutide (OZEMPIC) 0.25 or 0.5 MG/DOSE SOPN Inject 0.5 mg into the skin once a week. Inject 0.41m once weekly for the first two weeks, and then increase to 0.562mweekly. 3 pen 1  . Vitamin D, Cholecalciferol, 400 UNITS TABS Take 400 Units by mouth daily.     No current facility-administered medications for this encounter.     No Known Allergies  Social History   Socioeconomic History  . Marital status: Married    Spouse name: Not on file  . Number of children: 1  . Years of education: Not on file  . Highest education level: Not on file  Occupational History    Employer: RETIRED  Social Needs  . Financial resource strain: Not on file  . Food insecurity:    Worry: Not on file    Inability: Not on file  . Transportation needs:    Medical: Not on file    Non-medical: Not on file  Tobacco Use  . Smoking status: Former Smoker    Packs/day: 0.50    Years: 23.00    Pack years: 11.50    Types: Cigarettes    Last attempt to quit: 08/01/2012    Years since quitting: 5.7  . Smokeless tobacco: Never Used  Substance and Sexual Activity  . Alcohol use: Yes    Alcohol/week: 0.0 standard drinks    Comment: very seldom, maybe 3x per year  . Drug use: No  . Sexual activity: Yes    Birth control/protection: Abstinence  Lifestyle  . Physical activity:    Days per week: Not on file    Minutes per session: Not on file  . Stress: Not on file  Relationships  . Social connections:    Talks on phone: Not on file    Gets together: Not on file    Attends religious service: Not on file    Active member of club or organization: Not on  file    Attends meetings of clubs or organizations: Not on file    Relationship status: Not on file  . Intimate partner violence:    Fear of current or ex partner: Not on file    Emotionally abused: Not on file    Physically abused: Not on file    Forced sexual activity: Not on file  Other Topics Concern  . 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 Heart disease Mother        before age 60  . Diabetes Mother   . Hyperlipidemia Mother   . Varicose Veins Mother   . Heart disease Father   . Diabetes Father   . Cancer Neg Hx     ROS- All systems are reviewed and negative except as per the HPI above  Physical Exam: Vitals:   05/06/18 0852  BP: 122/78  Pulse: 87  Weight: (!) 141.5 kg  Height: 5' 11" (1.803 m)   Wt Readings from Last 3 Encounters:  05/06/18 (!) 141.5 kg  05/05/18 (!) 141.3 kg  04/22/18 (!) 143.5 kg    Labs: Lab Results  Component Value Date   NA 137 04/28/2018   K 4.8 04/28/2018   CL 103 04/28/2018   CO2 25 04/28/2018   GLUCOSE 147 (H) 04/28/2018   BUN 35 (H) 04/28/2018   CREATININE 1.60 (H) 04/28/2018   CALCIUM 9.5 04/28/2018   MG 2.1 05/12/2017   Lab Results  Component Value Date   INR 1.55 (H) 10/19/2013   Lab Results  Component Value Date   CHOL 169 04/28/2018   HDL 32.20 (L) 04/28/2018   LDLCALC 104 (H) 04/28/2018   TRIG 166.0 (H) 04/28/2018     GEN- The patient is well appearing, alert and oriented x 3 today.   Head- normocephalic, atraumatic Eyes-  Sclera clear, conjunctiva pink Ears- hearing intact Oropharynx- clear Neck- supple, no JVP Lymph- no cervical lymphadenopathy Lungs- Clear to ausculation bilaterally, normal work of  breathing Heart- irregular rate and rhythm, no murmurs, rubs or gallops, PMI not laterally displaced GI- soft, NT, ND, + BS Extremities- no clubbing, cyanosis, or edema MS- no significant deformity or atrophy Skin- no rash or lesion Psych- euthymic mood, full affect Neuro- strength and sensation are intact  EKG-afib at 87 bpm, qrs int 86 ms, qtc 425 ms Echo-  04/28/18 -Study Conclusions  - Left ventricle: The cavity size was normal. Wall thickness was   increased in a pattern of moderate LVH. Systolic function was   normal. The estimated ejection fraction was in the range of 60%   to 65%. Wall motion was normal; there were no regional wall   motion abnormalities. The study is not technically sufficient to   allow evaluation of LV diastolic function. - Left atrium: The atrium was mildly dilated. Volume/bsa, ES   (1-plane Simpson&'s, A4C): 36.2 ml/m^2. Echo- 2016-Study Conclusions  - Left ventricle: The cavity size was normal. Wall thickness was   increased in a pattern of mild LVH. There was mild focal basal   hypertrophy of the septum. Systolic function was normal. The   estimated ejection fraction was in the range of 55% to 60%. Wall   motion was normal; there were no regional wall motion   abnormalities. Doppler parameters are consistent with abnormal   left ventricular relaxation (grade 1 diastolic dysfunction).  Impressions:  - Normal LV function; grade 1 diastolic dysfunction.   Assessment and Plan: 1. Paroxysmal afib  2 week monitor reviewed and shows persistent afib DCCV discussed with pt and he would like to pursue Risk vrs benefit of   cardioversion discussed with pt and he would like to pursue He is on xarelto 20 mg for a CHA2DS2VASc score of at least 5, states no missed doses  2. BMI of 44.13 Regular exercise and weight loss recommended  3. HTN Stable  F/u  in afib clinic after cardioversion  Satvik Parco C. Saliyah Gillin, ANP-C [facility] [address] [place], [place] [address] [phone]       

## 2018-05-11 ENCOUNTER — Other Ambulatory Visit: Payer: Self-pay | Admitting: Endocrinology

## 2018-05-12 ENCOUNTER — Encounter: Payer: Self-pay | Admitting: Family Medicine

## 2018-05-12 ENCOUNTER — Other Ambulatory Visit: Payer: Self-pay

## 2018-05-12 ENCOUNTER — Ambulatory Visit (INDEPENDENT_AMBULATORY_CARE_PROVIDER_SITE_OTHER): Payer: BLUE CROSS/BLUE SHIELD | Admitting: Family Medicine

## 2018-05-12 VITALS — BP 130/80 | HR 81 | Temp 98.4°F | Ht 71.0 in | Wt 313.0 lb

## 2018-05-12 DIAGNOSIS — R7989 Other specified abnormal findings of blood chemistry: Secondary | ICD-10-CM | POA: Diagnosis not present

## 2018-05-12 DIAGNOSIS — R12 Heartburn: Secondary | ICD-10-CM | POA: Diagnosis not present

## 2018-05-12 DIAGNOSIS — I10 Essential (primary) hypertension: Secondary | ICD-10-CM

## 2018-05-12 DIAGNOSIS — R609 Edema, unspecified: Secondary | ICD-10-CM

## 2018-05-12 DIAGNOSIS — I509 Heart failure, unspecified: Secondary | ICD-10-CM

## 2018-05-12 DIAGNOSIS — J310 Chronic rhinitis: Secondary | ICD-10-CM

## 2018-05-12 DIAGNOSIS — I825Z9 Chronic embolism and thrombosis of unspecified deep veins of unspecified distal lower extremity: Secondary | ICD-10-CM

## 2018-05-12 DIAGNOSIS — E785 Hyperlipidemia, unspecified: Secondary | ICD-10-CM | POA: Diagnosis not present

## 2018-05-12 DIAGNOSIS — I4891 Unspecified atrial fibrillation: Secondary | ICD-10-CM

## 2018-05-12 LAB — BASIC METABOLIC PANEL
BUN/Creatinine Ratio: 18 (ref 10–24)
BUN: 23 mg/dL (ref 8–27)
CO2: 22 mmol/L (ref 20–29)
Calcium: 9.4 mg/dL (ref 8.6–10.2)
Chloride: 103 mmol/L (ref 96–106)
Creatinine, Ser: 1.3 mg/dL — ABNORMAL HIGH (ref 0.76–1.27)
GFR calc Af Amer: 66 mL/min/{1.73_m2} (ref >59)
GFR calc non Af Amer: 57 mL/min/{1.73_m2} — ABNORMAL LOW (ref >59)
Glucose: 105 mg/dL — ABNORMAL HIGH (ref 65–99)
Potassium: 5 mmol/L (ref 3.5–5.2)
Sodium: 142 mmol/L (ref 134–144)

## 2018-05-12 MED ORDER — RIVAROXABAN 20 MG PO TABS: 20.0000 mg | ORAL_TABLET | Freq: Every day | ORAL | 1 refills | Status: DC

## 2018-05-12 MED ORDER — OMEPRAZOLE 40 MG PO CPDR: DELAYED_RELEASE_CAPSULE | ORAL | 3 refills | Status: DC

## 2018-05-12 MED ORDER — LOSARTAN POTASSIUM 100 MG PO TABS: 50.0000 mg | ORAL_TABLET | Freq: Every day | ORAL | 1 refills | Status: DC

## 2018-05-12 MED ORDER — METOPROLOL TARTRATE 50 MG PO TABS: 50.0000 mg | ORAL_TABLET | Freq: Two times a day (BID) | ORAL | 1 refills | Status: DC

## 2018-05-12 MED ORDER — AMLODIPINE BESYLATE 2.5 MG PO TABS: 2.5000 mg | ORAL_TABLET | Freq: Every day | ORAL | 1 refills | Status: DC

## 2018-05-12 MED ORDER — ATORVASTATIN CALCIUM 40 MG PO TABS: ORAL_TABLET | ORAL | 1 refills | Status: DC

## 2018-05-12 NOTE — Progress Notes (Signed)
Subjective:    Patient ID: Tommy Yang, male    DOB: 30-Jan-1951, 67 y.o.   MRN: [id number]  HPI Tommy Yang is a 67 y.o. male Presents today for: Chief Complaint  Patient presents with  . Chronic condition    6 month f/u with med refills   . blood test on kidney    for Dr.Kumar    Here for follow-up to discuss routine chronic conditions, medication refills.  Most recently seen August for edema, dyspnea after Lupron injection for prostate cancer.  Return of atrial fibrillation and is been followed by cardiology.  Atrial fibrillation: most recent visit with atrial fibrillation clinic on September 5.  He had a 2-week monitor showing persistent A. fib.  He continued on Xarelto (hx of DVT).  DCCV was discussed, and planned tomorrow morning. Continued on same dose of beta-blocker of 50 mg twice daily. Swelling improved after 50mg  metoprolol. Off lasix for 4 days.    Prostate cancer: Recurrent/metastatic prostate cancer.  Receiving Lupron injections, Xtani.  Urologist Dr. Louis Meckel. Has frequent urination - urologist aware, up to once per hour. Has been treated with flomax, and bactrim.   Hypertension: BP Readings from Last 3 Encounters:  05/12/18 130/80  05/06/18 122/78  05/05/18 112/66   Lab Results  Component Value Date   CREATININE 1.60 (H) 04/28/2018  Currently taking Norvasc 2.5 mg daily,  Metoprolol 50 mg twice daily, losartan 50 mg daily.  Diabetes: He is followed by endocrinology, Dr. Dwyane Dee.  He is on Ozempic, Invokamet XR, glipizide.  Hyperlipidemia: Lab Results  Component Value Date   CHOL 169 04/28/2018   HDL 32.20 (L) 04/28/2018   LDLCALC 104 (H) 04/28/2018   LDLDIRECT 93.0 12/30/2016   TRIG 166.0 (H) 04/28/2018   CHOLHDL 5 04/28/2018   Lab Results  Component Value Date   ALT 18 04/28/2018   AST 15 04/28/2018   ALKPHOS 63 04/28/2018   BILITOT 0.7 04/28/2018  He takes Lipitor 40 mg daily, Vascepa. No new side effects.   Elevated  creatinine: Previous range 1.23-1.33, increased to 1.46 on August 20, then 1.60 August 28. No lasix 4 days, metoprolol at higher dose controlled swelling. No NSAIDS.   Uses flonase ns for runny nose.   Heartburn: No hx of ulcers. Takes omeprazole QD. No breakthrough symptoms.   Review of Systems  Constitutional: Negative for fatigue and unexpected weight change.  Eyes: Negative for visual disturbance.  Respiratory: Positive for shortness of breath (minimal with exertion. ). Negative for cough and chest tightness.   Cardiovascular: Negative for chest pain, palpitations and leg swelling.  Gastrointestinal: Negative for abdominal pain and blood in stool.  Neurological: Negative for dizziness, light-headedness and headaches.      Objective:   Physical Exam  Constitutional: He is oriented to person, place, and time. He appears well-developed and well-nourished.  HENT:  Head: Normocephalic and atraumatic.  Eyes: Pupils are equal, round, and reactive to light. EOM are normal.  Neck: No JVD present. Carotid bruit is not present.  Cardiovascular: Normal rate and normal heart sounds.  No murmur heard. Irregularly irregular rhythm  Pulmonary/Chest: Effort normal and breath sounds normal. He has no rales.  Musculoskeletal: He exhibits edema (Slight pedal edema stasis changes.  Nonpitting).  Neurological: He is alert and oriented to person, place, and time.  Skin: Skin is warm and dry.  Psychiatric: He has a normal mood and affect.  Vitals reviewed.  Vitals:   05/12/18 0800  BP: 130/80  Pulse: 81  Temp: 98.4 F (36.9 C)  TempSrc: Oral  SpO2: 95%  Weight: (!) 313 lb (142 kg)  Height: 5\' 11"  (1.803 m)       Assessment & Plan:    [name] is a 67 y.o. male Elevated serum creatinine - Plan: Basic metabolic panel  -Repeat testing, avoid NSAIDs, maintain hydration.  Off Lasix now, may improve.  Appears to have stable fluid status.  Essential hypertension - Plan: Basic  metabolic panel, amLODipine (NORVASC) 2.5 MG tablet, losartan (COZAAR) 100 MG tablet, metoprolol tartrate (LOPRESSOR) 50 MG tablet  -Stable, no med changes at this time.  Labs pending as above  Heartburn - Plan: omeprazole (PRILOSEC) 40 MG capsule  -Stable, option of every other day dosing of omeprazole.  If chronic daily use, would check for nutritional deficiencies  Hyperlipidemia, unspecified hyperlipidemia type - Plan: atorvastatin (LIPITOR) 40 MG tablet   Stable, tolerating current regimen. Medications refilled. Labs pending as above.   Rhinitis, unspecified type  -Stable with use.  Possible vasomotor rhinitis.  Atrial fibrillation, unspecified type ([place]) - Plan: rivaroxaban (XARELTO) 20 MG TABS tablet  -Plan cardioversion, on anticoagulation and rate control.  Elevated brain natriuretic peptide (BNP) level Peripheral edema Congestive heart failure, unspecified HF chronicity, unspecified heart failure type ([place])  -Appears to have stable fluid status, possibly related to prior uncontrolled A. fib, with plan for cardioversion.   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  Continue same dose.  Meds ordered this encounter  Medications  . amLODipine (NORVASC) 2.5 MG tablet    Sig: Take 1 tablet (2.5 mg total) by mouth daily.    Dispense:  90 tablet    Refill:  1  . atorvastatin (LIPITOR) 40 MG tablet    Sig: TAKE ONE TABLET BY MOUTH ONCE DAILY.    Dispense:  90 tablet    Refill:  1  . losartan (COZAAR) 100 MG tablet    Sig: Take 0.5 tablets (50 mg total) by mouth daily.    Dispense:  90 tablet    Refill:  1  . metoprolol tartrate (LOPRESSOR) 50 MG tablet    Sig: Take 1 tablet (50 mg total) by mouth 2 (two) times daily.    Dispense:  180 tablet    Refill:  1  . omeprazole (PRILOSEC) 40 MG capsule    Sig: One tablet daily as  needed    Dispense:  90 capsule    Refill:  3  . rivaroxaban (XARELTO) 20 MG TABS tablet    Sig: Take 1 tablet (20 mg total) by mouth daily with supper.    Dispense:  90 tablet    Refill:  1   Patient Instructions   Contact urologist to discuss frequent urination and plan.  I will recheck kidney function.   Option of every other day for omeprazole - see foods to avoid below.     Food Choices for Gastroesophageal Reflux Disease, Adult When you have gastroesophageal reflux disease (GERD), the foods you eat and your eating habits are very important. Choosing the right foods can help ease your discomfort. What guidelines do I need to follow?  Choose fruits, vegetables, whole grains, and low-fat dairy products.  Choose low-fat meat, fish, and poultry.  Limit fats such as oils, salad dressings, butter, nuts, and avocado.  Keep a food diary. This helps you identify foods that cause symptoms.  Avoid foods that cause symptoms. These may be different for everyone.  Eat small meals often instead of 3 large meals a day.  Eat your meals slowly, in a place where you are relaxed.  Limit fried foods.  Cook foods using methods other than frying.  Avoid drinking alcohol.  Avoid drinking large amounts of liquids with your meals.  Avoid bending over or lying down until 2-3 hours after eating. What foods are not recommended? These are some foods and drinks that may make your symptoms worse: Vegetables Tomatoes. Tomato juice. Tomato and spaghetti sauce. Chili peppers. Onion and garlic. Horseradish. Fruits Oranges, grapefruit, and lemon (fruit and juice). Meats High-fat meats, fish, and poultry. This includes hot dogs, ribs, ham, sausage, salami, and bacon. Dairy Whole milk and chocolate milk. Sour cream. Cream. Butter. Ice cream. Cream cheese. Drinks Coffee and tea. Bubbly (carbonated) drinks or energy drinks. Condiments Hot sauce. Barbecue sauce. Sweets/Desserts Chocolate and  cocoa. Donuts. Peppermint and spearmint. Fats and Oils High-fat foods. This includes [place] fries and potato chips. Other Vinegar. Strong spices. This includes black pepper, white pepper, red pepper, cayenne, curry powder, cloves, ginger, and chili powder. The items listed above may not be a complete list of foods and drinks to avoid. Contact your dietitian for more information. This information is not intended to replace advice given to you by your health care provider. Make sure you discuss any questions you have with your health care provider. Document Released: 02/17/2012 Document Revised: 01/24/2016 Document Reviewed: 06/22/2013 Elsevier Interactive Patient Education  [employer].    If you have lab work done today you will be contacted with your lab results within the next 2 weeks.  If you have not heard from [name] then please contact us. The fastest way to get your results is to register for My Chart.   IF you received an x-ray today, you will receive an invoice from [facility]. Please contact [facility] at [phone] with questions or concerns regarding your invoice.   IF you received labwork today, you will receive an invoice from [facility]. Please contact [facility] at [phone] with questions or concerns regarding your invoice.   Our billing staff will not be able to assist you with questions regarding bills from these companies.  You will be contacted with the lab results as soon as they are available. The fastest way to get your results is to activate your My Chart account. Instructions are located on the last page of this paperwork. If you have not heard from [name] regarding the results in 2 weeks, please contact this office.      Signed,   Merri Ray, MD [facility].  05/13/18 8:11 AM

## 2018-05-12 NOTE — Patient Instructions (Addendum)
Contact urologist to discuss frequent urination and plan.  I will recheck kidney function.   Option of every other day for omeprazole - see foods to avoid below.     Food Choices for Gastroesophageal Reflux Disease, Adult When you have gastroesophageal reflux disease (GERD), the foods you eat and your eating habits are very important. Choosing the right foods can help ease your discomfort. What guidelines do I need to follow?  Choose fruits, vegetables, whole grains, and low-fat dairy products.  Choose low-fat meat, fish, and poultry.  Limit fats such as oils, salad dressings, butter, nuts, and avocado.  Keep a food diary. This helps you identify foods that cause symptoms.  Avoid foods that cause symptoms. These may be different for everyone.  Eat small meals often instead of 3 large meals a day.  Eat your meals slowly, in a place where you are relaxed.  Limit fried foods.  Cook foods using methods other than frying.  Avoid drinking alcohol.  Avoid drinking large amounts of liquids with your meals.  Avoid bending over or lying down until 2-3 hours after eating. What foods are not recommended? These are some foods and drinks that may make your symptoms worse: Vegetables Tomatoes. Tomato juice. Tomato and spaghetti sauce. Chili peppers. Onion and garlic. Horseradish. Fruits Oranges, grapefruit, and lemon (fruit and juice). Meats High-fat meats, fish, and poultry. This includes hot dogs, ribs, ham, sausage, salami, and bacon. Dairy Whole milk and chocolate milk. Sour cream. Cream. Butter. Ice cream. Cream cheese. Drinks Coffee and tea. Bubbly (carbonated) drinks or energy drinks. Condiments Hot sauce. Barbecue sauce. Sweets/Desserts Chocolate and cocoa. Donuts. Peppermint and spearmint. Fats and Oils High-fat foods. This includes [place] fries and potato chips. Other Vinegar. Strong spices. This includes black pepper, white pepper, red pepper, cayenne, curry powder,  cloves, ginger, and chili powder. The items listed above may not be a complete list of foods and drinks to avoid. Contact your dietitian for more information. This information is not intended to replace advice given to you by your health care provider. Make sure you discuss any questions you have with your health care provider. Document Released: 02/17/2012 Document Revised: 01/24/2016 Document Reviewed: 06/22/2013 Elsevier Interactive Patient Education  [employer].    If you have lab work done today you will be contacted with your lab results within the next 2 weeks.  If you have not heard from [name] then please contact us. The fastest way to get your results is to register for My Chart.   IF you received an x-ray today, you will receive an invoice from [facility]. Please contact [facility]iology at [phone] with questions or concerns regarding your invoice.   IF you received labwork today, you will receive an invoice from [place] [facility]. Please contact [facility] at [phone] with questions or concerns regarding your invoice.   Our billing staff will not be able to assist you with questions regarding bills from these companies.  You will be contacted with the lab results as soon as they are available. The fastest way to get your results is to activate your My Chart account. Instructions are located on the last page of this paperwork. If you have not heard from [name] regarding the results in 2 weeks, please contact this office.

## 2018-05-13 ENCOUNTER — Other Ambulatory Visit: Payer: Self-pay

## 2018-05-13 ENCOUNTER — Encounter (HOSPITAL_COMMUNITY): Payer: Self-pay | Admitting: *Deleted

## 2018-05-13 ENCOUNTER — Ambulatory Visit (HOSPITAL_COMMUNITY): Payer: BLUE CROSS/BLUE SHIELD | Admitting: Anesthesiology

## 2018-05-13 ENCOUNTER — Encounter (HOSPITAL_COMMUNITY)
Admission: RE | Disposition: A | Discharge status: Home / Self Care | Payer: Self-pay | Source: Ambulatory Visit | Attending: Cardiovascular Disease

## 2018-05-13 ENCOUNTER — Ambulatory Visit (HOSPITAL_COMMUNITY)
Admission: RE | Admit: 2018-05-13 | Discharge: 2018-05-13 | Disposition: A | Discharge status: Home / Self Care | Payer: BLUE CROSS/BLUE SHIELD | Source: Ambulatory Visit | Attending: Cardiovascular Disease | Admitting: Cardiovascular Disease

## 2018-05-13 DIAGNOSIS — I10 Essential (primary) hypertension: Secondary | ICD-10-CM | POA: Diagnosis not present

## 2018-05-13 DIAGNOSIS — Z8249 Family history of ischemic heart disease and other diseases of the circulatory system: Secondary | ICD-10-CM | POA: Diagnosis not present

## 2018-05-13 DIAGNOSIS — Z7901 Long term (current) use of anticoagulants: Secondary | ICD-10-CM | POA: Insufficient documentation

## 2018-05-13 DIAGNOSIS — Z86718 Personal history of other venous thrombosis and embolism: Secondary | ICD-10-CM | POA: Insufficient documentation

## 2018-05-13 DIAGNOSIS — Z79899 Other long term (current) drug therapy: Secondary | ICD-10-CM | POA: Diagnosis not present

## 2018-05-13 DIAGNOSIS — Z87891 Personal history of nicotine dependence: Secondary | ICD-10-CM | POA: Diagnosis not present

## 2018-05-13 DIAGNOSIS — E7800 Pure hypercholesterolemia, unspecified: Secondary | ICD-10-CM | POA: Insufficient documentation

## 2018-05-13 DIAGNOSIS — E785 Hyperlipidemia, unspecified: Secondary | ICD-10-CM | POA: Diagnosis not present

## 2018-05-13 DIAGNOSIS — I4819 Other persistent atrial fibrillation: Secondary | ICD-10-CM

## 2018-05-13 DIAGNOSIS — Z833 Family history of diabetes mellitus: Secondary | ICD-10-CM | POA: Diagnosis not present

## 2018-05-13 DIAGNOSIS — Z9889 Other specified postprocedural states: Secondary | ICD-10-CM | POA: Insufficient documentation

## 2018-05-13 DIAGNOSIS — K219 Gastro-esophageal reflux disease without esophagitis: Secondary | ICD-10-CM | POA: Insufficient documentation

## 2018-05-13 DIAGNOSIS — I480 Paroxysmal atrial fibrillation: Secondary | ICD-10-CM | POA: Insufficient documentation

## 2018-05-13 DIAGNOSIS — I481 Persistent atrial fibrillation: Secondary | ICD-10-CM

## 2018-05-13 DIAGNOSIS — Z8546 Personal history of malignant neoplasm of prostate: Secondary | ICD-10-CM | POA: Insufficient documentation

## 2018-05-13 DIAGNOSIS — Z6841 Body Mass Index (BMI) 40.0 and over, adult: Secondary | ICD-10-CM | POA: Diagnosis not present

## 2018-05-13 DIAGNOSIS — E119 Type 2 diabetes mellitus without complications: Secondary | ICD-10-CM | POA: Diagnosis not present

## 2018-05-13 DIAGNOSIS — I4891 Unspecified atrial fibrillation: Secondary | ICD-10-CM | POA: Diagnosis not present

## 2018-05-13 DIAGNOSIS — Z7984 Long term (current) use of oral hypoglycemic drugs: Secondary | ICD-10-CM | POA: Insufficient documentation

## 2018-05-13 HISTORY — PX: CARDIOVERSION: SHX1299

## 2018-05-13 LAB — GLUCOSE, CAPILLARY: Glucose-Capillary: 118 mg/dL — ABNORMAL HIGH (ref 70–99)

## 2018-05-13 SURGERY — CARDIOVERSION
Anesthesia: General

## 2018-05-13 MED ORDER — LIDOCAINE 2% (20 MG/ML) 5 ML SYRINGE
INTRAMUSCULAR | Status: DC | PRN
  Administered 2018-05-13: 20 mg via INTRAVENOUS

## 2018-05-13 MED ORDER — PROPOFOL 10 MG/ML IV BOLUS
INTRAVENOUS | Status: DC | PRN
  Administered 2018-05-13: 100 mg via INTRAVENOUS

## 2018-05-13 MED ORDER — SODIUM CHLORIDE 0.9 % IV SOLN
INTRAVENOUS | Status: DC | PRN
  Administered 2018-05-13: 12:00:00 via INTRAVENOUS

## 2018-05-13 MED ORDER — SODIUM CHLORIDE 0.9 % IV SOLN
INTRAVENOUS | Status: AC | PRN
  Administered 2018-05-13: 500 mL via INTRAVENOUS

## 2018-05-13 NOTE — Anesthesia Preprocedure Evaluation (Addendum)
Anesthesia Evaluation  Patient identified by MRN, date of birth, ID band Patient awake    Reviewed: Allergy & Precautions, NPO status , Patient's Chart, lab work & pertinent test results  History of Anesthesia Complications Negative for: history of anesthetic complications  Airway Mallampati: II  TM Distance: >3 FB Neck ROM: Full    Dental  (+) Dental Advisory Given   Pulmonary sleep apnea , former smoker,    breath sounds clear to auscultation       Cardiovascular hypertension, Pt. on medications and Pt. on home beta blockers + Peripheral Vascular Disease and + DVT  + dysrhythmias Atrial Fibrillation  Rhythm:Irregular Rate:Tachycardia   '19 TTE - Moderate LVH. EF 60% to 65%. LA was mildly dilated     Neuro/Psych negative neurological ROS  negative psych ROS   GI/Hepatic Neg liver ROS, GERD  Medicated,  Endo/Other  diabetesMorbid obesity  Renal/GU negative Renal ROS    Prostate cancer     Musculoskeletal negative musculoskeletal ROS (+)   Abdominal (+) + obese,   Peds  Hematology negative hematology ROS (+)   Anesthesia Other Findings   Reproductive/Obstetrics                            Anesthesia Physical Anesthesia Plan  ASA: III  Anesthesia Plan: General   Post-op Pain Management:    Induction: Intravenous  PONV Risk Score and Plan: 2 and Treatment may vary due to age or medical condition and Propofol infusion  Airway Management Planned: Mask and Natural Airway  Additional Equipment: None  Intra-op Plan:   Post-operative Plan:   Informed Consent: I have reviewed the patients History and Physical, chart, labs and discussed the procedure including the risks, benefits and alternatives for the proposed anesthesia with the patient or authorized representative who has indicated his/her understanding and acceptance.   Dental advisory given  Plan Discussed with: CRNA and  Anesthesiologist  Anesthesia Plan Comments:        Anesthesia Quick Evaluation

## 2018-05-13 NOTE — Anesthesia Postprocedure Evaluation (Signed)
Anesthesia Post Note  Patient: Tommy Yang  Procedure(s) Performed: CARDIOVERSION (N/A )     Patient location during evaluation: PACU Anesthesia Type: General Level of consciousness: awake and alert Pain management: pain level controlled Vital Signs Assessment: post-procedure vital signs reviewed and stable Respiratory status: spontaneous breathing, nonlabored ventilation, respiratory function stable and patient connected to nasal cannula oxygen Cardiovascular status: blood pressure returned to baseline and stable Postop Assessment: no apparent nausea or vomiting Anesthetic complications: no    Last Vitals:  Vitals:   05/13/18 1210 05/13/18 1225  BP: 115/74 (!) 127/55  Resp: (!) 21 20  Temp: 36.9 C   SpO2: 99% 98%    Last Pain:  Vitals:   05/13/18 1225  TempSrc:   PainSc: 0-No pain                 [name]

## 2018-05-13 NOTE — Interval H&P Note (Signed)
History and Physical Interval Note:  05/13/2018 11:54 AM  Tommy Yang  has presented today for surgery, with the diagnosis of AFIB  The various methods of treatment have been discussed with the patient and family. After consideration of risks, benefits and other options for treatment, the patient has consented to  Procedure(s): CARDIOVERSION (N/A) as a surgical intervention .  The patient's history has been reviewed, patient examined, no change in status, stable for surgery.  I have reviewed the patient's chart and labs.  Questions were answered to the patient's satisfaction.     Tommy Yang

## 2018-05-13 NOTE — Discharge Instructions (Signed)
Electrical Cardioversion, Care After °This sheet gives you information about how to care for yourself after your procedure. Your health care provider may also give you more specific instructions. If you have problems or questions, contact your health care provider. °What can I expect after the procedure? °After the procedure, it is common to have: °· Some redness on the skin where the shocks were given. ° °Follow these instructions at home: °· Do not drive for 24 hours if you were given a medicine to help you relax (sedative). °· Take over-the-counter and prescription medicines only as told by your health care provider. °· Ask your health care provider how to check your pulse. Check it often. °· Rest for 48 hours after the procedure or as told by your health care provider. °· Avoid or limit your caffeine use as told by your health care provider. °Contact a health care provider if: °· You feel like your heart is beating too quickly or your pulse is not regular. °· You have a serious muscle cramp that does not go away. °Get help right away if: °· You have discomfort in your chest. °· You are dizzy or you feel faint. °· You have trouble breathing or you are short of breath. °· Your speech is slurred. °· You have trouble moving an arm or leg on one side of your body. °· Your fingers or toes turn cold or blue. °This information is not intended to replace advice given to you by your health care provider. Make sure you discuss any questions you have with your health care provider. °Document Released: 06/08/2013 Document Revised: 03/21/2016 Document Reviewed: 02/22/2016 °Elsevier Interactive Patient Education © 2018 [employer] ° °

## 2018-05-13 NOTE — Transfer of Care (Signed)
Immediate Anesthesia Transfer of Care Note  Patient: Tommy Yang  Procedure(s) Performed: CARDIOVERSION (N/A )  Patient Location: Endoscopy Unit  Anesthesia Type:General  Level of Consciousness: awake, alert  and oriented  Airway & Oxygen Therapy: Patient Spontanous Breathing and Patient connected to nasal cannula oxygen  Post-op Assessment: Report given to RN and Post -op Vital signs reviewed and stable  Post vital signs: Reviewed and stable  Last Vitals:  Vitals Value Taken Time  BP    Temp    Pulse    Resp    SpO2      Last Pain:  Vitals:   05/13/18 1154  TempSrc: Oral         Complications: No apparent anesthesia complications

## 2018-05-13 NOTE — Anesthesia Procedure Notes (Signed)
Procedure Name: MAC Date/Time: 05/13/2018 12:10 PM Performed by: Teressa Lower., CRNA Pre-anesthesia Checklist: Patient identified, Emergency Drugs available, Suction available, Patient being monitored and Timeout performed Patient Re-evaluated:Patient Re-evaluated prior to induction Oxygen Delivery Method: Ambu bag and Nasal cannula Preoxygenation: Pre-oxygenation with 100% oxygen

## 2018-05-13 NOTE — Op Note (Signed)
Procedure: Electrical Cardioversion Indications:  Atrial Fibrillation  Procedure Details:  Consent: Risks of procedure as well as the alternatives and risks of each were explained to the (patient/caregiver).  Consent for procedure obtained.  Time Out: Verified patient identification, verified procedure, site/side was marked, verified correct patient position, special equipment/implants available, medications/allergies/relevent history reviewed, required imaging and test results available.  Performed  Patient placed on cardiac monitor, pulse oximetry, supplemental oxygen as necessary.  Sedation given: Dr. Ola Spurr, propofol 100 mg IV Pacer pads placed anterior and posterior chest.  Cardioverted 2 time(s).  Cardioversion with synchronized biphasic 200J shock. First shock failed. Second shock, with weight applied to the anterior pad, was successful.  Evaluation: Findings: Post procedure EKG shows: sinus bradycardia with PACs Complications: None Patient did tolerate procedure well.  Time Spent Directly with the Patient:  30 minutes   Tommy Yang 05/13/2018, 12:08 PM

## 2018-05-14 ENCOUNTER — Telehealth: Payer: Self-pay | Admitting: Family Medicine

## 2018-05-14 NOTE — Telephone Encounter (Signed)
Pt called to check status. Please advise.  °

## 2018-05-14 NOTE — Telephone Encounter (Signed)
Copied from [place] [id number]. Topic: Quick Communication - See Telephone Encounter >> May 14, 2018  2:40 PM Blase Mess A wrote: CRM for notification. See Telephone encounter for: 05/14/18.  Patient called because he has flu shot and shigles.  He is not feeling well.  His stomach is upset.  He would like to know is this common?  Please advise. Patient call back. [phone]

## 2018-05-17 ENCOUNTER — Other Ambulatory Visit (HOSPITAL_COMMUNITY): Payer: Self-pay | Admitting: *Deleted

## 2018-05-17 ENCOUNTER — Telehealth (HOSPITAL_COMMUNITY): Payer: Self-pay | Admitting: *Deleted

## 2018-05-17 NOTE — Telephone Encounter (Signed)
Patient called in stating he felt great after cardioversion went and got the shingles vaccine and came down with flu like symptoms and vomiting on Friday and it threw him back into AF. Pt is leaving to go out of state next week would like to try another cardioversion prior to leaving. Explained to patient if ERAF after this cardioversion will need to talk AAT and patient understands this. Per Roderic Palau NP will bring in for EKG tomorrow prior to cardioversion to confirm continued AF. Pt verbalized understanding.

## 2018-05-18 ENCOUNTER — Ambulatory Visit (HOSPITAL_COMMUNITY): Payer: BLUE CROSS/BLUE SHIELD | Admitting: Certified Registered"

## 2018-05-18 ENCOUNTER — Encounter (HOSPITAL_COMMUNITY)
Admission: RE | Disposition: A | Discharge status: Home / Self Care | Payer: Self-pay | Source: Ambulatory Visit | Attending: Cardiology

## 2018-05-18 ENCOUNTER — Encounter (HOSPITAL_COMMUNITY): Payer: Self-pay | Admitting: Nurse Practitioner

## 2018-05-18 ENCOUNTER — Ambulatory Visit: Payer: BLUE CROSS/BLUE SHIELD | Admitting: Family Medicine

## 2018-05-18 ENCOUNTER — Ambulatory Visit (HOSPITAL_COMMUNITY)
Admission: RE | Admit: 2018-05-18 | Discharge: 2018-05-18 | Disposition: A | Discharge status: Home / Self Care | Payer: BLUE CROSS/BLUE SHIELD | Source: Ambulatory Visit | Attending: Cardiology | Admitting: Cardiology

## 2018-05-18 ENCOUNTER — Encounter (HOSPITAL_COMMUNITY): Payer: Self-pay | Admitting: *Deleted

## 2018-05-18 ENCOUNTER — Ambulatory Visit (HOSPITAL_COMMUNITY)
Admission: RE | Admit: 2018-05-18 | Discharge: 2018-05-18 | Disposition: A | Discharge status: Home / Self Care | Payer: BLUE CROSS/BLUE SHIELD | Source: Ambulatory Visit | Attending: Nurse Practitioner | Admitting: Nurse Practitioner

## 2018-05-18 DIAGNOSIS — Z86718 Personal history of other venous thrombosis and embolism: Secondary | ICD-10-CM | POA: Diagnosis not present

## 2018-05-18 DIAGNOSIS — K219 Gastro-esophageal reflux disease without esophagitis: Secondary | ICD-10-CM | POA: Insufficient documentation

## 2018-05-18 DIAGNOSIS — I480 Paroxysmal atrial fibrillation: Secondary | ICD-10-CM

## 2018-05-18 DIAGNOSIS — Z7984 Long term (current) use of oral hypoglycemic drugs: Secondary | ICD-10-CM | POA: Diagnosis not present

## 2018-05-18 DIAGNOSIS — J449 Chronic obstructive pulmonary disease, unspecified: Secondary | ICD-10-CM | POA: Insufficient documentation

## 2018-05-18 DIAGNOSIS — I10 Essential (primary) hypertension: Secondary | ICD-10-CM | POA: Diagnosis not present

## 2018-05-18 DIAGNOSIS — Z87891 Personal history of nicotine dependence: Secondary | ICD-10-CM | POA: Diagnosis not present

## 2018-05-18 DIAGNOSIS — E7800 Pure hypercholesterolemia, unspecified: Secondary | ICD-10-CM | POA: Insufficient documentation

## 2018-05-18 DIAGNOSIS — Z6841 Body Mass Index (BMI) 40.0 and over, adult: Secondary | ICD-10-CM | POA: Diagnosis not present

## 2018-05-18 DIAGNOSIS — I471 Supraventricular tachycardia: Secondary | ICD-10-CM | POA: Diagnosis not present

## 2018-05-18 DIAGNOSIS — I481 Persistent atrial fibrillation: Secondary | ICD-10-CM | POA: Insufficient documentation

## 2018-05-18 DIAGNOSIS — E119 Type 2 diabetes mellitus without complications: Secondary | ICD-10-CM | POA: Insufficient documentation

## 2018-05-18 DIAGNOSIS — Z7901 Long term (current) use of anticoagulants: Secondary | ICD-10-CM | POA: Diagnosis not present

## 2018-05-18 DIAGNOSIS — I4891 Unspecified atrial fibrillation: Secondary | ICD-10-CM

## 2018-05-18 HISTORY — PX: CARDIOVERSION: SHX1299

## 2018-05-18 LAB — GLUCOSE, CAPILLARY: Glucose-Capillary: 112 mg/dL — ABNORMAL HIGH (ref 70–99)

## 2018-05-18 SURGERY — CARDIOVERSION
Anesthesia: General

## 2018-05-18 MED ORDER — SODIUM CHLORIDE 0.9 % IV SOLN
INTRAVENOUS | Status: DC | PRN
  Administered 2018-05-18: 13:00:00 via INTRAVENOUS

## 2018-05-18 MED ORDER — PROPOFOL 10 MG/ML IV BOLUS
INTRAVENOUS | Status: DC | PRN
  Administered 2018-05-18: 150 mg via INTRAVENOUS

## 2018-05-18 MED ORDER — LIDOCAINE 2% (20 MG/ML) 5 ML SYRINGE
INTRAMUSCULAR | Status: DC | PRN
  Administered 2018-05-18: 40 mg via INTRAVENOUS

## 2018-05-18 NOTE — Progress Notes (Addendum)
Pt in for EKG pre-dccv.  To be reviewed by Roderic Palau, NP  Pt is here for EKG, he had successful cardioversion, felt much improved,  but got the shingle shot right after DCCV and had fever, N/V and went back into afib. He wanted to try cardioversion one more time, as he is planning to go to [facility] end of the week, and wants to feel well. It  was explained to pt, that if  he has ERAF after this cardioversion, he will require AAD's. Labs are still up to date from 9/11. No missed anticoagulation.

## 2018-05-18 NOTE — Transfer of Care (Signed)
Immediate Anesthesia Transfer of Care Note  Patient: Tommy Yang  Procedure(s) Performed: CARDIOVERSION (N/A )  Patient Location: Endoscopy Unit  Anesthesia Type:General  Level of Consciousness: awake and oriented  Airway & Oxygen Therapy: Patient Spontanous Breathing and Patient connected to face mask oxygen  Post-op Assessment: Report given to RN and Post -op Vital signs reviewed and stable  Post vital signs: Reviewed and stable  Last Vitals:  Vitals Value Taken Time  BP    Temp    Pulse    Resp    SpO2      Last Pain:  Vitals:   05/18/18 1224  TempSrc: Oral  PainSc: 0-No pain         Complications: No apparent anesthesia complications

## 2018-05-18 NOTE — Telephone Encounter (Signed)
Please see note below and advise  

## 2018-05-18 NOTE — Telephone Encounter (Signed)
Can sometimes have mild symptoms after immunizations, but if still having stomach symptoms would recommend evaluation, can be seen by work in provider if needed.

## 2018-05-18 NOTE — Discharge Instructions (Signed)
Electrical Cardioversion, Care After °This sheet gives you information about how to care for yourself after your procedure. Your health care provider may also give you more specific instructions. If you have problems or questions, contact your health care provider. °What can I expect after the procedure? °After the procedure, it is common to have: °· Some redness on the skin where the shocks were given. ° °Follow these instructions at home: °· Do not drive for 24 hours if you were given a medicine to help you relax (sedative). °· Take over-the-counter and prescription medicines only as told by your health care provider. °· Ask your health care provider how to check your pulse. Check it often. °· Rest for 48 hours after the procedure or as told by your health care provider. °· Avoid or limit your caffeine use as told by your health care provider. °Contact a health care provider if: °· You feel like your heart is beating too quickly or your pulse is not regular. °· You have a serious muscle cramp that does not go away. °Get help right away if: °· You have discomfort in your chest. °· You are dizzy or you feel faint. °· You have trouble breathing or you are short of breath. °· Your speech is slurred. °· You have trouble moving an arm or leg on one side of your body. °· Your fingers or toes turn cold or blue. °This information is not intended to replace advice given to you by your health care provider. Make sure you discuss any questions you have with your health care provider. °Document Released: 06/08/2013 Document Revised: 03/21/2016 Document Reviewed: 02/22/2016 °Elsevier Interactive Patient Education © 2018 [employer] ° °

## 2018-05-18 NOTE — Anesthesia Postprocedure Evaluation (Signed)
Anesthesia Post Note  Patient: Tommy WATERSON  Procedure(s) Performed: CARDIOVERSION (N/A )     Patient location during evaluation: Endoscopy Anesthesia Type: General Level of consciousness: awake and alert, oriented and patient cooperative Pain management: pain level controlled Vital Signs Assessment: post-procedure vital signs reviewed and stable Respiratory status: spontaneous breathing, nonlabored ventilation and respiratory function stable Cardiovascular status: blood pressure returned to baseline and stable Postop Assessment: no apparent nausea or vomiting Anesthetic complications: no    Last Vitals:  Vitals:   05/18/18 1352 05/18/18 1400  BP: (!) 116/51 (!) 127/58  Pulse: 64 99  Resp: (!) 24 18  Temp: 36.9 C   SpO2: 95% 95%    Last Pain:  Vitals:   05/18/18 1400  TempSrc:   PainSc: 0-No pain                 Kahlen Morais,E. Tomekia Helton

## 2018-05-18 NOTE — Telephone Encounter (Signed)
Spoke with patient and he expressed his angry with it being 4 days late and hung up on me.

## 2018-05-18 NOTE — Addendum Note (Signed)
Addendum  created 05/18/18 1444 by Orlie Dakin, CRNA   Intraprocedure Flowsheets edited

## 2018-05-18 NOTE — Anesthesia Preprocedure Evaluation (Addendum)
Anesthesia Evaluation  Patient identified by MRN, date of birth, ID band Patient awake    Reviewed: Allergy & Precautions, NPO status , Patient's Chart, lab work & pertinent test results, reviewed documented beta blocker date and time   History of Anesthesia Complications Negative for: history of anesthetic complications  Airway Mallampati: II  TM Distance: >3 FB Neck ROM: Full    Dental  (+) Dental Advisory Given, Poor Dentition, Missing, Chipped   Pulmonary COPD, former smoker,    breath sounds clear to auscultation       Cardiovascular hypertension, Pt. on medications and Pt. on home beta blockers (-) angina+ DVT   Rhythm:Irregular Rate:Normal  8/19 ECHO: EF 60-65%, valves OK   Neuro/Psych negative neurological ROS     GI/Hepatic Neg liver ROS, GERD  Medicated and Controlled,  Endo/Other  diabetes (glu 112), Oral Hypoglycemic AgentsMorbid obesity  Renal/GU Renal InsufficiencyRenal disease (creat 1.30)   Prostate cancer    Musculoskeletal   Abdominal (+) + obese,   Peds  Hematology xarelto   Anesthesia Other Findings   Reproductive/Obstetrics                            Anesthesia Physical Anesthesia Plan  ASA: III  Anesthesia Plan: General   Post-op Pain Management:    Induction: Intravenous  PONV Risk Score and Plan: 2 and Treatment may vary due to age or medical condition  Airway Management Planned: Natural Airway and Mask  Additional Equipment:   Intra-op Plan:   Post-operative Plan:   Informed Consent: I have reviewed the patients History and Physical, chart, labs and discussed the procedure including the risks, benefits and alternatives for the proposed anesthesia with the patient or authorized representative who has indicated his/her understanding and acceptance.   Dental advisory given  Plan Discussed with: CRNA and Surgeon  Anesthesia Plan Comments: (Plan  routine monitors, MAC)        Anesthesia Quick Evaluation

## 2018-05-18 NOTE — CV Procedure (Signed)
Procedure:   DCCV  Indication:  Symptomatic atrial fibrillation  Procedure Note:  The patient signed informed consent.  He has had had therapeutic anticoagulation with rivaroxaban greater than 3 weeks.  Anesthesia was administered by Dr. Glennon Mac.  Adequate airway was maintained throughout and vital followed per protocol.  He was cardioverted x 3 with 200J of biphasic synchronized energy. Pressure was applied to the chest during shocks. He did not convert.  There were no apparent complications.  The patient had normal neuro status and respiratory status post procedure with vitals stable as recorded elsewhere.    Follow up:  He will follow up with atrial fibrillation clinic. Recommend increasing metoprolol to 100 mg BID given rapid heart rate.  Buford Dresser, MD PhD 05/18/2018 1:50 PM

## 2018-05-18 NOTE — Interval H&P Note (Signed)
History and Physical Interval Note:  05/18/2018 1:31 PM  Tommy Yang  has presented today for surgery, with the diagnosis of a fib  The various methods of treatment have been discussed with the patient and family. After consideration of risks, benefits and other options for treatment, the patient has consented to  Procedure(s): CARDIOVERSION (N/A) as a surgical intervention .  The patient's history has been reviewed, patient examined, no change in status, stable for surgery.  I have reviewed the patient's chart and labs.  Questions were answered to the patient's satisfaction.     Juda Toepfer Harrell Gave

## 2018-05-20 ENCOUNTER — Telehealth (HOSPITAL_COMMUNITY): Payer: Self-pay | Admitting: Surgery

## 2018-05-20 DIAGNOSIS — I10 Essential (primary) hypertension: Secondary | ICD-10-CM

## 2018-05-20 MED ORDER — METOPROLOL TARTRATE 50 MG PO TABS: 100.0000 mg | ORAL_TABLET | Freq: Two times a day (BID) | ORAL | 1 refills | Status: DC

## 2018-05-20 NOTE — Telephone Encounter (Signed)
Tommy Yang called to let the AFib clinic to let provider know that he was instructed after cardioversion to increase his Metoprolol to 100 mg BID.  I have updated his medications in [facility].

## 2018-05-27 NOTE — Telephone Encounter (Signed)
Sign encounter

## 2018-06-02 ENCOUNTER — Encounter (HOSPITAL_COMMUNITY): Payer: Self-pay | Admitting: Nurse Practitioner

## 2018-06-02 ENCOUNTER — Ambulatory Visit (HOSPITAL_COMMUNITY)
Admission: RE | Admit: 2018-06-02 | Discharge: 2018-06-02 | Disposition: A | Discharge status: Home / Self Care | Payer: BLUE CROSS/BLUE SHIELD | Source: Ambulatory Visit | Attending: Nurse Practitioner | Admitting: Nurse Practitioner

## 2018-06-02 VITALS — BP 130/72 | HR 93 | Ht 71.0 in | Wt 310.0 lb

## 2018-06-02 DIAGNOSIS — Z7901 Long term (current) use of anticoagulants: Secondary | ICD-10-CM | POA: Diagnosis not present

## 2018-06-02 DIAGNOSIS — G4733 Obstructive sleep apnea (adult) (pediatric): Secondary | ICD-10-CM | POA: Insufficient documentation

## 2018-06-02 DIAGNOSIS — Z888 Allergy status to other drugs, medicaments and biological substances status: Secondary | ICD-10-CM | POA: Diagnosis not present

## 2018-06-02 DIAGNOSIS — I10 Essential (primary) hypertension: Secondary | ICD-10-CM | POA: Insufficient documentation

## 2018-06-02 DIAGNOSIS — C61 Malignant neoplasm of prostate: Secondary | ICD-10-CM | POA: Diagnosis not present

## 2018-06-02 DIAGNOSIS — Z6841 Body Mass Index (BMI) 40.0 and over, adult: Secondary | ICD-10-CM | POA: Insufficient documentation

## 2018-06-02 DIAGNOSIS — K219 Gastro-esophageal reflux disease without esophagitis: Secondary | ICD-10-CM | POA: Insufficient documentation

## 2018-06-02 DIAGNOSIS — Z86718 Personal history of other venous thrombosis and embolism: Secondary | ICD-10-CM | POA: Diagnosis not present

## 2018-06-02 DIAGNOSIS — E119 Type 2 diabetes mellitus without complications: Secondary | ICD-10-CM | POA: Diagnosis not present

## 2018-06-02 DIAGNOSIS — Z79899 Other long term (current) drug therapy: Secondary | ICD-10-CM | POA: Insufficient documentation

## 2018-06-02 DIAGNOSIS — I4811 Longstanding persistent atrial fibrillation: Secondary | ICD-10-CM | POA: Diagnosis not present

## 2018-06-02 DIAGNOSIS — I4891 Unspecified atrial fibrillation: Secondary | ICD-10-CM | POA: Diagnosis present

## 2018-06-02 DIAGNOSIS — Z7984 Long term (current) use of oral hypoglycemic drugs: Secondary | ICD-10-CM | POA: Insufficient documentation

## 2018-06-02 DIAGNOSIS — Z833 Family history of diabetes mellitus: Secondary | ICD-10-CM | POA: Insufficient documentation

## 2018-06-02 DIAGNOSIS — Z79890 Hormone replacement therapy (postmenopausal): Secondary | ICD-10-CM | POA: Insufficient documentation

## 2018-06-02 DIAGNOSIS — Z8249 Family history of ischemic heart disease and other diseases of the circulatory system: Secondary | ICD-10-CM | POA: Diagnosis not present

## 2018-06-02 DIAGNOSIS — Z87891 Personal history of nicotine dependence: Secondary | ICD-10-CM | POA: Diagnosis not present

## 2018-06-02 DIAGNOSIS — E7800 Pure hypercholesterolemia, unspecified: Secondary | ICD-10-CM | POA: Insufficient documentation

## 2018-06-02 DIAGNOSIS — I4819 Other persistent atrial fibrillation: Secondary | ICD-10-CM | POA: Insufficient documentation

## 2018-06-02 NOTE — Progress Notes (Signed)
Primary Care Physician: Wendie Agreste, MD Referring Physician: Dr. Oval Linsey Cardiologist: Dr. Lorenda Peck is a 67 y.o. male with a h/o morbid obesity, DM. S/p prostate CA, SVT,HTN, that is in the afib clinic for afib that was found at PCP office 8/19. He was c/o of shortness of breath and LLE since taking Lupron shot. He was found to be in afib with RVR. He has been on xarelto  for h/o DVT for some time. He was last seen by Dr. Claiborne Billings in 2017 after a bout of afib. Pt converted spontaneously. On PCP visit, BB was increased, echo ordered. He is now in rate controlled afib and he does not notice any longer He was surprised to find he is still in afib.[name] He is not sure if he is persistent or paroxysmal. He feels at baseline today. He states that he  had a positive sleep study in past but  I can not find the order, and he couldn't  tolerate mask. No excessive caffeine or alcohol, no tobacco. Is sedentary.  F/u 9/5, a 2 week monitor showed that pt is persistently in afib. He states that he is still fairly asymptomatic but slightly winded with exertion on first starting walking. DCCV discussed with pt and he would like to purse as he has a trip to [place] planned toward the end of the month.  No missed doses of xarelto.  F/u in afib clinic 10/2. He had a successful cardioversion, "felt great" !! This lasted for 24 hours. He received the flu shot with fever, N/V and went back into afib. He was going to [facility] and wanted another cardioversion, but this was not successful. He is now back in the clinic to discuss options to restore SR.[name]  Today, he denies symptoms of palpitations, chest pain, shortness of breath, orthopnea, PND, lower extremity edema, dizziness, presyncope, syncope, or neurologic sequela. The patient is tolerating medications without difficulties and is otherwise without complaint today.   Past Medical History:  Diagnosis Date  . Diabetes mellitus without complication ([place])   .  DVT (deep venous thrombosis) (H[facility])    left leg.  [name] GERD (gastroesophageal reflux disease)   . Hypercholesterolemia   . Hypertension   . Prostate cancer ([place] [place]) 12/06/13   gleason 4+3=7, 6/12 cores positive. seed implant and radiation   Past Surgical History:  Procedure Laterality Date  . APPENDECTOMY    . BALLOON DILATION N/A 08/02/2015   Procedure: BALLOON DILATION;  Surgeon: [name], MD;  Location: [facility];  Service: Endoscopy;  Laterality: N/A;  . CARDIOVERSION N/A 05/13/2018   Procedure: CARDIOVERSION;  Surgeon: Sanda Klein, MD;  Location: [facility];  Service: Cardiovascular;  Laterality: N/A;  . CARDIOVERSION N/A 05/18/2018   Procedure: CARDIOVERSION;  Surgeon: Buford Dresser, MD;  Location: [facility];  Service: Cardiovascular;  Laterality: N/A;  . COLONOSCOPY WITH PROPOFOL N/A 08/02/2015   Procedure: COLONOSCOPY WITH PROPOFOL w/ APC;  Surgeon: [name], MD;  Location: [facility];  Service: Endoscopy;  Laterality: N/A;  . ESOPHAGOGASTRODUODENOSCOPY (EGD) WITH PROPOFOL N/A 08/02/2015   Procedure: ESOPHAGOGASTRODUODENOSCOPY (EGD) WITH PROPOFOL/ possible dilation.;  Surgeon: [name], MD;  Location: [facility];  Service: Endoscopy;  Laterality: N/A;  . KNEE SURGERY     right knee orthoscopic  . PROSTATE BIOPSY  12/06/13   Gleason 4+3=7, volume 35 gm  . [place]  . TONSILLECTOMY      Current Outpatient Medications  Medication Sig Dispense Refill  . amLODipine (NORVASC) 2.5 MG tablet Take 1 tablet (2.5 mg total) by mouth daily. 90 tablet 1  . atorvastatin (LIPITOR) 40 MG tablet TAKE ONE TABLET BY MOUTH ONCE DAILY. 90 tablet 1  . blood glucose meter kit and supplies KIT Test blood sugar daily as directed. Dx code: E11.9 1 each 0  . CINNAMON PO Take 1,000 mg by mouth daily.    [name] Coral Calcium 1000 (390 Ca) MG TABS Take 1,000 mg by mouth daily.    . fluticasone (FLONASE) 50 MCG/ACT nasal spray Place 2  sprays into both nostrils daily as needed for allergies.  99  . furosemide (LASIX) 20 MG tablet Take 1 tablet (20 mg total) by mouth daily. (Patient taking differently: Take 20 mg by mouth every other day. ) 30 tablet 0  . glipiZIDE (GLUCOTROL XL) 2.5 MG 24 hr tablet TAKE 1 TABLET BY MOUTH ONCE DAILY WITH BREAKFAST (Patient taking differently: Take 2.5 mg by mouth daily with breakfast. ) 90 tablet 2  . Icosapent Ethyl (VASCEPA) 1 g CAPS Take 2 capsules (2 g total) by mouth 2 (two) times daily. 120 capsule 2  . INVOKAMET XR [phone] MG TB24 TAKE 2 TABLETS DAILY WITH BREAKFAST (Patient taking differently: Take 2 tablets by mouth every morning. ) 180 tablet 3  . leuprolide (LUPRON) 11.25 MG injection Inject 11.25 mg into the muscle every 3 (three) months.    [name] losartan (COZAAR) 100 MG tablet Take 0.5 tablets (50 mg total) by mouth daily. 90 tablet 1  . metoprolol tartrate (LOPRESSOR) 50 MG tablet Take 2 tablets (100 mg total) by mouth 2 (two) times daily. 180 tablet 1  . omeprazole (PRILOSEC) 40 MG capsule One tablet daily as needed 90 capsule 3  . ONE TOUCH ULTRA TEST test strip CHECK TWICE A DAY 200 each 1  . rivaroxaban (XARELTO) 20 MG TABS tablet Take 1 tablet (20 mg total) by mouth daily with supper. 90 tablet 1  . Semaglutide (OZEMPIC) 0.25 or 0.5 MG/DOSE SOPN Inject 0.5 mg into the skin once a week. Inject 0.82m once weekly for the first two weeks, and then increase to 0.551mweekly. (Patient taking differently: Inject 0.5 mg into the skin every Friday. ) 3 pen 1  . Vitamin D, Cholecalciferol, 400 UNITS TABS Take 400 Units by mouth daily.     No current facility-administered medications for this encounter.     Allergies  Allergen Reactions  . Lupron [Leuprolide] Swelling and Other (See Comments)    Put in A-fib    Social History   Socioeconomic History  . Marital status: Married    Spouse name: Not on file  . Number of children: 1  . Years of education: Not on file  . Highest  education level: Not on file  Occupational History    Employer: RETIRED  Social Needs  . Financial resource strain: Not on file  . Food insecurity:    Worry: Not on file    Inability: Not on file  . Transportation needs:    Medical: Not on file    Non-medical: Not on file  Tobacco Use  . Smoking status: Former Smoker    Packs/day: 0.50    Years: 23.00    Pack years: 11.50    Types: Cigarettes    Last attempt to quit: 08/01/2012    Years since quitting: 5.8  . Smokeless tobacco: Never Used  Substance and Sexual Activity  . Alcohol use: Yes  Alcohol/week: 0.0 standard drinks    Comment: very seldom, maybe 3x per year  . Drug use: No  . Sexual activity: Yes    Birth control/protection: Abstinence  Lifestyle  . Physical activity:    Days per week: Not on file    Minutes per session: Not on file  . Stress: Not on file  Relationships  . Social connections:    Talks on phone: Not on file    Gets together: Not on file    Attends religious service: Not on file    Active member of club or organization: Not on file    Attends meetings of clubs or organizations: Not on file    Relationship status: Not on file  . Intimate partner violence:    Fear of current or ex partner: Not on file    Emotionally abused: Not on file    Physically abused: Not on file    Forced sexual activity: Not on file  Other Topics Concern  . 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 Heart disease Mother        before age 43  . Diabetes Mother   . Hyperlipidemia Mother   . Varicose Veins Mother   . Heart disease Father   . Diabetes Father   . Cancer Neg Hx     ROS- All systems are reviewed and negative  except as per the HPI above  Physical Exam: Vitals:   06/02/18 0843  BP: 130/72  Pulse: 93  Weight: (!) 140.6 kg  Height: '5\' 11"'  (1.803 m)   Wt Readings from Last 3 Encounters:  06/02/18 (!) 140.6 kg  05/13/18 (!) 142 kg  05/12/18 (!) 142 kg    Labs: Lab Results  Component Value Date   NA 142 05/12/2018   K 5.0 05/12/2018   CL 103 05/12/2018   CO2 22 05/12/2018   GLUCOSE 105 (H) 05/12/2018   BUN 23 05/12/2018   CREATININE 1.30 (H) 05/12/2018   CALCIUM 9.4 05/12/2018   MG 2.1 05/12/2017   Lab Results  Component Value Date   INR 1.55 (H) 10/19/2013   Lab Results  Component Value Date   CHOL 169 04/28/2018   HDL 32.20 (L) 04/28/2018   LDLCALC 104 (H) 04/28/2018   TRIG 166.0 (H) 04/28/2018     GEN- The patient is well appearing, alert and oriented x 3 today.   Head- normocephalic, atraumatic Eyes-  Sclera clear, conjunctiva pink Ears- hearing intact Oropharynx- clear Neck- supple, no JVP Lymph- no cervical lymphadenopathy Lungs- Clear to ausculation bilaterally, normal work of breathing Heart- irregular rate and rhythm, no murmurs, rubs or gallops, PMI not laterally displaced GI- soft, NT, ND, + BS Extremities- no clubbing, cyanosis, or edema MS- no significant deformity or atrophy Skin- no rash or lesion Psych- euthymic mood, full affect Neuro- strength and sensation are intact  EKG-afib at 93 bpm, qrs int 80 ms, qtc 455 ms Echo-  04/28/18 -Study Conclusions  - Left ventricle: The cavity size was normal. Wall thickness was   increased in a pattern of moderate LVH. Systolic function was   normal. The estimated ejection fraction  was in the range of 60%   to 65%. Wall motion was normal; there were no regional wall   motion abnormalities. The study is not technically sufficient to   allow evaluation of LV diastolic function. - Left atrium: The atrium was mildly dilated. Volume/bsa, ES   (1-plane Simpson&'s, A4C): 36.2 ml/m^2. Left atrium size 56 mm Echo-  2016-Study Conclusions  - Left ventricle: The cavity size was normal. Wall thickness was   increased in a pattern of mild LVH. There was mild focal basal   hypertrophy of the septum. Systolic function was normal. The   estimated ejection fraction was in the range of 55% to 60%. Wall   motion was normal; there were no regional wall motion   abnormalities. Doppler parameters are consistent with abnormal   left ventricular relaxation (grade 1 diastolic dysfunction).  Impressions:  - Normal LV function; grade 1 diastolic dysfunction.   Assessment and Plan: 1.Persisitent  afib  Pt had one successful cardioversion and felt great, unfortunately went back in  24 hours later after getting the flu shot, associated with fever, N/V  DCCV performed again and was unsuccessful Because he felt so well in SR he would like to try restore SR I think Tikosyn is the best best, but I worry untreated sleep apnea, obesity and left atrial size being  55 mm, may undermine long term ability to restore SR He is on xarelto 20 mg for a CHA2DS2VASc score of at least 5, states no missed doses  2. BMI of 44.13 Regular exercise and weight loss recommended, he has lost 2 pounds  3. HTN Stable  4. OSA States cannot tolerate mask  5. Prostate CA He is to see Urologist about this in 2 weeks, he antici[pates change in treatment  We will have pt check on price of drug, see what urologist says and then regroup to see about Tikosyn admit   Butch Penny C. Jaramiah Bossard, [facility] [address] [place], [place] [address] [phone]

## 2018-06-08 ENCOUNTER — Other Ambulatory Visit (HOSPITAL_COMMUNITY): Payer: Self-pay | Admitting: *Deleted

## 2018-06-08 ENCOUNTER — Telehealth: Payer: Self-pay | Admitting: Family Medicine

## 2018-06-08 DIAGNOSIS — I10 Essential (primary) hypertension: Secondary | ICD-10-CM

## 2018-06-08 MED ORDER — METOPROLOL TARTRATE 100 MG PO TABS: 100.0000 mg | ORAL_TABLET | Freq: Two times a day (BID) | ORAL | 6 refills | Status: DC

## 2018-06-08 NOTE — Telephone Encounter (Signed)
Copied from [place] [id number]. Topic: General - Other >> Jun 03, 2018 10:53 AM Leward Quan A wrote: Reason for CRM: Patient called to request a new Rx sent to pharmacy for metoprolol tartrate (LOPRESSOR) 50 MG tablet said Cardiologist doubled his dose to 4 tabs daily. Michela Pitcher he has about 4 days left and need new Rx ASAP. >> Jun 08, 2018 11:34 AM Ahmed Prima L wrote: Patient called back and said he is now out of this medication. He called this request in on 10/3 and is not getting a reply. Can someone call him today @ [phone]

## 2018-06-08 NOTE — Telephone Encounter (Signed)
I have called the pt back and he stated that he is not satisfied with the care and communication coming form our office. He feels that he was told when he is in Afib to call his primary care and he stated that he called twice and then he received a call 4 days later to see how he was doing. He stated that he wants to have a refill of the Metoorolol since he was told by the cardiology to increase the dose so he no longer has any medication today. I have informed him that typically the cardiologist refills the medication if they are saying it needs to be adjusted. Pt was very upset with that response and states that he is going to report this.   Please advise do we refill this for pt?

## 2018-06-08 NOTE — Telephone Encounter (Signed)
Copied from [place] [id number]. Topic: General - Other >> Jun 03, 2018 10:53 AM Leward Quan A wrote: Reason for CRM: Patient called to request a new Rx sent to pharmacy for metoprolol tartrate (LOPRESSOR) 50 MG tablet said Cardiologist doubled his dose to 4 tabs daily. Michela Pitcher he has about 4 days left and need new Rx ASAP. >> Jun 08, 2018 11:34 AM Ahmed Prima L wrote: Patient called back and said he is now out of this medication. He called this request in on 10/3 and is not getting a reply. Can someone call him today @ [phone]

## 2018-06-10 NOTE — Telephone Encounter (Signed)
This encounter was created in error - please disregard.

## 2018-06-11 DIAGNOSIS — C61 Malignant neoplasm of prostate: Secondary | ICD-10-CM | POA: Diagnosis not present

## 2018-06-13 ENCOUNTER — Emergency Department (HOSPITAL_COMMUNITY)
Admission: EM | Admit: 2018-06-13 | Discharge: 2018-06-13 | Disposition: A | Discharge status: Home / Self Care | Payer: BLUE CROSS/BLUE SHIELD | Attending: Emergency Medicine | Admitting: Emergency Medicine

## 2018-06-13 ENCOUNTER — Encounter (HOSPITAL_COMMUNITY): Payer: Self-pay | Admitting: Emergency Medicine

## 2018-06-13 ENCOUNTER — Other Ambulatory Visit: Payer: Self-pay

## 2018-06-13 DIAGNOSIS — T6591XA Toxic effect of unspecified substance, accidental (unintentional), initial encounter: Secondary | ICD-10-CM | POA: Insufficient documentation

## 2018-06-13 DIAGNOSIS — Z79899 Other long term (current) drug therapy: Secondary | ICD-10-CM | POA: Diagnosis not present

## 2018-06-13 DIAGNOSIS — Z7984 Long term (current) use of oral hypoglycemic drugs: Secondary | ICD-10-CM | POA: Diagnosis not present

## 2018-06-13 DIAGNOSIS — Z8546 Personal history of malignant neoplasm of prostate: Secondary | ICD-10-CM | POA: Insufficient documentation

## 2018-06-13 DIAGNOSIS — E119 Type 2 diabetes mellitus without complications: Secondary | ICD-10-CM | POA: Insufficient documentation

## 2018-06-13 DIAGNOSIS — Z87891 Personal history of nicotine dependence: Secondary | ICD-10-CM | POA: Diagnosis not present

## 2018-06-13 DIAGNOSIS — T447X1A Poisoning by beta-adrenoreceptor antagonists, accidental (unintentional), initial encounter: Secondary | ICD-10-CM | POA: Diagnosis not present

## 2018-06-13 DIAGNOSIS — Z7901 Long term (current) use of anticoagulants: Secondary | ICD-10-CM | POA: Diagnosis not present

## 2018-06-13 DIAGNOSIS — R000 Tachycardia, unspecified: Secondary | ICD-10-CM | POA: Diagnosis not present

## 2018-06-13 DIAGNOSIS — I10 Essential (primary) hypertension: Secondary | ICD-10-CM | POA: Insufficient documentation

## 2018-06-13 NOTE — ED Notes (Addendum)
Pt feeling much better. Ambulated to Bathroom without feeling dizzy or light headed.

## 2018-06-13 NOTE — ED Triage Notes (Signed)
Patient is complaining of taking to much lopressor. He took a dose of 100 mg of lopressor early this am and then when he got a few min ago he took another 100 mg. Patient is worried he took to much.

## 2018-06-13 NOTE — ED Provider Notes (Signed)
[facility] DEPT Provider Note   CSN: [id number] Arrival date & time: 06/13/18  0347     History   Chief Complaint Chief Complaint  Patient presents with  . Ingestion    HPI Tommy Yang is a 67 y.o. male.  HPI Patient presents after unintentional ingestion of excessive medication. Patient has multiple medical issues including atrial fibrillation. He is beta-blocker for control, typically takes 100 mg of Lopressor in the morning. Today, patient took 2 tablets. He denies new complaints, states that he feels generally well, denies notable recent changes in his health, does acknowledge ongoing difficulty with A. fib rhythm control, but it seems as though with his current medication regimen his rate is reasonably controlled and he is able to perform ADL without limitation.  Past Medical History:  Diagnosis Date  . Diabetes mellitus without complication ([place])   . DVT (deep venous thrombosis) ([facility])    left leg.  [name] GERD (gastroesophageal reflux disease)   . Hypercholesterolemia   . Hypertension   . Prostate cancer ([place]) 12/06/13   gleason 4+3=7, 6/12 cores positive. seed implant and radiation    Patient Active Problem List   Diagnosis Date Noted  . Persistent atrial fibrillation   . Microalbuminuria 04/23/2016  . Paroxysmal atrial fibrillation ([place]) 12/29/2015  . Rectal bleeding 07/18/2015  . Dysphagia 07/18/2015  . Chronic anticoagulation 03/14/2015  . Morbid obesity ([place]) 03/14/2015  . Exertional shortness of breath 01/12/2015  . Abnormal ECG 01/12/2015  . Encounter for screening colonoscopy 05/09/2014  . Esophageal reflux 05/09/2014  . Other dysphagia 05/09/2014  . Evaluate for OSA (obstructive sleep apnea) 02/28/2014  . Hyperlipidemia 02/28/2014  . Prostate cancer ([place]) 12/14/2013  . PSA elevation 10/26/2013  . Erectile dysfunction 10/26/2013  . Type II diabetes mellitus, uncontrolled ([place]) 10/19/2013  . DVT (deep venous  thrombosis) ([place])   . Left leg DVT ([place]) 09/28/2013  . Leg edema, left 09/28/2013    Past Surgical History:  Procedure Laterality Date  . APPENDECTOMY    . BALLOON DILATION N/A 08/02/2015   Procedure: BALLOON DILATION;  Surgeon: [name], MD;  Location: [facility];  Service: Endoscopy;  Laterality: N/A;  . CARDIOVERSION N/A 05/13/2018   Procedure: CARDIOVERSION;  Surgeon: Sanda Klein, MD;  Location: [facility];  Service: Cardiovascular;  Laterality: N/A;  . CARDIOVERSION N/A 05/18/2018   Procedure: CARDIOVERSION;  Surgeon: Buford Dresser, MD;  Location: [facility];  Service: Cardiovascular;  Laterality: N/A;  . COLONOSCOPY WITH PROPOFOL N/A 08/02/2015   Procedure: COLONOSCOPY WITH PROPOFOL w/ APC;  Surgeon: [name], MD;  Location: [facility];  Service: Endoscopy;  Laterality: N/A;  . ESOPHAGOGASTRODUODENOSCOPY (EGD) WITH PROPOFOL N/A 08/02/2015   Procedure: ESOPHAGOGASTRODUODENOSCOPY (EGD) WITH PROPOFOL/ possible dilation.;  Surgeon: [name], MD;  Location[facility];  Service: Endoscopy;  Laterality: N/A;  . KNEE SURGERY     right knee orthoscopic  . PROSTATE BIOPSY  12/06/13   Gleason 4+3=7, volume 35 gm  . [place]  . TONSILLECTOMY          Home Medications    Prior to Admission medications   Medication Sig Start Date End Date Taking? Authorizing Provider  amLODipine (NORVASC) 2.5 MG tablet Take 1 tablet (2.5 mg total) by mouth daily. 05/12/18   Wendie Agreste, MD  atorvastatin (LIPITOR) 40 MG tablet TAKE ONE TABLET BY MOUTH ONCE DAILY. 05/12/18   Wendie Agreste, MD  blood glucose meter  kit and supplies KIT Test blood sugar daily as directed. Dx code: E11.9 01/23/15   Darlyne Russian, MD  CINNAMON PO Take 1,000 mg by mouth daily.    [provider]  Coral Calcium 1000 (390 Ca) MG TABS Take 1,000 mg by mouth daily.    [provider]  fluticasone (FLONASE) 50 MCG/ACT nasal  spray Place 2 sprays into both nostrils daily as needed for allergies. 05/09/18   [provider]  furosemide (LASIX) 20 MG tablet Take 1 tablet (20 mg total) by mouth daily. Patient taking differently: Take 20 mg by mouth every other day.  04/20/18   Wendie Agreste, MD  glipiZIDE (GLUCOTROL XL) 2.5 MG 24 hr tablet TAKE 1 TABLET BY MOUTH ONCE DAILY WITH BREAKFAST Patient taking differently: Take 2.5 mg by mouth daily with breakfast.  05/11/18   Elayne Snare, MD  Icosapent Ethyl (VASCEPA) 1 g CAPS Take 2 capsules (2 g total) by mouth 2 (two) times daily. 02/02/18   Elayne Snare, MD  INVOKAMET XR [phone] MG TB24 TAKE 2 TABLETS DAILY WITH BREAKFAST Patient taking differently: Take 2 tablets by mouth every morning.  12/14/17   Elayne Snare, MD  leuprolide (LUPRON) 11.25 MG injection Inject 11.25 mg into the muscle every 3 (three) months.    [provider]  losartan (COZAAR) 100 MG tablet Take 0.5 tablets (50 mg total) by mouth daily. 05/12/18   Wendie Agreste, MD  metoprolol tartrate (LOPRESSOR) 100 MG tablet Take 1 tablet (100 mg total) by mouth 2 (two) times daily. 06/08/18   Sherran Needs, NP  omeprazole (PRILOSEC) 40 MG capsule One tablet daily as needed 05/12/18   Wendie Agreste, MD  ONE [facility] ULTRA TEST test strip CHECK TWICE A DAY 03/01/18   Elayne Snare, MD  rivaroxaban (XARELTO) 20 MG TABS tablet Take 1 tablet (20 mg total) by mouth daily with supper. 05/12/18   Wendie Agreste, MD  Semaglutide (OZEMPIC) 0.25 or 0.5 MG/DOSE SOPN Inject 0.5 mg into the skin once a week. Inject 0.1m once weekly for the first two weeks, and then increase to 0.548mweekly. Patient taking differently: Inject 0.5 mg into the skin every Friday.  12/29/17   KuElayne SnareMD  Vitamin D, Cholecalciferol, 400 UNITS TABS Take 400 Units by mouth daily.    [provider]    Family History Family History  Problem Relation Age of Onset  . Heart disease Mother        before age 65. Diabetes  Mother   . Hyperlipidemia Mother   . Varicose Veins Mother   . Heart disease Father   . Diabetes Father   . Cancer Neg Hx     Social History Social History   Tobacco Use  . Smoking status: Former Smoker    Packs/day: 0.50    Years: 23.00    Pack years: 11.50    Types: Cigarettes    Last attempt to quit: 08/01/2012    Years since quitting: 5.8  . Smokeless tobacco: Never Used  Substance Use Topics  . Alcohol use: Yes    Alcohol/week: 0.0 standard drinks    Comment: very seldom, maybe 3x per year  . Drug use: No     Allergies   Lupron [leuprolide]   Review of Systems Review of Systems  Constitutional:       Per HPI, otherwise negative  HENT:       Per HPI, otherwise negative  Respiratory:  Per HPI, otherwise negative  Cardiovascular:       Per HPI, otherwise negative  Gastrointestinal: Negative for vomiting.  Endocrine:       Negative aside from HPI  Genitourinary:       Neg aside from HPI   Musculoskeletal:       Per HPI, otherwise negative  Skin: Negative.   Neurological: Negative for syncope.     Physical Exam Updated Vital Signs BP 127/89 (BP Location: Left Arm)   Pulse 81   Temp 98 F (36.7 C) (Oral)   Resp 15   Ht '5\' 11"'  (1.803 m)   Wt 132.5 kg   SpO2 97%   BMI 40.73 kg/m   Physical Exam  Constitutional: He is oriented to person, place, and time. He appears well-developed. No distress.  Obese male awake and alert sitting upright speaking clearly  HENT:  Head: Normocephalic and atraumatic.  Eyes: Conjunctivae and EOM are normal.  Cardiovascular: An irregularly irregular rhythm present. Tachycardia present.  Pulmonary/Chest: Effort normal. No stridor. No respiratory distress.  Abdominal: He exhibits no distension.  Musculoskeletal: He exhibits no edema.  Neurological: He is alert and oriented to person, place, and time.  Skin: Skin is warm and dry.  Psychiatric: He has a normal mood and affect.  Nursing note and vitals  reviewed.    ED Treatments / Results   EKG EKG Interpretation  Date/Time:  Sunday June 13 2018 07:06:33 EDT Ventricular Rate:  99 PR Interval:    QRS Duration: 74 QT Interval:  333 QTC Calculation: 403 R Axis:   84 Text Interpretation:  Atrial fibrillation Ventricular premature complex Borderline right axis deviation Low voltage, precordial leads Abnormal ekg Confirmed by Carmin Muskrat [phone]) on 06/13/2018 7:30:00 AM   Cardiac monitor with atrial fibrillation, variable rate, 90s, 110, similar to EKG.  Procedures Procedures (including critical care time)    Initial Impression / Assessment and Plan / ED Course  I have reviewed the triage vital signs and the nursing notes.  Pertinent labs & imaging results that were available during my care of the patient were reviewed by me and considered in my medical decision making (see chart for details).     9:20 AM Patient awake, alert, ambulatory, states that he feels better than he did earlier today. No adverse consequences from his accidental ingestion of excessive medication, we discussed importance of following up with primary care, the patient was discharged in stable condition.   Final Clinical Impressions(s) / ED Diagnoses   Final diagnoses:  Accidental ingestion of substance, initial encounter     Carmin Muskrat, MD 06/13/18 [phone]

## 2018-06-13 NOTE — Discharge Instructions (Addendum)
As discussed, your evaluation today has been largely reassuring.  But, it is important that you monitor your condition carefully, and do not hesitate to return to the ED if you develop new, or concerning changes in your condition. ? ?Otherwise, please follow-up with your physician for appropriate ongoing care. ? ?

## 2018-06-13 NOTE — ED Notes (Signed)
Pt ambulating to bathroom without assistance. No complaints, continue to monitor.

## 2018-06-14 ENCOUNTER — Other Ambulatory Visit: Payer: Self-pay | Admitting: Endocrinology

## 2018-06-21 DIAGNOSIS — R3915 Urgency of urination: Secondary | ICD-10-CM | POA: Diagnosis not present

## 2018-07-19 ENCOUNTER — Telehealth: Payer: Self-pay | Admitting: Family Medicine

## 2018-07-19 NOTE — Telephone Encounter (Signed)
Copied from [place] [id number]. Topic: Quick Communication - Rx Refill/Question >> Jul 19, 2018 10:55 AM Sheran Luz wrote: Medication: losartan (COZAAR) 100 MG tablet   Patient would like to know if he can change dosage from 100mg  to 50mg , as he has been cutting the tablets in half as advised to do by PCP and he has trouble with keeping the half tablet intact. Patient is requesting the 50 mg be sent to pharmacy below as it is time for refill.   Preferred Pharmacy (with phone number or street name): [facility], [place] [phone] (Phone) [phone] (Fax)

## 2018-07-20 NOTE — Telephone Encounter (Signed)
Patient is requesting a change in Rx dosing- he is having trouble halving the tablet. Patient request Losartan 50 mg be sent to pharmacy.   Rx refill request: losartan 50 mg  LOV: 05/12/18  PCP: [place]: verified

## 2018-07-21 DIAGNOSIS — E119 Type 2 diabetes mellitus without complications: Secondary | ICD-10-CM | POA: Diagnosis not present

## 2018-07-21 LAB — HM DIABETES EYE EXAM

## 2018-07-22 MED ORDER — LOSARTAN POTASSIUM 50 MG PO TABS: 50.0000 mg | ORAL_TABLET | Freq: Every day | ORAL | 1 refills | Status: DC

## 2018-07-22 NOTE — Telephone Encounter (Signed)
Please see note below. 

## 2018-07-22 NOTE — Telephone Encounter (Signed)
Done. Thanks.

## 2018-07-27 ENCOUNTER — Encounter (HOSPITAL_COMMUNITY): Payer: Self-pay | Admitting: Nurse Practitioner

## 2018-07-27 ENCOUNTER — Ambulatory Visit (HOSPITAL_COMMUNITY)
Admission: RE | Admit: 2018-07-27 | Discharge: 2018-07-27 | Disposition: A | Discharge status: Home / Self Care | Payer: BLUE CROSS/BLUE SHIELD | Source: Ambulatory Visit | Attending: Nurse Practitioner | Admitting: Nurse Practitioner

## 2018-07-27 VITALS — BP 126/84 | HR 96 | Ht 71.0 in | Wt 312.0 lb

## 2018-07-27 DIAGNOSIS — Z6841 Body Mass Index (BMI) 40.0 and over, adult: Secondary | ICD-10-CM | POA: Insufficient documentation

## 2018-07-27 DIAGNOSIS — Z79890 Hormone replacement therapy (postmenopausal): Secondary | ICD-10-CM | POA: Insufficient documentation

## 2018-07-27 DIAGNOSIS — C61 Malignant neoplasm of prostate: Secondary | ICD-10-CM | POA: Insufficient documentation

## 2018-07-27 DIAGNOSIS — G4733 Obstructive sleep apnea (adult) (pediatric): Secondary | ICD-10-CM | POA: Insufficient documentation

## 2018-07-27 DIAGNOSIS — I4819 Other persistent atrial fibrillation: Secondary | ICD-10-CM

## 2018-07-27 DIAGNOSIS — Z8249 Family history of ischemic heart disease and other diseases of the circulatory system: Secondary | ICD-10-CM | POA: Diagnosis not present

## 2018-07-27 DIAGNOSIS — E119 Type 2 diabetes mellitus without complications: Secondary | ICD-10-CM | POA: Insufficient documentation

## 2018-07-27 DIAGNOSIS — Z86718 Personal history of other venous thrombosis and embolism: Secondary | ICD-10-CM | POA: Insufficient documentation

## 2018-07-27 DIAGNOSIS — I4891 Unspecified atrial fibrillation: Secondary | ICD-10-CM | POA: Diagnosis present

## 2018-07-27 DIAGNOSIS — E7800 Pure hypercholesterolemia, unspecified: Secondary | ICD-10-CM | POA: Diagnosis not present

## 2018-07-27 DIAGNOSIS — Z7984 Long term (current) use of oral hypoglycemic drugs: Secondary | ICD-10-CM | POA: Diagnosis not present

## 2018-07-27 DIAGNOSIS — Z79899 Other long term (current) drug therapy: Secondary | ICD-10-CM | POA: Diagnosis not present

## 2018-07-27 DIAGNOSIS — Z833 Family history of diabetes mellitus: Secondary | ICD-10-CM | POA: Diagnosis not present

## 2018-07-27 DIAGNOSIS — K219 Gastro-esophageal reflux disease without esophagitis: Secondary | ICD-10-CM | POA: Diagnosis not present

## 2018-07-27 DIAGNOSIS — Z7901 Long term (current) use of anticoagulants: Secondary | ICD-10-CM | POA: Diagnosis not present

## 2018-07-27 DIAGNOSIS — Z87891 Personal history of nicotine dependence: Secondary | ICD-10-CM | POA: Insufficient documentation

## 2018-07-27 DIAGNOSIS — I10 Essential (primary) hypertension: Secondary | ICD-10-CM | POA: Diagnosis not present

## 2018-07-27 NOTE — Progress Notes (Signed)
Primary Care Physician: Wendie Agreste, MD Referring Physician: Dr. Oval Linsey Cardiologist: Dr. Lorenda Peck is a 67 y.o. male with a h/o morbid obesity, DM. S/p prostate CA, SVT,HTN, that is in the afib clinic for afib that was found at PCP office 8/19. He was c/o of shortness of breath and LLE since taking Lupron shot. He was found to be in afib with RVR. He has been on xarelto  for h/o DVT for some time. He was last seen by Dr. Claiborne Billings in 2017 after a bout of afib. Pt converted spontaneously. On PCP visit, BB was increased, echo ordered. He is now in rate controlled afib and he does not notice any longer He was surprised to find he is still in afib.[name] He is not sure if he is persistent or paroxysmal. He feels at baseline today. He states that he  had a positive sleep study in past but  I can not find the order, and he couldn't  tolerate mask. No excessive caffeine or alcohol, no tobacco. Is sedentary.  F/u 9/5, a 2 week monitor showed that pt is persistently in afib. He states that he is still fairly asymptomatic but slightly winded with exertion on first starting walking. DCCV discussed with pt and he would like to purse as he has a trip to [place] planned toward the end of the month.  No missed doses of xarelto.  F/u in afib clinic 10/2. He had a successful cardioversion, "felt great" !! This lasted for 24 hours. He received the flu shot with fever, N/V and went back into afib. He was going to [facility] and wanted another cardioversion, but this was not successful. He is now back in the clinic to discuss options to restore SR. Tikosyn was discussed but pt was going  to a cancer center in [place] for a second opinion  for his prostrate cancer  and wanted to wait until after that visit to further discuss.  F/u in afib clinic 07/27/18. He had the second opinion at the [facility]. The  MD there   felt his prostrate CA to be stable and just suggested that he continue with the  Lupron shots. However, he has had more time to consider tikosyn and is not sure he wants to commit right now. He feels that his daily activities are not impacted by afib.   Today, he denies symptoms of palpitations, chest pain, shortness of breath, orthopnea, PND, lower extremity edema, dizziness, presyncope, syncope, or neurologic sequela. The patient is tolerating medications without difficulties and is otherwise without complaint today.   Past Medical History:  Diagnosis Date  . Diabetes mellitus without complication ([place])   . DVT (deep venous thrombosis) ([facility])    left leg.  [name] GERD (gastroesophageal reflux disease)   . Hypercholesterolemia   . Hypertension   . Prostate cancer ([place]) 12/06/13   gleason 4+3=7, 6/12 cores positive. seed implant and radiation   Past Surgical History:  Procedure Laterality Date  . APPENDECTOMY    . BALLOON DILATION N/A 08/02/2015   Procedure: BALLOON DILATION;  Surgeon: [name], MD;  Location: [facility];  Service: Endoscopy;  Laterality: N/A;  . CARDIOVERSION N/A 05/13/2018   Procedure: CARDIOVERSION;  Surgeon: Sanda Klein, MD;  Location: [facility];  Service: Cardiovascular;  Laterality: N/A;  . CARDIOVERSION N/A 05/18/2018   Procedure: CARDIOVERSION;  Surgeon: Buford Dresser, MD;  Location: [facility];  Service: Cardiovascular;  Laterality: N/A;  . COLONOSCOPY WITH PROPOFOL  N/A 08/02/2015   Procedure: COLONOSCOPY WITH PROPOFOL w/ APC;  Surgeon: [name], MD;  Location: [facility];  Service: Endoscopy;  Laterality: N/A;  . ESOPHAGOGASTRODUODENOSCOPY (EGD) WITH PROPOFOL N/A 08/02/2015   Procedure: ESOPHAGOGASTRODUODENOSCOPY (EGD) WITH PROPOFOL/ possible dilation.;  Surgeon: [name], MD;  Location: [facility];  Service: Endoscopy;  Laterality: N/A;  . KNEE SURGERY     right knee orthoscopic  . PROSTATE BIOPSY  12/06/13   Gleason 4+3=7, volume 35 gm  . [place]  .  TONSILLECTOMY      Current Outpatient Medications  Medication Sig Dispense Refill  . amLODipine (NORVASC) 2.5 MG tablet Take 1 tablet (2.5 mg total) by mouth daily. 90 tablet 1  . atorvastatin (LIPITOR) 40 MG tablet TAKE ONE TABLET BY MOUTH ONCE DAILY. 90 tablet 1  . blood glucose meter kit and supplies KIT Test blood sugar daily as directed. Dx code: E11.9 1 each 0  . calcium-vitamin D (OSCAL WITH D) 250-125 MG-UNIT tablet Take 1 tablet by mouth daily.    [name] CINNAMON PO Take 1,000 mg by mouth daily.    [name] Coral Calcium 1000 (390 Ca) MG TABS Take 1,000 mg by mouth daily.    . fluticasone (FLONASE) 50 MCG/ACT nasal spray Place 2 sprays into both nostrils daily as needed for allergies.  99  . glipiZIDE (GLUCOTROL XL) 2.5 MG 24 hr tablet TAKE 1 TABLET BY MOUTH ONCE DAILY WITH BREAKFAST (Patient taking differently: Take 2.5 mg by mouth daily with breakfast. ) 90 tablet 2  . INVOKAMET XR [phone] MG TB24 TAKE 2 TABLETS DAILY WITH BREAKFAST (Patient taking differently: Take 2 tablets by mouth every morning. ) 180 tablet 3  . leuprolide (LUPRON) 11.25 MG injection Inject 11.25 mg into the muscle every 3 (three) months.    [name] losartan (COZAAR) 50 MG tablet Take 1 tablet (50 mg total) by mouth daily. (Patient taking differently: Take 25 mg by mouth daily. ) 90 tablet 1  . metoprolol tartrate (LOPRESSOR) 100 MG tablet Take 1 tablet (100 mg total) by mouth 2 (two) times daily. 60 tablet 6  . omeprazole (PRILOSEC) 40 MG capsule One tablet daily as needed 90 capsule 3  . ONE TOUCH ULTRA TEST test strip CHECK TWICE A DAY 200 each 1  . rivaroxaban (XARELTO) 20 MG TABS tablet Take 1 tablet (20 mg total) by mouth daily with supper. 90 tablet 1  . Semaglutide (OZEMPIC) 0.25 or 0.5 MG/DOSE SOPN Inject 0.5 mg into the skin once a week. Inject 0.25m once weekly for the first two weeks, and then increase to 0.581mweekly. (Patient taking differently: Inject 0.5 mg into the skin every Friday. ) 3 pen 1  . Turmeric 450 MG  CAPS Take by mouth.    . Marland KitchenASCEPA 1 g CAPS TAKE 2 CAPSULES BY MOUTH TWICE DAILY 120 capsule 2  . Vitamin D, Cholecalciferol, 400 UNITS TABS Take 400 Units by mouth daily.     No current facility-administered medications for this encounter.     Allergies  Allergen Reactions  . Lupron [Leuprolide] Swelling and Other (See Comments)    Put in A-fib    Social History   Socioeconomic History  . Marital status: Married    Spouse name: Not on file  . Number of children: 1  . Years of education: Not on file  . Highest education level: Not on file  Occupational History    Employer: RETIRED  Social Needs  .  Financial resource strain: Not on file  . Food insecurity:    Worry: Not on file    Inability: Not on file  . Transportation needs:    Medical: Not on file    Non-medical: Not on file  Tobacco Use  . Smoking status: Former Smoker    Packs/day: 0.50    Years: 23.00    Pack years: 11.50    Types: Cigarettes    Last attempt to quit: 08/01/2012    Years since quitting: 5.9  . Smokeless tobacco: Never Used  Substance and Sexual Activity  . Alcohol use: Yes    Alcohol/week: 0.0 standard drinks    Comment: very seldom, maybe 3x per year  . Drug use: No  . Sexual activity: Yes    Birth control/protection: Abstinence  Lifestyle  . Physical activity:    Days per week: Not on file    Minutes per session: Not on file  . Stress: Not on file  Relationships  . Social connections:    Talks on phone: Not on file    Gets together: Not on file    Attends religious service: Not on file    Active member of club or organization: Not on file    Attends meetings of clubs or organizations: Not on file    Relationship status: Not on file  . Intimate partner violence:    Fear of current or ex partner: Not on file    Emotionally abused: Not on file    Physically abused: Not on file    Forced sexual activity: Not on file  Other Topics Concern  . 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 Heart disease Mother        before age 71  . Diabetes Mother   . Hyperlipidemia Mother   . Varicose Veins Mother   . Heart disease Father   . Diabetes Father   . Cancer Neg Hx     ROS- All systems are reviewed and negative except as per the HPI above  Physical Exam: Vitals:   07/27/18 0905  BP: 126/84  Pulse: 96  Weight: (!) 141.5 kg  Height: 5' 11" (1.803 m)   Wt Readings from Last 3 Encounters:  07/27/18 (!) 141.5 kg  06/13/18 132.5 kg  06/02/18 (!) 140.6 kg    Labs: Lab Results  Component Value Date   NA 142 05/12/2018   K 5.0 05/12/2018   CL 103 05/12/2018   CO2 22 05/12/2018   GLUCOSE 105 (H) 05/12/2018   BUN 23 05/12/2018   CREATININE 1.30 (H) 05/12/2018   CALCIUM 9.4 05/12/2018   MG 2.1 05/12/2017   Lab Results  Component Value Date   INR 1.55 (H) 10/19/2013   Lab Results  Component Value Date   CHOL 169 04/28/2018   HDL 32.20 (L) 04/28/2018   LDLCALC 104 (H) 04/28/2018   TRIG 166.0 (H) 04/28/2018     GEN- The patient is well appearing, alert and oriented x 3 today.   Head- normocephalic, atraumatic Eyes-  Sclera clear, conjunctiva pink Ears- hearing intact Oropharynx- clear Neck- supple, no JVP Lymph- no cervical lymphadenopathy  Lungs- Clear to ausculation bilaterally, normal work of breathing Heart- irregular rate and rhythm, no murmurs, rubs or gallops, PMI not laterally displaced GI- soft, NT, ND, + BS Extremities- no clubbing, cyanosis, or edema MS- no significant deformity or atrophy Skin- no rash or lesion Psych- euthymic mood, full affect Neuro- strength and sensation are intact  EKG-afib at 96 bpm, qrs int 80 ms, qtc 449 ms Echo-  04/28/18  -Study Conclusions  - Left ventricle: The cavity size was normal. Wall thickness was   increased in a pattern of moderate LVH. Systolic function was   normal. The estimated ejection fraction was in the range of 60%   to 65%. Wall motion was normal; there were no regional wall   motion abnormalities. The study is not technically sufficient to   allow evaluation of LV diastolic function. - Left atrium: The atrium was mildly dilated. Volume/bsa, ES   (1-plane Simpson&'s, A4C): 36.2 ml/m^2. Left atrium size 56 mm Echo- 2016-Study Conclusions  - Left ventricle: The cavity size was normal. Wall thickness was   increased in a pattern of mild LVH. There was mild focal basal   hypertrophy of the septum. Systolic function was normal. The   estimated ejection fraction was in the range of 55% to 60%. Wall   motion was normal; there were no regional wall motion   abnormalities. Doppler parameters are consistent with abnormal   left ventricular relaxation (grade 1 diastolic dysfunction).  Impressions:  - Normal LV function; grade 1 diastolic dysfunction.   Assessment and Plan: 1.Persisitent  afib  Pt had one successful cardioversion and felt great, unfortunately went back in  24 hours later after getting the flu shot, associated with fever, N/V  DCCV performed again and was unsuccessful  trying to  restore SR I think Tikosyn is the best best, but I worry untreated sleep apnea, obesity and left atrial size being  55 mm, may undermine long term ability to restore SR For now he wants to continue in rate controlled afib He is on xarelto 20 mg for a CHA2DS2VASc score of at least 5, states no missed doses  2. BMI of 44.13 Regular exercise and weight loss recommended   3. HTN Stable  4. OSA States cannot tolerate mask  5. Prostate CA Stable per second opinion,  to continue Lupron shots  F/u in 6 months   Butch Penny C. Carroll, [facility] [address] [place], [place] [address] [phone]

## 2018-08-04 ENCOUNTER — Other Ambulatory Visit (INDEPENDENT_AMBULATORY_CARE_PROVIDER_SITE_OTHER): Payer: BLUE CROSS/BLUE SHIELD

## 2018-08-04 DIAGNOSIS — E1165 Type 2 diabetes mellitus with hyperglycemia: Secondary | ICD-10-CM | POA: Diagnosis not present

## 2018-08-04 LAB — COMPREHENSIVE METABOLIC PANEL
ALT: 13 U/L (ref 0–53)
AST: 12 U/L (ref 0–37)
Albumin: 4.2 g/dL (ref 3.5–5.2)
Alkaline Phosphatase: 62 U/L (ref 39–117)
BUN: 28 mg/dL — ABNORMAL HIGH (ref 6–23)
CO2: 28 mEq/L (ref 19–32)
Calcium: 9.9 mg/dL (ref 8.4–10.5)
Chloride: 105 mEq/L (ref 96–112)
Creatinine, Ser: 1.3 mg/dL (ref 0.40–1.50)
GFR: 58.73 mL/min — ABNORMAL LOW (ref >60.00)
Glucose, Bld: 145 mg/dL — ABNORMAL HIGH (ref 70–99)
Potassium: 5.1 mEq/L (ref 3.5–5.1)
Sodium: 141 mEq/L (ref 135–145)
Total Bilirubin: 0.6 mg/dL (ref 0.2–1.2)
Total Protein: 7.3 g/dL (ref 6.0–8.3)

## 2018-08-04 LAB — HEMOGLOBIN A1C: Hgb A1c MFr Bld: 7.7 % — ABNORMAL HIGH (ref 4.6–6.5)

## 2018-08-09 NOTE — Progress Notes (Signed)
Patient ID: Tommy Yang, male   DOB: 1951/02/01, 67 y.o.   MRN: [id number]           Reason for Appointment: Follow-up for Type 2 Diabetes   History of Present Illness:          Diagnosis: Type 2 diabetes mellitus, date of diagnosis: 05/2013       Past history:   He apparently had significant hyperglycemia in 2014 when seen in the urgent care center but did not establish with a PCP for control. He was started on treatment for his diabetes in 10/2013 and he was initially treated with metformin and Januvia A few months later he was also given glipizide ER to help his control when his A1c had gone up to 10.9 He says he was also seen by dietitian but no record available of this. Subsequently his blood sugar control had improved with A1c down to 6.8 in 9/15 On his consultation in 3/16 he had high blood sugars and weight gain; A1c was 9.1% with taking glipizide ER, Januvia and metformin.  Recent history:    Non-insulin hypoglycemic drugs the patient is taking are: Glipizide ER 2.5 mg in am, Invokamet 150/1000, 2 tablets daily, Ozempic 0.5 mg weekly   His A1c has been as low as 6.1 but has been gradually increasing, now 7.7  Current blood sugar patterns and problems identified:  He again has had courses of steroids for his prostate issues and he thinks blood sugars will go up to 200 with this  He did not bring his monitor for download and not clear how often he is having high sugars  Although he thinks his blood sugars are below 130 in the morning his lab glucose was 145 fasting  Previously had postprandial hyperglycemia but he thinks his readings after meals are mostly around 130-140  On his last visit his creatinine had gone up to 1.6 but now is improved and he is continuing full doses of Invokana  No side effects with Ozempic  Side effects from medications have been: None  Compliance with the medical regimen: Fair  Hypoglycemia: None    Glucose monitoring:  done 1-2  times a day         Glucometer: One Touch.      Blood Glucose readings by recall     PRE-MEAL Fasting Lunch Dinner Bedtime Overall  Glucose range: 93-130      Mean/median:        POST-MEAL PC Breakfast PC Lunch PC Dinner  Glucose range:   140-160  Mean/median:      Readings from 05/2018:  PRE-MEAL Fasting Lunch Dinner Bedtime Overall  Glucose range:  99-175      Mean/median:  120  121    134+/-26   POST-MEAL PC Breakfast PC Lunch PC Dinner  Glucose range:    132-222  Mean/median:   134  174      Self-care: The diet that the patient has been following is: tries to limit  fats .      Meals: 3 meals per day. Breakfast is sometimes cereal,sausage  Fruits for snacks  Exercise:  Very little, limited by poor endurance  Dietician visit, most recent:?  2015 .               Weight history:    Wt Readings from Last 3 Encounters:  08/10/18 (!) 312 lb 12.8 oz (141.9 kg)  07/27/18 (!) 312 lb (141.5 kg)  06/13/18 292 lb (132.5 kg)  Glycemic control:   Lab Results  Component Value Date   HGBA1C 7.7 (H) 08/04/2018   HGBA1C 7.5 (H) 04/28/2018   HGBA1C 7.4 (H) 01/28/2018   Lab Results  Component Value Date   MICROALBUR 2.6 (H) 04/28/2018   LDLCALC 104 (H) 04/28/2018   CREATININE 1.30 08/04/2018    Lab on 08/04/2018  Component Date Value Ref Range Status  . Sodium 08/04/2018 141  135 - 145 mEq/L Final  . Potassium 08/04/2018 5.1  3.5 - 5.1 mEq/L Final  . Chloride 08/04/2018 105  96 - 112 mEq/L Final  . CO2 08/04/2018 28  19 - 32 mEq/L Final  . Glucose, Bld 08/04/2018 145* 70 - 99 mg/dL Final  . BUN 08/04/2018 28* 6 - 23 mg/dL Final  . Creatinine, Ser 08/04/2018 1.30  0.40 - 1.50 mg/dL Final  . Total Bilirubin 08/04/2018 0.6  0.2 - 1.2 mg/dL Final  . Alkaline Phosphatase 08/04/2018 62  39 - 117 U/L Final  . AST 08/04/2018 12  0 - 37 U/L Final  . ALT 08/04/2018 13  0 - 53 U/L Final  . Total Protein 08/04/2018 7.3  6.0 - 8.3 g/dL Final  . Albumin 08/04/2018 4.2  3.5 -  5.2 g/dL Final  . Calcium 08/04/2018 9.9  8.4 - 10.5 mg/dL Final  . GFR 08/04/2018 58.73* >60.00 mL/min Final  . Hgb A1c MFr Bld 08/04/2018 7.7* 4.6 - 6.5 % Final   Glycemic Control Guidelines for People with Diabetes:Non Diabetic:  <6%Goal of Therapy: <7%Additional Action Suggested:  >8%       Allergies as of 08/10/2018      Reactions   Lupron [leuprolide] Swelling, Other (See Comments)   Put in A-fib      Medication List        Accurate as of 08/10/18  8:01 AM. Always use your most recent med list.          amLODipine 2.5 MG tablet Commonly known as:  NORVASC Take 1 tablet (2.5 mg total) by mouth daily.   atorvastatin 40 MG tablet Commonly known as:  LIPITOR TAKE ONE TABLET BY MOUTH ONCE DAILY.   blood glucose meter kit and supplies Kit Test blood sugar daily as directed. Dx code: E11.9   calcium-vitamin D 250-125 MG-UNIT tablet Commonly known as:  OSCAL WITH D Take 1 tablet by mouth daily.   CINNAMON PO Take 1,000 mg by mouth daily.   Coral Calcium 1000 (390 Ca) MG Tabs Take 1,000 mg by mouth daily.   fluticasone 50 MCG/ACT nasal spray Commonly known as:  FLONASE Place 2 sprays into both nostrils daily as needed for allergies.   glipiZIDE 2.5 MG 24 hr tablet Commonly known as:  GLUCOTROL XL TAKE 1 TABLET BY MOUTH ONCE DAILY WITH BREAKFAST   INVOKAMET XR [phone] MG Tb24 Generic drug:  Canagliflozin-metFORMIN HCl ER TAKE 2 TABLETS DAILY WITH BREAKFAST   leuprolide 11.25 MG injection Commonly known as:  LUPRON Inject 11.25 mg into the muscle every 3 (three) months.   losartan 50 MG tablet Commonly known as:  COZAAR Take 1 tablet (50 mg total) by mouth daily.   metoprolol tartrate 100 MG tablet Commonly known as:  LOPRESSOR Take 1 tablet (100 mg total) by mouth 2 (two) times daily.   omeprazole 40 MG capsule Commonly known as:  PRILOSEC One tablet daily as needed   ONE TOUCH ULTRA TEST test strip Generic drug:  glucose blood CHECK TWICE A  DAY   rivaroxaban 20 MG Tabs tablet  Commonly known as:  XARELTO Take 1 tablet (20 mg total) by mouth daily with supper.   Semaglutide(0.25 or 0.5MG/DOS) 2 MG/1.5ML Sopn Inject 0.5 mg into the skin once a week. Inject 0.45m once weekly for the first two weeks, and then increase to 0.537mweekly.   Turmeric 450 MG Caps Take by mouth.   VASCEPA 1 g Caps Generic drug:  Icosapent Ethyl TAKE 2 CAPSULES BY MOUTH TWICE DAILY   Vitamin D (Cholecalciferol) 10 MCG (400 UNIT) Tabs Take 400 Units by mouth daily.       Allergies:  Allergies  Allergen Reactions  . Lupron [Leuprolide] Swelling and Other (See Comments)    Put in A-fib    Past Medical History:  Diagnosis Date  . [place]  . DVT (deep venous thrombosis) (H[facility])    left leg.  . Marland KitchenERD (gastroesophageal reflux disease)   . Hypercholesterolemia   . Hypertension   . [place]state [place]   gleason 4+3=7, 6/12 cores positive. seed implant and radiation    Past Surgical History:  Procedure Laterality Date  . APPENDECTOMY    . BALLOON DILATION N/A 08/02/2015   Procedure: BALLOON DILATION;  Surgeon: DaMilus BanisterMD;  [facility];  Service: Endoscopy;  Laterality: N/A;  . CARDIOVERSION N/A 05/13/2018   Procedure: CARDIOVERSION;  Surgeon: CrSanda KleinMD;  [facility];  Service: Cardiovascular;  Laterality: N/A;  . CARDIOVERSION N/A 05/18/2018   Procedure: CARDIOVERSION;  Surgeon: ChBuford DresserMD;  [place] [facility] Service: Cardiovascular;  Laterality: N/A;  . COLONOSCOPY WITH PROPOFOL N/A 08/02/2015   Procedure: COLONOSCOPY WITH PROPOFOL w/ APC;  Surgeon: DaMilus BanisterMD;  [facility];  Service: Endoscopy;  Laterality: N/A;  . ESOPHAGOGASTRODUODENOSCOPY (EGD) WITH PROPOFOL N/A 08/02/2015   Procedure: ESOPHAGOGASTRODUODENOSCOPY (EGD) WITH PROPOFOL/ possible dilation.;  Surgeon: DaMilus BanisterMD;  [facility];   Service: Endoscopy;  Laterality: N/A;  . KNEE SURGERY     right knee orthoscopic  . PROSTATE BIOPSY  12/06/13   Gleason 4+3=7, volume 35 gm  . [place]. TONSILLECTOMY      Family History  Problem Relation Age of Onset  . Heart disease Mother        before age 866. Diabetes Mother   . Hyperlipidemia Mother   . Varicose Veins Mother   . Heart disease Father   . Diabetes Father   . Cancer Neg Hx     Social History:  reports that he quit smoking about 6 years ago. His smoking use included cigarettes. He has a 11.50 pack-year smoking history. He has never used smokeless tobacco. He reports that he drinks alcohol. He reports that he does not use drugs.    Review of Systems        Lipids: he has had significant hyperlipidemia and has been treated with Lipitor 40 mg daily and is also taking Vascepa for triglycerides Has no history of CAD His diet is relatively low in fat  Continues to have low HDL Triglycerides are slightly high and also last LDL is above 100  To be seen by a new PCP next month Lipitor has been prescribed by his PCP       Lab Results  Component Value Date   CHOL 169 04/28/2018   CHOL 153 10/29/2017   CHOL 167 08/03/2017   Lab Results  Component Value Date   HDL  32.20 (L) 04/28/2018   HDL 30.40 (L) 10/29/2017   HDL 32.00 (L) 08/03/2017   Lab Results  Component Value Date   LDLCALC 104 (H) 04/28/2018   LDLCALC 92 10/29/2017   LDLCALC 102 (H) 08/03/2017   Lab Results  Component Value Date   TRIG 166.0 (H) 04/28/2018   TRIG 152.0 (H) 10/29/2017   TRIG 164.0 (H) 08/03/2017   Lab Results  Component Value Date   CHOLHDL 5 04/28/2018   CHOLHDL 5 10/29/2017   CHOLHDL 5 08/03/2017   Lab Results  Component Value Date   LDLDIRECT 93.0 12/30/2016   LDLDIRECT 103.0 02/08/2015                   The blood pressure has been treated with losartan 25 mg and low-dose metoprolol, followed by PCP and  cardiologist Also recently not on Lasix for edema   BP Readings from Last 3 Encounters:  08/10/18 130/78  07/27/18 126/84  06/13/18 127/89    RENAL function is improved, previously worse with Lasix  Lab Results  Component Value Date   CREATININE 1.30 08/04/2018   CREATININE 1.30 (H) 05/12/2018   CREATININE 1.60 (H) 04/28/2018    Followed by cardiologist for atrial fibrillation history  LABS:  Lab on 08/04/2018  Component Date Value Ref Range Status  . Sodium 08/04/2018 141  135 - 145 mEq/L Final  . Potassium 08/04/2018 5.1  3.5 - 5.1 mEq/L Final  . Chloride 08/04/2018 105  96 - 112 mEq/L Final  . CO2 08/04/2018 28  19 - 32 mEq/L Final  . Glucose, Bld 08/04/2018 145* 70 - 99 mg/dL Final  . BUN 08/04/2018 28* 6 - 23 mg/dL Final  . Creatinine, Ser 08/04/2018 1.30  0.40 - 1.50 mg/dL Final  . Total Bilirubin 08/04/2018 0.6  0.2 - 1.2 mg/dL Final  . Alkaline Phosphatase 08/04/2018 62  39 - 117 U/L Final  . AST 08/04/2018 12  0 - 37 U/L Final  . ALT 08/04/2018 13  0 - 53 U/L Final  . Total Protein 08/04/2018 7.3  6.0 - 8.3 g/dL Final  . Albumin 08/04/2018 4.2  3.5 - 5.2 g/dL Final  . Calcium 08/04/2018 9.9  8.4 - 10.5 mg/dL Final  . GFR 08/04/2018 58.73* >60.00 mL/min Final  . Hgb A1c MFr Bld 08/04/2018 7.7* 4.6 - 6.5 % Final   Glycemic Control Guidelines for People with Diabetes:Non Diabetic:  <6%Goal of Therapy: <7%Additional Action Suggested:  >8%     Physical Examination:  BP 130/78 (BP Location: Left Arm, Patient Position: Sitting, Cuff Size: Normal)   Pulse 96   Ht '5\' 11"'  (1.803 m)   Wt (!) 312 lb 12.8 oz (141.9 kg)   SpO2 94%   BMI 43.63 kg/m   Diabetes type 2, morbidly obese, non-insulin-dependent   See history of present illness for detailed discussion of his current management, blood sugar patterns and problems identified   His A1c is still relatively high at 7.7  Although he thinks that his A1c is higher because of recurrent use of prednisone his recent  blood sugars were not evaluated because he did not bring his monitor He is still not able to do much exercise or lose weight He is on Invokana and Ozempic but not on maximum doses of Ozempic at present He says he is going to be working with a nutritionist with his prostate management team in [place] and has been told to lose weight also   HYPERTENSION: Mild and well-controlled  Mixed dyslipidemia: He has been managed by PCP and will defer further evaluation and adjustment of medications to his new PCP next month     PLAN:  Since he can do better with his A1c overall can lose weight with a higher dose of Ozempic he will go up to 1.0 mg now He will use up the last 2 doses on his 0.5 mg device 2 injections on the next due date and then start with 1 mg weekly encouraged him to be as active as possible Follow-up in 3 months and review of his home readings also at that time To discuss lipids with PCP Need to bring his monitor for download on next visit   There are no Patient Instructions on file for this visit.       Elayne Snare 08/10/2018, 8:01 AM   Note: This office note was prepared with Dragon voice recognition system technology. Any transcriptional errors that result from this process are unintentional.

## 2018-08-10 ENCOUNTER — Encounter: Payer: Self-pay | Admitting: Endocrinology

## 2018-08-10 ENCOUNTER — Other Ambulatory Visit: Payer: Self-pay

## 2018-08-10 ENCOUNTER — Ambulatory Visit (INDEPENDENT_AMBULATORY_CARE_PROVIDER_SITE_OTHER): Payer: BLUE CROSS/BLUE SHIELD | Admitting: Endocrinology

## 2018-08-10 VITALS — BP 130/78 | HR 96 | Ht 71.0 in | Wt 312.8 lb

## 2018-08-10 DIAGNOSIS — E1165 Type 2 diabetes mellitus with hyperglycemia: Secondary | ICD-10-CM

## 2018-08-10 MED ORDER — SEMAGLUTIDE (1 MG/DOSE) 2 MG/1.5ML ~~LOC~~ SOPN: 1.0000 mg | PEN_INJECTOR | SUBCUTANEOUS | 3 refills | Status: DC

## 2018-08-10 NOTE — Patient Instructions (Signed)
Check blood sugars on waking up days a week  Also check blood sugars about 2 hours after meals and do this after different meals by rotation  Recommended blood sugar levels on waking up are 90-130 and about 2 hours after meal is 130-160  Please bring your blood sugar monitor to each visit, thank you  Ozempic 1mg  weekly

## 2018-08-13 ENCOUNTER — Ambulatory Visit: Payer: BLUE CROSS/BLUE SHIELD | Admitting: Family Medicine

## 2018-08-28 ENCOUNTER — Other Ambulatory Visit: Payer: Self-pay | Admitting: Endocrinology

## 2018-09-14 ENCOUNTER — Other Ambulatory Visit: Payer: Self-pay | Admitting: Endocrinology

## 2018-09-21 ENCOUNTER — Telehealth: Payer: Self-pay | Admitting: Endocrinology

## 2018-09-21 ENCOUNTER — Other Ambulatory Visit: Payer: Self-pay

## 2018-09-21 MED ORDER — EMPAGLIFLOZIN-METFORMIN HCL 12.5-1000 MG PO TABS: 2.0000 | ORAL_TABLET | Freq: Every day | ORAL | 3 refills | Status: DC

## 2018-09-21 NOTE — Telephone Encounter (Signed)
I presume this is from insurance denial.  He can change to Synjardy XR 12.12/998, 2 tablets daily

## 2018-09-21 NOTE — Telephone Encounter (Signed)
Called pt and left message with his brother to return the phone call.

## 2018-09-21 NOTE — Telephone Encounter (Signed)
Patient states all meds need to be 30 days and the pharmacy used is only      [facility] [address] [place] [phone] (Phone) [phone] (Fax)    Also per Invokamet - needs this changed to the generics  "Synjardy" or "Merleen Nicely"  Patient requests a call back to [phone] regarding these changes.

## 2018-09-21 NOTE — Telephone Encounter (Signed)
Are you okay with changing pt to [place] or Xigduo?

## 2018-09-21 NOTE — Telephone Encounter (Signed)
Patient is returning a call from the office

## 2018-09-22 NOTE — Telephone Encounter (Signed)
Called pt and gave him MD message. Pt verbalized understanding. 

## 2018-09-24 ENCOUNTER — Encounter: Payer: Self-pay | Admitting: Nurse Practitioner

## 2018-09-24 ENCOUNTER — Ambulatory Visit: Payer: BLUE CROSS/BLUE SHIELD | Admitting: Nurse Practitioner

## 2018-09-24 ENCOUNTER — Other Ambulatory Visit (INDEPENDENT_AMBULATORY_CARE_PROVIDER_SITE_OTHER): Payer: BLUE CROSS/BLUE SHIELD

## 2018-09-24 DIAGNOSIS — I10 Essential (primary) hypertension: Secondary | ICD-10-CM | POA: Diagnosis not present

## 2018-09-24 DIAGNOSIS — E785 Hyperlipidemia, unspecified: Secondary | ICD-10-CM

## 2018-09-24 DIAGNOSIS — I4891 Unspecified atrial fibrillation: Secondary | ICD-10-CM | POA: Diagnosis not present

## 2018-09-24 DIAGNOSIS — I825Z9 Chronic embolism and thrombosis of unspecified deep veins of unspecified distal lower extremity: Secondary | ICD-10-CM

## 2018-09-24 DIAGNOSIS — I4819 Other persistent atrial fibrillation: Secondary | ICD-10-CM

## 2018-09-24 LAB — CBC
HCT: 46.1 % (ref 39.0–52.0)
Hemoglobin: 15.1 g/dL (ref 13.0–17.0)
MCHC: 32.8 g/dL (ref 30.0–36.0)
MCV: 84.8 fl (ref 78.0–100.0)
Platelets: 239 10*3/uL (ref 150.0–400.0)
RBC: 5.44 Mil/uL (ref 4.22–5.81)
RDW: 15.5 % (ref 11.5–15.5)
WBC: 10.2 10*3/uL (ref 4.0–10.5)

## 2018-09-24 LAB — COMPREHENSIVE METABOLIC PANEL
ALT: 15 U/L (ref 0–53)
AST: 13 U/L (ref 0–37)
Albumin: 4.3 g/dL (ref 3.5–5.2)
Alkaline Phosphatase: 69 U/L (ref 39–117)
BUN: 22 mg/dL (ref 6–23)
CO2: 27 mEq/L (ref 19–32)
Calcium: 10 mg/dL (ref 8.4–10.5)
Chloride: 104 mEq/L (ref 96–112)
Creatinine, Ser: 1.24 mg/dL (ref 0.40–1.50)
GFR: 58.33 mL/min — ABNORMAL LOW (ref >60.00)
Glucose, Bld: 170 mg/dL — ABNORMAL HIGH (ref 70–99)
Potassium: 4.6 mEq/L (ref 3.5–5.1)
Sodium: 141 mEq/L (ref 135–145)
Total Bilirubin: 0.6 mg/dL (ref 0.2–1.2)
Total Protein: 7.4 g/dL (ref 6.0–8.3)

## 2018-09-24 MED ORDER — LOSARTAN POTASSIUM 50 MG PO TABS: 50.0000 mg | ORAL_TABLET | Freq: Every day | ORAL | 1 refills | Status: DC

## 2018-09-24 MED ORDER — ATORVASTATIN CALCIUM 40 MG PO TABS: ORAL_TABLET | ORAL | 1 refills | Status: DC

## 2018-09-24 MED ORDER — RIVAROXABAN 20 MG PO TABS: 20.0000 mg | ORAL_TABLET | Freq: Every day | ORAL | 1 refills | Status: DC

## 2018-09-24 MED ORDER — AMLODIPINE BESYLATE 2.5 MG PO TABS: 2.5000 mg | ORAL_TABLET | Freq: Every day | ORAL | 1 refills | Status: DC

## 2018-09-24 NOTE — Assessment & Plan Note (Signed)
Stable Continue current medications Home management, healthy lifestyle discussed/printed on AVS Update labs - amLODipine (NORVASC) 2.5 MG tablet; Take 1 tablet (2.5 mg total) by mouth daily.  Dispense: 90 tablet; Refill: 1 - losartan (COZAAR) 50 MG tablet; Take 1 tablet (50 mg total) by mouth daily.  Dispense: 90 tablet; Refill: 1 - CBC; Future - Comprehensive metabolic panel; Future

## 2018-09-24 NOTE — Progress Notes (Signed)
Tommy Yang is a 68 y.o. male with the following history as recorded in EpicCare:  Patient Active Problem List   Diagnosis Date Noted  . Essential hypertension 09/24/2018  . Persistent atrial fibrillation   . Microalbuminuria 04/23/2016  . Atrial fibrillation (HCC) 12/29/2015  . Rectal bleeding 07/18/2015  . Dysphagia 07/18/2015  . Chronic anticoagulation 03/14/2015  . Morbid obesity (HCC) 03/14/2015  . Exertional shortness of breath 01/12/2015  . Abnormal ECG 01/12/2015  . Encounter for screening colonoscopy 05/09/2014  . Esophageal reflux 05/09/2014  . Other dysphagia 05/09/2014  . Evaluate for OSA (obstructive sleep apnea) 02/28/2014  . Hyperlipidemia 02/28/2014  . Prostate cancer (HCC) 12/14/2013  . PSA elevation 10/26/2013  . Erectile dysfunction 10/26/2013  . Type II diabetes mellitus, uncontrolled (HCC) 10/19/2013  . DVT (deep venous thrombosis) (HCC)   . Left leg DVT (HCC) 09/28/2013  . Leg edema, left 09/28/2013    Current Outpatient Medications  Medication Sig Dispense Refill  . amLODipine (NORVASC) 2.5 MG tablet Take 1 tablet (2.5 mg total) by mouth daily. 90 tablet 1  . atorvastatin (LIPITOR) 40 MG tablet TAKE ONE TABLET BY MOUTH ONCE DAILY. 90 tablet 1  . blood glucose meter kit and supplies KIT Test blood sugar daily as directed. Dx code: E11.9 1 each 0  . calcium-vitamin D (OSCAL WITH D) 250-125 MG-UNIT tablet Take 1 tablet by mouth daily.    . CINNAMON PO Take 1,000 mg by mouth daily.    . [place] Calcium 1000 (390 Ca) MG TABS Take 1,000 mg by mouth daily.    . Empagliflozin-metFORMIN HCl (SYNJARDY) 12.12-998 MG TABS Take 2 tablets by mouth daily. 60 tablet 3  . fluticasone (FLONASE) 50 MCG/ACT nasal spray Place 2 sprays into both nostrils daily as needed for allergies.  99  . glipiZIDE (GLUCOTROL XL) 2.5 MG 24 hr tablet TAKE 1 TABLET BY MOUTH ONCE DAILY WITH BREAKFAST (Patient taking differently: Take 2.5 mg by mouth daily with breakfast. ) 90 tablet 2  .  leuprolide (LUPRON) 11.25 MG injection Inject 11.25 mg into the muscle every 3 (three) months.    . losartan (COZAAR) 50 MG tablet Take 1 tablet (50 mg total) by mouth daily. 90 tablet 1  . metoprolol tartrate (LOPRESSOR) 100 MG tablet Take 1 tablet (100 mg total) by mouth 2 (two) times daily. 60 tablet 6  . ONE TOUCH ULTRA TEST test strip CHECK TWICE A DAY 200 each 3  . rivaroxaban (XARELTO) 20 MG TABS tablet Take 1 tablet (20 mg total) by mouth daily with supper. 90 tablet 1  . Semaglutide, 1 MG/DOSE, (OZEMPIC, 1 MG/DOSE,) 2 MG/1.5ML SOPN Inject 1 mg into the skin once a week. Inject 1mg under the skin once weekly. 2 pen 3  . Turmeric 450 MG CAPS Take by mouth.    . VASCEPA 1 g CAPS TAKE 2 CAPSULES BY MOUTH TWICE DAILY 120 capsule 0  . Vitamin D, Cholecalciferol, 400 UNITS TABS Take 400 Units by mouth daily.    . omeprazole (PRILOSEC) 40 MG capsule One tablet daily as needed (Patient not taking: Reported on 09/24/2018) 90 capsule 3   No current facility-administered medications for this visit.     Allergies: Lupron [leuprolide]  Past Medical History:  Diagnosis Date  . Atrial fibrillation (HCC)   . Diabetes mellitus without complication (HCC)   . DVT (deep venous thrombosis) (HCC)    left leg.  . GERD (gastroesophageal reflux disease)   . Hypercholesterolemia   . Hypertension   .   Prostate cancer (Tommy Yang) 12/06/13   gleason 4+3=7, 6/12 cores positive. seed implant and radiation    Past Surgical History:  Procedure Laterality Date  . APPENDECTOMY    . BALLOON DILATION N/A 08/02/2015   Procedure: BALLOON DILATION;  Surgeon: [name], MD;  Location: [facility];  Service: Endoscopy;  Laterality: N/A;  . CARDIOVERSION N/A 05/13/2018   Procedure: CARDIOVERSION;  Surgeon: Sanda Klein, MD;  Location: [facility];  Service: Cardiovascular;  Laterality: N/A;  . CARDIOVERSION N/A 05/18/2018   Procedure: CARDIOVERSION;  Surgeon: Buford Dresser, MD;  Location: [facility];   Service: Cardiovascular;  Laterality: N/A;  . COLONOSCOPY WITH PROPOFOL N/A 08/02/2015   Procedure: COLONOSCOPY WITH PROPOFOL w/ APC;  Surgeon: [name], MD;  Location: [facility];  Service: Endoscopy;  Laterality: N/A;  . ESOPHAGOGASTRODUODENOSCOPY (EGD) WITH PROPOFOL N/A 08/02/2015   Procedure: ESOPHAGOGASTRODUODENOSCOPY (EGD) WITH PROPOFOL/ possible dilation.;  Surgeon: [name], MD;  Location: [facility];  Service: Endoscopy;  Laterality: N/A;  . KNEE SURGERY     right knee orthoscopic  . PROSTATE BIOPSY  12/06/13   Gleason 4+3=7, volume 35 gm  . [place]  . TONSILLECTOMY      Family History  Problem Relation Age of Onset  . Heart disease Mother        before age 41  . Diabetes Mother   . Hyperlipidemia Mother   . Varicose Veins Mother   . Hypertension Mother   . Heart disease Father   . Diabetes Father   . Hyperlipidemia Father   . Hypertension Father   . Cancer Brother   . COPD Brother   . Diabetes Brother   . Hyperlipidemia Brother   . Hypertension Brother     Social History   Tobacco Use  . Smoking status: Former Smoker    Packs/day: 0.50    Years: 23.00    Pack years: 11.50    Types: Cigarettes    Last attempt to quit: 08/01/2012    Years since quitting: 6.1  . Smokeless tobacco: Never Used  Substance Use Topics  . Alcohol use: Yes    Alcohol/week: 0.0 standard drinks    Comment: very seldom, maybe 3x per year     Subjective:  Tommy Yang is here today to establish care as a new patient to our practice, transferring from prior provider due to wanting a change. 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 Aside from primary care, he is routinely followed by endocrinology for diabetes, cardiology for persistent atrial fib, oncology for prostate cancer, says he sees all of his specialists every 3 months. We will review his current daily medications/chronic conditions managed by prior PCP  today.  Chronic DVT/atrial fib- Reports long history of chronic thrombus to LLE, as well as atrial fib, actually followed by cardiology atrial fib clinic as well, has been on xarelto 20 daily for some time, denies adverse reactions, abnormal bleeding, was told by prior provider he would need to stay on xarelto forever, and that he would need annual ultrasound of LE to monitor chronic thrombus, requesting this ultrasound today. No past history PE  Hypertension -maintained on amlodipine 2.5 daily, losartan 50 daily, metoprolol 100 BID . Reports daily medication compliance without noted adverse medication effects. Reports he checks his blood pressure and pulse rate twice weekly with normal BP readings and pulse in 80s recently.  BP Readings from Last 3 Encounters:  09/24/18  104/70  08/10/18 130/78  07/27/18 126/84    HLD- maintained on atorvastatin 40, vascepa 1g daily Reports daily medication compliance without noted adverse medication effects.  Lab Results  Component Value Date   CHOL 169 04/28/2018   HDL 32.20 (L) 04/28/2018   LDLCALC 104 (H) 04/28/2018   LDLDIRECT 93.0 12/30/2016   TRIG 166.0 (H) 04/28/2018   CHOLHDL 5 04/28/2018    Review of Systems  Constitutional: Negative for chills and fever.  HENT: Negative for hearing loss.   Eyes: Negative for blurred vision and double vision.  Respiratory: Negative for cough and shortness of breath.   Cardiovascular: Negative for chest pain, palpitations, claudication and leg swelling.  Gastrointestinal: Negative for abdominal pain, blood in stool, constipation, diarrhea, nausea and vomiting.  Genitourinary: Negative for dysuria and hematuria.  Musculoskeletal: Negative for falls.  Skin: Negative for rash.  Neurological: Negative for speech change, loss of consciousness, weakness and headaches.  Endo/Heme/Allergies: Negative for environmental allergies. Does not bruise/bleed easily.  Psychiatric/Behavioral: Negative for depression.  The patient is not nervous/anxious.    Objective:  Vitals:   09/24/18 0817  BP: 104/70  Pulse: 73  SpO2: 94%  Weight: (!) 313 lb (142 kg)  Height: 5' 11" (1.803 m)    General: Well developed, well nourished, in no acute distress  Skin : Warm and dry.  Head: Normocephalic and atraumatic  Eyes: Sclera and conjunctiva clear; pupils round and reactive to light; extraocular movements intact  Oropharynx: Pink, supple. No suspicious lesions  Neck: Supple Lungs: Respirations unlabored; clear to auscultation bilaterally without wheeze, rales, rhonchi  CVS exam: irregularly irregular rhythm with rate 73.  Extremities: No edema, cyanosis, clubbing  Vessels: Symmetric bilaterally  Neurologic: Alert and oriented; speech intact; face symmetrical; moves all extremities well; CNII-XII intact without focal deficit  Psychiatric: Normal mood and affect.  Assessment:  1. Essential hypertension   2. Hyperlipidemia, unspecified hyperlipidemia type   3. 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 Atrial fibrillation, unspecified type ([place])   5. Persistent atrial fibrillation     Plan:   Return in about 6 months (around 03/25/2019) for CPE.  Orders Placed This Encounter  Procedures  . CBC    Standing Status:   Future    Number of Occurrences:   1    Standing Expiration Date:   09/25/2019  . Comprehensive metabolic panel    Standing Status:   Future    Number of Occurrences:   1    Standing Expiration Date:   09/25/2019    Requested Prescriptions   Signed Prescriptions Disp Refills  . amLODipine (NORVASC) 2.5 MG tablet 90 tablet 1    Sig: Take 1 tablet (2.5 mg total) by mouth daily.  [name] atorvastatin (LIPITOR) 40 MG tablet 90 tablet 1    Sig: TAKE ONE TABLET BY MOUTH ONCE DAILY.  [name] losartan (COZAAR) 50 MG tablet 90 tablet 1    Sig: Take 1 tablet (50 mg total) by mouth daily.  . rivaroxaban (XARELTO) 20 MG TABS tablet 90 tablet 1     Sig: Take 1 tablet (20 mg total) by mouth daily with supper.

## 2018-09-24 NOTE — Patient Instructions (Signed)
Head downstairs for labs  You will be called to schedule ultrasound  I will see you in 6 months for annual physical, or sooner if needed   Hypertension Hypertension, commonly called high blood pressure, is when the force of blood pumping through the arteries is too strong. The arteries are the blood vessels that carry blood from the heart throughout the body. Hypertension forces the heart to work harder to pump blood and may cause arteries to become narrow or stiff. Having untreated or uncontrolled hypertension can cause heart attacks, strokes, kidney disease, and other problems. A blood pressure reading consists of a higher number over a lower number. Ideally, your blood pressure should be below 120/80. The first ("top") number is called the systolic pressure. It is a measure of the pressure in your arteries as your heart beats. The second ("bottom") number is called the diastolic pressure. It is a measure of the pressure in your arteries as the heart relaxes. What are the causes? The cause of this condition is not known. What increases the risk? Some risk factors for high blood pressure are under your control. Others are not. Factors you can change  Smoking.  Having type 2 diabetes mellitus, high cholesterol, or both.  Not getting enough exercise or physical activity.  Being overweight.  Having too much fat, sugar, calories, or salt (sodium) in your diet.  Drinking too much alcohol. Factors that are difficult or impossible to change  Having chronic kidney disease.  Having a family history of high blood pressure.  Age. Risk increases with age.  Race. You may be at higher risk if you are African-American.  Gender. Men are at higher risk than women before age 4. After age 20, women are at higher risk than men.  Having obstructive sleep apnea.  Stress. What are the signs or symptoms? Extremely high blood pressure (hypertensive crisis) may  cause:  Headache.  Anxiety.  Shortness of breath.  Nosebleed.  Nausea and vomiting.  Severe chest pain.  Jerky movements you cannot control (seizures). How is this diagnosed? This condition is diagnosed by measuring your blood pressure while you are seated, with your arm resting on a surface. The cuff of the blood pressure monitor will be placed directly against the skin of your upper arm at the level of your heart. It should be measured at least twice using the same arm. Certain conditions can cause a difference in blood pressure between your right and left arms. Certain factors can cause blood pressure readings to be lower or higher than normal (elevated) for a short period of time:  When your blood pressure is higher when you are in a health care provider's office than when you are at home, this is called white coat hypertension. Most people with this condition do not need medicines.  When your blood pressure is higher at home than when you are in a health care provider's office, this is called masked hypertension. Most people with this condition may need medicines to control blood pressure. If you have a high blood pressure reading during one visit or you have normal blood pressure with other risk factors:  You may be asked to return on a different day to have your blood pressure checked again.  You may be asked to monitor your blood pressure at home for 1 week or longer. If you are diagnosed with hypertension, you may have other blood or imaging tests to help your health care provider understand your overall risk for other conditions.  How is this treated? This condition is treated by making healthy lifestyle changes, such as eating healthy foods, exercising more, and reducing your alcohol intake. Your health care provider may prescribe medicine if lifestyle changes are not enough to get your blood pressure under control, and if:  Your systolic blood pressure is above 130.  Your  diastolic blood pressure is above 80. Your personal target blood pressure may vary depending on your medical conditions, your age, and other factors. Follow these instructions at home: Eating and drinking   Eat a diet that is high in fiber and potassium, and low in sodium, added sugar, and fat. An example eating plan is called the DASH (Dietary Approaches to Stop Hypertension) diet. To eat this way: ? Eat plenty of fresh fruits and vegetables. Try to fill half of your plate at each meal with fruits and vegetables. ? Eat whole grains, such as whole wheat pasta, brown rice, or whole grain bread. Fill about one quarter of your plate with whole grains. ? Eat or drink low-fat dairy products, such as skim milk or low-fat yogurt. ? Avoid fatty cuts of meat, processed or cured meats, and poultry with skin. Fill about one quarter of your plate with lean proteins, such as fish, chicken without skin, beans, eggs, and tofu. ? Avoid premade and processed foods. These tend to be higher in sodium, added sugar, and fat.  Reduce your daily sodium intake. Most people with hypertension should eat less than 1,500 mg of sodium a day.  Limit alcohol intake to no more than 1 drink a day for nonpregnant women and 2 drinks a day for men. One drink equals 12 oz of beer, 5 oz of wine, or 1 oz of hard liquor. Lifestyle   Work with your health care provider to maintain a healthy body weight or to lose weight. Ask what an ideal weight is for you.  Get at least 30 minutes of exercise that causes your heart to beat faster (aerobic exercise) most days of the week. Activities may include walking, swimming, or biking.  Include exercise to strengthen your muscles (resistance exercise), such as pilates or lifting weights, as part of your weekly exercise routine. Try to do these types of exercises for 30 minutes at least 3 days a week.  Do not use any products that contain nicotine or tobacco, such as cigarettes and  e-cigarettes. If you need help quitting, ask your health care provider.  Monitor your blood pressure at home as told by your health care provider.  Keep all follow-up visits as told by your health care provider. This is important. Medicines  Take over-the-counter and prescription medicines only as told by your health care provider. Follow directions carefully. Blood pressure medicines must be taken as prescribed.  Do not skip doses of blood pressure medicine. Doing this puts you at risk for problems and can make the medicine less effective.  Ask your health care provider about side effects or reactions to medicines that you should watch for. Contact a health care provider if:  You think you are having a reaction to a medicine you are taking.  You have headaches that keep coming back (recurring).  You feel dizzy.  You have swelling in your ankles.  You have trouble with your vision. Get help right away if:  You develop a severe headache or confusion.  You have unusual weakness or numbness.  You feel faint.  You have severe pain in your chest or abdomen.  You vomit  repeatedly.  You have trouble breathing. Summary  Hypertension is when the force of blood pumping through your arteries is too strong. If this condition is not controlled, it may put you at risk for serious complications.  Your personal target blood pressure may vary depending on your medical conditions, your age, and other factors. For most people, a normal blood pressure is less than 120/80.  Hypertension is treated with lifestyle changes, medicines, or a combination of both. Lifestyle changes include weight loss, eating a healthy, low-sodium diet, exercising more, and limiting alcohol. This information is not intended to replace advice given to you by your health care provider. Make sure you discuss any questions you have with your health care provider. Document Released: 08/18/2005 Document Revised: 07/16/2016  Document Reviewed: 07/16/2016 Elsevier Interactive Patient Education  2019 [employer].

## 2018-09-24 NOTE — Assessment & Plan Note (Signed)
Continue current medications Update labs/US Could consider hematology consult, not sure that hes ever seen hematology - rivaroxaban (XARELTO) 20 MG TABS tablet; Take 1 tablet (20 mg total) by mouth daily with supper.  Dispense: 90 tablet; Refill: 1 - CBC; Future - Comprehensive metabolic panel; Future - VAS [name] LOWER EXTREMITY VENOUS (DVT); Future

## 2018-09-24 NOTE — Assessment & Plan Note (Signed)
Continue current medications Continue regular follow up with cardiology Update labs - rivaroxaban (XARELTO) 20 MG TABS tablet; Take 1 tablet (20 mg total) by mouth daily with supper.  Dispense: 90 tablet; Refill: 1 - CBC; Future - Comprehensive metabolic panel; Future - VAS [name] LOWER EXTREMITY VENOUS (DVT); Future

## 2018-09-24 NOTE — Assessment & Plan Note (Signed)
Continue current medications Home management, healthy lifestyle discussed/printed on AVS Lipid panel up to date - atorvastatin (LIPITOR) 40 MG tablet; TAKE ONE TABLET BY MOUTH ONCE DAILY.  Dispense: 90 tablet; Refill: 1 - CBC; Future - Comprehensive metabolic panel; Future

## 2018-10-06 ENCOUNTER — Other Ambulatory Visit: Payer: Self-pay

## 2018-10-06 ENCOUNTER — Telehealth: Payer: Self-pay | Admitting: Endocrinology

## 2018-10-06 ENCOUNTER — Telehealth: Payer: Self-pay | Admitting: Nurse Practitioner

## 2018-10-06 ENCOUNTER — Ambulatory Visit (HOSPITAL_COMMUNITY)
Admission: RE | Admit: 2018-10-06 | Discharge: 2018-10-06 | Disposition: A | Discharge status: Home / Self Care | Payer: BLUE CROSS/BLUE SHIELD | Source: Ambulatory Visit | Attending: Vascular Surgery | Admitting: Vascular Surgery

## 2018-10-06 DIAGNOSIS — I4891 Unspecified atrial fibrillation: Secondary | ICD-10-CM

## 2018-10-06 DIAGNOSIS — I825Z9 Chronic embolism and thrombosis of unspecified deep veins of unspecified distal lower extremity: Secondary | ICD-10-CM

## 2018-10-06 MED ORDER — GLUCOSE BLOOD VI STRP: ORAL_STRIP | 3 refills | Status: DC

## 2018-10-06 NOTE — Telephone Encounter (Signed)
PCP informed of below and advised [facility] patient could go home. We will call him today with further advisement.

## 2018-10-06 NOTE — Telephone Encounter (Signed)
Patient states he talked to Olen Cordial a couple weeks about switching to [employer] from [employer]. States the test strips did not get sent to [facility]. Please Advise, thanks

## 2018-10-06 NOTE — Telephone Encounter (Signed)
Call report received from [facility] in [place]. Pt is still with them. Call transferred to [facility] at [facility] for report and further instructions for patient.   Per [facility] at [facility] patient is positive for left leg thrombus. Femoral vein

## 2018-10-06 NOTE — Telephone Encounter (Signed)
This is chronic, on xarelto, will send further plan with result note

## 2018-10-06 NOTE — Progress Notes (Signed)
Left LE Venous duplex performed. Results called in to [facility] office, spoke to [facility] at [place] office. Patient directed to go home and wait for call from provider's office.

## 2018-10-06 NOTE — Telephone Encounter (Signed)
Rx was sent to [facility] listed on pt's chart. Rx was now sent again.

## 2018-10-08 ENCOUNTER — Other Ambulatory Visit: Payer: Self-pay

## 2018-10-08 MED ORDER — GLUCOSE BLOOD VI STRP: ORAL_STRIP | 3 refills | Status: DC

## 2018-10-08 MED ORDER — ACCU-CHEK GUIDE ME W/DEVICE KIT: 1.0000 | PACK | Freq: Two times a day (BID) | 0 refills | Status: DC

## 2018-10-08 MED ORDER — ACCU-CHEK FASTCLIX LANCETS MISC: 1.0000 | Freq: Two times a day (BID) | 3 refills | Status: DC

## 2018-10-13 NOTE — Telephone Encounter (Signed)
Pt states that he missed a call from [facility] regarding a study he had completed. Please advise.

## 2018-10-17 ENCOUNTER — Other Ambulatory Visit: Payer: Self-pay | Admitting: Endocrinology

## 2018-10-29 ENCOUNTER — Other Ambulatory Visit (HOSPITAL_COMMUNITY): Payer: Self-pay | Admitting: *Deleted

## 2018-10-29 ENCOUNTER — Other Ambulatory Visit: Payer: Self-pay | Admitting: Nurse Practitioner

## 2018-10-29 ENCOUNTER — Telehealth: Payer: Self-pay | Admitting: Endocrinology

## 2018-10-29 ENCOUNTER — Other Ambulatory Visit: Payer: Self-pay

## 2018-10-29 DIAGNOSIS — I10 Essential (primary) hypertension: Secondary | ICD-10-CM

## 2018-10-29 DIAGNOSIS — I4891 Unspecified atrial fibrillation: Secondary | ICD-10-CM

## 2018-10-29 DIAGNOSIS — E785 Hyperlipidemia, unspecified: Secondary | ICD-10-CM

## 2018-10-29 DIAGNOSIS — I825Z9 Chronic embolism and thrombosis of unspecified deep veins of unspecified distal lower extremity: Secondary | ICD-10-CM

## 2018-10-29 MED ORDER — METOPROLOL TARTRATE 100 MG PO TABS: 100.0000 mg | ORAL_TABLET | Freq: Two times a day (BID) | ORAL | 6 refills | Status: DC

## 2018-10-29 MED ORDER — EMPAGLIFLOZIN-METFORMIN HCL 12.5-1000 MG PO TABS: 2.0000 | ORAL_TABLET | Freq: Every day | ORAL | 3 refills | Status: DC

## 2018-10-29 MED ORDER — LOSARTAN POTASSIUM 50 MG PO TABS: 50.0000 mg | ORAL_TABLET | Freq: Every day | ORAL | 1 refills | Status: DC

## 2018-10-29 MED ORDER — RIVAROXABAN 20 MG PO TABS: 20.0000 mg | ORAL_TABLET | Freq: Every day | ORAL | 1 refills | Status: DC

## 2018-10-29 MED ORDER — ICOSAPENT ETHYL 1 G PO CAPS: 2.0000 | ORAL_CAPSULE | Freq: Two times a day (BID) | ORAL | 0 refills | Status: DC

## 2018-10-29 MED ORDER — SEMAGLUTIDE (1 MG/DOSE) 2 MG/1.5ML ~~LOC~~ SOPN: 1.0000 mg | PEN_INJECTOR | SUBCUTANEOUS | 3 refills | Status: DC

## 2018-10-29 MED ORDER — GLUCOSE BLOOD VI STRP: ORAL_STRIP | 3 refills | Status: AC

## 2018-10-29 MED ORDER — AMLODIPINE BESYLATE 2.5 MG PO TABS: 2.5000 mg | ORAL_TABLET | Freq: Every day | ORAL | 1 refills | Status: DC

## 2018-10-29 MED ORDER — ACCU-CHEK FASTCLIX LANCETS MISC: 1.0000 | Freq: Two times a day (BID) | 3 refills | Status: DC

## 2018-10-29 MED ORDER — ATORVASTATIN CALCIUM 40 MG PO TABS: ORAL_TABLET | ORAL | 1 refills | Status: DC

## 2018-10-29 MED ORDER — GLIPIZIDE ER 2.5 MG PO TB24: 2.5000 mg | ORAL_TABLET | Freq: Every day | ORAL | 3 refills | Status: DC

## 2018-10-29 NOTE — Telephone Encounter (Signed)
Copied from [place] [phone] [phone]. Topic: Quick Communication - Rx Refill/Question >> Oct 29, 2018  2:43 PM Mcneil, Ja-Kwan wrote: Medication:  amLODipine (NORVASC) 2.5 MG tablet, atorvastatin (LIPITOR) 40 MG tablet, losartan (COZAAR) 50 MG tablet, and rivaroxaban (XARELTO) 20 MG TABS tablet  Has the patient contacted their pharmacy? yes   Preferred Pharmacy (with phone number or street name): [id number] - [place], [place] [phone] (Phone) [phone] (Fax)  Agent: Please be advised that RX refills may take up to 3 business days. We ask that you follow-up with your pharmacy.

## 2018-10-29 NOTE — Telephone Encounter (Signed)
[facility].

## 2018-10-29 NOTE — Telephone Encounter (Signed)
Patient states he needs to transfer all of his RX's from [facility] to [facility].   Please Advise, Thanks

## 2018-10-29 NOTE — Telephone Encounter (Signed)
Pt changing pharmacies Requested Prescriptions  Pending Prescriptions Disp Refills  . amLODipine (NORVASC) 2.5 MG tablet 90 tablet 1    Sig: Take 1 tablet (2.5 mg total) by mouth daily.     Cardiovascular:  Calcium Channel Blockers Passed - 10/29/2018  2:54 PM      Passed - Last BP in normal range    BP Readings from Last 1 Encounters:  09/24/18 104/70         Passed - Valid encounter within last 6 months    Recent Outpatient Visits          1 month ago Essential hypertension   [place], Delphia Grates, NP   5 months ago Elevated serum creatinine   [facility], Ranell Patrick, MD   6 months ago Congestive heart failure, unspecified HF chronicity, unspecified heart failure type [facility])   [facility], Ranell Patrick, MD   6 months ago Peripheral edema   [facility], Ranell Patrick, MD   1 year ago Gastroesophageal reflux disease, esophagitis presence not specified   [facility], Ranell Patrick, MD           . atorvastatin (LIPITOR) 40 MG tablet 90 tablet 1    Sig: TAKE ONE TABLET BY MOUTH ONCE DAILY.     Cardiovascular:  Antilipid - Statins Failed - 10/29/2018  2:54 PM      Failed - LDL in normal range and within 360 days    LDL Cholesterol  Date Value Ref Range Status  04/28/2018 104 (H) 0 - 99 mg/dL Final         Failed - HDL in normal range and within 360 days    HDL  Date Value Ref Range Status  04/28/2018 32.20 (L) >39.00 mg/dL Final         Failed - Triglycerides in normal range and within 360 days    Triglycerides  Date Value Ref Range Status  04/28/2018 166.0 (H) 0.0 - 149.0 mg/dL Final    Comment:    Normal:  <150 mg/dLBorderline High:  150 - 199 mg/dL         Passed - Total Cholesterol in normal range and within 360 days    Cholesterol  Date Value Ref Range Status  04/28/2018 169 0 - 200 mg/dL Final    Comment:    ATP III Classification       Desirable:  < 200 mg/dL                Borderline High:  200 - 239 mg/dL          High:  > = 240 mg/dL         Passed - Patient is not pregnant      Passed - Valid encounter within last 12 months    Recent Outpatient Visits          1 month ago Essential hypertension   [place], Delphia Grates, NP   5 months ago Elevated serum creatinine   [facility] [place], MD   6 months ago Congestive heart failure, unspecified HF chronicity, unspecified heart failure type [facility])   [facility], Ranell Patrick, MD   6 months ago Peripheral edema   [facility], Ranell Patrick, MD   1 year ago Gastroesophageal reflux disease, esophagitis presence not specified   [facility], Ranell Patrick, MD           . losartan (COZAAR) 50 MG tablet 90 tablet 1    Sig: Take 1 tablet (50 mg total) by mouth daily.     Cardiovascular:  Angiotensin Receptor Blockers Passed - 10/29/2018  2:54 PM      Passed - Cr in normal range and within 180 days    Creat  Date Value Ref Range Status  04/23/2016 1.26 (H) 0.70 - 1.25 mg/dL Final    Comment:      For patients > or = 66 years of age: The upper reference limit for Creatinine is approximately 13% higher for people identified as African-American.      Creatinine, Ser  Date Value Ref Range Status  09/24/2018 1.24 0.40 - 1.50 mg/dL Final         Passed - K in normal range and within 180 days    Potassium  Date Value Ref Range Status  09/24/2018 4.6 3.5 - 5.1 mEq/L Final         Passed - Patient is not pregnant      Passed - Last BP in normal range    BP Readings from Last 1 Encounters:  09/24/18 104/70         Passed - Valid encounter within last 6 months    Recent Outpatient Visits          1 month ago Essential hypertension   [place], Delphia Grates, NP   5 months ago Elevated serum creatinine   [facility], Ranell Patrick, MD   6  months ago Congestive heart failure, unspecified HF chronicity, unspecified heart failure type [facility])   [facility], Ranell Patrick, MD   6 months ago Peripheral edema   [facility], Ranell Patrick, MD   1 year ago Gastroesophageal reflux disease, esophagitis presence not specified   [facility], Ranell Patrick, MD           . rivaroxaban (XARELTO) 20 MG TABS tablet 90 tablet 1    Sig: Take 1 tablet (20 mg total) by mouth daily with supper.     Hematology: Anticoagulants - rivaroxaban Passed - 10/29/2018  2:54 PM      Passed - ALT in normal range and within 180 days    ALT  Date Value Ref Range Status  09/24/2018 15 0 - 53 U/L Final         Passed - AST in normal range and within 180 days    AST  Date Value Ref Range Status  09/24/2018 13 0 - 37 U/L Final         Passed - Cr in normal range and within 360 days    Creat  Date Value Ref Range Status  04/23/2016 1.26 (H) 0.70 - 1.25 mg/dL Final    Comment:      For patients > or = 66 years of age: The upper reference limit for Creatinine is approximately 13% higher for people identified as African-American.      Creatinine, Ser  Date Value Ref Range Status  09/24/2018 1.24 0.40 - 1.50 mg/dL Final         Passed - HCT in normal range and within 360 days    HCT  Date Value Ref Range Status  09/24/2018 46.1 39.0 - 52.0 % Final   Hematocrit  Date Value Ref Range  Status  04/16/2018 44.5 37.5 - 51.0 % Final         Passed - HGB in normal range and within 360 days    Hemoglobin  Date Value Ref Range Status  09/24/2018 15.1 13.0 - 17.0 g/dL Final  04/16/2018 14.7 13.0 - 17.7 g/dL Final         Passed - PLT in normal range and within 360 days    Platelets  Date Value Ref Range Status  09/24/2018 239.0 150.0 - 400.0 K/uL Final  04/16/2018 216 150 - 450 x10E3/uL Final   Platelet Count, POC  Date Value Ref Range Status  04/23/2016 211 142 - 424 K/uL Final         Passed -  Valid encounter within last 12 months    Recent Outpatient Visits          1 month ago Essential hypertension   [place], Delphia Grates, NP   5 months ago Elevated serum creatinine   [facility], Ranell Patrick, MD   6 months ago Congestive heart failure, unspecified HF chronicity, unspecified heart failure type [facility])   [facility], Ranell Patrick, MD   6 months ago Peripheral edema   [facility], Ranell Patrick, MD   1 year ago Gastroesophageal reflux disease, esophagitis presence not specified   [facility], Ranell Patrick, MD

## 2018-11-08 ENCOUNTER — Other Ambulatory Visit (INDEPENDENT_AMBULATORY_CARE_PROVIDER_SITE_OTHER): Payer: BLUE CROSS/BLUE SHIELD

## 2018-11-08 DIAGNOSIS — E1165 Type 2 diabetes mellitus with hyperglycemia: Secondary | ICD-10-CM

## 2018-11-08 LAB — BASIC METABOLIC PANEL
BUN: 24 mg/dL — ABNORMAL HIGH (ref 6–23)
CO2: 29 mEq/L (ref 19–32)
Calcium: 9.8 mg/dL (ref 8.4–10.5)
Chloride: 103 mEq/L (ref 96–112)
Creatinine, Ser: 1.22 mg/dL (ref 0.40–1.50)
GFR: 59.41 mL/min — ABNORMAL LOW (ref >60.00)
Glucose, Bld: 141 mg/dL — ABNORMAL HIGH (ref 70–99)
Potassium: 4.8 mEq/L (ref 3.5–5.1)
Sodium: 141 mEq/L (ref 135–145)

## 2018-11-08 LAB — HEMOGLOBIN A1C: Hgb A1c MFr Bld: 7.4 % — ABNORMAL HIGH (ref 4.6–6.5)

## 2018-11-10 ENCOUNTER — Other Ambulatory Visit: Payer: Self-pay

## 2018-11-10 ENCOUNTER — Encounter: Payer: Self-pay | Admitting: Endocrinology

## 2018-11-10 ENCOUNTER — Ambulatory Visit: Payer: BLUE CROSS/BLUE SHIELD | Admitting: Endocrinology

## 2018-11-10 VITALS — BP 108/60 | HR 76 | Ht 71.0 in | Wt 309.6 lb

## 2018-11-10 DIAGNOSIS — E1165 Type 2 diabetes mellitus with hyperglycemia: Secondary | ICD-10-CM | POA: Diagnosis not present

## 2018-11-10 MED ORDER — GLIMEPIRIDE 1 MG PO TABS: 1.0000 mg | ORAL_TABLET | Freq: Every day | ORAL | 1 refills | Status: DC

## 2018-11-10 MED ORDER — EMPAGLIFLOZIN-METFORMIN HCL 12.5-1000 MG PO TABS: 2.0000 | ORAL_TABLET | Freq: Every day | ORAL | 1 refills | Status: DC

## 2018-11-10 NOTE — Progress Notes (Signed)
Patient ID: Tommy Yang, male   DOB: [birth year], 68 y.o.   MRN: [id number]           Reason for Appointment: Follow-up for Type 2 Diabetes   History of Present Illness:          Diagnosis: Type 2 diabetes mellitus, date of diagnosis: 05/2013       Past history:   He apparently had significant hyperglycemia in 2014 when seen in the urgent care center but did not establish with a PCP for control. He was started on treatment for his diabetes in 10/2013 and he was initially treated with metformin and Januvia A few months later he was also given glipizide ER to help his control when his A1c had gone up to 10.9 He says he was also seen by dietitian but no record available of this. Subsequently his blood sugar control had improved with A1c down to 6.8 in 9/15 On his consultation in 3/16 he had high blood sugars and weight gain; A1c was 9.1% with taking glipizide ER, Januvia and metformin.  Recent history:    Non-insulin hypoglycemic drugs the patient is taking are: Glipizide ER 2.5 mg in am, Invokamet 150/1000, 2 tablets daily, Ozempic 0.5 mg weekly  His A1c has been as low as 6.1 previously. Although it is now slightly improved at 7.4 but was higher at 7.7  Current blood sugar patterns and problems identified:  He may have had higher sugars previously because of getting some steroids for his prostate treatment  Also his Ozempic was increased up to 1 mg on the last visit  With this he is having constipation and some nonspecific GI symptoms but he thinks this is tolerable  He is now using a company sponsored glucose meter and difficult to analyze his blood sugars  He is not able to do much exercise because of his cardiac issues  Also he thinks that his weight had improved but not consistently losing weight  Recently FASTING blood sugars are higher as seen on his meter and lab glucose was 141 fasting  Blood sugars are relatively lower around lunchtime again has had courses of  steroids for his prostate issues and he thinks blood sugars will go up to 200 with this  He did not bring his monitor for download and not clear how often he is having high sugars  Although he thinks his blood sugars are below 130 in the morning his lab glucose was 145 fasting  Previously had postprandial hyperglycemia but he thinks his readings after meals are mostly around 130-140  On his last visit his creatinine had gone up to 1.6 but now is improved and he is continuing full doses of Invokana  No side effects with Ozempic  Side effects from medications have been: None  Compliance with the medical regimen: Fair  Hypoglycemia: None    Glucose monitoring:  done 1-2 times a day         Glucometer:  [place] Blood Glucose readings by meter review  Average blood sugar before meals 148 and after meals 162 for the last 30 days  PRE-MEAL Fasting Lunch Dinner Bedtime Overall  Glucose range:  124-165  122+     Mean/median:      158   POST-MEAL PC Breakfast PC Lunch PC Dinner  Glucose range:     161-210  Mean/median:          Self-care: The diet that the patient has been following is: tries to limit  fats .  Meals: 3 meals per day. Breakfast is frequently cereal,sausage  Fruits for snacks  Exercise:  Very little, limited by poor endurance  Dietician visit, most recent:?  2015 .               Weight history:    Wt Readings from Last 3 Encounters:  11/10/18 (!) 309 lb 9.6 oz (140.4 kg)  09/24/18 (!) 313 lb (142 kg)  08/10/18 (!) 312 lb 12.8 oz (141.9 kg)    Glycemic control:   Lab Results  Component Value Date   HGBA1C 7.4 (H) 11/08/2018   HGBA1C 7.7 (H) 08/04/2018   HGBA1C 7.5 (H) 04/28/2018   Lab Results  Component Value Date   MICROALBUR 2.6 (H) 04/28/2018   LDLCALC 104 (H) 04/28/2018   CREATININE 1.22 11/08/2018    Lab on 11/08/2018  Component Date Value Ref Range Status  . Sodium 11/08/2018 141  135 - 145 mEq/L Final  . Potassium 11/08/2018 4.8  3.5 -  5.1 mEq/L Final  . Chloride 11/08/2018 103  96 - 112 mEq/L Final  . CO2 11/08/2018 29  19 - 32 mEq/L Final  . Glucose, Bld 11/08/2018 141* 70 - 99 mg/dL Final  . BUN 11/08/2018 24* 6 - 23 mg/dL Final  . Creatinine, Ser 11/08/2018 1.22  0.40 - 1.50 mg/dL Final  . Calcium 11/08/2018 9.8  8.4 - 10.5 mg/dL Final  . GFR 11/08/2018 59.41* >60.00 mL/min Final  . Hgb A1c MFr Bld 11/08/2018 7.4* 4.6 - 6.5 % Final   Glycemic Control Guidelines for People with Diabetes:Non Diabetic:  <6%Goal of Therapy: <7%Additional Action Suggested:  >8%       Allergies as of 11/10/2018      Reactions   Lupron [leuprolide] Swelling, Other (See Comments)   Put in A-fib      Medication List       Accurate as of November 10, 2018  8:18 AM. Always use your most recent med list.        amLODipine 2.5 MG tablet Commonly known as:  NORVASC Take 1 tablet (2.5 mg total) by mouth daily.   atorvastatin 40 MG tablet Commonly known as:  LIPITOR TAKE ONE TABLET BY MOUTH ONCE DAILY.   blood glucose meter kit and supplies Kit Test blood sugar daily as directed. Dx code: E11.9   calcium-vitamin D 250-125 MG-UNIT tablet Commonly known as:  OSCAL WITH D Take 1 tablet by mouth daily.   CINNAMON PO Take 1,000 mg by mouth daily.   Coral Calcium 1000 (390 Ca) MG Tabs Take 1,000 mg by mouth daily.   fluticasone 50 MCG/ACT nasal spray Commonly known as:  FLONASE Place 2 sprays into both nostrils daily as needed for allergies.   glipiZIDE 2.5 MG 24 hr tablet Commonly known as:  GLUCOTROL XL Take 1 tablet (2.5 mg total) by mouth daily with breakfast.   glucose blood test strip Commonly known as:  Accu-Chek Guide Use as instructed to check blood sugar twice daily.   Icosapent Ethyl 1 g Caps Commonly known as:  Vascepa Take 2 capsules (2 g total) by mouth 2 (two) times daily.   leuprolide 11.25 MG injection Commonly known as:  LUPRON Inject 11.25 mg into the muscle every 3 (three) months.   losartan 50 MG  tablet Commonly known as:  COZAAR Take 1 tablet (50 mg total) by mouth daily.   metoprolol tartrate 100 MG tablet Commonly known as:  LOPRESSOR Take 1 tablet (100 mg total) by mouth 2 (two) times  daily.   rivaroxaban 20 MG Tabs tablet Commonly known as:  Xarelto Take 1 tablet (20 mg total) by mouth daily with supper.   Semaglutide (1 MG/DOSE) 2 MG/1.5ML Sopn Commonly known as:  Ozempic (1 MG/DOSE) Inject 1 mg into the skin once a week. Inject 73m under the skin once weekly.   Turmeric 450 MG Caps Take by mouth.   Vitamin D (Cholecalciferol) 10 MCG (400 UNIT) Tabs Take 400 Units by mouth daily.       Allergies:  Allergies  Allergen Reactions  . Lupron [Leuprolide] Swelling and Other (See Comments)    Put in A-fib    Past Medical History:  Diagnosis Date  . Atrial fibrillation ([facility]   . Diabetes mellitus without complication ([facility]   . DVT (deep venous thrombosis) ([facility])    left leg.  .Marland KitchenGERD (gastroesophageal reflux disease)   . Hypercholesterolemia   . Hypertension   . Prostate cancer ([facility] 12/06/13   gleason 4+3=7, 6/12 cores positive. seed implant and radiation    Past Surgical History:  Procedure Laterality Date  . APPENDECTOMY    . BALLOON DILATION N/A 08/02/2015   Procedure: BALLOON DILATION;  Surgeon: DMilus Banister MD;  [facility];  Service: Endoscopy;  Laterality: N/A;  . CARDIOVERSION N/A 05/13/2018   Procedure: CARDIOVERSION;  Surgeon: CSanda Klein MD;  [facility];  Service: Cardiovascular;  Laterality: N/A;  . CARDIOVERSION N/A 05/18/2018   Procedure: CARDIOVERSION;  Surgeon: CBuford Dresser MD;  [place] [facility]  Service: Cardiovascular;  Laterality: N/A;  . COLONOSCOPY WITH PROPOFOL N/A 08/02/2015   Procedure: COLONOSCOPY WITH PROPOFOL w/ APC;  Surgeon: DMilus Banister MD;  [facility];  Service: Endoscopy;  Laterality: N/A;  . ESOPHAGOGASTRODUODENOSCOPY (EGD) WITH PROPOFOL N/A 08/02/2015   Procedure:  ESOPHAGOGASTRODUODENOSCOPY (EGD) WITH PROPOFOL/ possible dilation.;  Surgeon: DMilus Banister MD;  [facility];  Service: Endoscopy;  Laterality: N/A;  . KNEE SURGERY     right knee orthoscopic  . PROSTATE BIOPSY  12/06/13   Gleason 4+3=7, volume 35 gm  . [place] T[place]      Family History  Problem Relation Age of Onset  . Heart disease Mother        before age 66 . Diabetes Mother   . Hyperlipidemia Mother   . Varicose Veins Mother   . Hypertension Mother   . Heart disease Father   . Diabetes Father   . Hyperlipidemia Father   . Hypertension Father   . Cancer Brother   . COPD Brother   . Diabetes Brother   . Hyperlipidemia Brother   . Hypertension Brother     Social History:  reports that he quit smoking about 6 years ago. His smoking use included cigarettes. He has a 11.50 pack-year smoking history. He has never used smokeless tobacco. He reports current alcohol use. He reports that he does not use drugs.    Review of Systems        Lipids: he has had significant hyperlipidemia and has been treated with Lipitor 40 mg daily and is also taking Vascepa for triglycerides Has no history of CAD His diet is relatively low in fat  Continues to have low HDL Triglycerides are slightly high and also last LDL is above 100  Followed by PCP       Lab Results  Component Value Date   CHOL 169 04/28/2018   CHOL 153 10/29/2017  CHOL 167 08/03/2017   Lab Results  Component Value Date   HDL 32.20 (L) 04/28/2018   HDL 30.40 (L) 10/29/2017   HDL 32.00 (L) 08/03/2017   Lab Results  Component Value Date   LDLCALC 104 (H) 04/28/2018   LDLCALC 92 10/29/2017   LDLCALC 102 (H) 08/03/2017   Lab Results  Component Value Date   TRIG 166.0 (H) 04/28/2018   TRIG 152.0 (H) 10/29/2017   TRIG 164.0 (H) 08/03/2017   Lab Results  Component Value Date   CHOLHDL 5 04/28/2018   CHOLHDL 5 10/29/2017   CHOLHDL 5  08/03/2017   Lab Results  Component Value Date   LDLDIRECT 93.0 12/30/2016   LDLDIRECT 103.0 02/08/2015                   The blood pressure has been treated with losartan 25 mg and low-dose metoprolol, followed by PCP and cardiologist Also recently not on Lasix for edema   BP Readings from Last 3 Encounters:  11/10/18 108/60  09/24/18 104/70  08/10/18 130/78    RENAL function is stable  Lab Results  Component Value Date   CREATININE 1.22 11/08/2018   CREATININE 1.24 09/24/2018   CREATININE 1.30 08/04/2018    Followed by cardiologist for atrial fibrillation history  LABS:  Lab on 11/08/2018  Component Date Value Ref Range Status  . Sodium 11/08/2018 141  135 - 145 mEq/L Final  . Potassium 11/08/2018 4.8  3.5 - 5.1 mEq/L Final  . Chloride 11/08/2018 103  96 - 112 mEq/L Final  . CO2 11/08/2018 29  19 - 32 mEq/L Final  . Glucose, Bld 11/08/2018 141* 70 - 99 mg/dL Final  . BUN 11/08/2018 24* 6 - 23 mg/dL Final  . Creatinine, Ser 11/08/2018 1.22  0.40 - 1.50 mg/dL Final  . Calcium 11/08/2018 9.8  8.4 - 10.5 mg/dL Final  . GFR 11/08/2018 59.41* >60.00 mL/min Final  . Hgb A1c MFr Bld 11/08/2018 7.4* 4.6 - 6.5 % Final   Glycemic Control Guidelines for People with Diabetes:Non Diabetic:  <6%Goal of Therapy: <7%Additional Action Suggested:  >8%     Physical Examination:  BP 108/60 (BP Location: Left Arm, Patient Position: Sitting, Cuff Size: Normal)   Pulse 76   Ht '5\' 11"'  (1.803 m)   Wt (!) 309 lb 9.6 oz (140.4 kg)   SpO2 97%   BMI 43.18 kg/m   Diabetes type 2, morbidly obese, non-insulin-dependent   See history of present illness for detailed discussion of his current management, blood sugar patterns and problems identified   His A1c is still relatively high at 7.4  A1c may be slightly better with increasing Ozempic However blood sugars are relatively higher after dinner and fasting He thinks he is trying to doing fairly well with the diet but not able to  exercise or lose weight much He has some GI side effects with Ozempic but he feels like he can tolerate this   HYPERTENSION: Mild and well-controlled   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       Elayne Snare 11/10/2018, 8:18  AM   Note: This office note was prepared with Estate agent. Any transcriptional errors that result from this process are unintentional.

## 2018-11-10 NOTE — Patient Instructions (Signed)
Change to Glimepide at supper

## 2018-11-22 ENCOUNTER — Encounter (HOSPITAL_COMMUNITY): Payer: Self-pay | Admitting: *Deleted

## 2018-11-22 ENCOUNTER — Other Ambulatory Visit (HOSPITAL_COMMUNITY): Payer: Self-pay | Admitting: Nurse Practitioner

## 2018-11-22 DIAGNOSIS — I10 Essential (primary) hypertension: Secondary | ICD-10-CM

## 2018-11-24 ENCOUNTER — Ambulatory Visit (HOSPITAL_COMMUNITY)
Admission: RE | Admit: 2018-11-24 | Discharge: 2018-11-24 | Disposition: A | Discharge status: Home / Self Care | Payer: BLUE CROSS/BLUE SHIELD | Source: Ambulatory Visit | Attending: Nurse Practitioner | Admitting: Nurse Practitioner

## 2018-11-24 ENCOUNTER — Other Ambulatory Visit: Payer: Self-pay

## 2018-11-24 DIAGNOSIS — I4891 Unspecified atrial fibrillation: Secondary | ICD-10-CM

## 2018-11-24 NOTE — Progress Notes (Signed)
Electrophysiology TeleHealth Note   Due to national recommendations of social distancing due to [place] 19, Audio  telehealth visit is felt to be most appropriate for this patient at this time.  See MyChart message/consent below  from today for patient consent regarding telehealth for the [facility].    Date:  11/24/2018   ID:  [name], DOB [birth year], MRN [id number]  Location: home   Provider location: [address] [place], [place] [address] Evaluation Performed:Follow up   PCP:  Lance Sell, NP  Primary Cardiologist:  None  Primary Electrophysiologist: none   BU:LAGTXM fibrillation   History of Present Illness: Tommy Yang is a 68 y.o. male who presents via audio/video conferencing for a telehealth visit today.   The patient is being evaluated for persistent afib.   In the past hospitalization for Tikosyn as been discussed. Unfortunately, with Covid-19, all elective hospitalizations are on hold. However, pt sounds minimally symptomatic. Reports only shortness of breath with inclines. He is rate contolled.no issues with xarelto/bleeding.  Today, he denies symptoms of palpitations, chest pain, shortness of breath, orthopnea, PND, lower extremity edema, claudication, dizziness, presyncope, syncope, bleeding, or neurologic sequela. The patient is tolerating medications without difficulties and is otherwise without complaint today.   he denies symptoms of cough, fevers, chills, or new SOB worrisome for COVID 19.    Atrial Fibrillation Risk Factors:  he does not have symptoms or diagnosis of sleep apnea. he does not have a history of rheumatic fever. he does not have a history of alcohol use.  There were no vitals filed for this visit.  Past Medical History:  Diagnosis Date  . Atrial fibrillation ([facility])   . Diabetes mellitus without complication ([facility])   . DVT (deep venous thrombosis) ([facility])    left leg.  [name] GERD (gastroesophageal reflux  disease)   . Hypercholesterolemia   . Hypertension   . Prostate cancer ([facility]) 12/06/13   gleason 4+3=7, 6/12 cores positive. seed implant and radiation   Past Surgical History:  Procedure Laterality Date  . APPENDECTOMY    . BALLOON DILATION N/A 08/02/2015   Procedure: BALLOON DILATION;  Surgeon: [name], MD;  Location: [facility];  Service: Endoscopy;  Laterality: N/A;  . CARDIOVERSION N/A 05/13/2018   Procedure: CARDIOVERSION;  Surgeon: Sanda Klein, MD;  Location: [facility];  Service: Cardiovascular;  Laterality: N/A;  . CARDIOVERSION N/A 05/18/2018   Procedure: CARDIOVERSION;  Surgeon: Buford Dresser, MD;  Location: [facility];  Service: Cardiovascular;  Laterality: N/A;  . COLONOSCOPY WITH PROPOFOL N/A 08/02/2015   Procedure: COLONOSCOPY WITH PROPOFOL w/ APC;  Surgeon: [name], MD;  Location: [facility];  Service: Endoscopy;  Laterality: N/A;  . ESOPHAGOGASTRODUODENOSCOPY (EGD) WITH PROPOFOL N/A 08/02/2015   Procedure: ESOPHAGOGASTRODUODENOSCOPY (EGD) WITH PROPOFOL/ possible dilation.;  Surgeon: [name], MD;  Location: [facility];  Service: Endoscopy;  Laterality: N/A;  . KNEE SURGERY     right knee orthoscopic  . PROSTATE BIOPSY  12/06/13   Gleason 4+3=7, volume 35 gm  . [place]  . TONSILLECTOMY       Current Outpatient Medications  Medication Sig Dispense Refill  . amLODipine (NORVASC) 2.5 MG tablet Take 1 tablet (2.5 mg total) by mouth daily. 90 tablet 1  . atorvastatin (LIPITOR) 40 MG tablet TAKE ONE TABLET BY MOUTH ONCE DAILY. 90 tablet 1  . blood glucose meter kit and supplies KIT Test blood sugar  daily as directed. Dx code: E11.9 1 each 0  . calcium-vitamin D (OSCAL WITH D) 250-125 MG-UNIT tablet Take 1 tablet by mouth daily.    [name] CINNAMON PO Take 1,000 mg by mouth daily.    [name] Coral Calcium 1000 (390 Ca) MG TABS Take 1,000 mg by mouth daily.    . Empagliflozin-metFORMIN HCl (SYNJARDY)  12.12-998 MG TABS Take 2 tablets by mouth daily. 180 tablet 1  . fluticasone (FLONASE) 50 MCG/ACT nasal spray Place 2 sprays into both nostrils daily as needed for allergies.  99  . glimepiride (AMARYL) 1 MG tablet Take 1 tablet (1 mg total) by mouth daily before supper. 90 tablet 1  . glipiZIDE (GLUCOTROL XL) 2.5 MG 24 hr tablet Take 1 tablet (2.5 mg total) by mouth daily with breakfast. 30 tablet 3  . glucose blood (ACCU-CHEK GUIDE) test strip Use as instructed to check blood sugar twice daily. 100 each 3  . Icosapent Ethyl (VASCEPA) 1 g CAPS Take 2 capsules (2 g total) by mouth 2 (two) times daily. 120 capsule 0  . leuprolide (LUPRON) 11.25 MG injection Inject 11.25 mg into the muscle every 3 (three) months.    [name] losartan (COZAAR) 50 MG tablet Take 1 tablet (50 mg total) by mouth daily. 90 tablet 1  . metoprolol tartrate (LOPRESSOR) 100 MG tablet TAKE 1 TABLET BY MOUTH TWICE A DAY 60 tablet 1  . rivaroxaban (XARELTO) 20 MG TABS tablet Take 1 tablet (20 mg total) by mouth daily with supper. 90 tablet 1  . Semaglutide, 1 MG/DOSE, (OZEMPIC, 1 MG/DOSE,) 2 MG/1.5ML SOPN Inject 1 mg into the skin once a week. Inject '1mg'$  under the skin once weekly. 2 pen 3  . Turmeric 450 MG CAPS Take by mouth.    . Vitamin D, Cholecalciferol, 400 UNITS TABS Take 400 Units by mouth daily.     No current facility-administered medications for this encounter.     Allergies:   Lupron [leuprolide]   Social History:  The patient  reports that he quit smoking about 6 years ago. His smoking use included cigarettes. He has a 11.50 pack-year smoking history. He has never used smokeless tobacco. He reports current alcohol use. He reports that he does not use drugs.   Family History:  The patient's  family history includes COPD in his brother; Cancer in his brother; Diabetes in his brother, father, and mother; Heart disease in his father and mother; Hyperlipidemia in his brother, father, and mother; Hypertension in his brother,  father, and mother; Varicose Veins in his mother.    ROS:  Please see the history of present illness.   All other systems are personally reviewed and negative.   Exam: NA, phone visit   Recent Labs: 04/16/2018: NT-Pro BNP 1,322 09/24/2018: ALT 15; Hemoglobin 15.1; Platelets 239.0 11/08/2018: BUN 24; Creatinine, Ser 1.22; Potassium 4.8; Sodium 141  personally reviewed    Other studies personally reviewed: Epic records reviewed     ASSESSMENT AND PLAN:  1.  Persistent  atrial fibrillation V/s per pt this am BP 100/65 and HR 72 and irregular He is not symptomatic with  hypotension, this is his usual range He is well rate controlled  Continue current rate control without change  Tikosyn is on hold, still unsure if it would restore/hold pt in SR with his other comorbidities  2. This patients CHA2DS2-VASc Score and unadjusted Ischemic Stroke Rate (% per year) is equal to 7.2 % stroke rate/year from a score of 5  Above  score calculated as 1 point each if present [CHF, HTN, DM, Vascular=MI/PAD/Aortic Plaque, Age if 65-74, or Male] Above score calculated as 2 points each if present [Age > 75, or Stroke/TIA/TE]  Continue xarelto, no bleeding issues   3. COVID screen The patient does not have any symptoms that suggest any further testing/ screening at this time.  Social distancing reinforced today.    Follow-up:  3 months  Current medicines are reviewed at length with the patient today.   The patient does not have concerns regarding his medicines.  The following changes were made today:  none  Labs/ tests ordered today include:none No orders of the defined types were placed in this encounter.   Patient Risk:  after full review of this patients clinical status, I feel that they are at moderate risk at this time.   Today, I have spent 10  minutes with the patient with telehealth technology discussing afib/symptoms.  Signed, Roderic Palau, NP 11/24/2018 9:08 AM  [facility] [address] [place],  [address] [phone]    hereby voluntarily request, consent and authorize the [facility] and its employed or contracted physicians, physician assistants, nurse practitioners or other licensed health care professionals (the Practitioner), to provide me with telemedicine health care services (the "Services") as deemed necessary by the treating Practitioner. I acknowledge and consent to receive the Services by the Practitioner via telemedicine. I understand that the telemedicine visit will involve communicating with the Practitioner through live audiovisual communication technology and the disclosure of certain medical information by electronic transmission. I acknowledge that I have been given the opportunity to request an in-person assessment or other available alternative prior to the telemedicine visit and am voluntarily participating in the telemedicine visit.   I understand that I have the right to withhold or withdraw my consent to the use of telemedicine in the course of my care at any time, without affecting my right to future care or treatment, and that the Practitioner or I may terminate the telemedicine visit at any time. I understand that I have the right to inspect all information obtained and/or recorded in the course of the telemedicine visit and may receive copies of available information for a reasonable fee.  I understand that some of the potential risks of receiving the Services via telemedicine include:   Delay or interruption in medical evaluation due to technological equipment failure or disruption;  Information transmitted may not be sufficient (e.g. poor resolution of images) to allow for appropriate medical decision making by the Practitioner; and/or  In rare instances, security protocols could fail, causing a breach of personal health information.   Furthermore, I acknowledge that it is my responsibility to  provide information about my medical history, conditions and care that is complete and accurate to the best of my ability. I acknowledge that Practitioner's advice, recommendations, and/or decision may be based on factors not within their control, such as incomplete or inaccurate data provided by me or distortions of diagnostic images or specimens that may result from electronic transmissions. I understand that the practice of medicine is not an exact science and that Practitioner makes no warranties or guarantees regarding treatment outcomes. I acknowledge that I will receive a copy of this consent concurrently upon execution via email to the email address I last provided but may also request a printed copy by calling the office of the [facility].  I understand that my insurance will be billed for this visit.  I have read or had this consent read to me.  I understand the contents of this consent, which adequately explains the benefits and risks of the Services being provided via telemedicine.  I have been provided ample opportunity to ask questions regarding this consent and the Services and have had my questions answered to my satisfaction.  I give my informed consent for the services to be provided through the use of telemedicine in my medical care  By participating in this telemedicine visit I agree to the above.

## 2018-12-02 NOTE — Addendum Note (Signed)
Encounter addended by: Sherran Needs, NP on: 12/02/2018 12:49 PM  Actions taken: Charge Capture section accepted

## 2018-12-08 ENCOUNTER — Telehealth: Payer: Self-pay | Admitting: *Deleted

## 2018-12-08 NOTE — Telephone Encounter (Signed)
Pt informed of below. He states he already has appt scheduled with urologist.

## 2018-12-08 NOTE — Telephone Encounter (Signed)
Copied from [place] [id number]. Topic: Referral - Request for Referral >> Dec 07, 2018  3:33 PM Nils Flack, [name] wrote: Has patient seen PCP for this complaint?  *If NO, is insurance requiring patient see PCP for this issue before PCP can refer them? Referral for which specialty: urology Preferred provider/office: [facility]  Reason for referral: needs lupron shot  Does not want to see Dr Billy Fischer at [facility] - wants to go to [facility] and see someone else  Cb is [phone] Is due now for his 3 month shot

## 2018-12-08 NOTE — Telephone Encounter (Signed)
I do not think he needs a referral - he is already established with that office - he should call there and see if he can switch providers - this is the quickest way to go about this.

## 2018-12-13 ENCOUNTER — Other Ambulatory Visit: Payer: Self-pay | Admitting: Endocrinology

## 2018-12-13 DIAGNOSIS — C61 Malignant neoplasm of prostate: Secondary | ICD-10-CM | POA: Diagnosis not present

## 2018-12-16 DIAGNOSIS — N200 Calculus of kidney: Secondary | ICD-10-CM | POA: Diagnosis not present

## 2018-12-16 DIAGNOSIS — C778 Secondary and unspecified malignant neoplasm of lymph nodes of multiple regions: Secondary | ICD-10-CM | POA: Diagnosis not present

## 2018-12-16 DIAGNOSIS — C61 Malignant neoplasm of prostate: Secondary | ICD-10-CM | POA: Diagnosis not present

## 2019-01-06 ENCOUNTER — Other Ambulatory Visit: Payer: Self-pay | Admitting: Endocrinology

## 2019-01-21 ENCOUNTER — Other Ambulatory Visit (HOSPITAL_COMMUNITY): Payer: Self-pay | Admitting: Nurse Practitioner

## 2019-01-21 DIAGNOSIS — I10 Essential (primary) hypertension: Secondary | ICD-10-CM

## 2019-01-23 ENCOUNTER — Other Ambulatory Visit: Payer: Self-pay | Admitting: Endocrinology

## 2019-02-03 ENCOUNTER — Other Ambulatory Visit: Payer: Self-pay

## 2019-02-03 ENCOUNTER — Other Ambulatory Visit: Payer: Self-pay | Admitting: Endocrinology

## 2019-02-03 ENCOUNTER — Other Ambulatory Visit (INDEPENDENT_AMBULATORY_CARE_PROVIDER_SITE_OTHER): Payer: BLUE CROSS/BLUE SHIELD

## 2019-02-03 DIAGNOSIS — E1165 Type 2 diabetes mellitus with hyperglycemia: Secondary | ICD-10-CM | POA: Diagnosis not present

## 2019-02-03 LAB — COMPREHENSIVE METABOLIC PANEL
ALT: 12 U/L (ref 0–53)
AST: 13 U/L (ref 0–37)
Albumin: 4.1 g/dL (ref 3.5–5.2)
Alkaline Phosphatase: 64 U/L (ref 39–117)
BUN: 24 mg/dL — ABNORMAL HIGH (ref 6–23)
CO2: 25 mEq/L (ref 19–32)
Calcium: 9.7 mg/dL (ref 8.4–10.5)
Chloride: 105 mEq/L (ref 96–112)
Creatinine, Ser: 1.27 mg/dL (ref 0.40–1.50)
GFR: 56.68 mL/min — ABNORMAL LOW (ref >60.00)
Glucose, Bld: 111 mg/dL — ABNORMAL HIGH (ref 70–99)
Potassium: 5.2 mEq/L — ABNORMAL HIGH (ref 3.5–5.1)
Sodium: 141 mEq/L (ref 135–145)
Total Bilirubin: 0.6 mg/dL (ref 0.2–1.2)
Total Protein: 7.2 g/dL (ref 6.0–8.3)

## 2019-02-03 LAB — HEMOGLOBIN A1C: Hgb A1c MFr Bld: 7.4 % — ABNORMAL HIGH (ref 4.6–6.5)

## 2019-02-03 LAB — MICROALBUMIN / CREATININE URINE RATIO
Creatinine,U: 74.4 mg/dL
Microalb Creat Ratio: 5.7 mg/g (ref 0.0–30.0)
Microalb, Ur: 4.2 mg/dL — ABNORMAL HIGH (ref 0.0–1.9)

## 2019-02-03 LAB — LIPID PANEL
Cholesterol: 144 mg/dL (ref 0–200)
HDL: 34.1 mg/dL — ABNORMAL LOW (ref >39.00)
LDL Cholesterol: 84 mg/dL (ref 0–99)
NonHDL: 109.98
Total CHOL/HDL Ratio: 4
Triglycerides: 128 mg/dL (ref 0.0–149.0)
VLDL: 25.6 mg/dL (ref 0.0–40.0)

## 2019-02-07 ENCOUNTER — Ambulatory Visit (INDEPENDENT_AMBULATORY_CARE_PROVIDER_SITE_OTHER): Payer: BLUE CROSS/BLUE SHIELD | Admitting: Endocrinology

## 2019-02-07 ENCOUNTER — Other Ambulatory Visit: Payer: Self-pay

## 2019-02-07 ENCOUNTER — Encounter: Payer: Self-pay | Admitting: Endocrinology

## 2019-02-07 DIAGNOSIS — E782 Mixed hyperlipidemia: Secondary | ICD-10-CM | POA: Diagnosis not present

## 2019-02-07 DIAGNOSIS — E1165 Type 2 diabetes mellitus with hyperglycemia: Secondary | ICD-10-CM | POA: Diagnosis not present

## 2019-02-07 NOTE — Progress Notes (Signed)
Patient ID: Tommy Yang, male   DOB: 01-16-1951, 68 y.o.   MRN: [id number]           Reason for Appointment: Follow-up for Type 2 Diabetes  Today's office visit was provided via telemedicine using a telephone call to the patient Patient has been explained the limitations of evaluation and management by telemedicine and the availability of in person appointments.  The patient understood the limitations and agreed to proceed. Patient also understood that the telehealth visit is billable.  Location of the patient: Home  Location of the provider: Office Only the patient and myself were participating in the encounter  History of Present Illness:          Diagnosis: Type 2 diabetes mellitus, date of diagnosis: 05/2013       Past history:   He apparently had significant hyperglycemia in 2014 when seen in the urgent care center but did not establish with a PCP for control. He was started on treatment for his diabetes in 10/2013 and he was initially treated with metformin and Januvia A few months later he was also given glipizide ER to help his control when his A1c had gone up to 10.9 He says he was also seen by dietitian but no record available of this. Subsequently his blood sugar control had improved with A1c down to 6.8 in 9/15 On his consultation in 3/16 he had high blood sugars and weight gain; A1c was 9.1% with taking glipizide ER, Januvia and metformin.  Recent history:    Non-insulin hypoglycemic drugs the patient is taking are: Amaryl 1 mg at bedtime Synjardy 12.12/998, 2 tablets daily, Ozempic 1 mg weekly  His A1c has been as low as 6.1 previously and is still 7.4.   Current blood sugar patterns and problems identified:  He recently has better blood sugars overall compared to his last visit  He is fairly consistent with checking sugars either fasting or couple of hours after 1 of his meals including dinner  However not clear why his A1c has gone up since his blood  sugars even after meals are averaging only 143  He does take his Amaryl at 7-8 PM but not before dinner although his blood sugars after dinner do not appear to be high consistently  He may have some fullness and bloating with Ozempic occasionally but no nausea or vomiting  Tolerating 1 mg Ozempic well otherwise  Since his last visit the insurance changed his prescription from [employer] to [facility]  His weight apparently is down about 8 pounds  He is trying to do some walking and more activity with lawnmowing  Side effects from medications have been: None  Compliance with the medical regimen: Fair  Hypoglycemia: None    Glucose monitoring:  done 1-2 times a day         Glucometer:  [place] Blood Glucose readings by meter review  PRE-meal average 121 Postprandial average 143  PRE-MEAL Fasting Lunch Dinner Bedtime Overall  Glucose range:  105-196     100-207  Mean/median:      134   POST-MEAL PC Breakfast PC Lunch PC Dinner  Glucose range:    106-01 87  Mean/median:       PREVIOUS readings:  PRE-MEAL Fasting Lunch Dinner Bedtime Overall  Glucose range:  124-165  122+     Mean/median:      158   POST-MEAL PC Breakfast PC Lunch PC Dinner  Glucose range:     161-210  Mean/median:  Self-care: The diet that the patient has been following is: tries to limit portions and fats .      Meals: 3 meals per day. Breakfast is frequently cereal,sausage  Fruits for snacks  Exercise:  a little more activity recently walking and yardwork  Dietician visit, most recent:?  2015 .               Weight history:    Wt Readings from Last 3 Encounters:  11/10/18 (!) 309 lb 9.6 oz (140.4 kg)  09/24/18 (!) 313 lb (142 kg)  08/10/18 (!) 312 lb 12.8 oz (141.9 kg)    Glycemic control:   Lab Results  Component Value Date   HGBA1C 7.4 (H) 02/03/2019   HGBA1C 7.4 (H) 11/08/2018   HGBA1C 7.7 (H) 08/04/2018   Lab Results  Component Value Date   MICROALBUR 4.2 (H) 02/03/2019     LDLCALC 84 02/03/2019   CREATININE 1.27 02/03/2019    Lab on 02/03/2019  Component Date Value Ref Range Status   Microalb, Ur 02/03/2019 4.2* 0.0 - 1.9 mg/dL Final   Creatinine,U 02/03/2019 74.4  mg/dL Final   Microalb Creat Ratio 02/03/2019 5.7  0.0 - 30.0 mg/g Final   Cholesterol 02/03/2019 144  0 - 200 mg/dL Final   ATP III Classification       Desirable:  < 200 mg/dL               Borderline High:  200 - 239 mg/dL          High:  > = 240 mg/dL   Triglycerides 02/03/2019 128.0  0.0 - 149.0 mg/dL Final   Normal:  <150 mg/dLBorderline High:  150 - 199 mg/dL   HDL 02/03/2019 34.10* >39.00 mg/dL Final   VLDL 02/03/2019 25.6  0.0 - 40.0 mg/dL Final   LDL Cholesterol 02/03/2019 84  0 - 99 mg/dL Final   Total CHOL/HDL Ratio 02/03/2019 4   Final                  Men          Women1/2 Average Risk     3.4          3.3Average Risk          5.0          4.42X Average Risk          9.6          7.13X Average Risk          15.0          11.0                       NonHDL 02/03/2019 109.98   Final   NOTE:  Non-HDL goal should be 30 mg/dL higher than patient's LDL goal (i.e. LDL goal of < 70 mg/dL, would have non-HDL goal of < 100 mg/dL)   Sodium 02/03/2019 141  135 - 145 mEq/L Final   Potassium 02/03/2019 5.2 Hemolysis seen..* 3.5 - 5.1 mEq/L Final   Chloride 02/03/2019 105  96 - 112 mEq/L Final   CO2 02/03/2019 25  19 - 32 mEq/L Final   Glucose, Bld 02/03/2019 111* 70 - 99 mg/dL Final   BUN 02/03/2019 24* 6 - 23 mg/dL Final   Creatinine, Ser 02/03/2019 1.27  0.40 - 1.50 mg/dL Final   Total Bilirubin 02/03/2019 0.6  0.2 - 1.2 mg/dL Final   Alkaline Phosphatase 02/03/2019 64  39 -  117 U/L Final   AST 02/03/2019 13  0 - 37 U/L Final   ALT 02/03/2019 12  0 - 53 U/L Final   Total Protein 02/03/2019 7.2  6.0 - 8.3 g/dL Final   Albumin 02/03/2019 4.1  3.5 - 5.2 g/dL Final   Calcium 02/03/2019 9.7  8.4 - 10.5 mg/dL Final   GFR 02/03/2019 56.68* >60.00 mL/min Final   Hgb  A1c MFr Bld 02/03/2019 7.4* 4.6 - 6.5 % Final   Glycemic Control Guidelines for People with Diabetes:Non Diabetic:  <6%Goal of Therapy: <7%Additional Action Suggested:  >8%       Allergies as of 02/07/2019      Reactions   Lupron [leuprolide] Swelling, Other (See Comments)   Put in A-fib      Medication List       Accurate as of February 07, 2019  8:04 AM. If you have any questions, ask your nurse or doctor.        amLODipine 2.5 MG tablet Commonly known as:  NORVASC Take 1 tablet (2.5 mg total) by mouth daily.   atorvastatin 40 MG tablet Commonly known as:  LIPITOR TAKE ONE TABLET BY MOUTH ONCE DAILY.   blood glucose meter kit and supplies Kit Test blood sugar daily as directed. Dx code: E11.9   calcium-vitamin D 250-125 MG-UNIT tablet Commonly known as:  OSCAL WITH D Take 1 tablet by mouth daily.   CINNAMON PO Take 1,000 mg by mouth daily.   Coral Calcium 1000 (390 Ca) MG Tabs Take 1,000 mg by mouth daily.   Empagliflozin-metFORMIN HCl 12.12-998 MG Tabs Commonly known as:  Synjardy Take 2 tablets by mouth daily.   fluticasone 50 MCG/ACT nasal spray Commonly known as:  FLONASE Place 2 sprays into both nostrils daily as needed for allergies.   glimepiride 1 MG tablet Commonly known as:  Amaryl Take 1 tablet (1 mg total) by mouth daily before supper.   glipiZIDE 2.5 MG 24 hr tablet Commonly known as:  GLUCOTROL XL Take 1 tablet (2.5 mg total) by mouth daily with breakfast.   glucose blood test strip Commonly known as:  Accu-Chek Guide Use as instructed to check blood sugar twice daily.   leuprolide 11.25 MG injection Commonly known as:  LUPRON Inject 11.25 mg into the muscle every 3 (three) months.   losartan 50 MG tablet Commonly known as:  COZAAR Take 1 tablet (50 mg total) by mouth daily.   metoprolol tartrate 100 MG tablet Commonly known as:  LOPRESSOR TAKE 1 TABLET BY MOUTH TWICE A DAY   Ozempic (1 MG/DOSE) 2 MG/1.5ML Sopn Generic drug:   Semaglutide (1 MG/DOSE) INJECT 1MG UNDER THE SKIN ONCE WEEKLY.   rivaroxaban 20 MG Tabs tablet Commonly known as:  Xarelto Take 1 tablet (20 mg total) by mouth daily with supper.   Turmeric 450 MG Caps Take by mouth.   Vascepa 1 g Caps Generic drug:  Icosapent Ethyl TAKE 2 CAPSULES BY MOUTH 2 TIMES DAILY.   Vitamin D (Cholecalciferol) 10 MCG (400 UNIT) Tabs Take 400 Units by mouth daily.       Allergies:  Allergies  Allergen Reactions   Lupron [Leuprolide] Swelling and Other (See Comments)    Put in A-fib    Past Medical History:  Diagnosis Date   Atrial fibrillation ([place])    Diabetes mellitus without complication ([place])    DVT (deep venous thrombosis) ([facility])    left leg.   GERD (gastroesophageal reflux disease)    Hypercholesterolemia  Hypertension    Prostate cancer ([place]) 12/06/13   gleason 4+3=7, 6/12 cores positive. seed implant and radiation    Past Surgical History:  Procedure Laterality Date   APPENDECTOMY     BALLOON DILATION N/A 08/02/2015   Procedure: BALLOON DILATION;  Surgeon: [name], MD;  Location: [facility];  Service: Endoscopy;  Laterality: N/A;   CARDIOVERSION N/A 05/13/2018   Procedure: CARDIOVERSION;  Surgeon: Sanda Klein, MD;  Location: [facility];  Service: Cardiovascular;  Laterality: N/A;   CARDIOVERSION N/A 05/18/2018   Procedure: CARDIOVERSION;  Surgeon: Buford Dresser, MD;  Location: [facility];  Service: Cardiovascular;  Laterality: N/A;   COLONOSCOPY WITH PROPOFOL N/A 08/02/2015   Procedure: COLONOSCOPY WITH PROPOFOL w/ APC;  Surgeon: [name], MD;  Location: [facility];  Service: Endoscopy;  Laterality: N/A;   ESOPHAGOGASTRODUODENOSCOPY (EGD) WITH PROPOFOL N/A 08/02/2015   Procedure: ESOPHAGOGASTRODUODENOSCOPY (EGD) WITH PROPOFOL/ possible dilation.;  Surgeon: [name], MD;  Location: [facility];  Service: Endoscopy;  Laterality: N/A;   KNEE SURGERY     right knee orthoscopic    PROSTATE BIOPSY  12/06/13   Gleason 4+3=7, volume 35 gm   SPINE SURGERY   1964 and 1967   Tourette'ssyndrome   TONSILLECTOMY      Family History  Problem Relation Age of Onset   Heart disease Mother        before age 16   Diabetes Mother    Hyperlipidemia Mother    Varicose Veins Mother    Hypertension Mother    Heart disease Father    Diabetes Father    Hyperlipidemia Father    Hypertension Father    Cancer Brother    COPD Brother    Diabetes Brother    Hyperlipidemia Brother    Hypertension Brother     Social History:  reports that he quit smoking about 6 years ago. His smoking use included cigarettes. He has a 11.50 pack-year smoking history. He has never used smokeless tobacco. He reports current alcohol use. He reports that he does not use drugs.    Review of Systems        Lipids: he has had significant hyperlipidemia and has been treated with Lipitor 40 mg daily and is also taking Vascepa for triglycerides Has no history of CAD His diet is relatively low in fat  Continues to have low HDL Triglycerides are back to normal recently and also last LDL is now below 100         Lab Results  Component Value Date   CHOL 144 02/03/2019   CHOL 169 04/28/2018   CHOL 153 10/29/2017   Lab Results  Component Value Date   HDL 34.10 (L) 02/03/2019   HDL 32.20 (L) 04/28/2018   HDL 30.40 (L) 10/29/2017   Lab Results  Component Value Date   LDLCALC 84 02/03/2019   LDLCALC 104 (H) 04/28/2018   LDLCALC 92 10/29/2017   Lab Results  Component Value Date   TRIG 128.0 02/03/2019   TRIG 166.0 (H) 04/28/2018   TRIG 152.0 (H) 10/29/2017   Lab Results  Component Value Date   CHOLHDL 4 02/03/2019   CHOLHDL 5 04/28/2018   CHOLHDL 5 10/29/2017   Lab Results  Component Value Date   LDLDIRECT 93.0 12/30/2016   LDLDIRECT 103.0 02/08/2015                   The blood pressure has been treated with losartan 25 mg and low-dose metoprolol, followed by PCP  and cardiologist    BP Readings from Last 3 Encounters:  11/10/18 108/60  09/24/18 104/70  08/10/18 130/78    RENAL function is stable  Lab Results  Component Value Date   CREATININE 1.27 02/03/2019   CREATININE 1.22 11/08/2018   CREATININE 1.24 09/24/2018    Followed by cardiologist for atrial fibrillation   LABS:  Lab on 02/03/2019  Component Date Value Ref Range Status   Microalb, Ur 02/03/2019 4.2* 0.0 - 1.9 mg/dL Final   Creatinine,U 02/03/2019 74.4  mg/dL Final   Microalb Creat Ratio 02/03/2019 5.7  0.0 - 30.0 mg/g Final   Cholesterol 02/03/2019 144  0 - 200 mg/dL Final   ATP III Classification       Desirable:  < 200 mg/dL               Borderline High:  200 - 239 mg/dL          High:  > = 240 mg/dL   Triglycerides 02/03/2019 128.0  0.0 - 149.0 mg/dL Final   Normal:  <150 mg/dLBorderline High:  150 - 199 mg/dL   HDL 02/03/2019 34.10* >39.00 mg/dL Final   VLDL 02/03/2019 25.6  0.0 - 40.0 mg/dL Final   LDL Cholesterol 02/03/2019 84  0 - 99 mg/dL Final   Total CHOL/HDL Ratio 02/03/2019 4   Final                  Men          Women1/2 Average Risk     3.4          3.3Average Risk          5.0          4.42X Average Risk          9.6          7.13X Average Risk          15.0          11.0                       NonHDL 02/03/2019 109.98   Final   NOTE:  Non-HDL goal should be 30 mg/dL higher than patient's LDL goal (i.e. LDL goal of < 70 mg/dL, would have non-HDL goal of < 100 mg/dL)   Sodium 02/03/2019 141  135 - 145 mEq/L Final   Potassium 02/03/2019 5.2 Hemolysis seen..* 3.5 - 5.1 mEq/L Final   Chloride 02/03/2019 105  96 - 112 mEq/L Final   CO2 02/03/2019 25  19 - 32 mEq/L Final   Glucose, Bld 02/03/2019 111* 70 - 99 mg/dL Final   BUN 02/03/2019 24* 6 - 23 mg/dL Final   Creatinine, Ser 02/03/2019 1.27  0.40 - 1.50 mg/dL Final   Total Bilirubin 02/03/2019 0.6  0.2 - 1.2 mg/dL Final   Alkaline Phosphatase 02/03/2019 64  39 - 117 U/L Final   AST  02/03/2019 13  0 - 37 U/L Final   ALT 02/03/2019 12  0 - 53 U/L Final   Total Protein 02/03/2019 7.2  6.0 - 8.3 g/dL Final   Albumin 02/03/2019 4.1  3.5 - 5.2 g/dL Final   Calcium 02/03/2019 9.7  8.4 - 10.5 mg/dL Final   GFR 02/03/2019 56.68* >60.00 mL/min Final   Hgb A1c MFr Bld 02/03/2019 7.4* 4.6 - 6.5 % Final   Glycemic Control Guidelines for People with Diabetes:Non Diabetic:  <6%Goal of Therapy: <7%Additional Action Suggested:  >8%  Physical Examination:  There were no vitals taken for this visit.  Diabetes type 2, morbidly obese, non-insulin-dependent   See history of present illness for detailed discussion of his current management, blood sugar patterns and problems identified   His A1c is still relatively high at 7.4  His blood sugars appear to be recently better at home and his meter seems to be correlating with his office lab glucose Not clear how to explain the A1c being still higher even though his average blood sugar at home is under 140 Has only minimal side effects from [place] and he is continuing to take this regularly   HYPERTENSION: Mild and reportedly well-controlled No microalbuminuria  Mixed dyslipidemia: He has an LDL of 84 but no history of CAD Overall lipids are better, likely to be from improve diet  Currently on Lipitor 40 mg     PLAN:  He will take Amaryl before dinner He will stay on 1 mg Ozempic As before continue to check glucose at various times Considering that he is following a good lifestyle and losing weight does not need any change in overall management  There are no Patient Instructions on file for this visit.   Total visit encounter with telephone call =7 minutes   Elayne Snare 02/07/2019, 8:04 AM   Note: This office note was prepared with Dragon voice recognition system technology. Any transcriptional errors that result from this process are unintentional.

## 2019-02-10 ENCOUNTER — Other Ambulatory Visit: Payer: Self-pay | Admitting: Endocrinology

## 2019-02-24 ENCOUNTER — Encounter: Payer: Self-pay | Admitting: Internal Medicine

## 2019-02-24 ENCOUNTER — Ambulatory Visit (INDEPENDENT_AMBULATORY_CARE_PROVIDER_SITE_OTHER): Payer: BC Managed Care – PPO | Admitting: Internal Medicine

## 2019-02-24 ENCOUNTER — Other Ambulatory Visit: Payer: Self-pay

## 2019-02-24 VITALS — BP 128/82 | HR 105 | Temp 97.8°F | Ht 71.0 in | Wt 311.0 lb

## 2019-02-24 DIAGNOSIS — Z0001 Encounter for general adult medical examination with abnormal findings: Secondary | ICD-10-CM

## 2019-02-24 DIAGNOSIS — K219 Gastro-esophageal reflux disease without esophagitis: Secondary | ICD-10-CM

## 2019-02-24 DIAGNOSIS — K5900 Constipation, unspecified: Secondary | ICD-10-CM | POA: Diagnosis not present

## 2019-02-24 DIAGNOSIS — S81801D Unspecified open wound, right lower leg, subsequent encounter: Secondary | ICD-10-CM

## 2019-02-24 DIAGNOSIS — I10 Essential (primary) hypertension: Secondary | ICD-10-CM | POA: Diagnosis not present

## 2019-02-24 DIAGNOSIS — I4891 Unspecified atrial fibrillation: Secondary | ICD-10-CM

## 2019-02-24 DIAGNOSIS — E785 Hyperlipidemia, unspecified: Secondary | ICD-10-CM | POA: Diagnosis not present

## 2019-02-24 DIAGNOSIS — Z0000 Encounter for general adult medical examination without abnormal findings: Secondary | ICD-10-CM | POA: Insufficient documentation

## 2019-02-24 DIAGNOSIS — R197 Diarrhea, unspecified: Secondary | ICD-10-CM | POA: Diagnosis not present

## 2019-02-24 DIAGNOSIS — L0390 Cellulitis, unspecified: Secondary | ICD-10-CM | POA: Diagnosis not present

## 2019-02-24 DIAGNOSIS — S81801A Unspecified open wound, right lower leg, initial encounter: Secondary | ICD-10-CM | POA: Insufficient documentation

## 2019-02-24 MED ORDER — PANTOPRAZOLE SODIUM 40 MG PO TBEC: 40.0000 mg | DELAYED_RELEASE_TABLET | Freq: Every day | ORAL | 3 refills | Status: DC

## 2019-02-24 MED ORDER — GLIMEPIRIDE 1 MG PO TABS: 1.0000 mg | ORAL_TABLET | Freq: Every day | ORAL | 1 refills | Status: DC

## 2019-02-24 MED ORDER — ATORVASTATIN CALCIUM 40 MG PO TABS: ORAL_TABLET | ORAL | 1 refills | Status: DC

## 2019-02-24 MED ORDER — LOSARTAN POTASSIUM 50 MG PO TABS: 50.0000 mg | ORAL_TABLET | Freq: Every day | ORAL | 1 refills | Status: DC

## 2019-02-24 MED ORDER — AMLODIPINE BESYLATE 2.5 MG PO TABS: 2.5000 mg | ORAL_TABLET | Freq: Every day | ORAL | 1 refills | Status: DC

## 2019-02-24 MED ORDER — RIVAROXABAN 20 MG PO TABS: 20.0000 mg | ORAL_TABLET | Freq: Every day | ORAL | 1 refills | Status: DC

## 2019-02-24 NOTE — Progress Notes (Signed)
Subjective:    Patient ID: Tommy Yang, male    DOB: 06/01/1951, 68 y.o.   MRN: [id number]  HPI    Here for wellness and f/u;  Overall doing ok;  Pt denies Chest pain, worsening SOB, DOE, wheezing, orthopnea, PND, worsening LE edema, palpitations, dizziness or syncope.  Pt denies neurological change such as new headache, facial or extremity weakness.  Pt denies polydipsia, polyuria, or low sugar symptoms. Pt states overall good compliance with treatment and medications, good tolerability, and has been trying to follow appropriate diet.  Pt denies worsening depressive symptoms, suicidal ideation or panic. No fever, night sweats, wt loss, loss of appetite, or other constitutional symptoms.  Pt states good ability with ADL's, has low fall risk, home safety reviewed and adequate, no other significant changes in hearing or vision, and only occasionally active with exercise.  Sees Dr Dwyane Dee endo for DM.   Also, has had mild to mod worsening reflux symptoms x 3 days, but no abd pain, dysphagia, n/v, bowel change or blood. Worse with spicy foods, better with bland foods; Also c/o 1 wk onset RLE ulcer wound after scratching at the leg while in [employer] last wk, with some increased pain and redness but no d/c or drainage, no fever in last 2 days.   Past Medical History:  Diagnosis Date  . Atrial fibrillation ([place])   . Diabetes mellitus without complication ([place])   . DVT (deep venous thrombosis) (H[facility])    left leg.  [name] GERD (gastroesophageal reflux disease)   . Hypercholesterolemia   . Hypertension   . Prostate cancer ([place]) 12/06/13   gleason 4+3=7, 6/12 cores positive. seed implant and radiation   Past Surgical History:  Procedure Laterality Date  . APPENDECTOMY    . BALLOON DILATION N/A 08/02/2015   Procedure: BALLOON DILATION;  Surgeon: [name], MD;  Location: [facility];  Service: Endoscopy;  Laterality: N/A;  . CARDIOVERSION N/A 05/13/2018   Procedure: CARDIOVERSION;  Surgeon:  Sanda Klein, MD;  Location: [facility];  Service: Cardiovascular;  Laterality: N/A;  . CARDIOVERSION N/A 05/18/2018   Procedure: CARDIOVERSION;  Surgeon: Buford Dresser, MD;  Location: [facility];  Service: Cardiovascular;  Laterality: N/A;  . COLONOSCOPY WITH PROPOFOL N/A 08/02/2015   Procedure: COLONOSCOPY WITH PROPOFOL w/ APC;  Surgeon: [name], MD;  Location: [facility];  Service: Endoscopy;  Laterality: N/A;  . ESOPHAGOGASTRODUODENOSCOPY (EGD) WITH PROPOFOL N/A 08/02/2015   Procedure: ESOPHAGOGASTRODUODENOSCOPY (EGD) WITH PROPOFOL/ possible dilation.;  Surgeon: [name], MD;  Location: [facility];  Service: Endoscopy;  Laterality: N/A;  . KNEE SURGERY     right knee orthoscopic  . PROSTATE BIOPSY  12/06/13   Gleason 4+3=7, volume 35 gm  . [place]  . TONSILLECTOMY      reports that he quit smoking about 6 years ago. His smoking use included cigarettes. He has a 11.50 pack-year smoking history. He has never used smokeless tobacco. He reports current alcohol use. He reports that he does not use drugs. family history includes COPD in his brother; Cancer in his brother; Diabetes in his brother, father, and mother; Heart disease in his father and mother; Hyperlipidemia in his brother, father, and mother; Hypertension in his brother, father, and mother; Varicose Veins in his mother. Allergies  Allergen Reactions  . Lupron [Leuprolide] Swelling and Other (See Comments)    Put in A-fib   Current Outpatient Medications on File Prior to Visit  Medication Sig Dispense Refill  . blood glucose meter kit and supplies KIT Test blood sugar daily as directed. Dx code: E11.9 1 each 0  . calcium-vitamin D (OSCAL WITH D) 250-125 MG-UNIT tablet Take 1 tablet by mouth daily.    [name] CINNAMON PO Take 1,000 mg by mouth daily.    [name] Coral Calcium 1000 (390 Ca) MG TABS Take 1,000 mg by mouth daily.    . fluticasone (FLONASE) 50 MCG/ACT nasal  spray Place 2 sprays into both nostrils daily as needed for allergies.  99  . glucose blood (ACCU-CHEK GUIDE) test strip Use as instructed to check blood sugar twice daily. 100 each 3  . leuprolide (LUPRON) 11.25 MG injection Inject 11.25 mg into the muscle every 3 (three) months.    . metoprolol tartrate (LOPRESSOR) 100 MG tablet TAKE 1 TABLET BY MOUTH TWICE A DAY 60 tablet 6  . OZEMPIC, 1 MG/DOSE, 2 MG/1.5ML SOPN INJECT 1MG UNDER THE SKIN ONCE WEEKLY. 6 pen 1  . SYNJARDY 12.12-998 MG TABS TAKE 2 TABLETS BY MOUTH EVERY DAY 180 tablet 1  . Turmeric 450 MG CAPS Take by mouth.    [name] VASCEPA 1 g CAPS TAKE 2 CAPSULES BY MOUTH 2 TIMES DAILY. 120 capsule 0  . Vitamin D, Cholecalciferol, 400 UNITS TABS Take 400 Units by mouth daily.     No current facility-administered medications on file prior to visit.    Review of Systems Constitutional: Negative for other unusual diaphoresis, sweats, appetite or weight changes HENT: Negative for other worsening hearing loss, ear pain, facial swelling, mouth sores or neck stiffness.   Eyes: Negative for other worsening pain, redness or other visual disturbance.  Respiratory: Negative for other stridor or swelling Cardiovascular: Negative for other palpitations or other chest pain  Gastrointestinal: Negative for worsening diarrhea or loose stools, blood in stool, distention or other pain Genitourinary: Negative for hematuria, flank pain or other change in urine volume.  Musculoskeletal: Negative for myalgias or other joint swelling.  Skin: Negative for other color change, or other wound or worsening drainage.  Neurological: Negative for other syncope or numbness. Hematological: Negative for other adenopathy or swelling Psychiatric/Behavioral: Negative for hallucinations, other worsening agitation, SI, self-injury, or new decreased concentration ALl other system neg per pt    Objective:   Physical Exam .BP 128/82   Pulse (!) 105   Temp 97.8 F (36.6 C)  (Oral)   Ht _0  (1.803 m)   Wt (!) 311 lb (141.1 kg)   SpO2 95%   BMI 43.38 kg/m  VS noted,  Constitutional: Pt is oriented to person, place, and time. Appears well-developed and well-nourished, in no significant distress and comfortable Head: Normocephalic and atraumatic  Eyes: Conjunctivae and EOM are normal. Pupils are equal, round, and reactive to light Right Ear: External ear normal without discharge Left Ear: External ear normal without discharge Nose: Nose without discharge or deformity Mouth/Throat: Oropharynx is without other ulcerations and moist  Neck: Normal range of motion. Neck supple. No JVD present. No tracheal deviation present or significant neck LA or mass Cardiovascular: Normal rate, regular rhythm, normal heart sounds and intact distal pulses.   Pulmonary/Chest: WOB normal and breath sounds without rales or wheezing  Abdominal: Soft. Bowel sounds are normal. NT. No HSM  Musculoskeletal: Normal range of motion. Exhibits no edema Lymphadenopathy: Has no other cervical adenopathy.  Neurological: Pt is alert and oriented to person, place, and time. Pt has normal reflexes. No cranial nerve deficit. Motor grossly intact, Gait  intact Skin: Skin is warm and dry. No rash noted, + distal right lateral new ulceration noted approx 1 cm x 0.5 cm x 5 mm with sight tender redness surrouding but no sginficant swelling or drainage  Psychiatric:  Has normal mood and affect. Behavior is normal without agitation No other exam findings  Lab Results  Component Value Date   WBC 10.2 09/24/2018   HGB 15.1 09/24/2018   HCT 46.1 09/24/2018   PLT 239.0 09/24/2018   GLUCOSE 111 (H) 02/03/2019   CHOL 144 02/03/2019   TRIG 128.0 02/03/2019   HDL 34.10 (L) 02/03/2019   LDLDIRECT 93.0 12/30/2016   LDLCALC 84 02/03/2019   ALT 12 02/03/2019   AST 13 02/03/2019   NA 141 02/03/2019   K 5.2 Hemolysis seen.. (H) 02/03/2019   CL 105 02/03/2019   CREATININE 1.27 02/03/2019   BUN 24 (H)  02/03/2019   CO2 25 02/03/2019   TSH 1.160 03/01/2014   PSA 6.18 (H) 05/06/2013   INR 1.55 (H) 10/19/2013   HGBA1C 7.4 (H) 02/03/2019   MICROALBUR 4.2 (H) 02/03/2019      Assessment & Plan:

## 2019-02-24 NOTE — Patient Instructions (Signed)
Please take all new medication as prescribed - the doxycycline as you have  I would take the Augmentin only if you are worsening in the next 1-2 days  You will be contacted regarding the referral for: Wound clinic  Please take all new medication as prescribed - the Protonix 40 mg for reflux  Please continue all other medications as before, and refills have been done if requested.  Please have the pharmacy call with any other refills you may need.  Please continue your efforts at being more active, low cholesterol diet, and weight control.  You are otherwise up to date with prevention measures today.  Please keep your appointments with your specialists as you may have planned  Please return in 6 months, or sooner if needed, with Lab testing done 3-5 days before

## 2019-02-27 ENCOUNTER — Encounter: Payer: Self-pay | Admitting: Internal Medicine

## 2019-02-27 NOTE — Assessment & Plan Note (Signed)

## 2019-02-27 NOTE — Assessment & Plan Note (Signed)
With possible very mild infection, for doxy course, hold augmentin for now unless worsens, refer [facility] wound clinic  In addition to the time spent performing CPE, I spent an additional 25 minutes face to face,in which greater than 50% of this time was spent in counseling and coordination of care for patient's acute illness as documented, including the differential dx, treatment, further evaluation and other management of right leg wound, afib, HLD, gerd, HTN,

## 2019-02-27 NOTE — Assessment & Plan Note (Signed)
Mild to mod symptomatic, for protonix 40 qd

## 2019-02-28 ENCOUNTER — Other Ambulatory Visit: Payer: Self-pay | Admitting: Endocrinology

## 2019-03-01 ENCOUNTER — Other Ambulatory Visit: Payer: Self-pay

## 2019-03-01 ENCOUNTER — Encounter (HOSPITAL_COMMUNITY): Payer: Self-pay | Admitting: Nurse Practitioner

## 2019-03-01 ENCOUNTER — Ambulatory Visit (HOSPITAL_COMMUNITY)
Admission: RE | Admit: 2019-03-01 | Discharge: 2019-03-01 | Disposition: A | Discharge status: Home / Self Care | Payer: BC Managed Care – PPO | Source: Ambulatory Visit | Attending: Nurse Practitioner | Admitting: Nurse Practitioner

## 2019-03-01 VITALS — BP 110/74 | HR 81 | Ht 71.0 in | Wt 310.0 lb

## 2019-03-01 DIAGNOSIS — Z7901 Long term (current) use of anticoagulants: Secondary | ICD-10-CM | POA: Insufficient documentation

## 2019-03-01 DIAGNOSIS — I10 Essential (primary) hypertension: Secondary | ICD-10-CM | POA: Insufficient documentation

## 2019-03-01 DIAGNOSIS — K219 Gastro-esophageal reflux disease without esophagitis: Secondary | ICD-10-CM | POA: Insufficient documentation

## 2019-03-01 DIAGNOSIS — Z79899 Other long term (current) drug therapy: Secondary | ICD-10-CM | POA: Diagnosis not present

## 2019-03-01 DIAGNOSIS — I4819 Other persistent atrial fibrillation: Secondary | ICD-10-CM | POA: Diagnosis not present

## 2019-03-01 DIAGNOSIS — Z87891 Personal history of nicotine dependence: Secondary | ICD-10-CM | POA: Diagnosis not present

## 2019-03-01 DIAGNOSIS — E119 Type 2 diabetes mellitus without complications: Secondary | ICD-10-CM | POA: Diagnosis not present

## 2019-03-01 DIAGNOSIS — Z8249 Family history of ischemic heart disease and other diseases of the circulatory system: Secondary | ICD-10-CM | POA: Diagnosis not present

## 2019-03-01 DIAGNOSIS — Z7984 Long term (current) use of oral hypoglycemic drugs: Secondary | ICD-10-CM | POA: Diagnosis not present

## 2019-03-01 DIAGNOSIS — Z86718 Personal history of other venous thrombosis and embolism: Secondary | ICD-10-CM | POA: Insufficient documentation

## 2019-03-01 DIAGNOSIS — I4811 Longstanding persistent atrial fibrillation: Secondary | ICD-10-CM | POA: Insufficient documentation

## 2019-03-01 DIAGNOSIS — Z8546 Personal history of malignant neoplasm of prostate: Secondary | ICD-10-CM | POA: Diagnosis not present

## 2019-03-01 DIAGNOSIS — E7800 Pure hypercholesterolemia, unspecified: Secondary | ICD-10-CM | POA: Insufficient documentation

## 2019-03-01 DIAGNOSIS — R0683 Snoring: Secondary | ICD-10-CM | POA: Insufficient documentation

## 2019-03-01 NOTE — Patient Instructions (Signed)
Your physician has requested that you have an echocardiogram. Echocardiography is a painless test that uses sound waves to create images of your heart. It provides your doctor with information about the size and shape of your heart and how well your heart's chambers and valves are working. This procedure takes approximately one hour. There are no restrictions for this procedure.  You are scheduled for 05/02/2019 at 2 P.M.

## 2019-03-01 NOTE — Progress Notes (Signed)
Date:  03/01/2019   ID:  Tommy Yang, DOB Jul 29, 1951, MRN [id number]  Location: home   Provider location: [address] [place], [place] [address] Evaluation Performed:Follow up   PCP:  Tommy Borg, MD  Primary Cardiologist:  None  Primary Electrophysiologist: none   ZM:CEYEMV fibrillation   History of Present Illness: Tommy Yang is a 68 y.o. male who presents for f/u today. He has longstanding persistent afib. He feels he may have had afib for 2 years befor he was diagnosed.  On previous visits, he has been offered AAD therapy which he has deferred as he feels he is minimally symptomatic. He does have some snoring. Possible apnea, but  pt states he would not be able to deal with a mask to treat sleep apnea. He scratched  his lower leg and being diabetic, has an appointment with the wound center to make sure it is healing.  Today, he denies symptoms of palpitations, chest pain, shortness of breath, orthopnea, PND, lower extremity edema, claudication, dizziness, presyncope, syncope, bleeding, or neurologic sequela. The patient is tolerating medications without difficulties and is otherwise without complaint today.   he denies symptoms of cough, fevers, chills, or new SOB worrisome for COVID 19.    Filed Weights   03/01/19 0830  Weight: (!) 140.6 kg    Past Medical History:  Diagnosis Date  . Atrial fibrillation ([facility])   . Diabetes mellitus without complication ([facility])   . DVT (deep venous thrombosis) ([facility])    left leg.  [name] GERD (gastroesophageal reflux disease)   . Hypercholesterolemia   . Hypertension   . Prostate cancer ([facility]) 12/06/13   gleason 4+3=7, 6/12 cores positive. seed implant and radiation   Past Surgical History:  Procedure Laterality Date  . APPENDECTOMY    . BALLOON DILATION N/A 08/02/2015   Procedure: BALLOON DILATION;  Surgeon: [name], MD;  Location: [facility];  Service: Endoscopy;  Laterality: N/A;  . CARDIOVERSION N/A 05/13/2018   Procedure: CARDIOVERSION;  Surgeon: Sanda Klein, MD;  Location: [facility];  Service: Cardiovascular;  Laterality: N/A;  . CARDIOVERSION N/A 05/18/2018   Procedure: CARDIOVERSION;  Surgeon: Buford Dresser, MD;  Location: [facility];  Service: Cardiovascular;  Laterality: N/A;  . COLONOSCOPY WITH PROPOFOL N/A 08/02/2015   Procedure: COLONOSCOPY WITH PROPOFOL w/ APC;  Surgeon: [name], MD;  Location: [facility];  Service: Endoscopy;  Laterality: N/A;  . ESOPHAGOGASTRODUODENOSCOPY (EGD) WITH PROPOFOL N/A 08/02/2015   Procedure: ESOPHAGOGASTRODUODENOSCOPY (EGD) WITH PROPOFOL/ possible dilation.;  Surgeon: [name], MD;  Location: [facility];  Service: Endoscopy;  Laterality: N/A;  . KNEE SURGERY     right knee orthoscopic  . PROSTATE BIOPSY  12/06/13   Gleason 4+3=7, volume 35 gm  . [place]  . TONSILLECTOMY       Current Outpatient Medications  Medication Sig Dispense Refill  . amLODipine (NORVASC) 2.5 MG tablet Take 1 tablet (2.5 mg total) by mouth daily. 90 tablet 1  . atorvastatin (LIPITOR) 40 MG tablet TAKE ONE TABLET BY MOUTH ONCE DAILY. 90 tablet 1  . blood glucose meter kit and supplies KIT Test blood sugar daily as directed. Dx code: E11.9 1 each 0  . calcium-vitamin D (OSCAL WITH D) 250-125 MG-UNIT tablet Take 1 tablet by mouth daily.    [name] CINNAMON PO Take 1,000 mg by mouth daily.    . [place] Calcium 1000 (390 Ca) MG TABS Take  1,000 mg by mouth daily.    [name] glimepiride (AMARYL) 1 MG tablet Take 1 tablet (1 mg total) by mouth daily before supper. 90 tablet 1  . glucose blood (ACCU-CHEK GUIDE) test strip Use as instructed to check blood sugar twice daily. 100 each 3  . leuprolide (LUPRON) 11.25 MG injection Inject 11.25 mg into the muscle every 3 (three) months.    [name] losartan (COZAAR) 50 MG tablet Take 1 tablet (50 mg total) by mouth daily. 90 tablet 1  . metoprolol tartrate (LOPRESSOR) 100 MG tablet TAKE 1 TABLET  BY MOUTH TWICE A DAY 60 tablet 6  . OZEMPIC, 1 MG/DOSE, 2 MG/1.5ML SOPN INJECT 1MG UNDER THE SKIN ONCE WEEKLY. 6 pen 1  . pantoprazole (PROTONIX) 40 MG tablet Take 1 tablet (40 mg total) by mouth daily. 90 tablet 3  . rivaroxaban (XARELTO) 20 MG TABS tablet Take 1 tablet (20 mg total) by mouth daily with supper. 90 tablet 1  . SYNJARDY 12.12-998 MG TABS TAKE 2 TABLETS BY MOUTH EVERY DAY 180 tablet 1  . Turmeric 450 MG CAPS Take by mouth.    [name] VASCEPA 1 g CAPS TAKE 2 CAPSULES BY MOUTH TWICE A DAY 120 capsule 0  . Vitamin D, Cholecalciferol, 400 UNITS TABS Take 400 Units by mouth daily.     No current facility-administered medications for this encounter.     Allergies:   Lupron [leuprolide]   Social History:  The patient  reports that he quit smoking about 6 years ago. His smoking use included cigarettes. He has a 11.50 pack-year smoking history. He has never used smokeless tobacco. He reports current alcohol use. He reports that he does not use drugs.   Family History:  The patient's  family history includes COPD in his brother; Cancer in his brother; Diabetes in his brother, father, and mother; Heart disease in his father and mother; Hyperlipidemia in his brother, father, and mother; Hypertension in his brother, father, and mother; Varicose Veins in his mother.    ROS:  Please see the history of present illness.   All other systems are personally reviewed and negative.   Exam: NA, phone visit   Recent Labs: 04/16/2018: NT-Pro BNP 1,322 09/24/2018: Hemoglobin 15.1; Platelets 239.0 02/03/2019: ALT 12; BUN 24; Creatinine, Ser 1.27; Potassium 5.2 Hemolysis seen..; Sodium 141  personally reviewed    Other studies personally reviewed: Epic records reviewed     ASSESSMENT AND PLAN:  1.  Longstanding Persistent  atrial fibrillation  Minimally symptomatic  He is well rate controlled  Continue current rate control without change  Pt still defers  AAD therapy Continue xarelto 20 mg daily   This patients CHA2DS2-VASc Score and unadjusted Ischemic Stroke Rate (% per year) is equal to 7.2 % stroke rate/year from a score of 5 Will update echo in August to assess if there is any decline in EF with ongoing afib   Follow-up:  6 months  Current medicines are reviewed at length with the patient today.   The patient does not have concerns regarding his medicines.  The following changes were made today:  none  Labs/ tests ordered today include:none Orders Placed This Encounter  Procedures  . ECHOCARDIOGRAM COMPLETE    Patient Risk:  after full review of this patients clinical status, I feel that they are at moderate risk at this time.  Signed, Roderic Palau, NP 03/01/2019 8:59 AM  [facility] [address] [place], [place] [address] [phone]

## 2019-03-24 ENCOUNTER — Other Ambulatory Visit: Payer: Self-pay | Admitting: Endocrinology

## 2019-03-27 ENCOUNTER — Other Ambulatory Visit: Payer: Self-pay | Admitting: Endocrinology

## 2019-04-02 ENCOUNTER — Other Ambulatory Visit: Payer: Self-pay | Admitting: Endocrinology

## 2019-05-02 ENCOUNTER — Other Ambulatory Visit: Payer: Self-pay

## 2019-05-02 ENCOUNTER — Ambulatory Visit (HOSPITAL_COMMUNITY)
Admission: RE | Admit: 2019-05-02 | Discharge: 2019-05-02 | Disposition: A | Discharge status: Home / Self Care | Payer: BC Managed Care – PPO | Source: Ambulatory Visit | Attending: Nurse Practitioner | Admitting: Nurse Practitioner

## 2019-05-02 DIAGNOSIS — E119 Type 2 diabetes mellitus without complications: Secondary | ICD-10-CM | POA: Diagnosis not present

## 2019-05-02 DIAGNOSIS — I4819 Other persistent atrial fibrillation: Secondary | ICD-10-CM | POA: Diagnosis not present

## 2019-05-02 DIAGNOSIS — Z86718 Personal history of other venous thrombosis and embolism: Secondary | ICD-10-CM | POA: Insufficient documentation

## 2019-05-02 DIAGNOSIS — I10 Essential (primary) hypertension: Secondary | ICD-10-CM | POA: Diagnosis not present

## 2019-05-02 DIAGNOSIS — R9431 Abnormal electrocardiogram [ECG] [EKG]: Secondary | ICD-10-CM | POA: Diagnosis not present

## 2019-05-02 DIAGNOSIS — E785 Hyperlipidemia, unspecified: Secondary | ICD-10-CM | POA: Diagnosis not present

## 2019-05-02 DIAGNOSIS — Z87891 Personal history of nicotine dependence: Secondary | ICD-10-CM | POA: Diagnosis not present

## 2019-05-02 NOTE — Progress Notes (Signed)
  Echocardiogram 2D Echocardiogram has been performed.  Burnett Kanaris 05/02/2019, 2:52 PM

## 2019-05-03 ENCOUNTER — Encounter (HOSPITAL_COMMUNITY): Payer: Self-pay | Admitting: *Deleted

## 2019-05-10 ENCOUNTER — Other Ambulatory Visit: Payer: Self-pay

## 2019-05-10 ENCOUNTER — Other Ambulatory Visit (INDEPENDENT_AMBULATORY_CARE_PROVIDER_SITE_OTHER): Payer: BC Managed Care – PPO

## 2019-05-10 DIAGNOSIS — E1165 Type 2 diabetes mellitus with hyperglycemia: Secondary | ICD-10-CM | POA: Diagnosis not present

## 2019-05-10 LAB — COMPREHENSIVE METABOLIC PANEL
ALT: 11 U/L (ref 0–53)
AST: 11 U/L (ref 0–37)
Albumin: 4 g/dL (ref 3.5–5.2)
Alkaline Phosphatase: 84 U/L (ref 39–117)
BUN: 27 mg/dL — ABNORMAL HIGH (ref 6–23)
CO2: 30 mEq/L (ref 19–32)
Calcium: 9.6 mg/dL (ref 8.4–10.5)
Chloride: 105 mEq/L (ref 96–112)
Creatinine, Ser: 1.27 mg/dL (ref 0.40–1.50)
GFR: 56.63 mL/min — ABNORMAL LOW (ref >60.00)
Glucose, Bld: 116 mg/dL — ABNORMAL HIGH (ref 70–99)
Potassium: 4.9 mEq/L (ref 3.5–5.1)
Sodium: 141 mEq/L (ref 135–145)
Total Bilirubin: 0.7 mg/dL (ref 0.2–1.2)
Total Protein: 7.1 g/dL (ref 6.0–8.3)

## 2019-05-10 LAB — HEMOGLOBIN A1C: Hgb A1c MFr Bld: 7.2 % — ABNORMAL HIGH (ref 4.6–6.5)

## 2019-05-11 NOTE — Progress Notes (Signed)
Patient ID: Tommy Yang, male   DOB: 28-Jun-1951, 67 y.o.   MRN: [id number]           Reason for Appointment: Follow-up for Type 2 Diabetes  Today's office visit was provided via telemedicine using video technique The patient was explained the limitations of evaluation and management by telemedicine and the availability of in person appointments.  The patient understood the limitations and agreed to proceed. Patient also understood that the telehealth visit is billable. . Location of the patient: Patient's home . Location of the provider: Physician office Only the patient and myself were participating in the encounter     History of Present Illness:          Diagnosis: Type 2 diabetes mellitus, date of diagnosis: 05/2013       Past history:   He apparently had significant hyperglycemia in 2014 when seen in the urgent care center but did not establish with a PCP for control. He was started on treatment for his diabetes in 10/2013 and he was initially treated with metformin and Januvia A few months later he was also given glipizide ER to help his control when his A1c had gone up to 10.9 He says he was also seen by dietitian but no record available of this. Subsequently his blood sugar control had improved with A1c down to 6.8 in 9/15 On his consultation in 3/16 he had high blood sugars and weight gain; A1c was 9.1% with taking glipizide ER, Januvia and metformin.  Recent history:    Non-insulin hypoglycemic drugs the patient is taking are: Amaryl 1 mg at dinner Synjardy 12.12/998, 2 tablets daily, Ozempic 1 mg weekly  His A1c has been as low as 6.1 previously and is still 7.2.   Current blood sugar patterns and problems identified:  He has been checking blood sugars at different times  Most of his blood sugars are looking fairly good and has only rare higher blood sugars after dinner  He also thinks that if he is very active in the morning and does not eat breakfast his  blood sugar goes up to as high as 209  Usually taking Synjardy right before breakfast  No side effects or nausea with Ozempic  His weight fluctuates and is likely about the same as on his last visit  Also no hypoglycemia with now taking Amaryl at dinnertime with lowest blood sugar 90 has better blood sugars overall compared to his last visit  He is fairly consistent with checking sugars either fasting or couple of hours after 1 of his meals including dinner  However not clear why his A1c has gone up since his blood sugars even after meals are averaging only 143  He does take his Amaryl at 7-8 PM but not before dinner although his blood sugars after dinner do not appear to be high consistently  He may have some fullness and bloating with Ozempic occasionally but no nausea or vomiting  Tolerating 1 mg Ozempic well otherwise  Since his last visit the insurance changed his prescription from Invokana to [place]  His weight apparently is down about 8 pounds  He is trying to do some walking and more activity with lawnmowing  Side effects from medications have been: None  Compliance with the medical regimen: Fair  Hypoglycemia: None    Glucose monitoring:  done 1-2 times a day         Glucometer:  [place] Blood Glucose readings by meter report  PRE-meal average 114, range  90-138 After meal average 143 with range 112-186     Self-care: The diet that the patient has been following is: tries to limit portions and fats .      Meals: 3 meals per day. Breakfast is sometimes cereal,sausage  Fruits for snacks  Exercise:  He is trying to do walking and yardwork  Dietician visit, most recent:?  2015 .               Weight history:    Wt Readings from Last 3 Encounters:  03/01/19 (!) 310 lb (140.6 kg)  02/24/19 (!) 311 lb (141.1 kg)  11/10/18 (!) 309 lb 9.6 oz (140.4 kg)    Glycemic control:   Lab Results  Component Value Date   HGBA1C 7.2 (H) 05/10/2019   HGBA1C 7.4 (H)  02/03/2019   HGBA1C 7.4 (H) 11/08/2018   Lab Results  Component Value Date   MICROALBUR 4.2 (H) 02/03/2019   LDLCALC 84 02/03/2019   CREATININE 1.27 05/10/2019    Lab on 05/10/2019  Component Date Value Ref Range Status  . Sodium 05/10/2019 141  135 - 145 mEq/L Final  . Potassium 05/10/2019 4.9  3.5 - 5.1 mEq/L Final  . Chloride 05/10/2019 105  96 - 112 mEq/L Final  . CO2 05/10/2019 30  19 - 32 mEq/L Final  . Glucose, Bld 05/10/2019 116* 70 - 99 mg/dL Final  . BUN 05/10/2019 27* 6 - 23 mg/dL Final  . Creatinine, Ser 05/10/2019 1.27  0.40 - 1.50 mg/dL Final  . Total Bilirubin 05/10/2019 0.7  0.2 - 1.2 mg/dL Final  . Alkaline Phosphatase 05/10/2019 84  39 - 117 U/L Final  . AST 05/10/2019 11  0 - 37 U/L Final  . ALT 05/10/2019 11  0 - 53 U/L Final  . Total Protein 05/10/2019 7.1  6.0 - 8.3 g/dL Final  . Albumin 05/10/2019 4.0  3.5 - 5.2 g/dL Final  . Calcium 05/10/2019 9.6  8.4 - 10.5 mg/dL Final  . GFR 05/10/2019 56.63* >60.00 mL/min Final  . Hgb A1c MFr Bld 05/10/2019 7.2* 4.6 - 6.5 % Final   Glycemic Control Guidelines for People with Diabetes:Non Diabetic:  <6%Goal of Therapy: <7%Additional Action Suggested:  >8%       Allergies as of 05/12/2019      Reactions   Lupron [leuprolide] Swelling, Other (See Comments)   Put in A-fib      Medication List       Accurate as of May 11, 2019  8:34 PM. If you have any questions, ask your nurse or doctor.        amLODipine 2.5 MG tablet Commonly known as: NORVASC Take 1 tablet (2.5 mg total) by mouth daily.   atorvastatin 40 MG tablet Commonly known as: LIPITOR TAKE ONE TABLET BY MOUTH ONCE DAILY.   blood glucose meter kit and supplies Kit Test blood sugar daily as directed. Dx code: E11.9   calcium-vitamin D 250-125 MG-UNIT tablet Commonly known as: OSCAL WITH D Take 1 tablet by mouth daily.   CINNAMON PO Take 1,000 mg by mouth daily.   Coral Calcium 1000 (390 Ca) MG Tabs Take 1,000 mg by mouth daily.    glimepiride 1 MG tablet Commonly known as: Amaryl Take 1 tablet (1 mg total) by mouth daily before supper.   glucose blood test strip Commonly known as: Accu-Chek Guide Use as instructed to check blood sugar twice daily.   leuprolide 11.25 MG injection Commonly known as: LUPRON Inject 11.25 mg into  the muscle every 3 (three) months.   losartan 50 MG tablet Commonly known as: COZAAR Take 1 tablet (50 mg total) by mouth daily.   metoprolol tartrate 100 MG tablet Commonly known as: LOPRESSOR TAKE 1 TABLET BY MOUTH TWICE A DAY   Ozempic (1 MG/DOSE) 2 MG/1.5ML Sopn Generic drug: Semaglutide (1 MG/DOSE) INJECT '1MG'$  UNDER THE SKIN ONCE WEEKLY.   pantoprazole 40 MG tablet Commonly known as: PROTONIX Take 1 tablet (40 mg total) by mouth daily.   rivaroxaban 20 MG Tabs tablet Commonly known as: Xarelto Take 1 tablet (20 mg total) by mouth daily with supper.   Synjardy 12.12-998 MG Tabs Generic drug: Empagliflozin-metFORMIN HCl TAKE 2 TABLETS BY MOUTH EVERY DAY   Turmeric 450 MG Caps Take by mouth.   Vascepa 1 g Caps Generic drug: Icosapent Ethyl TAKE 2 CAPSULES BY MOUTH TWICE A DAY   Vitamin D (Cholecalciferol) 10 MCG (400 UNIT) Tabs Take 400 Units by mouth daily.       Allergies:  Allergies  Allergen Reactions  . Lupron [Leuprolide] Swelling and Other (See Comments)    Put in A-fib    Past Medical History:  Diagnosis Date  . Atrial fibrillation ([facility])   . Diabetes mellitus without complication ([facility])   . DVT (deep venous thrombosis) ([facility])    left leg.  [name] GERD (gastroesophageal reflux disease)   . Hypercholesterolemia   . Hypertension   . Prostate cancer ([facility]) 12/06/13   gleason 4+3=7, 6/12 cores positive. seed implant and radiation    Past Surgical History:  Procedure Laterality Date  . APPENDECTOMY    . BALLOON DILATION N/A 08/02/2015   Procedure: BALLOON DILATION;  Surgeon: [name], MD;  Location: [facility];  Service: Endoscopy;  Laterality:  N/A;  . CARDIOVERSION N/A 05/13/2018   Procedure: CARDIOVERSION;  Surgeon: Sanda Klein, MD;  Location: [facility];  Service: Cardiovascular;  Laterality: N/A;  . CARDIOVERSION N/A 05/18/2018   Procedure: CARDIOVERSION;  Surgeon: Buford Dresser, MD;  Location: [facility];  Service: Cardiovascular;  Laterality: N/A;  . COLONOSCOPY WITH PROPOFOL N/A 08/02/2015   Procedure: COLONOSCOPY WITH PROPOFOL w/ APC;  Surgeon: [name], MD;  Location: [facility];  Service: Endoscopy;  Laterality: N/A;  . ESOPHAGOGASTRODUODENOSCOPY (EGD) WITH PROPOFOL N/A 08/02/2015   Procedure: ESOPHAGOGASTRODUODENOSCOPY (EGD) WITH PROPOFOL/ possible dilation.;  Surgeon: [name], MD;  Location: [facility];  Service: Endoscopy;  Laterality: N/A;  . KNEE SURGERY     right knee orthoscopic  . PROSTATE BIOPSY  12/06/13   Gleason 4+3=7, volume 35 gm  . [place]  . TONSILLECTOMY      Family History  Problem Relation Age of Onset  . Heart disease Mother        before age 67  . Diabetes Mother   . Hyperlipidemia Mother   . Varicose Veins Mother   . Hypertension Mother   . Heart disease Father   . Diabetes Father   . Hyperlipidemia Father   . Hypertension Father   . Cancer Brother   . COPD Brother   . Diabetes Brother   . Hyperlipidemia Brother   . Hypertension Brother     Social History:  reports that he quit smoking about 6 years ago. His smoking use included cigarettes. He has a 11.50 pack-year smoking history. He has never used smokeless tobacco. He reports current alcohol use. He reports that he does not use drugs.    Review of Systems  Lipids: he has had significant hyperlipidemia and has been treated with Lipitor 40 mg daily and is also taking Vascepa for triglycerides Has no history of CAD His diet is usually low in fat  Continues to have low HDL Triglycerides are back to normal in June and also last LDL is now below 100         Lab Results  Component Value Date   CHOL 144 02/03/2019   CHOL 169 04/28/2018   CHOL 153 10/29/2017   Lab Results  Component Value Date   HDL 34.10 (L) 02/03/2019   HDL 32.20 (L) 04/28/2018   HDL 30.40 (L) 10/29/2017   Lab Results  Component Value Date   LDLCALC 84 02/03/2019   LDLCALC 104 (H) 04/28/2018   LDLCALC 92 10/29/2017   Lab Results  Component Value Date   TRIG 128.0 02/03/2019   TRIG 166.0 (H) 04/28/2018   TRIG 152.0 (H) 10/29/2017   Lab Results  Component Value Date   CHOLHDL 4 02/03/2019   CHOLHDL 5 04/28/2018   CHOLHDL 5 10/29/2017   Lab Results  Component Value Date   LDLDIRECT 93.0 12/30/2016   LDLDIRECT 103.0 02/08/2015                   The blood pressure has been treated with losartan 25 mg and low-dose metoprolol, followed by PCP and cardiologist    BP Readings from Last 3 Encounters:  03/01/19 110/74  02/24/19 128/82  11/10/18 108/60    RENAL function is consistently normal  Lab Results  Component Value Date   CREATININE 1.27 05/10/2019   CREATININE 1.27 02/03/2019   CREATININE 1.22 11/08/2018    Followed by cardiologist for atrial fibrillation   LABS:  Lab on 05/10/2019  Component Date Value Ref Range Status  . Sodium 05/10/2019 141  135 - 145 mEq/L Final  . Potassium 05/10/2019 4.9  3.5 - 5.1 mEq/L Final  . Chloride 05/10/2019 105  96 - 112 mEq/L Final  . CO2 05/10/2019 30  19 - 32 mEq/L Final  . Glucose, Bld 05/10/2019 116* 70 - 99 mg/dL Final  . BUN 05/10/2019 27* 6 - 23 mg/dL Final  . Creatinine, Ser 05/10/2019 1.27  0.40 - 1.50 mg/dL Final  . Total Bilirubin 05/10/2019 0.7  0.2 - 1.2 mg/dL Final  . Alkaline Phosphatase 05/10/2019 84  39 - 117 U/L Final  . AST 05/10/2019 11  0 - 37 U/L Final  . ALT 05/10/2019 11  0 - 53 U/L Final  . Total Protein 05/10/2019 7.1  6.0 - 8.3 g/dL Final  . Albumin 05/10/2019 4.0  3.5 - 5.2 g/dL Final  . Calcium 05/10/2019 9.6  8.4 - 10.5 mg/dL Final  . GFR 05/10/2019 56.63* >60.00  mL/min Final  . Hgb A1c MFr Bld 05/10/2019 7.2* 4.6 - 6.5 % Final   Glycemic Control Guidelines for People with Diabetes:Non Diabetic:  <6%Goal of Therapy: <7%Additional Action Suggested:  >8%     Physical Examination:  There were no vitals taken for this visit.  Diabetes type 2, morbidly obese, non-insulin-dependent   See history of present illness for detailed discussion of his current management, blood sugar patterns and problems identified   His A1c is still relatively high at 7.2, has been as low as 6.1 when he was taking Invokamet instead of Synjardy This is likely to be from postprandial hyperglycemia  Still difficult for him to lose weight even with taking Ozempic and SGLT2 drugs He is trying to be as  active as possible Home blood sugars are generally fairly good and occasionally over 180   HYPERTENSION: Mild and reportedly well-controlled No microalbuminuria  Mixed dyslipidemia: Well controlled but will need follow-up on next visit       PLAN:  We will try to get authorization for Invokamet XR instead of Synjardy This will help his A1c to be back down again and likely help better with his weight loss Otherwise no change in medications Call if having any unusually high blood sugars He should have breakfast in the morning before he goes out to exercise or do yard work  Rotate times while checking blood sugars as before and discussed warfarin target of at least under 180  There are no Patient Instructions on file for this visit.      Elayne Snare 05/11/2019, 8:34 PM   Note: This office note was prepared with Dragon voice recognition system technology. Any transcriptional errors that result from this process are unintentional.

## 2019-05-12 ENCOUNTER — Encounter: Payer: Self-pay | Admitting: Endocrinology

## 2019-05-12 ENCOUNTER — Telehealth: Payer: Self-pay

## 2019-05-12 ENCOUNTER — Other Ambulatory Visit: Payer: Self-pay

## 2019-05-12 ENCOUNTER — Ambulatory Visit (INDEPENDENT_AMBULATORY_CARE_PROVIDER_SITE_OTHER): Payer: BC Managed Care – PPO | Admitting: Endocrinology

## 2019-05-12 DIAGNOSIS — E1165 Type 2 diabetes mellitus with hyperglycemia: Secondary | ICD-10-CM | POA: Diagnosis not present

## 2019-05-12 DIAGNOSIS — E782 Mixed hyperlipidemia: Secondary | ICD-10-CM

## 2019-05-12 MED ORDER — INVOKAMET XR 150-1000 MG PO TB24: 2.0000 | ORAL_TABLET | Freq: Every day | ORAL | 2 refills | Status: DC

## 2019-05-12 NOTE — Telephone Encounter (Signed)
PA initiated via [email or web address] for Invokamet XR. STAMATIS WAKELY Key: [id number] - Rx #[id number] Need help? Call us at [phone] Status Sent to [facility] Invokamet XR 150-1000MG  er tablets Form Charity fundraiser PA Form (NCPDP) Original Claim Info- 20

## 2019-05-16 ENCOUNTER — Other Ambulatory Visit: Payer: Self-pay | Admitting: Internal Medicine

## 2019-05-16 DIAGNOSIS — I10 Essential (primary) hypertension: Secondary | ICD-10-CM

## 2019-05-16 MED ORDER — LOSARTAN POTASSIUM 50 MG PO TABS: 50.0000 mg | ORAL_TABLET | Freq: Every day | ORAL | 1 refills | Status: DC

## 2019-05-16 NOTE — Telephone Encounter (Signed)
Received fax from [facility] stating that the PA for Invokamet XR has been approved from 05/13/2019 through 05/12/2020.

## 2019-05-16 NOTE — Telephone Encounter (Signed)
Pt is calling and per pt the pharm said they has been calling and patient needs a refill on losartan . [facility] [address]. Pt would like any other medications to be refill

## 2019-05-22 ENCOUNTER — Observation Stay (HOSPITAL_COMMUNITY)
Admission: EM | Admit: 2019-05-22 | Discharge: 2019-05-23 | Disposition: A | Discharge status: Home / Self Care | Payer: BC Managed Care – PPO | Attending: Internal Medicine | Admitting: Internal Medicine

## 2019-05-22 ENCOUNTER — Other Ambulatory Visit: Payer: Self-pay

## 2019-05-22 ENCOUNTER — Encounter (HOSPITAL_COMMUNITY): Payer: Self-pay

## 2019-05-22 ENCOUNTER — Emergency Department (HOSPITAL_COMMUNITY): Payer: BC Managed Care – PPO

## 2019-05-22 DIAGNOSIS — Z6841 Body Mass Index (BMI) 40.0 and over, adult: Secondary | ICD-10-CM | POA: Diagnosis not present

## 2019-05-22 DIAGNOSIS — R0602 Shortness of breath: Secondary | ICD-10-CM | POA: Diagnosis not present

## 2019-05-22 DIAGNOSIS — K219 Gastro-esophageal reflux disease without esophagitis: Secondary | ICD-10-CM | POA: Diagnosis not present

## 2019-05-22 DIAGNOSIS — Z79899 Other long term (current) drug therapy: Secondary | ICD-10-CM | POA: Diagnosis not present

## 2019-05-22 DIAGNOSIS — Z86718 Personal history of other venous thrombosis and embolism: Secondary | ICD-10-CM | POA: Diagnosis not present

## 2019-05-22 DIAGNOSIS — Z20828 Contact with and (suspected) exposure to other viral communicable diseases: Secondary | ICD-10-CM | POA: Insufficient documentation

## 2019-05-22 DIAGNOSIS — I480 Paroxysmal atrial fibrillation: Principal | ICD-10-CM | POA: Diagnosis present

## 2019-05-22 DIAGNOSIS — E1165 Type 2 diabetes mellitus with hyperglycemia: Secondary | ICD-10-CM | POA: Diagnosis present

## 2019-05-22 DIAGNOSIS — R079 Chest pain, unspecified: Secondary | ICD-10-CM | POA: Diagnosis not present

## 2019-05-22 DIAGNOSIS — C61 Malignant neoplasm of prostate: Secondary | ICD-10-CM | POA: Diagnosis present

## 2019-05-22 DIAGNOSIS — E785 Hyperlipidemia, unspecified: Secondary | ICD-10-CM | POA: Diagnosis not present

## 2019-05-22 DIAGNOSIS — Z8546 Personal history of malignant neoplasm of prostate: Secondary | ICD-10-CM | POA: Insufficient documentation

## 2019-05-22 DIAGNOSIS — E119 Type 2 diabetes mellitus without complications: Secondary | ICD-10-CM | POA: Diagnosis not present

## 2019-05-22 DIAGNOSIS — I10 Essential (primary) hypertension: Secondary | ICD-10-CM | POA: Insufficient documentation

## 2019-05-22 DIAGNOSIS — Z7901 Long term (current) use of anticoagulants: Secondary | ICD-10-CM | POA: Diagnosis not present

## 2019-05-22 DIAGNOSIS — R0789 Other chest pain: Secondary | ICD-10-CM | POA: Diagnosis not present

## 2019-05-22 DIAGNOSIS — E7800 Pure hypercholesterolemia, unspecified: Secondary | ICD-10-CM | POA: Insufficient documentation

## 2019-05-22 DIAGNOSIS — Z7984 Long term (current) use of oral hypoglycemic drugs: Secondary | ICD-10-CM | POA: Diagnosis not present

## 2019-05-22 DIAGNOSIS — Z87891 Personal history of nicotine dependence: Secondary | ICD-10-CM | POA: Insufficient documentation

## 2019-05-22 DIAGNOSIS — I4891 Unspecified atrial fibrillation: Secondary | ICD-10-CM | POA: Diagnosis not present

## 2019-05-22 LAB — CBC
HCT: 47.7 % (ref 39.0–52.0)
Hemoglobin: 15.3 g/dL (ref 13.0–17.0)
MCH: 27.8 pg (ref 26.0–34.0)
MCHC: 32.1 g/dL (ref 30.0–36.0)
MCV: 86.6 fL (ref 80.0–100.0)
Platelets: 228 10*3/uL (ref 150–400)
RBC: 5.51 MIL/uL (ref 4.22–5.81)
RDW: 15.1 % (ref 11.5–15.5)
WBC: 13.5 10*3/uL — ABNORMAL HIGH (ref 4.0–10.5)
nRBC: 0 % (ref 0.0–0.2)

## 2019-05-22 MED ORDER — DILTIAZEM LOAD VIA INFUSION
10.0000 mg | Freq: Once | INTRAVENOUS | Status: AC
  Administered 2019-05-22: 10 mg via INTRAVENOUS
  Filled 2019-05-22: qty 10

## 2019-05-22 MED ORDER — DILTIAZEM HCL 100 MG IV SOLR
5.0000 mg/h | INTRAVENOUS | Status: DC
  Administered 2019-05-22: 5 mg/h via INTRAVENOUS
  Filled 2019-05-22 (×2): qty 100

## 2019-05-22 NOTE — ED Provider Notes (Signed)
TIME SEEN: 11:16 PM  CHIEF COMPLAINT: Chest pain and shortness of breath  HPI: Patient is a 68 year old male with history of atrial fibrillation on metoprolol and Xarelto, previous left lower extremity DVT, hypertension, hyperlipidemia, diabetes, obesity who presents to the emergency department with complaints of chest pressure like someone is sitting on his chest that started around 6 or 7 PM tonight while at rest.  Also felt nauseated and short of breath with exertion.  No diaphoresis or dizziness.  On arrival patient's heart rate was in the 150s.  States he is chronically in atrial fibrillation and has failed cardioversion before.  Reports compliance with his medications.  ROS: See HPI Constitutional: no fever  Eyes: no drainage  ENT: no runny nose   Cardiovascular:   chest pain  Resp:  SOB  GI: no vomiting GU: no dysuria Integumentary: no rash  Allergy: no hives  Musculoskeletal: no leg swelling  Neurological: no slurred speech ROS otherwise negative  PAST MEDICAL HISTORY/PAST SURGICAL HISTORY:  Past Medical History:  Diagnosis Date  . Atrial fibrillation ([place])   . Diabetes mellitus without complication ([place])   . DVT (deep venous thrombosis) ([facility])    left leg.  [name] GERD (gastroesophageal reflux disease)   . Hypercholesterolemia   . Hypertension   . Prostate cancer ([place]) 12/06/13   gleason 4+3=7, 6/12 cores positive. seed implant and radiation    MEDICATIONS:  Prior to Admission medications   Medication Sig Start Date End Date Taking? Authorizing Provider  amLODipine (NORVASC) 2.5 MG tablet Take 1 tablet (2.5 mg total) by mouth daily. 02/24/19   [name], MD  atorvastatin (LIPITOR) 40 MG tablet TAKE ONE TABLET BY MOUTH ONCE DAILY. 02/24/19   [name], MD  blood glucose meter kit and supplies KIT Test blood sugar daily as directed. Dx code: E11.9 01/23/15   Darlyne Russian, MD  calcium-vitamin D (OSCAL WITH D) 250-125 MG-UNIT tablet Take 1 tablet by mouth daily.     [provider]  Canagliflozin-metFORMIN HCl ER (INVOKAMET XR) [phone] MG TB24 Take 2 tablets by mouth daily. 05/12/19   Elayne Snare, MD  CINNAMON PO Take 1,000 mg by mouth daily.    [provider]  Coral Calcium 1000 (390 Ca) MG TABS Take 1,000 mg by mouth daily.    [provider]  glimepiride (AMARYL) 1 MG tablet Take 1 tablet (1 mg total) by mouth daily before supper. 02/24/19   [name], MD  glucose blood (ACCU-CHEK GUIDE) test strip Use as instructed to check blood sugar twice daily. 10/29/18   Elayne Snare, MD  leuprolide (LUPRON) 11.25 MG injection Inject 11.25 mg into the muscle every 3 (three) months.    [provider]  losartan (COZAAR) 50 MG tablet Take 1 tablet (50 mg total) by mouth daily. 05/16/19   [name], MD  metoprolol tartrate (LOPRESSOR) 100 MG tablet TAKE 1 TABLET BY MOUTH TWICE A DAY 01/21/19   Sherran Needs, NP  OZEMPIC, 1 MG/DOSE, 2 MG/1.5ML SOPN INJECT 1MG UNDER THE SKIN ONCE WEEKLY. 01/23/19   Elayne Snare, MD  pantoprazole (PROTONIX) 40 MG tablet Take 1 tablet (40 mg total) by mouth daily. 02/24/19   [name], MD  rivaroxaban (XARELTO) 20 MG TABS tablet Take 1 tablet (20 mg total) by mouth daily with supper. 02/24/19   [name], MD  SYNJARDY 12.12-998 MG TABS TAKE 2 TABLETS BY MOUTH EVERY DAY 02/10/19   Elayne Snare, MD  Turmeric 450  MG CAPS Take by mouth.    [provider]  VASCEPA 1 g CAPS TAKE 2 CAPSULES BY MOUTH TWICE A DAY 04/03/19   Elayne Snare, MD  Vitamin D, Cholecalciferol, 400 UNITS TABS Take 400 Units by mouth daily.    [provider]    ALLERGIES:  Allergies  Allergen Reactions  . Lupron [Leuprolide] Swelling and Other (See Comments)    Put in A-fib    SOCIAL HISTORY:  Social History   Tobacco Use  . Smoking status: Former Smoker    Packs/day: 0.50    Years: 23.00    Pack years: 11.50    Types: Cigarettes    Quit date: 08/01/2012    Years since quitting: 6.8  . Smokeless  tobacco: Never Used  Substance Use Topics  . Alcohol use: Yes    Alcohol/week: 0.0 standard drinks    Comment: very seldom, maybe 3x per year    FAMILY HISTORY: Family History  Problem Relation Age of Onset  . Heart disease Mother        before age 45  . Diabetes Mother   . Hyperlipidemia Mother   . Varicose Veins Mother   . Hypertension Mother   . Heart disease Father   . Diabetes Father   . Hyperlipidemia Father   . Hypertension Father   . Cancer Brother   . COPD Brother   . Diabetes Brother   . Hyperlipidemia Brother   . Hypertension Brother     EXAM: BP (!) 186/107 (BP Location: Left Arm)   Pulse (!) 136   Temp 98.3 F (36.8 C) (Oral)   Resp (!) 23   SpO2 95%  CONSTITUTIONAL: Alert and oriented and responds appropriately to questions.  Obese, in no distress HEAD: Normocephalic EYES: Conjunctivae clear, pupils appear equal, EOMI ENT: normal nose; moist mucous membranes NECK: Supple, no meningismus, no nuchal rigidity, no LAD  CARD: Irregularly irregular and tachycardic; S1 and S2 appreciated; no murmurs, no clicks, no rubs, no gallops RESP: Normal chest excursion without splinting or tachypnea; breath sounds clear and equal bilaterally; no wheezes, no rhonchi, no rales, no hypoxia or respiratory distress, speaking full sentences ABD/GI: Normal bowel sounds; non-distended; soft, non-tender, no rebound, no guarding, no peritoneal signs, no hepatosplenomegaly BACK:  The back appears normal and is non-tender to palpation, there is no CVA tenderness EXT: Normal ROM in all joints; non-tender to palpation; no edema; normal capillary refill; no cyanosis, no calf tenderness or swelling    SKIN: Normal color for age and race; warm; no rash NEURO: Moves all extremities equally PSYCH: The patient's mood and manner are appropriate. Grooming and personal hygiene are appropriate.  MEDICAL DECISION MAKING: Patient here with complaints of chest pain and shortness of breath likely  related to his A. fib with RVR.  Some improvement with rest and IV fluids but will start diltiazem given he is still in the 130s and symptomatic.  Will obtain labs including troponin and electrolytes.  Will obtain chest x-ray.  ED PROGRESS: Patient's heart rate is improving and is in the 110 is to 120s on 5 mg of IV diltiazem.  He states he is feeling better his chest pressure has improved.  His troponin is negative.  His electrolytes are normal.  He does have a leukocytosis which is likely reactive.  He denies any recent fevers, cough, vomiting or diarrhea.  I recommended admission for chest pain rule out and continued monitoring of his A. fib with RVR.  Patient comfortable with  plan.  1:10 AM Discussed patient's case with hospitalist, Dr. Josephine Cables.  I have recommended admission and patient (and family if present) agree with this plan. Admitting physician will place admission orders.   I reviewed all nursing notes, vitals, pertinent previous records, EKGs, lab and urine results, imaging (as available).    EKG Interpretation  Date/Time:  Sunday May 22 2019 22:51:45 EDT Ventricular Rate:  152 PR Interval:    QRS Duration: 84 QT Interval:  295 QTC Calculation: 470 R Axis:   90 Text Interpretation:  Atrial fibrillation with RVR Borderline right axis deviation Abnormal R-wave progression, late transition Baseline wander in lead(s) V2 V3 Confirmed by Pryor Curia [phone]) on 05/23/2019 12:43:39 AM        CRITICAL CARE Performed by: Cyril Mourning Sylvester Salonga   Total critical care time: 45 minutes  Critical care time was exclusive of separately billable procedures and treating other patients.  Critical care was necessary to treat or prevent imminent or life-threatening deterioration.  Critical care was time spent personally by me on the following activities: development of treatment plan with patient and/or surrogate as well as nursing, discussions with consultants, evaluation of patient's response  to treatment, examination of patient, obtaining history from patient or surrogate, ordering and performing treatments and interventions, ordering and review of laboratory studies, ordering and review of radiographic studies, pulse oximetry and re-evaluation of patient's condition.     Mercadez Heitman, Delice Bison, DO 05/23/19 [phone]

## 2019-05-22 NOTE — ED Triage Notes (Signed)
Pt reports that he got his flu shot today and has felt nausea and chest pressure since. Pt reports that when he received the shingles vaccination, he went in to a fib. Pt is a fib on the monitor (40s-150s). A&Ox4. Ambulatory.

## 2019-05-23 DIAGNOSIS — E782 Mixed hyperlipidemia: Secondary | ICD-10-CM | POA: Diagnosis not present

## 2019-05-23 DIAGNOSIS — I4891 Unspecified atrial fibrillation: Secondary | ICD-10-CM

## 2019-05-23 DIAGNOSIS — C61 Malignant neoplasm of prostate: Secondary | ICD-10-CM

## 2019-05-23 DIAGNOSIS — I480 Paroxysmal atrial fibrillation: Secondary | ICD-10-CM | POA: Diagnosis present

## 2019-05-23 DIAGNOSIS — E1165 Type 2 diabetes mellitus with hyperglycemia: Secondary | ICD-10-CM

## 2019-05-23 LAB — GLUCOSE, CAPILLARY
Glucose-Capillary: 142 mg/dL — ABNORMAL HIGH (ref 70–99)
Glucose-Capillary: 119 mg/dL — ABNORMAL HIGH (ref 70–99)
Glucose-Capillary: 110 mg/dL — ABNORMAL HIGH (ref 70–99)

## 2019-05-23 LAB — COMPREHENSIVE METABOLIC PANEL
ALT: 13 U/L (ref 0–44)
AST: 13 U/L — ABNORMAL LOW (ref 15–41)
Albumin: 3.9 g/dL (ref 3.5–5.0)
Alkaline Phosphatase: 80 U/L (ref 38–126)
Anion gap: 10 (ref 5–15)
BUN: 27 mg/dL — ABNORMAL HIGH (ref 8–23)
CO2: 24 mmol/L (ref 22–32)
Calcium: 9.2 mg/dL (ref 8.9–10.3)
Chloride: 108 mmol/L (ref 98–111)
Creatinine, Ser: 1.11 mg/dL (ref 0.61–1.24)
GFR calc Af Amer: >60 mL/min (ref >60)
GFR calc non Af Amer: >60 mL/min (ref >60)
Glucose, Bld: 152 mg/dL — ABNORMAL HIGH (ref 70–99)
Potassium: 4.6 mmol/L (ref 3.5–5.1)
Sodium: 142 mmol/L (ref 135–145)
Total Bilirubin: 0.8 mg/dL (ref 0.3–1.2)
Total Protein: 7.1 g/dL (ref 6.5–8.1)

## 2019-05-23 LAB — BASIC METABOLIC PANEL
Anion gap: 10 (ref 5–15)
BUN: 26 mg/dL — ABNORMAL HIGH (ref 8–23)
CO2: 27 mmol/L (ref 22–32)
Calcium: 9.7 mg/dL (ref 8.9–10.3)
Chloride: 105 mmol/L (ref 98–111)
Creatinine, Ser: 1.34 mg/dL — ABNORMAL HIGH (ref 0.61–1.24)
GFR calc Af Amer: >60 mL/min (ref >60)
GFR calc non Af Amer: 55 mL/min — ABNORMAL LOW (ref >60)
Glucose, Bld: 163 mg/dL — ABNORMAL HIGH (ref 70–99)
Potassium: 4.5 mmol/L (ref 3.5–5.1)
Sodium: 142 mmol/L (ref 135–145)

## 2019-05-23 LAB — TROPONIN I (HIGH SENSITIVITY)
Troponin I (High Sensitivity): 3 ng/L (ref <18)
Troponin I (High Sensitivity): 3 ng/L (ref <18)

## 2019-05-23 LAB — PHOSPHORUS: Phosphorus: 4.3 mg/dL (ref 2.5–4.6)

## 2019-05-23 LAB — CBC
HCT: 45.1 % (ref 39.0–52.0)
Hemoglobin: 14.2 g/dL (ref 13.0–17.0)
MCH: 27.4 pg (ref 26.0–34.0)
MCHC: 31.5 g/dL (ref 30.0–36.0)
MCV: 87.1 fL (ref 80.0–100.0)
Platelets: 210 10*3/uL (ref 150–400)
RBC: 5.18 MIL/uL (ref 4.22–5.81)
RDW: 15.2 % (ref 11.5–15.5)
WBC: 11.4 10*3/uL — ABNORMAL HIGH (ref 4.0–10.5)
nRBC: 0 % (ref 0.0–0.2)

## 2019-05-23 LAB — MAGNESIUM
Magnesium: 2.3 mg/dL (ref 1.7–2.4)
Magnesium: 2.1 mg/dL (ref 1.7–2.4)

## 2019-05-23 LAB — HIV ANTIBODY (ROUTINE TESTING W REFLEX): HIV Screen 4th Generation wRfx: NONREACTIVE

## 2019-05-23 LAB — TSH: TSH: 0.77 u[IU]/mL (ref 0.350–4.500)

## 2019-05-23 LAB — SARS CORONAVIRUS 2 (TAT 6-24 HRS): SARS Coronavirus 2: NEGATIVE

## 2019-05-23 MED ORDER — PANTOPRAZOLE SODIUM 40 MG PO TBEC
40.0000 mg | DELAYED_RELEASE_TABLET | Freq: Every day | ORAL | Status: DC
  Administered 2019-05-23: 40 mg via ORAL
  Filled 2019-05-23: qty 1

## 2019-05-23 MED ORDER — METOPROLOL TARTRATE 50 MG PO TABS
100.0000 mg | ORAL_TABLET | Freq: Two times a day (BID) | ORAL | Status: DC
  Administered 2019-05-23: 100 mg via ORAL
  Filled 2019-05-23: qty 2

## 2019-05-23 MED ORDER — INSULIN ASPART 100 UNIT/ML ~~LOC~~ SOLN
0.0000 [IU] | Freq: Three times a day (TID) | SUBCUTANEOUS | Status: DC
  Filled 2019-05-23: qty 0.15

## 2019-05-23 MED ORDER — RIVAROXABAN 20 MG PO TABS
20.0000 mg | ORAL_TABLET | Freq: Every day | ORAL | Status: DC
  Administered 2019-05-23: 20 mg via ORAL
  Filled 2019-05-23: qty 1

## 2019-05-23 MED ORDER — INSULIN ASPART 100 UNIT/ML ~~LOC~~ SOLN
0.0000 [IU] | Freq: Every day | SUBCUTANEOUS | Status: DC
  Filled 2019-05-23: qty 0.05

## 2019-05-23 NOTE — Discharge Summary (Signed)
Tommy Yang XHB:[id number] DOB: April 19, 1951 DOA: 05/22/2019  PCP: [name], MD  Admit date: 05/22/2019 Discharge date: 05/23/2019  Admitted From: Home Disposition: Home  Recommendations for Outpatient Follow-up:  1. Follow up with PCP in 1-2 weeks   Home Health: No Equipment/Devices: None  Discharge Condition: Stable CODE STATUS: Full Diet recommendation: Heart Healthy / Carb Modified   Brief/Interim Summary: History of present illness:  Tommy Yang is a 68 y.o. year old male with medical history significant for atrial fibrillation on Xarelto HTN, HLD, type 2 diabetes, previous left lower extremity DVT, obesity who presented on 05/22/2019 with chest tightness and palpitations shortly after receiving flu vaccine and was found to have atrial fibrillation with rapid ventricular rate.  Reports having chest pressure like "someone is sitting on my chest" yesterday evening which is not usual for him.  He is very active at baseline denies any chest pain typically with exertion.  Of note patient received a flu vaccine earlier that day.  He also started to notice palpitations once he presented to the ED.  Denied any diaphoresis, shortness of breath, nausea or vomiting.  Of note patient reports going into A. fib with RVR after shingles vaccine in the past  ED course T-max 98.3, RR 23, HR 106ED course, BP 164/92  Troponin III, COVID test negative WBC 13.5, hemoglobin 15.3, platelets 228 Sodium 142, glucose 163, BUN 26, creatinine 1.34 Mag 2.3  Chest x-ray borderline mild cardiomegaly, no acute pulmonary process.  Patient was started on diltiazem infusion in ED, [facility] was called for observationRemaining hospital course addressed in problem based format below:   Hospital Course:   Chest tightness, resolved.  EKG consistent with A. fib with RVR on admission.  Likely chest tightness associated with palpitations from A. fib with RVR.  Discomfort resolved with  initiation of diltiazem infusion before transitioning back to home metoprolol dose before discharge.  High-sensitivity troponin remain negative x2.  Atrial fibrillation with RVR, resolved.  Has had prior history of flare after shingles vaccine, this could be related to recent flu vaccine?  Patient was briefly monitored on Cardizem drip and was able to transition off and maintain heart rate less than 110 with reinitiation of home Toprol 100 mg twice daily.  Cardiomegaly noted on transitory but no pulmonary edema, no peripheral edema, no signs of volume overload on exam.  No plan to change blood pressure medications.  Close follow-up with PCP   Consultations:  None  Procedures/Studies: None Subjective: Has no chest pain since last night Feels breathing is great Hopes to go home today Discharge Exam: Vitals:   05/23/19 1415 05/23/19 1428  BP: 140/79   Pulse: 70 90  Resp:    Temp:    SpO2:     Vitals:   05/23/19 1114 05/23/19 1206 05/23/19 1415 05/23/19 1428  BP: 124/70  140/79   Pulse: 76 91 70 90  Resp: 18     Temp: 97.7 F (36.5 C)     TempSrc:      SpO2: 99%     Weight:      Height:        General: Lying in bedside chair, no apparent distress Eyes: EOMI, anicteric ENT: Oral Mucosa clear and moist Cardiovascular: Normal rate, irregularly irregular rhythm, no murmurs, rubs or gallops, no edema, Respiratory: Normal respiratory effort on room air, lungs clear to auscultation bilaterally Abdomen: soft, non-distended, non-tender, normal bowel sounds Skin: No Rash Neurologic: Grossly no focal neuro deficit.Mental status  AAOx3, speech normal, Psychiatric:Appropriate affect, and mood  Discharge Diagnoses:  Active Problems:   Type II diabetes mellitus, uncontrolled ([facility])   Prostate cancer ([place])   Hyperlipidemia   Morbid obesity ([facility])   Paroxysmal atrial fibrillation with RVR ([facility])   Paroxysmal atrial fibrillation [facility])    Discharge Instructions  Discharge  Instructions    Diet - low sodium heart healthy   Complete by: As directed    Increase activity slowly   Complete by: As directed      Allergies as of 05/23/2019      Reactions   Lupron [leuprolide] Swelling, Other (See Comments)   Put in A-fib      Medication List    TAKE these medications   amLODipine 2.5 MG tablet Commonly known as: NORVASC Take 1 tablet (2.5 mg total) by mouth daily.   atorvastatin 40 MG tablet Commonly known as: LIPITOR TAKE ONE TABLET BY MOUTH ONCE DAILY. What changed:   how much to take  how to take this  when to take this  additional instructions   blood glucose meter kit and supplies Kit Test blood sugar daily as directed. Dx code: E11.9   CINNAMON PO Take 1,000 mg by mouth daily.   Coral Calcium 1000 (390 Ca) MG Tabs Take 1,000 mg by mouth daily.   glimepiride 1 MG tablet Commonly known as: Amaryl Take 1 tablet (1 mg total) by mouth daily before supper.   glucose blood test strip Commonly known as: Accu-Chek Guide Use as instructed to check blood sugar twice daily.   Invokamet XR [id number] MG Tb24 Generic drug: Canagliflozin-metFORMIN HCl ER Take 2 tablets by mouth daily.   leuprolide 11.25 MG injection Commonly known as: LUPRON Inject 11.25 mg into the muscle every 6 (six) months.   losartan 50 MG tablet Commonly known as: COZAAR Take 1 tablet (50 mg total) by mouth daily.   metoprolol tartrate 100 MG tablet Commonly known as: LOPRESSOR TAKE 1 TABLET BY MOUTH TWICE A DAY   Ozempic (1 MG/DOSE) 2 MG/1.5ML Sopn Generic drug: Semaglutide (1 MG/DOSE) INJECT 1MG UNDER THE SKIN ONCE WEEKLY. What changed: See the new instructions.   pantoprazole 40 MG tablet Commonly known as: PROTONIX Take 1 tablet (40 mg total) by mouth daily.   rivaroxaban 20 MG Tabs tablet Commonly known as: Xarelto Take 1 tablet (20 mg total) by mouth daily with supper. What changed: when to take this   Synjardy 12.12-998 MG Tabs Generic drug:  Empagliflozin-metFORMIN HCl TAKE 2 TABLETS BY MOUTH EVERY DAY What changed: when to take this   Turmeric 450 MG Caps Take 450 mg by mouth daily after breakfast.   Vascepa 1 g Caps Generic drug: Icosapent Ethyl TAKE 2 CAPSULES BY MOUTH TWICE A DAY What changed: how much to take   Vitamin D (Cholecalciferol) 10 MCG (400 UNIT) Tabs Take 400 Units by mouth daily.       Allergies  Allergen Reactions  . Lupron [Leuprolide] Swelling and Other (See Comments)    Put in A-fib        The results of significant diagnostics from this hospitalization (including imaging, microbiology, ancillary and laboratory) are listed below for reference.     Microbiology: Recent Results (from the past 240 hour(s))  SARS CORONAVIRUS 2 (TAT 6-24 HRS) Nasopharyngeal Nasopharyngeal Swab     Status: None   Collection Time: 05/23/19  1:10 AM   Specimen: Nasopharyngeal Swab  Result Value Ref Range Status   SARS Coronavirus 2 NEGATIVE NEGATIVE Final  Comment: (NOTE) SARS-CoV-2 target nucleic acids are NOT DETECTED. The SARS-CoV-2 RNA is generally detectable in upper and lower respiratory specimens during the acute phase of infection. Negative results do not preclude SARS-CoV-2 infection, do not rule out co-infections with other pathogens, and should not be used as the sole basis for treatment or other patient management decisions. Negative results must be combined with clinical observations, patient history, and epidemiological information. The expected result is Negative. Fact Sheet for Patients: SugarRoll.be Fact Sheet for Healthcare Providers: https://www.woods-mathews.com/ This test is not yet approved or cleared by the [place] FDA and  has been authorized for detection and/or diagnosis of SARS-CoV-2 by FDA under an Emergency Use Authorization (EUA). This EUA will remain  in effect (meaning this test can be used) for the duration of the COVID-19  declaration under Section 56 4(b)(1) of the Act, 21 U.S.C. section 360bbb-3(b)(1), unless the authorization is terminated or revoked sooner. Performed at [facility], [place] [address] [place], [place] [address]      Labs: BNP (last 3 results) No results for input(s): BNP in the last 8760 hours. Basic Metabolic Panel: Recent Labs  Lab 05/22/19 2317 05/23/19 0513  NA 142 142  K 4.5 4.6  CL 105 108  CO2 27 24  GLUCOSE 163* 152*  BUN 26* 27*  CREATININE 1.34* 1.11  CALCIUM 9.7 9.2  MG 2.3 2.1  PHOS  --  4.3   Liver Function Tests: Recent Labs  Lab 05/23/19 0513  AST 13*  ALT 13  ALKPHOS 80  BILITOT 0.8  PROT 7.1  ALBUMIN 3.9   No results for input(s): LIPASE, AMYLASE in the last 168 hours. No results for input(s): AMMONIA in the last 168 hours. CBC: Recent Labs  Lab 05/22/19 2317 05/23/19 0513  WBC 13.5* 11.4*  HGB 15.3 14.2  HCT 47.7 45.1  MCV 86.6 87.1  PLT 228 210   Cardiac Enzymes: No results for input(s): CKTOTAL, CKMB, CKMBINDEX, TROPONINI in the last 168 hours. BNP: Invalid input(s): POCBNP CBG: Recent Labs  Lab 05/23/19 0406 05/23/19 0800 05/23/19 1136  GLUCAP 142* 119* 110*   D-Dimer No results for input(s): DDIMER in the last 72 hours. Hgb A1c No results for input(s): HGBA1C in the last 72 hours. Lipid Profile No results for input(s): CHOL, HDL, LDLCALC, TRIG, CHOLHDL, LDLDIRECT in the last 72 hours. Thyroid function studies Recent Labs    05/23/19 1203  TSH 0.770   Anemia work up No results for input(s): VITAMINB12, FOLATE, FERRITIN, TIBC, IRON, RETICCTPCT in the last 72 hours. Urinalysis    Component Value Date/Time   BILIRUBINUR neg 12/14/2013 0810   PROTEINUR 100 12/14/2013 0810   UROBILINOGEN 0.2 12/14/2013 0810   NITRITE neg 12/14/2013 0810   LEUKOCYTESUR Negative 12/14/2013 0810   Sepsis Labs Invalid input(s): PROCALCITONIN,  WBC,  LACTICIDVEN Microbiology Recent Results (from the past 240 hour(s))  SARS  CORONAVIRUS 2 (TAT 6-24 HRS) Nasopharyngeal Nasopharyngeal Swab     Status: None   Collection Time: 05/23/19  1:10 AM   Specimen: Nasopharyngeal Swab  Result Value Ref Range Status   SARS Coronavirus 2 NEGATIVE NEGATIVE Final    Comment: (NOTE) SARS-CoV-2 target nucleic acids are NOT DETECTED. The SARS-CoV-2 RNA is generally detectable in upper and lower respiratory specimens during the acute phase of infection. Negative results do not preclude SARS-CoV-2 infection, do not rule out co-infections with other pathogens, and should not be used as the sole basis for treatment or other patient management decisions. Negative  results must be combined with clinical observations, patient history, and epidemiological information. The expected result is Negative. Fact Sheet for Patients: SugarRoll.be Fact Sheet for Healthcare Providers: https://www.woods-mathews.com/ This test is not yet approved or cleared by the [place] FDA and  has been authorized for detection and/or diagnosis of SARS-CoV-2 by FDA under an Emergency Use Authorization (EUA). This EUA will remain  in effect (meaning this test can be used) for the duration of the COVID-19 declaration under Section 56 4(b)(1) of the Act, 21 U.S.C. section 360bbb-3(b)(1), unless the authorization is terminated or revoked sooner. Performed at [facility], [place] [address] [place], [place] [address]      Time coordinating discharge: Over 30 minutes  SIGNED:   Desiree Hane, MD  [facility] 05/23/2019, 2:42 PM Pager   If 7PM-7AM, please contact night-coverage [email or web address] Password [email or web address]

## 2019-05-23 NOTE — ED Notes (Signed)
ED TO INPATIENT HANDOFF REPORT  Name/Age/Gender Tommy Yang 68 y.o. male  Code Status    Code Status Orders  (From admission, onward)         Start     Ordered   05/23/19 0254  Full code  Continuous     05/23/19 0254        Code Status History    Date Active Date Inactive Code Status Order ID Comments User Context   05/23/2019 0157 05/23/2019 0254 Full Code [id number]  Tommy Hoit, DO ED   Advance Care Planning Activity      Home/SNF/Other Home  Chief Complaint shob/ nausea; had flu shot today  Level of Care/Admitting Diagnosis ED Disposition    ED Disposition Condition [place]: [facility] [id number]  Level of Care: Telemetry [5]  Admit to tele based on following criteria: Complex arrhythmia (Bradycardia/Tachycardia)  Covid Evaluation: N/A  Diagnosis: Paroxysmal atrial fibrillation [facility]) [id number]  Admitting Physician: Tommy Yang [id number]  Attending Physician: Tommy Yang [id number]  PT Class (Do Not Modify): Observation [104]  PT Acc Code (Do Not Modify): Observation [10022]       Medical History Past Medical History:  Diagnosis Date  . Atrial fibrillation ([place])   . Diabetes mellitus without complication ([place])   . DVT (deep venous thrombosis) (HCC)    left leg.  [name] GERD (gastroesophageal reflux disease)   . Hypercholesterolemia   . Hypertension   . Prostate cancer ([place]) 12/06/13   gleason 4+3=7, 6/12 cores positive. seed implant and radiation    Allergies Allergies  Allergen Reactions  . Lupron [Leuprolide] Swelling and Other (See Comments)    Put in A-fib    IV Location/Drains/Wounds Patient Lines/Drains/Airways Status   Active Line/Drains/Airways    Name:   Placement date:   Placement time:   Site:   Days:   Peripheral IV 05/22/19 Forearm   05/22/19    2302    Forearm   1   Airway   08/02/15    0913     1390          Labs/Imaging Results for orders placed or performed during  the hospital encounter of 05/22/19 (from the past 48 hour(s))  CBC     Status: Abnormal   Collection Time: 05/22/19 11:17 PM  Result Value Ref Range   WBC 13.5 (H) 4.0 - 10.5 K/uL   RBC 5.51 4.22 - 5.81 MIL/uL   Hemoglobin 15.3 13.0 - 17.0 g/dL   HCT 47.7 39.0 - 52.0 %   MCV 86.6 80.0 - 100.0 fL   MCH 27.8 26.0 - 34.0 pg   MCHC 32.1 30.0 - 36.0 g/dL   RDW 15.1 11.5 - 15.5 %   Platelets 228 150 - 400 K/uL   nRBC 0.0 0.0 - 0.2 %    Comment: Performed at [facility], [place] [address], [place], [place] [id number]  Basic metabolic panel     Status: Abnormal   Collection Time: 05/22/19 11:17 PM  Result Value Ref Range   Sodium 142 135 - 145 mmol/L   Potassium 4.5 3.5 - 5.1 mmol/L   Chloride 105 98 - 111 mmol/L   CO2 27 22 - 32 mmol/L   Glucose, Bld 163 (H) 70 - 99 mg/dL   BUN 26 (H) 8 - 23 mg/dL   Creatinine, Ser 1.34 (H) 0.61 - 1.24 mg/dL   Calcium 9.7 8.9 - 10.3 mg/dL   GFR calc non Af  Amer 55 (L) >60 mL/min   GFR calc Af Amer >60 >60 mL/min   Anion gap 10 5 - 15    Comment: Performed at [facility], [place] [address], [place], [place] [address]  Magnesium     Status: None   Collection Time: 05/22/19 11:17 PM  Result Value Ref Range   Magnesium 2.3 1.7 - 2.4 mg/dL    Comment: Performed at [facility], [place] [address], [place], [place] [address]  Troponin I (High Sensitivity)     Status: None   Collection Time: 05/22/19 11:17 PM  Result Value Ref Range   Troponin I (High Sensitivity) 3 <18 ng/L    Comment: (NOTE) Elevated high sensitivity troponin I (hsTnI) values and significant  changes across serial measurements may suggest ACS but many other  chronic and acute conditions are known to elevate hsTnI results.  Refer to the "Links" section for chest pain algorithms and additional  guidance. Performed at [facility], [place] [address] [place], [place] [address]    Dg Chest Portable 1 View  Result Date:  05/22/2019 CLINICAL DATA:  Chest pain and shortness of breath. Patient reports nausea and chest pressure after receiving flu shot today. EXAM: PORTABLE CHEST 1 VIEW COMPARISON:  Radiograph 08/29/2016, CT 07/14/2016 FINDINGS: Borderline mild cardiomegaly.The cardiomediastinal contours are normal. The lungs are clear. Pulmonary vasculature is normal. No consolidation, pleural effusion, or pneumothorax. No acute osseous abnormalities are seen. IMPRESSION: Borderline mild cardiomegaly.  No acute pulmonary process. Electronically Signed   By: Keith Rake M.D.   On: 05/22/2019 23:41    Pending Labs Unresulted Labs (From admission, onward)    Start     Ordered   05/23/19 0500  Comprehensive metabolic panel  Tomorrow morning,   R     05/23/19 0157   05/23/19 0500  CBC  Tomorrow morning,   R     05/23/19 0157   05/23/19 0500  Magnesium  Once,   STAT     05/23/19 0157   05/23/19 0500  Phosphorus  Once,   STAT     05/23/19 0157   05/23/19 0151  HIV antibody (Routine Testing)  Once,   STAT     05/23/19 0157   05/23/19 0110  SARS CORONAVIRUS 2 (TAT 6-24 HRS) Nasopharyngeal Nasopharyngeal Swab  (Asymptomatic/Tier 2 Patients Labs)  ONCE - STAT,   STAT    Question Answer Comment  Is this test for diagnosis or screening Screening   Symptomatic for COVID-19 as defined by CDC No   Hospitalized for COVID-19 No   Admitted to ICU for COVID-19 No   Previously tested for COVID-19 No   Resident in a congregate (group) care setting No   Employed in healthcare setting No      05/23/19 0109          Vitals/Pain Today's Vitals   05/23/19 0045 05/23/19 0145 05/23/19 0230 05/23/19 0315  BP: (!) 147/89 113/70 (!) 145/70 (!) 143/105  Pulse: (!) 106 70 98 (!) 108  Resp: 13     Temp:      TempSrc:      SpO2: 93% 93% 93% 95%  PainSc:        Isolation Precautions No active isolations  Medications Medications  diltiazem (CARDIZEM) 1 mg/mL load via infusion 10 mg (10 mg Intravenous Bolus from Bag  05/22/19 2352)    And  diltiazem (CARDIZEM) 100 mg in dextrose 5 % 100 mL (1 mg/mL) infusion (5 mg/hr  Intravenous Transfusing/Transfer 05/23/19 0334)  insulin aspart (novoLOG) injection 0-15 Units (has no administration in time range)  insulin aspart (novoLOG) injection 0-5 Units (has no administration in time range)    Mobility walks

## 2019-05-23 NOTE — H&P (Addendum)
History and Physical  Tommy Yang SHF:[id number] DOB: 01-Jun-1951 DOA: 05/22/2019  Referring physician: Dr. Pryor Curia PCP: [name], MD  Patient coming from: Home   Chief Complaint: "Sensation of heaviness in my chest"  HPI: Tommy Yang is a 68 y.o. male with medical history significant for atrial fibrillation on metoprolol Xarelto, hypertension, hyperlipidemia, type 2 diabetes mellitus, previous left lower extremity DVT and obesity who presents to the emergency department with complaints of chest pressure described as " someone is sitting on my chest" which started around 6 PM-7 PM last evening while at rest.  Patient states that he had a flu shot earlier in the afternoon and the chest pressure started within 1 to 2 hours later.  Patient said that he had similar episode when he had shingles vaccine.  He also complained of nausea and shortness of breath with exertion, but denies diaphoresis, headache, fever, chills, dizziness, abdominal pain or vomiting.  ED Course:  In the emergency department, patient was noted to have paroxysmal A. Fib.BP was 186/107, he was tachycardic with the HR of 145 and tachypneic.  Temperature 98.3 and O2 sat 92% on room air.  Work-up in the ED showed leukocytosis, BUN/creatinine 26/1.34 and blood glucose around 63. Troponin I x 1 was 3. Chest x-ray showed borderline mild cardiomegaly with no acute pulmonary process.  He was started on IV Cardizem drip with improved and resolved chest discomfort.  It was decided for patient to be observed overnight with plan to transition patient back to home oral meds prior to discharge.   Review of Systems: Constitutional: Negative for chills and fever.  HENT: Negative for ear pain and sore throat.   Eyes: Negative for pain and visual disturbance.  Respiratory: Short of breath on exertion.  Negative for cough.  Cardiovascular: Positive for chest pain negative for palpitations.  Gastrointestinal: Negative for  abdominal pain and vomiting.  Endocrine: Negative for polyphagia and polyuria.  Genitourinary: Negative for decreased urine volume, dysuria, enuresis, hematuria, vaginal discharge and vaginal pain.  Musculoskeletal: Negative for arthralgias and back pain.  Skin: Negative for color change and rash.  Allergic/Immunologic: Negative for immunocompromised state.  Neurological: Negative for tremors, syncope, speech difficulty, weakness, light-headedness and headaches.  Hematological: Does not bruise/bleed easily.    Past Medical History:  Diagnosis Date  . Atrial fibrillation ([place])   . Diabetes mellitus without complication ([place])   . DVT (deep venous thrombosis) (HCC)    left leg.  [name] GERD (gastroesophageal reflux disease)   . Hypercholesterolemia   . Hypertension   . Prostate cancer ([place]) 12/06/13   gleason 4+3=7, 6/12 cores positive. seed implant and radiation   Past Surgical History:  Procedure Laterality Date  . APPENDECTOMY    . BALLOON DILATION N/A 08/02/2015   Procedure: BALLOON DILATION;  Surgeon: [name], MD;  Location: [facility];  Service: Endoscopy;  Laterality: N/A;  . CARDIOVERSION N/A 05/13/2018   Procedure: CARDIOVERSION;  Surgeon: Sanda Klein, MD;  Location: [facility];  Service: Cardiovascular;  Laterality: N/A;  . CARDIOVERSION N/A 05/18/2018   Procedure: CARDIOVERSION;  Surgeon: Buford Dresser, MD;  Location: [facility];  Service: Cardiovascular;  Laterality: N/A;  . COLONOSCOPY WITH PROPOFOL N/A 08/02/2015   Procedure: COLONOSCOPY WITH PROPOFOL w/ APC;  Surgeon: [name], MD;  Location: [facility];  Service: Endoscopy;  Laterality: N/A;  . ESOPHAGOGASTRODUODENOSCOPY (EGD) WITH PROPOFOL N/A 08/02/2015   Procedure: ESOPHAGOGASTRODUODENOSCOPY (EGD) WITH PROPOFOL/ possible dilation.;  Surgeon: [name], MD;  Location:  [facility];  Service: Endoscopy;  Laterality: N/A;  . KNEE SURGERY     right knee orthoscopic  . PROSTATE BIOPSY   12/06/13   Gleason 4+3=7, volume 35 gm  . [place]  . TONSILLECTOMY      Social History:  reports that he quit smoking about 6 years ago. His smoking use included cigarettes. He has a 11.50 pack-year smoking history. He has never used smokeless tobacco. He reports current alcohol use. He reports that he does not use drugs.   Allergies  Allergen Reactions  . Lupron [Leuprolide] Swelling and Other (See Comments)    Put in A-fib    Family History  Problem Relation Age of Onset  . Heart disease Mother        before age 27  . Diabetes Mother   . Hyperlipidemia Mother   . Varicose Veins Mother   . Hypertension Mother   . Heart disease Father   . Diabetes Father   . Hyperlipidemia Father   . Hypertension Father   . Cancer Brother   . COPD Brother   . Diabetes Brother   . Hyperlipidemia Brother   . Hypertension Brother       Prior to Admission medications   Medication Sig Start Date End Date Taking? Authorizing Provider  amLODipine (NORVASC) 2.5 MG tablet Take 1 tablet (2.5 mg total) by mouth daily. 02/24/19  Yes [name], MD  atorvastatin (LIPITOR) 40 MG tablet TAKE ONE TABLET BY MOUTH ONCE DAILY. Patient taking differently: Take 40 mg by mouth daily after breakfast.  02/24/19  Yes [name], MD  blood glucose meter kit and supplies KIT Test blood sugar daily as directed. Dx code: E11.9 01/23/15  Yes Daub, Loura Back, MD  Canagliflozin-metFORMIN HCl ER (INVOKAMET XR) [phone] MG TB24 Take 2 tablets by mouth daily. 05/12/19  Yes Elayne Snare, MD  CINNAMON PO Take 1,000 mg by mouth daily.   Yes [provider]  Coral Calcium 1000 (390 Ca) MG TABS Take 1,000 mg by mouth daily.   Yes [provider]  glimepiride (AMARYL) 1 MG tablet Take 1 tablet (1 mg total) by mouth daily before supper. 02/24/19  Yes [name], MD  glucose blood (ACCU-CHEK GUIDE) test strip Use as instructed to check blood sugar twice daily. 10/29/18   Yes Elayne Snare, MD  leuprolide (LUPRON) 11.25 MG injection Inject 11.25 mg into the muscle every 6 (six) months.    Yes [provider]  losartan (COZAAR) 50 MG tablet Take 1 tablet (50 mg total) by mouth daily. 05/16/19  Yes [name], MD  metoprolol tartrate (LOPRESSOR) 100 MG tablet TAKE 1 TABLET BY MOUTH TWICE A DAY Patient taking differently: Take 100 mg by mouth 2 (two) times daily.  01/21/19  Yes Sherran Needs, NP  OZEMPIC, 1 MG/DOSE, 2 MG/1.5ML SOPN INJECT 1MG UNDER THE SKIN ONCE WEEKLY. Patient taking differently: Inject 1 mg as directed every Friday.  01/23/19  Yes Elayne Snare, MD  pantoprazole (PROTONIX) 40 MG tablet Take 1 tablet (40 mg total) by mouth daily. 02/24/19  Yes [name], MD  rivaroxaban (XARELTO) 20 MG TABS tablet Take 1 tablet (20 mg total) by mouth daily with supper. Patient taking differently: Take 20 mg by mouth daily after breakfast.  02/24/19  Yes [name], MD  SYNJARDY 12.12-998 MG TABS TAKE 2 TABLETS BY MOUTH EVERY DAY Patient taking differently: Take 2 tablets by  mouth daily after breakfast.  02/10/19  Yes Elayne Snare, MD  Turmeric 450 MG CAPS Take 450 mg by mouth daily after breakfast.    Yes [provider]  VASCEPA 1 g CAPS TAKE 2 CAPSULES BY MOUTH TWICE A DAY Patient taking differently: Take 2 g by mouth 2 (two) times daily.  04/03/19  Yes Elayne Snare, MD  Vitamin D, Cholecalciferol, 400 UNITS TABS Take 400 Units by mouth daily.   Yes [provider]    Physical Exam: BP (!) 154/94   Pulse (!) 107   Temp 98.3 F (36.8 C) (Oral)   Resp (!) 21   SpO2 94%   . General: 68 y.o. year-old male well developed well nourished in no acute distress.  Alert and oriented x3.  [name] HEENT: Normocephalic, atraumatic . NECK: Supple, trachea midline, normal range of motion . Cardiovascular: Irregularly irregular heart  rate and rhythm with no rubs or gallops.  No thyromegaly or JVD noted. 2/4 pulses in all 4 extremities. [name] Respiratory:  Clear to auscultation with no wheezes or rales. Good inspiratory effort. . Abdomen: Soft nontender nondistended with normal bowel sounds x4 quadrants. . Muskuloskeletal: No cyanosis, clubbing or edema noted bilaterally . Neuro: CN II-XII intact, strength, sensation, reflexes . Skin: No ulcerative lesions noted or rashes . Psychiatry: Judgement and insight appear normal. Mood is appropriate for condition and setting          Labs on Admission:  Basic Metabolic Panel: Recent Labs  Lab 05/22/19 2317  NA 142  K 4.5  CL 105  CO2 27  GLUCOSE 163*  BUN 26*  CREATININE 1.34*  CALCIUM 9.7  MG 2.3   Liver Function Tests: No results for input(s): AST, ALT, ALKPHOS, BILITOT, PROT, ALBUMIN in the last 168 hours. No results for input(s): LIPASE, AMYLASE in the last 168 hours. No results for input(s): AMMONIA in the last 168 hours. CBC: Recent Labs  Lab 05/22/19 2317  WBC 13.5*  HGB 15.3  HCT 47.7  MCV 86.6  PLT 228   Cardiac Enzymes: No results for input(s): CKTOTAL, CKMB, CKMBINDEX, TROPONINI in the last 168 hours.  BNP (last 3 results) No results for input(s): BNP in the last 8760 hours.  ProBNP (last 3 results) No results for input(s): PROBNP in the last 8760 hours.  CBG: No results for input(s): GLUCAP in the last 168 hours.  Radiological Exams on Admission: Dg Chest Portable 1 View  Result Date: 05/22/2019 CLINICAL DATA:  Chest pain and shortness of breath. Patient reports nausea and chest pressure after receiving flu shot today. EXAM: PORTABLE CHEST 1 VIEW COMPARISON:  Radiograph 08/29/2016, CT 07/14/2016 FINDINGS: Borderline mild cardiomegaly.The cardiomediastinal contours are normal. The lungs are clear. Pulmonary vasculature is normal. No consolidation, pleural effusion, or pneumothorax. No acute osseous abnormalities are seen. IMPRESSION: Borderline mild cardiomegaly.  No acute pulmonary process. Electronically Signed   By: Keith Rake M.D.   On: 05/22/2019 23:41     EKG: I independently viewed the EKG done and my findings are as followed: A. fib with RVR at rate of 152bpm  Assessment/Plan Present on Admission: . Paroxysmal atrial fibrillation with RVR ([place]) . Type II diabetes mellitus, uncontrolled ([place]) . Prostate cancer ([place]) . Hyperlipidemia . Morbid obesity ([place])  Active Problems:   Type II diabetes mellitus, uncontrolled ([place])   Prostate cancer ([place])   Hyperlipidemia   Morbid obesity (HCC)   Paroxysmal atrial fibrillation with RVR (HCC)    Paroxysmal atrial fibrillation with RVR Patient will  be admitted to telemetry, he t was started on IV Cardizem drip, we shall continue with same with plan to transition patient back to home metoprolol and Xarelto once heart rate is better controlled.  Atypical chest pain R/O ACS CHADs-VASc score 4; Heart score 4 Patient complained of midsternal sensation of heaviness in his chest but with no radiation.  This has since resolved. Troponin I x 1 was negative, continue to trend troponin  Hyperglycemia secondary to type 2 diabetes mellitus Continue insulin sliding scale and hypoglycemia protocol  History of prostate cancer  Patient states that he takes Lupron  Hyperlipidemia Continue home meds when med rec is updated  Essential hypertension  Patient is currently on IV Cardizem.  GERD Continue Protonix  Previous left lower extremity DVT Continue Xarelto per home regimen  Obesity Patient was counseled about the cardiovascular and metabolic risk of morbid obesity. Patient was counseled for diet control, exercise regimen and weight loss.   DVT prophylaxis: Xarelto  Code Status: Full  Family Communication: Wife at bedside  Disposition Plan: Plan to discharge patient home once heart rate is better controlled  Consults called: None  Admission status: Observation    [name] MD [facility]  If 7PM-7AM, please contact night-coverage [email or web address]   05/23/2019,  2:09 AM

## 2019-05-23 NOTE — Progress Notes (Signed)
Went over discharge papers with patient.  All questions answered.  VSS.

## 2019-06-14 DIAGNOSIS — N5201 Erectile dysfunction due to arterial insufficiency: Secondary | ICD-10-CM | POA: Diagnosis not present

## 2019-06-14 DIAGNOSIS — C61 Malignant neoplasm of prostate: Secondary | ICD-10-CM | POA: Diagnosis not present

## 2019-06-21 DIAGNOSIS — C61 Malignant neoplasm of prostate: Secondary | ICD-10-CM | POA: Diagnosis not present

## 2019-06-21 DIAGNOSIS — N5201 Erectile dysfunction due to arterial insufficiency: Secondary | ICD-10-CM | POA: Diagnosis not present

## 2019-06-28 ENCOUNTER — Other Ambulatory Visit: Payer: Self-pay | Admitting: Endocrinology

## 2019-06-28 ENCOUNTER — Other Ambulatory Visit: Payer: Self-pay

## 2019-06-28 ENCOUNTER — Telehealth: Payer: Self-pay

## 2019-06-28 MED ORDER — OZEMPIC (1 MG/DOSE) 2 MG/1.5ML ~~LOC~~ SOPN: 1.0000 mg | PEN_INJECTOR | SUBCUTANEOUS | 1 refills | Status: DC

## 2019-06-28 NOTE — Telephone Encounter (Signed)
Rx sent to pharmacy   

## 2019-06-28 NOTE — Telephone Encounter (Signed)
MEDICATION: OZEMPIC, 1 MG/DOSE, 2 MG/1.5ML SOPN  PHARMACY:  [facility]  IS THIS A 90 DAY SUPPLY : yes  IS PATIENT OUT OF MEDICATION: yes  IF NOT; HOW MUCH IS LEFT:   LAST APPOINTMENT DATE: @09 /06/2019  NEXT APPOINTMENT DATE:@Visit  date not found  DO WE HAVE YOUR PERMISSION TO LEAVE A DETAILED MESSAGE:yes  OTHER COMMENTS:    **Let patient know to contact pharmacy at the end of the day to make sure medication is ready. **  ** Please notify patient to allow 48-72 hours to process**  **Encourage patient to contact the pharmacy for refills or they can request refills through [facility]**

## 2019-07-25 DIAGNOSIS — E119 Type 2 diabetes mellitus without complications: Secondary | ICD-10-CM | POA: Diagnosis not present

## 2019-07-25 LAB — HM DIABETES EYE EXAM

## 2019-08-03 ENCOUNTER — Other Ambulatory Visit (HOSPITAL_COMMUNITY): Payer: Self-pay | Admitting: Nurse Practitioner

## 2019-08-03 ENCOUNTER — Encounter: Payer: Self-pay | Admitting: Family Medicine

## 2019-08-03 DIAGNOSIS — I10 Essential (primary) hypertension: Secondary | ICD-10-CM

## 2019-08-03 NOTE — Progress Notes (Unsigned)
No retinopathy

## 2019-08-10 ENCOUNTER — Other Ambulatory Visit: Payer: Self-pay

## 2019-08-10 ENCOUNTER — Encounter: Payer: Self-pay | Admitting: Internal Medicine

## 2019-08-10 ENCOUNTER — Telehealth: Payer: Self-pay

## 2019-08-10 ENCOUNTER — Ambulatory Visit (INDEPENDENT_AMBULATORY_CARE_PROVIDER_SITE_OTHER): Payer: BC Managed Care – PPO | Admitting: Internal Medicine

## 2019-08-10 ENCOUNTER — Other Ambulatory Visit (INDEPENDENT_AMBULATORY_CARE_PROVIDER_SITE_OTHER): Payer: BC Managed Care – PPO

## 2019-08-10 VITALS — BP 132/82 | HR 87 | Temp 97.8°F | Ht 71.0 in | Wt 323.0 lb

## 2019-08-10 DIAGNOSIS — R300 Dysuria: Secondary | ICD-10-CM | POA: Insufficient documentation

## 2019-08-10 DIAGNOSIS — M545 Low back pain, unspecified: Secondary | ICD-10-CM | POA: Insufficient documentation

## 2019-08-10 DIAGNOSIS — R399 Unspecified symptoms and signs involving the genitourinary system: Secondary | ICD-10-CM | POA: Diagnosis not present

## 2019-08-10 DIAGNOSIS — K5900 Constipation, unspecified: Secondary | ICD-10-CM | POA: Insufficient documentation

## 2019-08-10 DIAGNOSIS — E1165 Type 2 diabetes mellitus with hyperglycemia: Secondary | ICD-10-CM

## 2019-08-10 LAB — URINALYSIS, ROUTINE W REFLEX MICROSCOPIC
Bilirubin Urine: NEGATIVE
Ketones, ur: NEGATIVE
Leukocytes,Ua: NEGATIVE
Nitrite: NEGATIVE
Specific Gravity, Urine: <=1.005 — AB (ref 1.000–1.030)
Total Protein, Urine: NEGATIVE
Urine Glucose: >=1000 — AB
Urobilinogen, UA: 0.2 (ref 0.0–1.0)
pH: 6 (ref 5.0–8.0)

## 2019-08-10 MED ORDER — CEFTRIAXONE SODIUM 1 G IJ SOLR
1.0000 g | Freq: Once | INTRAMUSCULAR | Status: AC
  Administered 2019-08-10: 11:00:00 1 g via INTRAMUSCULAR

## 2019-08-10 MED ORDER — CIPROFLOXACIN HCL 500 MG PO TABS: 500.0000 mg | ORAL_TABLET | Freq: Two times a day (BID) | ORAL | 0 refills | Status: DC

## 2019-08-10 NOTE — Telephone Encounter (Signed)
Ok this is done 

## 2019-08-10 NOTE — Assessment & Plan Note (Signed)
Ok for [employer] daily, and mag citrate or dulcolox prn

## 2019-08-10 NOTE — Progress Notes (Signed)
Subjective:    Patient ID: Tommy Yang, male    DOB: 1951-07-09, 68 y.o.   MRN: [id number]  HPI  Here with markely worsening GU symptoms; has been followed by urology with urinary freq with offer of tx of some kind when frequency was about every 90 min but he deferred.  Today c/o 3 days much worsening urinary freq with dysuria about every 20 -30 min, ? Cloudy urine but Denies urinary symptoms such as flank pain, hematuria or n/v, fever, chills. Does also have weak stream and some sense of urinary retention.  Pt denies chest pain, increased sob or doe, wheezing, orthopnea, PND, increased LE swelling, palpitations, dizziness or syncope.  Denies worsening reflux, abd pain, dysphagia, n/v, or blood but has had at same time as dysuria has developed constipation not yet responding to miralax taken last PM.  Also has been straining at stool, now with very mild worsening this am of lowest lumbar pain but Pt denies bowel or bladder change, fever, wt loss,  worsening LE pain/numbness/weakness, gait change or falls. Not taking vascepa due feeling funy in the head and dizziness.   Pt denies polydipsia, polyuria,  Past Medical History:  Diagnosis Date  . Atrial fibrillation ([place])   . Diabetes mellitus without complication ([place])   . DVT (deep venous thrombosis) ([facility])    left leg.  [name] GERD (gastroesophageal reflux disease)   . Hypercholesterolemia   . Hypertension   . Prostate cancer ([place]) 12/06/13   gleason 4+3=7, 6/12 cores positive. seed implant and radiation   Past Surgical History:  Procedure Laterality Date  . APPENDECTOMY    . BALLOON DILATION N/A 08/02/2015   Procedure: BALLOON DILATION;  Surgeon: [name], MD;  Location: [facility];  Service: Endoscopy;  Laterality: N/A;  . CARDIOVERSION N/A 05/13/2018   Procedure: CARDIOVERSION;  Surgeon: Sanda Klein, MD;  Location: [facility];  Service: Cardiovascular;  Laterality: N/A;  . CARDIOVERSION N/A 05/18/2018   Procedure:  CARDIOVERSION;  Surgeon: Buford Dresser, MD;  Location: [facility];  Service: Cardiovascular;  Laterality: N/A;  . COLONOSCOPY WITH PROPOFOL N/A 08/02/2015   Procedure: COLONOSCOPY WITH PROPOFOL w/ APC;  Surgeon: [name], MD;  Location: [facility];  Service: Endoscopy;  Laterality: N/A;  . ESOPHAGOGASTRODUODENOSCOPY (EGD) WITH PROPOFOL N/A 08/02/2015   Procedure: ESOPHAGOGASTRODUODENOSCOPY (EGD) WITH PROPOFOL/ possible dilation.;  Surgeon: [name], MD;  Location: [facility];  Service: Endoscopy;  Laterality: N/A;  . KNEE SURGERY     right knee orthoscopic  . PROSTATE BIOPSY  12/06/13   Gleason 4+3=7, volume 35 gm  . [place]  . TONSILLECTOMY      reports that he quit smoking about 7 years ago. His smoking use included cigarettes. He has a 11.50 pack-year smoking history. He has never used smokeless tobacco. He reports current alcohol use. He reports that he does not use drugs. family history includes COPD in his brother; Cancer in his brother; Diabetes in his brother, father, and mother; Heart disease in his father and mother; Hyperlipidemia in his brother, father, and mother; Hypertension in his brother, father, and mother; Varicose Veins in his mother. No Active Allergies Current Outpatient Medications on File Prior to Visit  Medication Sig Dispense Refill  . amLODipine (NORVASC) 2.5 MG tablet Take 1 tablet (2.5 mg total) by mouth daily. 90 tablet 1  . atorvastatin (LIPITOR) 40 MG tablet TAKE ONE TABLET BY MOUTH ONCE DAILY. (Patient taking differently: Take  40 mg by mouth daily after breakfast. ) 90 tablet 1  . blood glucose meter kit and supplies KIT Test blood sugar daily as directed. Dx code: E11.9 1 each 0  . Canagliflozin-metFORMIN HCl ER (INVOKAMET XR) [phone] MG TB24 Take 2 tablets by mouth daily. 180 tablet 2  . CINNAMON PO Take 1,000 mg by mouth daily.    [name] Coral Calcium 1000 (390 Ca) MG TABS Take 1,000 mg by mouth  daily.    [name] glimepiride (AMARYL) 1 MG tablet Take 1 tablet (1 mg total) by mouth daily before supper. 90 tablet 1  . glucose blood (ACCU-CHEK GUIDE) test strip Use as instructed to check blood sugar twice daily. 100 each 3  . leuprolide (LUPRON) 11.25 MG injection Inject 11.25 mg into the muscle every 6 (six) months.     [name] losartan (COZAAR) 50 MG tablet Take 1 tablet (50 mg total) by mouth daily. 90 tablet 1  . metoprolol tartrate (LOPRESSOR) 100 MG tablet TAKE 1 TABLET BY MOUTH TWICE A DAY 180 tablet 2  . OZEMPIC, 1 MG/DOSE, 2 MG/1.5ML SOPN INJECT 1MG UNDER THE SKIN ONCE WEEKLY. 9 pen 1  . pantoprazole (PROTONIX) 40 MG tablet Take 1 tablet (40 mg total) by mouth daily. 90 tablet 3  . rivaroxaban (XARELTO) 20 MG TABS tablet Take 1 tablet (20 mg total) by mouth daily with supper. (Patient taking differently: Take 20 mg by mouth daily after breakfast. ) 90 tablet 1  . Semaglutide, 1 MG/DOSE, (OZEMPIC, 1 MG/DOSE,) 2 MG/1.5ML SOPN Inject 1 mg as directed every Friday. 6 pen 1  . SYNJARDY 12.12-998 MG TABS TAKE 2 TABLETS BY MOUTH EVERY DAY (Patient taking differently: Take 2 tablets by mouth daily after breakfast. ) 180 tablet 1  . Turmeric 450 MG CAPS Take 450 mg by mouth daily after breakfast.     . Vitamin D, Cholecalciferol, 400 UNITS TABS Take 400 Units by mouth daily.     No current facility-administered medications on file prior to visit.    Review of Systems  Constitutional: Negative for other unusual diaphoresis or sweats HENT: Negative for ear discharge or swelling Eyes: Negative for other worsening visual disturbances Respiratory: Negative for stridor or other swelling  Gastrointestinal: Negative for worsening distension or other blood Genitourinary: Negative for retention or other urinary change Musculoskeletal: Negative for other MSK pain or swelling Skin: Negative for color change or other new lesions Neurological: Negative for worsening tremors and other numbness   Psychiatric/Behavioral: Negative for worsening agitation or other fatigue All otherwise neg per pt     Objective:   Physical Exam BP 132/82   Pulse 87   Temp 97.8 F (36.6 C) (Oral)   Ht '5\' 11"'  (1.803 m)   Wt (!) 323 lb (146.5 kg)   SpO2 96%   BMI 45.05 kg/m  VS noted,  Constitutional: Pt appears in NAD HENT: Head: NCAT.  Right Ear: External ear normal.  Left Ear: External ear normal.  Eyes: . Pupils are equal, round, and reactive to light. Conjunctivae and EOM are normal Nose: without d/c or deformity Neck: Neck supple. Gross normal ROM Cardiovascular: Normal rate and regular rhythm.   Pulmonary/Chest: Effort normal and breath sounds without rales or wheezing.  Abd:  Soft, NT, ND, + BS, no organomegaly Neurological: Pt is alert. At baseline orientation, motor grossly intact Skin: Skin is warm. No rashes, other new lesions, no LE edema Psychiatric: Pt behavior is normal without agitation  All otherwise neg per pt Lab Results  Component Value Date   WBC 11.4 (H) 05/23/2019   HGB 14.2 05/23/2019   HCT 45.1 05/23/2019   PLT 210 05/23/2019   GLUCOSE 152 (H) 05/23/2019   CHOL 144 02/03/2019   TRIG 128.0 02/03/2019   HDL 34.10 (L) 02/03/2019   LDLDIRECT 93.0 12/30/2016   LDLCALC 84 02/03/2019   ALT 13 05/23/2019   AST 13 (L) 05/23/2019   NA 142 05/23/2019   K 4.6 05/23/2019   CL 108 05/23/2019   CREATININE 1.11 05/23/2019   BUN 27 (H) 05/23/2019   CO2 24 05/23/2019   TSH 0.770 05/23/2019   PSA 6.18 (H) 05/06/2013   INR 1.55 (H) 10/19/2013   HGBA1C 7.2 (H) 05/10/2019   MICROALBUR 4.2 (H) 02/03/2019        Assessment & Plan:

## 2019-08-10 NOTE — Assessment & Plan Note (Signed)
stable overall by history and exam, recent data reviewed with pt, and pt to continue medical treatment as before,  to f/u any worsening symptoms or concerns  

## 2019-08-10 NOTE — Assessment & Plan Note (Addendum)
?   UTI vs prostatitis - for rocephin 1 gm IM, to obtain urine studies though he later admits took one dose amoxil last pm; also for flomax daily and cipro asd, to f/u any worsening symptoms or concerns  Note:  Total time for pt hx, exam, review of record with pt in the room, determination of diagnoses and plan for further eval and tx is > 40 min, with over 50% spent in coordination and counseling of patient including the differential dx, tx, further evaluation and other management of dysuria, constipation, and low back pain

## 2019-08-10 NOTE — Patient Instructions (Addendum)
You had the antibiotic shot today  Please take all new medication as prescribed - the flomax for the prostate, and the cipro antibiotic   We will need to follow up on the urine culture  OK to continue the miralax daily, as well as the Magnesium Citrate solution once, and even Dulcolox pill or suppository if needed  Please continue all other medications as before, and refills have been done if requested.  Please have the pharmacy call with any other refills you may need.  Please continue your efforts at being more active, low cholesterol diet, and weight control.  Please keep your appointments with your specialists as you may have planned  OK to cancel the Dec 23 appt, and Please return in 3 months, or sooner if needed

## 2019-08-10 NOTE — Addendum Note (Signed)
Addended by: [name] on: 08/10/2019 05:08 PM   Modules accepted: Orders

## 2019-08-10 NOTE — Telephone Encounter (Signed)
Copied from [place] [id number]. Topic: General - Inquiry >> Aug 10, 2019  2:22 PM Richardo Priest, [place] wrote: Reason for CRM: Pt called in stating he has not yet received the medication for his UTI and he would like it sent to pharmacy asap. Please advise.

## 2019-08-10 NOTE — Assessment & Plan Note (Signed)
C/w mild strain, no specific tx or eval needed for  Now, to f/u any worsening symptoms or concerns

## 2019-08-11 ENCOUNTER — Ambulatory Visit: Payer: Self-pay | Admitting: *Deleted

## 2019-08-11 ENCOUNTER — Other Ambulatory Visit: Payer: Self-pay

## 2019-08-11 ENCOUNTER — Inpatient Hospital Stay (HOSPITAL_COMMUNITY)
Admission: EM | Admit: 2019-08-11 | Discharge: 2019-08-13 | DRG: 690 | Disposition: A | Discharge status: Home / Self Care | Payer: BC Managed Care – PPO | Attending: Family Medicine | Admitting: Family Medicine

## 2019-08-11 ENCOUNTER — Encounter (HOSPITAL_COMMUNITY): Payer: Self-pay | Admitting: Emergency Medicine

## 2019-08-11 ENCOUNTER — Emergency Department (HOSPITAL_COMMUNITY): Payer: BC Managed Care – PPO

## 2019-08-11 DIAGNOSIS — E1165 Type 2 diabetes mellitus with hyperglycemia: Secondary | ICD-10-CM | POA: Diagnosis present

## 2019-08-11 DIAGNOSIS — E119 Type 2 diabetes mellitus without complications: Secondary | ICD-10-CM | POA: Diagnosis not present

## 2019-08-11 DIAGNOSIS — Z8546 Personal history of malignant neoplasm of prostate: Secondary | ICD-10-CM

## 2019-08-11 DIAGNOSIS — N1330 Unspecified hydronephrosis: Secondary | ICD-10-CM | POA: Diagnosis present

## 2019-08-11 DIAGNOSIS — Z87891 Personal history of nicotine dependence: Secondary | ICD-10-CM

## 2019-08-11 DIAGNOSIS — Z79899 Other long term (current) drug therapy: Secondary | ICD-10-CM | POA: Diagnosis not present

## 2019-08-11 DIAGNOSIS — R1030 Lower abdominal pain, unspecified: Secondary | ICD-10-CM | POA: Diagnosis not present

## 2019-08-11 DIAGNOSIS — Z20828 Contact with and (suspected) exposure to other viral communicable diseases: Secondary | ICD-10-CM | POA: Diagnosis present

## 2019-08-11 DIAGNOSIS — Z7901 Long term (current) use of anticoagulants: Secondary | ICD-10-CM | POA: Diagnosis not present

## 2019-08-11 DIAGNOSIS — I480 Paroxysmal atrial fibrillation: Secondary | ICD-10-CM | POA: Diagnosis present

## 2019-08-11 DIAGNOSIS — K219 Gastro-esophageal reflux disease without esophagitis: Secondary | ICD-10-CM | POA: Diagnosis present

## 2019-08-11 DIAGNOSIS — E7800 Pure hypercholesterolemia, unspecified: Secondary | ICD-10-CM | POA: Diagnosis present

## 2019-08-11 DIAGNOSIS — R338 Other retention of urine: Secondary | ICD-10-CM | POA: Diagnosis not present

## 2019-08-11 DIAGNOSIS — N390 Urinary tract infection, site not specified: Secondary | ICD-10-CM | POA: Diagnosis not present

## 2019-08-11 DIAGNOSIS — Z923 Personal history of irradiation: Secondary | ICD-10-CM | POA: Diagnosis not present

## 2019-08-11 DIAGNOSIS — Z833 Family history of diabetes mellitus: Secondary | ICD-10-CM | POA: Diagnosis not present

## 2019-08-11 DIAGNOSIS — Z825 Family history of asthma and other chronic lower respiratory diseases: Secondary | ICD-10-CM | POA: Diagnosis not present

## 2019-08-11 DIAGNOSIS — K449 Diaphragmatic hernia without obstruction or gangrene: Secondary | ICD-10-CM | POA: Diagnosis not present

## 2019-08-11 DIAGNOSIS — Z8349 Family history of other endocrine, nutritional and metabolic diseases: Secondary | ICD-10-CM

## 2019-08-11 DIAGNOSIS — Z03818 Encounter for observation for suspected exposure to other biological agents ruled out: Secondary | ICD-10-CM | POA: Diagnosis not present

## 2019-08-11 DIAGNOSIS — N320 Bladder-neck obstruction: Secondary | ICD-10-CM | POA: Diagnosis not present

## 2019-08-11 DIAGNOSIS — Z8249 Family history of ischemic heart disease and other diseases of the circulatory system: Secondary | ICD-10-CM | POA: Diagnosis not present

## 2019-08-11 DIAGNOSIS — I10 Essential (primary) hypertension: Secondary | ICD-10-CM | POA: Diagnosis not present

## 2019-08-11 DIAGNOSIS — K5900 Constipation, unspecified: Secondary | ICD-10-CM | POA: Diagnosis present

## 2019-08-11 DIAGNOSIS — R339 Retention of urine, unspecified: Secondary | ICD-10-CM | POA: Diagnosis not present

## 2019-08-11 DIAGNOSIS — N10 Acute tubulo-interstitial nephritis: Secondary | ICD-10-CM | POA: Diagnosis present

## 2019-08-11 DIAGNOSIS — Z7984 Long term (current) use of oral hypoglycemic drugs: Secondary | ICD-10-CM | POA: Diagnosis not present

## 2019-08-11 DIAGNOSIS — N136 Pyonephrosis: Secondary | ICD-10-CM | POA: Diagnosis not present

## 2019-08-11 DIAGNOSIS — N179 Acute kidney failure, unspecified: Secondary | ICD-10-CM | POA: Diagnosis present

## 2019-08-11 DIAGNOSIS — N139 Obstructive and reflux uropathy, unspecified: Secondary | ICD-10-CM | POA: Diagnosis not present

## 2019-08-11 DIAGNOSIS — C61 Malignant neoplasm of prostate: Secondary | ICD-10-CM | POA: Diagnosis present

## 2019-08-11 LAB — CBG MONITORING, ED: Glucose-Capillary: 159 mg/dL — ABNORMAL HIGH (ref 70–99)

## 2019-08-11 LAB — COMPREHENSIVE METABOLIC PANEL
ALT: 15 U/L (ref 0–44)
AST: 21 U/L (ref 15–41)
Albumin: 3.7 g/dL (ref 3.5–5.0)
Alkaline Phosphatase: 82 U/L (ref 38–126)
Anion gap: 12 (ref 5–15)
BUN: 46 mg/dL — ABNORMAL HIGH (ref 8–23)
CO2: 24 mmol/L (ref 22–32)
Calcium: 9.2 mg/dL (ref 8.9–10.3)
Chloride: 103 mmol/L (ref 98–111)
Creatinine, Ser: 2.52 mg/dL — ABNORMAL HIGH (ref 0.61–1.24)
GFR calc Af Amer: 30 mL/min — ABNORMAL LOW (ref >60)
GFR calc non Af Amer: 26 mL/min — ABNORMAL LOW (ref >60)
Glucose, Bld: 175 mg/dL — ABNORMAL HIGH (ref 70–99)
Potassium: 4.7 mmol/L (ref 3.5–5.1)
Sodium: 139 mmol/L (ref 135–145)
Total Bilirubin: 1.5 mg/dL — ABNORMAL HIGH (ref 0.3–1.2)
Total Protein: 7.7 g/dL (ref 6.5–8.1)

## 2019-08-11 LAB — CBC
HCT: 43.3 % (ref 39.0–52.0)
Hemoglobin: 13.7 g/dL (ref 13.0–17.0)
MCH: 27.4 pg (ref 26.0–34.0)
MCHC: 31.6 g/dL (ref 30.0–36.0)
MCV: 86.6 fL (ref 80.0–100.0)
Platelets: 221 10*3/uL (ref 150–400)
RBC: 5 MIL/uL (ref 4.22–5.81)
RDW: 14.8 % (ref 11.5–15.5)
WBC: 15.1 10*3/uL — ABNORMAL HIGH (ref 4.0–10.5)
nRBC: 0 % (ref 0.0–0.2)

## 2019-08-11 LAB — URINALYSIS, ROUTINE W REFLEX MICROSCOPIC
Bacteria, UA: NONE SEEN
Bilirubin Urine: NEGATIVE
Glucose, UA: >=500 mg/dL — AB
Ketones, ur: NEGATIVE mg/dL
Leukocytes,Ua: NEGATIVE
Nitrite: NEGATIVE
Protein, ur: NEGATIVE mg/dL
Specific Gravity, Urine: 1.011 (ref 1.005–1.030)
pH: 5 (ref 5.0–8.0)

## 2019-08-11 LAB — LIPASE, BLOOD: Lipase: 25 U/L (ref 11–51)

## 2019-08-11 MED ORDER — INSULIN ASPART 100 UNIT/ML ~~LOC~~ SOLN
0.0000 [IU] | Freq: Every day | SUBCUTANEOUS | Status: DC
  Filled 2019-08-11: qty 0.05

## 2019-08-11 MED ORDER — IOHEXOL 9 MG/ML PO SOLN
500.0000 mL | ORAL | Status: AC
  Administered 2019-08-11: 500 mL via ORAL

## 2019-08-11 MED ORDER — SODIUM CHLORIDE 0.9 % IV SOLN
1.0000 g | Freq: Every day | INTRAVENOUS | Status: DC
  Administered 2019-08-11 – 2019-08-12 (×2): 1 g via INTRAVENOUS
  Filled 2019-08-11: qty 1
  Filled 2019-08-11 (×2): qty 10

## 2019-08-11 MED ORDER — SODIUM CHLORIDE 0.9 % IV BOLUS
1000.0000 mL | Freq: Once | INTRAVENOUS | Status: AC
  Administered 2019-08-11: 1000 mL via INTRAVENOUS

## 2019-08-11 MED ORDER — ONDANSETRON HCL 4 MG/2ML IJ SOLN
4.0000 mg | Freq: Once | INTRAMUSCULAR | Status: AC
  Administered 2019-08-11: 4 mg via INTRAVENOUS
  Filled 2019-08-11: qty 2

## 2019-08-11 MED ORDER — METOPROLOL TARTRATE 50 MG PO TABS
100.0000 mg | ORAL_TABLET | Freq: Two times a day (BID) | ORAL | Status: DC
  Administered 2019-08-12 – 2019-08-13 (×3): 100 mg via ORAL
  Filled 2019-08-11 (×3): qty 2

## 2019-08-11 MED ORDER — PANTOPRAZOLE SODIUM 40 MG PO TBEC
40.0000 mg | DELAYED_RELEASE_TABLET | Freq: Every day | ORAL | Status: DC
  Administered 2019-08-12 – 2019-08-13 (×2): 40 mg via ORAL
  Filled 2019-08-11 (×2): qty 1

## 2019-08-11 MED ORDER — RIVAROXABAN 20 MG PO TABS
20.0000 mg | ORAL_TABLET | Freq: Every day | ORAL | Status: DC
  Administered 2019-08-12 – 2019-08-13 (×2): 20 mg via ORAL
  Filled 2019-08-11 (×2): qty 1

## 2019-08-11 MED ORDER — ATORVASTATIN CALCIUM 40 MG PO TABS
40.0000 mg | ORAL_TABLET | Freq: Every day | ORAL | Status: DC
  Administered 2019-08-12 – 2019-08-13 (×2): 40 mg via ORAL
  Filled 2019-08-11 (×2): qty 1

## 2019-08-11 MED ORDER — ACETAMINOPHEN 325 MG PO TABS: 650.0000 mg | ORAL_TABLET | Freq: Four times a day (QID) | ORAL | Status: DC | PRN

## 2019-08-11 MED ORDER — INSULIN ASPART 100 UNIT/ML ~~LOC~~ SOLN
0.0000 [IU] | Freq: Three times a day (TID) | SUBCUTANEOUS | Status: DC
  Administered 2019-08-12: 1 [IU] via SUBCUTANEOUS
  Administered 2019-08-12: 2 [IU] via SUBCUTANEOUS
  Filled 2019-08-11: qty 0.09

## 2019-08-11 MED ORDER — AMLODIPINE BESYLATE 5 MG PO TABS
2.5000 mg | ORAL_TABLET | Freq: Every day | ORAL | Status: DC
  Administered 2019-08-12 – 2019-08-13 (×2): 2.5 mg via ORAL
  Filled 2019-08-11 (×2): qty 1

## 2019-08-11 MED ORDER — ACETAMINOPHEN 650 MG RE SUPP: 650.0000 mg | Freq: Four times a day (QID) | RECTAL | Status: DC | PRN

## 2019-08-11 MED ORDER — ONDANSETRON HCL 4 MG PO TABS: 4.0000 mg | ORAL_TABLET | Freq: Four times a day (QID) | ORAL | Status: DC | PRN

## 2019-08-11 MED ORDER — ONDANSETRON HCL 4 MG/2ML IJ SOLN: 4.0000 mg | Freq: Four times a day (QID) | INTRAMUSCULAR | Status: DC | PRN

## 2019-08-11 NOTE — ED Provider Notes (Addendum)
[facility] DEPT Provider Note   CSN: [id number] Arrival date & time: 08/11/19  1528     History Chief Complaint  Patient presents with  . Constipation    Tommy Yang is a 68 y.o. male.  HPI Patient presents to the emergency department with constipation over the last 4 days and increased urinary frequency.  The patient states at times he has decreased urine output.  The patient states he was seen by his doctor yesterday and given a shot of IM Rocephin.  Patient states that he started feeling more dehydrated after the IM injection.  The patient states that nothing seems to make the condition better or worse.  The patient states that he is having discomfort across the lower part of his entire abdomen.  Patient states that he did not take any medications prior to arrival for his symptoms.  The patient denies chest pain, shortness of breath, headache,blurred vision, neck pain, fever, cough, weakness, numbness, dizziness, anorexia, edema,  vomiting, diarrhea, rash, back pain, dysuria, hematemesis, bloody stool, near syncope, or syncope.    Past Medical History:  Diagnosis Date  . Atrial fibrillation ([place])   . Diabetes mellitus without complication ([place])   . DVT (deep venous thrombosis) ([facility])    left leg.  [name] GERD (gastroesophageal reflux disease)   . Hypercholesterolemia   . Hypertension   . Prostate cancer ([place]) 12/06/13   gleason 4+3=7, 6/12 cores positive. seed implant and radiation    Patient Active Problem List   Diagnosis Date Noted  . Obstructive uropathy 08/11/2019  . Acute lower UTI 08/11/2019  . AKI (acute kidney injury) ([place]) 08/11/2019  . Acute urinary retention 08/11/2019  . Dysuria 08/10/2019  . Constipation 08/10/2019  . Low back pain 08/10/2019  . Paroxysmal atrial fibrillation with RVR ([place]) 05/23/2019  . Paroxysmal atrial fibrillation ([place]) 05/23/2019  . Encounter for well adult exam with abnormal findings 02/24/2019  . Leg  wound, right 02/24/2019  . Essential hypertension 09/24/2018  . Persistent atrial fibrillation   . Microalbuminuria 04/23/2016  . Atrial fibrillation ([place]) 12/29/2015  . Rectal bleeding 07/18/2015  . Dysphagia 07/18/2015  . Chronic anticoagulation 03/14/2015  . Morbid obesity ([place]) 03/14/2015  . Exertional shortness of breath 01/12/2015  . Abnormal ECG 01/12/2015  . Encounter for screening colonoscopy 05/09/2014  . Esophageal reflux 05/09/2014  . Other dysphagia 05/09/2014  . Evaluate for OSA (obstructive sleep apnea) 02/28/2014  . Hyperlipidemia 02/28/2014  . Prostate cancer ([place]) 12/14/2013  . Erectile dysfunction 10/26/2013  . Type II diabetes mellitus, uncontrolled ([place]) 10/19/2013  . DVT (deep venous thrombosis) ([place])   . Left leg DVT ([place]) 09/28/2013  . Leg edema, left 09/28/2013    Past Surgical History:  Procedure Laterality Date  . APPENDECTOMY    . BALLOON DILATION N/A 08/02/2015   Procedure: BALLOON DILATION;  Surgeon: [name], MD;  Location: [facility];  Service: Endoscopy;  Laterality: N/A;  . CARDIOVERSION N/A 05/13/2018   Procedure: CARDIOVERSION;  Surgeon: Sanda Klein, MD;  Location: [facility];  Service: Cardiovascular;  Laterality: N/A;  . CARDIOVERSION N/A 05/18/2018   Procedure: CARDIOVERSION;  Surgeon: Buford Dresser, MD;  Location: [facility];  Service: Cardiovascular;  Laterality: N/A;  . COLONOSCOPY WITH PROPOFOL N/A 08/02/2015   Procedure: COLONOSCOPY WITH PROPOFOL w/ APC;  Surgeon: [name], MD;  Location: [facility];  Service: Endoscopy;  Laterality: N/A;  . ESOPHAGOGASTRODUODENOSCOPY (EGD) WITH PROPOFOL N/A 08/02/2015   Procedure: ESOPHAGOGASTRODUODENOSCOPY (EGD) WITH PROPOFOL/ possible dilation.;  Surgeon: [name], MD;  Location: [facility];  Service: Endoscopy;  Laterality: N/A;  . KNEE SURGERY     right knee orthoscopic  . PROSTATE BIOPSY  12/06/13   Gleason 4+3=7, volume 35 gm  . [place]  . TONSILLECTOMY         Family History  Problem Relation Age of Onset  . Heart disease Mother        before age 4  . Diabetes Mother   . Hyperlipidemia Mother   . Varicose Veins Mother   . Hypertension Mother   . Heart disease Father   . Diabetes Father   . Hyperlipidemia Father   . Hypertension Father   . Cancer Brother   . COPD Brother   . Diabetes Brother   . Hyperlipidemia Brother   . Hypertension Brother     Social History   Tobacco Use  . Smoking status: Former Smoker    Packs/day: 0.50    Years: 23.00    Pack years: 11.50    Types: Cigarettes    Quit date: 08/01/2012    Years since quitting: 7.0  . Smokeless tobacco: Never Used  Substance Use Topics  . Alcohol use: Yes    Alcohol/week: 0.0 standard drinks    Comment: very seldom, maybe 3x per year  . Drug use: No    Home Medications Prior to Admission medications   Medication Sig Start Date End Date Taking? Authorizing Provider  amLODipine (NORVASC) 2.5 MG tablet Take 1 tablet (2.5 mg total) by mouth daily. 02/24/19  Yes [name], MD  atorvastatin (LIPITOR) 40 MG tablet TAKE ONE TABLET BY MOUTH ONCE DAILY. Patient taking differently: Take 40 mg by mouth daily after breakfast.  02/24/19  Yes [name], MD  Canagliflozin-metFORMIN HCl ER (INVOKAMET XR) 914 210 9857 MG TB24 Take 2 tablets by mouth daily. 05/12/19  Yes Elayne Snare, MD  CINNAMON PO Take 1,000 mg by mouth daily.   Yes [provider]  Coral Calcium 1000 (390 Ca) MG TABS Take 1,000 mg by mouth daily.   Yes [provider]  glimepiride (AMARYL) 1 MG tablet Take 1 tablet (1 mg total) by mouth daily before supper. 02/24/19  Yes [name], MD  losartan (COZAAR) 50 MG tablet Take 1 tablet (50 mg total) by mouth daily. 05/16/19  Yes [name], MD  metoprolol tartrate (LOPRESSOR) 100 MG tablet TAKE 1 TABLET BY MOUTH TWICE A DAY 08/03/19  Yes Sherran Needs, NP  Multiple Vitamins-Minerals (ZINC PO)  Take 1 tablet by mouth daily.   Yes [provider]  pantoprazole (PROTONIX) 40 MG tablet Take 1 tablet (40 mg total) by mouth daily. 02/24/19  Yes [name], MD  rivaroxaban (XARELTO) 20 MG TABS tablet Take 1 tablet (20 mg total) by mouth daily with supper. Patient taking differently: Take 20 mg by mouth daily after breakfast.  02/24/19  Yes [name], MD  SYNJARDY 12.12-998 MG TABS TAKE 2 TABLETS BY MOUTH EVERY DAY Patient taking differently: Take 2 tablets by mouth daily after breakfast.  02/10/19  Yes Elayne Snare, MD  Turmeric 450 MG CAPS Take 450 mg by mouth daily after breakfast.    Yes [provider]  Vitamin D, Cholecalciferol, 400 UNITS TABS Take 400 Units by mouth daily.   Yes [provider]  blood glucose meter kit and supplies KIT Test blood sugar daily as directed. Dx code: E11.9  01/23/15   Darlyne Russian, MD  glucose blood (ACCU-CHEK GUIDE) test strip Use as instructed to check blood sugar twice daily. 10/29/18   Elayne Snare, MD  leuprolide (LUPRON) 11.25 MG injection Inject 11.25 mg into the muscle every 6 (six) months.     [provider]  OZEMPIC, 1 MG/DOSE, 2 MG/1.5ML SOPN INJECT 1MG UNDER THE SKIN ONCE WEEKLY. Patient taking differently: Inject 1 mg into the skin once a week.  06/28/19   Elayne Snare, MD  Semaglutide, 1 MG/DOSE, (OZEMPIC, 1 MG/DOSE,) 2 MG/1.5ML SOPN Inject 1 mg as directed every Friday. 07/01/19   Elayne Snare, MD    Allergies    Patient has no known allergies.  Review of Systems   Review of Systems All other systems negative except as documented in the HPI. All pertinent positives and negatives as reviewed in the HPI. Physical Exam Updated Vital Signs BP (!) 131/93   Pulse 69   Temp 99.7 F (37.6 C) (Oral)   Resp 17   SpO2 95%   Physical Exam Vitals and nursing note reviewed.  Constitutional:      General: He is not in acute distress.    Appearance: He is well-developed.  HENT:     Head: Normocephalic and  atraumatic.  Eyes:     Pupils: Pupils are equal, round, and reactive to light.  Cardiovascular:     Rate and Rhythm: Normal rate and regular rhythm.     Heart sounds: Normal heart sounds. No murmur. No friction rub. No gallop.   Pulmonary:     Effort: Pulmonary effort is normal. No respiratory distress.     Breath sounds: Normal breath sounds. No wheezing.  Abdominal:     General: Bowel sounds are normal. There is no distension.     Palpations: Abdomen is soft.     Tenderness: There is abdominal tenderness.  Musculoskeletal:     Cervical back: Normal range of motion and neck supple.  Skin:    General: Skin is warm and dry.     Capillary Refill: Capillary refill takes less than 2 seconds.     Findings: No erythema or rash.  Neurological:     Mental Status: He is alert and oriented to person, place, and time.     Motor: No abnormal muscle tone.     Coordination: Coordination normal.  Psychiatric:        Behavior: Behavior normal.     ED Results / Procedures / Treatments   Labs (all labs ordered are listed, but only abnormal results are displayed) Labs Reviewed  COMPREHENSIVE METABOLIC PANEL - Abnormal; Notable for the following components:      Result Value   Glucose, Bld 175 (*)    BUN 46 (*)    Creatinine, Ser 2.52 (*)    Total Bilirubin 1.5 (*)    GFR calc non Af Amer 26 (*)    GFR calc Af Amer 30 (*)    All other components within normal limits  CBC - Abnormal; Notable for the following components:   WBC 15.1 (*)    All other components within normal limits  URINALYSIS, ROUTINE W REFLEX MICROSCOPIC - Abnormal; Notable for the following components:   Glucose, UA >=500 (*)    Hgb urine dipstick MODERATE (*)    All other components within normal limits  CBG MONITORING, ED - Abnormal; Notable for the following components:   Glucose-Capillary 159 (*)    All other components within normal limits  SARS CORONAVIRUS  2 (TAT 6-24 HRS)  URINE CULTURE  LIPASE, BLOOD    HEMOGLOBIN G5Q  BASIC METABOLIC PANEL  CBC    EKG None  Radiology CT ABDOMEN PELVIS WO CONTRAST  Result Date: 08/11/2019 CLINICAL DATA:  Acute, generalized abdominal pain, urinary frequency and constipation for 4 days, history of prostate cancer EXAM: CT ABDOMEN AND PELVIS WITHOUT CONTRAST TECHNIQUE: Multidetector CT imaging of the abdomen and pelvis was performed following the standard protocol without IV contrast. COMPARISON:  PET-CT Jan 05, 2018 FINDINGS: Lower chest: Lung bases are clear. Normal heart size. No pericardial effusion. Atherosclerotic calcification of the coronary arteries. Hepatobiliary: No focal liver abnormality is seen. No gallstones, gallbladder wall thickening, or biliary dilatation. Pancreas: Unremarkable. No pancreatic ductal dilatation or surrounding inflammatory changes. Spleen: Normal in size without focal abnormality. Adrenals/Urinary Tract: Normal adrenal glands. Nonobstructing calculus is seen in lower pole left kidney. Stable lower pole left renal cyst measuring 4.6 cm in size. Marked interval worsening of bilateral perinephric stranding with moderate bilateral hydroureteronephrosis without obstructive urolithiasis. The bladder is also markedly distended with circumferential mural thickening and perivesicular hazy stranding. No bladder or visible urethral calcifications. Stomach/Bowel: Small hiatal hernia. Distal esophagus, stomach and duodenal sweep are unremarkable. No small bowel wall thickening or dilatation. No evidence of obstruction. A normal appendix is visualized. Vascular/Lymphatic: Atherosclerotic plaque within the normal caliber aorta. Previously seen FDG avid left iliac lymph node on comparison PET-CT is no longer readily apparent on today's examination. No suspicious or enlarged lymph nodes in the included lymphatic chains. Some mild subcentimeter retroperitoneal adenopathy is likely related to the inflammatory/infectious process in the kidneys.  Reproductive: Diminutive size of the prostate with some coarse calcification. Few metallic densities may reflect fiducials or brachytherapy seeds. Other: Reactive fluid in the retroperitoneum. No intraperitoneal free fluid or air. Musculoskeletal: No worrisome sclerotic or lytic lesions in the osseous structures. Multilevel degenerative changes are present in the imaged portions of the spine. IMPRESSION: 1. Bilateral perinephric stranding with moderate hydroureteronephrosis without obstructive urolithiasis. The bladder is also markedly distended with circumferential mural thickening and perivesicular hazy stranding. Findings are concerning for urinary retention with ascending urinary tract infection. 2. Postprocedural changes noted at the prostate, similar to comparison is. 3. Previously seen FDG avid left iliac lymph node on comparison PET-CT is no longer readily apparent on today's examination. No suspicious or enlarged lymph nodes in the included lymphatic chains. 4. Small hiatal hernia. 5. Aortic Atherosclerosis (ICD10-I70.0). Electronically Signed   By: Lovena Le M.D.   On: 08/11/2019 21:39    Procedures Procedures (including critical care time)  Medications Ordered in ED Medications  iohexol (OMNIPAQUE) 9 MG/ML oral solution 500 mL ( Oral Canceled Entry 08/11/19 2113)  rivaroxaban (XARELTO) tablet 20 mg (has no administration in time range)  pantoprazole (PROTONIX) EC tablet 40 mg (has no administration in time range)  atorvastatin (LIPITOR) tablet 40 mg (has no administration in time range)  amLODipine (NORVASC) tablet 2.5 mg (has no administration in time range)  metoprolol tartrate (LOPRESSOR) tablet 100 mg (has no administration in time range)  insulin aspart (novoLOG) injection 0-9 Units (has no administration in time range)  insulin aspart (novoLOG) injection 0-5 Units (0 Units Subcutaneous Not Given 08/11/19 2329)  acetaminophen (TYLENOL) tablet 650 mg (has no administration in time  range)    Or  acetaminophen (TYLENOL) suppository 650 mg (has no administration in time range)  ondansetron (ZOFRAN) tablet 4 mg (has no administration in time range)    Or  ondansetron (ZOFRAN) injection  4 mg (has no administration in time range)  cefTRIAXone (ROCEPHIN) 1 g in sodium chloride 0.9 % 100 mL IVPB (has no administration in time range)  sodium chloride 0.9 % bolus 1,000 mL (0 mLs Intravenous Stopped 08/11/19 2143)  ondansetron (ZOFRAN) injection 4 mg (4 mg Intravenous Given 08/11/19 2059)    ED Course  I have reviewed the triage vital signs and the nursing notes.  Pertinent labs & imaging results that were available during my care of the patient were reviewed by me and considered in my medical decision making (see chart for details).    MDM Rules/Calculators/A&P     CHA2DS2/VAS Stroke Risk Points  Current as of about an hour ago     4 >= 2 Points: High Risk  1 - 1.99 Points: Medium Risk  0 Points: Low Risk    This is the only CHA2DS2/VAS Stroke Risk Points available for the past  year.: Last Change: N/A     Details    This score determines the patient's risk of having a stroke if the  patient has atrial fibrillation.       Points Metrics  1 Has Congestive Heart Failure:  Yes    Current as of about an hour ago  0 Has Vascular Disease:  No    Current as of about an hour ago  1 Has Hypertension:  Yes    Current as of about an hour ago  1 Age:  60    Current as of about an hour ago  1 Has Diabetes:  Yes    Current as of about an hour ago  0 Had Stroke:  No  Had TIA:  No  Had thromboembolism:  No    Current as of about an hour ago  0 Male:  No    Current as of about an hour ago                        Patient had an Foley catheter placed due to his post obstructive process.  Patient will be admitted to the hospitalist service for further evaluation and care.  Patient having difficulty with urination as well the Foley catheter was placed as well.  Patient sees  Dr. Tresa Moore as his urologist.  Final Clinical Impression(s) / ED Diagnoses Final diagnoses:  None    Rx / DC Orders ED Discharge Orders    None       Dalia Heading, PA-C 08/11/19 2356    Dalia Heading, PA-C 08/11/19 2356    Lacretia Leigh, MD 08/15/19 1729

## 2019-08-11 NOTE — H&P (Signed)
History and Physical    Tommy Yang CZY:[id number] DOB: 1951-07-21 DOA: 08/11/2019  PCP: [name], MD  Patient coming from: Home  I have personally briefly reviewed patient's old medical records in [place]  Chief Complaint: Urinary frequency  HPI: Tommy Yang is a 68 y.o. male with medical history significant of prostate CA s/p radiation + seed, HTN, DM2, PAF, DVT, on eliquis.  Patient has had urinary frequency and constipation for past 4 days.  Went to PCPs office, UA performed and UCx pending, given IM rocephin injection, script for cipro which he (thankfully) hasnt started yet.  Came in to ED.   ED Course: Creat 2.5 up from 1.1 in Sept.  UA neg, tM 99.7.  CT abd/pelvis shows B hydro due to apparent obstructive uropathy with severe bladder distention.  Also perinephric stranding, suggestive of ascending UTI.  WBC 15k.  Foley placed, >2L UOP thus far and still draining.   Review of Systems: As per HPI, otherwise all review of systems negative.  Past Medical History:  Diagnosis Date  . Atrial fibrillation ([place])   . Diabetes mellitus without complication ([place])   . DVT (deep venous thrombosis) (H[facility])    left leg.  [name] GERD (gastroesophageal reflux disease)   . Hypercholesterolemia   . Hypertension   . Prostate cancer ([place]) 12/06/13   gleason 4+3=7, 6/12 cores positive. seed implant and radiation    Past Surgical History:  Procedure Laterality Date  . APPENDECTOMY    . BALLOON DILATION N/A 08/02/2015   Procedure: BALLOON DILATION;  Surgeon: [name], MD;  Location: [facility];  Service: Endoscopy;  Laterality: N/A;  . CARDIOVERSION N/A 05/13/2018   Procedure: CARDIOVERSION;  Surgeon: Sanda Klein, MD;  Location: [facility];  Service: Cardiovascular;  Laterality: N/A;  . CARDIOVERSION N/A 05/18/2018   Procedure: CARDIOVERSION;  Surgeon: Buford Dresser, MD;  Location: [facility];  Service: Cardiovascular;  Laterality: N/A;  .  COLONOSCOPY WITH PROPOFOL N/A 08/02/2015   Procedure: COLONOSCOPY WITH PROPOFOL w/ APC;  Surgeon: [name], MD;  Location: [facility];  Service: Endoscopy;  Laterality: N/A;  . ESOPHAGOGASTRODUODENOSCOPY (EGD) WITH PROPOFOL N/A 08/02/2015   Procedure: ESOPHAGOGASTRODUODENOSCOPY (EGD) WITH PROPOFOL/ possible dilation.;  Surgeon: [name], MD;  Location: [facility];  Service: Endoscopy;  Laterality: N/A;  . KNEE SURGERY     right knee orthoscopic  . PROSTATE BIOPSY  12/06/13   Gleason 4+3=7, volume 35 gm  . [place]  . TONSILLECTOMY       reports that he quit smoking about 7 years ago. His smoking use included cigarettes. He has a 11.50 pack-year smoking history. He has never used smokeless tobacco. He reports current alcohol use. He reports that he does not use drugs.  No Known Allergies  Family History  Problem Relation Age of Onset  . Heart disease Mother        before age 65  . Diabetes Mother   . Hyperlipidemia Mother   . Varicose Veins Mother   . Hypertension Mother   . Heart disease Father   . Diabetes Father   . Hyperlipidemia Father   . Hypertension Father   . Cancer Brother   . COPD Brother   . Diabetes Brother   . Hyperlipidemia Brother   . Hypertension Brother      Prior to Admission medications   Medication Sig Start Date End Date Taking? Authorizing Provider  amLODipine (NORVASC) 2.5  MG tablet Take 1 tablet (2.5 mg total) by mouth daily. 02/24/19  Yes [name], MD  atorvastatin (LIPITOR) 40 MG tablet TAKE ONE TABLET BY MOUTH ONCE DAILY. Patient taking differently: Take 40 mg by mouth daily after breakfast.  02/24/19  Yes [name], MD  Canagliflozin-metFORMIN HCl ER (INVOKAMET XR) [phone] MG TB24 Take 2 tablets by mouth daily. 05/12/19  Yes Elayne Snare, MD  CINNAMON PO Take 1,000 mg by mouth daily.   Yes [provider]  Coral Calcium 1000 (390 Ca) MG TABS Take 1,000 mg by mouth daily.    Yes [provider]  glimepiride (AMARYL) 1 MG tablet Take 1 tablet (1 mg total) by mouth daily before supper. 02/24/19  Yes [name], MD  losartan (COZAAR) 50 MG tablet Take 1 tablet (50 mg total) by mouth daily. 05/16/19  Yes [name], MD  metoprolol tartrate (LOPRESSOR) 100 MG tablet TAKE 1 TABLET BY MOUTH TWICE A DAY 08/03/19  Yes Sherran Needs, NP  Multiple Vitamins-Minerals (ZINC PO) Take 1 tablet by mouth daily.   Yes [provider]  pantoprazole (PROTONIX) 40 MG tablet Take 1 tablet (40 mg total) by mouth daily. 02/24/19  Yes [name], MD  rivaroxaban (XARELTO) 20 MG TABS tablet Take 1 tablet (20 mg total) by mouth daily with supper. Patient taking differently: Take 20 mg by mouth daily after breakfast.  02/24/19  Yes [name], MD  SYNJARDY 12.12-998 MG TABS TAKE 2 TABLETS BY MOUTH EVERY DAY Patient taking differently: Take 2 tablets by mouth daily after breakfast.  02/10/19  Yes Elayne Snare, MD  Turmeric 450 MG CAPS Take 450 mg by mouth daily after breakfast.    Yes [provider]  Vitamin D, Cholecalciferol, 400 UNITS TABS Take 400 Units by mouth daily.   Yes [provider]  blood glucose meter kit and supplies KIT Test blood sugar daily as directed. Dx code: E11.9 01/23/15   Darlyne Russian, MD  glucose blood (ACCU-CHEK GUIDE) test strip Use as instructed to check blood sugar twice daily. 10/29/18   Elayne Snare, MD  leuprolide (LUPRON) 11.25 MG injection Inject 11.25 mg into the muscle every 6 (six) months.     [provider]  OZEMPIC, 1 MG/DOSE, 2 MG/1.5ML SOPN INJECT 1MG UNDER THE SKIN ONCE WEEKLY. Patient taking differently: Inject 1 mg into the skin once a week.  06/28/19   Elayne Snare, MD  Semaglutide, 1 MG/DOSE, (OZEMPIC, 1 MG/DOSE,) 2 MG/1.5ML SOPN Inject 1 mg as directed every Friday. 07/01/19   Elayne Snare, MD    Physical Exam: Vitals:   08/11/19 1902 08/11/19 2000 08/11/19 2100 08/11/19 2308  BP:  138/87 126/90  (!) 131/93  Yang: (!) 59 92 (!) 107 69  Resp:  _0 Temp:      TempSrc:      SpO2: 97% 95% 91% 95%    Constitutional: NAD, calm, comfortable Eyes: PERRL, lids and conjunctivae normal ENMT: Mucous membranes are moist. Posterior pharynx clear of any exudate or lesions.Normal dentition.  Neck: normal, supple, no masses, no thyromegaly Respiratory: clear to auscultation bilaterally, no wheezing, no crackles. Normal respiratory effort. No accessory muscle use.  Cardiovascular: Regular rate and rhythm, no murmurs / rubs / gallops. No extremity edema. 2+ pedal pulses. No carotid bruits.  Abdomen: no tenderness, no masses palpated. No hepatosplenomegaly. Bowel sounds positive.  Musculoskeletal: no clubbing / cyanosis. No joint deformity upper and lower extremities. Good ROM,  no contractures. Normal muscle tone.  Skin: no rashes, lesions, ulcers. No induration Neurologic: CN 2-12 grossly intact. Sensation intact, DTR normal. Strength 5/5 in all 4.  Psychiatric: Normal judgment and insight. Alert and oriented x 3. Normal mood.    Labs on Admission: I have personally reviewed following labs and imaging studies  CBC: Recent Labs  Lab 08/11/19 1649  WBC 15.1*  HGB 13.7  HCT 43.3  MCV 86.6  PLT 093   Basic Metabolic Panel: Recent Labs  Lab 08/11/19 1649  NA 139  K 4.7  CL 103  CO2 24  GLUCOSE 175*  BUN 46*  CREATININE 2.52*  CALCIUM 9.2   GFR: Estimated Creatinine Clearance: 42.3 mL/min (A) (by C-G formula based on SCr of 2.52 mg/dL (H)). Liver Function Tests: Recent Labs  Lab 08/11/19 1649  AST 21  ALT 15  ALKPHOS 82  BILITOT 1.5*  PROT 7.7  ALBUMIN 3.7   Recent Labs  Lab 08/11/19 1649  LIPASE 25   No results for input(s): AMMONIA in the last 168 hours. Coagulation Profile: No results for input(s): INR, PROTIME in the last 168 hours. Cardiac Enzymes: No results for input(s): CKTOTAL, CKMB, CKMBINDEX, TROPONINI in the last 168 hours. BNP (last 3  results) No results for input(s): PROBNP in the last 8760 hours. HbA1C: No results for input(s): HGBA1C in the last 72 hours. CBG: Recent Labs  Lab 08/11/19 2323  GLUCAP 159*   Lipid Profile: No results for input(s): CHOL, HDL, LDLCALC, TRIG, CHOLHDL, LDLDIRECT in the last 72 hours. Thyroid Function Tests: No results for input(s): TSH, T4TOTAL, FREET4, T3FREE, THYROIDAB in the last 72 hours. Anemia Panel: No results for input(s): VITAMINB12, FOLATE, FERRITIN, TIBC, IRON, RETICCTPCT in the last 72 hours. Urine analysis:    Component Value Date/Time   COLORURINE YELLOW 08/11/2019 1543   APPEARANCEUR CLEAR 08/11/2019 1543   LABSPEC 1.011 08/11/2019 1543   PHURINE 5.0 08/11/2019 1543   GLUCOSEU >=500 (A) 08/11/2019 1543   GLUCOSEU >=1000 (A) 08/10/2019 1004   HGBUR MODERATE (A) 08/11/2019 1543   BILIRUBINUR NEGATIVE 08/11/2019 1543   BILIRUBINUR neg 12/14/2013 0810   KETONESUR NEGATIVE 08/11/2019 1543   PROTEINUR NEGATIVE 08/11/2019 1543   UROBILINOGEN 0.2 08/10/2019 1004   NITRITE NEGATIVE 08/11/2019 1543   LEUKOCYTESUR NEGATIVE 08/11/2019 1543    Radiological Exams on Admission: CT ABDOMEN PELVIS WO CONTRAST  Result Date: 08/11/2019 CLINICAL DATA:  Acute, generalized abdominal pain, urinary frequency and constipation for 4 days, history of prostate cancer EXAM: CT ABDOMEN AND PELVIS WITHOUT CONTRAST TECHNIQUE: Multidetector CT imaging of the abdomen and pelvis was performed following the standard protocol without IV contrast. COMPARISON:  PET-CT Jan 05, 2018 FINDINGS: Lower chest: Lung bases are clear. Normal heart size. No pericardial effusion. Atherosclerotic calcification of the coronary arteries. Hepatobiliary: No focal liver abnormality is seen. No gallstones, gallbladder wall thickening, or biliary dilatation. Pancreas: Unremarkable. No pancreatic ductal dilatation or surrounding inflammatory changes. Spleen: Normal in size without focal abnormality. Adrenals/Urinary Tract:  Normal adrenal glands. Nonobstructing calculus is seen in lower pole left kidney. Stable lower pole left renal cyst measuring 4.6 cm in size. Marked interval worsening of bilateral perinephric stranding with moderate bilateral hydroureteronephrosis without obstructive urolithiasis. The bladder is also markedly distended with circumferential mural thickening and perivesicular hazy stranding. No bladder or visible urethral calcifications. Stomach/Bowel: Small hiatal hernia. Distal esophagus, stomach and duodenal sweep are unremarkable. No small bowel wall thickening or dilatation. No evidence of obstruction. A normal appendix is visualized. Vascular/Lymphatic: Atherosclerotic  plaque within the normal caliber aorta. Previously seen FDG avid left iliac lymph node on comparison PET-CT is no longer readily apparent on today's examination. No suspicious or enlarged lymph nodes in the included lymphatic chains. Some mild subcentimeter retroperitoneal adenopathy is likely related to the inflammatory/infectious process in the kidneys. Reproductive: Diminutive size of the prostate with some coarse calcification. Few metallic densities may reflect fiducials or brachytherapy seeds. Other: Reactive fluid in the retroperitoneum. No intraperitoneal free fluid or air. Musculoskeletal: No worrisome sclerotic or lytic lesions in the osseous structures. Multilevel degenerative changes are present in the imaged portions of the spine. IMPRESSION: 1. Bilateral perinephric stranding with moderate hydroureteronephrosis without obstructive urolithiasis. The bladder is also markedly distended with circumferential mural thickening and perivesicular hazy stranding. Findings are concerning for urinary retention with ascending urinary tract infection. 2. Postprocedural changes noted at the prostate, similar to comparison is. 3. Previously seen FDG avid left iliac lymph node on comparison PET-CT is no longer readily apparent on today's  examination. No suspicious or enlarged lymph nodes in the included lymphatic chains. 4. Small hiatal hernia. 5. Aortic Atherosclerosis (ICD10-I70.0). Electronically Signed   By: Lovena Le M.D.   On: 08/11/2019 21:39    EKG: Independently reviewed.  Assessment/Plan Principal Problem:   Obstructive uropathy Active Problems:   Type II diabetes mellitus, uncontrolled (HCC)   Prostate cancer ([place])   Essential hypertension   Paroxysmal atrial fibrillation ([facility])   Acute lower UTI   AKI (acute kidney injury) ([place])   Acute urinary retention    1. Obstructive uropathy with AKI due to acute urinary retention - 1. >2L UOP post foley placement and still going 1. Suggested to RN they may want to clamp foley for a short time period so it doesn't become hemorrhagic 2. Strict intake and output 3. Repeat BMP in AM 4. Hold ARB 5. Needs urology consult vs follow up 2. ? Of UTI - 1. Tm 99.7 2. CT suggestive of ascending UTI 3. WBC 15k 4. But, UA not very impressive (0 WBC, no LE, no nitrites). 5. PCP started him on ABx 6. Will go ahead and put him on rocephin for the moment 7. UCx from PCP office visit yesterday is pending, got 89m rocephin IM yesterday in office. 8. DC cipro given AKI 3. DM2 - 1. Hold home PO hypoglycemics 2. Sensitive SSI AC/HS 4. HTN - 1. Hold ARB 2. Continue metoprolol and amlodipine 5. H/o prostate CA - s/p radiation and seed 6. PAF - 1. Continue BB 2. Continue Xarelto  DVT prophylaxis: Xarelto Code Status: Full Family Communication: No family in room Disposition Plan: Home after admit Consults called: none Admission status: Place in o20   Iylah Dworkin, JPhilmontHospitalists  How to contact the TIz[facility] Center LLCAttending or Consulting provider [place] covering provider during after hours [place] for this patient?  1. Check the care team in [facility] Centerand look for a) attending/consulting TR[facility] provider listed and b) the TSt [facility]S [facility] Centerteam listed 2. Log into [email or web address]  Amion  Physician Scheduling and messaging for groups and whole hospitals  On call and physician scheduling software for group practices, residents, hospitalists and other medical providers for call, clinic, rotation and shift schedules. OnCall Enterprise is a hospital-wide system for scheduling doctors and paging doctors on call. EasyPlot is for scientific plotting and data analysis.  [email or web address]  and use [place]'s universal password to access. If you do not have the password, please contact the hospital operator.  3. Locate the [facility] provider you are looking for under [facility] and page to a number that you can be directly reached. 4. If you still have difficulty reaching the provider, please page the [facility] (Director on Call) for the Hospitalists listed on amion for assistance.  08/11/2019, 11:39 PM

## 2019-08-11 NOTE — Telephone Encounter (Signed)
Noted  

## 2019-08-11 NOTE — ED Triage Notes (Addendum)
Patient c/o urinary frequency and constipation x4 days. Seen at PCP and reports IM abx injection. Reports using enemas and mag citrate at home without relief. Hx prostate cancer. Reports one episode of vomiting.

## 2019-08-11 NOTE — Telephone Encounter (Signed)
Pt called in c/o severe abd pain due to severe constipation and "barely urinating". He is in so much pain,  "I can't even sit down".    Not had a BM in 4 days.  He saw Dr. Jenny Reichmann yesterday and was started on an antibiotic for a UTI.  He has taken 2 bottles of Mag Citrate and stool softeners that Dr. Jenny Reichmann instructed him to take without success.   He was supposed to get an Rx for Flomax yesterday from Dr. Jenny Reichmann but "I don't know what happened".   "The pharmacy didn't have it".      Due to the severe pain and little urine being passed and the severe constipation and pain I have referred him to the ED.   He was agreeable to going.   He is going to [facility].    I sent these notes to Dr. Gwynn Burly office.   Reason for Disposition . Patient sounds very sick or weak to the triager  Answer Assessment - Initial Assessment Questions 1. STOOL PATTERN OR FREQUENCY: "How often do you pass bowel movements (BMs)?"  (Normal range: tid to q 3 days)  "When was the last BM passed?"       Saw Dr. Jenny Reichmann yesterday for UTI.   I've not had a BM in 3-4 days and I'm hurting bad.  I drank a bottle yesterday and today of Mag Citrate.  I'm having terrible pain.   I'm so dehydrated I'm hardly peeing.    My mouth is like paste I'm so dry. 2. STRAINING: "Do you have to strain to have a BM?"      I got out 4 little nuggets  of stool.    I was for a Flomax Rx but the Rx wasn't written.    I can't even sit down due to the pain. 3. RECTAL PAIN: "Does your rectum hurt when the stool comes out?" If so, ask: "Do you have hemorrhoids? How bad is the pain?"  (Scale 1-10; or mild, moderate, severe)     Not asked.  Referred to the ED 4. STOOL COMPOSITION: "Are the stools hard?"      Yes only got 4 small nuggets out. 5. BLOOD ON STOOLS: "Has there been any blood on the toilet tissue or on the surface of the BM?" If so, ask: "When was the last time?"      No 6. CHRONIC CONSTIPATION: "Is this a new problem for you?"  If no, ask: "How  long have you had this problem?" (days, weeks, months)      Not asked.   Referred on to the ED due to severe pain he was having plus he has barely urinated.   Being treated for a UTI by Dr. Jenny Reichmann. 7. CHANGES IN DIET: "Have there been any recent changes in your diet?"      No 8. MEDICATIONS: "Have you been taking any new medications?"     Antibiotic for the UTI.   Dr. Jenny Reichmann has me taking stool softeners and I've had 2 bottles of Mag Citrate without results. 9. LAXATIVES: "Have you been using any laxatives or enemas?"  If yes, ask "What, how often, and when was the last time?"     See above 10. CAUSE: "What do you think is causing the constipation?"        I know I'm real dehydrated I can't go even with medications. 11. OTHER SYMPTOMS: "Do you have any other symptoms?" (e.g., abdominal pain, fever, vomiting)  Pain so bad I can't sit down. 12. PREGNANCY: "Is there any chance you are pregnant?" "When was your last menstrual period?"       N/A  Protocols used: CONSTIPATION-A-AH

## 2019-08-12 ENCOUNTER — Encounter (HOSPITAL_COMMUNITY): Payer: Self-pay | Admitting: Internal Medicine

## 2019-08-12 DIAGNOSIS — Z923 Personal history of irradiation: Secondary | ICD-10-CM | POA: Diagnosis not present

## 2019-08-12 DIAGNOSIS — N139 Obstructive and reflux uropathy, unspecified: Secondary | ICD-10-CM | POA: Diagnosis not present

## 2019-08-12 DIAGNOSIS — R339 Retention of urine, unspecified: Secondary | ICD-10-CM | POA: Diagnosis present

## 2019-08-12 DIAGNOSIS — Z8546 Personal history of malignant neoplasm of prostate: Secondary | ICD-10-CM | POA: Diagnosis not present

## 2019-08-12 DIAGNOSIS — Z79899 Other long term (current) drug therapy: Secondary | ICD-10-CM | POA: Diagnosis not present

## 2019-08-12 DIAGNOSIS — N1330 Unspecified hydronephrosis: Secondary | ICD-10-CM | POA: Diagnosis present

## 2019-08-12 DIAGNOSIS — E7800 Pure hypercholesterolemia, unspecified: Secondary | ICD-10-CM | POA: Diagnosis present

## 2019-08-12 DIAGNOSIS — N390 Urinary tract infection, site not specified: Secondary | ICD-10-CM | POA: Diagnosis present

## 2019-08-12 DIAGNOSIS — N320 Bladder-neck obstruction: Secondary | ICD-10-CM | POA: Diagnosis present

## 2019-08-12 DIAGNOSIS — Z825 Family history of asthma and other chronic lower respiratory diseases: Secondary | ICD-10-CM | POA: Diagnosis not present

## 2019-08-12 DIAGNOSIS — Z833 Family history of diabetes mellitus: Secondary | ICD-10-CM | POA: Diagnosis not present

## 2019-08-12 DIAGNOSIS — N179 Acute kidney failure, unspecified: Secondary | ICD-10-CM | POA: Diagnosis present

## 2019-08-12 DIAGNOSIS — Z20828 Contact with and (suspected) exposure to other viral communicable diseases: Secondary | ICD-10-CM | POA: Diagnosis present

## 2019-08-12 DIAGNOSIS — K219 Gastro-esophageal reflux disease without esophagitis: Secondary | ICD-10-CM | POA: Diagnosis present

## 2019-08-12 DIAGNOSIS — I480 Paroxysmal atrial fibrillation: Secondary | ICD-10-CM | POA: Diagnosis present

## 2019-08-12 DIAGNOSIS — E119 Type 2 diabetes mellitus without complications: Secondary | ICD-10-CM | POA: Diagnosis not present

## 2019-08-12 DIAGNOSIS — I10 Essential (primary) hypertension: Secondary | ICD-10-CM | POA: Diagnosis present

## 2019-08-12 DIAGNOSIS — Z7984 Long term (current) use of oral hypoglycemic drugs: Secondary | ICD-10-CM | POA: Diagnosis not present

## 2019-08-12 DIAGNOSIS — Z7901 Long term (current) use of anticoagulants: Secondary | ICD-10-CM | POA: Diagnosis not present

## 2019-08-12 DIAGNOSIS — Z8349 Family history of other endocrine, nutritional and metabolic diseases: Secondary | ICD-10-CM | POA: Diagnosis not present

## 2019-08-12 DIAGNOSIS — K5900 Constipation, unspecified: Secondary | ICD-10-CM | POA: Diagnosis present

## 2019-08-12 DIAGNOSIS — Z8249 Family history of ischemic heart disease and other diseases of the circulatory system: Secondary | ICD-10-CM | POA: Diagnosis not present

## 2019-08-12 DIAGNOSIS — Z87891 Personal history of nicotine dependence: Secondary | ICD-10-CM | POA: Diagnosis not present

## 2019-08-12 DIAGNOSIS — E1165 Type 2 diabetes mellitus with hyperglycemia: Secondary | ICD-10-CM | POA: Diagnosis present

## 2019-08-12 DIAGNOSIS — N136 Pyonephrosis: Secondary | ICD-10-CM | POA: Diagnosis present

## 2019-08-12 LAB — GLUCOSE, CAPILLARY
Glucose-Capillary: 109 mg/dL — ABNORMAL HIGH (ref 70–99)
Glucose-Capillary: 190 mg/dL — ABNORMAL HIGH (ref 70–99)
Glucose-Capillary: 131 mg/dL — ABNORMAL HIGH (ref 70–99)
Glucose-Capillary: 135 mg/dL — ABNORMAL HIGH (ref 70–99)

## 2019-08-12 LAB — CBC
HCT: 43.4 % (ref 39.0–52.0)
Hemoglobin: 13.4 g/dL (ref 13.0–17.0)
MCH: 27.1 pg (ref 26.0–34.0)
MCHC: 30.9 g/dL (ref 30.0–36.0)
MCV: 87.9 fL (ref 80.0–100.0)
Platelets: 199 10*3/uL (ref 150–400)
RBC: 4.94 MIL/uL (ref 4.22–5.81)
RDW: 15.1 % (ref 11.5–15.5)
WBC: 12.4 10*3/uL — ABNORMAL HIGH (ref 4.0–10.5)
nRBC: 0 % (ref 0.0–0.2)

## 2019-08-12 LAB — BASIC METABOLIC PANEL
Anion gap: 12 (ref 5–15)
BUN: 47 mg/dL — ABNORMAL HIGH (ref 8–23)
CO2: 24 mmol/L (ref 22–32)
Calcium: 8.9 mg/dL (ref 8.9–10.3)
Chloride: 107 mmol/L (ref 98–111)
Creatinine, Ser: 2.28 mg/dL — ABNORMAL HIGH (ref 0.61–1.24)
GFR calc Af Amer: 33 mL/min — ABNORMAL LOW (ref >60)
GFR calc non Af Amer: 29 mL/min — ABNORMAL LOW (ref >60)
Glucose, Bld: 125 mg/dL — ABNORMAL HIGH (ref 70–99)
Potassium: 4.6 mmol/L (ref 3.5–5.1)
Sodium: 143 mmol/L (ref 135–145)

## 2019-08-12 LAB — HEMOGLOBIN A1C
Hgb A1c MFr Bld: 7.3 % — ABNORMAL HIGH (ref 4.8–5.6)
Mean Plasma Glucose: 162.81 mg/dL

## 2019-08-12 LAB — URINE CULTURE
MICRO NUMBER:: 1180364
Result:: NO GROWTH
SPECIMEN QUALITY:: ADEQUATE

## 2019-08-12 LAB — SARS CORONAVIRUS 2 (TAT 6-24 HRS): SARS Coronavirus 2: NEGATIVE

## 2019-08-12 MED ORDER — CHLORHEXIDINE GLUCONATE CLOTH 2 % EX PADS
6.0000 | MEDICATED_PAD | Freq: Every day | CUTANEOUS | Status: DC
  Administered 2019-08-12: 6 via TOPICAL

## 2019-08-12 MED ORDER — TAMSULOSIN HCL 0.4 MG PO CAPS
0.4000 mg | ORAL_CAPSULE | Freq: Every day | ORAL | Status: DC
  Administered 2019-08-12 – 2019-08-13 (×2): 0.4 mg via ORAL
  Filled 2019-08-12 (×2): qty 1

## 2019-08-12 NOTE — Progress Notes (Signed)
Patient arrived to the floor at @0320 . Patient alert & oriented x 4 on RA. NAD noted. Patient educated on floor/room. Skin assessment completed. Patient denied any sob/pain at time. Foley assessed and care given. Home meds noted in patient belongings.  Patient educated on the policy and stated his wife will pick them up in the afternoon. Will cont to monitor.

## 2019-08-12 NOTE — Progress Notes (Signed)
PROGRESS NOTE    Tommy Yang  [id number] DOB: 1950/12/31 DOA: 08/11/2019 PCP: Tommy Borg, MD   Brief Narrative:  Tommy Yang is a 68 y.o. male with medical history significant of prostate CA s/p radiation + seed, HTN, DM2, PAF, DVT, on eliquis. He presents in the ED with increased urinary frequency and constipation for past 4 days.  He went to his PCPs office where UA was positive for UTI he was given 1 dose IM Rocephin and was given prescription for ciprofloxacin and was sent home.  He report worsening symptoms so he came to the ED He is found to have Creat 2.5 up from 1.1 in Sept.  UA neg, tM 99.7.  CT abd/pelvis shows B hydro due to apparent obstructive uropathy with severe bladder distention.  Also perinephric stranding, suggestive of ascending UTI. He was admitted for UTI and for inpatient urology consult.   Assessment & Plan:   Principal Problem:   Obstructive uropathy Active Problems:   Type II diabetes mellitus, uncontrolled (HCC)   Prostate cancer ([place])   Essential hypertension   Paroxysmal atrial fibrillation (HCC)   Acute lower UTI   AKI (acute kidney injury) ([place])   Acute urinary retention   Bilateral hydronephrosis  # Obstructive uropathy with AKI due to acute urinary retention - - >2L UOP post foley placement, will try to clamp the catheter and remove - Strict intake and output - Repeat BMP in AM, renal functions improving - Hold ARB - Urology consulted,  will follow recommendation.   # Urinary tract infection:   -CT suggestive of ascending UTI - WBC 15 - UA not very impressive (0 WBC, no LE, no nitrites). -Continue ceftriaxone for now, we will check the urine culture-  -UCx from PCP office visit yesterday is pending, got 1mg  rocephin IM yesterday in office. - DC cipro given AKI  # DM2 - 1. Hold home PO hypoglycemics 2. Sensitive SSI AC/HS 3.  # HTN - 1. Hold ARB 2. Continue metoprolol and amlodipine.   # H/o prostate CA - s/p  radiation and seeds  # PAF - 1. Continue BB 2. Continue Xarelto  DVT prophylaxis: Xarelto Code Status: Full Family Communication:  Discussed with patient in detail Disposition Plan:  Anticipated discharge tomorrow if renal functions improve and cleared by urology Consults called: none Admission status:  Inpatient   Consultants:   Urology  Procedures:  Antimicrobials:  Anti-infectives (From admission, onward)   Start     Dose/Rate Route Frequency Ordered Stop   08/11/19 2345  cefTRIAXone (ROCEPHIN) 1 g in sodium chloride 0.9 % 100 mL IVPB     1 g 200 mL/hr over 30 Minutes Intravenous Daily at bedtime 08/11/19 2338        Subjective: Patient was seen and examined at bedside, he denies any abdominal pain, nausea, vomiting, diarrhea.  There is no CVA tenderness.  Objective: Vitals:   08/12/19 0231 08/12/19 0339 08/12/19 0346 08/12/19 0605  BP: (!) 159/77 (!) 122/93 (!) 122/93 (!) 151/73  Pulse: (!) 109 82 82 (!) 105  Resp: 18 18 18 18   Temp:  98.4 F (36.9 C) 98.4 F (36.9 C) 98.1 F (36.7 C)  TempSrc:   Oral Oral  SpO2: 94% 94%  97%  Weight:   (!) 146.5 kg   Height:   5' 10.98" (1.803 m)     Intake/Output Summary (Last 24 hours) at 08/12/2019 1539 Last data filed at 08/12/2019 1300 Gross per 24 hour  Intake 1580  ml  Output 7050 ml  Net -5470 ml   Filed Weights   08/12/19 0346  Weight: (!) 146.5 kg    Examination:  General exam: Appears calm and comfortable  Respiratory system: Clear to auscultation. Respiratory effort normal. Cardiovascular system: S1 & S2 heard, RRR. No JVD, murmurs, rubs, gallops or clicks. No pedal edema. Gastrointestinal system: Abdomen is nondistended, soft and nontender. No organomegaly or masses felt. Normal bowel sounds heard. Central nervous system: Alert and oriented. No focal neurological deficits. Extremities: Symmetric 5 x 5 power. Skin: No rashes, lesions or ulcers Psychiatry: Judgement and insight appear normal. Mood  & affect appropriate.   Data Reviewed: I have personally reviewed following labs and imaging studies  CBC: Recent Labs  Lab 08/11/19 1649 08/12/19 0556  WBC 15.1* 12.4*  HGB 13.7 13.4  HCT 43.3 43.4  MCV 86.6 87.9  PLT 221 [id number]   Basic Metabolic Panel: Recent Labs  Lab 08/11/19 1649 08/12/19 0556  NA 139 143  K 4.7 4.6  CL 103 107  CO2 24 24  GLUCOSE 175* 125*  BUN 46* 47*  CREATININE 2.52* 2.28*  CALCIUM 9.2 8.9   GFR: Estimated Creatinine Clearance: 46.8 mL/min (A) (by C-G formula based on SCr of 2.28 mg/dL (H)). Liver Function Tests: Recent Labs  Lab 08/11/19 1649  AST 21  ALT 15  ALKPHOS 82  BILITOT 1.5*  PROT 7.7  ALBUMIN 3.7   Recent Labs  Lab 08/11/19 1649  LIPASE 25   No results for input(s): AMMONIA in the last 168 hours. Coagulation Profile: No results for input(s): INR, PROTIME in the last 168 hours. Cardiac Enzymes: No results for input(s): CKTOTAL, CKMB, CKMBINDEX, TROPONINI in the last 168 hours. BNP (last 3 results) No results for input(s): PROBNP in the last 8760 hours. HbA1C: Recent Labs    08/11/19 2327  HGBA1C 7.3*   CBG: Recent Labs  Lab 08/11/19 2323 08/12/19 0742 08/12/19 1150  GLUCAP 159* 109* 190*   Lipid Profile: No results for input(s): CHOL, HDL, LDLCALC, TRIG, CHOLHDL, LDLDIRECT in the last 72 hours. Thyroid Function Tests: No results for input(s): TSH, T4TOTAL, FREET4, T3FREE, THYROIDAB in the last 72 hours. Anemia Panel: No results for input(s): VITAMINB12, FOLATE, FERRITIN, TIBC, IRON, RETICCTPCT in the last 72 hours. Sepsis Labs: No results for input(s): PROCALCITON, LATICACIDVEN in the last 168 hours.  Recent Results (from the past 240 hour(s))  Urine Culture     Status: None   Collection Time: 08/10/19 10:04 AM   Specimen: Urine  Result Value Ref Range Status   MICRO NUMBER: [id number]  Final   SPECIMEN QUALITY: Adequate  Final   Sample Source NOT GIVEN  Final   STATUS: FINAL  Final   Result: No  Growth  Final  SARS CORONAVIRUS 2 (TAT 6-24 HRS) Nasopharyngeal Nasopharyngeal Swab     Status: None   Collection Time: 08/11/19 11:08 PM   Specimen: Nasopharyngeal Swab  Result Value Ref Range Status   SARS Coronavirus 2 NEGATIVE NEGATIVE Final    Comment: (NOTE) SARS-CoV-2 target nucleic acids are NOT DETECTED. The SARS-CoV-2 RNA is generally detectable in upper and lower respiratory specimens during the acute phase of infection. Negative results do not preclude SARS-CoV-2 infection, do not rule out co-infections with other pathogens, and should not be used as the sole basis for treatment or other patient management decisions. Negative results must be combined with clinical observations, patient history, and epidemiological information. The expected result is Negative. Fact Sheet for Patients: [employer] Fact  Sheet for Healthcare Providers: https://www.woods-mathews.com/ This test is not yet approved or cleared by the [place] FDA and  has been authorized for detection and/or diagnosis of SARS-CoV-2 by FDA under an Emergency Use Authorization (EUA). This EUA will remain  in effect (meaning this test can be used) for the duration of the COVID-19 declaration under Section 56 4(b)(1) of the Act, 21 U.S.C. section 360bbb-3(b)(1), unless the authorization is terminated or revoked sooner. Performed at [facility], [place] [address], [place], [place] [address]      Radiology Studies: CT ABDOMEN PELVIS WO CONTRAST  Result Date: 08/11/2019 CLINICAL DATA:  Acute, generalized abdominal pain, urinary frequency and constipation for 4 days, history of prostate cancer EXAM: CT ABDOMEN AND PELVIS WITHOUT CONTRAST TECHNIQUE: Multidetector CT imaging of the abdomen and pelvis was performed following the standard protocol without IV contrast. COMPARISON:  PET-CT Jan 05, 2018 FINDINGS: Lower chest: Lung bases are clear. Normal heart size. No  pericardial effusion. Atherosclerotic calcification of the coronary arteries. Hepatobiliary: No focal liver abnormality is seen. No gallstones, gallbladder wall thickening, or biliary dilatation. Pancreas: Unremarkable. No pancreatic ductal dilatation or surrounding inflammatory changes. Spleen: Normal in size without focal abnormality. Adrenals/Urinary Tract: Normal adrenal glands. Nonobstructing calculus is seen in lower pole left kidney. Stable lower pole left renal cyst measuring 4.6 cm in size. Marked interval worsening of bilateral perinephric stranding with moderate bilateral hydroureteronephrosis without obstructive urolithiasis. The bladder is also markedly distended with circumferential mural thickening and perivesicular hazy stranding. No bladder or visible urethral calcifications. Stomach/Bowel: Small hiatal hernia. Distal esophagus, stomach and duodenal sweep are unremarkable. No small bowel wall thickening or dilatation. No evidence of obstruction. A normal appendix is visualized. Vascular/Lymphatic: Atherosclerotic plaque within the normal caliber aorta. Previously seen FDG avid left iliac lymph node on comparison PET-CT is no longer readily apparent on today's examination. No suspicious or enlarged lymph nodes in the included lymphatic chains. Some mild subcentimeter retroperitoneal adenopathy is likely related to the inflammatory/infectious process in the kidneys. Reproductive: Diminutive size of the prostate with some coarse calcification. Few metallic densities may reflect fiducials or brachytherapy seeds. Other: Reactive fluid in the retroperitoneum. No intraperitoneal free fluid or air. Musculoskeletal: No worrisome sclerotic or lytic lesions in the osseous structures. Multilevel degenerative changes are present in the imaged portions of the spine. IMPRESSION: 1. Bilateral perinephric stranding with moderate hydroureteronephrosis without obstructive urolithiasis. The bladder is also markedly  distended with circumferential mural thickening and perivesicular hazy stranding. Findings are concerning for urinary retention with ascending urinary tract infection. 2. Postprocedural changes noted at the prostate, similar to comparison is. 3. Previously seen FDG avid left iliac lymph node on comparison PET-CT is no longer readily apparent on today's examination. No suspicious or enlarged lymph nodes in the included lymphatic chains. 4. Small hiatal hernia. 5. Aortic Atherosclerosis (ICD10-I70.0). Electronically Signed   By: Lovena Le M.D.   On: 08/11/2019 21:39    Scheduled Meds: . amLODipine  2.5 mg Oral Daily  . atorvastatin  40 mg Oral QPC breakfast  . Chlorhexidine Gluconate Cloth  6 each Topical Daily  . insulin aspart  0-5 Units Subcutaneous QHS  . insulin aspart  0-9 Units Subcutaneous TID WC  . metoprolol tartrate  100 mg Oral BID  . pantoprazole  40 mg Oral Daily  . rivaroxaban  20 mg Oral QPC breakfast   Continuous Infusions: . cefTRIAXone (ROCEPHIN)  IV Stopped (08/12/19 0053)     LOS: 0 days    Time  spent:    Shawna Clamp, MD [facility]   If 7PM-7AM, please contact night-coverage

## 2019-08-12 NOTE — Consult Note (Signed)
Urology Consult   Physician requesting consult: Dr. Shawna Clamp  Reason for consult: AKI and bilateral hydronephrosis  History of Present Illness: Tommy Yang is a 68 y.o. with a history of prostate cancer on androgen deprivation and s/p radiation therapy for high risk disease.  He is followed by Dr. Tresa Moore.  He has no history of BPH or medical therapy for voiding symptoms.  He began noting the acute onset of difficulty voiding Monday including frequency and nocturia that was severe.  He also developed some incontinence.  He was seen by his PCP on Wednesday who empirically treated him for a UTI but UA was unimpressive and culture is now negative.  He presented to the ED in AKI with Cr of 2.5 (baseline 1.1) and a CT scan indicated bilateral hydronephrosis with a very distended bladder.  He had a catheter placed with about 1 L returned according to the patient.  His renal function is improving today with Cr decreased to 2.28.  He was having abdominal pain which is now resolved s/p catheter placement.  He denies dysuria, hematuria, fever, or flank pain.  Past Medical History:  Diagnosis Date  . Atrial fibrillation ([place])   . Diabetes mellitus without complication ([place])   . DVT (deep venous thrombosis) ([facility])    left leg.  [name] GERD (gastroesophageal reflux disease)   . Hypercholesterolemia   . Hypertension   . Prostate cancer ([place]) 12/06/13   gleason 4+3=7, 6/12 cores positive. seed implant and radiation    Past Surgical History:  Procedure Laterality Date  . APPENDECTOMY    . BALLOON DILATION N/A 08/02/2015   Procedure: BALLOON DILATION;  Surgeon: [name], MD;  Location: [facility];  Service: Endoscopy;  Laterality: N/A;  . CARDIOVERSION N/A 05/13/2018   Procedure: CARDIOVERSION;  Surgeon: Sanda Klein, MD;  Location: [facility];  Service: Cardiovascular;  Laterality: N/A;  . CARDIOVERSION N/A 05/18/2018   Procedure: CARDIOVERSION;  Surgeon: Buford Dresser, MD;   Location: [facility];  Service: Cardiovascular;  Laterality: N/A;  . COLONOSCOPY WITH PROPOFOL N/A 08/02/2015   Procedure: COLONOSCOPY WITH PROPOFOL w/ APC;  Surgeon: [name], MD;  Location: [facility];  Service: Endoscopy;  Laterality: N/A;  . ESOPHAGOGASTRODUODENOSCOPY (EGD) WITH PROPOFOL N/A 08/02/2015   Procedure: ESOPHAGOGASTRODUODENOSCOPY (EGD) WITH PROPOFOL/ possible dilation.;  Surgeon: [name], MD;  Location: [facility];  Service: Endoscopy;  Laterality: N/A;  . KNEE SURGERY     right knee orthoscopic  . PROSTATE BIOPSY  12/06/13   Gleason 4+3=7, volume 35 gm  . [place]  . TONSILLECTOMY      Medications:  Home meds:  No current facility-administered medications on file prior to encounter.   Current Outpatient Medications on File Prior to Encounter  Medication Sig Dispense Refill  . amLODipine (NORVASC) 2.5 MG tablet Take 1 tablet (2.5 mg total) by mouth daily. 90 tablet 1  . atorvastatin (LIPITOR) 40 MG tablet TAKE ONE TABLET BY MOUTH ONCE DAILY. (Patient taking differently: Take 40 mg by mouth daily after breakfast. ) 90 tablet 1  . Canagliflozin-metFORMIN HCl ER (INVOKAMET XR) [phone] MG TB24 Take 2 tablets by mouth daily. 180 tablet 2  . CINNAMON PO Take 1,000 mg by mouth daily.    [name] Coral Calcium 1000 (390 Ca) MG TABS Take 1,000 mg by mouth daily.    [name] glimepiride (AMARYL) 1 MG tablet Take 1 tablet (1 mg total) by mouth daily before supper. 90 tablet  1  . losartan (COZAAR) 50 MG tablet Take 1 tablet (50 mg total) by mouth daily. 90 tablet 1  . metoprolol tartrate (LOPRESSOR) 100 MG tablet TAKE 1 TABLET BY MOUTH TWICE A DAY 180 tablet 2  . Multiple Vitamins-Minerals (ZINC PO) Take 1 tablet by mouth daily.    . pantoprazole (PROTONIX) 40 MG tablet Take 1 tablet (40 mg total) by mouth daily. 90 tablet 3  . rivaroxaban (XARELTO) 20 MG TABS tablet Take 1 tablet (20 mg total) by mouth daily with supper. (Patient  taking differently: Take 20 mg by mouth daily after breakfast. ) 90 tablet 1  . SYNJARDY 12.12-998 MG TABS TAKE 2 TABLETS BY MOUTH EVERY DAY (Patient taking differently: Take 2 tablets by mouth daily after breakfast. ) 180 tablet 1  . Turmeric 450 MG CAPS Take 450 mg by mouth daily after breakfast.     . Vitamin D, Cholecalciferol, 400 UNITS TABS Take 400 Units by mouth daily.    . blood glucose meter kit and supplies KIT Test blood sugar daily as directed. Dx code: E11.9 1 each 0  . glucose blood (ACCU-CHEK GUIDE) test strip Use as instructed to check blood sugar twice daily. 100 each 3  . leuprolide (LUPRON) 11.25 MG injection Inject 11.25 mg into the muscle every 6 (six) months.     [name] OZEMPIC, 1 MG/DOSE, 2 MG/1.5ML SOPN INJECT 1MG UNDER THE SKIN ONCE WEEKLY. (Patient taking differently: Inject 1 mg into the skin once a week. ) 9 pen 1  . Semaglutide, 1 MG/DOSE, (OZEMPIC, 1 MG/DOSE,) 2 MG/1.5ML SOPN Inject 1 mg as directed every Friday. 6 pen 1     Scheduled Meds: . amLODipine  2.5 mg Oral Daily  . atorvastatin  40 mg Oral QPC breakfast  . Chlorhexidine Gluconate Cloth  6 each Topical Daily  . insulin aspart  0-5 Units Subcutaneous QHS  . insulin aspart  0-9 Units Subcutaneous TID WC  . metoprolol tartrate  100 mg Oral BID  . pantoprazole  40 mg Oral Daily  . rivaroxaban  20 mg Oral QPC breakfast   Continuous Infusions: . cefTRIAXone (ROCEPHIN)  IV Stopped (08/12/19 0053)   PRN Meds:.acetaminophen **OR** acetaminophen, ondansetron **OR** ondansetron (ZOFRAN) IV  Allergies: No Known Allergies  Family History  Problem Relation Age of Onset  . Heart disease Mother        before age 35  . Diabetes Mother   . Hyperlipidemia Mother   . Varicose Veins Mother   . Hypertension Mother   . Heart disease Father   . Diabetes Father   . Hyperlipidemia Father   . Hypertension Father   . Cancer Brother   . COPD Brother   . Diabetes Brother   . Hyperlipidemia Brother   . Hypertension  Brother     Social History:  reports that he quit smoking about 7 years ago. His smoking use included cigarettes. He has a 11.50 pack-year smoking history. He has never used smokeless tobacco. He reports current alcohol use. He reports that he does not use drugs.  ROS: A complete review of systems was performed.  All systems are negative except for pertinent findings as noted.  Physical Exam:  Vital signs in last 24 hours: Temp:  [98.1 F (36.7 C)-98.4 F (36.9 C)] 98.1 F (36.7 C) (12/11 0605) Pulse Rate:  [59-109] 105 (12/11 0605) Resp:  [16-18] 18 (12/11 0605) BP: (122-159)/(51-95) 151/73 (12/11 0605) SpO2:  [91 %-97 %] 97 % (12/11 0605) Weight:  [146.5 kg]  146.5 kg (12/11 0346) Constitutional:  Alert and oriented, No acute distress Cardiovascular: Regular rate and rhythm, No JVD Respiratory: Normal respiratory effort, Lungs clear bilaterally GI: Abdomen is obese, soft, nontender, nondistended, no abdominal masses Genitourinary: No CVAT. Normal male phallus, testes are descended bilaterally and non-tender and without masses, scrotum is normal in appearance without lesions or masses, perineum is normal on inspection.  Indwelling catheter with grossly clear urine. Lymphatic: No lymphadenopathy Neurologic: Grossly intact, no focal deficits Psychiatric: Normal mood and affect  Laboratory Data:  Recent Labs    08/11/19 1649 08/12/19 0556  WBC 15.1* 12.4*  HGB 13.7 13.4  HCT 43.3 43.4  PLT 221 199    Recent Labs    08/11/19 1649 08/12/19 0556  NA 139 143  K 4.7 4.6  CL 103 107  GLUCOSE 175* 125*  BUN 46* 47*  CALCIUM 9.2 8.9  CREATININE 2.52* 2.28*     Results for orders placed or performed during the hospital encounter of 08/11/19 (from the past 24 hour(s))  SARS CORONAVIRUS 2 (TAT 6-24 HRS) Nasopharyngeal Nasopharyngeal Swab     Status: None   Collection Time: 08/11/19 11:08 PM   Specimen: Nasopharyngeal Swab  Result Value Ref Range   SARS Coronavirus 2  NEGATIVE NEGATIVE  CBG monitoring, ED     Status: Abnormal   Collection Time: 08/11/19 11:23 PM  Result Value Ref Range   Glucose-Capillary 159 (H) 70 - 99 mg/dL  Hemoglobin A1c     Status: Abnormal   Collection Time: 08/11/19 11:27 PM  Result Value Ref Range   Hgb A1c MFr Bld 7.3 (H) 4.8 - 5.6 %   Mean Plasma Glucose 162.81 mg/dL  Basic metabolic panel     Status: Abnormal   Collection Time: 08/12/19  5:56 AM  Result Value Ref Range   Sodium 143 135 - 145 mmol/L   Potassium 4.6 3.5 - 5.1 mmol/L   Chloride 107 98 - 111 mmol/L   CO2 24 22 - 32 mmol/L   Glucose, Bld 125 (H) 70 - 99 mg/dL   BUN 47 (H) 8 - 23 mg/dL   Creatinine, Ser 2.28 (H) 0.61 - 1.24 mg/dL   Calcium 8.9 8.9 - 10.3 mg/dL   GFR calc non Af Amer 29 (L) >60 mL/min   GFR calc Af Amer 33 (L) >60 mL/min   Anion gap 12 5 - 15  CBC     Status: Abnormal   Collection Time: 08/12/19  5:56 AM  Result Value Ref Range   WBC 12.4 (H) 4.0 - 10.5 K/uL   RBC 4.94 4.22 - 5.81 MIL/uL   Hemoglobin 13.4 13.0 - 17.0 g/dL   HCT 43.4 39.0 - 52.0 %   MCV 87.9 80.0 - 100.0 fL   MCH 27.1 26.0 - 34.0 pg   MCHC 30.9 30.0 - 36.0 g/dL   RDW 15.1 11.5 - 15.5 %   Platelets 199 150 - 400 K/uL   nRBC 0.0 0.0 - 0.2 %  Glucose, capillary     Status: Abnormal   Collection Time: 08/12/19  7:42 AM  Result Value Ref Range   Glucose-Capillary 109 (H) 70 - 99 mg/dL  Glucose, capillary     Status: Abnormal   Collection Time: 08/12/19 11:50 AM  Result Value Ref Range   Glucose-Capillary 190 (H) 70 - 99 mg/dL  Glucose, capillary     Status: Abnormal   Collection Time: 08/12/19  4:50 PM  Result Value Ref Range   Glucose-Capillary 131 (H) 70 -  99 mg/dL   Recent Results (from the past 240 hour(s))  Urine Culture     Status: None   Collection Time: 08/10/19 10:04 AM   Specimen: Urine  Result Value Ref Range Status   MICRO NUMBER: [id number]  Final   SPECIMEN QUALITY: Adequate  Final   Sample Source NOT GIVEN  Final   STATUS: FINAL  Final    Result: No Growth  Final  SARS CORONAVIRUS 2 (TAT 6-24 HRS) Nasopharyngeal Nasopharyngeal Swab     Status: None   Collection Time: 08/11/19 11:08 PM   Specimen: Nasopharyngeal Swab  Result Value Ref Range Status   SARS Coronavirus 2 NEGATIVE NEGATIVE Final    Comment: (NOTE) SARS-CoV-2 target nucleic acids are NOT DETECTED. The SARS-CoV-2 RNA is generally detectable in upper and lower respiratory specimens during the acute phase of infection. Negative results do not preclude SARS-CoV-2 infection, do not rule out co-infections with other pathogens, and should not be used as the sole basis for treatment or other patient management decisions. Negative results must be combined with clinical observations, patient history, and epidemiological information. The expected result is Negative. Fact Sheet for Patients: SugarRoll.be Fact Sheet for Healthcare Providers: https://www.woods-mathews.com/ This test is not yet approved or cleared by the [place] FDA and  has been authorized for detection and/or diagnosis of SARS-CoV-2 by FDA under an Emergency Use Authorization (EUA). This EUA will remain  in effect (meaning this test can be used) for the duration of the COVID-19 declaration under Section 56 4(b)(1) of the Act, 21 U.S.C. section 360bbb-3(b)(1), unless the authorization is terminated or revoked sooner. Performed at [facility], [place] [address] [place], [place] [address]     Renal Function: Recent Labs    08/11/19 1649 08/12/19 0556  CREATININE 2.52* 2.28*   Estimated Creatinine Clearance: 46.8 mL/min (A) (by C-G formula based on SCr of 2.28 mg/dL (H)).  Radiologic Imaging: CT ABDOMEN PELVIS WO CONTRAST  Result Date: 08/11/2019 CLINICAL DATA:  Acute, generalized abdominal pain, urinary frequency and constipation for 4 days, history of prostate cancer EXAM: CT ABDOMEN AND PELVIS WITHOUT CONTRAST TECHNIQUE: Multidetector CT  imaging of the abdomen and pelvis was performed following the standard protocol without IV contrast. COMPARISON:  PET-CT Jan 05, 2018 FINDINGS: Lower chest: Lung bases are clear. Normal heart size. No pericardial effusion. Atherosclerotic calcification of the coronary arteries. Hepatobiliary: No focal liver abnormality is seen. No gallstones, gallbladder wall thickening, or biliary dilatation. Pancreas: Unremarkable. No pancreatic ductal dilatation or surrounding inflammatory changes. Spleen: Normal in size without focal abnormality. Adrenals/Urinary Tract: Normal adrenal glands. Nonobstructing calculus is seen in lower pole left kidney. Stable lower pole left renal cyst measuring 4.6 cm in size. Marked interval worsening of bilateral perinephric stranding with moderate bilateral hydroureteronephrosis without obstructive urolithiasis. The bladder is also markedly distended with circumferential mural thickening and perivesicular hazy stranding. No bladder or visible urethral calcifications. Stomach/Bowel: Small hiatal hernia. Distal esophagus, stomach and duodenal sweep are unremarkable. No small bowel wall thickening or dilatation. No evidence of obstruction. A normal appendix is visualized. Vascular/Lymphatic: Atherosclerotic plaque within the normal caliber aorta. Previously seen FDG avid left iliac lymph node on comparison PET-CT is no longer readily apparent on today's examination. No suspicious or enlarged lymph nodes in the included lymphatic chains. Some mild subcentimeter retroperitoneal adenopathy is likely related to the inflammatory/infectious process in the kidneys. Reproductive: Diminutive size of the prostate with some coarse calcification. Few metallic densities may reflect fiducials or brachytherapy seeds. Other: Reactive  fluid in the retroperitoneum. No intraperitoneal free fluid or air. Musculoskeletal: No worrisome sclerotic or lytic lesions in the osseous structures. Multilevel degenerative  changes are present in the imaged portions of the spine. IMPRESSION: 1. Bilateral perinephric stranding with moderate hydroureteronephrosis without obstructive urolithiasis. The bladder is also markedly distended with circumferential mural thickening and perivesicular hazy stranding. Findings are concerning for urinary retention with ascending urinary tract infection. 2. Postprocedural changes noted at the prostate, similar to comparison is. 3. Previously seen FDG avid left iliac lymph node on comparison PET-CT is no longer readily apparent on today's examination. No suspicious or enlarged lymph nodes in the included lymphatic chains. 4. Small hiatal hernia. 5. Aortic Atherosclerosis (ICD10-I70.0). Electronically Signed   By: Lovena Le M.D.   On: 08/11/2019 21:39    I independently reviewed the above imaging studies.  Impression/Recommendation 1) AKI due to obstructive uropathy related to bladder outlet obstruction:  Continue indwelling catheter considering renal dysfunction.  Begin alpha blocker therapy and continue as outpatient.  Continue catheter upon discharge and ok to discharge from urologic standpoint if renal function continuing to improve tomorrow.  Will arrange f/u with Dr. Tresa Moore as an outpatient next week for possible voiding trial.  Please call if further questions.  Dutch Gray 08/12/2019, 5:53 PM    Pryor Curia MD  CC: Dr. Shanda Howells

## 2019-08-13 ENCOUNTER — Other Ambulatory Visit: Payer: Self-pay

## 2019-08-13 ENCOUNTER — Emergency Department (HOSPITAL_COMMUNITY)
Admission: EM | Admit: 2019-08-13 | Discharge: 2019-08-13 | Disposition: A | Discharge status: Home / Self Care | Payer: BC Managed Care – PPO | Attending: Emergency Medicine | Admitting: Emergency Medicine

## 2019-08-13 DIAGNOSIS — Z87891 Personal history of nicotine dependence: Secondary | ICD-10-CM | POA: Diagnosis not present

## 2019-08-13 DIAGNOSIS — Z79899 Other long term (current) drug therapy: Secondary | ICD-10-CM | POA: Diagnosis not present

## 2019-08-13 DIAGNOSIS — R339 Retention of urine, unspecified: Secondary | ICD-10-CM | POA: Insufficient documentation

## 2019-08-13 DIAGNOSIS — E119 Type 2 diabetes mellitus without complications: Secondary | ICD-10-CM | POA: Diagnosis not present

## 2019-08-13 DIAGNOSIS — I10 Essential (primary) hypertension: Secondary | ICD-10-CM | POA: Insufficient documentation

## 2019-08-13 LAB — URINE CULTURE: Culture: NO GROWTH

## 2019-08-13 LAB — CBC
HCT: 42.9 % (ref 39.0–52.0)
Hemoglobin: 13.4 g/dL (ref 13.0–17.0)
MCH: 27.4 pg (ref 26.0–34.0)
MCHC: 31.2 g/dL (ref 30.0–36.0)
MCV: 87.7 fL (ref 80.0–100.0)
Platelets: 203 10*3/uL (ref 150–400)
RBC: 4.89 MIL/uL (ref 4.22–5.81)
RDW: 14.8 % (ref 11.5–15.5)
WBC: 12.1 10*3/uL — ABNORMAL HIGH (ref 4.0–10.5)
nRBC: 0 % (ref 0.0–0.2)

## 2019-08-13 LAB — BASIC METABOLIC PANEL
Anion gap: 12 (ref 5–15)
BUN: 45 mg/dL — ABNORMAL HIGH (ref 8–23)
CO2: 24 mmol/L (ref 22–32)
Calcium: 8.9 mg/dL (ref 8.9–10.3)
Chloride: 104 mmol/L (ref 98–111)
Creatinine, Ser: 2.01 mg/dL — ABNORMAL HIGH (ref 0.61–1.24)
GFR calc Af Amer: 39 mL/min — ABNORMAL LOW (ref >60)
GFR calc non Af Amer: 34 mL/min — ABNORMAL LOW (ref >60)
Glucose, Bld: 128 mg/dL — ABNORMAL HIGH (ref 70–99)
Potassium: 4.4 mmol/L (ref 3.5–5.1)
Sodium: 140 mmol/L (ref 135–145)

## 2019-08-13 LAB — URINALYSIS, MICROSCOPIC (REFLEX): Squamous Epithelial / HPF: NONE SEEN (ref 0–5)

## 2019-08-13 LAB — GLUCOSE, CAPILLARY: Glucose-Capillary: 105 mg/dL — ABNORMAL HIGH (ref 70–99)

## 2019-08-13 LAB — URINALYSIS, ROUTINE W REFLEX MICROSCOPIC
Bilirubin Urine: NEGATIVE
Glucose, UA: >=500 mg/dL — AB
Ketones, ur: NEGATIVE mg/dL
Leukocytes,Ua: NEGATIVE
Nitrite: NEGATIVE
Protein, ur: 30 mg/dL — AB
Specific Gravity, Urine: 1.011 (ref 1.005–1.030)
pH: 5 (ref 5.0–8.0)

## 2019-08-13 MED ORDER — TAMSULOSIN HCL 0.4 MG PO CAPS: 0.4000 mg | ORAL_CAPSULE | Freq: Every day | ORAL | 30 refills | Status: DC

## 2019-08-13 NOTE — Progress Notes (Signed)
Nsg Discharge Note  Admit Date:  08/11/2019 Discharge date: 08/13/2019   Tommy Yang to be D/C'd Home per MD order.  AVS completed.  Copy for chart, and copy for patient signed, and dated. Patient/caregiver able to verbalize understanding.  Discharge Medication: Allergies as of 08/13/2019   No Known Allergies     Medication List    STOP taking these medications   glimepiride 1 MG tablet Commonly known as: Amaryl   Invokamet XR [phone] MG Tb24 Generic drug: Canagliflozin-metFORMIN HCl ER   rivaroxaban 20 MG Tabs tablet Commonly known as: Xarelto   Synjardy 12.12-998 MG Tabs Generic drug: Empagliflozin-metFORMIN HCl     TAKE these medications   amLODipine 2.5 MG tablet Commonly known as: NORVASC Take 1 tablet (2.5 mg total) by mouth daily.   atorvastatin 40 MG tablet Commonly known as: LIPITOR TAKE ONE TABLET BY MOUTH ONCE DAILY. What changed:   how much to take  how to take this  when to take this  additional instructions   blood glucose meter kit and supplies Kit Test blood sugar daily as directed. Dx code: E11.9   CINNAMON PO Take 1,000 mg by mouth daily.   Coral Calcium 1000 (390 Ca) MG Tabs Take 1,000 mg by mouth daily.   glucose blood test strip Commonly known as: Accu-Chek Guide Use as instructed to check blood sugar twice daily.   leuprolide 11.25 MG injection Commonly known as: LUPRON Inject 11.25 mg into the muscle every 6 (six) months.   losartan 50 MG tablet Commonly known as: COZAAR Take 1 tablet (50 mg total) by mouth daily.   metoprolol tartrate 100 MG tablet Commonly known as: LOPRESSOR TAKE 1 TABLET BY MOUTH TWICE A DAY   Ozempic (1 MG/DOSE) 2 MG/1.5ML Sopn Generic drug: Semaglutide (1 MG/DOSE) INJECT 1MG UNDER THE SKIN ONCE WEEKLY. What changed:   See the new instructions.  Another medication with the same name was removed. Continue taking this medication, and follow the directions you see here.   pantoprazole 40  MG tablet Commonly known as: PROTONIX Take 1 tablet (40 mg total) by mouth daily.   tamsulosin 0.4 MG Caps capsule Commonly known as: FLOMAX Take 1 capsule (0.4 mg total) by mouth daily. Start taking on: August 14, 2019   Turmeric 450 MG Caps Take 450 mg by mouth daily after breakfast.   Vitamin D (Cholecalciferol) 10 MCG (400 UNIT) Tabs Take 400 Units by mouth daily.   ZINC PO Take 1 tablet by mouth daily.       Discharge Assessment: Vitals:   08/12/19 2137 08/13/19 0611  BP: 127/71 102/79  Pulse: 87 97  Resp: (!) 21 18  Temp: 98.4 F (36.9 C) 98.5 F (36.9 C)  SpO2: 94% 96%   Skin clean, dry and intact without evidence of skin break down, no evidence of skin tears noted. IV catheter discontinued intact. Site without signs and symptoms of complications - no redness or edema noted at insertion site, patient denies c/o pain - only slight tenderness at site.  Dressing with slight pressure applied. Foley cath intact and functional. Foley care education provided to patient and spouse.   D/c Instructions-Education: Discharge instructions given to patient/family with verbalized understanding. D/c education completed with patient/family including follow up instructions, medication list, d/c activities limitations if indicated, with other d/c instructions as indicated by MD - patient able to verbalize understanding, all questions fully answered. Patient instructed to return to ED, call 911, or call MD for any changes in  condition.  Patient escorted via [place], and D/C home via private auto.  Eustace Pen, RN 08/13/2019 12:48 PM

## 2019-08-13 NOTE — Discharge Instructions (Signed)
Advised to follow with PCP in 1 week. We will request PCP to check CBC and BMP in 1 week Advised to follow-up with urology for possible voiding trial. Patient has been discharged with [facility] catheter. Patient has been discharged on Flomax regularly.

## 2019-08-13 NOTE — ED Notes (Signed)
Rn attempted to flush foley, no urine return noted. Penis noted to be swollen and irritated in appearance. Exudate noted around the catheter. Patient reports pain.  RN removed catheter. NT attempted to bladder scan, no success.

## 2019-08-13 NOTE — ED Triage Notes (Signed)
Patient reports he was d/c today after having urinary retention. Patient reports he feels he is having retention again. Patient reports he is concerned there is a clot blocking the tube and that the issue started when he was switched from a regular drainage bag to a leg bag.

## 2019-08-13 NOTE — ED Provider Notes (Signed)
[facility] DEPT Provider Note   CSN: [id number] Arrival date & time: 08/13/19  1916     History Chief Complaint  Patient presents with  . Urinary Retention    Tommy Yang is a 68 y.o. male.  HPI      He presents for problems with Foley catheter, decreased output in the bag, and blood in the catheter bag.  He states that the bleeding and decreased urinary output occurred today when his leg bag was changed from the right leg to the left leg, prior to being discharged from the hospital.  He been hospitalized for 2 days, for bladder outlet obstruction, and hydronephrosis.  He is on Xarelto, for DVT but it has been held because of blood in urine.  He denies fever, chills, nausea, vomiting, weakness or dizziness.  There are no other known modifying factors.  Past Medical History:  Diagnosis Date  . Atrial fibrillation ([place])   . Diabetes mellitus without complication ([place])   . DVT (deep venous thrombosis) ([facility])    left leg.  [name] GERD (gastroesophageal reflux disease)   . Hypercholesterolemia   . Hypertension   . Prostate cancer ([place]) 12/06/13   gleason 4+3=7, 6/12 cores positive. seed implant and radiation    Patient Active Problem List   Diagnosis Date Noted  . Bilateral hydronephrosis 08/12/2019  . Obstructive uropathy 08/11/2019  . Acute lower UTI 08/11/2019  . AKI (acute kidney injury) ([place]) 08/11/2019  . Acute urinary retention 08/11/2019  . Dysuria 08/10/2019  . Constipation 08/10/2019  . Low back pain 08/10/2019  . Paroxysmal atrial fibrillation with RVR ([place]) 05/23/2019  . Paroxysmal atrial fibrillation ([place]) 05/23/2019  . Encounter for well adult exam with abnormal findings 02/24/2019  . Leg wound, right 02/24/2019  . Essential hypertension 09/24/2018  . Persistent atrial fibrillation   . Microalbuminuria 04/23/2016  . Atrial fibrillation ([place]) 12/29/2015  . Rectal bleeding 07/18/2015  . Dysphagia 07/18/2015  . Chronic  anticoagulation 03/14/2015  . Morbid obesity ([place]) 03/14/2015  . Exertional shortness of breath 01/12/2015  . Abnormal ECG 01/12/2015  . Encounter for screening colonoscopy 05/09/2014  . Esophageal reflux 05/09/2014  . Other dysphagia 05/09/2014  . Evaluate for OSA (obstructive sleep apnea) 02/28/2014  . Hyperlipidemia 02/28/2014  . Prostate cancer ([place]) 12/14/2013  . Erectile dysfunction 10/26/2013  . Type II diabetes mellitus, uncontrolled ([place]) 10/19/2013  . DVT (deep venous thrombosis) ([place])   . Left leg DVT ([place]) 09/28/2013  . Leg edema, left 09/28/2013    Past Surgical History:  Procedure Laterality Date  . APPENDECTOMY    . BALLOON DILATION N/A 08/02/2015   Procedure: BALLOON DILATION;  Surgeon: [name], MD;  Location: [facility];  Service: Endoscopy;  Laterality: N/A;  . CARDIOVERSION N/A 05/13/2018   Procedure: CARDIOVERSION;  Surgeon: Sanda Klein, MD;  Location: [facility];  Service: Cardiovascular;  Laterality: N/A;  . CARDIOVERSION N/A 05/18/2018   Procedure: CARDIOVERSION;  Surgeon: Buford Dresser, MD;  Location: [facility];  Service: Cardiovascular;  Laterality: N/A;  . COLONOSCOPY WITH PROPOFOL N/A 08/02/2015   Procedure: COLONOSCOPY WITH PROPOFOL w/ APC;  Surgeon: [name], MD;  Location: [facility];  Service: Endoscopy;  Laterality: N/A;  . ESOPHAGOGASTRODUODENOSCOPY (EGD) WITH PROPOFOL N/A 08/02/2015   Procedure: ESOPHAGOGASTRODUODENOSCOPY (EGD) WITH PROPOFOL/ possible dilation.;  Surgeon: [name], MD;  Location: [facility];  Service: Endoscopy;  Laterality: N/A;  . KNEE SURGERY     right knee orthoscopic  . PROSTATE BIOPSY  12/06/13  Gleason 4+3=7, volume 35 gm  . [place]  . TONSILLECTOMY         Family History  Problem Relation Age of Onset  . Heart disease Mother        before age 61  . Diabetes Mother   . Hyperlipidemia Mother   . Varicose Veins Mother   .  Hypertension Mother   . Heart disease Father   . Diabetes Father   . Hyperlipidemia Father   . Hypertension Father   . Cancer Brother   . COPD Brother   . Diabetes Brother   . Hyperlipidemia Brother   . Hypertension Brother     Social History   Tobacco Use  . Smoking status: Former Smoker    Packs/day: 0.50    Years: 23.00    Pack years: 11.50    Types: Cigarettes    Quit date: 08/01/2012    Years since quitting: 7.0  . Smokeless tobacco: Never Used  Substance Use Topics  . Alcohol use: Yes    Alcohol/week: 0.0 standard drinks    Comment: very seldom, maybe 3x per year  . Drug use: No    Home Medications Prior to Admission medications   Medication Sig Start Date End Date Taking? Authorizing Provider  amLODipine (NORVASC) 2.5 MG tablet Take 1 tablet (2.5 mg total) by mouth daily. 02/24/19   [name], MD  atorvastatin (LIPITOR) 40 MG tablet TAKE ONE TABLET BY MOUTH ONCE DAILY. Patient taking differently: Take 40 mg by mouth daily after breakfast.  02/24/19   [name], MD  blood glucose meter kit and supplies KIT Test blood sugar daily as directed. Dx code: E11.9 01/23/15   Darlyne Russian, MD  CINNAMON PO Take 1,000 mg by mouth daily.    [provider]  Coral Calcium 1000 (390 Ca) MG TABS Take 1,000 mg by mouth daily.    [provider]  glucose blood (ACCU-CHEK GUIDE) test strip Use as instructed to check blood sugar twice daily. 10/29/18   Elayne Snare, MD  leuprolide (LUPRON) 11.25 MG injection Inject 11.25 mg into the muscle every 6 (six) months.     [provider]  losartan (COZAAR) 50 MG tablet Take 1 tablet (50 mg total) by mouth daily. 05/16/19   [name], MD  metoprolol tartrate (LOPRESSOR) 100 MG tablet TAKE 1 TABLET BY MOUTH TWICE A DAY 08/03/19   Sherran Needs, NP  Multiple Vitamins-Minerals (ZINC PO) Take 1 tablet by mouth daily.    [provider]  OZEMPIC, 1 MG/DOSE, 2 MG/1.5ML SOPN INJECT 1MG UNDER THE SKIN ONCE  WEEKLY. Patient taking differently: Inject 1 mg into the skin once a week.  06/28/19   Elayne Snare, MD  pantoprazole (PROTONIX) 40 MG tablet Take 1 tablet (40 mg total) by mouth daily. 02/24/19   [name], MD  tamsulosin (FLOMAX) 0.4 MG CAPS capsule Take 1 capsule (0.4 mg total) by mouth daily. 08/14/19   Shawna Clamp, MD  Turmeric 450 MG CAPS Take 450 mg by mouth daily after breakfast.     [provider]  Vitamin D, Cholecalciferol, 400 UNITS TABS Take 400 Units by mouth daily.    [provider]    Allergies    Patient has no known allergies.  Review of Systems   Review of Systems  All other systems reviewed and are negative.   Physical Exam Updated Vital Signs BP 121/81  Pulse 100   Temp 98.6 F (37 C) (Oral)   Resp (!) 22   SpO2 96%   Physical Exam Vitals and nursing note reviewed.  Constitutional:      General: He is not in acute distress.    Appearance: He is well-developed. He is obese. He is not ill-appearing, toxic-appearing or diaphoretic.  HENT:     Head: Normocephalic and atraumatic.     Right Ear: External ear normal.     Left Ear: External ear normal.  Eyes:     Conjunctiva/sclera: Conjunctivae normal.     Pupils: Pupils are equal, round, and reactive to light.  Neck:     Trachea: Phonation normal.  Cardiovascular:     Rate and Rhythm: Normal rate.  Pulmonary:     Effort: Pulmonary effort is normal.  Abdominal:     Palpations: Abdomen is soft.     Tenderness: There is abdominal tenderness (Tender suprapubic, mild).     Comments: Obesity limits evaluation of intra-abdominal contents.  Musculoskeletal:        General: Normal range of motion.     Cervical back: Normal range of motion and neck supple.     Comments: Mild tenderness lumbar, bilateral lower.  Skin:    General: Skin is warm and dry.  Neurological:     Mental Status: He is alert and oriented to person, place, and time.     Cranial Nerves: No cranial nerve deficit.       Sensory: No sensory deficit.     Motor: No abnormal muscle tone.     Coordination: Coordination normal.  Psychiatric:        Mood and Affect: Mood normal.        Behavior: Behavior normal.        Thought Content: Thought content normal.        Judgment: Judgment normal.     ED Results / Procedures / Treatments   Labs (all labs ordered are listed, but only abnormal results are displayed) Labs Reviewed  URINALYSIS, ROUTINE W REFLEX MICROSCOPIC - Abnormal; Notable for the following components:      Result Value   Glucose, UA >=500 (*)    Hgb urine dipstick LARGE (*)    Protein, ur 30 (*)    All other components within normal limits  URINALYSIS, MICROSCOPIC (REFLEX) - Abnormal; Notable for the following components:   Bacteria, UA RARE (*)    All other components within normal limits    EKG None  Radiology No results found.  Procedures Procedures (including critical care time)  Medications Ordered in ED Medications - No data to display  ED Course  I have reviewed the triage vital signs and the nursing notes.  Pertinent labs & imaging results that were available during my care of the patient were reviewed by me and considered in my medical decision making (see chart for details).    MDM Rules/Calculators/A&P     CHA2DS2/VAS Stroke Risk Points  Current as of about an hour ago     4 >= 2 Points: High Risk  1 - 1.99 Points: Medium Risk  0 Points: Low Risk    This is the only CHA2DS2/VAS Stroke Risk Points available for the past  year.: Last Change: N/A     Details    This score determines the patient's risk of having a stroke if the  patient has atrial fibrillation.       Points Metrics  1 Has Congestive Heart Failure:  Yes  Current as of about an hour ago  0 Has Vascular Disease:  No    Current as of about an hour ago  1 Has Hypertension:  Yes    Current as of about an hour ago  1 Age:  83    Current as of about an hour ago  1 Has Diabetes:  Yes     Current as of about an hour ago  0 Had Stroke:  No  Had TIA:  No  Had thromboembolism:  No    Current as of about an hour ago  0 Male:  No    Current as of about an hour ago                          Patient Vitals for the past 24 hrs:  BP Temp Temp src Pulse Resp SpO2  08/13/19 2201 121/81 -- -- 100 (!) 22 96 %  08/13/19 2200 121/81 -- -- -- -- --  08/13/19 1948 (!) 213/142 98.6 F (37 C) Oral 95 16 98 %    10:55 PM Reevaluation with update and discussion. After initial assessment and treatment, an updated evaluation reveals he is much more comfortable after placement of urinary catheter by nursing.  He had immediate release of 1500 cc of urine after placement of the catheter.  Findings discussed and questions answered. Daleen Bo   Medical Decision Making: Ongoing urinary bladder outlet obstruction, requiring replacement of Foley catheter which was likely clogged.  No clear evidence for urinary tract infection or other compromising issue at this time.  He is stable for discharge with outpatient management.  CRITICAL CARE-no Performed by: Daleen Bo  Nursing Notes Reviewed/ Care Coordinated Applicable Imaging Reviewed Interpretation of Laboratory Data incorporated into ED treatment  The patient appears reasonably screened and/or stabilized for discharge and I doubt any other medical condition or other [facility] requiring further screening, evaluation, or treatment in the ED at this time prior to discharge.  Plan: Home Medications-continue current; Home Treatments-Foley catheter care at home; return here if the recommended treatment, does not improve the symptoms; Recommended follow up-    Final Clinical Impression(s) / ED Diagnoses Final diagnoses:  Urinary retention    Rx / DC Orders ED Discharge Orders    None       Daleen Bo, MD 08/13/19 2300

## 2019-08-13 NOTE — Discharge Instructions (Signed)
You continue to require urinary catheter.  Make sure you follow-up with your urologist as scheduled.

## 2019-08-13 NOTE — ED Notes (Signed)
EDP at Bedside 

## 2019-08-13 NOTE — Discharge Summary (Signed)
Physician Discharge Summary  MILANO ROSEVEAR TWS:[id number] DOB: 08-11-1951 DOA: 08/11/2019  PCP: [name], MD  Admit date: 08/11/2019 Discharge date: 08/13/2019  Admitted From: Home Disposition: Home  Recommendations for Outpatient Follow-up:  1. Follow up with PCP in 1-2 weeks 2. Please obtain BMP/CBC in one week 3. Patient has been discharged with [facility] catheter. 4. Patient has been discharged on Flomax regularly. 5. Advised to restart Xarelto if urine clears up. 6. Advised to discontinue Metformin and resume once kidney functions improved.  Home Health: No   Equipment/Devices: No  Discharge Condition: Stable  CODE STATUS:Full code   Diet recommendation: Heart Healthy / Carb Modified / Regular / Dysphagia   Brief/Interim Summary: Durwin Reges a 68 y.o.malewith medical history significant ofprostate CA s/p radiation + seeds, HTN, DM2, PAF, DVT, on eliquis. He presents in the ED with increased urinary frequency and constipation for past 4 days.  He went to his PCPs office where UA was positive for UTI. He was given 1 dose IM Rocephin and was given prescription for ciprofloxacin and was sent home.  He report worsening symptoms so he came to the ED He is found to have Creat 2.5 up from 1.1 in Sept. UA neg, Tmax. 99.7. CT abd/pelvis shows Bilateral hydronephrosis due to apparent obstructive uropathy with severe bladder distention. Also perinephric stranding, suggestive of ascending UTI.  Hospital course: He was admitted for UTI and for inpatient urology consult.  He was continued on ceftriaxone, leukocytosis resolved, urine culture negative, no growth so far.  Denies any urinary frequency, burning.  Urology consulted , suggest  AKI due to obstructive uropathy related to bladder outlet obstruction:  Continue indwelling catheter considering renal dysfunction.  Begin alpha blocker therapy and continue as outpatient.  Continue catheter upon discharge and ok to  discharge from urologic standpoint if renal function continuing to improve .  Renal functions improving. Will arrange f/u with Dr. Tresa Moore as an outpatient next week for possible voiding trial.  Patient was seen and examined and seems reasonable to be discharged.  Advised to hold Metformin and resume once kidney function improves.  His urinalysis appears pinkish, advised to hold Xarelto once urine clears then restart.   He was managed for below problems.  Assessment & Plan:   Principal Problem:   Obstructive uropathy Active Problems:   Type II diabetes mellitus, uncontrolled (HCC)   Prostate cancer ([place])   Essential hypertension   Paroxysmal atrial fibrillation (HCC)   Acute lower UTI   AKI (acute kidney injury) ([place])   Acute urinary retention   Bilateral hydronephrosis  # Obstructive uropathy with AKI due to acute urinary retention - - >2L UOP post foley placement, failed voiding trial - Strict intake and output - Repeat BMP in AM, renal functions improving - Hold ARB - Urology consulted,   patient can be discharged with Foley catheter. -Outpatient urology follow-up, outpatient voiding trial   # Urinary tract infection:   -CT suggestive of ascending UTI - WBC 15  Improved 12.1 - UA not very impressive (0 WBC, no LE, no nitrites). - Urine culture : No growth.  - Received ceftriaxone for 3 days. - No antibiotics at discharge  # DM2 - 1. Hold home PO hypoglycemics 2. Sensitive SSI AC/HS 3.  # HTN - 1. Hold ARB 2. Continue metoprolol and amlodipine.   # H/o prostate CA - s/p radiation and seeds  # PAF - 1. Continue BB 2. Continue Xarelto   Discharge Diagnoses:  Principal Problem:  Obstructive uropathy Active Problems:   Type II diabetes mellitus, uncontrolled (HCC)   Prostate cancer ([place])   Essential hypertension   Paroxysmal atrial fibrillation (HCC)   Acute lower UTI   AKI (acute kidney injury) ([place])   Acute urinary retention   Bilateral  hydronephrosis    Discharge Instructions  Discharge Instructions    Call MD for:  persistant dizziness or light-headedness   Complete by: As directed    Call MD for:  persistant nausea and vomiting   Complete by: As directed    Call MD for:  severe uncontrolled pain   Complete by: As directed    Call MD for:  temperature >100.4   Complete by: As directed    Diet - low sodium heart healthy   Complete by: As directed    Discharge instructions   Complete by: As directed    Advised to follow with PCP in 1 week. We will request PCP to check CBC and BMP in 1 week Advised to follow-up with urology for possible voiding trial. Patient has been discharged with [facility] catheter. Patient has been discharged on Flomax regularly. Advised to restart Xarelto if urine clears up.   Increase activity slowly   Complete by: As directed      Allergies as of 08/13/2019   No Known Allergies     Medication List    STOP taking these medications   glimepiride 1 MG tablet Commonly known as: Amaryl   Invokamet XR [phone] MG Tb24 Generic drug: Canagliflozin-metFORMIN HCl ER   rivaroxaban 20 MG Tabs tablet Commonly known as: Xarelto   Synjardy 12.12-998 MG Tabs Generic drug: Empagliflozin-metFORMIN HCl     TAKE these medications   amLODipine 2.5 MG tablet Commonly known as: NORVASC Take 1 tablet (2.5 mg total) by mouth daily.   atorvastatin 40 MG tablet Commonly known as: LIPITOR TAKE ONE TABLET BY MOUTH ONCE DAILY. What changed:   how much to take  how to take this  when to take this  additional instructions   blood glucose meter kit and supplies Kit Test blood sugar daily as directed. Dx code: E11.9   CINNAMON PO Take 1,000 mg by mouth daily.   Coral Calcium 1000 (390 Ca) MG Tabs Take 1,000 mg by mouth daily.   glucose blood test strip Commonly known as: Accu-Chek Guide Use as instructed to check blood sugar twice daily.   leuprolide 11.25 MG injection Commonly  known as: LUPRON Inject 11.25 mg into the muscle every 6 (six) months.   losartan 50 MG tablet Commonly known as: COZAAR Take 1 tablet (50 mg total) by mouth daily.   metoprolol tartrate 100 MG tablet Commonly known as: LOPRESSOR TAKE 1 TABLET BY MOUTH TWICE A DAY   Ozempic (1 MG/DOSE) 2 MG/1.5ML Sopn Generic drug: Semaglutide (1 MG/DOSE) INJECT 1MG UNDER THE SKIN ONCE WEEKLY. What changed:   See the new instructions.  Another medication with the same name was removed. Continue taking this medication, and follow the directions you see here.   pantoprazole 40 MG tablet Commonly known as: PROTONIX Take 1 tablet (40 mg total) by mouth daily.   tamsulosin 0.4 MG Caps capsule Commonly known as: FLOMAX Take 1 capsule (0.4 mg total) by mouth daily. Start taking on: August 14, 2019   Turmeric 450 MG Caps Take 450 mg by mouth daily after breakfast.   Vitamin D (Cholecalciferol) 10 MCG (400 UNIT) Tabs Take 400 Units by mouth daily.   ZINC PO Take 1 tablet by  mouth daily.      Follow-up Information    Alexis Frock, MD Follow up.   Specialty: Urology Why: Please call to schedule appointment for next week. Contact information: [place] [address] [phone]        [name], MD Follow up in 1 week(s).   Specialties: Internal Medicine, Radiology Contact information: [place] [address] [phone]          No Known Allergies  Consultations:  Urology    Procedures/Studies: CT ABDOMEN PELVIS WO CONTRAST  Result Date: 08/11/2019 CLINICAL DATA:  Acute, generalized abdominal pain, urinary frequency and constipation for 4 days, history of prostate cancer EXAM: CT ABDOMEN AND PELVIS WITHOUT CONTRAST TECHNIQUE: Multidetector CT imaging of the abdomen and pelvis was performed following the standard protocol without IV contrast. COMPARISON:  PET-CT Jan 05, 2018 FINDINGS: Lower chest: Lung bases are clear. Normal heart  size. No pericardial effusion. Atherosclerotic calcification of the coronary arteries. Hepatobiliary: No focal liver abnormality is seen. No gallstones, gallbladder wall thickening, or biliary dilatation. Pancreas: Unremarkable. No pancreatic ductal dilatation or surrounding inflammatory changes. Spleen: Normal in size without focal abnormality. Adrenals/Urinary Tract: Normal adrenal glands. Nonobstructing calculus is seen in lower pole left kidney. Stable lower pole left renal cyst measuring 4.6 cm in size. Marked interval worsening of bilateral perinephric stranding with moderate bilateral hydroureteronephrosis without obstructive urolithiasis. The bladder is also markedly distended with circumferential mural thickening and perivesicular hazy stranding. No bladder or visible urethral calcifications. Stomach/Bowel: Small hiatal hernia. Distal esophagus, stomach and duodenal sweep are unremarkable. No small bowel wall thickening or dilatation. No evidence of obstruction. A normal appendix is visualized. Vascular/Lymphatic: Atherosclerotic plaque within the normal caliber aorta. Previously seen FDG avid left iliac lymph node on comparison PET-CT is no longer readily apparent on today's examination. No suspicious or enlarged lymph nodes in the included lymphatic chains. Some mild subcentimeter retroperitoneal adenopathy is likely related to the inflammatory/infectious process in the kidneys. Reproductive: Diminutive size of the prostate with some coarse calcification. Few metallic densities may reflect fiducials or brachytherapy seeds. Other: Reactive fluid in the retroperitoneum. No intraperitoneal free fluid or air. Musculoskeletal: No worrisome sclerotic or lytic lesions in the osseous structures. Multilevel degenerative changes are present in the imaged portions of the spine. IMPRESSION: 1. Bilateral perinephric stranding with moderate hydroureteronephrosis without obstructive urolithiasis. The bladder is also  markedly distended with circumferential mural thickening and perivesicular hazy stranding. Findings are concerning for urinary retention with ascending urinary tract infection. 2. Postprocedural changes noted at the prostate, similar to comparison is. 3. Previously seen FDG avid left iliac lymph node on comparison PET-CT is no longer readily apparent on today's examination. No suspicious or enlarged lymph nodes in the included lymphatic chains. 4. Small hiatal hernia. 5. Aortic Atherosclerosis (ICD10-I70.0). Electronically Signed   By: Lovena Le M.D.   On: 08/11/2019 21:39     Subjective:   Patient was seen and examined and seems reasonable to be discharged.  Denies any pain.  Discharge Exam: Vitals:   08/12/19 2137 08/13/19 0611  BP: 127/71 102/79  Pulse: 87 97  Resp: (!) 21 18  Temp: 98.4 F (36.9 C) 98.5 F (36.9 C)  SpO2: 94% 96%   Vitals:   08/12/19 0346 08/12/19 0605 08/12/19 2137 08/13/19 0611  BP: (!) 122/93 (!) 151/73 127/71 102/79  Pulse: 82 (!) 105 87 97  Resp: 18 18 (!) 21 18  Temp: 98.4 F (36.9 C) 98.1  F (36.7 C) 98.4 F (36.9 C) 98.5 F (36.9 C)  TempSrc: Oral Oral Oral Oral  SpO2:  97% 94% 96%  Weight: (!) 146.5 kg     Height: 5' 10.98" (1.803 m)       General: Pt is alert, awake, not in acute distress Cardiovascular: RRR, S1/S2 +, no rubs, no gallops Respiratory: CTA bilaterally, no wheezing, no rhonchi Abdominal: Soft, NT, ND, bowel sounds + Extremities: no edema, no cyanosis    The results of significant diagnostics from this hospitalization (including imaging, microbiology, ancillary and laboratory) are listed below for reference.     Microbiology: Recent Results (from the past 240 hour(s))  Urine Culture     Status: None   Collection Time: 08/10/19 10:04 AM   Specimen: Urine  Result Value Ref Range Status   MICRO NUMBER: [id number]  Final   SPECIMEN QUALITY: Adequate  Final   Sample Source NOT GIVEN  Final   STATUS: FINAL  Final   Result:  No Growth  Final  SARS CORONAVIRUS 2 (TAT 6-24 HRS) Nasopharyngeal Nasopharyngeal Swab     Status: None   Collection Time: 08/11/19 11:08 PM   Specimen: Nasopharyngeal Swab  Result Value Ref Range Status   SARS Coronavirus 2 NEGATIVE NEGATIVE Final    Comment: (NOTE) SARS-CoV-2 target nucleic acids are NOT DETECTED. The SARS-CoV-2 RNA is generally detectable in upper and lower respiratory specimens during the acute phase of infection. Negative results do not preclude SARS-CoV-2 infection, do not rule out co-infections with other pathogens, and should not be used as the sole basis for treatment or other patient management decisions. Negative results must be combined with clinical observations, patient history, and epidemiological information. The expected result is Negative. Fact Sheet for Patients: SugarRoll.be Fact Sheet for Healthcare Providers: https://www.woods-mathews.com/ This test is not yet approved or cleared by the [place] FDA and  has been authorized for detection and/or diagnosis of SARS-CoV-2 by FDA under an Emergency Use Authorization (EUA). This EUA will remain  in effect (meaning this test can be used) for the duration of the COVID-19 declaration under Section 56 4(b)(1) of the Act, 21 U.S.C. section 360bbb-3(b)(1), unless the authorization is terminated or revoked sooner. Performed at [facility], [place] [address] [place], [place] [address]   Culture, Urine     Status: None   Collection Time: 08/11/19 11:59 PM   Specimen: Urine, Clean Catch  Result Value Ref Range Status   Specimen Description   Final    Urine Performed at [place] [address], [place], [place] [address]    Special Requests   Final    NONE Performed at [facility], [place] [address], [place], [place] [address]    Culture   Final    NO GROWTH Performed at [facility], [place] [address]  [place], [place] [address]    Report Status 08/13/2019 FINAL  Final     Labs: BNP (last 3 results) No results for input(s): BNP in the last 8760 hours. Basic Metabolic Panel: Recent Labs  Lab 08/11/19 1649 08/12/19 0556 08/13/19 0545  NA 139 143 140  K 4.7 4.6 4.4  CL 103 107 104  CO2 _0 GLUCOSE 175* 125* 128*  BUN 46* 47* 45*  CREATININE 2.52* 2.28* 2.01*  CALCIUM 9.2 8.9 8.9   Liver Function Tests: Recent Labs  Lab 08/11/19 1649  AST 21  ALT 15  ALKPHOS 82  BILITOT 1.5*  PROT 7.7  ALBUMIN 3.7   Recent Labs  Lab 08/11/19 1649  LIPASE 25   No results for input(s): AMMONIA in the last 168 hours. CBC: Recent Labs  Lab 08/11/19 1649 08/12/19 0556 08/13/19 0545  WBC 15.1* 12.4* 12.1*  HGB 13.7 13.4 13.4  HCT 43.3 43.4 42.9  MCV 86.6 87.9 87.7  PLT 221 199 203   Cardiac Enzymes: No results for input(s): CKTOTAL, CKMB, CKMBINDEX, TROPONINI in the last 168 hours. BNP: Invalid input(s): POCBNP CBG: Recent Labs  Lab 08/12/19 0742 08/12/19 1150 08/12/19 1650 08/12/19 2134 08/13/19 0754  GLUCAP 109* 190* 131* 135* 105*   D-Dimer No results for input(s): DDIMER in the last 72 hours. Hgb A1c Recent Labs    08/11/19 2327  HGBA1C 7.3*   Lipid Profile No results for input(s): CHOL, HDL, LDLCALC, TRIG, CHOLHDL, LDLDIRECT in the last 72 hours. Thyroid function studies No results for input(s): TSH, T4TOTAL, T3FREE, THYROIDAB in the last 72 hours.  Invalid input(s): FREET3 Anemia work up No results for input(s): VITAMINB12, FOLATE, FERRITIN, TIBC, IRON, RETICCTPCT in the last 72 hours. Urinalysis    Component Value Date/Time   COLORURINE YELLOW 08/11/2019 1543   APPEARANCEUR CLEAR 08/11/2019 1543   LABSPEC 1.011 08/11/2019 1543   PHURINE 5.0 08/11/2019 1543   GLUCOSEU >=500 (A) 08/11/2019 1543   GLUCOSEU >=1000 (A) 08/10/2019 1004   HGBUR MODERATE (A) 08/11/2019 1543   BILIRUBINUR NEGATIVE 08/11/2019 1543   BILIRUBINUR neg 12/14/2013 0810    KETONESUR NEGATIVE 08/11/2019 1543   PROTEINUR NEGATIVE 08/11/2019 1543   UROBILINOGEN 0.2 08/10/2019 1004   NITRITE NEGATIVE 08/11/2019 1543   LEUKOCYTESUR NEGATIVE 08/11/2019 1543   Sepsis Labs Invalid input(s): PROCALCITONIN,  WBC,  LACTICIDVEN Microbiology Recent Results (from the past 240 hour(s))  Urine Culture     Status: None   Collection Time: 08/10/19 10:04 AM   Specimen: Urine  Result Value Ref Range Status   MICRO NUMBER: [id number]  Final   SPECIMEN QUALITY: Adequate  Final   Sample Source NOT GIVEN  Final   STATUS: FINAL  Final   Result: No Growth  Final  SARS CORONAVIRUS 2 (TAT 6-24 HRS) Nasopharyngeal Nasopharyngeal Swab     Status: None   Collection Time: 08/11/19 11:08 PM   Specimen: Nasopharyngeal Swab  Result Value Ref Range Status   SARS Coronavirus 2 NEGATIVE NEGATIVE Final    Comment: (NOTE) SARS-CoV-2 target nucleic acids are NOT DETECTED. The SARS-CoV-2 RNA is generally detectable in upper and lower respiratory specimens during the acute phase of infection. Negative results do not preclude SARS-CoV-2 infection, do not rule out co-infections with other pathogens, and should not be used as the sole basis for treatment or other patient management decisions. Negative results must be combined with clinical observations, patient history, and epidemiological information. The expected result is Negative. Fact Sheet for Patients: SugarRoll.be Fact Sheet for Healthcare Providers: https://www.woods-mathews.com/ This test is not yet approved or cleared by the [place] FDA and  has been authorized for detection and/or diagnosis of SARS-CoV-2 by FDA under an Emergency Use Authorization (EUA). This EUA will remain  in effect (meaning this test can be used) for the duration of the COVID-19 declaration under Section 56 4(b)(1) of the Act, 21 U.S.C. section 360bbb-3(b)(1), unless the authorization is terminated or revoked  sooner. Performed at [facility], [place] [address] [place], [place] [address]   Culture, Urine     Status: None   Collection Time: 08/11/19 11:59 PM  Specimen: Urine, Clean Catch  Result Value Ref Range Status   Specimen Description   Final    Urine Performed at [facility] [address], [place], [place] [address]    Special Requests   Final    NONE Performed at [facility], [place] [address] [place], [place] [address]    Culture   Final    NO GROWTH Performed at [facility], [place] [address], [place], [place] [address]    Report Status 08/13/2019 FINAL  Final     Time coordinating discharge: Over 30 minutes  SIGNED:   Shawna Clamp, MD  [facility] 08/13/2019, 11:51 AM Pager   If 7PM-7AM, please contact night-coverage [email or web address]

## 2019-08-15 ENCOUNTER — Telehealth: Payer: Self-pay

## 2019-08-15 NOTE — Telephone Encounter (Signed)
Transition Care Management Follow-up Telephone Call   Date discharged? 08/13/2019  How have you been since you were released from the hospital? Pt is in pain from the catheter  Do you understand why you were in the hospital? Yes  Do you understand the discharge instructions? Yes  Where were you discharged to? Home  Items Reviewed:  Medications reviewed: Yes  Allergies reviewed: Yes  Dietary changes reviewed: Yes  Referrals reviewed: Yes   Functional Questionnaire:   Activities of Daily Living (ADLs):   States they are independent in the following: All ADL's  States they require assistance with the following: n/a  Any transportation issues/concerns?: NO  Any patient concerns? Cost of retesting blood, pt declined follow up with PCP.   Confirmed importance and date/time of follow-up visits scheduled: YES  Provider Appointment booked with - pt declined follow up with PCP.   Confirmed with patient if condition begins to worsen call PCP or go to the ER: Yes Patient was given the office number and encouraged to call back with question or concerns: YES

## 2019-08-18 ENCOUNTER — Other Ambulatory Visit: Payer: Self-pay | Admitting: Endocrinology

## 2019-08-19 DIAGNOSIS — R3915 Urgency of urination: Secondary | ICD-10-CM | POA: Diagnosis not present

## 2019-08-22 ENCOUNTER — Telehealth: Payer: Self-pay | Admitting: Internal Medicine

## 2019-08-22 NOTE — Telephone Encounter (Signed)
Pt called and is requesting to have lasix sent in for him. Pt states PCP has never prescribed this for him before. Pt advised he may have to set up an  appt he is requesting a call back from PCP nurse. Please advise.     [id number] - [place], [place] [address]  Phone: [phone] Fax: [phone]  Not a 24 hour pharmacy; exact hours not known.

## 2019-08-23 NOTE — Telephone Encounter (Signed)
Please ask pt to make ROV for follow up recent hospn and swelling

## 2019-08-23 NOTE — Telephone Encounter (Signed)
Called pt to clarify msg.. Pt states he has a catheter in and is limited w/his mobility. He has develop some edema in his ankles. He is wanting to get [facility] to help w/swelling in ankles.Marland KitchenJohny Chess

## 2019-08-23 NOTE — Telephone Encounter (Signed)
Notified pt w/MD response. Pt states he is not able to come in but would do an virtual. He states it is hard for him to get in and out of the car. Made appt for 08/25/19 @ 8:20.Marland KitchenJohny Yang

## 2019-08-24 ENCOUNTER — Ambulatory Visit: Payer: BC Managed Care – PPO | Admitting: Internal Medicine

## 2019-08-25 ENCOUNTER — Ambulatory Visit: Payer: BC Managed Care – PPO | Admitting: Internal Medicine

## 2019-09-01 DIAGNOSIS — R338 Other retention of urine: Secondary | ICD-10-CM | POA: Diagnosis not present

## 2019-09-01 DIAGNOSIS — C778 Secondary and unspecified malignant neoplasm of lymph nodes of multiple regions: Secondary | ICD-10-CM | POA: Diagnosis not present

## 2019-09-01 DIAGNOSIS — C61 Malignant neoplasm of prostate: Secondary | ICD-10-CM | POA: Diagnosis not present

## 2019-09-05 ENCOUNTER — Ambulatory Visit (HOSPITAL_COMMUNITY)
Admission: RE | Admit: 2019-09-05 | Discharge: 2019-09-05 | Disposition: A | Discharge status: Home / Self Care | Payer: BC Managed Care – PPO | Source: Ambulatory Visit | Attending: Nurse Practitioner | Admitting: Nurse Practitioner

## 2019-09-05 ENCOUNTER — Other Ambulatory Visit: Payer: Self-pay

## 2019-09-05 ENCOUNTER — Encounter (HOSPITAL_COMMUNITY): Payer: Self-pay | Admitting: Nurse Practitioner

## 2019-09-05 VITALS — BP 124/70 | HR 86 | Ht 70.0 in | Wt 306.2 lb

## 2019-09-05 DIAGNOSIS — I4811 Longstanding persistent atrial fibrillation: Secondary | ICD-10-CM | POA: Insufficient documentation

## 2019-09-05 DIAGNOSIS — E7800 Pure hypercholesterolemia, unspecified: Secondary | ICD-10-CM | POA: Insufficient documentation

## 2019-09-05 DIAGNOSIS — E119 Type 2 diabetes mellitus without complications: Secondary | ICD-10-CM | POA: Diagnosis not present

## 2019-09-05 DIAGNOSIS — I4819 Other persistent atrial fibrillation: Secondary | ICD-10-CM

## 2019-09-05 DIAGNOSIS — Z79899 Other long term (current) drug therapy: Secondary | ICD-10-CM | POA: Diagnosis not present

## 2019-09-05 DIAGNOSIS — K219 Gastro-esophageal reflux disease without esophagitis: Secondary | ICD-10-CM | POA: Insufficient documentation

## 2019-09-05 DIAGNOSIS — Z86718 Personal history of other venous thrombosis and embolism: Secondary | ICD-10-CM | POA: Insufficient documentation

## 2019-09-05 DIAGNOSIS — Z8546 Personal history of malignant neoplasm of prostate: Secondary | ICD-10-CM | POA: Diagnosis not present

## 2019-09-05 DIAGNOSIS — D6869 Other thrombophilia: Secondary | ICD-10-CM | POA: Diagnosis not present

## 2019-09-05 DIAGNOSIS — I10 Essential (primary) hypertension: Secondary | ICD-10-CM | POA: Diagnosis not present

## 2019-09-05 DIAGNOSIS — Z87891 Personal history of nicotine dependence: Secondary | ICD-10-CM | POA: Diagnosis not present

## 2019-09-05 NOTE — Progress Notes (Signed)
Date:  09/05/2019   ID:  Tommy Yang, DOB 12-May-1951, MRN [id number]  Location: home   Provider location: [address] [place], [place] [address] Evaluation Performed:Follow up   PCP:  [name], MD  Primary Cardiologist:  None  Primary Electrophysiologist: none   BE:EFEOFH fibrillation   History of Present Illness: Tommy Yang is a 69 y.o. male who presents for f/u today. He has longstanding persistent afib. He feels he may have had afib for 2 years befor he was diagnosed.  On previous visits, he has been offered AAD therapy which he has deferred as he feels he is minimally symptomatic. He does have some snoring. Possible apnea, but  pt states he would not be able to deal with a mask to treat sleep apnea. He scratched  his lower leg and being diabetic, has an appointment with the wound center to make sure it is healing.  6 month f/u in afib clinic, 09/03/19. He is in rate controlled afib. He reports going to ER  In September after the flu shot with afib with RVR. He was rate controlled with IV Cardizem and discharged home on same meds. He is having issues now with urinary retention with h/o prostate CA. He is wearing a urinary catheter. He is due to go back to urologist tomorrow for removal but if he can not urinate , he was told he will have to have surgery. He is having intermittent hematuria now. His prior leg wound did heal. Continues on xarelto with a CHA2DS2VASc score of 5.he is on tumeric and fish oil which may be contributing to hematuria.  Today, he denies symptoms of palpitations, chest pain, shortness of breath, orthopnea, PND, lower extremity edema, claudication, dizziness, presyncope, syncope, bleeding, or neurologic sequela. The patient is tolerating medications without difficulties and is otherwise without complaint today.   he denies symptoms of cough, fevers, chills, or new SOB worrisome for COVID 19.    Filed Weights   09/05/19 0840  Weight: (!) 138.9  kg    Past Medical History:  Diagnosis Date  . Atrial fibrillation ([place])   . Diabetes mellitus without complication ([place])   . DVT (deep venous thrombosis) ([facility])    left leg.  [name] GERD (gastroesophageal reflux disease)   . Hypercholesterolemia   . Hypertension   . Prostate cancer ([place]) 12/06/13   gleason 4+3=7, 6/12 cores positive. seed implant and radiation   Past Surgical History:  Procedure Laterality Date  . APPENDECTOMY    . BALLOON DILATION N/A 08/02/2015   Procedure: BALLOON DILATION;  Surgeon: [name], MD;  Location: [facility];  Service: Endoscopy;  Laterality: N/A;  . CARDIOVERSION N/A 05/13/2018   Procedure: CARDIOVERSION;  Surgeon: Sanda Klein, MD;  Location: [facility];  Service: Cardiovascular;  Laterality: N/A;  . CARDIOVERSION N/A 05/18/2018   Procedure: CARDIOVERSION;  Surgeon: Buford Dresser, MD;  Location: [facility];  Service: Cardiovascular;  Laterality: N/A;  . COLONOSCOPY WITH PROPOFOL N/A 08/02/2015   Procedure: COLONOSCOPY WITH PROPOFOL w/ APC;  Surgeon: [name], MD;  Location: [facility];  Service: Endoscopy;  Laterality: N/A;  . ESOPHAGOGASTRODUODENOSCOPY (EGD) WITH PROPOFOL N/A 08/02/2015   Procedure: ESOPHAGOGASTRODUODENOSCOPY (EGD) WITH PROPOFOL/ possible dilation.;  Surgeon: [name], MD;  Location: [facility];  Service: Endoscopy;  Laterality: N/A;  . KNEE SURGERY     right knee orthoscopic  . PROSTATE BIOPSY  12/06/13   Gleason 4+3=7, volume 35 gm  . SPINE SURGERY  [place]  . TONSILLECTOMY       Current Outpatient Medications  Medication Sig Dispense Refill  . amLODipine (NORVASC) 2.5 MG tablet Take 1 tablet (2.5 mg total) by mouth daily. 90 tablet 1  . atorvastatin (LIPITOR) 40 MG tablet TAKE ONE TABLET BY MOUTH ONCE DAILY. (Patient taking differently: Take 40 mg by mouth daily after breakfast. ) 90 tablet 1  . blood glucose meter kit and supplies KIT Test blood sugar daily as  directed. Dx code: E11.9 1 each 0  . CINNAMON PO Take 1,000 mg by mouth daily.    [name] Coral Calcium 1000 (390 Ca) MG TABS Take 1,000 mg by mouth daily.    [name] glucose blood (ACCU-CHEK GUIDE) test strip Use as instructed to check blood sugar twice daily. 100 each 3  . INVOKAMET XR [phone] MG TB24 daily. Taking 2 tablets in the am    . leuprolide (LUPRON) 11.25 MG injection Inject 11.25 mg into the muscle every 6 (six) months.     [name] losartan (COZAAR) 50 MG tablet Take 1 tablet (50 mg total) by mouth daily. 90 tablet 1  . metoprolol tartrate (LOPRESSOR) 100 MG tablet TAKE 1 TABLET BY MOUTH TWICE A DAY 180 tablet 2  . Multiple Vitamins-Minerals (ZINC PO) Take 1 tablet by mouth daily.    [name] OZEMPIC, 1 MG/DOSE, 2 MG/1.5ML SOPN INJECT 1MG UNDER THE SKIN ONCE WEEKLY. (Patient taking differently: Inject 1 mg into the skin once a week. ) 9 pen 1  . pantoprazole (PROTONIX) 40 MG tablet Take 1 tablet (40 mg total) by mouth daily. 90 tablet 3  . tamsulosin (FLOMAX) 0.4 MG CAPS capsule Take 1 capsule (0.4 mg total) by mouth daily. (Patient taking differently: Take 0.4 mg by mouth 2 (two) times daily. ) 30 capsule 30  . Turmeric 450 MG CAPS Take 450 mg by mouth daily after breakfast.     . Vitamin D, Cholecalciferol, 400 UNITS TABS Take 400 Units by mouth daily.     No current facility-administered medications for this encounter.    Allergies:   Patient has no known allergies.   Social History:  The patient  reports that he quit smoking about 7 years ago. His smoking use included cigarettes. He has a 11.50 pack-year smoking history. He has never used smokeless tobacco. He reports current alcohol use. He reports that he does not use drugs.   Family History:  The patient's  family history includes COPD in his brother; Cancer in his brother; Diabetes in his brother, father, and mother; Heart disease in his father and mother; Hyperlipidemia in his brother, father, and mother; Hypertension in his brother, father, and  mother; Varicose Veins in his mother.    ROS:  Please see the history of present illness.   All other systems are personally reviewed and negative.   Exam:General:  No distress Eyes: EOMI, anicteric ENT: Oral Mucosa clear and moist Cardiovascular: Normal rate, irregularly irregular rhythm, no murmurs, rubs or gallops, no edema, Respiratory: Normal respiratory effort on room air, lungs clear to auscultation bilaterally Abdomen: soft, non-distended, non-tender, normal bowel sounds. Wearing a urinary catheter with blood tinged urine. Skin: No Rash Neurologic: Grossly no focal neuro deficit.Mental status AAOx3, speech normal, Psychiatric:Appropriate affect, and mood   Recent Labs: 05/23/2019: Magnesium 2.1; TSH 0.770 08/11/2019: ALT 15 08/13/2019: BUN 45; Creatinine, Ser 2.01; Hemoglobin 13.4; Platelets 203; Potassium 4.4; Sodium 140  personally reviewed    Other studies personally reviewed: Epic records reviewed  ASSESSMENT AND PLAN:  1.  Longstanding Persistent  atrial fibrillation  Minimally symptomatic  He is well rate controlled  Continue current rate control without change  Pt  deferred  AAD therapy Continue xarelto 20 mg daily  This patients CHA2DS2-VASc Score and unadjusted Ischemic Stroke Rate (% per year) is equal to 7.2 % stroke rate/year from a score of 5 Will update echo in August to assess if there is any decline in EF with ongoing afib  I would recommend to reduce incidence of bleeding with urinary issues to hold tumeric and fish oil  for now   Follow-up:  6 months  Current medicines are reviewed at length with the patient today.   The patient does not have concerns regarding his medicines.  The following changes were made today:  none  Labs/ tests ordered today include:none Orders Placed This Encounter  Procedures  . EKG 12-Lead    Patient Risk:  after full review of this patients clinical status, I feel that they are at moderate risk at this  time.  Signed, Roderic Palau, NP 09/05/2019 8:42 AM  [facility] [address] [place], [place] [address] [phone]

## 2019-09-06 ENCOUNTER — Emergency Department (HOSPITAL_COMMUNITY)
Admission: EM | Admit: 2019-09-06 | Discharge: 2019-09-06 | Disposition: A | Discharge status: Home / Self Care | Payer: BC Managed Care – PPO | Attending: Emergency Medicine | Admitting: Emergency Medicine

## 2019-09-06 ENCOUNTER — Encounter (HOSPITAL_COMMUNITY): Payer: Self-pay | Admitting: *Deleted

## 2019-09-06 ENCOUNTER — Other Ambulatory Visit: Payer: Self-pay

## 2019-09-06 DIAGNOSIS — R339 Retention of urine, unspecified: Secondary | ICD-10-CM

## 2019-09-06 DIAGNOSIS — Z87891 Personal history of nicotine dependence: Secondary | ICD-10-CM | POA: Diagnosis not present

## 2019-09-06 DIAGNOSIS — Z7901 Long term (current) use of anticoagulants: Secondary | ICD-10-CM | POA: Diagnosis not present

## 2019-09-06 DIAGNOSIS — E119 Type 2 diabetes mellitus without complications: Secondary | ICD-10-CM | POA: Insufficient documentation

## 2019-09-06 DIAGNOSIS — Z79899 Other long term (current) drug therapy: Secondary | ICD-10-CM | POA: Insufficient documentation

## 2019-09-06 DIAGNOSIS — I10 Essential (primary) hypertension: Secondary | ICD-10-CM | POA: Insufficient documentation

## 2019-09-06 DIAGNOSIS — R338 Other retention of urine: Secondary | ICD-10-CM | POA: Diagnosis not present

## 2019-09-06 DIAGNOSIS — R1030 Lower abdominal pain, unspecified: Secondary | ICD-10-CM

## 2019-09-06 LAB — URINALYSIS, ROUTINE W REFLEX MICROSCOPIC
Bilirubin Urine: NEGATIVE
Glucose, UA: >=500 mg/dL — AB
Ketones, ur: NEGATIVE mg/dL
Leukocytes,Ua: NEGATIVE
Nitrite: NEGATIVE
Protein, ur: 30 mg/dL — AB
Specific Gravity, Urine: 1.018 (ref 1.005–1.030)
pH: 5 (ref 5.0–8.0)

## 2019-09-06 NOTE — Discharge Instructions (Signed)
Your history and exam today are consistent with current urinary retention requiring replacement of your Foley catheter.  After catheter was placed, your symptoms improved and a large amount of urine was drained.  The urinalysis does not show infection but culture was still sent.  Please follow-up with your urologist tomorrow for further management.  If any symptoms change or worsen, please return to nearest emergency department.

## 2019-09-06 NOTE — ED Provider Notes (Signed)
[facility] DEPT Provider Note   CSN: [id number] Arrival date & time: 09/06/19  1655     History Chief Complaint  Patient presents with  . Dysuria    Tommy Yang is a 69 y.o. male.  The history is provided by the patient and medical records. No language interpreter was used.  Abdominal Pain Pain location:  Suprapubic Pain quality: aching and bloating   Pain radiates to:  Does not radiate Pain severity:  Moderate Onset quality:  Gradual Duration:  1 day Timing:  Constant Progression:  Worsening Chronicity:  Recurrent Relieved by:  Nothing Worsened by:  Nothing Ineffective treatments:  None tried Associated symptoms: no chest pain, no chills, no constipation, no cough, no diarrhea, no dysuria, no fatigue, no fever, no nausea, no shortness of breath and no vomiting        Past Medical History:  Diagnosis Date  . Atrial fibrillation ([place])   . Diabetes mellitus without complication ([place])   . DVT (deep venous thrombosis) ([facility])    left leg.  [name] GERD (gastroesophageal reflux disease)   . Hypercholesterolemia   . Hypertension   . Prostate cancer ([place]) 12/06/13   gleason 4+3=7, 6/12 cores positive. seed implant and radiation    Patient Active Problem List   Diagnosis Date Noted  . Bilateral hydronephrosis 08/12/2019  . Obstructive uropathy 08/11/2019  . Acute lower UTI 08/11/2019  . AKI (acute kidney injury) ([place]) 08/11/2019  . Acute urinary retention 08/11/2019  . Dysuria 08/10/2019  . Constipation 08/10/2019  . Low back pain 08/10/2019  . Paroxysmal atrial fibrillation with RVR ([place]) 05/23/2019  . Paroxysmal atrial fibrillation ([place]) 05/23/2019  . Encounter for well adult exam with abnormal findings 02/24/2019  . Leg wound, right 02/24/2019  . Essential hypertension 09/24/2018  . Persistent atrial fibrillation   . Microalbuminuria 04/23/2016  . Atrial fibrillation ([place]) 12/29/2015  . Rectal bleeding 07/18/2015  . Dysphagia  07/18/2015  . Chronic anticoagulation 03/14/2015  . Morbid obesity ([place]) 03/14/2015  . Exertional shortness of breath 01/12/2015  . Abnormal ECG 01/12/2015  . Encounter for screening colonoscopy 05/09/2014  . Esophageal reflux 05/09/2014  . Other dysphagia 05/09/2014  . Evaluate for OSA (obstructive sleep apnea) 02/28/2014  . Hyperlipidemia 02/28/2014  . Prostate cancer ([place]) 12/14/2013  . Erectile dysfunction 10/26/2013  . Type II diabetes mellitus, uncontrolled ([place]) 10/19/2013  . DVT (deep venous thrombosis) ([place])   . Left leg DVT ([place]) 09/28/2013  . Leg edema, left 09/28/2013    Past Surgical History:  Procedure Laterality Date  . APPENDECTOMY    . BALLOON DILATION N/A 08/02/2015   Procedure: BALLOON DILATION;  Surgeon: [name], MD;  Location: [facility];  Service: Endoscopy;  Laterality: N/A;  . CARDIOVERSION N/A 05/13/2018   Procedure: CARDIOVERSION;  Surgeon: Sanda Klein, MD;  Location: [facility];  Service: Cardiovascular;  Laterality: N/A;  . CARDIOVERSION N/A 05/18/2018   Procedure: CARDIOVERSION;  Surgeon: Buford Dresser, MD;  Location: [facility];  Service: Cardiovascular;  Laterality: N/A;  . COLONOSCOPY WITH PROPOFOL N/A 08/02/2015   Procedure: COLONOSCOPY WITH PROPOFOL w/ APC;  Surgeon: [name], MD;  Location: [facility];  Service: Endoscopy;  Laterality: N/A;  . ESOPHAGOGASTRODUODENOSCOPY (EGD) WITH PROPOFOL N/A 08/02/2015   Procedure: ESOPHAGOGASTRODUODENOSCOPY (EGD) WITH PROPOFOL/ possible dilation.;  Surgeon: [name], MD;  Location[facility];  Service: Endoscopy;  Laterality: N/A;  . KNEE SURGERY     right knee orthoscopic  . PROSTATE BIOPSY  12/06/13  Gleason 4+3=7, volume 35 gm  . [place]  . TONSILLECTOMY         Family History  Problem Relation Age of Onset  . Heart disease Mother        before age 72  . Diabetes Mother   . Hyperlipidemia Mother   . Varicose Veins  Mother   . Hypertension Mother   . Heart disease Father   . Diabetes Father   . Hyperlipidemia Father   . Hypertension Father   . Cancer Brother   . COPD Brother   . Diabetes Brother   . Hyperlipidemia Brother   . Hypertension Brother     Social History   Tobacco Use  . Smoking status: Former Smoker    Packs/day: 0.50    Years: 23.00    Pack years: 11.50    Types: Cigarettes    Quit date: 08/01/2012    Years since quitting: 7.1  . Smokeless tobacco: Never Used  Substance Use Topics  . Alcohol use: Yes    Alcohol/week: 0.0 standard drinks    Comment: very seldom, maybe 3x per year  . Drug use: No    Home Medications Prior to Admission medications   Medication Sig Start Date End Date Taking? Authorizing Provider  amLODipine (NORVASC) 2.5 MG tablet Take 1 tablet (2.5 mg total) by mouth daily. 02/24/19   [name], MD  atorvastatin (LIPITOR) 40 MG tablet TAKE ONE TABLET BY MOUTH ONCE DAILY. Patient taking differently: Take 40 mg by mouth daily after breakfast.  02/24/19   [name], MD  blood glucose meter kit and supplies KIT Test blood sugar daily as directed. Dx code: E11.9 01/23/15   Darlyne Russian, MD  CINNAMON PO Take 1,000 mg by mouth daily.    [provider]  Coral Calcium 1000 (390 Ca) MG TABS Take 1,000 mg by mouth daily.    [provider]  glucose blood (ACCU-CHEK GUIDE) test strip Use as instructed to check blood sugar twice daily. 10/29/18   Elayne Snare, MD  INVOKAMET XR [phone] MG TB24 daily. Taking 2 tablets in the am 08/15/19   [provider]  leuprolide (LUPRON) 11.25 MG injection Inject 11.25 mg into the muscle every 6 (six) months.     [provider]  losartan (COZAAR) 50 MG tablet Take 1 tablet (50 mg total) by mouth daily. 05/16/19   [name], MD  metoprolol tartrate (LOPRESSOR) 100 MG tablet TAKE 1 TABLET BY MOUTH TWICE A DAY 08/03/19   Sherran Needs, NP  Multiple Vitamins-Minerals (ZINC PO) Take 1 tablet  by mouth daily.    [provider]  OZEMPIC, 1 MG/DOSE, 2 MG/1.5ML SOPN INJECT 1MG UNDER THE SKIN ONCE WEEKLY. Patient taking differently: Inject 1 mg into the skin once a week.  06/28/19   Elayne Snare, MD  pantoprazole (PROTONIX) 40 MG tablet Take 1 tablet (40 mg total) by mouth daily. 02/24/19   [name], MD  rivaroxaban (XARELTO) 20 MG TABS tablet Take 20 mg by mouth daily before breakfast.    [provider]  tamsulosin (FLOMAX) 0.4 MG CAPS capsule Take 1 capsule (0.4 mg total) by mouth daily. Patient taking differently: Take 0.4 mg by mouth 2 (two) times daily.  08/14/19   Shawna Clamp, MD  Turmeric 450 MG CAPS Take 450 mg by mouth daily after breakfast.     [provider]  Vitamin D, Cholecalciferol, 400  UNITS TABS Take 400 Units by mouth daily.    [provider]    Allergies    Patient has no known allergies.  Review of Systems   Review of Systems  Constitutional: Negative for chills, diaphoresis, fatigue and fever.  HENT: Negative for congestion.   Respiratory: Negative for cough, chest tightness, shortness of breath and wheezing.   Cardiovascular: Negative for chest pain.  Gastrointestinal: Positive for abdominal pain. Negative for constipation, diarrhea, nausea and vomiting.  Genitourinary: Positive for decreased urine volume and difficulty urinating. Negative for dysuria, flank pain and frequency.  Musculoskeletal: Negative for back pain, neck pain and neck stiffness.  Psychiatric/Behavioral: Negative for agitation.  All other systems reviewed and are negative.   Physical Exam Updated Vital Signs BP (!) 151/84 (BP Location: Left Arm)   Pulse 65   Temp 98.6 F (37 C) (Oral)   Resp 18   Ht '5\' 11"'  (1.803 m)   Wt 133.4 kg   SpO2 99%   BMI 41.00 kg/m   Physical Exam Vitals and nursing note reviewed.  Constitutional:      General: He is not in acute distress.    Appearance: He is well-developed. He is not ill-appearing,  toxic-appearing or diaphoretic.  HENT:     Head: Normocephalic and atraumatic.  Eyes:     Conjunctiva/sclera: Conjunctivae normal.  Cardiovascular:     Rate and Rhythm: Normal rate and regular rhythm.     Heart sounds: No murmur.  Pulmonary:     Effort: Pulmonary effort is normal. No respiratory distress.     Breath sounds: Normal breath sounds. No wheezing or rhonchi.  Chest:     Chest wall: No tenderness.  Abdominal:     General: Abdomen is flat.     Palpations: Abdomen is soft.     Tenderness: There is no abdominal tenderness. There is no right CVA tenderness or left CVA tenderness.  Musculoskeletal:        General: No tenderness.     Cervical back: Neck supple.  Skin:    General: Skin is warm and dry.     Findings: No erythema.  Neurological:     General: No focal deficit present.     Mental Status: He is alert.     ED Results / Procedures / Treatments   Labs (all labs ordered are listed, but only abnormal results are displayed) Labs Reviewed  URINALYSIS, ROUTINE W REFLEX MICROSCOPIC - Abnormal; Notable for the following components:      Result Value   APPearance CLOUDY (*)    Glucose, UA >=500 (*)    Hgb urine dipstick LARGE (*)    Protein, ur 30 (*)    Bacteria, UA RARE (*)    All other components within normal limits  URINE CULTURE    EKG None  Radiology No results found.  Procedures Procedures (including critical care time)  Medications Ordered in ED Medications - No data to display  ED Course  I have reviewed the triage vital signs and the nursing notes.  Pertinent labs & imaging results that were available during my care of the patient were reviewed by me and considered in my medical decision making (see chart for details).    MDM Rules/Calculators/A&P                      Tommy Yang is a 69 y.o. male with a past medical history significant for hypertension, diabetes, DVT, hyperlipidemia, sleep apnea, prostate cancer,  and recent  urinary retention had a Foley cath removed today who presents with abdominal pain and urinary retention.  He reports that this morning he had his Foley catheter removed and was instructed to drink fluids to see if he could urinate.  Patient works he was unable to urinate and was having worsening abdominal discomfort.  He tried to call his urologist but they were unable to see him this afternoon and sent him to the emergency department.  He is complaining of some suprapubic abdominal pain but no flank pain, nausea, vomiting, fatigue, malaise, fevers, or chills.  He denies any dysuria.  He reports he just has the abdominal pain and cannot urinate.  On exam abdomen is nontender.  Patient did have normal bowel sounds.  Lungs were clear and chest was nontender.  No CVA tenderness on my exam.  Nursing did a bladder scan which showed 0 cc however clinically we are very concerned about Rosanne Gutting retention again.  Foley catheter was placed and over 700 cc of urine returned.  Patient had immediate resolution of his symptoms and was feeling much better.  Patient has a urology appointment scheduled for tomorrow morning which she will go to.  Urinalysis was completed and did not show nitrites or leukocytes but a culture was sent.  He not feel his UTI at this time.  Patient will follow up the appointment and agreed with plan of care.  He no questions or concerns and was discharged in good condition.    Final Clinical Impression(s) / ED Diagnoses Final diagnoses:  Urinary retention  Lower abdominal pain    Rx / DC Orders ED Discharge Orders    None     Clinical Impression: 1. Urinary retention   2. Lower abdominal pain     Disposition: Discharge  Condition: Good  I have discussed the results, Dx and Tx plan with the pt(& family if present). He/she/they expressed understanding and agree(s) with the plan. Discharge instructions discussed at great length. Strict return precautions discussed and pt &/or  family have verbalized understanding of the instructions. No further questions at time of discharge.    New Prescriptions   No medications on file    Follow Up: Your Urologist tomorrow        Tavon Corriher, Gwenyth Allegra, MD 09/06/19 [phone]

## 2019-09-06 NOTE — ED Triage Notes (Signed)
Pt had foley cath removed today, was told to go home and drink normally, he has not able to void and is very uncomfortable

## 2019-09-07 ENCOUNTER — Emergency Department (HOSPITAL_COMMUNITY)
Admission: EM | Admit: 2019-09-07 | Discharge: 2019-09-07 | Disposition: A | Discharge status: Home / Self Care | Payer: BC Managed Care – PPO | Attending: Emergency Medicine | Admitting: Emergency Medicine

## 2019-09-07 ENCOUNTER — Other Ambulatory Visit: Payer: Self-pay

## 2019-09-07 ENCOUNTER — Encounter (HOSPITAL_COMMUNITY): Payer: Self-pay

## 2019-09-07 DIAGNOSIS — Z7984 Long term (current) use of oral hypoglycemic drugs: Secondary | ICD-10-CM | POA: Insufficient documentation

## 2019-09-07 DIAGNOSIS — Z79899 Other long term (current) drug therapy: Secondary | ICD-10-CM | POA: Insufficient documentation

## 2019-09-07 DIAGNOSIS — D075 Carcinoma in situ of prostate: Secondary | ICD-10-CM | POA: Diagnosis not present

## 2019-09-07 DIAGNOSIS — T83091A Other mechanical complication of indwelling urethral catheter, initial encounter: Secondary | ICD-10-CM | POA: Diagnosis not present

## 2019-09-07 DIAGNOSIS — Z87891 Personal history of nicotine dependence: Secondary | ICD-10-CM | POA: Diagnosis not present

## 2019-09-07 DIAGNOSIS — Z7901 Long term (current) use of anticoagulants: Secondary | ICD-10-CM | POA: Diagnosis not present

## 2019-09-07 DIAGNOSIS — Y732 Prosthetic and other implants, materials and accessory gastroenterology and urology devices associated with adverse incidents: Secondary | ICD-10-CM | POA: Insufficient documentation

## 2019-09-07 DIAGNOSIS — I10 Essential (primary) hypertension: Secondary | ICD-10-CM | POA: Insufficient documentation

## 2019-09-07 DIAGNOSIS — E119 Type 2 diabetes mellitus without complications: Secondary | ICD-10-CM | POA: Diagnosis not present

## 2019-09-07 DIAGNOSIS — N5201 Erectile dysfunction due to arterial insufficiency: Secondary | ICD-10-CM | POA: Diagnosis not present

## 2019-09-07 DIAGNOSIS — C61 Malignant neoplasm of prostate: Secondary | ICD-10-CM | POA: Diagnosis not present

## 2019-09-07 LAB — URINE CULTURE: Culture: NO GROWTH

## 2019-09-07 NOTE — Discharge Instructions (Signed)
Call your urologist today.

## 2019-09-07 NOTE — ED Provider Notes (Signed)
[facility] DEPT Provider Note   CSN: [id number] Arrival date & time: 09/07/19  0248     History Chief Complaint  Patient presents with  . Urinary Retention    Tommy Yang is a 69 y.o. male.  HPI    Patient presents with Foley complication.  Patient was just seen in emergency room yesterday for urinary retention.  He had a Foley placed and felt improved. After going home, he noted there was decreased urine output.  He reports he is on anticoagulation he thinks that he may have a clot blocking the urine flow.  He reports abdominal pain.  No fevers or vomiting.  No other acute complaints Past Medical History:  Diagnosis Date  . Atrial fibrillation ([place])   . Diabetes mellitus without complication ([place])   . DVT (deep venous thrombosis) ([facility])    left leg.  [name] GERD (gastroesophageal reflux disease)   . Hypercholesterolemia   . Hypertension   . Prostate cancer ([place]) 12/06/13   gleason 4+3=7, 6/12 cores positive. seed implant and radiation    Patient Active Problem List   Diagnosis Date Noted  . Bilateral hydronephrosis 08/12/2019  . Obstructive uropathy 08/11/2019  . Acute lower UTI 08/11/2019  . AKI (acute kidney injury) ([place]) 08/11/2019  . Acute urinary retention 08/11/2019  . Dysuria 08/10/2019  . Constipation 08/10/2019  . Low back pain 08/10/2019  . Paroxysmal atrial fibrillation with RVR ([place]) 05/23/2019  . Paroxysmal atrial fibrillation ([place]) 05/23/2019  . Encounter for well adult exam with abnormal findings 02/24/2019  . Leg wound, right 02/24/2019  . Essential hypertension 09/24/2018  . Persistent atrial fibrillation   . Microalbuminuria 04/23/2016  . Atrial fibrillation ([place]) 12/29/2015  . Rectal bleeding 07/18/2015  . Dysphagia 07/18/2015  . Chronic anticoagulation 03/14/2015  . Morbid obesity ([place]) 03/14/2015  . Exertional shortness of breath 01/12/2015  . Abnormal ECG 01/12/2015  . Encounter for screening colonoscopy  05/09/2014  . Esophageal reflux 05/09/2014  . Other dysphagia 05/09/2014  . Evaluate for OSA (obstructive sleep apnea) 02/28/2014  . Hyperlipidemia 02/28/2014  . Prostate cancer ([place]) 12/14/2013  . Erectile dysfunction 10/26/2013  . Type II diabetes mellitus, uncontrolled ([place]) 10/19/2013  . DVT (deep venous thrombosis) ([place])   . Left leg DVT ([place]) 09/28/2013  . Leg edema, left 09/28/2013    Past Surgical History:  Procedure Laterality Date  . APPENDECTOMY    . BALLOON DILATION N/A 08/02/2015   Procedure: BALLOON DILATION;  Surgeon: [name], MD;  Location: [facility];  Service: Endoscopy;  Laterality: N/A;  . CARDIOVERSION N/A 05/13/2018   Procedure: CARDIOVERSION;  Surgeon: Sanda Klein, MD;  Location: [facility];  Service: Cardiovascular;  Laterality: N/A;  . CARDIOVERSION N/A 05/18/2018   Procedure: CARDIOVERSION;  Surgeon: Buford Dresser, MD;  Location: [facility];  Service: Cardiovascular;  Laterality: N/A;  . COLONOSCOPY WITH PROPOFOL N/A 08/02/2015   Procedure: COLONOSCOPY WITH PROPOFOL w/ APC;  Surgeon: [name], MD;  Location: [facility];  Service: Endoscopy;  Laterality: N/A;  . ESOPHAGOGASTRODUODENOSCOPY (EGD) WITH PROPOFOL N/A 08/02/2015   Procedure: ESOPHAGOGASTRODUODENOSCOPY (EGD) WITH PROPOFOL/ possible dilation.;  Surgeon: [name], MD;  Location: [facility];  Service: Endoscopy;  Laterality: N/A;  . KNEE SURGERY     right knee orthoscopic  . PROSTATE BIOPSY  12/06/13   Gleason 4+3=7, volume 35 gm  . [place]  . TONSILLECTOMY         Family History  Problem Relation Age of Onset  . Heart disease Mother        before age 65  . Diabetes Mother   . Hyperlipidemia Mother   . Varicose Veins Mother   . Hypertension Mother   . Heart disease Father   . Diabetes Father   . Hyperlipidemia Father   . Hypertension Father   . Cancer Brother   . COPD Brother   . Diabetes Brother   .  Hyperlipidemia Brother   . Hypertension Brother     Social History   Tobacco Use  . Smoking status: Former Smoker    Packs/day: 0.50    Years: 23.00    Pack years: 11.50    Types: Cigarettes    Quit date: 08/01/2012    Years since quitting: 7.1  . Smokeless tobacco: Never Used  Substance Use Topics  . Alcohol use: Yes    Alcohol/week: 0.0 standard drinks    Comment: very seldom, maybe 3x per year  . Drug use: No    Home Medications Prior to Admission medications   Medication Sig Start Date End Date Taking? Authorizing Provider  amLODipine (NORVASC) 2.5 MG tablet Take 1 tablet (2.5 mg total) by mouth daily. 02/24/19   [name], MD  atorvastatin (LIPITOR) 40 MG tablet TAKE ONE TABLET BY MOUTH ONCE DAILY. Patient taking differently: Take 40 mg by mouth daily after breakfast.  02/24/19   [name], MD  blood glucose meter kit and supplies KIT Test blood sugar daily as directed. Dx code: E11.9 01/23/15   Darlyne Russian, MD  CINNAMON PO Take 1,000 mg by mouth daily.    [provider]  Coral Calcium 1000 (390 Ca) MG TABS Take 1,000 mg by mouth daily.    [provider]  glucose blood (ACCU-CHEK GUIDE) test strip Use as instructed to check blood sugar twice daily. 10/29/18   Elayne Snare, MD  INVOKAMET XR [phone] MG TB24 daily. Taking 2 tablets in the am 08/15/19   [provider]  leuprolide (LUPRON) 11.25 MG injection Inject 11.25 mg into the muscle every 6 (six) months.     [provider]  losartan (COZAAR) 50 MG tablet Take 1 tablet (50 mg total) by mouth daily. 05/16/19   [name], MD  metoprolol tartrate (LOPRESSOR) 100 MG tablet TAKE 1 TABLET BY MOUTH TWICE A DAY 08/03/19   Sherran Needs, NP  Multiple Vitamins-Minerals (ZINC PO) Take 1 tablet by mouth daily.    [provider]  OZEMPIC, 1 MG/DOSE, 2 MG/1.5ML SOPN INJECT 1MG UNDER THE SKIN ONCE WEEKLY. Patient taking differently: Inject 1 mg into the skin once a week.   06/28/19   Elayne Snare, MD  pantoprazole (PROTONIX) 40 MG tablet Take 1 tablet (40 mg total) by mouth daily. 02/24/19   [name], MD  rivaroxaban (XARELTO) 20 MG TABS tablet Take 20 mg by mouth daily before breakfast.    [provider]  tamsulosin (FLOMAX) 0.4 MG CAPS capsule Take 1 capsule (0.4 mg total) by mouth daily. Patient taking differently: Take 0.4 mg by mouth 2 (two) times daily.  08/14/19   Shawna Clamp, MD  Turmeric 450 MG CAPS Take 450 mg by mouth daily after breakfast.     [provider]  Vitamin D, Cholecalciferol, 400 UNITS TABS Take 400 Units by mouth daily.    [provider]    Allergies    Patient has no known allergies.  Review of Systems  Review of Systems  Constitutional: Negative for fever.  Gastrointestinal: Negative for vomiting.  Genitourinary: Positive for difficulty urinating.    Physical Exam Updated Vital Signs BP (!) 132/93 (BP Location: Right Arm)   Pulse 94   Temp 98.3 F (36.8 C) (Oral)   Resp 18   SpO2 98%   Physical Exam CONSTITUTIONAL: Well developed/well nourished HEAD: Normocephalic/atraumatic EYES: EOMI ENMT: Mucous membranes moist NECK: supple no meningeal signs LUNGS:no apparent distress ABDOMEN: soft, mild suprapubic tenderness, obese GU: Foley catheter in place, no acute complication NEURO: Pt is awake/alert/appropriate, moves all extremitiesx4.  No facial droop.   EXTREMITIES:  full ROM SKIN: warm, color normal PSYCH: no abnormalities of mood noted, alert and oriented to situation ED Results / Procedures / Treatments   Labs (all labs ordered are listed, but only abnormal results are displayed) Labs Reviewed - No data to display  EKG None  Radiology No results found.  Procedures Procedures   Medications Ordered in ED Medications - No data to display  ED Course  I have reviewed the triage vital signs and the nursing notes.     MDM Rules/Calculators/A&P                       Patient presents for obstructed Foley catheter.  Nursing was able to irrigate the Foley and he is now having unobstructed urine flow.  Patient feels improved.  Will discharge home and he can call his urologist later today Final Clinical Impression(s) / ED Diagnoses Final diagnoses:  Obstruction of Foley catheter, initial encounter [facility])    Rx / DC Orders ED Discharge Orders    None       Ripley Fraise, MD 09/07/19 [phone]

## 2019-09-07 NOTE — ED Triage Notes (Signed)
Pt had catheter removed to day and he was unable to void, he was here and received a catheter from [name] at 1700, he emptied his bag around 9pm and since then he's had very little urine output

## 2019-09-12 DIAGNOSIS — C61 Malignant neoplasm of prostate: Secondary | ICD-10-CM | POA: Diagnosis not present

## 2019-09-12 DIAGNOSIS — R310 Gross hematuria: Secondary | ICD-10-CM | POA: Diagnosis not present

## 2019-09-12 DIAGNOSIS — C778 Secondary and unspecified malignant neoplasm of lymph nodes of multiple regions: Secondary | ICD-10-CM | POA: Diagnosis not present

## 2019-09-12 DIAGNOSIS — R338 Other retention of urine: Secondary | ICD-10-CM | POA: Diagnosis not present

## 2019-09-13 ENCOUNTER — Other Ambulatory Visit: Payer: Self-pay | Admitting: Urology

## 2019-09-21 ENCOUNTER — Other Ambulatory Visit: Payer: Self-pay | Admitting: Urology

## 2019-09-22 ENCOUNTER — Other Ambulatory Visit: Payer: Self-pay | Admitting: Internal Medicine

## 2019-09-22 DIAGNOSIS — E785 Hyperlipidemia, unspecified: Secondary | ICD-10-CM

## 2019-09-22 NOTE — Telephone Encounter (Signed)
Please refill as per office routine med refill policy (all routine meds refilled for 3 mo or monthly per pt preference up to one year from last visit, then month to month grace period for 3 mo, then further med refills will have to be denied)  

## 2019-09-26 ENCOUNTER — Other Ambulatory Visit: Payer: Self-pay

## 2019-09-26 ENCOUNTER — Encounter (HOSPITAL_BASED_OUTPATIENT_CLINIC_OR_DEPARTMENT_OTHER): Payer: Self-pay | Admitting: Urology

## 2019-09-26 NOTE — Progress Notes (Addendum)
Spoke w/ via phone for pre-op interview---[facility] Lab needs dos----  I stat 8            Lab results------ COVID test ------09-27-2019 Arrive at -------530 am 09-30-2019 NPO after ------midnight Medications to take morning of surgery -----atorvastatin, metoprolol, amlodipine, flomax, pantaprazole Diabetic medication -----none day of surgery Patient Special Instructions -----bring all prescription medications in original containers. Pre-Op special Istructions ----- Patient verbalized understanding of instructions that were given at this phone interview. Patient denies shortness of breath, chest pain, fever, cough a this phone interview.  Anesthesia : xarlto, history of blood clot left leg  [id number] john Cardiologist :[facility] cardiac clearance donna carroll np 09-05-2019 chart/epic Chest x-ray : 1 view 05-22-2019 chart/epic EKG :09-05-2019 chart/epic Echo :05-02-2019 chart/epic Cardiac Cath : none Sleep Study/ CPAP :no cpap used last sleep study 3 yrs ago at [facility] per patient Fasting Blood Sugar :      / Checks Blood Sugar -- times a day: checks blood sugar bid, fasting blood sugar 120 to 150 Blood Thinner/ Instructions /Last Dose:last dose xarelto 09-26-2019 ,patient told by donna carroll np to stop  xarelto 3 days before surgery . ASA / Instructions/ Last Dose : none  Patient denies shortness of breath, chest pain, fever, and cough at this phone interview.

## 2019-09-27 ENCOUNTER — Other Ambulatory Visit (HOSPITAL_COMMUNITY)
Admission: RE | Admit: 2019-09-27 | Discharge: 2019-09-27 | Disposition: A | Discharge status: Home / Self Care | Payer: BC Managed Care – PPO | Source: Ambulatory Visit | Attending: Urology | Admitting: Urology

## 2019-09-27 DIAGNOSIS — Z20822 Contact with and (suspected) exposure to covid-19: Secondary | ICD-10-CM | POA: Insufficient documentation

## 2019-09-27 DIAGNOSIS — Z01812 Encounter for preprocedural laboratory examination: Secondary | ICD-10-CM | POA: Insufficient documentation

## 2019-09-27 LAB — SARS CORONAVIRUS 2 (TAT 6-24 HRS): SARS Coronavirus 2: NEGATIVE

## 2019-09-29 MED ORDER — GENTAMICIN SULFATE 40 MG/ML IJ SOLN
5.0000 mg/kg | INTRAVENOUS | Status: DC
  Administered 2019-09-30: 500 mg via INTRAVENOUS
  Filled 2019-09-29 (×2): qty 12.5

## 2019-09-30 ENCOUNTER — Ambulatory Visit (HOSPITAL_BASED_OUTPATIENT_CLINIC_OR_DEPARTMENT_OTHER)
Admission: RE | Admit: 2019-09-30 | Discharge: 2019-10-01 | Disposition: A | Discharge status: Home / Self Care | Payer: BC Managed Care – PPO | Attending: Urology | Admitting: Urology

## 2019-09-30 ENCOUNTER — Encounter (HOSPITAL_BASED_OUTPATIENT_CLINIC_OR_DEPARTMENT_OTHER)
Admission: RE | Disposition: A | Discharge status: Home / Self Care | Payer: Self-pay | Source: Home / Self Care | Attending: Urology

## 2019-09-30 ENCOUNTER — Ambulatory Visit (HOSPITAL_BASED_OUTPATIENT_CLINIC_OR_DEPARTMENT_OTHER): Payer: BC Managed Care – PPO | Admitting: Physician Assistant

## 2019-09-30 ENCOUNTER — Encounter (HOSPITAL_BASED_OUTPATIENT_CLINIC_OR_DEPARTMENT_OTHER): Payer: Self-pay | Admitting: Urology

## 2019-09-30 ENCOUNTER — Other Ambulatory Visit: Payer: Self-pay

## 2019-09-30 DIAGNOSIS — Z86718 Personal history of other venous thrombosis and embolism: Secondary | ICD-10-CM | POA: Diagnosis not present

## 2019-09-30 DIAGNOSIS — C775 Secondary and unspecified malignant neoplasm of intrapelvic lymph nodes: Secondary | ICD-10-CM | POA: Diagnosis not present

## 2019-09-30 DIAGNOSIS — Z8249 Family history of ischemic heart disease and other diseases of the circulatory system: Secondary | ICD-10-CM | POA: Insufficient documentation

## 2019-09-30 DIAGNOSIS — E119 Type 2 diabetes mellitus without complications: Secondary | ICD-10-CM | POA: Insufficient documentation

## 2019-09-30 DIAGNOSIS — R338 Other retention of urine: Secondary | ICD-10-CM | POA: Diagnosis not present

## 2019-09-30 DIAGNOSIS — Z7984 Long term (current) use of oral hypoglycemic drugs: Secondary | ICD-10-CM | POA: Diagnosis not present

## 2019-09-30 DIAGNOSIS — I10 Essential (primary) hypertension: Secondary | ICD-10-CM | POA: Insufficient documentation

## 2019-09-30 DIAGNOSIS — Z7901 Long term (current) use of anticoagulants: Secondary | ICD-10-CM | POA: Insufficient documentation

## 2019-09-30 DIAGNOSIS — E785 Hyperlipidemia, unspecified: Secondary | ICD-10-CM | POA: Diagnosis not present

## 2019-09-30 DIAGNOSIS — Z79899 Other long term (current) drug therapy: Secondary | ICD-10-CM | POA: Insufficient documentation

## 2019-09-30 DIAGNOSIS — M1990 Unspecified osteoarthritis, unspecified site: Secondary | ICD-10-CM | POA: Diagnosis not present

## 2019-09-30 DIAGNOSIS — Z8349 Family history of other endocrine, nutritional and metabolic diseases: Secondary | ICD-10-CM | POA: Diagnosis not present

## 2019-09-30 DIAGNOSIS — Z833 Family history of diabetes mellitus: Secondary | ICD-10-CM | POA: Diagnosis not present

## 2019-09-30 DIAGNOSIS — C61 Malignant neoplasm of prostate: Secondary | ICD-10-CM | POA: Insufficient documentation

## 2019-09-30 DIAGNOSIS — C7951 Secondary malignant neoplasm of bone: Secondary | ICD-10-CM | POA: Diagnosis not present

## 2019-09-30 DIAGNOSIS — Z923 Personal history of irradiation: Secondary | ICD-10-CM | POA: Insufficient documentation

## 2019-09-30 DIAGNOSIS — Z6841 Body Mass Index (BMI) 40.0 and over, adult: Secondary | ICD-10-CM | POA: Insufficient documentation

## 2019-09-30 DIAGNOSIS — Z8546 Personal history of malignant neoplasm of prostate: Secondary | ICD-10-CM | POA: Insufficient documentation

## 2019-09-30 DIAGNOSIS — K219 Gastro-esophageal reflux disease without esophagitis: Secondary | ICD-10-CM | POA: Diagnosis not present

## 2019-09-30 DIAGNOSIS — R339 Retention of urine, unspecified: Secondary | ICD-10-CM | POA: Diagnosis present

## 2019-09-30 DIAGNOSIS — N401 Enlarged prostate with lower urinary tract symptoms: Secondary | ICD-10-CM | POA: Diagnosis not present

## 2019-09-30 DIAGNOSIS — N3289 Other specified disorders of bladder: Secondary | ICD-10-CM | POA: Diagnosis not present

## 2019-09-30 DIAGNOSIS — Z809 Family history of malignant neoplasm, unspecified: Secondary | ICD-10-CM | POA: Insufficient documentation

## 2019-09-30 DIAGNOSIS — I4891 Unspecified atrial fibrillation: Secondary | ICD-10-CM | POA: Insufficient documentation

## 2019-09-30 DIAGNOSIS — E7800 Pure hypercholesterolemia, unspecified: Secondary | ICD-10-CM | POA: Diagnosis not present

## 2019-09-30 DIAGNOSIS — G4730 Sleep apnea, unspecified: Secondary | ICD-10-CM | POA: Diagnosis not present

## 2019-09-30 DIAGNOSIS — Z825 Family history of asthma and other chronic lower respiratory diseases: Secondary | ICD-10-CM | POA: Diagnosis not present

## 2019-09-30 HISTORY — DX: Retention of urine, unspecified: R33.9

## 2019-09-30 HISTORY — DX: Personal history of irradiation: Z92.3

## 2019-09-30 HISTORY — DX: Unspecified osteoarthritis, unspecified site: M19.90

## 2019-09-30 HISTORY — DX: Presence of other specified devices: Z97.8

## 2019-09-30 HISTORY — PX: TRANSURETHRAL RESECTION OF PROSTATE: SHX73

## 2019-09-30 HISTORY — DX: Sleep apnea, unspecified: G47.30

## 2019-09-30 LAB — POCT I-STAT, CHEM 8
BUN: 25 mg/dL — ABNORMAL HIGH (ref 8–23)
Calcium, Ion: 1.14 mmol/L — ABNORMAL LOW (ref 1.15–1.40)
Chloride: 110 mmol/L (ref 98–111)
Creatinine, Ser: 1 mg/dL (ref 0.61–1.24)
Glucose, Bld: 122 mg/dL — ABNORMAL HIGH (ref 70–99)
HCT: 39 % (ref 39.0–52.0)
Hemoglobin: 13.3 g/dL (ref 13.0–17.0)
Potassium: 4.6 mmol/L (ref 3.5–5.1)
Sodium: 142 mmol/L (ref 135–145)
TCO2: 26 mmol/L (ref 22–32)

## 2019-09-30 LAB — GLUCOSE, CAPILLARY
Glucose-Capillary: 107 mg/dL — ABNORMAL HIGH (ref 70–99)
Glucose-Capillary: 184 mg/dL — ABNORMAL HIGH (ref 70–99)
Glucose-Capillary: 163 mg/dL — ABNORMAL HIGH (ref 70–99)

## 2019-09-30 SURGERY — TURP (TRANSURETHRAL RESECTION OF PROSTATE)
Anesthesia: General | Site: Prostate

## 2019-09-30 MED ORDER — AMLODIPINE BESYLATE 2.5 MG PO TABS
2.5000 mg | ORAL_TABLET | Freq: Every day | ORAL | Status: DC
  Filled 2019-09-30: qty 1

## 2019-09-30 MED ORDER — FENTANYL CITRATE (PF) 100 MCG/2ML IJ SOLN
INTRAMUSCULAR | Status: AC
  Filled 2019-09-30: qty 2

## 2019-09-30 MED ORDER — EPHEDRINE SULFATE 50 MG/ML IJ SOLN
INTRAMUSCULAR | Status: DC | PRN
  Administered 2019-09-30 (×3): 5 mg via INTRAVENOUS

## 2019-09-30 MED ORDER — OXYCODONE HCL 5 MG PO TABS
5.0000 mg | ORAL_TABLET | ORAL | Status: DC | PRN
  Filled 2019-09-30: qty 1

## 2019-09-30 MED ORDER — LACTATED RINGERS IV SOLN: INTRAVENOUS | Status: DC | PRN

## 2019-09-30 MED ORDER — ONDANSETRON HCL 4 MG/2ML IJ SOLN
INTRAMUSCULAR | Status: AC
  Filled 2019-09-30: qty 2

## 2019-09-30 MED ORDER — ACETAMINOPHEN 500 MG PO TABS
ORAL_TABLET | ORAL | Status: AC
  Filled 2019-09-30: qty 2

## 2019-09-30 MED ORDER — ONDANSETRON HCL 4 MG/2ML IJ SOLN
INTRAMUSCULAR | Status: DC | PRN
  Administered 2019-09-30: 4 mg via INTRAVENOUS

## 2019-09-30 MED ORDER — LABETALOL HCL 5 MG/ML IV SOLN
10.0000 mg | Freq: Once | INTRAVENOUS | Status: AC
  Administered 2019-09-30: 10 mg via INTRAVENOUS
  Filled 2019-09-30: qty 4

## 2019-09-30 MED ORDER — INSULIN ASPART 100 UNIT/ML ~~LOC~~ SOLN
0.0000 [IU] | Freq: Three times a day (TID) | SUBCUTANEOUS | Status: DC
  Administered 2019-09-30 (×2): 3 [IU] via SUBCUTANEOUS
  Filled 2019-09-30: qty 0.15

## 2019-09-30 MED ORDER — PANTOPRAZOLE SODIUM 40 MG PO TBEC
40.0000 mg | DELAYED_RELEASE_TABLET | Freq: Every day | ORAL | Status: DC
  Filled 2019-09-30: qty 1

## 2019-09-30 MED ORDER — FENTANYL CITRATE (PF) 100 MCG/2ML IJ SOLN
25.0000 ug | INTRAMUSCULAR | Status: DC | PRN
  Administered 2019-09-30: 50 ug via INTRAVENOUS
  Administered 2019-09-30: 25 ug via INTRAVENOUS
  Filled 2019-09-30: qty 1

## 2019-09-30 MED ORDER — SODIUM CHLORIDE 0.9 % IR SOLN
Status: DC | PRN
  Administered 2019-09-30: 9000 mL via INTRAVESICAL

## 2019-09-30 MED ORDER — ATORVASTATIN CALCIUM 40 MG PO TABS
40.0000 mg | ORAL_TABLET | Freq: Every day | ORAL | Status: DC
  Filled 2019-09-30: qty 1

## 2019-09-30 MED ORDER — DEXAMETHASONE SODIUM PHOSPHATE 4 MG/ML IJ SOLN
INTRAMUSCULAR | Status: DC | PRN
  Administered 2019-09-30: 4 mg via INTRAVENOUS

## 2019-09-30 MED ORDER — INSULIN ASPART 100 UNIT/ML ~~LOC~~ SOLN
3.0000 [IU] | Freq: Three times a day (TID) | SUBCUTANEOUS | Status: DC
  Administered 2019-09-30 (×2): 3 [IU] via SUBCUTANEOUS
  Filled 2019-09-30: qty 0.03

## 2019-09-30 MED ORDER — PROMETHAZINE HCL 25 MG/ML IJ SOLN
INTRAMUSCULAR | Status: AC
  Filled 2019-09-30: qty 1

## 2019-09-30 MED ORDER — LABETALOL HCL 5 MG/ML IV SOLN
INTRAVENOUS | Status: AC
  Filled 2019-09-30: qty 4

## 2019-09-30 MED ORDER — OXYCODONE HCL 5 MG/5ML PO SOLN
5.0000 mg | Freq: Once | ORAL | Status: DC | PRN
  Filled 2019-09-30: qty 5

## 2019-09-30 MED ORDER — SENNOSIDES-DOCUSATE SODIUM 8.6-50 MG PO TABS
1.0000 | ORAL_TABLET | Freq: Two times a day (BID) | ORAL | Status: DC
  Filled 2019-09-30: qty 1

## 2019-09-30 MED ORDER — HYDROMORPHONE HCL 1 MG/ML IJ SOLN
0.5000 mg | INTRAMUSCULAR | Status: DC | PRN
  Filled 2019-09-30: qty 1

## 2019-09-30 MED ORDER — OXYCODONE HCL 5 MG PO TABS
5.0000 mg | ORAL_TABLET | Freq: Once | ORAL | Status: DC | PRN
  Filled 2019-09-30: qty 1

## 2019-09-30 MED ORDER — PHENYLEPHRINE 40 MCG/ML (10ML) SYRINGE FOR IV PUSH (FOR BLOOD PRESSURE SUPPORT)
PREFILLED_SYRINGE | INTRAVENOUS | Status: AC
  Filled 2019-09-30: qty 10

## 2019-09-30 MED ORDER — METOPROLOL TARTRATE 100 MG PO TABS
100.0000 mg | ORAL_TABLET | Freq: Two times a day (BID) | ORAL | Status: DC
  Administered 2019-09-30: 100 mg via ORAL
  Filled 2019-09-30: qty 1

## 2019-09-30 MED ORDER — FENTANYL CITRATE (PF) 100 MCG/2ML IJ SOLN
INTRAMUSCULAR | Status: DC | PRN
  Administered 2019-09-30: 25 ug via INTRAVENOUS
  Administered 2019-09-30: 50 ug via INTRAVENOUS
  Administered 2019-09-30: 25 ug via INTRAVENOUS
  Administered 2019-09-30 (×2): 50 ug via INTRAVENOUS

## 2019-09-30 MED ORDER — DEXAMETHASONE SODIUM PHOSPHATE 10 MG/ML IJ SOLN
INTRAMUSCULAR | Status: AC
  Filled 2019-09-30: qty 2

## 2019-09-30 MED ORDER — CEPHALEXIN 500 MG PO CAPS: 500.0000 mg | ORAL_CAPSULE | Freq: Two times a day (BID) | ORAL | 0 refills | Status: DC

## 2019-09-30 MED ORDER — HEPARIN SODIUM (PORCINE) 5000 UNIT/ML IJ SOLN
INTRAMUSCULAR | Status: AC
  Filled 2019-09-30: qty 1

## 2019-09-30 MED ORDER — LIDOCAINE HCL (CARDIAC) PF 100 MG/5ML IV SOSY
PREFILLED_SYRINGE | INTRAVENOUS | Status: DC | PRN
  Administered 2019-09-30: 80 mg via INTRAVENOUS

## 2019-09-30 MED ORDER — OXYCODONE-ACETAMINOPHEN 5-325 MG PO TABS: 1.0000 | ORAL_TABLET | Freq: Three times a day (TID) | ORAL | 0 refills | Status: DC | PRN

## 2019-09-30 MED ORDER — ONDANSETRON HCL 4 MG/2ML IJ SOLN
4.0000 mg | Freq: Four times a day (QID) | INTRAMUSCULAR | Status: DC | PRN
  Filled 2019-09-30: qty 2

## 2019-09-30 MED ORDER — PROPOFOL 10 MG/ML IV BOLUS
INTRAVENOUS | Status: DC | PRN
  Administered 2019-09-30: 180 mg via INTRAVENOUS

## 2019-09-30 MED ORDER — EPHEDRINE 5 MG/ML INJ
INTRAVENOUS | Status: AC
  Filled 2019-09-30: qty 20

## 2019-09-30 MED ORDER — LABETALOL HCL 5 MG/ML IV SOLN
5.0000 mg | INTRAVENOUS | Status: AC | PRN
  Administered 2019-09-30 (×2): 5 mg via INTRAVENOUS
  Filled 2019-09-30: qty 4

## 2019-09-30 MED ORDER — LIDOCAINE 2% (20 MG/ML) 5 ML SYRINGE
INTRAMUSCULAR | Status: AC
  Filled 2019-09-30: qty 10

## 2019-09-30 MED ORDER — PHENYLEPHRINE HCL (PRESSORS) 10 MG/ML IV SOLN
INTRAVENOUS | Status: DC | PRN
  Administered 2019-09-30 (×8): 80 ug via INTRAVENOUS

## 2019-09-30 MED ORDER — LABETALOL HCL 5 MG/ML IV SOLN
5.0000 mg | Freq: Once | INTRAVENOUS | Status: AC
  Filled 2019-09-30: qty 4

## 2019-09-30 MED ORDER — ONDANSETRON HCL 4 MG/2ML IJ SOLN
INTRAMUSCULAR | Status: AC
  Filled 2019-09-30: qty 4

## 2019-09-30 MED ORDER — HEPARIN SODIUM (PORCINE) 5000 UNIT/ML IJ SOLN
5000.0000 [IU] | Freq: Three times a day (TID) | INTRAMUSCULAR | Status: DC
  Administered 2019-09-30 – 2019-10-01 (×3): 5000 [IU] via SUBCUTANEOUS
  Filled 2019-09-30: qty 1

## 2019-09-30 MED ORDER — INSULIN ASPART 100 UNIT/ML ~~LOC~~ SOLN
SUBCUTANEOUS | Status: AC
  Filled 2019-09-30: qty 1

## 2019-09-30 MED ORDER — SENNA 8.6 MG PO TABS
ORAL_TABLET | ORAL | Status: AC
  Filled 2019-09-30: qty 1

## 2019-09-30 MED ORDER — ACETAMINOPHEN 500 MG PO TABS
1000.0000 mg | ORAL_TABLET | Freq: Three times a day (TID) | ORAL | Status: AC
  Administered 2019-09-30 – 2019-10-01 (×3): 1000 mg via ORAL
  Filled 2019-09-30: qty 2

## 2019-09-30 MED ORDER — BACITRACIN-NEOMYCIN-POLYMYXIN OINTMENT TUBE
TOPICAL_OINTMENT | CUTANEOUS | Status: AC
  Filled 2019-09-30: qty 14.17

## 2019-09-30 MED ORDER — SODIUM CHLORIDE 0.9 % IR SOLN
3000.0000 mL | Status: DC
  Administered 2019-09-30: 3000 mL
  Filled 2019-09-30: qty 3000

## 2019-09-30 MED ORDER — PROMETHAZINE HCL 25 MG/ML IJ SOLN
6.2500 mg | Freq: Once | INTRAMUSCULAR | Status: AC
  Administered 2019-09-30: 6.25 mg via INTRAVENOUS
  Filled 2019-09-30: qty 1

## 2019-09-30 MED ORDER — PROPOFOL 10 MG/ML IV BOLUS
INTRAVENOUS | Status: AC
  Filled 2019-09-30: qty 20

## 2019-09-30 MED ORDER — SENNOSIDES-DOCUSATE SODIUM 8.6-50 MG PO TABS: 1.0000 | ORAL_TABLET | Freq: Two times a day (BID) | ORAL | 0 refills | Status: DC

## 2019-09-30 MED ORDER — LACTATED RINGERS IV SOLN
INTRAVENOUS | Status: DC
  Administered 2019-09-30: 1000 mL via INTRAVENOUS
  Filled 2019-09-30: qty 1000

## 2019-09-30 MED ORDER — PROPOFOL 10 MG/ML IV BOLUS
INTRAVENOUS | Status: AC
  Filled 2019-09-30: qty 40

## 2019-09-30 SURGICAL SUPPLY — 28 items
BAG DRAIN URO-CYSTO SKYTR STRL (DRAIN) ×2 IMPLANT
BAG DRN RND TRDRP ANRFLXCHMBR (UROLOGICAL SUPPLIES) ×1
BAG DRN UROCATH (DRAIN) ×1
BAG URINE DRAIN 2000ML AR STRL (UROLOGICAL SUPPLIES) ×2 IMPLANT
BAG URINE LEG 500ML (DRAIN) ×1 IMPLANT
CATH FOLEY 2WAY SLVR 30CC 20FR (CATHETERS) IMPLANT
CATH FOLEY 3WAY 30CC 24FR (CATHETERS) ×2
CATH URTH STD 24FR FL 3W 2 (CATHETERS) IMPLANT
CLOTH BEACON ORANGE TIMEOUT ST (SAFETY) ×2 IMPLANT
ELECT BIVAP BIPO 22/24 DONUT (ELECTROSURGICAL) ×2
ELECT REM PT RETURN 9FT ADLT (ELECTROSURGICAL)
ELECTRD BIVAP BIPO 22/24 DONUT (ELECTROSURGICAL) IMPLANT
ELECTRODE REM PT RTRN 9FT ADLT (ELECTROSURGICAL) IMPLANT
GLOVE BIO SURGEON STRL SZ7.5 (GLOVE) ×3 IMPLANT
GOWN STRL REUS W/ TWL LRG LVL3 (GOWN DISPOSABLE) ×1 IMPLANT
GOWN STRL REUS W/TWL LRG LVL3 (GOWN DISPOSABLE) ×2
GUIDEWIRE STR DUAL SENSOR (WIRE) ×1 IMPLANT
HOLDER FOLEY CATH W/STRAP (MISCELLANEOUS) ×2 IMPLANT
IV CATH 18GX1.25 SAFE RETR GRN (IV SOLUTION) ×1 IMPLANT
IV NS IRRIG 3000ML ARTHROMATIC (IV SOLUTION) ×4 IMPLANT
KIT TURNOVER CYSTO (KITS) ×2 IMPLANT
LOOP CUT BIPOLAR 24F LRG (ELECTROSURGICAL) IMPLANT
MANIFOLD NEPTUNE II (INSTRUMENTS) ×2 IMPLANT
PACK CYSTO (CUSTOM PROCEDURE TRAY) ×2 IMPLANT
SYR TOOMEY IRRIG 70ML (MISCELLANEOUS) ×2
SYRINGE TOOMEY IRRIG 70ML (MISCELLANEOUS) ×1 IMPLANT
TUBE CONNECTING 12X1/4 (SUCTIONS) ×2 IMPLANT
TUBING UROLOGY SET (TUBING) ×2 IMPLANT

## 2019-09-30 NOTE — Brief Op Note (Signed)
09/30/2019  8:16 AM  PATIENT:  Tommy Yang  69 y.o. male  PRE-OPERATIVE DIAGNOSIS:  URINARY RETENTION, PROSTATE CANCER  POST-OPERATIVE DIAGNOSIS:  URINARY RETENTION, PROSTATE CANCER  PROCEDURE:  Procedure(s) with comments: TRANSURETHRAL RESECTION OF THE PROSTATE (TURP) (N/A) - 1 HR  SURGEON:  Surgeon(s) and Role:    Alexis Frock, MD - Primary  PHYSICIAN ASSISTANT:   ASSISTANTS: none   ANESTHESIA:   general  EBL:  minimal   BLOOD ADMINISTERED:none  DRAINS: none   LOCAL MEDICATIONS USED:  NONE  SPECIMEN:  Source of Specimen:  NONE  DISPOSITION OF SPECIMEN:  N/A  COUNTS:  YES  TOURNIQUET:  * No tourniquets in log *  DICTATION: .Other Dictation: Dictation Number [id number]  PLAN OF CARE: Admit for overnight observation  PATIENT DISPOSITION:  PACU - hemodynamically stable.   Delay start of Pharmacological VTE agent (>24hrs) due to surgical blood loss or risk of bleeding: yes

## 2019-09-30 NOTE — Transfer of Care (Signed)
Immediate Anesthesia Transfer of Care Note  Patient: Tommy Yang  Procedure(s) Performed: Procedure(s) with comments: TRANSURETHRAL RESECTION OF THE PROSTATE (TURP) (N/A) - 1 HR  Patient Location: PACU  Anesthesia Type:General  Level of Consciousness:  sedated, patient cooperative and responds to stimulation  Airway & Oxygen Therapy:Patient Spontanous Breathing and Patient connected to face mask oxgen  Post-op Assessment:  Report given to PACU RN and Post -op Vital signs reviewed and stable  Post vital signs:  Reviewed and stable  Last Vitals:  Vitals:   09/30/19 0700 09/30/19 0830  BP: 129/82   Pulse: 73 (!) 112  Resp: 18 17  Temp: 36.6 C   SpO2: [id number]    Complications: No apparent anesthesia complications

## 2019-09-30 NOTE — Progress Notes (Signed)
Patient c/o intermittent nausea despite prn IV phenergan in PACU. Refused offer to call MD for new orders. Patient ambulated to [facility] for BM with assistance, refused offer by RN and family to ambulate back to [facility]. Patient verbally aggressive with staff and wife at bedside, states "I don't want to be babied, if I need something I'll tell you" and "I'll do everything myself". Refused gauze to catheter site. Patient was on O2 [place] while in bed, requested to sit in recliner. Assisted patient to recliner, refused O2 [place]. VSS. Will continue to monitor, emotional support provided.

## 2019-09-30 NOTE — Anesthesia Preprocedure Evaluation (Signed)
Anesthesia Evaluation  Patient identified by MRN, date of birth, ID band Patient awake    Reviewed: Allergy & Precautions, H&P , NPO status , Patient's Chart, lab work & pertinent test results  Airway Mallampati: II   Neck ROM: full    Dental   Pulmonary sleep apnea , former smoker,    breath sounds clear to auscultation       Cardiovascular hypertension, + dysrhythmias Atrial Fibrillation  Rhythm:regular Rate:Normal     Neuro/Psych    GI/Hepatic GERD  ,  Endo/Other  diabetes, Type 2Morbid obesity  Renal/GU    Prostate CA    Musculoskeletal  (+) Arthritis ,   Abdominal   Peds  Hematology   Anesthesia Other Findings   Reproductive/Obstetrics                             Anesthesia Physical Anesthesia Plan  ASA: III  Anesthesia Plan: General   Post-op Pain Management:    Induction: Intravenous  PONV Risk Score and Plan: 2 and Ondansetron, Dexamethasone, Midazolam and Treatment may vary due to age or medical condition  Airway Management Planned: LMA  Additional Equipment:   Intra-op Plan:   Post-operative Plan:   Informed Consent: I have reviewed the patients History and Physical, chart, labs and discussed the procedure including the risks, benefits and alternatives for the proposed anesthesia with the patient or authorized representative who has indicated his/her understanding and acceptance.       Plan Discussed with: CRNA, Anesthesiologist and Surgeon  Anesthesia Plan Comments:         Anesthesia Quick Evaluation

## 2019-09-30 NOTE — Op Note (Signed)
NAME: Tommy Yang, Tommy Yang MEDICAL RECORD [id number] ACCOUNT [id number] DATE OF BIRTH:[birth year] FACILITY: [facility] LOCATION: [facility] PHYSICIAN:Makyra Corprew, MD  OPERATIVE REPORT  DATE OF PROCEDURE:  09/30/2019  PREOPERATIVE DIAGNOSES:  Urinary retention, prostatic hypertrophy, metastatic prostate cancer.  PROCEDURE:  Transurethral resection of the prostate.  ESTIMATED BLOOD LOSS:  Nil.  COMPLICATIONS:  None.  SPECIMENS:  None.  FINDINGS: 1.  Modest bilobar prostatic hypertrophy pre-resection. 2.  Open prostatic fossa channel from the bladder neck to verumontanum post-resection. 3.  Moderate bladder trabeculation.  CATHETER:   1.  24-French 3-way Foley catheter, 20 mL sterile in the balloon. 2.  Normal saline irrigation.  INDICATIONS:  The patient is a very pleasant, but quite comorbid 69 year old man with history of metastatic prostate cancer, initially treated at another institution.  He has moved now to [place].  We resumed management of this.  He has since  developed urinary retention.  This has been refractory to medical therapy.  He does have a history of normal bladder emptying previously suggesting intact detrusor function.  Cystoscopy ruled out any evidence of stricture disease, only modest bilobar  prostatic hypertrophy.  Options were discussed including chronic self-catheterization or the chronic Foleys versus outlet procedures of various types.  He has a history of Xarelto use for AFib and DVT.  It was felt that in balance of all competing risks,  the most prudent way to proceed would be a channel TURP using vaporization technique with goal of getting him catheter free, but hopefully, avoiding significant bleeding complications.  He has been holding Xarelto briefly as per cardiology  recommendations.  Informed consent was obtained and placed in medical record.  DESCRIPTION OF PROCEDURE:  Patient being identified, procedure being transection of prostate was  confirmed.  Procedure timeout was performed.  Anesthesia general LMA anesthesia induced.  The patient was placed into a low lithotomy position, sterile field  was created by prepping and draping his penis, perineum and proximal thighs using iodine.  Cystourethroscopy was performed using 21-French rigid cystoscope with offset lens.  Inspection of anterior and posterior urethra revealed some modest bilobar  prostatic hypertrophy with kissing lobes.  Inspection of bladder revealed no diverticula, calcifications, papillary lesions.  Ureteral orifices were singleton bilaterally.  There was moderate trabeculation.  Cystoscope was then exchanged for a 26-French  resectoscope sheath with visual obturator and using the button electrode, very careful channel TURP vaporization technique was performed in a top-down orientation.  First, at the 12 o'clock position, then of the right lobe, then of the left lobe, then  the bladder neck to the verumontanum creating an open urinary channel, but purposely not resecting very deeply towards the prostatic capsule and purposely minimizing apical dissection.  Following this, there was resolution of the kissing lobe anatomy.   Hemostasis was remarkably good.  A sensor wire was placed via the resectoscope into the urinary bladder over which a new 24-French 3-way Foley catheter was placed.  20 mL sterile water in the balloon.  This was connected to normal saline irrigation until  efflux was essentially clear.  The procedure was terminated.  The patient tolerated the procedure well.  No immediate complications.  The patient was taken to postanesthesia care in stable condition.  Plan for observation admission and likely discharge home this afternoon versus tomorrow morning.  CN/NUANCE  D:09/30/2019 T:09/30/2019 JOB:[id number]

## 2019-09-30 NOTE — H&P (Signed)
Tommy Yang is an 69 y.o. male.    Chief Complaint: Pre-Op Transurethral Resection of Prostate  HPI:   1 - Metastatic Recurrent Prostate Cancer - s/p XRT/Hormones 2015 for mod risk disese, then PSA recurrence with large volume Grade 5 cancer by BX 2018. PET 2019 with non-bulky Lt. ext iliac nodes.   Most Recent Restaging:  08/2019 - CT no gross mets, improved Lt ext. iliac nodes   Recent Course:  03/2018 Lupron 45  12/2018 PSA 0.09 / T <10 ==> Lupron 45; 06/2019 PSA 0.131 / T 22 ==> Eligard 45   2 - Urolithiasis - LLP 48mm renal sotne incidental on PET-CT 2019. NO h/o colic. NO hydro.   3 - Urinary Retention - new frank retention with ARF 08/2019. Catheter placed, failed trial of void x 3. CT 33mL prostate. Cysto 08/2019 with modest bilobar prostate hypertrophy and XRT cystitis changes only. Placed on BID tamsulosin.    PMH sig for morbid obesity, DM2 (A1c 8-10), DVT/Xarelto. He is retired Chief Financial Officer from [employer], went to [employer], most of his career in [place], now [employer] for retirement. His PCP is Tommy Capuchin NP.   Today " Tommy Yang " is seen to proceed with channel TURP in effort to get him back out of retention. No interval fevers. He has held Xarelto as instructed by cardiology. C 19 screen negative.     Past Medical History:  Diagnosis Date  . Arthritis   . Atrial fibrillation ([place])   . Diabetes mellitus without complication ([place])    type 2  . DVT (deep venous thrombosis) ([place]) 2015   left leg.  . Foley catheter in place last changed 2eeks ago   last 7 months  . GERD (gastroesophageal reflux disease)   . Hx of radiation therapy   . Hypercholesterolemia   . Hypertension   . Prostate cancer ([place]) 12/06/13   gleason 4+3=7, 6/12 cores positive. seed implant and radiation  . Sleep apnea    no cpap used  . Urinary retention     Past Surgical History:  Procedure Laterality Date  . APPENDECTOMY    . BALLOON DILATION N/A 08/02/2015   Procedure: BALLOON DILATION;   Surgeon: [name], MD;  Location: [facility];  Service: Endoscopy;  Laterality: N/A;  . CARDIOVERSION N/A 05/13/2018   Procedure: CARDIOVERSION;  Surgeon: Sanda Klein, MD;  Location: [facility];  Service: Cardiovascular;  Laterality: N/A;  . CARDIOVERSION N/A 05/18/2018   Procedure: CARDIOVERSION;  Surgeon: Buford Dresser, MD;  Location: [facility];  Service: Cardiovascular;  Laterality: N/A;  . COLONOSCOPY WITH PROPOFOL N/A 08/02/2015   Procedure: COLONOSCOPY WITH PROPOFOL w/ APC;  Surgeon: [name], MD;  Location: [facility];  Service: Endoscopy;  Laterality: N/A;  . ESOPHAGOGASTRODUODENOSCOPY (EGD) WITH PROPOFOL N/A 08/02/2015   Procedure: ESOPHAGOGASTRODUODENOSCOPY (EGD) WITH PROPOFOL/ possible dilation.;  Surgeon: [name], MD;  Location: [facility];  Service: Endoscopy;  Laterality: N/A;  . KNEE SURGERY     right knee orthoscopic  . PROSTATE BIOPSY  12/06/13   Gleason 4+3=7, volume 35 gm  . [place]   Tourette'ssyndrome spinal fluid removed   . TONSILLECTOMY  as child    Family History  Problem Relation Age of Onset  . Heart disease Mother        before age 66  . Diabetes Mother   . Hyperlipidemia Mother   . Varicose Veins Mother   . Hypertension Mother   . Heart disease  Father   . Diabetes Father   . Hyperlipidemia Father   . Hypertension Father   . Cancer Brother   . COPD Brother   . Diabetes Brother   . Hyperlipidemia Brother   . Hypertension Brother    Social History:  reports that he quit smoking about 7 years ago. His smoking use included cigarettes. He has a 11.50 pack-year smoking history. He has never used smokeless tobacco. He reports current alcohol use. He reports that he does not use drugs.  Allergies: No Known Allergies  No medications prior to admission.    No results found for this or any previous visit (from the past 48 hour(s)). No results found.  Review of Systems  Constitutional:  Negative for chills and fatigue.  Genitourinary: Positive for hematuria.  All other systems reviewed and are negative.   Height 6' (1.829 m), weight 133.8 kg. Physical Exam  Constitutional: He appears well-developed.  Obese, very pleasant, at baseline.   Eyes: Pupils are equal, round, and reactive to light.  Cardiovascular: Normal rate.  Respiratory: Effort normal.  GI: Soft.  Genitourinary:    Genitourinary Comments: Small bore foley in place with medium yelllow urine.    Musculoskeletal:        General: Normal range of motion.     Cervical back: Normal range of motion.  Neurological: He is alert.  Skin: Skin is warm.  Psychiatric: He has a normal mood and affect.     Assessment/Plan  Proceed as planned with channel TURP. Risks, benefits, alternatives, expected peri-op course discussed previously and reiterated today.   Alexis Frock, MD 09/30/2019, 5:16 AM

## 2019-09-30 NOTE — Anesthesia Procedure Notes (Signed)
Procedure Name: LMA Insertion Date/Time: 09/30/2019 7:30 AM Performed by: Lavina Hamman, CRNA Pre-anesthesia Checklist: Patient identified, Emergency Drugs available, Suction available and Patient being monitored Patient Re-evaluated:Patient Re-evaluated prior to induction Oxygen Delivery Method: Circle System Utilized Preoxygenation: Pre-oxygenation with 100% oxygen Induction Type: IV induction Ventilation: Mask ventilation without difficulty LMA: LMA inserted LMA Size: 5.0 Number of attempts: 1 Airway Equipment and Method: Bite block Placement Confirmation: positive ETCO2 Tube secured with: Tape Dental Injury: Teeth and Oropharynx as per pre-operative assessment

## 2019-10-01 DIAGNOSIS — R338 Other retention of urine: Secondary | ICD-10-CM | POA: Diagnosis not present

## 2019-10-01 DIAGNOSIS — C775 Secondary and unspecified malignant neoplasm of intrapelvic lymph nodes: Secondary | ICD-10-CM | POA: Diagnosis not present

## 2019-10-01 DIAGNOSIS — I4891 Unspecified atrial fibrillation: Secondary | ICD-10-CM | POA: Diagnosis not present

## 2019-10-01 DIAGNOSIS — E7800 Pure hypercholesterolemia, unspecified: Secondary | ICD-10-CM | POA: Diagnosis not present

## 2019-10-01 DIAGNOSIS — M1990 Unspecified osteoarthritis, unspecified site: Secondary | ICD-10-CM | POA: Diagnosis not present

## 2019-10-01 DIAGNOSIS — I10 Essential (primary) hypertension: Secondary | ICD-10-CM | POA: Diagnosis not present

## 2019-10-01 DIAGNOSIS — G4730 Sleep apnea, unspecified: Secondary | ICD-10-CM | POA: Diagnosis not present

## 2019-10-01 DIAGNOSIS — Z923 Personal history of irradiation: Secondary | ICD-10-CM | POA: Diagnosis not present

## 2019-10-01 DIAGNOSIS — Z86718 Personal history of other venous thrombosis and embolism: Secondary | ICD-10-CM | POA: Diagnosis not present

## 2019-10-01 DIAGNOSIS — N3289 Other specified disorders of bladder: Secondary | ICD-10-CM | POA: Diagnosis not present

## 2019-10-01 DIAGNOSIS — C7951 Secondary malignant neoplasm of bone: Secondary | ICD-10-CM | POA: Diagnosis not present

## 2019-10-01 DIAGNOSIS — N401 Enlarged prostate with lower urinary tract symptoms: Secondary | ICD-10-CM | POA: Diagnosis not present

## 2019-10-01 DIAGNOSIS — C61 Malignant neoplasm of prostate: Secondary | ICD-10-CM | POA: Diagnosis not present

## 2019-10-01 DIAGNOSIS — E119 Type 2 diabetes mellitus without complications: Secondary | ICD-10-CM | POA: Diagnosis not present

## 2019-10-01 DIAGNOSIS — K219 Gastro-esophageal reflux disease without esophagitis: Secondary | ICD-10-CM | POA: Diagnosis not present

## 2019-10-01 LAB — GLUCOSE, CAPILLARY: Glucose-Capillary: 139 mg/dL — ABNORMAL HIGH (ref 70–99)

## 2019-10-01 MED ORDER — ACETAMINOPHEN 500 MG PO TABS
ORAL_TABLET | ORAL | Status: AC
  Filled 2019-10-01: qty 2

## 2019-10-01 MED ORDER — HEPARIN SODIUM (PORCINE) 5000 UNIT/ML IJ SOLN
INTRAMUSCULAR | Status: AC
  Filled 2019-10-01: qty 1

## 2019-10-01 NOTE — Discharge Summary (Signed)
Date of admission: 09/30/2019  Date of discharge: 10/01/2019  Admission diagnosis: Urinary retention, prostate cancer  Discharge diagnosis: Same  Procedures: TURP  History and Physical: For full details, please see admission history and physical. Briefly, Tommy Yang is a 69 y.o. year old patient with a history of prostate cancer status post XRT with ADT in 2015.  Patient has had ongoing issues with urinary retention requiring multiple Foley catheters.  He is status post channel TURP on 09/30/2019 with Dr. Tresa Moore.  Hospital Course: The patient was monitored on the floor and had a routine postoperative course.  He was discharged home with his Foley catheter with plans to follow-up on 10/04/2019 for a voiding trial.  Physical Exam:  General: Alert and oriented CV: RRR, palpable distal pulses Lungs: CTAB, equal chest rise Abdomen: Soft, NTND, no rebound or guarding GU: Foley catheter in place and draining clear to light pink urine with no CBI Ext: NT, No erythema  Laboratory values:  Recent Labs    09/30/19 0659  HGB 13.3  HCT 39.0   Recent Labs    09/30/19 0659  CREATININE 1.00    Disposition: Home  Discharge instruction: The patient was instructed to be ambulatory but told to refrain from heavy lifting, strenuous activity, or driving.  Discharge medications:  Allergies as of 10/01/2019   No Known Allergies     Medication List    TAKE these medications   amLODipine 2.5 MG tablet Commonly known as: NORVASC Take 1 tablet (2.5 mg total) by mouth daily.   atorvastatin 40 MG tablet Commonly known as: LIPITOR TAKE 1 TABLET BY MOUTH EVERY DAY   blood glucose meter kit and supplies Kit Test blood sugar daily as directed. Dx code: E11.9   cephALEXin 500 MG capsule Commonly known as: Keflex Take 1 capsule (500 mg total) by mouth 2 (two) times daily. X 5 days to prevent post-op infection Notes to patient: Take first dose when you get home   CINNAMON PO Take 1,000 mg  by mouth daily.   Coral Calcium 1000 (390 Ca) MG Tabs Take 1,000 mg by mouth daily.   glucose blood test strip Commonly known as: Accu-Chek Guide Use as instructed to check blood sugar twice daily.   Invokamet XR [phone] MG Tb24 Generic drug: Canagliflozin-metFORMIN HCl ER daily. Taking 2 tablets in the am   leuprolide 11.25 MG injection Commonly known as: LUPRON Inject 11.25 mg into the muscle every 6 (six) months. Took oct 2020   losartan 50 MG tablet Commonly known as: COZAAR Take 1 tablet (50 mg total) by mouth daily.   metoprolol tartrate 100 MG tablet Commonly known as: LOPRESSOR TAKE 1 TABLET BY MOUTH TWICE A DAY   oxyCODONE-acetaminophen 5-325 MG tablet Commonly known as: Percocet Take 1-2 tablets by mouth every 8 (eight) hours as needed for moderate pain or severe pain. Post-operatively   Ozempic (1 MG/DOSE) 2 MG/1.5ML Sopn Generic drug: Semaglutide (1 MG/DOSE) INJECT 1MG UNDER THE SKIN ONCE WEEKLY. What changed: See the new instructions.   pantoprazole 40 MG tablet Commonly known as: PROTONIX Take 1 tablet (40 mg total) by mouth daily.   rivaroxaban 20 MG Tabs tablet Commonly known as: XARELTO Take 20 mg by mouth daily before breakfast.   senna-docusate 8.6-50 MG tablet Commonly known as: Senokot-S Take 1 tablet by mouth 2 (two) times daily. While taking strong pain meds to prevent constipation.   tamsulosin 0.4 MG Caps capsule Commonly known as: FLOMAX Take 1 capsule (0.4 mg total) by  mouth daily. What changed: when to take this   Turmeric 450 MG Caps Take 450 mg by mouth daily after breakfast.   Vitamin D (Cholecalciferol) 10 MCG (400 UNIT) Tabs Take 400 Units by mouth daily.   ZINC PO Take 1 tablet by mouth daily.       Followup:  Follow-up Information    Alexis Frock, MD On 10/04/2019.   Specialty: Urology Why: at 8:15 for MD visit and catheter removal.  Contact information: [place] [id number] [phone]

## 2019-10-01 NOTE — Discharge Instructions (Signed)
1 - You may have urinary urgency (bladder spasms) and bloody urine on / off for up to 2 weeks. This is normal.  2 - Call MD or go to ER for fever >102, severe pain / nausea / vomiting not relieved by medications, or acute change in medical status  Indwelling Urinary Catheter Care, Adult An indwelling urinary catheter is a thin tube that is put into your bladder. The tube helps to drain pee (urine) out of your body. The tube goes in through your urethra. Your urethra is where pee comes out of your body. Your pee will come out through the catheter, then it will go into a bag (drainage bag). Take good care of your catheter so it will work well. How to wear your catheter and bag Supplies needed Sticky tape (adhesive tape) or a leg strap. Alcohol wipe or soap and water (if you use tape). A clean towel (if you use tape). Large overnight bag. Smaller bag (leg bag). Wearing your catheter Attach your catheter to your leg with tape or a leg strap. Make sure the catheter is not pulled tight. If a leg strap gets wet, take it off and put on a dry strap. If you use tape to hold the bag on your leg: Use an alcohol wipe or soap and water to wash your skin where the tape made it sticky before. Use a clean towel to pat-dry that skin. Use new tape to make the bag stay on your leg. Wearing your bags You should have been given a large overnight bag. You may wear the overnight bag in the day or night. Always have the overnight bag lower than your bladder.  Do not let the bag touch the floor. Before you go to sleep, put a clean plastic bag in a wastebasket. Then hang the overnight bag inside the wastebasket. You should also have a smaller leg bag that fits under your clothes. Always wear the leg bag below your knee. Do not wear your leg bag at night. How to care for your skin and catheter Supplies needed A clean washcloth. Water and mild soap. A clean towel. Caring for your skin and catheter      Clean the skin around your catheter every day: Wash your hands with soap and water. Wet a clean washcloth in warm water and mild soap. Clean the skin around your urethra. If you are male: Gently spread the folds of skin around your vagina (labia). With the washcloth in your other hand, wipe the inner side of your labia on each side. Wipe from front to back. If you are male: Pull back any skin that covers the end of your penis (foreskin). With the washcloth in your other hand, wipe your penis in small circles. Start wiping at the tip of your penis, then move away from the catheter. Move the foreskin back in place, if needed. With your free hand, hold the catheter close to where it goes into your body. Keep holding the catheter during cleaning so it does not get pulled out. With the washcloth in your other hand, clean the catheter. Only wipe downward on the catheter. Do not wipe upward toward your body. Doing this may push germs into your urethra and cause infection. Use a clean towel to pat-dry the catheter and the skin around it. Make sure to wipe off all soap. Wash your hands with soap and water. Shower every day. Do not take baths. Do not use cream, ointment, or lotion on the  area where the catheter goes into your body, unless your doctor tells you to. Do not use powders, sprays, or lotions on your genital area. Check your skin around the catheter every day for signs of infection. Check for: Redness, swelling, or pain. Fluid or blood. Warmth. Pus or a bad smell. How to empty the bag Supplies needed Rubbing alcohol. Gauze pad or cotton ball. Tape or a leg strap. Emptying the bag Pour the pee out of your bag when it is ?- full, or at least 2-3 times a day. Do this for your overnight bag and your leg bag. Wash your hands with soap and water. Separate (detach) the bag from your leg. Hold the bag over the toilet or a clean pail. Keep the bag lower than your hips and bladder.  This is so the pee (urine) does not go back into the tube. Open the pour spout. It is at the bottom of the bag. Empty the pee into the toilet or pail. Do not let the pour spout touch any surface. Put rubbing alcohol on a gauze pad or cotton ball. Use the gauze pad or cotton ball to clean the pour spout. Close the pour spout. Attach the bag to your leg with tape or a leg strap. Wash your hands with soap and water. Follow instructions for cleaning the drainage bag: From the product maker. As told by your doctor. How to change the bag Supplies needed Alcohol wipes. A clean bag. Tape or a leg strap. Changing the bag Replace your bag when it starts to leak, smell bad, or look dirty. Wash your hands with soap and water. Separate the dirty bag from your leg. Pinch the catheter with your fingers so that pee does not spill out. Separate the catheter tube from the bag tube where these tubes connect (at the connection valve). Do not let the tubes touch any surface. Clean the end of the catheter tube with an alcohol wipe. Use a different alcohol wipe to clean the end of the bag tube. Connect the catheter tube to the tube of the clean bag. Attach the clean bag to your leg with tape or a leg strap. Do not make the bag tight on your leg. Wash your hands with soap and water. General rules  Never pull on your catheter. Never try to take it out. Doing that can hurt you. Always wash your hands before and after you touch your catheter or bag. Use a mild, fragrance-free soap. If you do not have soap and water, use hand sanitizer. Always make sure there are no twists or bends (kinks) in the catheter tube. Always make sure there are no leaks in the catheter or bag. Drink enough fluid to keep your pee pale yellow. Do not take baths, swim, or use a hot tub. If you are male, wipe from front to back after you poop (have a bowel movement). Contact a doctor if: Your pee is cloudy. Your pee smells worse  than usual. Your catheter gets clogged. Your catheter leaks. Your bladder feels full. Get help right away if: You have redness, swelling, or pain where the catheter goes into your body. You have fluid, blood, pus, or a bad smell coming from the area where the catheter goes into your body. Your skin feels warm where the catheter goes into your body. You have a fever. You have pain in your: Belly (abdomen). Legs. Lower back. Bladder. You see blood in the catheter. Your pee is pink or red. You  feel sick to your stomach (nauseous). You throw up (vomit). You have chills. Your pee is not draining into the bag. Your catheter gets pulled out. Summary An indwelling urinary catheter is a thin tube that is placed into the bladder to help drain pee (urine) out of the body. The catheter is placed into the part of the body that drains pee from the bladder (urethra). Taking good care of your catheter will keep it working properly and help prevent problems. Always wash your hands before and after touching your catheter or bag. Never pull on your catheter or try to take it out. This information is not intended to replace advice given to you by your health care provider. Make sure you discuss any questions you have with your health care provider. Document Revised: 12/10/2018 Document Reviewed: 04/03/2017 Elsevier Patient Education  [place].  Transurethral Resection of the Prostate, Care After This sheet gives you information about how to care for yourself after your procedure. Your health care provider may also give you more specific instructions. If you have problems or questions, contact your health care provider. What can I expect after the procedure? After the procedure, it is common to have:  Mild pain in your lower abdomen.  Soreness or mild discomfort in your penis from having the catheter inserted during the procedure.  A feeling of urgency when you need to urinate.  A  small amount of blood in your urine. You may notice some small blood clots in your urine. These are normal. Follow these instructions at home: Medicines  Take over-the-counter and prescription medicines only as told by your health care provider.  If you were prescribed an antibiotic medicine, take it as told by your health care provider. Do not stop taking the antibiotic even if you start to feel better.  Ask your health care provider if the medicine prescribed to you: ? Requires you to avoid driving or using heavy machinery. ? Can cause constipation. You may need to take actions to prevent or treat constipation, such as:  Take over-the-counter or prescription medicines.  Eat foods that are high in fiber, such as fresh fruits and vegetables, whole grains, and beans.  Limit foods that are high in fat and processed sugars, such as fried or sweet foods.  Do not drive for 24 hours if you were given a sedative during your procedure. Activity   Return to your normal activities as told by your health care provider. Ask your health care provider what activities are safe for you.  Do not lift anything that is heavier than 10 lb (4.5 kg), or the limit that you are told, for 3 weeks after the procedure or until your health care provider says that it is safe.  Avoid intense physical activity for as long as told by your health care provider.  Avoid sitting for a long time without moving. Get up and move around one or more times every few hours. This helps to prevent blood clots. You may increase your physical activity gradually as you start to feel better. Lifestyle  Do not drink alcohol for as long as told by your health care provider. This is especially important if you are taking prescription pain medicines.  Do not engage in sexual activity until your health care provider says that you can do this. General instructions   Do not take baths, swim, or use a hot tub until your health care  provider approves.  Drink enough fluid to keep your urine  pale yellow.  Urinate as soon as you feel the need to. Do not try to hold your urine for long periods of time.  If your health care provider approves, you may take a stool softener for 2-3 weeks to prevent you from straining to have a bowel movement.  Wear compression stockings as told by your health care provider. These stockings help to prevent blood clots and reduce swelling in your legs.  Keep all follow-up visits as told by your health care provider. This is important. Contact a health care provider if you have:  Difficulty urinating.  A fever.  Pain that gets worse or does not improve with medicine.  Blood in your urine that does not go away after 1 week of resting and drinking more fluids.  Swelling in your penis or testicles. Get help right away if:  You are unable to urinate.  You are having more blood clots in your urine instead of fewer.  You have: ? Large blood clots. ? A lot of blood in your urine. ? Pain in your back or lower abdomen. ? Pain or swelling in your legs. ? Chills and you are shaking. ? Difficulty breathing or shortness of breath. Summary  After the procedure, it is common to have a small amount of blood in your urine.  Avoid heavy lifting and intense physical activity for as long as told by your health care provider.  Urinate as soon as you feel the need to. Do not try to hold your urine for long periods of time.  Keep all follow-up visits as told by your health care provider. This is important. This information is not intended to replace advice given to you by your health care provider. Make sure you discuss any questions you have with your health care provider. Document Revised: 12/08/2018 Document Reviewed: 05/19/2018 Elsevier Patient Education  [place].

## 2019-10-03 LAB — GLUCOSE, CAPILLARY: Glucose-Capillary: 186 mg/dL — ABNORMAL HIGH (ref 70–99)

## 2019-10-04 ENCOUNTER — Emergency Department (HOSPITAL_COMMUNITY)
Admission: EM | Admit: 2019-10-04 | Discharge: 2019-10-05 | Disposition: A | Discharge status: Home / Self Care | Payer: BC Managed Care – PPO | Attending: Emergency Medicine | Admitting: Emergency Medicine

## 2019-10-04 ENCOUNTER — Other Ambulatory Visit: Payer: Self-pay

## 2019-10-04 ENCOUNTER — Encounter (HOSPITAL_COMMUNITY): Payer: Self-pay | Admitting: Emergency Medicine

## 2019-10-04 DIAGNOSIS — Z79899 Other long term (current) drug therapy: Secondary | ICD-10-CM | POA: Insufficient documentation

## 2019-10-04 DIAGNOSIS — R339 Retention of urine, unspecified: Secondary | ICD-10-CM | POA: Insufficient documentation

## 2019-10-04 DIAGNOSIS — Z8546 Personal history of malignant neoplasm of prostate: Secondary | ICD-10-CM | POA: Diagnosis not present

## 2019-10-04 DIAGNOSIS — R338 Other retention of urine: Secondary | ICD-10-CM | POA: Diagnosis not present

## 2019-10-04 DIAGNOSIS — E119 Type 2 diabetes mellitus without complications: Secondary | ICD-10-CM | POA: Insufficient documentation

## 2019-10-04 DIAGNOSIS — I10 Essential (primary) hypertension: Secondary | ICD-10-CM | POA: Insufficient documentation

## 2019-10-04 DIAGNOSIS — Z7901 Long term (current) use of anticoagulants: Secondary | ICD-10-CM | POA: Diagnosis not present

## 2019-10-04 LAB — CBC WITH DIFFERENTIAL/PLATELET
Abs Immature Granulocytes: 0.03 10*3/uL (ref 0.00–0.07)
Basophils Absolute: 0.1 10*3/uL (ref 0.0–0.1)
Basophils Relative: 0 %
Eosinophils Absolute: 0.2 10*3/uL (ref 0.0–0.5)
Eosinophils Relative: 1 %
HCT: 40.1 % (ref 39.0–52.0)
Hemoglobin: 13 g/dL (ref 13.0–17.0)
Immature Granulocytes: 0 %
Lymphocytes Relative: 22 %
Lymphs Abs: 2.6 10*3/uL (ref 0.7–4.0)
MCH: 27.1 pg (ref 26.0–34.0)
MCHC: 32.4 g/dL (ref 30.0–36.0)
MCV: 83.7 fL (ref 80.0–100.0)
Monocytes Absolute: 1 10*3/uL (ref 0.1–1.0)
Monocytes Relative: 9 %
Neutro Abs: 7.6 10*3/uL (ref 1.7–7.7)
Neutrophils Relative %: 68 %
Platelets: 255 10*3/uL (ref 150–400)
RBC: 4.79 MIL/uL (ref 4.22–5.81)
RDW: 15 % (ref 11.5–15.5)
WBC: 11.4 10*3/uL — ABNORMAL HIGH (ref 4.0–10.5)
nRBC: 0 % (ref 0.0–0.2)

## 2019-10-04 LAB — BASIC METABOLIC PANEL
Anion gap: 15 (ref 5–15)
BUN: 28 mg/dL — ABNORMAL HIGH (ref 8–23)
CO2: 20 mmol/L — ABNORMAL LOW (ref 22–32)
Calcium: 9.1 mg/dL (ref 8.9–10.3)
Chloride: 103 mmol/L (ref 98–111)
Creatinine, Ser: 1.58 mg/dL — ABNORMAL HIGH (ref 0.61–1.24)
GFR calc Af Amer: 52 mL/min — ABNORMAL LOW (ref >60)
GFR calc non Af Amer: 45 mL/min — ABNORMAL LOW (ref >60)
Glucose, Bld: 184 mg/dL — ABNORMAL HIGH (ref 70–99)
Potassium: 4.2 mmol/L (ref 3.5–5.1)
Sodium: 138 mmol/L (ref 135–145)

## 2019-10-04 LAB — URINALYSIS, ROUTINE W REFLEX MICROSCOPIC: RBC / HPF: >50 RBC/hpf — ABNORMAL HIGH (ref 0–5)

## 2019-10-04 NOTE — ED Provider Notes (Signed)
[facility] DEPT Provider Note   CSN: [id number] Arrival date & time: 10/04/19  2004     History Chief Complaint  Patient presents with  . Urinary Retention    Tommy Yang is a 69 y.o. male.  Patient presents with inability to urinate.  Had a catheter removed in the urology office today.  Is not been able to urinate since 8 AM.  Catheter was placed by nursing staff prior to my evaluation and is draining bloody urine and patient is feeling better.  Patient underwent a TURP procedure on January 29 by Dr. Tresa Moore.  States he had a catheter for 7 weeks prior to this.  The catheter was removed in the office this morning.  Patient states the staff did not irrigate the catheter before removing it.  He does take Xarelto for history of atrial fibrillation and DVT.  He states his pain is resolved with catheter in place.  He is now having bloody urine which he did not have when the catheter was removed this morning.  He denies any fever, chills, nausea, vomiting.  No chest pain or shortness of breath.  No dizziness or lightheadedness.  The history is provided by the patient.       Past Medical History:  Diagnosis Date  . Arthritis   . Atrial fibrillation ([place])   . Diabetes mellitus without complication ([place])    type 2  . DVT (deep venous thrombosis) ([place]) 2015   left leg.  . Foley catheter in place last changed 2eeks ago   last 7 months  . GERD (gastroesophageal reflux disease)   . Hx of radiation therapy   . Hypercholesterolemia   . Hypertension   . Prostate cancer ([place]) 12/06/13   gleason 4+3=7, 6/12 cores positive. seed implant and radiation  . Sleep apnea    no cpap used  . Urinary retention     Patient Active Problem List   Diagnosis Date Noted  . Urinary retention 09/30/2019  . Bilateral hydronephrosis 08/12/2019  . Obstructive uropathy 08/11/2019  . Acute lower UTI 08/11/2019  . AKI (acute kidney injury) ([place]) 08/11/2019  . Acute urinary  retention 08/11/2019  . Dysuria 08/10/2019  . Constipation 08/10/2019  . Low back pain 08/10/2019  . Paroxysmal atrial fibrillation with RVR ([place]) 05/23/2019  . Paroxysmal atrial fibrillation ([place]) 05/23/2019  . Encounter for well adult exam with abnormal findings 02/24/2019  . Leg wound, right 02/24/2019  . Essential hypertension 09/24/2018  . Persistent atrial fibrillation   . Microalbuminuria 04/23/2016  . Atrial fibrillation ([place]) 12/29/2015  . Rectal bleeding 07/18/2015  . Dysphagia 07/18/2015  . Chronic anticoagulation 03/14/2015  . Morbid obesity ([place]) 03/14/2015  . Exertional shortness of breath 01/12/2015  . Abnormal ECG 01/12/2015  . Encounter for screening colonoscopy 05/09/2014  . Esophageal reflux 05/09/2014  . Other dysphagia 05/09/2014  . Evaluate for OSA (obstructive sleep apnea) 02/28/2014  . Hyperlipidemia 02/28/2014  . Prostate cancer ([place]) 12/14/2013  . Erectile dysfunction 10/26/2013  . Type II diabetes mellitus, uncontrolled ([place]) 10/19/2013  . DVT (deep venous thrombosis) ([place])   . Left leg DVT ([place]) 09/28/2013  . Leg edema, left 09/28/2013    Past Surgical History:  Procedure Laterality Date  . APPENDECTOMY    . BALLOON DILATION N/A 08/02/2015   Procedure: BALLOON DILATION;  Surgeon: [name], MD;  Location: [facility];  Service: Endoscopy;  Laterality: N/A;  . CARDIOVERSION N/A 05/13/2018   Procedure: CARDIOVERSION;  Surgeon: Sanda Klein, MD;  Location: [facility];  Service: Cardiovascular;  Laterality: N/A;  . CARDIOVERSION N/A 05/18/2018   Procedure: CARDIOVERSION;  Surgeon: Buford Dresser, MD;  Location: [facility];  Service: Cardiovascular;  Laterality: N/A;  . COLONOSCOPY WITH PROPOFOL N/A 08/02/2015   Procedure: COLONOSCOPY WITH PROPOFOL w/ APC;  Surgeon: [name], MD;  Location: [facility];  Service: Endoscopy;  Laterality: N/A;  . ESOPHAGOGASTRODUODENOSCOPY (EGD) WITH PROPOFOL N/A 08/02/2015   Procedure:  ESOPHAGOGASTRODUODENOSCOPY (EGD) WITH PROPOFOL/ possible dilation.;  Surgeon: [name], MD;  Location: [facility];  Service: Endoscopy;  Laterality: N/A;  . KNEE SURGERY     right knee orthoscopic  . PROSTATE BIOPSY  12/06/13   Gleason 4+3=7, volume 35 gm  . [place]   Tourette'ssyndrome spinal fluid removed   . TONSILLECTOMY  as child  . TRANSURETHRAL RESECTION OF PROSTATE N/A 09/30/2019   Procedure: TRANSURETHRAL RESECTION OF THE PROSTATE (TURP);  Surgeon: Alexis Frock, MD;  Location: [facility];  Service: Urology;  Laterality: N/A;  1 HR       Family History  Problem Relation Age of Onset  . Heart disease Mother        before age 35  . Diabetes Mother   . Hyperlipidemia Mother   . Varicose Veins Mother   . Hypertension Mother   . Heart disease Father   . Diabetes Father   . Hyperlipidemia Father   . Hypertension Father   . Cancer Brother   . COPD Brother   . Diabetes Brother   . Hyperlipidemia Brother   . Hypertension Brother     Social History   Tobacco Use  . Smoking status: Former Smoker    Packs/day: 0.50    Years: 23.00    Pack years: 11.50    Types: Cigarettes    Quit date: 08/01/2012    Years since quitting: 7.1  . Smokeless tobacco: Never Used  Substance Use Topics  . Alcohol use: Yes    Alcohol/week: 0.0 standard drinks    Comment: very seldom, maybe 6x per year  . Drug use: Never    Home Medications Prior to Admission medications   Medication Sig Start Date End Date Taking? Authorizing Provider  amLODipine (NORVASC) 2.5 MG tablet Take 1 tablet (2.5 mg total) by mouth daily. 02/24/19   [name], MD  atorvastatin (LIPITOR) 40 MG tablet TAKE 1 TABLET BY MOUTH EVERY DAY 09/22/19   [name], MD  blood glucose meter kit and supplies KIT Test blood sugar daily as directed. Dx code: E11.9 01/23/15   Darlyne Russian, MD  cephALEXin (KEFLEX) 500 MG capsule Take 1 capsule (500 mg total) by mouth 2 (two) times  daily. X 5 days to prevent post-op infection 09/30/19   Alexis Frock, MD  CINNAMON PO Take 1,000 mg by mouth daily.    [provider]  Coral Calcium 1000 (390 Ca) MG TABS Take 1,000 mg by mouth daily.    [provider]  glucose blood (ACCU-CHEK GUIDE) test strip Use as instructed to check blood sugar twice daily. 10/29/18   Elayne Snare, MD  INVOKAMET XR [phone] MG TB24 daily. Taking 2 tablets in the am 08/15/19   [provider]  leuprolide (LUPRON) 11.25 MG injection Inject 11.25 mg into the muscle every 6 (six) months. Took oct 2020    [provider]  losartan (COZAAR) 50 MG tablet Take 1 tablet (50 mg total) by mouth daily. 05/16/19   Jenny Reichmann,  Hunt Oris, MD  metoprolol tartrate (LOPRESSOR) 100 MG tablet TAKE 1 TABLET BY MOUTH TWICE A DAY 08/03/19   Sherran Needs, NP  Multiple Vitamins-Minerals (ZINC PO) Take 1 tablet by mouth daily.    [provider]  oxyCODONE-acetaminophen (PERCOCET) 5-325 MG tablet Take 1-2 tablets by mouth every 8 (eight) hours as needed for moderate pain or severe pain. Post-operatively 09/30/19 09/29/20  Alexis Frock, MD  OZEMPIC, 1 MG/DOSE, 2 MG/1.5ML SOPN INJECT 1MG UNDER THE SKIN ONCE WEEKLY. Patient taking differently: Inject 1 mg into the skin once a week. fridays 06/28/19   Elayne Snare, MD  pantoprazole (PROTONIX) 40 MG tablet Take 1 tablet (40 mg total) by mouth daily. 02/24/19   [name], MD  rivaroxaban (XARELTO) 20 MG TABS tablet Take 20 mg by mouth daily before breakfast.    [provider]  senna-docusate (SENOKOT-S) 8.6-50 MG tablet Take 1 tablet by mouth 2 (two) times daily. While taking strong pain meds to prevent constipation. 09/30/19   Alexis Frock, MD  tamsulosin (FLOMAX) 0.4 MG CAPS capsule Take 1 capsule (0.4 mg total) by mouth daily. Patient taking differently: Take 0.4 mg by mouth 2 (two) times daily.  08/14/19   Shawna Clamp, MD  Turmeric 450 MG CAPS Take 450 mg by mouth daily after  breakfast.     [provider]  Vitamin D, Cholecalciferol, 400 UNITS TABS Take 400 Units by mouth daily.    [provider]    Allergies    Patient has no known allergies.  Review of Systems   Review of Systems  Constitutional: Positive for fatigue. Negative for activity change, appetite change and fever.  HENT: Negative for congestion and rhinorrhea.   Cardiovascular: Negative for chest pain.  Gastrointestinal: Negative for abdominal pain, nausea and vomiting.  Genitourinary: Positive for decreased urine volume, difficulty urinating and hematuria. Negative for flank pain.  Musculoskeletal: Negative for arthralgias, back pain and myalgias.  Skin: Negative for rash.  Neurological: Negative for dizziness, light-headedness and headaches.   all other systems are negative except as noted in the HPI and PMH.    Physical Exam Updated Vital Signs BP (!) 132/109 (BP Location: Left Arm)   Pulse 88   Temp 97.9 F (36.6 C) (Oral)   Resp 16   SpO2 100%   Physical Exam Vitals and nursing note reviewed.  Constitutional:      General: He is not in acute distress.    Appearance: He is well-developed. He is obese.  HENT:     Head: Normocephalic and atraumatic.     Mouth/Throat:     Pharynx: No oropharyngeal exudate.  Eyes:     Conjunctiva/sclera: Conjunctivae normal.     Pupils: Pupils are equal, round, and reactive to light.  Neck:     Comments: No meningismus. Cardiovascular:     Rate and Rhythm: Normal rate and regular rhythm.     Heart sounds: Normal heart sounds. No murmur.  Pulmonary:     Effort: Pulmonary effort is normal. No respiratory distress.     Breath sounds: Normal breath sounds.  Abdominal:     Palpations: Abdomen is soft.     Tenderness: There is no abdominal tenderness. There is no guarding or rebound.  Genitourinary:    Comments: Foley in place, draining grossly bloody urine No testicular tenderness Musculoskeletal:        General: No  tenderness. Normal range of motion.     Cervical back: Normal range of motion and neck  supple.  Skin:    General: Skin is warm.  Neurological:     Mental Status: He is alert and oriented to person, place, and time.     Cranial Nerves: No cranial nerve deficit.     Motor: No abnormal muscle tone.     Coordination: Coordination normal.     Comments: No ataxia on finger to nose bilaterally. No pronator drift. 5/5 strength throughout. CN 2-12 intact.Equal grip strength. Sensation intact.   Psychiatric:        Behavior: Behavior normal.     ED Results / Procedures / Treatments   Labs (all labs ordered are listed, but only abnormal results are displayed) Labs Reviewed  CBC WITH DIFFERENTIAL/PLATELET - Abnormal; Notable for the following components:      Result Value   WBC 11.4 (*)    All other components within normal limits  BASIC METABOLIC PANEL - Abnormal; Notable for the following components:   CO2 20 (*)    Glucose, Bld 184 (*)    BUN 28 (*)    Creatinine, Ser 1.58 (*)    GFR calc non Af Amer 45 (*)    GFR calc Af Amer 52 (*)    All other components within normal limits  URINALYSIS, ROUTINE W REFLEX MICROSCOPIC - Abnormal; Notable for the following components:   Color, Urine RED (*)    APPearance CLOUDY (*)    Glucose, UA   (*)    Value: TEST NOT REPORTED DUE TO COLOR INTERFERENCE OF URINE PIGMENT   Hgb urine dipstick   (*)    Value: TEST NOT REPORTED DUE TO COLOR INTERFERENCE OF URINE PIGMENT   Bilirubin Urine   (*)    Value: TEST NOT REPORTED DUE TO COLOR INTERFERENCE OF URINE PIGMENT   Ketones, ur   (*)    Value: TEST NOT REPORTED DUE TO COLOR INTERFERENCE OF URINE PIGMENT   Protein, ur   (*)    Value: TEST NOT REPORTED DUE TO COLOR INTERFERENCE OF URINE PIGMENT   Nitrite   (*)    Value: TEST NOT REPORTED DUE TO COLOR INTERFERENCE OF URINE PIGMENT   Leukocytes,Ua   (*)    Value: TEST NOT REPORTED DUE TO COLOR INTERFERENCE OF URINE PIGMENT   RBC / HPF >50 (*)     Bacteria, UA MANY (*)    All other components within normal limits  URINE CULTURE    EKG None  Radiology No results found.  Procedures Procedures (including critical care time)  Medications Ordered in ED Medications - No data to display  ED Course  I have reviewed the triage vital signs and the nursing notes.  Pertinent labs & imaging results that were available during my care of the patient were reviewed by me and considered in my medical decision making (see chart for details).    MDM Rules/Calculators/A&P                      Patient status post TURP on January 29.  Presented with urinary retention.  Foley catheter placed by nursing staff.  Now with grossly bloody urine.  Does take Xarelto.  Will irrigate catheter.  Urine is clearing with irrigation to a pink color.  Discussed with his urologist Dr. Tresa Moore who knows patient well.  He states the patient can continue to irrigate his catheter at home and knows how to do so.  He is comfortable with patient going home with pink urine.  There are no  clots.  The catheter is draining normally.  Patient's pain is resolved.  Bloody UA. Culture sent. Creatinine improving.   Followup with Dr. Tresa Moore in office. Continue catheter irrigation at home. Return precautions discussed.     Final Clinical Impression(s) / ED Diagnoses Final diagnoses:  Urinary retention    Rx / DC Orders ED Discharge Orders    None       Rilyn Upshaw, Annie Main, MD 10/05/19 [phone]

## 2019-10-04 NOTE — ED Notes (Signed)
Pt provided supplies to irrigate foley at home. Pt able to demonstrate how to properly irrigate Foley. Pt educated to follow up with urology.

## 2019-10-04 NOTE — ED Triage Notes (Addendum)
Pt states that he has had trouble urinating.  Has had a stent put in that was removed today.  Has not been able urinate since it was removed 12 hours ago.

## 2019-10-04 NOTE — Anesthesia Postprocedure Evaluation (Signed)
Anesthesia Post Note  Patient: Tommy Yang  Procedure(s) Performed: TRANSURETHRAL RESECTION OF THE PROSTATE (TURP) (N/A Prostate)     Patient location during evaluation: PACU Anesthesia Type: General Level of consciousness: awake and alert Pain management: pain level controlled Vital Signs Assessment: post-procedure vital signs reviewed and stable Respiratory status: spontaneous breathing, nonlabored ventilation, respiratory function stable and patient connected to nasal cannula oxygen Cardiovascular status: blood pressure returned to baseline and stable Postop Assessment: no apparent nausea or vomiting Anesthetic complications: no    Last Vitals:  Vitals:   10/01/19 0615 10/01/19 0815  BP: 137/83 (!) 150/91  Pulse: 79 (!) 109  Resp:  18  Temp: (!) 36.1 C (!) 36.2 C  SpO2: 97% 98%    Last Pain:  Vitals:   10/01/19 0815  TempSrc:   PainSc: [place]

## 2019-10-04 NOTE — Discharge Instructions (Signed)
Irrigate your catheter as you have been instructed.  Follow-up with Dr. Tresa Moore.  Return to the ED with fever, vomiting, inability urinate or any other concerns.

## 2019-10-05 ENCOUNTER — Encounter (HOSPITAL_COMMUNITY): Payer: Self-pay | Admitting: Nurse Practitioner

## 2019-10-05 ENCOUNTER — Ambulatory Visit (HOSPITAL_BASED_OUTPATIENT_CLINIC_OR_DEPARTMENT_OTHER)
Admission: RE | Admit: 2019-10-05 | Discharge: 2019-10-05 | Disposition: A | Discharge status: Home / Self Care | Payer: BC Managed Care – PPO | Source: Ambulatory Visit | Attending: Nurse Practitioner | Admitting: Nurse Practitioner

## 2019-10-05 VITALS — BP 132/84 | HR 132 | Ht 71.0 in | Wt 305.0 lb

## 2019-10-05 DIAGNOSIS — R339 Retention of urine, unspecified: Secondary | ICD-10-CM | POA: Diagnosis not present

## 2019-10-05 DIAGNOSIS — D6869 Other thrombophilia: Secondary | ICD-10-CM

## 2019-10-05 DIAGNOSIS — I4819 Other persistent atrial fibrillation: Secondary | ICD-10-CM | POA: Diagnosis not present

## 2019-10-05 DIAGNOSIS — I10 Essential (primary) hypertension: Secondary | ICD-10-CM | POA: Diagnosis not present

## 2019-10-05 DIAGNOSIS — Z7901 Long term (current) use of anticoagulants: Secondary | ICD-10-CM | POA: Diagnosis not present

## 2019-10-05 DIAGNOSIS — E119 Type 2 diabetes mellitus without complications: Secondary | ICD-10-CM | POA: Diagnosis not present

## 2019-10-05 LAB — URINE CULTURE: Culture: <10000 — AB

## 2019-10-05 MED ORDER — FUROSEMIDE 20 MG PO TABS: ORAL_TABLET | ORAL | 0 refills | Status: DC

## 2019-10-05 MED ORDER — DILTIAZEM HCL ER COATED BEADS 120 MG PO CP24: 120.0000 mg | ORAL_CAPSULE | Freq: Every day | ORAL | 3 refills | Status: DC

## 2019-10-05 NOTE — Patient Instructions (Signed)
Stop amlodipine  Start Cardizem 120mg  once a day  Lasix 30mg  once a day for 3 days then if swelling not improved can do 20mg  a day over weekend.

## 2019-10-05 NOTE — Progress Notes (Signed)
    Date:  10/05/2019   ID:  Tommy Yang, DOB 04/27/1951, MRN [id number]  Location: home   Provider location: [address] [place], [place] [address] Evaluation Performed:Follow up   PCP:  [name], James W, MD  Primary Cardiologist:  None  Primary Electrophysiologist: none   CC:Atrial fibrillation   History of Present Illness: Tommy Yang is a 69 y.o. male who presents for f/u today. He has longstanding persistent afib. He feels he may have had afib for 2 years befor he was diagnosed.  On previous visits, he has been offered AAD therapy which he has deferred as he feels he is minimally symptomatic. He does have some snoring. Possible apnea, but  pt states he would not be able to deal with a mask to treat sleep apnea. He scratched  his lower leg and being diabetic, has an appointment with the wound center to make sure it is healing.  6 month f/u in afib clinic, 09/03/19. He is in rate controlled afib. He reports going to ER  In September after the flu shot with afib with RVR. He was rate controlled with IV Cardizem and discharged home on same meds. He is having issues now with urinary retention with h/o prostate CA. He is wearing a urinary catheter. He is due to go back to urologist tomorrow for removal but if he can not urinate , he was told he will have to have surgery. He is having intermittent hematuria now. His prior leg wound did heal. Continues on xarelto with a CHA2DS2VASc score of 5.he is on tumeric and fish oil which may be contributing to hematuria.  In afib clinic, 10/05/19. He had post chanel TURP on 09/30/19 and had to go back to ER yesterday  to have foley reinserted after not being able to void since it was removed earlier that am. Urine had clots in the ER but was irrigated to pink color and remains that way today. He is back on xarelto. Pt asked to be seen today after increase in HR into the 120-130's and has noted swelling in his lower extremities. He does not describe   any PND/orthopnea. He feels nervous with the increase of HR and short of breath with exertion.. HIs overall weight is down one lb today but his LE's are swollen.  Today, he denies symptoms of palpitations, chest pain, shortness of breath, orthopnea, PND, lower extremity edema, claudication, dizziness, presyncope, syncope, bleeding, or neurologic sequela. The patient is tolerating medications without difficulties and is otherwise without complaint today.   he denies symptoms of cough, fevers, chills, or new SOB worrisome for COVID 19.    Filed Weights   10/05/19 1425  Weight: (!) 138.3 kg    Past Medical History:  Diagnosis Date  . Arthritis   . Atrial fibrillation (HCC)   . Diabetes mellitus without complication (HCC)    type 2  . DVT (deep venous thrombosis) (HCC) 2015   left leg.  . Foley catheter in place last changed 2eeks ago   last 7 months  . GERD (gastroesophageal reflux disease)   . Hx of radiation therapy   . Hypercholesterolemia   . Hypertension   . Prostate cancer (HCC) 12/06/13   gleason 4+3=7, 6/12 cores positive. seed implant and radiation  . Sleep apnea    no cpap used  . Urinary retention    Past Surgical History:  Procedure Laterality Date  . APPENDECTOMY    . BALLOON DILATION N/A 08/02/2015     Procedure: BALLOON DILATION;  Surgeon: [name], MD;  Location: [facility];  Service: Endoscopy;  Laterality: N/A;  . CARDIOVERSION N/A 05/13/2018   Procedure: CARDIOVERSION;  Surgeon: Sanda Klein, MD;  Location: [facility];  Service: Cardiovascular;  Laterality: N/A;  . CARDIOVERSION N/A 05/18/2018   Procedure: CARDIOVERSION;  Surgeon: Buford Dresser, MD;  Location: [facility];  Service: Cardiovascular;  Laterality: N/A;  . COLONOSCOPY WITH PROPOFOL N/A 08/02/2015   Procedure: COLONOSCOPY WITH PROPOFOL Yang/ APC;  Surgeon: [name], MD;  Location: [facility];  Service: Endoscopy;  Laterality: N/A;  . ESOPHAGOGASTRODUODENOSCOPY (EGD) WITH  PROPOFOL N/A 08/02/2015   Procedure: ESOPHAGOGASTRODUODENOSCOPY (EGD) WITH PROPOFOL/ possible dilation.;  Surgeon: [name], MD;  Location: [facility];  Service: Endoscopy;  Laterality: N/A;  . KNEE SURGERY     right knee orthoscopic  . PROSTATE BIOPSY  12/06/13   Gleason 4+3=7, volume 35 gm  . [place]   Tourette'ssyndrome spinal fluid removed   . TONSILLECTOMY  as child  . TRANSURETHRAL RESECTION OF PROSTATE N/A 09/30/2019   Procedure: TRANSURETHRAL RESECTION OF THE PROSTATE (TURP);  Surgeon: Alexis Frock, MD;  Location: [facility];  Service: Urology;  Laterality: N/A;  1 HR     Current Outpatient Medications  Medication Sig Dispense Refill  . atorvastatin (LIPITOR) 40 MG tablet TAKE 1 TABLET BY MOUTH EVERY DAY 90 tablet 1  . blood glucose meter kit and supplies KIT Test blood sugar daily as directed. Dx code: E11.9 1 each 0  . cephALEXin (KEFLEX) 500 MG capsule Take 1 capsule (500 mg total) by mouth 2 (two) times daily. X 5 days to prevent post-op infection 10 capsule 0  . CINNAMON PO Take 1,000 mg by mouth daily.    [name] Coral Calcium 1000 (390 Ca) MG TABS Take 1,000 mg by mouth daily.    [name] glucose blood (ACCU-CHEK GUIDE) test strip Use as instructed to check blood sugar twice daily. 100 each 3  . INVOKAMET XR [id number] MG TB24 daily. Taking 2 tablets in the am    . leuprolide (LUPRON) 11.25 MG injection Inject 11.25 mg into the muscle every 6 (six) months. Took oct 2020    . losartan (COZAAR) 50 MG tablet Take 1 tablet (50 mg total) by mouth daily. 90 tablet 1  . metoprolol tartrate (LOPRESSOR) 100 MG tablet TAKE 1 TABLET BY MOUTH TWICE A DAY 180 tablet 2  . Multiple Vitamins-Minerals (ZINC PO) Take 1 tablet by mouth daily.    [name] OZEMPIC, 1 MG/DOSE, 2 MG/1.5ML SOPN INJECT 1MG UNDER THE SKIN ONCE WEEKLY. (Patient taking differently: Inject 1 mg into the skin once a week. fridays) 9 pen 1  . pantoprazole (PROTONIX) 40 MG tablet Take 1 tablet (40  mg total) by mouth daily. 90 tablet 3  . rivaroxaban (XARELTO) 20 MG TABS tablet Take 20 mg by mouth daily before breakfast.    . tamsulosin (FLOMAX) 0.4 MG CAPS capsule Take 1 capsule (0.4 mg total) by mouth daily. (Patient taking differently: Take 0.4 mg by mouth 2 (two) times daily. ) 30 capsule 30  . Turmeric 450 MG CAPS Take 450 mg by mouth daily after breakfast.     . Vitamin D, Cholecalciferol, 400 UNITS TABS Take 400 Units by mouth daily.    [name] diltiazem (CARDIZEM CD) 120 MG 24 hr capsule Take 1 capsule (120 mg total) by mouth daily. 30 capsule 3  . furosemide (LASIX) 20 MG tablet Take 87m by mouth  daily for 3 days then 1 tablet only as needed for swelling 10 tablet 0   No current facility-administered medications for this encounter.    Allergies:   Patient has no known allergies.   Social History:  The patient  reports that he quit smoking about 7 years ago. His smoking use included cigarettes. He has a 11.50 pack-year smoking history. He has never used smokeless tobacco. He reports current alcohol use. He reports that he does not use drugs.   Family History:  The patient's  family history includes COPD in his brother; Cancer in his brother; Diabetes in his brother, father, and mother; Heart disease in his father and mother; Hyperlipidemia in his brother, father, and mother; Hypertension in his brother, father, and mother; Varicose Veins in his mother.    ROS:  Please see the history of present illness.   All other systems are personally reviewed and negative.   Exam:General:  No distress Eyes: EOMI, anicteric ENT: Oral Mucosa clear and moist Cardiovascular: Normal rate, rapid  irregularly irregular rhythm, no murmurs, rubs or gallops, no edema, Respiratory: Normal respiratory effort on room air, lungs clear to auscultation bilaterally Abdomen: soft, non-distended, non-tender, normal bowel sounds. Wearing a urinary catheter with blood tinged urine. Skin: No Rash Neurologic:  Grossly no focal neuro deficit.Mental status AAOx3, speech normal, Psychiatric:Appropriate affect, and mood   Recent Labs: 05/23/2019: Magnesium 2.1; TSH 0.770 08/11/2019: ALT 15 10/04/2019: BUN 28; Creatinine, Ser 1.58; Hemoglobin 13.0; Platelets 255; Potassium 4.2; Sodium 138  personally reviewed    Other studies personally reviewed: Epic records reviewed     ASSESSMENT AND PLAN:  1.  Longstanding Persistent  atrial fibrillation Since  surgery v rates are elevated in the 120's/130's Will stop amlodipine and add cardizem 120 mg daily for rate control  Continue metoprolol tartrate 100 mg bid   Pt  In past  deferred  AAD therapy Continue xarelto 20 mg daily  This patients CHA2DS2-VASc Score and unadjusted Ischemic Stroke Rate (% per year) is equal to 7.2 % stroke rate/year from a score of 5  2. LEE/shortness of breath with exertion  Weight stable but appears to be holding extra fluid  Will start 20 mg lasix 1 1/2 tabs daily x 3 days, can drop back to 20 mg on Sunday and Monday if  he feels the 3 days of diuretics do not resolve his symptoms  Bmet in ER  showed Kt of 4.4 with a cr of 1.58 ms CBC reviewed from ER,  with elevated white count and normal H/H/PLTS  Follow-up:  I will see back on Monday with a bmet   Current medicines are reviewed at length with the patient today.   The patient does not have concerns regarding his medicines.  The following changes were made today:  none  Labs/ tests ordered today include:none Orders Placed This Encounter  Procedures  . EKG 12-Lead    Patient Risk:  after full review of this patients clinical status, I feel that they are at moderate risk at this time.  Signed, Roderic Palau, NP 10/05/2019 2:52 PM  [facility] [address] [place], [place] [address] [phone]

## 2019-10-08 ENCOUNTER — Other Ambulatory Visit: Payer: Self-pay

## 2019-10-08 ENCOUNTER — Emergency Department (HOSPITAL_COMMUNITY)
Admission: EM | Admit: 2019-10-08 | Discharge: 2019-10-08 | Disposition: A | Discharge status: Home / Self Care | Payer: BC Managed Care – PPO | Attending: Emergency Medicine | Admitting: Emergency Medicine

## 2019-10-08 DIAGNOSIS — Z7984 Long term (current) use of oral hypoglycemic drugs: Secondary | ICD-10-CM | POA: Diagnosis not present

## 2019-10-08 DIAGNOSIS — E119 Type 2 diabetes mellitus without complications: Secondary | ICD-10-CM | POA: Diagnosis not present

## 2019-10-08 DIAGNOSIS — Z79899 Other long term (current) drug therapy: Secondary | ICD-10-CM | POA: Diagnosis not present

## 2019-10-08 DIAGNOSIS — R319 Hematuria, unspecified: Secondary | ICD-10-CM | POA: Insufficient documentation

## 2019-10-08 DIAGNOSIS — C61 Malignant neoplasm of prostate: Secondary | ICD-10-CM | POA: Insufficient documentation

## 2019-10-08 DIAGNOSIS — Z9889 Other specified postprocedural states: Secondary | ICD-10-CM | POA: Insufficient documentation

## 2019-10-08 DIAGNOSIS — Z87891 Personal history of nicotine dependence: Secondary | ICD-10-CM | POA: Insufficient documentation

## 2019-10-08 DIAGNOSIS — Z7901 Long term (current) use of anticoagulants: Secondary | ICD-10-CM | POA: Insufficient documentation

## 2019-10-08 DIAGNOSIS — I10 Essential (primary) hypertension: Secondary | ICD-10-CM | POA: Diagnosis not present

## 2019-10-08 LAB — CBC WITH DIFFERENTIAL/PLATELET
Abs Immature Granulocytes: 0.04 10*3/uL (ref 0.00–0.07)
Basophils Absolute: 0.1 10*3/uL (ref 0.0–0.1)
Basophils Relative: 1 %
Eosinophils Absolute: 0.1 10*3/uL (ref 0.0–0.5)
Eosinophils Relative: 1 %
HCT: 37.7 % — ABNORMAL LOW (ref 39.0–52.0)
Hemoglobin: 12.2 g/dL — ABNORMAL LOW (ref 13.0–17.0)
Immature Granulocytes: 0 %
Lymphocytes Relative: 21 %
Lymphs Abs: 2.2 10*3/uL (ref 0.7–4.0)
MCH: 27 pg (ref 26.0–34.0)
MCHC: 32.4 g/dL (ref 30.0–36.0)
MCV: 83.4 fL (ref 80.0–100.0)
Monocytes Absolute: 1 10*3/uL (ref 0.1–1.0)
Monocytes Relative: 10 %
Neutro Abs: 7.1 10*3/uL (ref 1.7–7.7)
Neutrophils Relative %: 67 %
Platelets: 289 10*3/uL (ref 150–400)
RBC: 4.52 MIL/uL (ref 4.22–5.81)
RDW: 14.6 % (ref 11.5–15.5)
WBC: 10.6 10*3/uL — ABNORMAL HIGH (ref 4.0–10.5)
nRBC: 0 % (ref 0.0–0.2)

## 2019-10-08 LAB — URINALYSIS, ROUTINE W REFLEX MICROSCOPIC
Bacteria, UA: NONE SEEN
Bilirubin Urine: NEGATIVE
Glucose, UA: >=500 mg/dL — AB
Ketones, ur: NEGATIVE mg/dL
Nitrite: NEGATIVE
Protein, ur: 100 mg/dL — AB
RBC / HPF: >50 RBC/hpf — ABNORMAL HIGH (ref 0–5)
Specific Gravity, Urine: 1.01 (ref 1.005–1.030)
WBC, UA: >50 WBC/hpf — ABNORMAL HIGH (ref 0–5)
pH: 6 (ref 5.0–8.0)

## 2019-10-08 LAB — BASIC METABOLIC PANEL
Anion gap: 10 (ref 5–15)
BUN: 26 mg/dL — ABNORMAL HIGH (ref 8–23)
CO2: 23 mmol/L (ref 22–32)
Calcium: 8.9 mg/dL (ref 8.9–10.3)
Chloride: 106 mmol/L (ref 98–111)
Creatinine, Ser: 1.32 mg/dL — ABNORMAL HIGH (ref 0.61–1.24)
GFR calc Af Amer: >60 mL/min (ref >60)
GFR calc non Af Amer: 55 mL/min — ABNORMAL LOW (ref >60)
Glucose, Bld: 182 mg/dL — ABNORMAL HIGH (ref 70–99)
Potassium: 4.3 mmol/L (ref 3.5–5.1)
Sodium: 139 mmol/L (ref 135–145)

## 2019-10-08 NOTE — ED Notes (Signed)
Urinated pink urine after irrigation 340 cc urine.

## 2019-10-08 NOTE — ED Notes (Signed)
Irrigated foley, pink return without clots at present time, emptied foley bag and will monitor amount of flow from bladder for a while.

## 2019-10-08 NOTE — ED Notes (Signed)
Foley continues to drain freely with pink urine and no noted clots.

## 2019-10-08 NOTE — Discharge Instructions (Addendum)
Please return for any problem.  Follow-up with urologist Dr. Tammi Klippel as previously scheduled for this Tuesday.

## 2019-10-08 NOTE — ED Triage Notes (Signed)
Patient states last Friday he had surgery to open up urethra so he can void better. Patient has indwelling catheter and states he has blood clots in his catheter.  Patient says he is also "leaking fluid from urethra". Patient says fluid is white and dries it is sticky. Patient says he has irrigated his catheter four times since 0400 today and is still not getting proper urine flow.

## 2019-10-08 NOTE — ED Provider Notes (Signed)
[facility] DEPT Provider Note   CSN: [id number] Arrival date & time: 10/08/19  0932     History No chief complaint on file.   Tommy Yang is a 69 y.o. male.  69 year old male with prior medical history as detailed below presents for evaluation of hematuria.  Patient reports recent TURP with Dr. Tresa Moore.  Patient reports that his catheter post the procedure was initially removed but he required replacement of Foley 5 days prior.  Patient reports that over the course of the last 24 hours he noted slightly decreased output into his catheter bag and increased clots.  Patient was able to flush at home with minimal improvement.  Patient is currently comfortable.  He denies abdominal pain or discomfort.  Denies fever.  He denies significant blood loss.  He describes seeing some clotting earlier this morning but nothing since.  He reports pinkish tinged urine in his catheter bag.  The history is provided by the patient and medical records.  Illness Location:  Hematuria post TURP Severity:  Mild Onset quality:  Gradual Duration:  1 day Timing:  Constant Progression:  Partially resolved Chronicity:  New Associated symptoms: no fever        Past Medical History:  Diagnosis Date  . Arthritis   . Atrial fibrillation ([place])   . Diabetes mellitus without complication ([place])    type 2  . DVT (deep venous thrombosis) ([facility]) 2015   left leg.  . Foley catheter in place last changed 2eeks ago   last 7 months  . GERD (gastroesophageal reflux disease)   . Hx of radiation therapy   . Hypercholesterolemia   . Hypertension   . Prostate cancer ([place]) 12/06/13   gleason 4+3=7, 6/12 cores positive. seed implant and radiation  . Sleep apnea    no cpap used  . Urinary retention     Patient Active Problem List   Diagnosis Date Noted  . Urinary retention 09/30/2019  . Bilateral hydronephrosis 08/12/2019  . Obstructive uropathy 08/11/2019  . Acute lower UTI  08/11/2019  . AKI (acute kidney injury) ([place]) 08/11/2019  . Acute urinary retention 08/11/2019  . Dysuria 08/10/2019  . Constipation 08/10/2019  . Low back pain 08/10/2019  . Paroxysmal atrial fibrillation with RVR ([place]) 05/23/2019  . Paroxysmal atrial fibrillation ([place]) 05/23/2019  . Encounter for well adult exam with abnormal findings 02/24/2019  . Leg wound, right 02/24/2019  . Essential hypertension 09/24/2018  . Persistent atrial fibrillation   . Microalbuminuria 04/23/2016  . Atrial fibrillation ([place]) 12/29/2015  . Rectal bleeding 07/18/2015  . Dysphagia 07/18/2015  . Chronic anticoagulation 03/14/2015  . Morbid obesity ([place]) 03/14/2015  . Exertional shortness of breath 01/12/2015  . Abnormal ECG 01/12/2015  . Encounter for screening colonoscopy 05/09/2014  . Esophageal reflux 05/09/2014  . Other dysphagia 05/09/2014  . Evaluate for OSA (obstructive sleep apnea) 02/28/2014  . Hyperlipidemia 02/28/2014  . Prostate cancer ([place]) 12/14/2013  . Erectile dysfunction 10/26/2013  . Type II diabetes mellitus, uncontrolled ([place]) 10/19/2013  . DVT (deep venous thrombosis) ([place])   . Left leg DVT ([name]) 09/28/2013  . Leg edema, left 09/28/2013    Past Surgical History:  Procedure Laterality Date  . APPENDECTOMY    . BALLOON DILATION N/A 08/02/2015   Procedure: BALLOON DILATION;  Surgeon: [name], MD;  Location: [facility];  Service: Endoscopy;  Laterality: N/A;  . CARDIOVERSION N/A 05/13/2018   Procedure: CARDIOVERSION;  Surgeon: Sanda Klein, MD;  Location: [facility];  Service:  Cardiovascular;  Laterality: N/A;  . CARDIOVERSION N/A 05/18/2018   Procedure: CARDIOVERSION;  Surgeon: Buford Dresser, MD;  Location: [facility];  Service: Cardiovascular;  Laterality: N/A;  . COLONOSCOPY WITH PROPOFOL N/A 08/02/2015   Procedure: COLONOSCOPY WITH PROPOFOL w/ APC;  Surgeon: [name], MD;  Location: [facility];  Service: Endoscopy;  Laterality: N/A;  .  ESOPHAGOGASTRODUODENOSCOPY (EGD) WITH PROPOFOL N/A 08/02/2015   Procedure: ESOPHAGOGASTRODUODENOSCOPY (EGD) WITH PROPOFOL/ possible dilation.;  Surgeon: [name], MD;  Location: [facility];  Service: Endoscopy;  Laterality: N/A;  . KNEE SURGERY     right knee orthoscopic  . PROSTATE BIOPSY  12/06/13   Gleason 4+3=7, volume 35 gm  . [place]   Tourette'ssyndrome spinal fluid removed   . TONSILLECTOMY  as child  . TRANSURETHRAL RESECTION OF PROSTATE N/A 09/30/2019   Procedure: TRANSURETHRAL RESECTION OF THE PROSTATE (TURP);  Surgeon: Alexis Frock, MD;  Location: [facility];  Service: Urology;  Laterality: N/A;  1 HR       Family History  Problem Relation Age of Onset  . Heart disease Mother        before age 89  . Diabetes Mother   . Hyperlipidemia Mother   . Varicose Veins Mother   . Hypertension Mother   . Heart disease Father   . Diabetes Father   . Hyperlipidemia Father   . Hypertension Father   . Cancer Brother   . COPD Brother   . Diabetes Brother   . Hyperlipidemia Brother   . Hypertension Brother     Social History   Tobacco Use  . Smoking status: Former Smoker    Packs/day: 0.50    Years: 23.00    Pack years: 11.50    Types: Cigarettes    Quit date: 08/01/2012    Years since quitting: 7.1  . Smokeless tobacco: Never Used  Substance Use Topics  . Alcohol use: Yes    Alcohol/week: 0.0 standard drinks    Comment: very seldom, maybe 6x per year  . Drug use: Never    Home Medications Prior to Admission medications   Medication Sig Start Date End Date Taking? Authorizing Provider  atorvastatin (LIPITOR) 40 MG tablet TAKE 1 TABLET BY MOUTH EVERY DAY Patient taking differently: Take 40 mg by mouth daily.  09/22/19  Yes [name], MD  blood glucose meter kit and supplies KIT Test blood sugar daily as directed. Dx code: E11.9 01/23/15  Yes Darlyne Russian, MD  CALCIUM PO Take 1 tablet by mouth daily.   Yes [provider]  CINNAMON PO Take 1 capsule by mouth daily.    Yes [provider]  diltiazem (CARDIZEM CD) 120 MG 24 hr capsule Take 1 capsule (120 mg total) by mouth daily. 10/05/19  Yes Sherran Needs, NP  furosemide (LASIX) 20 MG tablet Take 31m by mouth daily for 3 days then 1 tablet only as needed for swelling 10/05/19  Yes CSherran Needs NP  glucose blood (ACCU-CHEK GUIDE) test strip Use as instructed to check blood sugar twice daily. 10/29/18  Yes KElayne Snare MD  INVOKAMET XR [id number] MG TB24 Take 2 tablets by mouth daily.  08/15/19  Yes [provider]  leuprolide (LUPRON) 11.25 MG injection Inject 11.25 mg into the muscle every 6 (six) months.    Yes [provider]  losartan (COZAAR) 50 MG tablet Take 1 tablet (50 mg total) by mouth daily. 05/16/19  Yes [name],  Hunt Oris, MD  metoprolol tartrate (LOPRESSOR) 100 MG tablet TAKE 1 TABLET BY MOUTH TWICE A DAY Patient taking differently: Take 100 mg by mouth 2 (two) times daily.  08/03/19  Yes Sherran Needs, NP  OZEMPIC, 1 MG/DOSE, 2 MG/1.5ML SOPN INJECT 1MG UNDER THE SKIN ONCE WEEKLY. Patient taking differently: Inject 1 mg into the skin once a week. fridays 06/28/19  Yes Elayne Snare, MD  pantoprazole (PROTONIX) 40 MG tablet Take 1 tablet (40 mg total) by mouth daily. 02/24/19  Yes [name], MD  rivaroxaban (XARELTO) 20 MG TABS tablet Take 20 mg by mouth daily before breakfast.   Yes [provider]  tamsulosin (FLOMAX) 0.4 MG CAPS capsule Take 1 capsule (0.4 mg total) by mouth daily. Patient taking differently: Take 0.4 mg by mouth 2 (two) times daily.  08/14/19  Yes Shawna Clamp, MD  Vitamin D, Cholecalciferol, 400 UNITS TABS Take 400 Units by mouth daily.   Yes [provider]  cephALEXin (KEFLEX) 500 MG capsule Take 1 capsule (500 mg total) by mouth 2 (two) times daily. X 5 days to prevent post-op infection Patient not taking: Reported on 10/08/2019 09/30/19   Alexis Frock, MD     Allergies    Patient has no known allergies.  Review of Systems   Review of Systems  Constitutional: Negative for fever.  All other systems reviewed and are negative.   Physical Exam Updated Vital Signs BP (!) 151/106 (BP Location: Left Arm)   Pulse 95   Temp 98.7 F (37.1 C) (Oral)   Ht 5' 11" (1.803 m)   Wt (!) 138.3 kg   SpO2 97%   BMI 42.54 kg/m   Physical Exam Vitals and nursing note reviewed.  Constitutional:      General: He is not in acute distress.    Appearance: Normal appearance. He is well-developed.  HENT:     Head: Normocephalic and atraumatic.  Eyes:     Conjunctiva/sclera: Conjunctivae normal.     Pupils: Pupils are equal, round, and reactive to light.  Cardiovascular:     Rate and Rhythm: Normal rate and regular rhythm.     Heart sounds: Normal heart sounds.  Pulmonary:     Effort: Pulmonary effort is normal. No respiratory distress.     Breath sounds: Normal breath sounds.  Abdominal:     General: There is no distension.     Palpations: Abdomen is soft.     Tenderness: There is no abdominal tenderness.     Comments: No suprapubic tenderness  Genitourinary:    Comments: Foley catheter in place  Foley catheter flushed easily.  Pink-tinged urine obtained.  No clot in urine noted. No frank hematuria. Musculoskeletal:        General: No deformity. Normal range of motion.     Cervical back: Normal range of motion and neck supple.  Skin:    General: Skin is warm and dry.  Neurological:     Mental Status: He is alert and oriented to person, place, and time.     ED Results / Procedures / Treatments   Labs (all labs ordered are listed, but only abnormal results are displayed) Labs Reviewed  URINALYSIS, ROUTINE W REFLEX MICROSCOPIC - Abnormal; Notable for the following components:      Result Value   Color, Urine RED (*)    APPearance HAZY (*)    Glucose, UA >=500 (*)    Hgb urine dipstick LARGE (*)    Protein, ur 100 (*)  Leukocytes,Ua SMALL (*)    RBC / HPF >50 (*)    WBC, UA >50 (*)    Crystals PRESENT (*)    All other components within normal limits  BASIC METABOLIC PANEL - Abnormal; Notable for the following components:   Glucose, Bld 182 (*)    BUN 26 (*)    Creatinine, Ser 1.32 (*)    GFR calc non Af Amer 55 (*)    All other components within normal limits  CBC WITH DIFFERENTIAL/PLATELET - Abnormal; Notable for the following components:   WBC 10.6 (*)    Hemoglobin 12.2 (*)    HCT 37.7 (*)    All other components within normal limits  URINE CULTURE    EKG None  Radiology No results found.  Procedures Procedures (including critical care time)  Medications Ordered in ED Medications - No data to display  ED Course  I have reviewed the triage vital signs and the nursing notes.  Pertinent labs & imaging results that were available during my care of the patient were reviewed by me and considered in my medical decision making (see chart for details).    MDM Rules/Calculators/A&P                      MDM  Screen complete  Tommy Yang was evaluated in Emergency Department on 10/08/2019 for the symptoms described in the history of present illness. He was evaluated in the context of the global COVID-19 pandemic, which necessitated consideration that the patient might be at risk for infection with the SARS-CoV-2 virus that causes COVID-19. Institutional protocols and algorithms that pertain to the evaluation of patients at risk for COVID-19 are in a state of rapid change based on information released by regulatory bodies including the CDC and federal and state organizations. These policies and algorithms were followed during the patient's care in the ED.   Patient is presenting for evaluation of hematuria related to recent TURP.  Patient's Foley catheter was irrigated and pink-tinged urine was obtained.  Patient without evidence of urinary obstruction.  No frank blood noted.    Screening labs are without significant reality.  Patient has established appointment with Dr. Tresa Moore of urology on Tuesday of this week.  He understands need for close follow-up.  He is comfortable with self irrigation at home.  Strict return precautions given and understood.   Final Clinical Impression(s) / ED Diagnoses Final diagnoses:  Hematuria, unspecified type    Rx / DC Orders ED Discharge Orders    None       Valarie Merino, MD 10/08/19 1241

## 2019-10-10 ENCOUNTER — Ambulatory Visit (HOSPITAL_COMMUNITY)
Admission: RE | Admit: 2019-10-10 | Discharge: 2019-10-10 | Disposition: A | Discharge status: Home / Self Care | Payer: BC Managed Care – PPO | Source: Ambulatory Visit | Attending: Nurse Practitioner | Admitting: Nurse Practitioner

## 2019-10-10 ENCOUNTER — Encounter (HOSPITAL_COMMUNITY): Payer: Self-pay | Admitting: Nurse Practitioner

## 2019-10-10 ENCOUNTER — Other Ambulatory Visit: Payer: Self-pay

## 2019-10-10 VITALS — BP 112/62 | HR 90 | Ht 71.0 in | Wt 302.2 lb

## 2019-10-10 DIAGNOSIS — Z79899 Other long term (current) drug therapy: Secondary | ICD-10-CM | POA: Insufficient documentation

## 2019-10-10 DIAGNOSIS — D6869 Other thrombophilia: Secondary | ICD-10-CM

## 2019-10-10 DIAGNOSIS — Z8546 Personal history of malignant neoplasm of prostate: Secondary | ICD-10-CM | POA: Insufficient documentation

## 2019-10-10 DIAGNOSIS — E119 Type 2 diabetes mellitus without complications: Secondary | ICD-10-CM | POA: Insufficient documentation

## 2019-10-10 DIAGNOSIS — Z87891 Personal history of nicotine dependence: Secondary | ICD-10-CM | POA: Diagnosis not present

## 2019-10-10 DIAGNOSIS — I4819 Other persistent atrial fibrillation: Secondary | ICD-10-CM | POA: Diagnosis not present

## 2019-10-10 DIAGNOSIS — M1990 Unspecified osteoarthritis, unspecified site: Secondary | ICD-10-CM | POA: Insufficient documentation

## 2019-10-10 DIAGNOSIS — G4730 Sleep apnea, unspecified: Secondary | ICD-10-CM | POA: Insufficient documentation

## 2019-10-10 DIAGNOSIS — E7800 Pure hypercholesterolemia, unspecified: Secondary | ICD-10-CM | POA: Insufficient documentation

## 2019-10-10 DIAGNOSIS — Z86718 Personal history of other venous thrombosis and embolism: Secondary | ICD-10-CM | POA: Diagnosis not present

## 2019-10-10 DIAGNOSIS — Z7901 Long term (current) use of anticoagulants: Secondary | ICD-10-CM | POA: Diagnosis not present

## 2019-10-10 DIAGNOSIS — I10 Essential (primary) hypertension: Secondary | ICD-10-CM | POA: Diagnosis not present

## 2019-10-10 DIAGNOSIS — K219 Gastro-esophageal reflux disease without esophagitis: Secondary | ICD-10-CM | POA: Diagnosis not present

## 2019-10-10 DIAGNOSIS — I4811 Longstanding persistent atrial fibrillation: Secondary | ICD-10-CM | POA: Insufficient documentation

## 2019-10-10 DIAGNOSIS — Z923 Personal history of irradiation: Secondary | ICD-10-CM | POA: Insufficient documentation

## 2019-10-10 LAB — BASIC METABOLIC PANEL
Anion gap: 12 (ref 5–15)
BUN: 37 mg/dL — ABNORMAL HIGH (ref 8–23)
CO2: 20 mmol/L — ABNORMAL LOW (ref 22–32)
Calcium: 9.2 mg/dL (ref 8.9–10.3)
Chloride: 104 mmol/L (ref 98–111)
Creatinine, Ser: 1.83 mg/dL — ABNORMAL HIGH (ref 0.61–1.24)
GFR calc Af Amer: 43 mL/min — ABNORMAL LOW (ref >60)
GFR calc non Af Amer: 37 mL/min — ABNORMAL LOW (ref >60)
Glucose, Bld: 153 mg/dL — ABNORMAL HIGH (ref 70–99)
Potassium: 4.6 mmol/L (ref 3.5–5.1)
Sodium: 136 mmol/L (ref 135–145)

## 2019-10-10 MED ORDER — FUROSEMIDE 20 MG PO TABS: 20.0000 mg | ORAL_TABLET | Freq: Every day | ORAL | 1 refills | Status: DC | PRN

## 2019-10-10 NOTE — Progress Notes (Signed)
Date:  10/10/2019   ID:  Tommy Yang, DOB 1951-03-23, MRN [id number]  Location: home   Provider location: [address] [place], [place] [address] Evaluation Performed:Follow up   PCP:  [name], MD  Primary Cardiologist:  None  Primary Electrophysiologist: none   XJ:OITGPQ fibrillation   History of Present Illness: Tommy Yang is a 69 y.o. male who presents for f/u today. He has longstanding persistent afib. He feels he may have had afib for 2 years befor he was diagnosed.  On previous visits, he has been offered AAD therapy which he has deferred as he feels he is minimally symptomatic. He does have some snoring. Possible apnea, but  pt states he would not be able to deal with a mask to treat sleep apnea. He scratched  his lower leg and being diabetic, has an appointment with the wound center to make sure it is healing.  6 month f/u in afib clinic, 09/03/19. He is in rate controlled afib. He reports going to ER  In September after the flu shot with afib with RVR. He was rate controlled with IV Cardizem and discharged home on same meds. He is having issues now with urinary retention with h/o prostate CA. He is wearing a urinary catheter. He is due to go back to urologist tomorrow for removal but if he can not urinate , he was told he will have to have surgery. He is having intermittent hematuria now. His prior leg wound did heal. Continues on xarelto with a CHA2DS2VASc score of 5.he is on tumeric and fish oil which may be contributing to hematuria.  In afib clinic, 10/05/19. He had post chanel TURP on 09/30/19 and had to go back to ER yesterday  to have foley reinserted after not being able to void since it was removed earlier that am. Urine had clots in the ER but was irrigated to pink color and remains that way today. He is back on xarelto. Pt asked to be seen today after increase in HR into the 120-130's and has noted swelling in his lower extremities. He does not describe   any PND/orthopnea. He feels nervous with the increase of HR and short of breath with exertion.[name] overall weight is down one lb today but his LE's are swollen.  F/u in afib clinic, 10/10/19. He feels better with lasix and addition of Cardizem to slow HR. He took lasix x 5 days and now his weight is back down to his normal. He will stay on Cardizem. Bmet drawn today.He continues with foley cath with pinkish urine and sees urologist tomorrow.   Today, he denies symptoms of palpitations, chest pain, shortness of breath, orthopnea, PND, lower extremity edema, claudication, dizziness, presyncope, syncope, bleeding, or neurologic sequela. The patient is tolerating medications without difficulties and is otherwise without complaint today.   he denies symptoms of cough, fevers, chills, or new SOB worrisome for COVID 19.    Filed Weights   10/10/19 0853  Weight: (!) 137.1 kg    Past Medical History:  Diagnosis Date  . Arthritis   . Atrial fibrillation ([place])   . Diabetes mellitus without complication ([place])    type 2  . DVT (deep venous thrombosis) ([place]) 2015   left leg.  . Foley catheter in place last changed 2eeks ago   last 7 months  . GERD (gastroesophageal reflux disease)   . Hx of radiation therapy   . Hypercholesterolemia   . Hypertension   .  Prostate cancer ([place]) 12/06/13   gleason 4+3=7, 6/12 cores positive. seed implant and radiation  . Sleep apnea    no cpap used  . Urinary retention    Past Surgical History:  Procedure Laterality Date  . APPENDECTOMY    . BALLOON DILATION N/A 08/02/2015   Procedure: BALLOON DILATION;  Surgeon: [name], MD;  Location: [facility];  Service: Endoscopy;  Laterality: N/A;  . CARDIOVERSION N/A 05/13/2018   Procedure: CARDIOVERSION;  Surgeon: Sanda Klein, MD;  Location: [facility];  Service: Cardiovascular;  Laterality: N/A;  . CARDIOVERSION N/A 05/18/2018   Procedure: CARDIOVERSION;  Surgeon: Buford Dresser, MD;  Location: [facility];  Service: Cardiovascular;  Laterality: N/A;  . COLONOSCOPY WITH PROPOFOL N/A 08/02/2015   Procedure: COLONOSCOPY WITH PROPOFOL w/ APC;  Surgeon: [name], MD;  Location: [facility];  Service: Endoscopy;  Laterality: N/A;  . ESOPHAGOGASTRODUODENOSCOPY (EGD) WITH PROPOFOL N/A 08/02/2015   Procedure: ESOPHAGOGASTRODUODENOSCOPY (EGD) WITH PROPOFOL/ possible dilation.;  Surgeon: [name], MD;  Location: [facility];  Service: Endoscopy;  Laterality: N/A;  . KNEE SURGERY     right knee orthoscopic  . PROSTATE BIOPSY  12/06/13   Gleason 4+3=7, volume 35 gm  . [place]   Tourette'ssyndrome spinal fluid removed   . TONSILLECTOMY  as child  . TRANSURETHRAL RESECTION OF PROSTATE N/A 09/30/2019   Procedure: TRANSURETHRAL RESECTION OF THE PROSTATE (TURP);  Surgeon: Alexis Frock, MD;  Location[facility];  Service: Urology;  Laterality: N/A;  1 HR     Current Outpatient Medications  Medication Sig Dispense Refill  . atorvastatin (LIPITOR) 40 MG tablet TAKE 1 TABLET BY MOUTH EVERY DAY (Patient taking differently: Take 40 mg by mouth daily. ) 90 tablet 1  . blood glucose meter kit and supplies KIT Test blood sugar daily as directed. Dx code: E11.9 1 each 0  . CALCIUM PO Take 1 tablet by mouth daily.    [name] CINNAMON PO Take 1 capsule by mouth daily.     [name] diltiazem (CARDIZEM CD) 120 MG 24 hr capsule Take 1 capsule (120 mg total) by mouth daily. 30 capsule 3  . furosemide (LASIX) 20 MG tablet Take 1 tablet (20 mg total) by mouth daily as needed for edema. 30 tablet 1  . glucose blood (ACCU-CHEK GUIDE) test strip Use as instructed to check blood sugar twice daily. 100 each 3  . INVOKAMET XR [phone] MG TB24 Take 2 tablets by mouth daily.     [name] leuprolide (LUPRON) 11.25 MG injection Inject 11.25 mg into the muscle every 6 (six) months.     [name] losartan (COZAAR) 50 MG tablet Take 1 tablet (50 mg total) by mouth daily. 90 tablet 1  . metoprolol  tartrate (LOPRESSOR) 100 MG tablet TAKE 1 TABLET BY MOUTH TWICE A DAY (Patient taking differently: Take 100 mg by mouth 2 (two) times daily. ) 180 tablet 2  . Omega-3 Fatty Acids (FISH OIL) 1000 MG CAPS Take by mouth.    [name] OZEMPIC, 1 MG/DOSE, 2 MG/1.5ML SOPN INJECT 1MG UNDER THE SKIN ONCE WEEKLY. (Patient taking differently: Inject 1 mg into the skin once a week. fridays) 9 pen 1  . pantoprazole (PROTONIX) 40 MG tablet Take 1 tablet (40 mg total) by mouth daily. 90 tablet 3  . rivaroxaban (XARELTO) 20 MG TABS tablet Take 20 mg by mouth daily before breakfast.    . tamsulosin (FLOMAX) 0.4 MG CAPS capsule Take 1 capsule (0.4 mg  total) by mouth daily. (Patient taking differently: Take 0.4 mg by mouth 2 (two) times daily. ) 30 capsule 30  . Vitamin D, Cholecalciferol, 400 UNITS TABS Take 400 Units by mouth daily.     No current facility-administered medications for this encounter.    Allergies:   Patient has no known allergies.   Social History:  The patient  reports that he quit smoking about 7 years ago. His smoking use included cigarettes. He has a 11.50 pack-year smoking history. He has never used smokeless tobacco. He reports current alcohol use. He reports that he does not use drugs.   Family History:  The patient's  family history includes COPD in his brother; Cancer in his brother; Diabetes in his brother, father, and mother; Heart disease in his father and mother; Hyperlipidemia in his brother, father, and mother; Hypertension in his brother, father, and mother; Varicose Veins in his mother.    ROS:  Please see the history of present illness.   All other systems are personally reviewed and negative.   Exam:General:  No distress Eyes: EOMI, anicteric ENT: Oral Mucosa clear and moist Cardiovascular: Normal rate, rapid  irregularly irregular rhythm, no murmurs, rubs or gallops, no edema, Respiratory: Normal respiratory effort on room air, lungs clear to auscultation bilaterally Abdomen:  soft, non-distended, non-tender, normal bowel sounds. Wearing a urinary catheter with blood tinged urine. Skin: No Rash Neurologic: Grossly no focal neuro deficit.Mental status AAOx3, speech normal, Psychiatric:Appropriate affect, and mood   Recent Labs: 05/23/2019: Magnesium 2.1; TSH 0.770 08/11/2019: ALT 15 10/08/2019: BUN 26; Creatinine, Ser 1.32; Hemoglobin 12.2; Platelets 289; Potassium 4.3; Sodium 139  personally reviewed    Other studies personally reviewed: Epic records reviewed     ASSESSMENT AND PLAN:  1.  Longstanding persistent atrial fibrillation Since surgery v rates were elevated in the 120's/130's,now rate controlled with addition of cardizem  Continue cardizem 120 mg daily for rate control, amlodipine stopped Continue metoprolol tartrate 100 mg bid   Pt  In past  deferred  AAD therapy Continue xarelto 20 mg daily  This patients CHA2DS2-VASc Score and unadjusted Ischemic Stroke Rate (% per year) is equal to 7.2 % stroke rate/year from a score of 5  2. LEE/shortness of breath with exertion   He had  20 mg lasix 1 1/2 tabs daily x 3 days, then  20 mg on Sunday and today Use as needed going forward He feels he is back to his prior weight, leg edema, breathing impropved Bmet pending    Follow-up: in one month  Current medicines are reviewed at length with the patient today.   The patient does not have concerns regarding his medicines.  The following changes were made today:  none  Labs/ tests ordered today include:none Orders Placed This Encounter  Procedures  . Basic metabolic panel  . EKG 12-Lead    Patient Risk:  after full review of this patients clinical status, I feel that he is  at moderate risk at this time.  Signed, Roderic Palau, NP 10/10/2019 9:36 AM  [facility] [address] [place], [place] [address] [phone]

## 2019-10-11 DIAGNOSIS — R338 Other retention of urine: Secondary | ICD-10-CM | POA: Diagnosis not present

## 2019-10-11 DIAGNOSIS — C61 Malignant neoplasm of prostate: Secondary | ICD-10-CM | POA: Diagnosis not present

## 2019-10-11 DIAGNOSIS — R310 Gross hematuria: Secondary | ICD-10-CM | POA: Diagnosis not present

## 2019-10-11 LAB — URINE CULTURE: Culture: 20000 — AB

## 2019-10-12 ENCOUNTER — Telehealth: Payer: Self-pay | Admitting: Emergency Medicine

## 2019-10-12 DIAGNOSIS — R338 Other retention of urine: Secondary | ICD-10-CM | POA: Diagnosis not present

## 2019-10-12 NOTE — Progress Notes (Signed)
ED Antimicrobial Stewardship Positive Culture Follow Up   Tommy Yang is an 69 y.o. male s/p TURP on 09/30/2019 who presented to [facility]2019 with a chief complaint of hematuria. Patient reports his catheter post procedure was initially removed but he required replacement of Foley on 10/04/2019. No fever per MD notes. WBC 10.6. Patient's Foley catheter was irrigated and pink-tinged urine obtained. Patient without evidence of urinary obstruction. Patient with established appointment with Dr. Tresa Moore of Urology on 10/11/2019.    Recent Results (from the past 720 hour(s))  SARS CORONAVIRUS 2 (TAT 6-24 HRS) Nasopharyngeal Nasopharyngeal Swab     Status: None   Collection Time: 09/27/19  8:36 AM   Specimen: Nasopharyngeal Swab  Result Value Ref Range Status   SARS Coronavirus 2 NEGATIVE NEGATIVE Final    Comment: (NOTE) SARS-CoV-2 target nucleic acids are NOT DETECTED. The SARS-CoV-2 RNA is generally detectable in upper and lower respiratory specimens during the acute phase of infection. Negative results do not preclude SARS-CoV-2 infection, do not rule out co-infections with other pathogens, and should not be used as the sole basis for treatment or other patient management decisions. Negative results must be combined with clinical observations, patient history, and epidemiological information. The expected result is Negative. Fact Sheet for Patients: SugarRoll.be Fact Sheet for Healthcare Providers: https://www.woods-mathews.com/ This test is not yet approved or cleared by the [place] FDA and  has been authorized for detection and/or diagnosis of SARS-CoV-2 by FDA under an Emergency Use Authorization (EUA). This EUA will remain  in effect (meaning this test can be used) for the duration of the COVID-19 declaration under Section 56 4(b)(1) of the Act, 21 U.S.C. section 360bbb-3(b)(1), unless the authorization is terminated or revoked  sooner. Performed at [facility], [place] [address], [place], [place] [address]   Urine Culture     Status: Abnormal   Collection Time: 10/04/19  9:50 PM   Specimen: Urine, Clean Catch  Result Value Ref Range Status   Specimen Description   Final    URINE, CLEAN CATCH Performed at [facility], [place] [address], [place], [place] [address]    Special Requests   Final    NONE Performed at [facility], [place] [address], [place], [place] [address]    Culture (A)  Final    <10,000 COLONIES/mL INSIGNIFICANT GROWTH Performed at [facility] [address] [place], [place] [address]    Report Status 10/05/2019 FINAL  Final  Urine culture     Status: Abnormal   Collection Time: 10/08/19 10:38 AM   Specimen: Urine, Random  Result Value Ref Range Status   Specimen Description   Final    URINE, RANDOM Performed at [facility] [address], [place], [place] [address]    Special Requests   Final    NONE Performed at [facility], [place] [address] [place], [place] [address]    Culture 20,000 COLONIES/mL ESCHERICHIA COLI (A)  Final   Report Status 10/11/2019 FINAL  Final   Organism ID, Bacteria ESCHERICHIA COLI (A)  Final      Susceptibility   Escherichia coli - MIC*    AMPICILLIN >=32 RESISTANT Resistant     CEFAZOLIN <=4 SENSITIVE Sensitive     CEFTRIAXONE <=0.25 SENSITIVE Sensitive     CIPROFLOXACIN >=4 RESISTANT Resistant     GENTAMICIN 4 SENSITIVE Sensitive     IMIPENEM <=0.25 SENSITIVE Sensitive     NITROFURANTOIN <=16  SENSITIVE Sensitive     TRIMETH/SULFA <=20 SENSITIVE Sensitive     AMPICILLIN/SULBACTAM 16 INTERMEDIATE Intermediate     PIP/TAZO <=4 SENSITIVE Sensitive     * 20,000 COLONIES/mL ESCHERICHIA COLI    Plan: Please fax these urine culture results to Dr. Zettie Pho office ([facility]) for him to review and determine if patient requires antibiotic therapy.   ED Provider: Dr.  Carmin Muskrat   Lindell Spar M 10/12/2019, 12:33 PM Clinical Pharmacist [phone]

## 2019-10-12 NOTE — Telephone Encounter (Signed)
Post ED Visit - Positive Culture Follow-up  Culture report reviewed by antimicrobial stewardship pharmacist: [place] Team []  Elenor Quinones, Pharm.D. []  Heide Guile, Pharm.D., BCPS AQ-ID []  Parks Neptune, Pharm.D., BCPS []  Alycia Rossetti, Pharm.D., BCPS []  [place], [place].D., BCPS, AAHIVP []  Legrand Como, Pharm.D., BCPS, AAHIVP []  Salome Arnt, PharmD, BCPS []  Johnnette Gourd, PharmD, BCPS []  Hughes Better, PharmD, BCPS []  Leeroy Cha, PharmD []  Laqueta Linden, PharmD, BCPS []  Albertina Parr, PharmD  [place] Team []  Leodis Sias, PharmD [x]  Lindell Spar, PharmD []  Royetta Asal, PharmD []  Graylin Shiver, Rph []  Rema Fendt) Glennon Mac, PharmD []  Arlyn Dunning, PharmD []  [name], PharmD []  Dia Sitter, PharmD []  Leone Haven, PharmD []  Gretta Arab, PharmD []  Theodis Shove, PharmD []  Peggyann Juba, PharmD []  Reuel Boom, PharmD   Positive urine culture Treated with none, faxed report to [facility] attn Dr Tresa Moore, no further patient follow-up is required at this time.  Hazle Nordmann 10/12/2019, 12:49 PM

## 2019-10-16 ENCOUNTER — Other Ambulatory Visit: Payer: Self-pay | Admitting: Internal Medicine

## 2019-10-16 DIAGNOSIS — I10 Essential (primary) hypertension: Secondary | ICD-10-CM

## 2019-10-16 NOTE — Telephone Encounter (Signed)
Please refill as per office routine med refill policy (all routine meds refilled for 3 mo or monthly per pt preference up to one year from last visit, then month to month grace period for 3 mo, then further med refills will have to be denied)  

## 2019-10-21 ENCOUNTER — Other Ambulatory Visit: Payer: Self-pay

## 2019-10-21 ENCOUNTER — Telehealth: Payer: Self-pay | Admitting: Endocrinology

## 2019-10-21 ENCOUNTER — Other Ambulatory Visit: Payer: Self-pay | Admitting: Internal Medicine

## 2019-10-21 MED ORDER — GLIMEPIRIDE 1 MG PO TABS: 1.0000 mg | ORAL_TABLET | Freq: Every day | ORAL | 2 refills | Status: DC

## 2019-10-21 NOTE — Telephone Encounter (Signed)
na

## 2019-10-21 NOTE — Telephone Encounter (Signed)
Please refill as per office routine med refill policy (all routine meds refilled for 3 mo or monthly per pt preference up to one year from last visit, then month to month grace period for 3 mo, then further med refills will have to be denied)  

## 2019-10-31 ENCOUNTER — Telehealth: Payer: Self-pay | Admitting: Endocrinology

## 2019-10-31 ENCOUNTER — Other Ambulatory Visit: Payer: Self-pay

## 2019-10-31 DIAGNOSIS — N5201 Erectile dysfunction due to arterial insufficiency: Secondary | ICD-10-CM | POA: Diagnosis not present

## 2019-10-31 DIAGNOSIS — C61 Malignant neoplasm of prostate: Secondary | ICD-10-CM | POA: Diagnosis not present

## 2019-10-31 MED ORDER — INVOKAMET XR 150-1000 MG PO TB24: 2.0000 | ORAL_TABLET | Freq: Every day | ORAL | 2 refills | Status: DC

## 2019-10-31 NOTE — Telephone Encounter (Signed)
MEDICATION: Invokana    PHARMACY:  [facility] [address] :   IS PATIENT OUT OF MEDICATION: yes  IF NOT; HOW MUCH IS LEFT:   LAST APPOINTMENT DATE: @2 /19/2021  NEXT APPOINTMENT DATE:@3 /19/2021  DO WE HAVE YOUR PERMISSION TO LEAVE A DETAILED MESSAGE: yes  OTHER COMMENTS:    **Let patient know to contact pharmacy at the end of the day to make sure medication is ready. **  ** Please notify patient to allow 48-72 hours to process**  **Encourage patient to contact the pharmacy for refills or they can request refills through [facility]**

## 2019-10-31 NOTE — Telephone Encounter (Signed)
Rx sent 

## 2019-11-02 DIAGNOSIS — R338 Other retention of urine: Secondary | ICD-10-CM | POA: Diagnosis not present

## 2019-11-02 DIAGNOSIS — R300 Dysuria: Secondary | ICD-10-CM | POA: Diagnosis not present

## 2019-11-02 DIAGNOSIS — B9620 Unspecified Escherichia coli [E. coli] as the cause of diseases classified elsewhere: Secondary | ICD-10-CM | POA: Diagnosis not present

## 2019-11-02 DIAGNOSIS — N390 Urinary tract infection, site not specified: Secondary | ICD-10-CM | POA: Diagnosis not present

## 2019-11-03 ENCOUNTER — Other Ambulatory Visit: Payer: Self-pay

## 2019-11-03 MED ORDER — RIVAROXABAN 20 MG PO TABS: 20.0000 mg | ORAL_TABLET | Freq: Every day | ORAL | 5 refills | Status: DC

## 2019-11-08 ENCOUNTER — Ambulatory Visit (HOSPITAL_COMMUNITY)
Admission: RE | Admit: 2019-11-08 | Discharge: 2019-11-08 | Disposition: A | Discharge status: Home / Self Care | Payer: BC Managed Care – PPO | Source: Ambulatory Visit | Attending: Nurse Practitioner | Admitting: Nurse Practitioner

## 2019-11-08 ENCOUNTER — Encounter (HOSPITAL_COMMUNITY): Payer: Self-pay | Admitting: Nurse Practitioner

## 2019-11-08 ENCOUNTER — Other Ambulatory Visit: Payer: Self-pay

## 2019-11-08 ENCOUNTER — Other Ambulatory Visit (HOSPITAL_COMMUNITY): Payer: Self-pay | Admitting: Nurse Practitioner

## 2019-11-08 VITALS — BP 120/68 | HR 82 | Ht 71.0 in | Wt 306.8 lb

## 2019-11-08 DIAGNOSIS — Z8249 Family history of ischemic heart disease and other diseases of the circulatory system: Secondary | ICD-10-CM | POA: Diagnosis not present

## 2019-11-08 DIAGNOSIS — E119 Type 2 diabetes mellitus without complications: Secondary | ICD-10-CM | POA: Insufficient documentation

## 2019-11-08 DIAGNOSIS — D6869 Other thrombophilia: Secondary | ICD-10-CM

## 2019-11-08 DIAGNOSIS — Z79899 Other long term (current) drug therapy: Secondary | ICD-10-CM | POA: Diagnosis not present

## 2019-11-08 DIAGNOSIS — Z7984 Long term (current) use of oral hypoglycemic drugs: Secondary | ICD-10-CM | POA: Insufficient documentation

## 2019-11-08 DIAGNOSIS — Z86718 Personal history of other venous thrombosis and embolism: Secondary | ICD-10-CM | POA: Insufficient documentation

## 2019-11-08 DIAGNOSIS — K219 Gastro-esophageal reflux disease without esophagitis: Secondary | ICD-10-CM | POA: Insufficient documentation

## 2019-11-08 DIAGNOSIS — I10 Essential (primary) hypertension: Secondary | ICD-10-CM | POA: Diagnosis not present

## 2019-11-08 DIAGNOSIS — Z87891 Personal history of nicotine dependence: Secondary | ICD-10-CM | POA: Insufficient documentation

## 2019-11-08 DIAGNOSIS — Z833 Family history of diabetes mellitus: Secondary | ICD-10-CM | POA: Insufficient documentation

## 2019-11-08 DIAGNOSIS — G4730 Sleep apnea, unspecified: Secondary | ICD-10-CM | POA: Insufficient documentation

## 2019-11-08 DIAGNOSIS — Z923 Personal history of irradiation: Secondary | ICD-10-CM | POA: Diagnosis not present

## 2019-11-08 DIAGNOSIS — R600 Localized edema: Secondary | ICD-10-CM | POA: Diagnosis not present

## 2019-11-08 DIAGNOSIS — Z825 Family history of asthma and other chronic lower respiratory diseases: Secondary | ICD-10-CM | POA: Insufficient documentation

## 2019-11-08 DIAGNOSIS — I4891 Unspecified atrial fibrillation: Secondary | ICD-10-CM | POA: Diagnosis present

## 2019-11-08 DIAGNOSIS — I4819 Other persistent atrial fibrillation: Secondary | ICD-10-CM | POA: Diagnosis not present

## 2019-11-08 DIAGNOSIS — Z7901 Long term (current) use of anticoagulants: Secondary | ICD-10-CM | POA: Diagnosis not present

## 2019-11-08 DIAGNOSIS — M1990 Unspecified osteoarthritis, unspecified site: Secondary | ICD-10-CM | POA: Insufficient documentation

## 2019-11-08 DIAGNOSIS — R0602 Shortness of breath: Secondary | ICD-10-CM | POA: Diagnosis not present

## 2019-11-08 DIAGNOSIS — I4821 Permanent atrial fibrillation: Secondary | ICD-10-CM | POA: Diagnosis not present

## 2019-11-08 DIAGNOSIS — E7800 Pure hypercholesterolemia, unspecified: Secondary | ICD-10-CM | POA: Insufficient documentation

## 2019-11-08 DIAGNOSIS — Z8546 Personal history of malignant neoplasm of prostate: Secondary | ICD-10-CM | POA: Insufficient documentation

## 2019-11-08 MED ORDER — AMLODIPINE BESYLATE 2.5 MG PO TABS: 2.5000 mg | ORAL_TABLET | Freq: Every day | ORAL | 1 refills | Status: DC

## 2019-11-08 MED ORDER — DILTIAZEM HCL 30 MG PO TABS: ORAL_TABLET | ORAL | 1 refills | Status: DC

## 2019-11-08 NOTE — Progress Notes (Signed)
Date:  11/08/2019   ID:  Tommy Yang, DOB 1951/06/20, MRN [id number]  Location: home   Provider location: [address] [place], [place] [address] Evaluation Performed:Follow up   PCP:  [name], MD  Primary Cardiologist:  None  Primary Electrophysiologist: none   KC:[id number] fibrillation   History of Present Illness: Tommy Yang is a 69 y.o. male who presents for f/u today. He has longstanding persistent afib. He feels he may have had afib for 2 years befor he was diagnosed.  On previous visits, he has been offered AAD therapy which he has deferred as he feels he is minimally symptomatic. He does have some snoring. Possible apnea, but  pt states he would not be able to deal with a mask to treat sleep apnea. He scratched  his lower leg and being diabetic, has an appointment with the wound center to make sure it is healing.  6 month f/u in afib clinic, 09/03/19. He is in rate controlled afib. He reports going to ER  In September after the flu shot with afib with RVR. He was rate controlled with IV Cardizem and discharged home on same meds. He is having issues now with urinary retention with h/o prostate CA. He is wearing a urinary catheter. He is due to go back to urologist tomorrow for removal but if he can not urinate , he was told he will have to have surgery. He is having intermittent hematuria now. His prior leg wound did heal. Continues on xarelto with a CHA2DS2VASc score of 5.he is on tumeric and fish oil which may be contributing to hematuria.  In afib clinic, 10/05/19. He had post chanel TURP on 09/30/19 and had to go back to ER yesterday  to have foley reinserted after not being able to void since it was removed earlier that am. Urine had clots in the ER but was irrigated to pink color and remains that way today. He is back on xarelto. Pt asked to be seen today after increase in HR into the 120-130's and has noted swelling in his lower extremities. He does not describe   any PND/orthopnea. He feels nervous with the increase of HR and short of breath with exertion.[name] overall weight is down one lb today but his LE's are swollen.  F/u in afib clinic, 10/10/19. He feels better with lasix and addition of Cardizem to slow HR. He took lasix x 5 days and now his weight is back down to his normal. He will stay on Cardizem. Bmet drawn today.He continues with foley cath with pinkish urine and sees urologist tomorrow.   F/u 11/08/19. His catheter has been removed and he is doing much better with voiding. No recent hematuria. His heart rate is now controlled afib back in the  80's and he stopped taking the diltiazem daily and went back on amlodipine. BP well controlled.   Today, he denies symptoms of palpitations, chest pain, shortness of breath, orthopnea, PND, lower extremity edema, claudication, dizziness, presyncope, syncope, bleeding, or neurologic sequela. The patient is tolerating medications without difficulties and is otherwise without complaint today.   he denies symptoms of cough, fevers, chills, or new SOB worrisome for COVID 19.    Filed Weights   11/08/19 0838  Weight: (!) 139.2 kg    Past Medical History:  Diagnosis Date  . Arthritis   . Atrial fibrillation ([facility])   . Diabetes mellitus without complication ([facility])    type 2  .  DVT (deep venous thrombosis) ([facility]) 2015   left leg.  . Foley catheter in place last changed 2eeks ago   last 7 months  . GERD (gastroesophageal reflux disease)   . Hx of radiation therapy   . Hypercholesterolemia   . Hypertension   . Prostate cancer ([facility]) 12/06/13   gleason 4+3=7, 6/12 cores positive. seed implant and radiation  . Sleep apnea    no cpap used  . Urinary retention    Past Surgical History:  Procedure Laterality Date  . APPENDECTOMY    . BALLOON DILATION N/A 08/02/2015   Procedure: BALLOON DILATION;  Surgeon: [name], MD;  Location: [facility];  Service: Endoscopy;  Laterality: N/A;  . CARDIOVERSION N/A  05/13/2018   Procedure: CARDIOVERSION;  Surgeon: Sanda Klein, MD;  Location: [facility];  Service: Cardiovascular;  Laterality: N/A;  . CARDIOVERSION N/A 05/18/2018   Procedure: CARDIOVERSION;  Surgeon: Buford Dresser, MD;  Location: [facility];  Service: Cardiovascular;  Laterality: N/A;  . COLONOSCOPY WITH PROPOFOL N/A 08/02/2015   Procedure: COLONOSCOPY WITH PROPOFOL w/ APC;  Surgeon: [name], MD;  Location: [facility];  Service: Endoscopy;  Laterality: N/A;  . ESOPHAGOGASTRODUODENOSCOPY (EGD) WITH PROPOFOL N/A 08/02/2015   Procedure: ESOPHAGOGASTRODUODENOSCOPY (EGD) WITH PROPOFOL/ possible dilation.;  Surgeon: [name], MD;  Location: [facility];  Service: Endoscopy;  Laterality: N/A;  . KNEE SURGERY     right knee orthoscopic  . PROSTATE BIOPSY  12/06/13   Gleason 4+3=7, volume 35 gm  . [place]   Tourette'ssyndrome spinal fluid removed   . TONSILLECTOMY  as child  . TRANSURETHRAL RESECTION OF PROSTATE N/A 09/30/2019   Procedure: TRANSURETHRAL RESECTION OF THE PROSTATE (TURP);  Surgeon: Alexis Frock, MD;  Location: [facility];  Service: Urology;  Laterality: N/A;  1 HR     Current Outpatient Medications  Medication Sig Dispense Refill  . amLODipine (NORVASC) 2.5 MG tablet Take 1 tablet (2.5 mg total) by mouth daily. 90 tablet 1  . atorvastatin (LIPITOR) 40 MG tablet TAKE 1 TABLET BY MOUTH EVERY DAY (Patient taking differently: Take 40 mg by mouth daily. ) 90 tablet 1  . blood glucose meter kit and supplies KIT Test blood sugar daily as directed. Dx code: E11.9 1 each 0  . CALCIUM PO Take 1,000 tablets by mouth daily.     [name] CINNAMON PO Take 500 capsules by mouth daily.     [name] doxycycline (VIBRA-TABS) 100 MG tablet Take 100 mg by mouth 2 (two) times daily.    . furosemide (LASIX) 20 MG tablet Take 1 tablet (20 mg total) by mouth daily as needed for edema. (Patient taking differently: Take 20 mg by mouth as needed for  edema. ) 30 tablet 1  . glimepiride (AMARYL) 1 MG tablet Take 1 tablet (1 mg total) by mouth daily with breakfast. 30 tablet 2  . glucose blood (ACCU-CHEK GUIDE) test strip Use as instructed to check blood sugar twice daily. 100 each 3  . INVOKAMET XR [phone] MG TB24 Take 2 tablets by mouth daily. 60 tablet 2  . leuprolide (LUPRON) 11.25 MG injection Inject 11.25 mg into the muscle every 6 (six) months.     [name] losartan (COZAAR) 50 MG tablet TAKE 1 TABLET BY MOUTH EVERY DAY 90 tablet 1  . metoprolol tartrate (LOPRESSOR) 100 MG tablet TAKE 1 TABLET BY MOUTH TWICE A DAY (Patient taking differently: Take 100 mg by mouth 2 (two) times daily. ) 180 tablet  2  . Omega-3 Fatty Acids (FISH OIL) 1000 MG CAPS Take 1,000 mg by mouth daily.     [name] OZEMPIC, 1 MG/DOSE, 2 MG/1.5ML SOPN INJECT 1MG UNDER THE SKIN ONCE WEEKLY. (Patient taking differently: Inject 1 mg into the skin once a week. fridays) 9 pen 1  . pantoprazole (PROTONIX) 40 MG tablet Take 1 tablet (40 mg total) by mouth daily. 90 tablet 3  . rivaroxaban (XARELTO) 20 MG TABS tablet Take 1 tablet (20 mg total) by mouth daily before breakfast. 30 tablet 5  . tamsulosin (FLOMAX) 0.4 MG CAPS capsule Take 1 capsule (0.4 mg total) by mouth daily. (Patient taking differently: Take 0.4 mg by mouth 2 (two) times daily. ) 30 capsule 30  . Vitamin D, Cholecalciferol, 400 UNITS TABS Take 400 Units by mouth daily.    [name] diltiazem (CARDIZEM) 30 MG tablet Take 1 Tablet Every 4 Hours As Needed For HR >100 45 tablet 1   No current facility-administered medications for this encounter.    Allergies:   Patient has no known allergies.   Social History:  The patient  reports that he quit smoking about 7 years ago. His smoking use included cigarettes. He has a 11.50 pack-year smoking history. He has never used smokeless tobacco. He reports current alcohol use. He reports that he does not use drugs.   Family History:  The patient's  family history includes COPD in his  brother; Cancer in his brother; Diabetes in his brother, father, and mother; Heart disease in his father and mother; Hyperlipidemia in his brother, father, and mother; Hypertension in his brother, father, and mother; Varicose Veins in his mother.    ROS:  Please see the history of present illness.   All other systems are personally reviewed and negative.   Exam:General:  No distress Eyes: EOMI, anicteric ENT: Oral Mucosa clear and moist Cardiovascular: regular rhythm, no murmurs, rubs or gallops, no edema, Respiratory: Normal respiratory effort on room air, lungs clear to auscultation bilaterally Abdomen: soft, non-distended, non-tender, normal bowel sounds. Wearing a urinary catheter with blood tinged urine. Skin: No Rash Neurologic: Grossly no focal neuro deficit.Mental status AAOx3, speech normal, Psychiatric:Appropriate affect, and mood   Recent Labs: 05/23/2019: Magnesium 2.1; TSH 0.770 08/11/2019: ALT 15 10/08/2019: Hemoglobin 12.2; Platelets 289 10/10/2019: BUN 37; Creatinine, Ser 1.83; Potassium 4.6; Sodium 136  personally reviewed    Other studies personally reviewed: Epic records reviewed     ASSESSMENT AND PLAN:  1.  Longstanding persistent atrial fibrillation Since surgery v rates were elevated in the 120's/130's,now rate controlled without daily diltiazem Will rx short acting cardizem 30 mg if needed He went back on amlodipine 2.5 mg daily with good control BP Continue metoprolol tartrate 100 mg bid   Pt has  deferred  AAD therapy Continue xarelto 20 mg daily  This patients CHA2DS2-VASc Score and unadjusted Ischemic Stroke Rate (% per year) is equal to 7.2 % stroke rate/year from a score of 5  2. LEE/shortness of breath with exertion  Resolved with lasix post urology surgery  Now uses prn  Follow-up: in July  Current medicines are reviewed at length with the patient today.   The patient does not have concerns regarding his medicines.  The following changes were  made today:  none  Labs/ tests ordered today include:none Orders Placed This Encounter  Procedures  . EKG 12-Lead      Signed, Roderic Palau, NP 11/08/2019 8:58 AM  [facility] [address]  [place], [place] [address] [phone]

## 2019-11-11 DIAGNOSIS — C61 Malignant neoplasm of prostate: Secondary | ICD-10-CM | POA: Diagnosis not present

## 2019-11-18 ENCOUNTER — Other Ambulatory Visit: Payer: Self-pay

## 2019-11-18 ENCOUNTER — Other Ambulatory Visit (INDEPENDENT_AMBULATORY_CARE_PROVIDER_SITE_OTHER): Payer: BC Managed Care – PPO

## 2019-11-18 DIAGNOSIS — E782 Mixed hyperlipidemia: Secondary | ICD-10-CM

## 2019-11-18 DIAGNOSIS — E1165 Type 2 diabetes mellitus with hyperglycemia: Secondary | ICD-10-CM | POA: Diagnosis not present

## 2019-11-18 LAB — COMPREHENSIVE METABOLIC PANEL
ALT: 9 U/L (ref 0–53)
AST: 8 U/L (ref 0–37)
Albumin: 3.8 g/dL (ref 3.5–5.2)
Alkaline Phosphatase: 83 U/L (ref 39–117)
BUN: 22 mg/dL (ref 6–23)
CO2: 28 mEq/L (ref 19–32)
Calcium: 9.3 mg/dL (ref 8.4–10.5)
Chloride: 106 mEq/L (ref 96–112)
Creatinine, Ser: 1.12 mg/dL (ref 0.40–1.50)
GFR: 65.37 mL/min (ref >60.00)
Glucose, Bld: 109 mg/dL — ABNORMAL HIGH (ref 70–99)
Potassium: 4.8 mEq/L (ref 3.5–5.1)
Sodium: 140 mEq/L (ref 135–145)
Total Bilirubin: 0.6 mg/dL (ref 0.2–1.2)
Total Protein: 7 g/dL (ref 6.0–8.3)

## 2019-11-18 LAB — LIPID PANEL
Cholesterol: 144 mg/dL (ref 0–200)
HDL: 37.3 mg/dL — ABNORMAL LOW (ref >39.00)
LDL Cholesterol: 83 mg/dL (ref 0–99)
NonHDL: 106.44
Total CHOL/HDL Ratio: 4
Triglycerides: 117 mg/dL (ref 0.0–149.0)
VLDL: 23.4 mg/dL (ref 0.0–40.0)

## 2019-11-18 LAB — HEMOGLOBIN A1C: Hgb A1c MFr Bld: 6.6 % — ABNORMAL HIGH (ref 4.6–6.5)

## 2019-11-22 ENCOUNTER — Other Ambulatory Visit: Payer: Self-pay

## 2019-11-22 ENCOUNTER — Ambulatory Visit (INDEPENDENT_AMBULATORY_CARE_PROVIDER_SITE_OTHER): Payer: BC Managed Care – PPO | Admitting: Internal Medicine

## 2019-11-22 ENCOUNTER — Encounter: Payer: Self-pay | Admitting: Internal Medicine

## 2019-11-22 VITALS — BP 132/70 | HR 54 | Temp 97.8°F | Ht 71.0 in | Wt 306.8 lb

## 2019-11-22 DIAGNOSIS — E785 Hyperlipidemia, unspecified: Secondary | ICD-10-CM

## 2019-11-22 DIAGNOSIS — N390 Urinary tract infection, site not specified: Secondary | ICD-10-CM | POA: Diagnosis not present

## 2019-11-22 DIAGNOSIS — E559 Vitamin D deficiency, unspecified: Secondary | ICD-10-CM

## 2019-11-22 DIAGNOSIS — I10 Essential (primary) hypertension: Secondary | ICD-10-CM | POA: Diagnosis not present

## 2019-11-22 DIAGNOSIS — E1165 Type 2 diabetes mellitus with hyperglycemia: Secondary | ICD-10-CM

## 2019-11-22 DIAGNOSIS — E538 Deficiency of other specified B group vitamins: Secondary | ICD-10-CM

## 2019-11-22 DIAGNOSIS — Z0000 Encounter for general adult medical examination without abnormal findings: Secondary | ICD-10-CM

## 2019-11-22 LAB — URINALYSIS, ROUTINE W REFLEX MICROSCOPIC
Bilirubin Urine: NEGATIVE
Ketones, ur: NEGATIVE
Nitrite: NEGATIVE
Specific Gravity, Urine: 1.02 (ref 1.000–1.030)
Total Protein, Urine: 30 — AB
Urine Glucose: >=1000 — AB
Urobilinogen, UA: 0.2 (ref 0.0–1.0)
pH: 5.5 (ref 5.0–8.0)

## 2019-11-22 MED ORDER — RIVAROXABAN 20 MG PO TABS: 20.0000 mg | ORAL_TABLET | Freq: Every day | ORAL | 1 refills | Status: DC

## 2019-11-22 MED ORDER — DOXYCYCLINE HYCLATE 100 MG PO TABS: 100.0000 mg | ORAL_TABLET | Freq: Two times a day (BID) | ORAL | 0 refills | Status: DC

## 2019-11-22 MED ORDER — ATORVASTATIN CALCIUM 40 MG PO TABS: ORAL_TABLET | ORAL | 3 refills | Status: DC

## 2019-11-22 MED ORDER — AMLODIPINE BESYLATE 2.5 MG PO TABS: 2.5000 mg | ORAL_TABLET | Freq: Every day | ORAL | 3 refills | Status: DC

## 2019-11-22 NOTE — Progress Notes (Signed)
 Subjective:    Patient ID: Tommy Yang, male    DOB: 03/04/1951, 69 y.o.   MRN: [id number]  HPI Here with hx of recent LUTS with urinary retention and recurrent infections, followed per urology, has been to ED 3 times as well, fortunatlye now in the past 2 wks has done relatively well without retention, drinking plenty of fluids but had milder symptoms , had urine studies, started on doxycycine and finished 10 days, did well but now burning coming back and odor, and pt states told per urology his problems now liekly related to prior catheter in place for 44 days.  Denies urinary symptoms such as frequency, urgency, flank pain, hematuria or n/v, fever, chills, or at least no blood in over 1 wk. Remains on xarelto with hx afib Past Medical History:  Diagnosis Date  . Arthritis   . Atrial fibrillation ([facility])   . Diabetes mellitus without complication ([facility])    type 2  . DVT (deep venous thrombosis) ([facility]) 2015   left leg.  . Foley catheter in place last changed 2eeks ago   last 7 months  . GERD (gastroesophageal reflux disease)   . Hx of radiation therapy   . Hypercholesterolemia   . Hypertension   . Prostate cancer ([facility]) 12/06/13   gleason 4+3=7, 6/12 cores positive. seed implant and radiation  . Sleep apnea    no cpap used  . Urinary retention    Past Surgical History:  Procedure Laterality Date  . APPENDECTOMY    . BALLOON DILATION N/A 08/02/2015   Procedure: BALLOON DILATION;  Surgeon: Daniel P Jacobs, MD;  Location: [facility];  Service: Endoscopy;  Laterality: N/A;  . CARDIOVERSION N/A 05/13/2018   Procedure: CARDIOVERSION;  Surgeon: Croitoru, Mihai, MD;  Location: [facility];  Service: Cardiovascular;  Laterality: N/A;  . CARDIOVERSION N/A 05/18/2018   Procedure: CARDIOVERSION;  Surgeon: Christopher, Bridgette, MD;  Location: [facility];  Service: Cardiovascular;  Laterality: N/A;  . COLONOSCOPY WITH PROPOFOL N/A 08/02/2015   Procedure: COLONOSCOPY WITH PROPOFOL w/ APC;   Surgeon: Daniel P Jacobs, MD;  Location: [facility];  Service: Endoscopy;  Laterality: N/A;  . ESOPHAGOGASTRODUODENOSCOPY (EGD) WITH PROPOFOL N/A 08/02/2015   Procedure: ESOPHAGOGASTRODUODENOSCOPY (EGD) WITH PROPOFOL/ possible dilation.;  Surgeon: Daniel P Jacobs, MD;  Location: [facility];  Service: Endoscopy;  Laterality: N/A;  . KNEE SURGERY     right knee orthoscopic  . PROSTATE BIOPSY  12/06/13   Gleason 4+3=7, volume 35 gm  . SPINE SURGERY  1963 and 1967   Tourette'ssyndrome spinal fluid removed   . TONSILLECTOMY  as child  . TRANSURETHRAL RESECTION OF PROSTATE N/A 09/30/2019   Procedure: TRANSURETHRAL RESECTION OF THE PROSTATE (TURP);  Surgeon: Manny, Theodore, MD;  Location: [facility];  Service: Urology;  Laterality: N/A;  1 HR    reports that he quit smoking about 7 years ago. His smoking use included cigarettes. He has a 11.50 pack-year smoking history. He has never used smokeless tobacco. He reports current alcohol use. He reports that he does not use drugs. family history includes COPD in his brother; Cancer in his brother; Diabetes in his brother, father, and mother; Heart disease in his father and mother; Hyperlipidemia in his brother, father, and mother; Hypertension in his brother, father, and mother; Varicose Veins in his mother. No Known Allergies Current Outpatient Medications on File Prior to Visit  Medication Sig Dispense Refill  . blood glucose meter kit and supplies KIT Test blood sugar daily as directed.   Dx code: E11.9 1 each 0  . CALCIUM PO Take 1,000 tablets by mouth daily.     . CINNAMON PO Take 500 capsules by mouth daily.     . diltiazem (CARDIZEM) 30 MG tablet Take 1 Tablet Every 4 Hours As Needed For HR >100 45 tablet 1  . furosemide (LASIX) 20 MG tablet TAKE 1 TABLET (20 MG TOTAL) BY MOUTH DAILY AS NEEDED FOR EDEMA. 30 tablet 4  . glimepiride (AMARYL) 1 MG tablet Take 1 tablet (1 mg total) by mouth daily with breakfast. 30 tablet 2  . glucose  blood (ACCU-CHEK GUIDE) test strip Use as instructed to check blood sugar twice daily. 100 each 3  . INVOKAMET XR 150-1000 MG TB24 Take 2 tablets by mouth daily. 60 tablet 2  . leuprolide (LUPRON) 11.25 MG injection Inject 11.25 mg into the muscle every 6 (six) months.     . losartan (COZAAR) 50 MG tablet TAKE 1 TABLET BY MOUTH EVERY DAY 90 tablet 1  . metoprolol tartrate (LOPRESSOR) 100 MG tablet TAKE 1 TABLET BY MOUTH TWICE A DAY (Patient taking differently: Take 100 mg by mouth 2 (two) times daily. ) 180 tablet 2  . OZEMPIC, 1 MG/DOSE, 2 MG/1.5ML SOPN INJECT 1MG UNDER THE SKIN ONCE WEEKLY. (Patient taking differently: Inject 1 mg into the skin once a week. fridays) 9 pen 1  . pantoprazole (PROTONIX) 40 MG tablet Take 1 tablet (40 mg total) by mouth daily. 90 tablet 3  . tamsulosin (FLOMAX) 0.4 MG CAPS capsule Take 1 capsule (0.4 mg total) by mouth daily. (Patient taking differently: Take 0.4 mg by mouth 2 (two) times daily. ) 30 capsule 30  . Vitamin D, Cholecalciferol, 400 UNITS TABS Take 400 Units by mouth daily.     No current facility-administered medications on file prior to visit.   Review of Systems All otherwise neg per pt     Objective:   Physical Exam BP 132/70   Pulse (!) 54   Temp 97.8 F (36.6 C)   Ht 5' 11" (1.803 m)   Wt (!) 306 lb 12.8 oz (139.2 kg)   SpO2 98%   BMI 42.79 kg/m  VS noted,  Constitutional: Pt appears in NAD HENT: Head: NCAT.  Right Ear: External ear normal.  Left Ear: External ear normal.  Eyes: . Pupils are equal, round, and reactive to light. Conjunctivae and EOM are normal Nose: without d/c or deformity Neck: Neck supple. Gross normal ROM Cardiovascular: Normal rate and regular rhythm.   Pulmonary/Chest: Effort normal and breath sounds without rales or wheezing.  Abd:  Soft, NT, ND, + BS, no organomegaly Neurological: Pt is alert. At baseline orientation, motor grossly intact Skin: Skin is warm. No rashes, other new lesions, no LE  edema Psychiatric: Pt behavior is normal without agitation  All otherwise neg per pt Lab Results  Component Value Date   WBC 10.6 (H) 10/08/2019   HGB 12.2 (L) 10/08/2019   HCT 37.7 (L) 10/08/2019   PLT 289 10/08/2019   GLUCOSE 109 (H) 11/18/2019   CHOL 144 11/18/2019   TRIG 117.0 11/18/2019   HDL 37.30 (L) 11/18/2019   LDLDIRECT 93.0 12/30/2016   LDLCALC 83 11/18/2019   ALT 9 11/18/2019   AST 8 11/18/2019   NA 140 11/18/2019   K 4.8 11/18/2019   CL 106 11/18/2019   CREATININE 1.12 11/18/2019   BUN 22 11/18/2019   CO2 28 11/18/2019   TSH 0.770 05/23/2019   PSA 6.18 (H) 05/06/2013     INR 1.55 (H) 10/19/2013   HGBA1C 6.6 (H) 11/18/2019   MICROALBUR 4.2 (H) 02/03/2019      Assessment & Plan:

## 2019-11-22 NOTE — Patient Instructions (Signed)
Please take all new medication as prescribed - the antibiotic  Please continue all other medications as before, and refills have been done if requested.  Please have the pharmacy call with any other refills you may need.  Please continue your efforts at being more active, low cholesterol diet, and weight control..  Please keep your appointments with your specialists as you may have planned  Please go to the LAB at the blood drawing area for the tests to be done - just the urine testing today  Please make an Appointment to return in 6 months, or sooner if needed, also with Lab Appointment for testing done 3-5 days before at the [facility] (so this is for TWO appointments - please see the scheduling desk as you leave)

## 2019-11-24 ENCOUNTER — Ambulatory Visit (INDEPENDENT_AMBULATORY_CARE_PROVIDER_SITE_OTHER): Payer: BC Managed Care – PPO | Admitting: Endocrinology

## 2019-11-24 ENCOUNTER — Other Ambulatory Visit: Payer: Self-pay

## 2019-11-24 ENCOUNTER — Encounter: Payer: Self-pay | Admitting: Endocrinology

## 2019-11-24 DIAGNOSIS — E1165 Type 2 diabetes mellitus with hyperglycemia: Secondary | ICD-10-CM

## 2019-11-24 DIAGNOSIS — E782 Mixed hyperlipidemia: Secondary | ICD-10-CM | POA: Diagnosis not present

## 2019-11-24 DIAGNOSIS — I10 Essential (primary) hypertension: Secondary | ICD-10-CM

## 2019-11-24 LAB — URINE CULTURE

## 2019-11-24 NOTE — Progress Notes (Signed)
Patient ID: Tommy Yang, male   DOB: [birth year], 69 y.o.   MRN: [id number]           Reason for Appointment: Follow-up for Type 2 Diabetes  Today's office visit was provided via telemedicine using video technique The patient was explained the limitations of evaluation and management by telemedicine and the availability of in person appointments.  The patient understood the limitations and agreed to proceed. Patient also understood that the telehealth visit is billable. . Location of the patient: Patient's home . Location of the provider: Physician office Only the patient and myself were participating in the encounter     History of Present Illness:          Diagnosis: Type 2 diabetes mellitus, date of diagnosis: 05/2013       Past history:   He apparently had significant hyperglycemia in 2014 when seen in the urgent care center but did not establish with a PCP for control. He was started on treatment for his diabetes in 10/2013 and he was initially treated with metformin and Januvia A few months later he was also given glipizide ER to help his control when his A1c had gone up to 10.9 He says he was also seen by dietitian but no record available of this. Subsequently his blood sugar control had improved with A1c down to 6.8 in 9/15 On his consultation in 3/16 he had high blood sugars and weight gain; A1c was 9.1% with taking glipizide ER, Januvia and metformin.  Recent history:    Non-insulin hypoglycemic drugs the patient is taking are: Amaryl 1 mg at dinner, Invokamet XR 150/1000, 2 tablets daily, Ozempic 1 mg weekly  His A1c has improved to 6.6 from 7.3   Current blood sugar patterns and problems identified:  He has been back on Invokamet XR instead of Synjardy which he felt was not controlling his sugars as well  Checking blood sugars again with the insurance company sponsored meter which appears to be not accurate and was reading 131 when his lab blood sugar was 109  at about the same time  He says that some of his higher sugars are from getting some steroids from his urologist  However most of his high readings appear to be after dinner and occasionally midday also  Highest blood sugar recently is 202 and lowest blood sugar is 100 before dinner  Only recently has started back on being more active with little walking and yard work  He may have gained weight with prednisone and he thinks he is losing weight now  He has had difficulty with the Ozempic pen which he says sometimes will jam and not inject and has not been able to get replacements  Otherwise no nausea with this  Side effects from medications have been: None  Compliance with the medical regimen: Fair  Hypoglycemia: None    Glucose monitoring:  done 1-2 times a day         Glucometer:  [place] Blood Glucose readings by meter report  AVERAGE overall 141  PRE-meal average 109  After meal average 145      Self-care: The diet that the patient has been following is: tries to limit portions and fats .      Meals: 3 meals per day. Breakfast is sometimes cereal,sausage  Fruits for snacks  Exercise:  He is trying to do walking and yardwork  Dietician visit, most recent:?  2015 .  Weight history:    Wt Readings from Last 3 Encounters:  11/22/19 (!) 306 lb 12.8 oz (139.2 kg)  11/08/19 (!) 306 lb 12.8 oz (139.2 kg)  10/10/19 (!) 302 lb 3.2 oz (137.1 kg)    Glycemic control:   Lab Results  Component Value Date   HGBA1C 6.6 (H) 11/18/2019   HGBA1C 7.3 (H) 08/11/2019   HGBA1C 7.2 (H) 05/10/2019   Lab Results  Component Value Date   MICROALBUR 4.2 (H) 02/03/2019   LDLCALC 83 11/18/2019   CREATININE 1.12 11/18/2019   Lab Results  Component Value Date   HGB 12.2 (L) 10/08/2019     Office Visit on 11/22/2019  Component Date Value Ref Range Status  . Color, Urine 11/22/2019 YELLOW  Yellow;Lt. Yellow;Straw;Dark Yellow;Amber;Green;Red;Brown Final  .  APPearance 11/22/2019 Cloudy* Clear;Turbid;Slightly Cloudy;Cloudy Final  . Specific Gravity, Urine 11/22/2019 1.020  1.000 - 1.030 Final  . pH 11/22/2019 5.5  5.0 - 8.0 Final  . Total Protein, Urine 11/22/2019 30* Negative Final  . Urine Glucose 11/22/2019 >=1000* Negative Final  . Ketones, ur 11/22/2019 NEGATIVE  Negative Final  . Bilirubin Urine 11/22/2019 NEGATIVE  Negative Final  . Hgb urine dipstick 11/22/2019 LARGE* Negative Final  . Urobilinogen, UA 11/22/2019 0.2  0.0 - 1.0 Final  . Leukocytes,Ua 11/22/2019 SMALL* Negative Final  . Nitrite 11/22/2019 NEGATIVE  Negative Final  . WBC, UA 11/22/2019 TNTC(>50/hpf)* 0-2/hpf Final  . RBC / HPF 11/22/2019 7-10/hpf* 0-2/hpf Final  . Mucus, UA 11/22/2019 Presence of* None Final  . Squamous Epithelial / LPF 11/22/2019 Rare(0-4/hpf)  Rare(0-4/hpf) Final  . Bacteria, UA 11/22/2019 Few(10-50/hpf)* None Final  Lab on 11/18/2019  Component Date Value Ref Range Status  . Cholesterol 11/18/2019 144  0 - 200 mg/dL Final   ATP III Classification       Desirable:  < 200 mg/dL               Borderline High:  200 - 239 mg/dL          High:  > = 240 mg/dL  . Triglycerides 11/18/2019 117.0  0.0 - 149.0 mg/dL Final   Normal:  <150 mg/dLBorderline High:  150 - 199 mg/dL  . HDL 11/18/2019 37.30* >39.00 mg/dL Final  . VLDL 11/18/2019 23.4  0.0 - 40.0 mg/dL Final  . LDL Cholesterol 11/18/2019 83  0 - 99 mg/dL Final  . Total CHOL/HDL Ratio 11/18/2019 4   Final                  Men          Women1/2 Average Risk     3.4          3.3Average Risk          5.0          4.42X Average Risk          9.6          7.13X Average Risk          15.0          11.0                      . NonHDL 11/18/2019 106.44   Final   NOTE:  Non-HDL goal should be 30 mg/dL higher than patient's LDL goal (i.e. LDL goal of < 70 mg/dL, would have non-HDL goal of < 100 mg/dL)  . Sodium 11/18/2019 140  135 - 145 mEq/L Final  . Potassium 11/18/2019  4.8  3.5 - 5.1 mEq/L Final  . Chloride  11/18/2019 106  96 - 112 mEq/L Final  . CO2 11/18/2019 28  19 - 32 mEq/L Final  . Glucose, Bld 11/18/2019 109* 70 - 99 mg/dL Final  . BUN 11/18/2019 22  6 - 23 mg/dL Final  . Creatinine, Ser 11/18/2019 1.12  0.40 - 1.50 mg/dL Final  . Total Bilirubin 11/18/2019 0.6  0.2 - 1.2 mg/dL Final  . Alkaline Phosphatase 11/18/2019 83  39 - 117 U/L Final  . AST 11/18/2019 8  0 - 37 U/L Final  . ALT 11/18/2019 9  0 - 53 U/L Final  . Total Protein 11/18/2019 7.0  6.0 - 8.3 g/dL Final  . Albumin 11/18/2019 3.8  3.5 - 5.2 g/dL Final  . GFR 11/18/2019 65.37  >60.00 mL/min Final  . Calcium 11/18/2019 9.3  8.4 - 10.5 mg/dL Final  . Hgb A1c MFr Bld 11/18/2019 6.6* 4.6 - 6.5 % Final   Glycemic Control Guidelines for People with Diabetes:Non Diabetic:  <6%Goal of Therapy: <7%Additional Action Suggested:  >8%       Allergies as of 11/24/2019   No Known Allergies     Medication List       Accurate as of November 24, 2019  9:12 AM. If you have any questions, ask your nurse or doctor.        STOP taking these medications   Fish Oil 1000 MG Caps Stopped by: Elayne Snare, MD     TAKE these medications   amLODipine 2.5 MG tablet Commonly known as: NORVASC Take 1 tablet (2.5 mg total) by mouth daily.   atorvastatin 40 MG tablet Commonly known as: LIPITOR TAKE 1 TABLET BY MOUTH EVERY DAY   blood glucose meter kit and supplies Kit Test blood sugar daily as directed. Dx code: E11.9   CALCIUM PO Take 1,000 tablets by mouth daily.   CINNAMON PO Take 500 capsules by mouth daily.   diltiazem 30 MG tablet Commonly known as: Cardizem Take 1 Tablet Every 4 Hours As Needed For HR >100   doxycycline 100 MG tablet Commonly known as: VIBRA-TABS Take 1 tablet (100 mg total) by mouth 2 (two) times daily.   furosemide 20 MG tablet Commonly known as: LASIX TAKE 1 TABLET (20 MG TOTAL) BY MOUTH DAILY AS NEEDED FOR EDEMA.   glimepiride 1 MG tablet Commonly known as: AMARYL Take 1 tablet (1 mg total) by  mouth daily with breakfast.   glucose blood test strip Commonly known as: Accu-Chek Guide Use as instructed to check blood sugar twice daily.   Invokamet XR [phone] MG Tb24 Generic drug: Canagliflozin-metFORMIN HCl ER Take 2 tablets by mouth daily.   leuprolide 11.25 MG injection Commonly known as: LUPRON Inject 11.25 mg into the muscle every 6 (six) months.   losartan 50 MG tablet Commonly known as: COZAAR TAKE 1 TABLET BY MOUTH EVERY DAY   metoprolol tartrate 100 MG tablet Commonly known as: LOPRESSOR TAKE 1 TABLET BY MOUTH TWICE A DAY   Ozempic (1 MG/DOSE) 2 MG/1.5ML Sopn Generic drug: Semaglutide (1 MG/DOSE) INJECT '1MG'$  UNDER THE SKIN ONCE WEEKLY. What changed: See the new instructions.   pantoprazole 40 MG tablet Commonly known as: PROTONIX Take 1 tablet (40 mg total) by mouth daily.   rivaroxaban 20 MG Tabs tablet Commonly known as: XARELTO Take 1 tablet (20 mg total) by mouth daily before breakfast.   tamsulosin 0.4 MG Caps capsule Commonly known as: FLOMAX Take 1 capsule (0.4 mg  total) by mouth daily. What changed: when to take this   Vitamin D (Cholecalciferol) 10 MCG (400 UNIT) Tabs Take 400 Units by mouth daily.       Allergies:  No Known Allergies  Past Medical History:  Diagnosis Date  . Arthritis   . Atrial fibrillation ([place])   . Diabetes mellitus without complication ([place])    type 2  . DVT (deep venous thrombosis) ([place]) 2015   left leg.  . Foley catheter in place last changed 2eeks ago   last 7 months  . GERD (gastroesophageal reflux disease)   . Hx of radiation therapy   . Hypercholesterolemia   . Hypertension   . Prostate cancer ([place]) 12/06/13   gleason 4+3=7, 6/12 cores positive. seed implant and radiation  . Sleep apnea    no cpap used  . Urinary retention     Past Surgical History:  Procedure Laterality Date  . APPENDECTOMY    . BALLOON DILATION N/A 08/02/2015   Procedure: BALLOON DILATION;  Surgeon: [name], MD;   Location: [facility];  Service: Endoscopy;  Laterality: N/A;  . CARDIOVERSION N/A 05/13/2018   Procedure: CARDIOVERSION;  Surgeon: Sanda Klein, MD;  Location: [facility];  Service: Cardiovascular;  Laterality: N/A;  . CARDIOVERSION N/A 05/18/2018   Procedure: CARDIOVERSION;  Surgeon: Buford Dresser, MD;  Location: [facility];  Service: Cardiovascular;  Laterality: N/A;  . COLONOSCOPY WITH PROPOFOL N/A 08/02/2015   Procedure: COLONOSCOPY WITH PROPOFOL w/ APC;  Surgeon: [name], MD;  Location: [facility];  Service: Endoscopy;  Laterality: N/A;  . ESOPHAGOGASTRODUODENOSCOPY (EGD) WITH PROPOFOL N/A 08/02/2015   Procedure: ESOPHAGOGASTRODUODENOSCOPY (EGD) WITH PROPOFOL/ possible dilation.;  Surgeon: [name], MD;  Location: [facility];  Service: Endoscopy;  Laterality: N/A;  . KNEE SURGERY     right knee orthoscopic  . PROSTATE BIOPSY  12/06/13   Gleason 4+3=7, volume 35 gm  . [place]   Tourette'ssyndrome spinal fluid removed   . TONSILLECTOMY  as child  . TRANSURETHRAL RESECTION OF PROSTATE N/A 09/30/2019   Procedure: TRANSURETHRAL RESECTION OF THE PROSTATE (TURP);  Surgeon: Alexis Frock, MD;  Location: [facility];  Service: Urology;  Laterality: N/A;  1 HR    Family History  Problem Relation Age of Onset  . Heart disease Mother        before age 55  . Diabetes Mother   . Hyperlipidemia Mother   . Varicose Veins Mother   . Hypertension Mother   . Heart disease Father   . Diabetes Father   . Hyperlipidemia Father   . Hypertension Father   . Cancer Brother   . COPD Brother   . Diabetes Brother   . Hyperlipidemia Brother   . Hypertension Brother     Social History:  reports that he quit smoking about 7 years ago. His smoking use included cigarettes. He has a 11.50 pack-year smoking history. He has never used smokeless tobacco. He reports current alcohol use. He reports that he does not use drugs.    Review of  Systems        Lipids: he has had significant hyperlipidemia and has been treated with Lipitor 40 mg daily Previously had been taking Vascepa for triglycerides for some time but he felt that it would cause atrial fibrillation an hour after taking the medicine in the evening and he has stopped this  Has no history of CAD His diet is usually low in fat  Continues  to have low HDL Triglycerides are still consistently normal despite stopping Vascepa LDL has been very consistent below 100        Lab Results  Component Value Date   CHOL 144 11/18/2019   CHOL 144 02/03/2019   CHOL 169 04/28/2018   Lab Results  Component Value Date   HDL 37.30 (L) 11/18/2019   HDL 34.10 (L) 02/03/2019   HDL 32.20 (L) 04/28/2018   Lab Results  Component Value Date   LDLCALC 83 11/18/2019   LDLCALC 84 02/03/2019   LDLCALC 104 (H) 04/28/2018   Lab Results  Component Value Date   TRIG 117.0 11/18/2019   TRIG 128.0 02/03/2019   TRIG 166.0 (H) 04/28/2018   Lab Results  Component Value Date   CHOLHDL 4 11/18/2019   CHOLHDL 4 02/03/2019   CHOLHDL 5 04/28/2018   Lab Results  Component Value Date   LDLDIRECT 93.0 12/30/2016   LDLDIRECT 103.0 02/08/2015                   The blood pressure has been treated with losartan 25 mg and low-dose metoprolol, followed by PCP and cardiologist    BP Readings from Last 3 Encounters:  11/22/19 132/70  11/08/19 120/68  10/10/19 112/62    RENAL function is back to normal, was higher when he had obstructive uropathy  Lab Results  Component Value Date   CREATININE 1.12 11/18/2019   CREATININE 1.83 (H) 10/10/2019   CREATININE 1.32 (H) 10/08/2019    Followed by cardiologist for atrial fibrillation   LABS:  Office Visit on 11/22/2019  Component Date Value Ref Range Status  . Color, Urine 11/22/2019 YELLOW  Yellow;Lt. Yellow;Straw;Dark Yellow;Amber;Green;Red;Brown Final  . APPearance 11/22/2019 Cloudy* Clear;Turbid;Slightly Cloudy;Cloudy Final    . Specific Gravity, Urine 11/22/2019 1.020  1.000 - 1.030 Final  . pH 11/22/2019 5.5  5.0 - 8.0 Final  . Total Protein, Urine 11/22/2019 30* Negative Final  . Urine Glucose 11/22/2019 >=1000* Negative Final  . Ketones, ur 11/22/2019 NEGATIVE  Negative Final  . Bilirubin Urine 11/22/2019 NEGATIVE  Negative Final  . Hgb urine dipstick 11/22/2019 LARGE* Negative Final  . Urobilinogen, UA 11/22/2019 0.2  0.0 - 1.0 Final  . Leukocytes,Ua 11/22/2019 SMALL* Negative Final  . Nitrite 11/22/2019 NEGATIVE  Negative Final  . WBC, UA 11/22/2019 TNTC(>50/hpf)* 0-2/hpf Final  . RBC / HPF 11/22/2019 7-10/hpf* 0-2/hpf Final  . Mucus, UA 11/22/2019 Presence of* None Final  . Squamous Epithelial / LPF 11/22/2019 Rare(0-4/hpf)  Rare(0-4/hpf) Final  . Bacteria, UA 11/22/2019 Few(10-50/hpf)* None Final  Lab on 11/18/2019  Component Date Value Ref Range Status  . Cholesterol 11/18/2019 144  0 - 200 mg/dL Final   ATP III Classification       Desirable:  < 200 mg/dL               Borderline High:  200 - 239 mg/dL          High:  > = 240 mg/dL  . Triglycerides 11/18/2019 117.0  0.0 - 149.0 mg/dL Final   Normal:  <150 mg/dLBorderline High:  150 - 199 mg/dL  . HDL 11/18/2019 37.30* >39.00 mg/dL Final  . VLDL 11/18/2019 23.4  0.0 - 40.0 mg/dL Final  . LDL Cholesterol 11/18/2019 83  0 - 99 mg/dL Final  . Total CHOL/HDL Ratio 11/18/2019 4   Final                  Men  Women1/2 Average Risk     3.4          3.3Average Risk          5.0          4.42X Average Risk          9.6          7.13X Average Risk          15.0          11.0                      . NonHDL 11/18/2019 106.44   Final   NOTE:  Non-HDL goal should be 30 mg/dL higher than patient's LDL goal (i.e. LDL goal of < 70 mg/dL, would have non-HDL goal of < 100 mg/dL)  . Sodium 11/18/2019 140  135 - 145 mEq/L Final  . Potassium 11/18/2019 4.8  3.5 - 5.1 mEq/L Final  . Chloride 11/18/2019 106  96 - 112 mEq/L Final  . CO2 11/18/2019 28  19 - 32 mEq/L  Final  . Glucose, Bld 11/18/2019 109* 70 - 99 mg/dL Final  . BUN 11/18/2019 22  6 - 23 mg/dL Final  . Creatinine, Ser 11/18/2019 1.12  0.40 - 1.50 mg/dL Final  . Total Bilirubin 11/18/2019 0.6  0.2 - 1.2 mg/dL Final  . Alkaline Phosphatase 11/18/2019 83  39 - 117 U/L Final  . AST 11/18/2019 8  0 - 37 U/L Final  . ALT 11/18/2019 9  0 - 53 U/L Final  . Total Protein 11/18/2019 7.0  6.0 - 8.3 g/dL Final  . Albumin 11/18/2019 3.8  3.5 - 5.2 g/dL Final  . GFR 11/18/2019 65.37  >60.00 mL/min Final  . Calcium 11/18/2019 9.3  8.4 - 10.5 mg/dL Final  . Hgb A1c MFr Bld 11/18/2019 6.6* 4.6 - 6.5 % Final   Glycemic Control Guidelines for People with Diabetes:Non Diabetic:  <6%Goal of Therapy: <7%Additional Action Suggested:  >8%     Physical Examination:  There were no vitals taken for this visit.  Diabetes type 2, morbidly obese, non-insulin-dependent   See history of present illness for detailed discussion of his current management, blood sugar patterns and problems identified   His A1c is improved at 6.6 He has done well with going back on Invokana  Also recently starting to lose weight again Diet has been good and he feels that his high sugars are only when he gets any steroids However may not have an accurate meter currently and may benefit from going back to the One [employer] Verio  HYPERTENSION: Recently well controlled  Renal dysfunction: He had a high creatinine but this was likely related to bladder outlet obstruction and is now back to normal  Mixed dyslipidemia: Well controlled and triglycerides are not higher despite stopping Vascepa which he feels was causing atrial fibrillation      PLAN:  He will try to call his insurance company to get a more accurate glucose meter No change in medication He will try the new Ozempic pen that is going to be available next month and if he still has difficulties with the pain not working he will let us know Continue rotating his testing times  and let us know if he has consistently high readings Encouraged him to continue increasing his activity level  Continue Lipitor 40 mg for lipids  There are no Patient Instructions on file for this visit.      Elayne Snare 11/24/2019,  9:12 AM   Note: This office note was prepared with Estate agent. Any transcriptional errors that result from this process are unintentional.

## 2019-11-27 ENCOUNTER — Encounter: Payer: Self-pay | Admitting: Internal Medicine

## 2019-11-27 NOTE — Assessment & Plan Note (Addendum)
Likely recurrent, for doxy restart, check urine cx  I spent 31 minutes in preparing to see the patient by review of recent labs, imaging and procedures, obtaining and reviewing separately obtained history, communicating with the patient and family or caregiver, ordering medications, tests or procedures, and documenting clinical information in the EHR including the differential Dx, treatment, and any further evaluation and other management of recurrent uTI, HTN, HLD, DM

## 2019-11-27 NOTE — Assessment & Plan Note (Signed)
stable overall by history and exam, recent data reviewed with pt, and pt to continue medical treatment as before,  to f/u any worsening symptoms or concerns  

## 2019-12-03 ENCOUNTER — Other Ambulatory Visit (HOSPITAL_COMMUNITY): Payer: Self-pay | Admitting: Nurse Practitioner

## 2019-12-06 DIAGNOSIS — Z5111 Encounter for antineoplastic chemotherapy: Secondary | ICD-10-CM | POA: Diagnosis not present

## 2019-12-06 DIAGNOSIS — C61 Malignant neoplasm of prostate: Secondary | ICD-10-CM | POA: Diagnosis not present

## 2019-12-10 ENCOUNTER — Other Ambulatory Visit: Payer: Self-pay | Admitting: Endocrinology

## 2019-12-15 ENCOUNTER — Other Ambulatory Visit: Payer: Self-pay | Admitting: Internal Medicine

## 2019-12-29 ENCOUNTER — Other Ambulatory Visit: Payer: Self-pay

## 2019-12-29 MED ORDER — GLIMEPIRIDE 1 MG PO TABS: 1.0000 mg | ORAL_TABLET | Freq: Every day | ORAL | 1 refills | Status: DC

## 2019-12-29 MED ORDER — INVOKAMET XR 150-1000 MG PO TB24: 2.0000 | ORAL_TABLET | Freq: Every day | ORAL | 1 refills | Status: DC

## 2019-12-31 ENCOUNTER — Other Ambulatory Visit (HOSPITAL_COMMUNITY): Payer: Self-pay | Admitting: Nurse Practitioner

## 2020-01-03 DIAGNOSIS — C61 Malignant neoplasm of prostate: Secondary | ICD-10-CM | POA: Diagnosis not present

## 2020-01-09 DIAGNOSIS — R338 Other retention of urine: Secondary | ICD-10-CM | POA: Diagnosis not present

## 2020-01-09 DIAGNOSIS — C61 Malignant neoplasm of prostate: Secondary | ICD-10-CM | POA: Diagnosis not present

## 2020-01-09 DIAGNOSIS — N200 Calculus of kidney: Secondary | ICD-10-CM | POA: Diagnosis not present

## 2020-01-27 ENCOUNTER — Other Ambulatory Visit (HOSPITAL_COMMUNITY): Payer: Self-pay | Admitting: Nurse Practitioner

## 2020-02-03 ENCOUNTER — Other Ambulatory Visit (HOSPITAL_COMMUNITY): Payer: Self-pay | Admitting: Nurse Practitioner

## 2020-02-24 ENCOUNTER — Other Ambulatory Visit: Payer: Self-pay

## 2020-02-24 ENCOUNTER — Other Ambulatory Visit (INDEPENDENT_AMBULATORY_CARE_PROVIDER_SITE_OTHER): Payer: BC Managed Care – PPO

## 2020-02-24 DIAGNOSIS — E1165 Type 2 diabetes mellitus with hyperglycemia: Secondary | ICD-10-CM | POA: Diagnosis not present

## 2020-02-24 LAB — BASIC METABOLIC PANEL
BUN: 25 mg/dL — ABNORMAL HIGH (ref 6–23)
CO2: 27 mEq/L (ref 19–32)
Calcium: 10 mg/dL (ref 8.4–10.5)
Chloride: 105 mEq/L (ref 96–112)
Creatinine, Ser: 1.15 mg/dL (ref 0.40–1.50)
GFR: 63.35 mL/min (ref >60.00)
Glucose, Bld: 102 mg/dL — ABNORMAL HIGH (ref 70–99)
Potassium: 4.8 mEq/L (ref 3.5–5.1)
Sodium: 141 mEq/L (ref 135–145)

## 2020-02-24 LAB — HEMOGLOBIN A1C: Hgb A1c MFr Bld: 7.2 % — ABNORMAL HIGH (ref 4.6–6.5)

## 2020-02-24 LAB — MICROALBUMIN / CREATININE URINE RATIO
Creatinine,U: 59 mg/dL
Microalb Creat Ratio: 9 mg/g (ref 0.0–30.0)
Microalb, Ur: 5.3 mg/dL — ABNORMAL HIGH (ref 0.0–1.9)

## 2020-02-29 ENCOUNTER — Other Ambulatory Visit: Payer: Self-pay

## 2020-02-29 ENCOUNTER — Ambulatory Visit (INDEPENDENT_AMBULATORY_CARE_PROVIDER_SITE_OTHER): Payer: BC Managed Care – PPO | Admitting: Endocrinology

## 2020-02-29 ENCOUNTER — Encounter: Payer: Self-pay | Admitting: Endocrinology

## 2020-02-29 VITALS — BP 108/64 | HR 87 | Ht 71.0 in | Wt 309.4 lb

## 2020-02-29 DIAGNOSIS — E1165 Type 2 diabetes mellitus with hyperglycemia: Secondary | ICD-10-CM

## 2020-02-29 DIAGNOSIS — E782 Mixed hyperlipidemia: Secondary | ICD-10-CM | POA: Diagnosis not present

## 2020-02-29 NOTE — Progress Notes (Signed)
Patient ID: Tommy Yang, male   DOB: 09-02-1950, 69 y.o.   MRN: [id number]           Reason for Appointment: Follow-up for Type 2 Diabetes    History of Present Illness:          Diagnosis: Type 2 diabetes mellitus, date of diagnosis: 05/2013       Past history:   He apparently had significant hyperglycemia in 2014 when seen in the urgent care center but did not establish with a PCP for control. He was started on treatment for his diabetes in 10/2013 and he was initially treated with metformin and Januvia A few months later he was also given glipizide ER to help his control when his A1c had gone up to 10.9 He says he was also seen by dietitian but no record available of this. Subsequently his blood sugar control had improved with A1c down to 6.8 in 9/15 On his consultation in 3/16 he had high blood sugars and weight gain; A1c was 9.1% with taking glipizide ER, Januvia and metformin.  Recent history:    Non-insulin hypoglycemic drugs the patient is taking are: Amaryl 1 mg at dinner, Invokamet XR 150/1000, 2 tablets daily, Ozempic 1 mg weekly  His A1c has gone up to 7.2, was 6.6    Current blood sugar patterns and problems identified:  He says he does not have as good of control because of increasing fatigue for about 6 weeks after his Lupron injection and lack of physical activity  He is usually trying to monitor his diet and portions  No use of steroids recently  Checking blood sugars again with the insurance company sponsored meter which previously was reading a few falsely high readings also  Again occasionally will have difficulty with his Ozempic pen which will not work at times  Only rarely has a blood sugar over 200 and he thinks it may go up with exercise like brisk walking  Otherwise blood sugars are fairly close to normal  Side effects from medications have been: None  Compliance with the medical regimen: Fair  Hypoglycemia: None    Glucose monitoring:   done 1-2 times a day         Glucometer:  [place] Blood Glucose readings by meter report, analysis not very clear and meter readings are not sorted by time of day  AVERAGE for 30 days 136, previously 141  PRE-meal average 106  After meal average 141      Self-care: The diet that the patient has been following is: tries to limit portions and fats .      Meals: 3 meals per day. Breakfast is sometimes cereal,sausage  Fruits for snacks  Exercise:  He is trying to do walking and yardwork  Dietician visit, most recent:?  2015 .               Weight history:    Wt Readings from Last 3 Encounters:  02/29/20 (!) 309 lb 6.4 oz (140.3 kg)  11/22/19 (!) 306 lb 12.8 oz (139.2 kg)  11/08/19 (!) 306 lb 12.8 oz (139.2 kg)    Glycemic control:   Lab Results  Component Value Date   HGBA1C 7.2 (H) 02/24/2020   HGBA1C 6.6 (H) 11/18/2019   HGBA1C 7.3 (H) 08/11/2019   Lab Results  Component Value Date   MICROALBUR 5.3 (H) 02/24/2020   LDLCALC 83 11/18/2019   CREATININE 1.15 02/24/2020   Lab Results  Component Value Date   HGB  12.2 (L) 10/08/2019     Lab on 02/24/2020  Component Date Value Ref Range Status  . Microalb, Ur 02/24/2020 5.3* 0.0 - 1.9 mg/dL Final  . Creatinine,U 02/24/2020 59.0  mg/dL Final  . Microalb Creat Ratio 02/24/2020 9.0  0.0 - 30.0 mg/g Final  . Sodium 02/24/2020 141  135 - 145 mEq/L Final  . Potassium 02/24/2020 4.8  3.5 - 5.1 mEq/L Final  . Chloride 02/24/2020 105  96 - 112 mEq/L Final  . CO2 02/24/2020 27  19 - 32 mEq/L Final  . Glucose, Bld 02/24/2020 102* 70 - 99 mg/dL Final  . BUN 02/24/2020 25* 6 - 23 mg/dL Final  . Creatinine, Ser 02/24/2020 1.15  0.40 - 1.50 mg/dL Final  . GFR 02/24/2020 63.35  >60.00 mL/min Final  . Calcium 02/24/2020 10.0  8.4 - 10.5 mg/dL Final  . Hgb A1c MFr Bld 02/24/2020 7.2* 4.6 - 6.5 % Final   Glycemic Control Guidelines for People with Diabetes:Non Diabetic:  <6%Goal of Therapy: <7%Additional Action Suggested:  >8%        Allergies as of 02/29/2020   No Known Allergies     Medication List       Accurate as of February 29, 2020  9:19 AM. If you have any questions, ask your nurse or doctor.        amLODipine 2.5 MG tablet Commonly known as: NORVASC Take 1 tablet (2.5 mg total) by mouth daily.   atorvastatin 40 MG tablet Commonly known as: LIPITOR TAKE 1 TABLET BY MOUTH EVERY DAY   blood glucose meter kit and supplies Kit Test blood sugar daily as directed. Dx code: E11.9   CALCIUM PO Take 1,000 tablets by mouth daily.   CINNAMON PO Take 500 capsules by mouth daily.   diltiazem 30 MG tablet Commonly known as: CARDIZEM TAKE 1 TABLET EVERY 4 HOURS AS NEEDED FOR HR >100   doxycycline 100 MG tablet Commonly known as: VIBRA-TABS Take 1 tablet (100 mg total) by mouth 2 (two) times daily.   furosemide 20 MG tablet Commonly known as: LASIX TAKE 1 TABLET (20 MG TOTAL) BY MOUTH DAILY AS NEEDED FOR EDEMA.   glimepiride 1 MG tablet Commonly known as: AMARYL Take 1 tablet (1 mg total) by mouth daily with breakfast.   glucose blood test strip Commonly known as: Accu-Chek Guide Use as instructed to check blood sugar twice daily.   Invokamet XR [phone] MG Tb24 Generic drug: Canagliflozin-metFORMIN HCl ER Take 2 tablets by mouth daily.   leuprolide 11.25 MG injection Commonly known as: LUPRON Inject 11.25 mg into the muscle every 6 (six) months.   losartan 50 MG tablet Commonly known as: COZAAR TAKE 1 TABLET BY MOUTH EVERY DAY   metoprolol tartrate 100 MG tablet Commonly known as: LOPRESSOR TAKE 1 TABLET BY MOUTH TWICE A DAY   Ozempic (1 MG/DOSE) 2 MG/1.5ML Sopn Generic drug: Semaglutide (1 MG/DOSE) Inject 1 mg into the skin once a week. fridays   pantoprazole 40 MG tablet Commonly known as: PROTONIX Take 1 tablet (40 mg total) by mouth daily.   rivaroxaban 20 MG Tabs tablet Commonly known as: XARELTO Take 1 tablet (20 mg total) by mouth daily before breakfast.    tamsulosin 0.4 MG Caps capsule Commonly known as: FLOMAX Take 1 capsule (0.4 mg total) by mouth daily. What changed: when to take this   Vitamin D (Cholecalciferol) 10 MCG (400 UNIT) Tabs Take 400 Units by mouth daily.       Allergies:  No Known Allergies  Past Medical History:  Diagnosis Date  . Arthritis   . Atrial fibrillation ([place])   . Diabetes mellitus without complication ([place])    type 2  . DVT (deep venous thrombosis) ([place]) 2015   left leg.  . Foley catheter in place last changed 2eeks ago   last 7 months  . GERD (gastroesophageal reflux disease)   . Hx of radiation therapy   . Hypercholesterolemia   . Hypertension   . Prostate cancer ([place]) 12/06/13   gleason 4+3=7, 6/12 cores positive. seed implant and radiation  . Sleep apnea    no cpap used  . Urinary retention     Past Surgical History:  Procedure Laterality Date  . APPENDECTOMY    . BALLOON DILATION N/A 08/02/2015   Procedure: BALLOON DILATION;  Surgeon: [name], MD;  Location: [facility];  Service: Endoscopy;  Laterality: N/A;  . CARDIOVERSION N/A 05/13/2018   Procedure: CARDIOVERSION;  Surgeon: Sanda Klein, MD;  Location: [facility];  Service: Cardiovascular;  Laterality: N/A;  . CARDIOVERSION N/A 05/18/2018   Procedure: CARDIOVERSION;  Surgeon: Buford Dresser, MD;  Location: [facility];  Service: Cardiovascular;  Laterality: N/A;  . COLONOSCOPY WITH PROPOFOL N/A 08/02/2015   Procedure: COLONOSCOPY WITH PROPOFOL w/ APC;  Surgeon: [name], MD;  Location: [facility];  Service: Endoscopy;  Laterality: N/A;  . ESOPHAGOGASTRODUODENOSCOPY (EGD) WITH PROPOFOL N/A 08/02/2015   Procedure: ESOPHAGOGASTRODUODENOSCOPY (EGD) WITH PROPOFOL/ possible dilation.;  Surgeon: [name], MD;  Location: [facility];  Service: Endoscopy;  Laterality: N/A;  . KNEE SURGERY     right knee orthoscopic  . PROSTATE BIOPSY  12/06/13   Gleason 4+3=7, volume 35 gm  . [place]    Tourette'ssyndrome spinal fluid removed   . TONSILLECTOMY  as child  . TRANSURETHRAL RESECTION OF PROSTATE N/A 09/30/2019   Procedure: TRANSURETHRAL RESECTION OF THE PROSTATE (TURP);  Surgeon: Alexis Frock, MD;  [facility];  Service: Urology;  Laterality: N/A;  1 HR    Family History  Problem Relation Age of Onset  . Heart disease Mother        before age 42  . Diabetes Mother   . Hyperlipidemia Mother   . Varicose Veins Mother   . Hypertension Mother   . Heart disease Father   . Diabetes Father   . Hyperlipidemia Father   . Hypertension Father   . Cancer Brother   . COPD Brother   . Diabetes Brother   . Hyperlipidemia Brother   . Hypertension Brother     Social History:  reports that he quit smoking about 7 years ago. His smoking use included cigarettes. He has a 11.50 pack-year smoking history. He has never used smokeless tobacco. He reports current alcohol use. He reports that he does not use drugs.    Review of Systems        HYPERLIPIDEMIA: he has had significant hyperlipidemia and has been treated with Lipitor 40 mg daily Previously had been taking Vascepa for triglycerides but he felt that it would cause atrial fibrillation an hour after taking the medicine in the evening and he stopped this  Has no history of CAD His diet is usually low in fat  Continues to have low HDL Triglycerides are consistently normal despite stopping Vascepa LDL has been very consistent below 100        Lab Results  Component Value Date   CHOL 144 11/18/2019   CHOL  144 02/03/2019   CHOL 169 04/28/2018   Lab Results  Component Value Date   HDL 37.30 (L) 11/18/2019   HDL 34.10 (L) 02/03/2019   HDL 32.20 (L) 04/28/2018   Lab Results  Component Value Date   LDLCALC 83 11/18/2019   LDLCALC 84 02/03/2019   LDLCALC 104 (H) 04/28/2018   Lab Results  Component Value Date   TRIG 117.0 11/18/2019   TRIG 128.0 02/03/2019   TRIG 166.0 (H) 04/28/2018    Lab Results  Component Value Date   CHOLHDL 4 11/18/2019   CHOLHDL 4 02/03/2019   CHOLHDL 5 04/28/2018   Lab Results  Component Value Date   LDLDIRECT 93.0 12/30/2016   LDLDIRECT 103.0 02/08/2015                HYPERTENSION: Mild and has been treated with losartan 25 mg and low-dose metoprolol, followed by PCP and cardiologist   BP Readings from Last 3 Encounters:  02/29/20 108/64  11/22/19 132/70  11/08/19 120/68    RENAL function is back to normal, was higher when he had obstructive uropathy  Lab Results  Component Value Date   CREATININE 1.15 02/24/2020   CREATININE 1.12 11/18/2019   CREATININE 1.83 (H) 10/10/2019    Followed by cardiologist for atrial fibrillation   LABS:  Lab on 02/24/2020  Component Date Value Ref Range Status  . Microalb, Ur 02/24/2020 5.3* 0.0 - 1.9 mg/dL Final  . Creatinine,U 02/24/2020 59.0  mg/dL Final  . Microalb Creat Ratio 02/24/2020 9.0  0.0 - 30.0 mg/g Final  . Sodium 02/24/2020 141  135 - 145 mEq/L Final  . Potassium 02/24/2020 4.8  3.5 - 5.1 mEq/L Final  . Chloride 02/24/2020 105  96 - 112 mEq/L Final  . CO2 02/24/2020 27  19 - 32 mEq/L Final  . Glucose, Bld 02/24/2020 102* 70 - 99 mg/dL Final  . BUN 02/24/2020 25* 6 - 23 mg/dL Final  . Creatinine, Ser 02/24/2020 1.15  0.40 - 1.50 mg/dL Final  . GFR 02/24/2020 63.35  >60.00 mL/min Final  . Calcium 02/24/2020 10.0  8.4 - 10.5 mg/dL Final  . Hgb A1c MFr Bld 02/24/2020 7.2* 4.6 - 6.5 % Final   Glycemic Control Guidelines for People with Diabetes:Non Diabetic:  <6%Goal of Therapy: <7%Additional Action Suggested:  >8%     Physical Examination:  BP 108/64 (BP Location: Left Arm, Patient Position: Sitting)   Pulse 87   Ht '5\' 11"'$  (1.803 m)   Wt (!) 309 lb 6.4 oz (140.3 kg)   SpO2 94%   BMI 43.15 kg/m   Diabetes type 2, morbidly obese, non-insulin-dependent   See history of present illness for detailed discussion of his current management, blood sugar patterns and problems  identified   His A1c is higher at 7.2 as discussed above  Currently on 1 mg Ozempic, Invokamet and low-dose Amaryl Continues to have difficulty losing weight especially when he is not able to do much physical activity after his Lupron injections  HYPERTENSION: well controlled      PLAN:  He will check his blood sugars by rotation at different time Restart exercise when able to He will take out his Ozempic pen for injection at least a day before and keep it at room temperature instead of refrigerating it all the time  There are no Patient Instructions on file for this visit.      Elayne Snare 02/29/2020, 9:19 AM   Note: This office note was prepared with Dragon voice recognition  system technology. Any transcriptional errors that result from this process are unintentional.

## 2020-03-04 ENCOUNTER — Other Ambulatory Visit: Payer: Self-pay | Admitting: Internal Medicine

## 2020-03-04 NOTE — Telephone Encounter (Signed)
Please refill as per office routine med refill policy (all routine meds refilled for 3 mo or monthly per pt preference up to one year from last visit, then month to month grace period for 3 mo, then further med refills will have to be denied)  

## 2020-03-07 ENCOUNTER — Other Ambulatory Visit: Payer: Self-pay

## 2020-03-07 ENCOUNTER — Encounter (HOSPITAL_COMMUNITY): Payer: Self-pay | Admitting: Nurse Practitioner

## 2020-03-07 ENCOUNTER — Ambulatory Visit (HOSPITAL_COMMUNITY)
Admission: RE | Admit: 2020-03-07 | Discharge: 2020-03-07 | Disposition: A | Discharge status: Home / Self Care | Payer: BC Managed Care – PPO | Source: Ambulatory Visit | Attending: Nurse Practitioner | Admitting: Nurse Practitioner

## 2020-03-07 VITALS — BP 94/56 | HR 73 | Ht 71.0 in | Wt 311.6 lb

## 2020-03-07 DIAGNOSIS — I4821 Permanent atrial fibrillation: Secondary | ICD-10-CM

## 2020-03-07 DIAGNOSIS — K219 Gastro-esophageal reflux disease without esophagitis: Secondary | ICD-10-CM | POA: Insufficient documentation

## 2020-03-07 DIAGNOSIS — I82402 Acute embolism and thrombosis of unspecified deep veins of left lower extremity: Secondary | ICD-10-CM | POA: Diagnosis not present

## 2020-03-07 DIAGNOSIS — Z79899 Other long term (current) drug therapy: Secondary | ICD-10-CM | POA: Insufficient documentation

## 2020-03-07 DIAGNOSIS — Z7901 Long term (current) use of anticoagulants: Secondary | ICD-10-CM | POA: Insufficient documentation

## 2020-03-07 DIAGNOSIS — D6869 Other thrombophilia: Secondary | ICD-10-CM

## 2020-03-07 DIAGNOSIS — R0683 Snoring: Secondary | ICD-10-CM | POA: Diagnosis not present

## 2020-03-07 DIAGNOSIS — Z960 Presence of urogenital implants: Secondary | ICD-10-CM | POA: Diagnosis not present

## 2020-03-07 DIAGNOSIS — E119 Type 2 diabetes mellitus without complications: Secondary | ICD-10-CM | POA: Diagnosis not present

## 2020-03-07 DIAGNOSIS — Z8546 Personal history of malignant neoplasm of prostate: Secondary | ICD-10-CM | POA: Insufficient documentation

## 2020-03-07 DIAGNOSIS — Z7984 Long term (current) use of oral hypoglycemic drugs: Secondary | ICD-10-CM | POA: Insufficient documentation

## 2020-03-07 DIAGNOSIS — I4811 Longstanding persistent atrial fibrillation: Secondary | ICD-10-CM | POA: Diagnosis not present

## 2020-03-07 DIAGNOSIS — Z825 Family history of asthma and other chronic lower respiratory diseases: Secondary | ICD-10-CM | POA: Diagnosis not present

## 2020-03-07 DIAGNOSIS — Z8249 Family history of ischemic heart disease and other diseases of the circulatory system: Secondary | ICD-10-CM | POA: Diagnosis not present

## 2020-03-07 DIAGNOSIS — Z87891 Personal history of nicotine dependence: Secondary | ICD-10-CM | POA: Insufficient documentation

## 2020-03-07 NOTE — Progress Notes (Addendum)
Date:  03/07/2020   ID:  Tommy Yang, DOB 11/24/1950, MRN [id number]  Location: home   Provider location: [address] [place], Tommy Yang [address] Evaluation Performed:Follow up   PCP:  [name], MD  Primary Cardiologist:  None  Primary Electrophysiologist: none   LP:NPYYFR fibrillation   History of Present Illness: Tommy Yang is a 69 y.o. male who presents for f/u today. He has longstanding persistent afib. He feels he may have had afib for 2 years befor he was diagnosed.  On previous visits, he has been offered AAD therapy which he has deferred as he feels he is minimally symptomatic. He does have some snoring. Possible apnea, but  pt states he would not be able to deal with a mask to treat sleep apnea. He scratched  his lower leg and being diabetic, has an appointment with the wound center to make sure it is healing.  6 month f/u in afib clinic, 09/03/19. He is in rate controlled afib. He reports going to ER  In September after the flu shot with afib with RVR. He was rate controlled with IV Cardizem and discharged home on same meds. He is having issues now with urinary retention with h/o prostate CA. He is wearing a urinary catheter. He is due to go back to urologist tomorrow for removal but if he can not urinate , he was told he will have to have surgery. He is having intermittent hematuria now. His prior leg wound did heal. Continues on xarelto with a CHA2DS2VASc score of 5.he is on tumeric and fish oil which may be contributing to hematuria.  In afib clinic, 10/05/19. He had post chanel TURP on 09/30/19 and had to go back to ER yesterday  to have foley reinserted after not being able to void since it was removed earlier that am. Urine had clots in the ER but was irrigated to pink color and remains that way today. He is back on xarelto. Pt asked to be seen today after increase in HR into the 120-130's and has noted swelling in his lower extremities. He does not describe   any PND/orthopnea. He feels nervous with the increase of HR and short of breath with exertion.Tommy Yang HIs overall weight is down one lb today but his LE's are swollen.  F/u in afib clinic, 10/10/19. He feels better with lasix and addition of Cardizem to slow HR. He took lasix x 5 days and now his weight is back down to his normal. He will stay on Cardizem. Bmet drawn today.He continues with foley cath with pinkish urine and sees urologist tomorrow.   F/u 11/08/19. His catheter has been removed and he is doing much better with voiding. No recent hematuria. His heart rate is now controlled afib back in the  80's and he stopped taking the diltiazem daily and went back on amlodipine. BP well controlled.   F/u 03/07/20, he is in permanent afib, well rate controlled and asymptoatmic. He c/o of being tired that increase with his 6 month lupron shot. His BP is soft today and was also last week with another provider. We discussed reducing some of his BP meds. He went to [facility] last month and had a good time. Minimal symptoms walking. Urination I s normla now and has not had any further urinary retention/hematuria issues.   Today, he denies symptoms of palpitations, chest pain, shortness of breath, orthopnea, PND, lower extremity edema, claudication, dizziness, presyncope, syncope, bleeding, or neurologic  sequela. The patient is tolerating medications without difficulties and is otherwise without complaint today.   he denies symptoms of cough, fevers, chills, or new SOB worrisome for COVID 19.    Filed Weights   03/07/20 0828  Weight: (!) 141.3 kg    Past Medical History:  Diagnosis Date  . Arthritis   . Atrial fibrillation ([place])   . Diabetes mellitus without complication ([place])    type 2  . DVT (deep venous thrombosis) ([place]) 2015   left leg.  . Foley catheter in place last changed 2eeks ago   last 7 months  . GERD (gastroesophageal reflux disease)   . Hx of radiation therapy   . Hypercholesterolemia   .  Hypertension   . Prostate cancer ([place]) 12/06/13   gleason 4+3=7, 6/12 cores positive. seed implant and radiation  . Sleep apnea    no cpap used  . Urinary retention    Past Surgical History:  Procedure Laterality Date  . APPENDECTOMY    . BALLOON DILATION N/A 08/02/2015   Procedure: BALLOON DILATION;  Surgeon: [name], MD;  Location: [facility];  Service: Endoscopy;  Laterality: N/A;  . CARDIOVERSION N/A 05/13/2018   Procedure: CARDIOVERSION;  Surgeon: Sanda Klein, MD;  Location: [facility];  Service: Cardiovascular;  Laterality: N/A;  . CARDIOVERSION N/A 05/18/2018   Procedure: CARDIOVERSION;  Surgeon: Buford Dresser, MD;  Location: [facility];  Service: Cardiovascular;  Laterality: N/A;  . COLONOSCOPY WITH PROPOFOL N/A 08/02/2015   Procedure: COLONOSCOPY WITH PROPOFOL w/ APC;  Surgeon: [name], MD;  Location: [facility];  Service: Endoscopy;  Laterality: N/A;  . ESOPHAGOGASTRODUODENOSCOPY (EGD) WITH PROPOFOL N/A 08/02/2015   Procedure: ESOPHAGOGASTRODUODENOSCOPY (EGD) WITH PROPOFOL/ possible dilation.;  Surgeon: [name], MD;  Location: [facility];  Service: Endoscopy;  Laterality: N/A;  . KNEE SURGERY     right knee orthoscopic  . PROSTATE BIOPSY  12/06/13   Gleason 4+3=7, volume 35 gm  . [place]   Tourette'ssyndrome spinal fluid removed   . TONSILLECTOMY  as child  . TRANSURETHRAL RESECTION OF PROSTATE N/A 09/30/2019   Procedure: TRANSURETHRAL RESECTION OF THE PROSTATE (TURP);  Surgeon: Alexis Frock, MD;  Location: [facility];  Service: Urology;  Laterality: N/A;  1 HR     Current Outpatient Medications  Medication Sig Dispense Refill  . amLODipine (NORVASC) 2.5 MG tablet Take 1 tablet (2.5 mg total) by mouth daily. 90 tablet 3  . atorvastatin (LIPITOR) 40 MG tablet TAKE 1 TABLET BY MOUTH EVERY DAY 90 tablet 3  . blood glucose meter kit and supplies KIT Test blood sugar daily as directed. Dx code: E11.9  1 each 0  . CALCIUM PO Take 1,000 tablets by mouth daily.     Tommy Yang CINNAMON PO Take 500 mg by mouth daily.     Tommy Yang diltiazem (CARDIZEM) 30 MG tablet TAKE 1 TABLET EVERY 4 HOURS AS NEEDED FOR HR >100 45 tablet 1  . glimepiride (AMARYL) 1 MG tablet Take 1 tablet (1 mg total) by mouth daily with breakfast. 90 tablet 1  . glucose blood (ACCU-CHEK GUIDE) test strip Use as instructed to check blood sugar twice daily. 100 each 3  . INVOKAMET XR [phone] MG TB24 Take 2 tablets by mouth daily. 180 tablet 1  . leuprolide (LUPRON) 11.25 MG injection Inject 11.25 mg into the muscle every 6 (six) months.     Tommy Yang losartan (COZAAR) 50 MG tablet TAKE 1 TABLET BY MOUTH EVERY DAY  90 tablet 1  . metoprolol tartrate (LOPRESSOR) 100 MG tablet TAKE 1 TABLET BY MOUTH TWICE A DAY (Patient taking differently: Take 100 mg by mouth 2 (two) times daily. ) 180 tablet 2  . rivaroxaban (XARELTO) 20 MG TABS tablet Take 1 tablet (20 mg total) by mouth daily before breakfast. 90 tablet 1  . Semaglutide, 1 MG/DOSE, (OZEMPIC, 1 MG/DOSE,) 2 MG/1.5ML SOPN Inject 1 mg into the skin once a week. fridays 9 mL 1  . tamsulosin (FLOMAX) 0.4 MG CAPS capsule Take 1 capsule (0.4 mg total) by mouth daily. (Patient taking differently: Take 0.4 mg by mouth 2 (two) times daily. ) 30 capsule 30  . Vitamin D, Cholecalciferol, 400 UNITS TABS Take 400 Units by mouth daily.    Tommy Yang zinc gluconate 50 MG tablet Take 50 mg by mouth daily.    . pantoprazole (PROTONIX) 40 MG tablet TAKE 1 TABLET BY MOUTH EVERY DAY 90 tablet 3   No current facility-administered medications for this encounter.    Allergies:   Patient has no known allergies.   Social History:  The patient  reports that he quit smoking about 7 years ago. His smoking use included cigarettes. He has a 11.50 pack-year smoking history. He has never used smokeless tobacco. He reports current alcohol use. He reports that he does not use drugs.   Family History:  The patient's  family history includes COPD  in his brother; Cancer in his brother; Diabetes in his brother, father, and mother; Heart disease in his father and mother; Hyperlipidemia in his brother, father, and mother; Hypertension in his brother, father, and mother; Varicose Veins in his mother.    ROS:  Please see the history of present illness.   All other systems are personally reviewed and negative.   Exam:General:  No distress Eyes: EOMI, anicteric ENT: Oral Mucosa clear and moist Cardiovascular: regular rhythm, no murmurs, rubs or gallops, no edema, Respiratory: Normal respiratory effort on room air, lungs clear to auscultation bilaterally Abdomen: soft, non-distended, non-tender, normal bowel sounds. Wearing a urinary catheter with blood tinged urine. Skin: No Rash Neurologic: Grossly no focal neuro deficit.Mental status AAOx3, speech normal, Psychiatric:Appropriate affect, and mood   Recent Labs: 05/23/2019: Magnesium 2.1; TSH 0.770 10/08/2019: Hemoglobin 12.2; Platelets 289 11/18/2019: ALT 9 02/24/2020: BUN 25; Creatinine, Ser 1.15; Potassium 4.8; Sodium 141  personally reviewed    Other studies personally reviewed: Epic records reviewed     ASSESSMENT AND PLAN:  1. Permanent  atrial fibrillation Well rate controlled and minimally symptomatic  Has short acting cardizem 30 mg if needed, he rarely sees his HR over 100 bpm Continue metoprolol tartrate 100 mg bid   Pt has  deferred  AAD therapy Continue xarelto 20 mg daily  This patients CHA2DS2-VASc Score and unadjusted Ischemic Stroke Rate (% per year) is equal to 7.2 % stroke rate/year from a score of 5  2. LEE/shortness of breath with exertion  Resolved with lasix post urology surgery  Now uses prn  3. Soft BP's Hold amlodipine for now   Return in one week for BP check  F/u in 6 months   Labs/ tests ordered today include:none Orders Placed This Encounter  Procedures  . EKG 12-Lead   Addendum:7/14- Here for BP check off amlodipine.BP  is improved at  122/70. He feels like he has more energy. He has felt some increase in HR when getting up after sitting since stopping drug. He will continue to monitor and let me know if an  issue.    Signed, Roderic Palau, NP 03/07/2020 8:58 AM  [facility] [address] [place], [place] [address] [phone]

## 2020-03-07 NOTE — Patient Instructions (Signed)
Hold the Amlodipine until next week. Come back in 1 week.

## 2020-03-14 ENCOUNTER — Encounter (HOSPITAL_COMMUNITY): Payer: BC Managed Care – PPO | Admitting: Nurse Practitioner

## 2020-03-14 ENCOUNTER — Ambulatory Visit (HOSPITAL_COMMUNITY)
Admission: RE | Admit: 2020-03-14 | Discharge: 2020-03-14 | Disposition: A | Discharge status: Home / Self Care | Payer: BC Managed Care – PPO | Source: Ambulatory Visit | Attending: Nurse Practitioner | Admitting: Nurse Practitioner

## 2020-03-14 ENCOUNTER — Other Ambulatory Visit: Payer: Self-pay

## 2020-03-14 DIAGNOSIS — Z86718 Personal history of other venous thrombosis and embolism: Secondary | ICD-10-CM | POA: Insufficient documentation

## 2020-03-14 DIAGNOSIS — R339 Retention of urine, unspecified: Secondary | ICD-10-CM | POA: Diagnosis not present

## 2020-03-14 DIAGNOSIS — I10 Essential (primary) hypertension: Secondary | ICD-10-CM | POA: Diagnosis not present

## 2020-03-14 DIAGNOSIS — Z8249 Family history of ischemic heart disease and other diseases of the circulatory system: Secondary | ICD-10-CM | POA: Insufficient documentation

## 2020-03-14 DIAGNOSIS — Z8546 Personal history of malignant neoplasm of prostate: Secondary | ICD-10-CM | POA: Insufficient documentation

## 2020-03-14 DIAGNOSIS — M1990 Unspecified osteoarthritis, unspecified site: Secondary | ICD-10-CM | POA: Insufficient documentation

## 2020-03-14 DIAGNOSIS — I4811 Longstanding persistent atrial fibrillation: Secondary | ICD-10-CM | POA: Diagnosis not present

## 2020-03-14 DIAGNOSIS — Z833 Family history of diabetes mellitus: Secondary | ICD-10-CM | POA: Insufficient documentation

## 2020-03-14 DIAGNOSIS — Z79899 Other long term (current) drug therapy: Secondary | ICD-10-CM | POA: Insufficient documentation

## 2020-03-14 DIAGNOSIS — E7800 Pure hypercholesterolemia, unspecified: Secondary | ICD-10-CM | POA: Diagnosis not present

## 2020-03-14 DIAGNOSIS — E119 Type 2 diabetes mellitus without complications: Secondary | ICD-10-CM | POA: Insufficient documentation

## 2020-03-14 DIAGNOSIS — Z83438 Family history of other disorder of lipoprotein metabolism and other lipidemia: Secondary | ICD-10-CM | POA: Insufficient documentation

## 2020-03-14 DIAGNOSIS — G4730 Sleep apnea, unspecified: Secondary | ICD-10-CM | POA: Diagnosis not present

## 2020-03-14 DIAGNOSIS — Z87891 Personal history of nicotine dependence: Secondary | ICD-10-CM | POA: Diagnosis not present

## 2020-03-14 DIAGNOSIS — K219 Gastro-esophageal reflux disease without esophagitis: Secondary | ICD-10-CM | POA: Insufficient documentation

## 2020-03-14 DIAGNOSIS — Z7901 Long term (current) use of anticoagulants: Secondary | ICD-10-CM | POA: Insufficient documentation

## 2020-03-14 DIAGNOSIS — Z960 Presence of urogenital implants: Secondary | ICD-10-CM | POA: Insufficient documentation

## 2020-03-14 DIAGNOSIS — Z7984 Long term (current) use of oral hypoglycemic drugs: Secondary | ICD-10-CM | POA: Insufficient documentation

## 2020-03-14 NOTE — Addendum Note (Signed)
Encounter addended by: Sherran Needs, NP on: 03/14/2020 10:33 AM  Actions taken: Clinical Note Signed

## 2020-04-19 ENCOUNTER — Other Ambulatory Visit: Payer: Self-pay | Admitting: Internal Medicine

## 2020-04-19 DIAGNOSIS — I10 Essential (primary) hypertension: Secondary | ICD-10-CM

## 2020-04-19 NOTE — Telephone Encounter (Signed)
Please refill as per office routine med refill policy (all routine meds refilled for 3 mo or monthly per pt preference up to one year from last visit, then month to month grace period for 3 mo, then further med refills will have to be denied)  

## 2020-04-28 ENCOUNTER — Other Ambulatory Visit (HOSPITAL_COMMUNITY): Payer: Self-pay | Admitting: Nurse Practitioner

## 2020-04-28 DIAGNOSIS — I10 Essential (primary) hypertension: Secondary | ICD-10-CM

## 2020-05-18 ENCOUNTER — Other Ambulatory Visit: Payer: BC Managed Care – PPO

## 2020-05-18 DIAGNOSIS — E1165 Type 2 diabetes mellitus with hyperglycemia: Secondary | ICD-10-CM | POA: Diagnosis not present

## 2020-05-18 DIAGNOSIS — E538 Deficiency of other specified B group vitamins: Secondary | ICD-10-CM

## 2020-05-18 DIAGNOSIS — E559 Vitamin D deficiency, unspecified: Secondary | ICD-10-CM | POA: Diagnosis not present

## 2020-05-18 DIAGNOSIS — Z0000 Encounter for general adult medical examination without abnormal findings: Secondary | ICD-10-CM

## 2020-05-18 NOTE — Addendum Note (Signed)
Addended by: Hazle Quant on: 05/18/2020 08:05 AM   Modules accepted: Orders

## 2020-05-18 NOTE — Addendum Note (Signed)
Addended by: Hazle Quant on: 05/18/2020 08:07 AM   Modules accepted: Orders

## 2020-05-18 NOTE — Addendum Note (Signed)
Addended by: Hazle Quant on: 05/18/2020 08:08 AM   Modules accepted: Orders

## 2020-05-18 NOTE — Addendum Note (Signed)
Addended by: Hazle Quant on: 05/18/2020 08:06 AM   Modules accepted: Orders

## 2020-05-19 LAB — BASIC METABOLIC PANEL
BUN: 23 mg/dL (ref 7–25)
CO2: 27 mmol/L (ref 20–32)
Calcium: 9.7 mg/dL (ref 8.6–10.3)
Chloride: 106 mmol/L (ref 98–110)
Creat: 1.17 mg/dL (ref 0.70–1.25)
Glucose, Bld: 97 mg/dL (ref 65–99)
Potassium: 5.3 mmol/L (ref 3.5–5.3)
Sodium: 143 mmol/L (ref 135–146)

## 2020-05-19 LAB — HEPATIC FUNCTION PANEL
AG Ratio: 1.4 (calc) (ref 1.0–2.5)
ALT: 7 U/L — ABNORMAL LOW (ref 9–46)
AST: 8 U/L — ABNORMAL LOW (ref 10–35)
Albumin: 4 g/dL (ref 3.6–5.1)
Alkaline phosphatase (APISO): 102 U/L (ref 35–144)
Bilirubin, Direct: 0.1 mg/dL (ref 0.0–0.2)
Globulin: 2.9 g/dL (calc) (ref 1.9–3.7)
Indirect Bilirubin: 0.5 mg/dL (calc) (ref 0.2–1.2)
Total Bilirubin: 0.6 mg/dL (ref 0.2–1.2)
Total Protein: 6.9 g/dL (ref 6.1–8.1)

## 2020-05-19 LAB — LIPID PANEL
Cholesterol: 170 mg/dL (ref <200)
HDL: 37 mg/dL — ABNORMAL LOW (ref >=40)
LDL Cholesterol (Calc): 107 mg/dL (calc) — ABNORMAL HIGH
Non-HDL Cholesterol (Calc): 133 mg/dL (calc) — ABNORMAL HIGH (ref <130)
Total CHOL/HDL Ratio: 4.6 (calc) (ref <5.0)
Triglycerides: 144 mg/dL (ref <150)

## 2020-05-19 LAB — TSH: TSH: 1.12 mIU/L (ref 0.40–4.50)

## 2020-05-19 LAB — MICROALBUMIN / CREATININE URINE RATIO
Creatinine, Urine: 56 mg/dL (ref 20–320)
Microalb Creat Ratio: 98 mcg/mg creat — ABNORMAL HIGH (ref <30)
Microalb, Ur: 5.5 mg/dL

## 2020-05-19 LAB — CBC WITH DIFFERENTIAL/PLATELET
Absolute Monocytes: 988 cells/uL — ABNORMAL HIGH (ref 200–950)
Basophils Absolute: 58 cells/uL (ref 0–200)
Basophils Relative: 0.7 %
Eosinophils Absolute: 141 cells/uL (ref 15–500)
Eosinophils Relative: 1.7 %
HCT: 45.2 % (ref 38.5–50.0)
Hemoglobin: 14.1 g/dL (ref 13.2–17.1)
Lymphs Abs: 2415 cells/uL (ref 850–3900)
MCH: 26.1 pg — ABNORMAL LOW (ref 27.0–33.0)
MCHC: 31.2 g/dL — ABNORMAL LOW (ref 32.0–36.0)
MCV: 83.5 fL (ref 80.0–100.0)
MPV: 11.2 fL (ref 7.5–12.5)
Monocytes Relative: 11.9 %
Neutro Abs: 4698 cells/uL (ref 1500–7800)
Neutrophils Relative %: 56.6 %
Platelets: 243 10*3/uL (ref 140–400)
RBC: 5.41 10*6/uL (ref 4.20–5.80)
RDW: 16.2 % — ABNORMAL HIGH (ref 11.0–15.0)
Total Lymphocyte: 29.1 %
WBC: 8.3 10*3/uL (ref 3.8–10.8)

## 2020-05-19 LAB — HEMOGLOBIN A1C
Hgb A1c MFr Bld: 6.9 % of total Hgb — ABNORMAL HIGH (ref <5.7)
Mean Plasma Glucose: 151 (calc)
eAG (mmol/L): 8.4 (calc)

## 2020-05-19 LAB — VITAMIN B12: Vitamin B-12: 284 pg/mL (ref 200–1100)

## 2020-05-19 LAB — VITAMIN D 25 HYDROXY (VIT D DEFICIENCY, FRACTURES): Vit D, 25-Hydroxy: 53 ng/mL (ref 30–100)

## 2020-05-19 LAB — URINALYSIS, ROUTINE W REFLEX MICROSCOPIC
Bilirubin Urine: NEGATIVE
Hgb urine dipstick: NEGATIVE
Ketones, ur: NEGATIVE
Leukocytes,Ua: NEGATIVE
Nitrite: NEGATIVE
Protein, ur: NEGATIVE
Specific Gravity, Urine: 1.024 (ref 1.001–1.03)
pH: 6 (ref 5.0–8.0)

## 2020-05-19 LAB — PSA: PSA: 0.93 ng/mL (ref <=4.0)

## 2020-05-24 ENCOUNTER — Ambulatory Visit: Payer: BC Managed Care – PPO | Admitting: Internal Medicine

## 2020-05-27 ENCOUNTER — Other Ambulatory Visit: Payer: Self-pay | Admitting: Endocrinology

## 2020-05-28 ENCOUNTER — Other Ambulatory Visit: Payer: Self-pay | Admitting: Endocrinology

## 2020-05-28 ENCOUNTER — Other Ambulatory Visit: Payer: BC Managed Care – PPO

## 2020-05-28 DIAGNOSIS — C61 Malignant neoplasm of prostate: Secondary | ICD-10-CM | POA: Diagnosis not present

## 2020-05-29 ENCOUNTER — Encounter: Payer: Self-pay | Admitting: Internal Medicine

## 2020-05-29 ENCOUNTER — Ambulatory Visit (INDEPENDENT_AMBULATORY_CARE_PROVIDER_SITE_OTHER): Payer: BC Managed Care – PPO | Admitting: Internal Medicine

## 2020-05-29 ENCOUNTER — Other Ambulatory Visit: Payer: Self-pay

## 2020-05-29 VITALS — BP 126/80 | HR 83 | Temp 98.4°F | Ht 71.0 in | Wt 314.0 lb

## 2020-05-29 DIAGNOSIS — Z0000 Encounter for general adult medical examination without abnormal findings: Secondary | ICD-10-CM | POA: Diagnosis not present

## 2020-05-29 DIAGNOSIS — Z23 Encounter for immunization: Secondary | ICD-10-CM

## 2020-05-29 DIAGNOSIS — E1165 Type 2 diabetes mellitus with hyperglycemia: Secondary | ICD-10-CM | POA: Diagnosis not present

## 2020-05-29 NOTE — Progress Notes (Signed)
Subjective:    Patient ID: Tommy Yang, male    DOB: 31-Jan-1951, 69 y.o.   MRN: [id number]  HPI  Here for wellness and f/u;  Overall doing ok;  Pt denies Chest pain, worsening SOB, DOE, wheezing, orthopnea, PND, worsening LE edema, palpitations, dizziness or syncope.  Pt denies neurological change such as new headache, facial or extremity weakness.  Pt denies polydipsia, polyuria, or low sugar symptoms. Pt states overall good compliance with treatment and medications, good tolerability, and has been trying to follow appropriate diet.  Pt denies worsening depressive symptoms, suicidal ideation or panic. No fever, night sweats, wt loss, loss of appetite, or other constitutional symptoms.  Pt states good ability with ADL's, has low fall risk, home safety reviewed and adequate, no other significant changes in hearing or vision, and only occasionally active with exercise.  Stopped amlodipine in June per cardiology, doing well.  Has some fagitue with the lupron, has gained some wt with less exercise due to this, having occasional hot flashes during the day.  Most recent psa per pt 0.93.   Past Medical History:  Diagnosis Date  . Arthritis   . Atrial fibrillation ([place])   . Diabetes mellitus without complication ([place])    type 2  . DVT (deep venous thrombosis) ([place]) 2015   left leg.  . Foley catheter in place last changed 2eeks ago   last 7 months  . GERD (gastroesophageal reflux disease)   . Hx of radiation therapy   . Hypercholesterolemia   . Hypertension   . Prostate cancer ([place]) 12/06/13   gleason 4+3=7, 6/12 cores positive. seed implant and radiation  . Sleep apnea    no cpap used  . Urinary retention    Past Surgical History:  Procedure Laterality Date  . APPENDECTOMY    . BALLOON DILATION N/A 08/02/2015   Procedure: BALLOON DILATION;  Surgeon: [name], MD;  Location: [facility];  Service: Endoscopy;  Laterality: N/A;  . CARDIOVERSION N/A 05/13/2018   Procedure:  CARDIOVERSION;  Surgeon: Sanda Klein, MD;  Location: [facility];  Service: Cardiovascular;  Laterality: N/A;  . CARDIOVERSION N/A 05/18/2018   Procedure: CARDIOVERSION;  Surgeon: Buford Dresser, MD;  Location: [facility];  Service: Cardiovascular;  Laterality: N/A;  . COLONOSCOPY WITH PROPOFOL N/A 08/02/2015   Procedure: COLONOSCOPY WITH PROPOFOL w/ APC;  Surgeon: [name], MD;  Location: [facility];  Service: Endoscopy;  Laterality: N/A;  . ESOPHAGOGASTRODUODENOSCOPY (EGD) WITH PROPOFOL N/A 08/02/2015   Procedure: ESOPHAGOGASTRODUODENOSCOPY (EGD) WITH PROPOFOL/ possible dilation.;  Surgeon: [name], MD;  Location: [facility];  Service: Endoscopy;  Laterality: N/A;  . KNEE SURGERY     right knee orthoscopic  . PROSTATE BIOPSY  12/06/13   Gleason 4+3=7, volume 35 gm  . [place]   Tourette'ssyndrome spinal fluid removed   . TONSILLECTOMY  as child  . TRANSURETHRAL RESECTION OF PROSTATE N/A 09/30/2019   Procedure: TRANSURETHRAL RESECTION OF THE PROSTATE (TURP);  Surgeon: Alexis Frock, MD;  Location: [facility];  Service: Urology;  Laterality: N/A;  1 HR    reports that he quit smoking about 7 years ago. His smoking use included cigarettes. He has a 11.50 pack-year smoking history. He has never used smokeless tobacco. He reports current alcohol use. He reports that he does not use drugs. family history includes COPD in his brother; Cancer in his brother; Diabetes in his brother, father, and mother; Heart disease in his father and  mother; Hyperlipidemia in his brother, father, and mother; Hypertension in his brother, father, and mother; Varicose Veins in his mother. No Known Allergies Current Outpatient Medications on File Prior to Visit  Medication Sig Dispense Refill  . blood glucose meter kit and supplies KIT Test blood sugar daily as directed. Dx code: E11.9 1 each 0  . CALCIUM PO Take 1,000 tablets by mouth daily.     [name]  CINNAMON PO Take 500 mg by mouth daily.     [name] diltiazem (CARDIZEM) 30 MG tablet TAKE 1 TABLET EVERY 4 HOURS AS NEEDED FOR HR >100 45 tablet 1  . furosemide (LASIX) 20 MG tablet Take 20 mg by mouth daily as needed. (Patient not taking: Reported on 06/01/2020)    . glimepiride (AMARYL) 1 MG tablet TAKE 1 TABLET (1 MG TOTAL) BY MOUTH DAILY WITH BREAKFAST. 90 tablet 1  . glucose blood (ACCU-CHEK GUIDE) test strip Use as instructed to check blood sugar twice daily. 100 each 3  . INVOKAMET XR [phone] MG TB24 Take 2 tablets by mouth daily. 180 tablet 1  . leuprolide (LUPRON) 11.25 MG injection Inject 11.25 mg into the muscle every 6 (six) months.     [name] losartan (COZAAR) 50 MG tablet TAKE 1 TABLET BY MOUTH EVERY DAY 90 tablet 1  . metoprolol tartrate (LOPRESSOR) 100 MG tablet TAKE 1 TABLET BY MOUTH TWICE A DAY 180 tablet 2  . pantoprazole (PROTONIX) 40 MG tablet TAKE 1 TABLET BY MOUTH EVERY DAY 90 tablet 3  . rivaroxaban (XARELTO) 20 MG TABS tablet Take 1 tablet (20 mg total) by mouth daily before breakfast. 90 tablet 1  . tamsulosin (FLOMAX) 0.4 MG CAPS capsule Take 1 capsule (0.4 mg total) by mouth daily. (Patient taking differently: Take 0.4 mg by mouth 2 (two) times daily. ) 30 capsule 30  . Vitamin D, Cholecalciferol, 400 UNITS TABS Take 400 Units by mouth daily.    [name] zinc gluconate 50 MG tablet Take 50 mg by mouth daily.     No current facility-administered medications on file prior to visit.   Review of Systems All otherwise neg per pt    Objective:   Physical Exam BP 126/80 (BP Location: Left Arm, Patient Position: Sitting, Cuff Size: Large)   Pulse 83   Temp 98.4 F (36.9 C) (Oral)   Ht 5' 11" (1.803 m)   Wt (!) 314 lb (142.4 kg)   SpO2 93%   BMI 43.79 kg/m  VS noted,  Constitutional: Pt appears in NAD HENT: Head: NCAT.  Right Ear: External ear normal.  Left Ear: External ear normal.  Eyes: . Pupils are equal, round, and reactive to light. Conjunctivae and EOM are normal Nose:  without d/c or deformity Neck: Neck supple. Gross normal ROM Cardiovascular: Normal rate and regular rhythm.   Pulmonary/Chest: Effort normal and breath sounds without rales or wheezing.  Abd:  Soft, NT, ND, + BS, no organomegaly Neurological: Pt is alert. At baseline orientation, motor grossly intact Skin: Skin is warm. No rashes, other new lesions, no LE edema Psychiatric: Pt behavior is normal without agitation  All otherwise neg per pt Lab Results  Component Value Date   WBC 8.3 05/18/2020   HGB 14.1 05/18/2020   HCT 45.2 05/18/2020   PLT 243 05/18/2020   GLUCOSE 97 05/18/2020   CHOL 170 05/18/2020   TRIG 144 05/18/2020   HDL 37 (L) 05/18/2020   LDLDIRECT 93.0 12/30/2016   LDLCALC 107 (H) 05/18/2020   ALT 7 (L) 05/18/2020  AST 8 (L) 05/18/2020   NA 143 05/18/2020   K 5.3 05/18/2020   CL 106 05/18/2020   CREATININE 1.17 05/18/2020   BUN 23 05/18/2020   CO2 27 05/18/2020   TSH 1.12 05/18/2020   PSA 0.93 05/18/2020   INR 1.55 (H) 10/19/2013   HGBA1C 6.9 (H) 05/18/2020   MICROALBUR 5.5 05/18/2020      Assessment & Plan:

## 2020-05-29 NOTE — Patient Instructions (Signed)
Please remember to go online on [facility] or [facility] or [email or web address] for the booster shot  Please continue all other medications as before, and refills have been done if requested.  Please have the pharmacy call with any other refills you may need.  Please continue your efforts at being more active, low cholesterol diet, and weight control.  You are otherwise up to date with prevention measures today.  Please keep your appointments with your specialists as you may have planned - Dr Dwyane Dee  Please make an Appointment to return for your 1 year visit, or sooner if needed

## 2020-06-01 ENCOUNTER — Other Ambulatory Visit: Payer: Self-pay

## 2020-06-01 ENCOUNTER — Ambulatory Visit (INDEPENDENT_AMBULATORY_CARE_PROVIDER_SITE_OTHER): Payer: BC Managed Care – PPO | Admitting: Endocrinology

## 2020-06-01 ENCOUNTER — Other Ambulatory Visit: Payer: Self-pay | Admitting: Endocrinology

## 2020-06-01 VITALS — BP 118/78 | HR 71 | Ht 71.0 in | Wt 315.2 lb

## 2020-06-01 DIAGNOSIS — I10 Essential (primary) hypertension: Secondary | ICD-10-CM

## 2020-06-01 DIAGNOSIS — E782 Mixed hyperlipidemia: Secondary | ICD-10-CM

## 2020-06-01 DIAGNOSIS — E1165 Type 2 diabetes mellitus with hyperglycemia: Secondary | ICD-10-CM

## 2020-06-01 MED ORDER — ATORVASTATIN CALCIUM 80 MG PO TABS: 80.0000 mg | ORAL_TABLET | Freq: Every day | ORAL | 3 refills | Status: DC

## 2020-06-01 MED ORDER — OZEMPIC (1 MG/DOSE) 2 MG/1.5ML ~~LOC~~ SOPN
1.0000 mg | PEN_INJECTOR | SUBCUTANEOUS | 1 refills | Status: DC
Start: 2020-06-01 — End: 2020-07-18

## 2020-06-01 NOTE — Patient Instructions (Addendum)
Ok to use B 12 500ug daily  Check blood sugars on waking up  2 days a week  Also check blood sugars about 2 hours after meals and do this after different meals by rotation  Recommended blood sugar levels on waking up are 90-130 and about 2 hours after meal is 130-160  Please bring your blood sugar monitor to each visit, thank you  Take 2 Lipitor

## 2020-06-01 NOTE — Progress Notes (Signed)
Patient ID: Tommy Yang, male   DOB: December 12, 1950, 69 y.o.   MRN: [id number]           Reason for Appointment: Follow-up for Type 2 Diabetes    History of Present Illness:          Diagnosis: Type 2 diabetes mellitus, date of diagnosis: 05/2013       Past history:   He apparently had significant hyperglycemia in 2014 when seen in the urgent care center but did not establish with a PCP for control. He was started on treatment for his diabetes in 10/2013 and he was initially treated with metformin and Januvia A few months later he was also given glipizide ER to help his control when his A1c had gone up to 10.9 He says he was also seen by dietitian but no record available of this. Subsequently his blood sugar control had improved with A1c down to 6.8 in 9/15 On his consultation in 3/16 he had high blood sugars and weight gain; A1c was 9.1% with taking glipizide ER, Januvia and metformin.  Recent history:    Non-insulin hypoglycemic drugs the patient is taking are: Amaryl 1 mg at dinner, Invokamet XR 150/1000, 2 tablets daily, Ozempic 1 mg weekly  His A1c has improved down to 6.9 compared to 7.2  Current blood sugar patterns and problems identified:  He has had a slightly better A1c  He thinks is from trying to increase his activity level  However has not lost weight  He is usually trying to monitor his overall carbohydrate intake and portions  Generally not checking readings before breakfast but lab blood sugar was excellent at 97  Blood sugars are generally about the same throughout the day when he monitors them  Side effects from medications have been: None  Compliance with the medical regimen: Fair  Hypoglycemia: None    Glucose monitoring:  done 1-2 times a day         Glucometer:  [place]  Blood Glucose readings by meter report, analysis not very clear and meter readings are not sorted by time of day  AVERAGE for 30 days 141 previously 136,   PRE-meal average  115  After meal average 143, highest blood sugar 182      Self-care: The diet that the patient has been following is: tries to limit portions and fats .      Meals: 3 meals per day. Breakfast is sometimes cereal,sausage  Fruits for snacks  Exercise:  A little walking and yardwork  Dietician visit, most recent:?  2015 .               Weight history:    Wt Readings from Last 3 Encounters:  06/01/20 (!) 315 lb 3.2 oz (143 kg)  05/29/20 (!) 314 lb (142.4 kg)  03/07/20 (!) 311 lb 9.6 oz (141.3 kg)    Glycemic control:   Lab Results  Component Value Date   HGBA1C 6.9 (H) 05/18/2020   HGBA1C 7.2 (H) 02/24/2020   HGBA1C 6.6 (H) 11/18/2019   Lab Results  Component Value Date   MICROALBUR 5.5 05/18/2020   LDLCALC 107 (H) 05/18/2020   CREATININE 1.17 05/18/2020   Lab Results  Component Value Date   HGB 14.1 05/18/2020     No visits with results within 1 Week(s) from this visit.  Latest known visit with results is:  Lab on 05/18/2020  Component Date Value Ref Range Status  . Creatinine, Urine 05/18/2020 56  20 - 320  mg/dL Final  . Microalb, Ur 05/18/2020 5.5  mg/dL Final   Comment: Reference Range Not established   . Microalb Creat Ratio 05/18/2020 98* <30 mcg/mg creat Final   Comment: . The ADA defines abnormalities in albumin excretion as follows: [name] Albuminuria Category        Result (mcg/mg creatinine) . Normal to Mildly increased   <30 Moderately increased         30-299  Severely increased           > OR = 300 . The ADA recommends that at least two of three specimens collected within a 3-6 month period be abnormal before considering a patient to be within a diagnostic category.   . Hgb A1c MFr Bld 05/18/2020 6.9* <5.7 % of total Hgb Final   Comment: For someone without known diabetes, a hemoglobin A1c value of 6.5% or greater indicates that they may have  diabetes and this should be confirmed with a follow-up  test. . For someone with known diabetes, a  value <7% indicates  that their diabetes is well controlled and a value  greater than or equal to 7% indicates suboptimal  control. A1c targets should be individualized based on  duration of diabetes, age, comorbid conditions, and  other considerations. . Currently, no consensus exists regarding use of hemoglobin A1c for diagnosis of diabetes for children. .   . Mean Plasma Glucose 05/18/2020 151  (calc) Final  . eAG (mmol/L) 05/18/2020 8.4  (calc) Final  . Cholesterol 05/18/2020 170  <200 mg/dL Final  . HDL 05/18/2020 37* > OR = 40 mg/dL Final  . Triglycerides 05/18/2020 144  <150 mg/dL Final  . LDL Cholesterol (Calc) 05/18/2020 107* mg/dL (calc) Final   Comment: Reference range: <100 . Desirable range <100 mg/dL for primary prevention;   <70 mg/dL for patients with CHD or diabetic patients  with > or = 2 CHD risk factors. [name] LDL-C is now calculated using the Martin-Hopkins  calculation, which is a validated novel method providing  better accuracy than the Friedewald equation in the  estimation of LDL-C.  Cresenciano Genre et al. Annamaria Helling. 6812;751(70): 2061-2068  (http://education.[facility].com/faq/FAQ164)   . Total CHOL/HDL Ratio 05/18/2020 4.6  <5.0 (calc) Final  . Non-HDL Cholesterol (Calc) 05/18/2020 133* <130 mg/dL (calc) Final   Comment: For patients with diabetes plus 1 major ASCVD risk  factor, treating to a non-HDL-C goal of <100 mg/dL  (LDL-C of <70 mg/dL) is considered a therapeutic  option.   . Glucose, Bld 05/18/2020 97  65 - 99 mg/dL Final   Comment: .            Fasting reference interval .   . BUN 05/18/2020 23  7 - 25 mg/dL Final  . Creat 05/18/2020 1.17  0.70 - 1.25 mg/dL Final   Comment: For patients >22 years of age, the reference limit for Creatinine is approximately 13% higher for people identified as African-American. .   Havery Moros Ratio 01/74/9449 NOT APPLICABLE  6 - 22 (calc) Final  . Sodium 05/18/2020 143  135 - 146 mmol/L Final  .  Potassium 05/18/2020 5.3  3.5 - 5.3 mmol/L Final  . Chloride 05/18/2020 106  98 - 110 mmol/L Final  . CO2 05/18/2020 27  20 - 32 mmol/L Final  . Calcium 05/18/2020 9.7  8.6 - 10.3 mg/dL Final  . Total Protein 05/18/2020 6.9  6.1 - 8.1 g/dL Final  . Albumin 05/18/2020 4.0  3.6 - 5.1 g/dL Final  . Globulin  05/18/2020 2.9  1.9 - 3.7 g/dL (calc) Final  . AG Ratio 05/18/2020 1.4  1.0 - 2.5 (calc) Final  . Total Bilirubin 05/18/2020 0.6  0.2 - 1.2 mg/dL Final  . Bilirubin, Direct 05/18/2020 0.1  0.0 - 0.2 mg/dL Final  . Indirect Bilirubin 05/18/2020 0.5  0.2 - 1.2 mg/dL (calc) Final  . Alkaline phosphatase (APISO) 05/18/2020 102  35 - 144 U/L Final  . AST 05/18/2020 8* 10 - 35 U/L Final  . ALT 05/18/2020 7* 9 - 46 U/L Final  . TSH 05/18/2020 1.12  0.40 - 4.50 mIU/L Final  . WBC 05/18/2020 8.3  3.8 - 10.8 Thousand/uL Final  . RBC 05/18/2020 5.41  4.20 - 5.80 Million/uL Final  . Hemoglobin 05/18/2020 14.1  13.2 - 17.1 g/dL Final  . HCT 05/18/2020 45.2  38 - 50 % Final  . MCV 05/18/2020 83.5  80.0 - 100.0 fL Final  . MCH 05/18/2020 26.1* 27.0 - 33.0 pg Final  . MCHC 05/18/2020 31.2* 32.0 - 36.0 g/dL Final  . RDW 05/18/2020 16.2* 11.0 - 15.0 % Final  . Platelets 05/18/2020 243  140 - 400 Thousand/uL Final  . MPV 05/18/2020 11.2  7.5 - 12.5 fL Final  . Neutro Abs 05/18/2020 4,698  1,500 - 7,800 cells/uL Final  . Lymphs Abs 05/18/2020 2,415  850 - 3,900 cells/uL Final  . Absolute Monocytes 05/18/2020 988* 200 - 950 cells/uL Final  . Eosinophils Absolute 05/18/2020 141  15 - 500 cells/uL Final  . Basophils Absolute 05/18/2020 58  0 - 200 cells/uL Final  . Neutrophils Relative % 05/18/2020 56.6  % Final  . Total Lymphocyte 05/18/2020 29.1  % Final  . Monocytes Relative 05/18/2020 11.9  % Final  . Eosinophils Relative 05/18/2020 1.7  % Final  . Basophils Relative 05/18/2020 0.7  % Final  . Color, Urine 05/18/2020 YELLOW  YELLOW Final  . APPearance 05/18/2020 CLEAR  CLEAR Final  . Specific  Gravity, Urine 05/18/2020 1.024  1.001 - 1.03 Final  . pH 05/18/2020 6.0  5.0 - 8.0 Final  . Glucose, UA 05/18/2020 3+* NEGATIVE Final  . Bilirubin Urine 05/18/2020 NEGATIVE  NEGATIVE Final  . Ketones, ur 05/18/2020 NEGATIVE  NEGATIVE Final  . Hgb urine dipstick 05/18/2020 NEGATIVE  NEGATIVE Final  . Protein, ur 05/18/2020 NEGATIVE  NEGATIVE Final  . Nitrite 05/18/2020 NEGATIVE  NEGATIVE Final  . Chalmers Guest 05/18/2020 NEGATIVE  NEGATIVE Final  . PSA 05/18/2020 0.93  < OR = 4.0 ng/mL Final   Comment: The total PSA value from this assay system is  standardized against the WHO standard. The test  result will be approximately 20% lower when compared  to the equimolar-standardized total PSA (Beckman  Coulter). Comparison of serial PSA results should be  interpreted with this fact in mind. . This test was performed using the Siemens  chemiluminescent method. Values obtained from  different assay methods cannot be used interchangeably. PSA levels, regardless of value, should not be interpreted as absolute evidence of the presence or absence of disease.   . Vit D, 25-Hydroxy 05/18/2020 53  30 - 100 ng/mL Final   Comment: Vitamin D Status         25-OH Vitamin D: . Deficiency:                    <20 ng/mL Insufficiency:             20 - 29 ng/mL Optimal:                 >  or = 30 ng/mL . For 25-OH Vitamin D testing on patients on  D2-supplementation and patients for whom quantitation  of D2 and D3 fractions is required, the QuestAssureD(TM) 25-OH VIT D, (D2,D3), LC/MS/MS is recommended: order  code [phone] (patients >37yr). See Note 1 . Note 1 . For additional information, please refer to  http://education.[facility].com/faq/FAQ199  (This link is being provided for informational/ educational purposes only.)   . Vitamin B-12 05/18/2020 284  200 - 1,100 pg/mL Final   Comment: . Please Note: Although the reference range for vitamin B12 is [phone] pg/mL, it has been  reported that between 5 and 10% of patients with values between 200 and 400 pg/mL may experience neuropsychiatric and hematologic abnormalities due to occult B12 deficiency; less than 1% of patients with values above 400 pg/mL will have symptoms. .       Allergies as of 06/01/2020   No Known Allergies     Medication List       Accurate as of June 01, 2020  8:41 AM. If you have any questions, ask your nurse or doctor.        amLODipine 2.5 MG tablet Commonly known as: NORVASC Take 1 tablet (2.5 mg total) by mouth daily.   atorvastatin 40 MG tablet Commonly known as: LIPITOR TAKE 1 TABLET BY MOUTH EVERY DAY   blood glucose meter kit and supplies Kit Test blood sugar daily as directed. Dx code: E11.9   CALCIUM PO Take 1,000 tablets by mouth daily.   CINNAMON PO Take 500 mg by mouth daily.   diltiazem 30 MG tablet Commonly known as: CARDIZEM TAKE 1 TABLET EVERY 4 HOURS AS NEEDED FOR HR >100   furosemide 20 MG tablet Commonly known as: LASIX Take 20 mg by mouth daily as needed.   glimepiride 1 MG tablet Commonly known as: AMARYL TAKE 1 TABLET (1 MG TOTAL) BY MOUTH DAILY WITH BREAKFAST.   glucose blood test strip Commonly known as: Accu-Chek Guide Use as instructed to check blood sugar twice daily.   Invokamet XR [phone] MG Tb24 Generic drug: Canagliflozin-metFORMIN HCl ER Take 2 tablets by mouth daily.   leuprolide 11.25 MG injection Commonly known as: LUPRON Inject 11.25 mg into the muscle every 6 (six) months.   losartan 50 MG tablet Commonly known as: COZAAR TAKE 1 TABLET BY MOUTH EVERY DAY   metoprolol tartrate 100 MG tablet Commonly known as: LOPRESSOR TAKE 1 TABLET BY MOUTH TWICE A DAY   Ozempic (1 MG/DOSE) 2 MG/1.5ML Sopn Generic drug: Semaglutide (1 MG/DOSE) INJECT 1 MG INTO THE SKIN ONCE A WEEK. FRIDAYS   pantoprazole 40 MG tablet Commonly known as: PROTONIX TAKE 1 TABLET BY MOUTH EVERY DAY   rivaroxaban 20 MG Tabs tablet Commonly  known as: XARELTO Take 1 tablet (20 mg total) by mouth daily before breakfast.   tamsulosin 0.4 MG Caps capsule Commonly known as: FLOMAX Take 1 capsule (0.4 mg total) by mouth daily. What changed: when to take this   Vitamin D (Cholecalciferol) 10 MCG (400 UNIT) Tabs Take 400 Units by mouth daily.   zinc gluconate 50 MG tablet Take 50 mg by mouth daily.       Allergies:  No Known Allergies  Past Medical History:  Diagnosis Date  . Arthritis   . [place]rial fibrillation ([place]   . Diabetes mellitus without complication ([facility]    type 2  . DVT (deep venous [place]) ([facility]   left leg.  . Foley catheter in place last changed 2eeks  ago   last 7 months  . GERD (gastroesophageal reflux disease)   . Hx of radiation therapy   . Hypercholesterolemia   . Hypertension   . Prostate cancer ([place]) 12/06/13   gleason 4+3=7, 6/12 cores positive. seed implant and radiation  . Sleep apnea    no cpap used  . Urinary retention     Past Surgical History:  Procedure Laterality Date  . APPENDECTOMY    . BALLOON DILATION N/A 08/02/2015   Procedure: BALLOON DILATION;  Surgeon: [name], MD;  Location: [facility];  Service: Endoscopy;  Laterality: N/A;  . CARDIOVERSION N/A 05/13/2018   Procedure: CARDIOVERSION;  Surgeon: Sanda Klein, MD;  Location: [facility];  Service: Cardiovascular;  Laterality: N/A;  . CARDIOVERSION N/A 05/18/2018   Procedure: CARDIOVERSION;  Surgeon: Buford Dresser, MD;  Location: [facility];  Service: Cardiovascular;  Laterality: N/A;  . COLONOSCOPY WITH PROPOFOL N/A 08/02/2015   Procedure: COLONOSCOPY WITH PROPOFOL w/ APC;  Surgeon: [name], MD;  Location: [facility];  Service: Endoscopy;  Laterality: N/A;  . ESOPHAGOGASTRODUODENOSCOPY (EGD) WITH PROPOFOL N/A 08/02/2015   Procedure: ESOPHAGOGASTRODUODENOSCOPY (EGD) WITH PROPOFOL/ possible dilation.;  Surgeon: [name], MD;  Location: [facility];  Service: Endoscopy;  Laterality:  N/A;  . KNEE SURGERY     right knee orthoscopic  . PROSTATE BIOPSY  12/06/13   Gleason 4+3=7, volume 35 gm  . [place]   Tourette'ssyndrome spinal fluid removed   . TONSILLECTOMY  as child  . TRANSURETHRAL RESECTION OF PROSTATE N/A 09/30/2019   Procedure: TRANSURETHRAL RESECTION OF THE PROSTATE (TURP);  Surgeon: Alexis Frock, MD;  Location: [facility];  Service: Urology;  Laterality: N/A;  1 HR    Family History  Problem Relation Age of Onset  . Heart disease Mother        before age 3  . Diabetes Mother   . Hyperlipidemia Mother   . Varicose Veins Mother   . Hypertension Mother   . Heart disease Father   . Diabetes Father   . Hyperlipidemia Father   . Hypertension Father   . Cancer Brother   . COPD Brother   . Diabetes Brother   . Hyperlipidemia Brother   . Hypertension Brother     Social History:  reports that he quit smoking about 7 years ago. His smoking use included cigarettes. He has a 11.50 pack-year smoking history. He has never used smokeless tobacco. He reports current alcohol use. He reports that he does not use drugs.    Review of Systems        HYPERLIPIDEMIA: he has had significant hyperlipidemia and has been treated with Lipitor 40 mg daily Previously had been taking Vascepa for triglycerides but he felt that it would cause atrial fibrillation an hour after taking the medicine in the evening and he stopped this  Has no history of CAD His diet is usually low in fat  Continues to have low HDL Triglycerides are still relatively normal despite stopping Vascepa LDL has been very consistent below 100 in the past but now 107 despite taking Lipitor regularly        Lab Results  Component Value Date   CHOL 170 05/18/2020   CHOL 144 11/18/2019   CHOL 144 02/03/2019   Lab Results  Component Value Date   HDL 37 (L) 05/18/2020   HDL 37.30 (L) 11/18/2019   HDL 34.10 (L) 02/03/2019   Lab Results  Component Value Date  LDLCALC 107 (H) 05/18/2020   LDLCALC 83 11/18/2019   LDLCALC 84 02/03/2019   Lab Results  Component Value Date   TRIG 144 05/18/2020   TRIG 117.0 11/18/2019   TRIG 128.0 02/03/2019   Lab Results  Component Value Date   CHOLHDL 4.6 05/18/2020   CHOLHDL 4 11/18/2019   CHOLHDL 4 02/03/2019   Lab Results  Component Value Date   LDLDIRECT 93.0 12/30/2016   LDLDIRECT 103.0 02/08/2015                HYPERTENSION: Mild and has been treated with losartan 50 mg and low-dose metoprolol, followed by PCP and cardiologist   BP Readings from Last 3 Encounters:  06/01/20 118/78  05/29/20 126/80  03/14/20 122/70    RENAL function is consistently good  Lab Results  Component Value Date   CREATININE 1.17 05/18/2020   CREATININE 1.15 02/24/2020   CREATININE 1.12 11/18/2019    Followed by cardiologist for atrial fibrillation   LABS:  No visits with results within 1 Week(s) from this visit.  Latest known visit with results is:  Lab on 05/18/2020  Component Date Value Ref Range Status  . Creatinine, Urine 05/18/2020 56  20 - 320 mg/dL Final  . Microalb, Ur 05/18/2020 5.5  mg/dL Final   Comment: Reference Range Not established   . Microalb Creat Ratio 05/18/2020 98* <30 mcg/mg creat Final   Comment: . The ADA defines abnormalities in albumin excretion as follows: [name] Albuminuria Category        Result (mcg/mg creatinine) . Normal to Mildly increased   <30 Moderately increased         30-299  Severely increased           > OR = 300 . The ADA recommends that at least two of three specimens collected within a 3-6 month period be abnormal before considering a patient to be within a diagnostic category.   . Hgb A1c MFr Bld 05/18/2020 6.9* <5.7 % of total Hgb Final   Comment: For someone without known diabetes, a hemoglobin A1c value of 6.5% or greater indicates that they may have  diabetes and this should be confirmed with a follow-up  test. . For someone with known  diabetes, a value <7% indicates  that their diabetes is well controlled and a value  greater than or equal to 7% indicates suboptimal  control. A1c targets should be individualized based on  duration of diabetes, age, comorbid conditions, and  other considerations. . Currently, no consensus exists regarding use of hemoglobin A1c for diagnosis of diabetes for children. .   . Mean Plasma Glucose 05/18/2020 151  (calc) Final  . eAG (mmol/L) 05/18/2020 8.4  (calc) Final  . Cholesterol 05/18/2020 170  <200 mg/dL Final  . HDL 05/18/2020 37* > OR = 40 mg/dL Final  . Triglycerides 05/18/2020 144  <150 mg/dL Final  . LDL Cholesterol (Calc) 05/18/2020 107* mg/dL (calc) Final   Comment: Reference range: <100 . Desirable range <100 mg/dL for primary prevention;   <70 mg/dL for patients with CHD or diabetic patients  with > or = 2 CHD risk factors. [name] LDL-C is now calculated using the Martin-Hopkins  calculation, which is a validated novel method providing  better accuracy than the Friedewald equation in the  estimation of LDL-C.  Cresenciano Genre et al. Annamaria Helling. 3149;702(63): 2061-2068  (http://education.[facility].com/faq/FAQ164)   . Total CHOL/HDL Ratio 05/18/2020 4.6  <5.0 (calc) Final  . Non-HDL Cholesterol (Calc) 05/18/2020 133* <130 mg/dL (calc)  Final   Comment: For patients with diabetes plus 1 major ASCVD risk  factor, treating to a non-HDL-C goal of <100 mg/dL  (LDL-C of <70 mg/dL) is considered a therapeutic  option.   . Glucose, Bld 05/18/2020 97  65 - 99 mg/dL Final   Comment: .            Fasting reference interval .   . BUN 05/18/2020 23  7 - 25 mg/dL Final  . Creat 05/18/2020 1.17  0.70 - 1.25 mg/dL Final   Comment: For patients >49 years of age, the reference limit for Creatinine is approximately 13% higher for people identified as African-American. .   Havery Moros Ratio 47/03/6150 NOT APPLICABLE  6 - 22 (calc) Final  . Sodium 05/18/2020 143  135 - 146 mmol/L  Final  . Potassium 05/18/2020 5.3  3.5 - 5.3 mmol/L Final  . Chloride 05/18/2020 106  98 - 110 mmol/L Final  . CO2 05/18/2020 27  20 - 32 mmol/L Final  . Calcium 05/18/2020 9.7  8.6 - 10.3 mg/dL Final  . Total Protein 05/18/2020 6.9  6.1 - 8.1 g/dL Final  . Albumin 05/18/2020 4.0  3.6 - 5.1 g/dL Final  . Globulin 05/18/2020 2.9  1.9 - 3.7 g/dL (calc) Final  . AG Ratio 05/18/2020 1.4  1.0 - 2.5 (calc) Final  . Total Bilirubin 05/18/2020 0.6  0.2 - 1.2 mg/dL Final  . Bilirubin, Direct 05/18/2020 0.1  0.0 - 0.2 mg/dL Final  . Indirect Bilirubin 05/18/2020 0.5  0.2 - 1.2 mg/dL (calc) Final  . Alkaline phosphatase (APISO) 05/18/2020 102  35 - 144 U/L Final  . AST 05/18/2020 8* 10 - 35 U/L Final  . ALT 05/18/2020 7* 9 - 46 U/L Final  . TSH 05/18/2020 1.12  0.40 - 4.50 mIU/L Final  . WBC 05/18/2020 8.3  3.8 - 10.8 Thousand/uL Final  . RBC 05/18/2020 5.41  4.20 - 5.80 Million/uL Final  . Hemoglobin 05/18/2020 14.1  13.2 - 17.1 g/dL Final  . HCT 05/18/2020 45.2  38 - 50 % Final  . MCV 05/18/2020 83.5  80.0 - 100.0 fL Final  . MCH 05/18/2020 26.1* 27.0 - 33.0 pg Final  . MCHC 05/18/2020 31.2* 32.0 - 36.0 g/dL Final  . RDW 05/18/2020 16.2* 11.0 - 15.0 % Final  . Platelets 05/18/2020 243  140 - 400 Thousand/uL Final  . MPV 05/18/2020 11.2  7.5 - 12.5 fL Final  . Neutro Abs 05/18/2020 4,698  1,500 - 7,800 cells/uL Final  . Lymphs Abs 05/18/2020 2,415  850 - 3,900 cells/uL Final  . Absolute Monocytes 05/18/2020 988* 200 - 950 cells/uL Final  . Eosinophils Absolute 05/18/2020 141  15 - 500 cells/uL Final  . Basophils Absolute 05/18/2020 58  0 - 200 cells/uL Final  . Neutrophils Relative % 05/18/2020 56.6  % Final  . Total Lymphocyte 05/18/2020 29.1  % Final  . Monocytes Relative 05/18/2020 11.9  % Final  . Eosinophils Relative 05/18/2020 1.7  % Final  . Basophils Relative 05/18/2020 0.7  % Final  . Color, Urine 05/18/2020 YELLOW  YELLOW Final  . APPearance 05/18/2020 CLEAR  CLEAR Final  .  Specific Gravity, Urine 05/18/2020 1.024  1.001 - 1.03 Final  . pH 05/18/2020 6.0  5.0 - 8.0 Final  . Glucose, UA 05/18/2020 3+* NEGATIVE Final  . Bilirubin Urine 05/18/2020 NEGATIVE  NEGATIVE Final  . Ketones, ur 05/18/2020 NEGATIVE  NEGATIVE Final  . Hgb urine dipstick 05/18/2020 NEGATIVE  NEGATIVE Final  .  Protein, ur 05/18/2020 NEGATIVE  NEGATIVE Final  . Nitrite 05/18/2020 NEGATIVE  NEGATIVE Final  . Chalmers Guest 05/18/2020 NEGATIVE  NEGATIVE Final  . PSA 05/18/2020 0.93  < OR = 4.0 ng/mL Final   Comment: The total PSA value from this assay system is  standardized against the WHO standard. The test  result will be approximately 20% lower when compared  to the equimolar-standardized total PSA (Beckman  Coulter). Comparison of serial PSA results should be  interpreted with this fact in mind. . This test was performed using the Siemens  chemiluminescent method. Values obtained from  different assay methods cannot be used interchangeably. PSA levels, regardless of value, should not be interpreted as absolute evidence of the presence or absence of disease.   . Vit D, 25-Hydroxy 05/18/2020 53  30 - 100 ng/mL Final   Comment: Vitamin D Status         25-OH Vitamin D: . Deficiency:                    <20 ng/mL Insufficiency:             20 - 29 ng/mL Optimal:                 > or = 30 ng/mL . For 25-OH Vitamin D testing on patients on  D2-supplementation and patients for whom quantitation  of D2 and D3 fractions is required, the QuestAssureD(TM) 25-OH VIT D, (D2,D3), LC/MS/MS is recommended: order  code [phone] (patients >68yr). See Note 1 . Note 1 . For additional information, please refer to  http://education.[facility].com/faq/FAQ199  (This link is being provided for informational/ educational purposes only.)   . Vitamin B-12 05/18/2020 284  200 - 1,100 pg/mL Final   Comment: . Please Note: Although the reference range for vitamin B12 is [phone] pg/mL, it has  been reported that between 5 and 10% of patients with values between 200 and 400 pg/mL may experience neuropsychiatric and hematologic abnormalities due to occult B12 deficiency; less than 1% of patients with values above 400 pg/mL will have symptoms. .     Physical Examination:  BP 118/78 (BP Location: Left Arm, Patient Position: Sitting, Cuff Size: Normal)   Pulse 71   Ht '5\' 11"'  (1.803 m)   Wt (!) 315 lb 3.2 oz (143 kg)   SpO2 97%   BMI 43.96 kg/m   Diabetes type 2, morbidly obese, non-insulin-dependent   See history of present illness for detailed discussion of his current management, blood sugar patterns and problems identified   His A1c is relatively good at 6.9  Currently on 1 mg Ozempic, Invokamet and low-dose Amaryl Level of control is slightly better he is trying to be a little more active Blood sugars are fairly good at home most of the time  HYPERTENSION: well controlled  LIPIDS: LDL is 107 and higher than usual despite no change in diet or weight   PLAN:  Does not need to check blood sugar more than once a day He will alternate sugar readings after meals and occasionally in the morning Again encouraged him to try to keep his portions control and work on exercise as much as tolerated No change in diabetes regimen  He needs to increase Lipitor to 80 mg as LDL is higher than target, reassured him that there should be safe to do long-term  Since his B12 is low normal he can take 500 mcg supplement daily considering that he is on Metformin long-term Reassured him  that Metformin is safe to use long-term    There are no Patient Instructions on file for this visit.      Elayne Snare 06/01/2020, 8:41 AM   Note: This office note was prepared with Dragon voice recognition system technology. Any transcriptional errors that result from this process are unintentional.

## 2020-06-03 ENCOUNTER — Encounter: Payer: Self-pay | Admitting: Internal Medicine

## 2020-06-03 NOTE — Assessment & Plan Note (Signed)
stable overall by history and exam, recent data reviewed with pt, and pt to continue medical treatment as before,  to f/u any worsening symptoms or concerns  

## 2020-06-03 NOTE — Assessment & Plan Note (Signed)

## 2020-06-04 DIAGNOSIS — C61 Malignant neoplasm of prostate: Secondary | ICD-10-CM | POA: Diagnosis not present

## 2020-06-04 DIAGNOSIS — R338 Other retention of urine: Secondary | ICD-10-CM | POA: Diagnosis not present

## 2020-06-04 DIAGNOSIS — C778 Secondary and unspecified malignant neoplasm of lymph nodes of multiple regions: Secondary | ICD-10-CM | POA: Diagnosis not present

## 2020-06-04 DIAGNOSIS — Z5111 Encounter for antineoplastic chemotherapy: Secondary | ICD-10-CM | POA: Diagnosis not present

## 2020-06-04 DIAGNOSIS — N5201 Erectile dysfunction due to arterial insufficiency: Secondary | ICD-10-CM | POA: Diagnosis not present

## 2020-07-02 ENCOUNTER — Other Ambulatory Visit: Payer: Self-pay | Admitting: Endocrinology

## 2020-07-02 ENCOUNTER — Other Ambulatory Visit: Payer: Self-pay | Admitting: Internal Medicine

## 2020-07-10 ENCOUNTER — Telehealth: Payer: Self-pay | Admitting: Endocrinology

## 2020-07-10 ENCOUNTER — Other Ambulatory Visit: Payer: Self-pay | Admitting: *Deleted

## 2020-07-10 ENCOUNTER — Other Ambulatory Visit: Payer: Self-pay | Admitting: Internal Medicine

## 2020-07-10 ENCOUNTER — Other Ambulatory Visit (HOSPITAL_COMMUNITY): Payer: Self-pay | Admitting: Nurse Practitioner

## 2020-07-10 MED ORDER — SYNJARDY XR 12.5-1000 MG PO TB24: ORAL_TABLET | ORAL | 3 refills | Status: DC

## 2020-07-10 NOTE — Telephone Encounter (Signed)
Please see below and advise.    His rx was denied by his insurance company.

## 2020-07-10 NOTE — Telephone Encounter (Signed)
Switching to Synjardy XR 12.12/998, 2 tablets daily

## 2020-07-10 NOTE — Telephone Encounter (Signed)
Patient called re: Patient has been out of INVOKAMET XR [phone] MG TB24 for 4 days and is concerned that his blood sugars are going up.  Patient requests a RX for INVOKAMET XR [phone] MG TB24 be sent asap to: [id number] [address], [place] Phone:  [phone]  Fax:  [phone]

## 2020-07-10 NOTE — Telephone Encounter (Signed)
Noted, rx has been sent, and patient is aware

## 2020-07-17 ENCOUNTER — Other Ambulatory Visit: Payer: Self-pay | Admitting: *Deleted

## 2020-07-17 ENCOUNTER — Telehealth: Payer: Self-pay

## 2020-07-17 MED ORDER — SYNJARDY XR 12.5-1000 MG PO TB24
ORAL_TABLET | ORAL | 3 refills | Status: DC
Start: 2020-07-17 — End: 2021-04-29

## 2020-07-17 NOTE — Telephone Encounter (Signed)
New message    Pt c/o medication issue:  1. Name of Medication: Empagliflozin-metFORMIN HCl ER (SYNJARDY XR) 12.12-998 MG TB24  2. How are you currently taking this medication (dosage and times per day)?  Once a day   3. Are you having a reaction (difficulty breathing--STAT)? No   4. What is your medication issue? Ask for a call back from the [facility] to discuss   5. [facility] [address]

## 2020-07-17 NOTE — Telephone Encounter (Signed)
Noted, patient is aware, updated Rx has been sent

## 2020-07-17 NOTE — Telephone Encounter (Signed)
Patient called, he said he was taking 2 of the invokamet and since we switched to [facility] he's only taking 1 tablet daily. He said when he was on Synjardy before he was taking 2 tablets daily. Please advise.

## 2020-07-17 NOTE — Telephone Encounter (Signed)
He is correct, needs to be taking 2 tablets daily, please resend

## 2020-07-18 ENCOUNTER — Telehealth: Payer: Self-pay

## 2020-07-18 ENCOUNTER — Other Ambulatory Visit: Payer: Self-pay | Admitting: *Deleted

## 2020-07-18 MED ORDER — OZEMPIC (1 MG/DOSE) 2 MG/1.5ML ~~LOC~~ SOPN
1.0000 mg | PEN_INJECTOR | SUBCUTANEOUS | 1 refills | Status: DC
Start: 2020-07-18 — End: 2021-02-01

## 2020-07-18 NOTE — Telephone Encounter (Signed)
New message      1. Which medications need to be refilled? (please list name of each medication and dose if known) Semaglutide, 1 MG/DOSE, (OZEMPIC, 1 MG/DOSE,) 2 MG/1.5ML SOPN  2. Which pharmacy/location (including street and city if local pharmacy) is medication to be sent to? [facility] [address]   3. Do they need a 30 day or 90 day supply?  90 day supply

## 2020-07-18 NOTE — Telephone Encounter (Signed)
Rx sent 

## 2020-07-25 DIAGNOSIS — H35033 Hypertensive retinopathy, bilateral: Secondary | ICD-10-CM | POA: Diagnosis not present

## 2020-07-30 ENCOUNTER — Telehealth: Payer: Self-pay | Admitting: Internal Medicine

## 2020-07-30 NOTE — Telephone Encounter (Addendum)
Patient wondering if he can get a letter to excuse him from jury duty on 01.04.2022. States he is up and down to the bathroom once or twice to urinate every hour so he doesn't think he can sit long enough. Patient states he will come by and pick up letter if it is something we can do. [phone]

## 2020-08-01 NOTE — Telephone Encounter (Signed)
[place] for letter Verdis Frederickson can you do?

## 2020-08-01 NOTE — Telephone Encounter (Signed)
Sent to Dr. [name]. 

## 2020-08-03 ENCOUNTER — Other Ambulatory Visit (INDEPENDENT_AMBULATORY_CARE_PROVIDER_SITE_OTHER): Payer: BC Managed Care – PPO

## 2020-08-03 ENCOUNTER — Other Ambulatory Visit: Payer: Self-pay

## 2020-08-03 DIAGNOSIS — E782 Mixed hyperlipidemia: Secondary | ICD-10-CM

## 2020-08-03 DIAGNOSIS — E1165 Type 2 diabetes mellitus with hyperglycemia: Secondary | ICD-10-CM

## 2020-08-03 LAB — LIPID PANEL
Cholesterol: 155 mg/dL (ref 0–200)
HDL: 36.6 mg/dL — ABNORMAL LOW (ref >39.00)
LDL Cholesterol: 91 mg/dL (ref 0–99)
NonHDL: 118.89
Total CHOL/HDL Ratio: 4
Triglycerides: 137 mg/dL (ref 0.0–149.0)
VLDL: 27.4 mg/dL (ref 0.0–40.0)

## 2020-08-03 LAB — COMPREHENSIVE METABOLIC PANEL
ALT: 9 U/L (ref 0–53)
AST: 11 U/L (ref 0–37)
Albumin: 4.1 g/dL (ref 3.5–5.2)
Alkaline Phosphatase: 87 U/L (ref 39–117)
BUN: 23 mg/dL (ref 6–23)
CO2: 29 mEq/L (ref 19–32)
Calcium: 9.5 mg/dL (ref 8.4–10.5)
Chloride: 105 mEq/L (ref 96–112)
Creatinine, Ser: 1.31 mg/dL (ref 0.40–1.50)
GFR: 56.19 mL/min — ABNORMAL LOW (ref >60.00)
Glucose, Bld: 110 mg/dL — ABNORMAL HIGH (ref 70–99)
Potassium: 4.8 mEq/L (ref 3.5–5.1)
Sodium: 142 mEq/L (ref 135–145)
Total Bilirubin: 0.5 mg/dL (ref 0.2–1.2)
Total Protein: 7.3 g/dL (ref 6.0–8.3)

## 2020-08-03 LAB — HEMOGLOBIN A1C: Hgb A1c MFr Bld: 7.1 % — ABNORMAL HIGH (ref 4.6–6.5)

## 2020-08-06 ENCOUNTER — Encounter: Payer: Self-pay | Admitting: Internal Medicine

## 2020-08-17 DIAGNOSIS — R309 Painful micturition, unspecified: Secondary | ICD-10-CM | POA: Diagnosis not present

## 2020-08-17 DIAGNOSIS — R350 Frequency of micturition: Secondary | ICD-10-CM | POA: Diagnosis not present

## 2020-08-17 DIAGNOSIS — R3915 Urgency of urination: Secondary | ICD-10-CM | POA: Diagnosis not present

## 2020-08-17 DIAGNOSIS — R8271 Bacteriuria: Secondary | ICD-10-CM | POA: Diagnosis not present

## 2020-08-22 DIAGNOSIS — R338 Other retention of urine: Secondary | ICD-10-CM | POA: Diagnosis not present

## 2020-08-22 DIAGNOSIS — C61 Malignant neoplasm of prostate: Secondary | ICD-10-CM | POA: Diagnosis not present

## 2020-08-22 DIAGNOSIS — R309 Painful micturition, unspecified: Secondary | ICD-10-CM | POA: Diagnosis not present

## 2020-08-22 DIAGNOSIS — N200 Calculus of kidney: Secondary | ICD-10-CM | POA: Diagnosis not present

## 2020-08-30 ENCOUNTER — Other Ambulatory Visit: Payer: BC Managed Care – PPO

## 2020-09-03 ENCOUNTER — Ambulatory Visit (INDEPENDENT_AMBULATORY_CARE_PROVIDER_SITE_OTHER): Payer: BC Managed Care – PPO | Admitting: Endocrinology

## 2020-09-03 ENCOUNTER — Encounter: Payer: Self-pay | Admitting: Endocrinology

## 2020-09-03 ENCOUNTER — Other Ambulatory Visit: Payer: Self-pay

## 2020-09-03 VITALS — BP 132/82 | HR 104 | Ht 71.0 in | Wt 305.6 lb

## 2020-09-03 DIAGNOSIS — E782 Mixed hyperlipidemia: Secondary | ICD-10-CM | POA: Diagnosis not present

## 2020-09-03 DIAGNOSIS — R809 Proteinuria, unspecified: Secondary | ICD-10-CM

## 2020-09-03 DIAGNOSIS — E1165 Type 2 diabetes mellitus with hyperglycemia: Secondary | ICD-10-CM

## 2020-09-03 DIAGNOSIS — E1129 Type 2 diabetes mellitus with other diabetic kidney complication: Secondary | ICD-10-CM | POA: Diagnosis not present

## 2020-09-03 NOTE — Patient Instructions (Signed)
Check blood sugars on waking up 2-3 days a week  Also check blood sugars about 2 hours after meals and do this after different meals by rotation  Recommended blood sugar levels on waking up are 90-130 and about 2 hours after meal is 130-160  Please bring your blood sugar monitor to each visit, thank you   

## 2020-09-03 NOTE — Progress Notes (Signed)
Patient ID: Tommy Yang, male   DOB: [birth year], 70 y.o.   MRN: [id number]           Reason for Appointment: Follow-up     History of Present Illness:          Diagnosis: Type 2 diabetes mellitus, date of diagnosis: 05/2013       Past history:   He apparently had significant hyperglycemia in 2014 when seen in the urgent care center but did not establish with a PCP for control. He was started on treatment for his diabetes in 10/2013 and he was initially treated with metformin and Januvia A few months later he was also given glipizide ER to help his control when his A1c had gone up to 10.9 He says he was also seen by dietitian but no record available of this. Subsequently his blood sugar control had improved with A1c down to 6.8 in 9/15 On his consultation in 3/16 he had high blood sugars and weight gain; A1c was 9.1% with taking glipizide ER, Januvia and metformin.  Recent history:    Non-insulin hypoglycemic drugs the patient is taking are: Amaryl 1 mg at dinner, , Ozempic 1 mg weekly, Synjardy XR 12.12/998 2 tablets daily  His A1c is staying about the same at 7.1, previously 6.9 and 7.2   Current blood sugar patterns and problems identified:  He has had to switch from [place] to [place] because of insurance preference  Although A1c is slightly higher and his blood sugars are recently excellent and even better than the last time  He has finally lost weight but he thinks this is from decreased appetite  This is despite being on the same dose of Ozempic as before, no nausea  He had been trying to do a lot of walking until last month when he developed urinary obstruction  Portions are well controlled  Also no hypoglycemia with taking Amaryl at dinnertime  Side effects from medications have been: None  Compliance with the medical regimen: Fair  Hypoglycemia: None    Glucose monitoring:  done 1-2 times a day         Glucometer:  [place]  Blood Glucose readings by  meter report, analysis done as before  AVERAGE for 30 days 127, previously 141  PRE-meal average 95-123, average 112  After meal average 137, range 73-179  Previous highest blood sugar 182      Self-care: The diet that the patient has been following is: tries to limit portions and fats .      Meals: 3 meals per day. Breakfast is sometimes cereal,sausage  Fruits for snacks  Exercise:  A little walking   Dietician visit, most recent:?  2015 .               Weight history:    Wt Readings from Last 3 Encounters:  09/03/20 (!) 305 lb 9.6 oz (138.6 kg)  06/01/20 (!) 315 lb 3.2 oz (143 kg)  05/29/20 (!) 314 lb (142.4 kg)    Glycemic control:   Lab Results  Component Value Date   HGBA1C 7.1 (H) 08/03/2020   HGBA1C 6.9 (H) 05/18/2020   HGBA1C 7.2 (H) 02/24/2020   Lab Results  Component Value Date   MICROALBUR 5.5 05/18/2020   LDLCALC 91 08/03/2020   CREATININE 1.31 08/03/2020   Lab Results  Component Value Date   HGB 14.1 05/18/2020     No visits with results within 1 Week(s) from this visit.  Latest known visit with  results is:  Lab on 08/03/2020  Component Date Value Ref Range Status  . Cholesterol 08/03/2020 155  0 - 200 mg/dL Final   ATP III Classification       Desirable:  < 200 mg/dL               Borderline High:  200 - 239 mg/dL          High:  > = 240 mg/dL  . Triglycerides 08/03/2020 137.0  0.0 - 149.0 mg/dL Final   Normal:  <150 mg/dLBorderline High:  150 - 199 mg/dL  . HDL 08/03/2020 36.60* >39.00 mg/dL Final  . VLDL 08/03/2020 27.4  0.0 - 40.0 mg/dL Final  . LDL Cholesterol 08/03/2020 91  0 - 99 mg/dL Final  . Total CHOL/HDL Ratio 08/03/2020 4   Final                  Men          Women1/2 Average Risk     3.4          3.3Average Risk          5.0          4.42X Average Risk          9.6          7.13X Average Risk          15.0          11.0                      . NonHDL 08/03/2020 118.89   Final   NOTE:  Non-HDL goal should be 30 mg/dL higher than  patient's LDL goal (i.e. LDL goal of < 70 mg/dL, would have non-HDL goal of < 100 mg/dL)  . Sodium 08/03/2020 142  135 - 145 mEq/L Final  . Potassium 08/03/2020 4.8  3.5 - 5.1 mEq/L Final  . Chloride 08/03/2020 105  96 - 112 mEq/L Final  . CO2 08/03/2020 29  19 - 32 mEq/L Final  . Glucose, Bld 08/03/2020 110* 70 - 99 mg/dL Final  . BUN 08/03/2020 23  6 - 23 mg/dL Final  . Creatinine, Ser 08/03/2020 1.31  0.40 - 1.50 mg/dL Final  . Total Bilirubin 08/03/2020 0.5  0.2 - 1.2 mg/dL Final  . Alkaline Phosphatase 08/03/2020 87  39 - 117 U/L Final  . AST 08/03/2020 11  0 - 37 U/L Final  . ALT 08/03/2020 9  0 - 53 U/L Final  . Total Protein 08/03/2020 7.3  6.0 - 8.3 g/dL Final  . Albumin 08/03/2020 4.1  3.5 - 5.2 g/dL Final  . GFR 08/03/2020 56.19* >60.00 mL/min Final   Calculated using the CKD-EPI Creatinine Equation (2021)  . Calcium 08/03/2020 9.5  8.4 - 10.5 mg/dL Final  . Hgb A1c MFr Bld 08/03/2020 7.1* 4.6 - 6.5 % Final   Glycemic Control Guidelines for People with Diabetes:Non Diabetic:  <6%Goal of Therapy: <7%Additional Action Suggested:  >8%       Allergies as of 09/03/2020   No Known Allergies     Medication List       Accurate as of September 03, 2020  8:16 AM. If you have any questions, ask your nurse or doctor.        atorvastatin 80 MG tablet Commonly known as: LIPITOR Take 1 tablet (80 mg total) by mouth daily.   blood glucose meter kit and supplies Kit Test blood sugar daily as  directed. Dx code: E11.9   CALCIUM PO Take 1,000 tablets by mouth daily.   CINNAMON PO Take 500 mg by mouth daily.   diltiazem 30 MG tablet Commonly known as: CARDIZEM TAKE 1 TABLET EVERY 4 HOURS AS NEEDED FOR HR >100   furosemide 20 MG tablet Commonly known as: LASIX TAKE 1 TABLET (20 MG TOTAL) BY MOUTH DAILY AS NEEDED FOR EDEMA.   glimepiride 1 MG tablet Commonly known as: AMARYL TAKE 1 TABLET (1 MG TOTAL) BY MOUTH DAILY WITH BREAKFAST.   glucose blood test strip Commonly known  as: Accu-Chek Guide Use as instructed to check blood sugar twice daily.   Invokamet XR [phone] MG Tb24 Generic drug: Canagliflozin-metFORMIN HCl ER TAKE 2 TABLETS BY MOUTH EVERY DAY   leuprolide 11.25 MG injection Commonly known as: LUPRON Inject 11.25 mg into the muscle every 6 (six) months.   losartan 50 MG tablet Commonly known as: COZAAR TAKE 1 TABLET BY MOUTH EVERY DAY   metoprolol tartrate 100 MG tablet Commonly known as: LOPRESSOR TAKE 1 TABLET BY MOUTH TWICE A DAY   Ozempic (1 MG/DOSE) 2 MG/1.5ML Sopn Generic drug: Semaglutide (1 MG/DOSE) Inject 1 mg into the skin once a week. fridays   pantoprazole 40 MG tablet Commonly known as: PROTONIX TAKE 1 TABLET BY MOUTH EVERY DAY   Synjardy XR 12.12-998 MG Tb24 Generic drug: Empagliflozin-metFORMIN HCl ER Take 2 tablets daily   tamsulosin 0.4 MG Caps capsule Commonly known as: FLOMAX Take 1 capsule (0.4 mg total) by mouth daily. What changed: when to take this   Vitamin D (Cholecalciferol) 10 MCG (400 UNIT) Tabs Take 400 Units by mouth daily.   Xarelto 20 MG Tabs tablet Generic drug: rivaroxaban TAKE 1 TABLET (20 MG TOTAL) BY MOUTH DAILY BEFORE BREAKFAST.   zinc gluconate 50 MG tablet Take 50 mg by mouth daily.       Allergies:  No Known Allergies  Past Medical History:  Diagnosis Date  . Arthritis   . Atrial fibrillation ([place])   . Diabetes mellitus without complication ([place])    type 2  . DVT (deep venous thrombosis) ([place]) 2015   left leg.  . Foley catheter in place last changed 2eeks ago   last 7 months  . GERD (gastroesophageal reflux disease)   . Hx of radiation therapy   . Hypercholesterolemia   . Hypertension   . Prostate cancer ([place]) 12/06/13   gleason 4+3=7, 6/12 cores positive. seed implant and radiation  . Sleep apnea    no cpap used  . Urinary retention     Past Surgical History:  Procedure Laterality Date  . APPENDECTOMY    . BALLOON DILATION N/A 08/02/2015   Procedure: BALLOON  DILATION;  Surgeon: [name], MD;  Location: [facility];  Service: Endoscopy;  Laterality: N/A;  . CARDIOVERSION N/A 05/13/2018   Procedure: CARDIOVERSION;  Surgeon: Sanda Klein, MD;  Location: [facility];  Service: Cardiovascular;  Laterality: N/A;  . CARDIOVERSION N/A 05/18/2018   Procedure: CARDIOVERSION;  Surgeon: Buford Dresser, MD;  Location: [facility];  Service: Cardiovascular;  Laterality: N/A;  . COLONOSCOPY WITH PROPOFOL N/A 08/02/2015   Procedure: COLONOSCOPY WITH PROPOFOL w/ APC;  Surgeon: [name], MD;  Location: [facility];  Service: Endoscopy;  Laterality: N/A;  . ESOPHAGOGASTRODUODENOSCOPY (EGD) WITH PROPOFOL N/A 08/02/2015   Procedure: ESOPHAGOGASTRODUODENOSCOPY (EGD) WITH PROPOFOL/ possible dilation.;  Surgeon: [name], MD;  Location: [facility];  Service: Endoscopy;  Laterality: N/A;  . KNEE SURGERY  right knee orthoscopic  . PROSTATE BIOPSY  12/06/13   Gleason 4+3=7, volume 35 gm  . [place]   Tourette'ssyndrome spinal fluid removed   . TONSILLECTOMY  as child  . TRANSURETHRAL RESECTION OF PROSTATE N/A 09/30/2019   Procedure: TRANSURETHRAL RESECTION OF THE PROSTATE (TURP);  Surgeon: Alexis Frock, MD;  Location: [facility];  Service: Urology;  Laterality: N/A;  1 HR    Family History  Problem Relation Age of Onset  . Heart disease Mother        before age 12  . Diabetes Mother   . Hyperlipidemia Mother   . Varicose Veins Mother   . Hypertension Mother   . Heart disease Father   . Diabetes Father   . Hyperlipidemia Father   . Hypertension Father   . Cancer Brother   . COPD Brother   . Diabetes Brother   . Hyperlipidemia Brother   . Hypertension Brother     Social History:  reports that he quit smoking about 8 years ago. His smoking use included cigarettes. He has a 11.50 pack-year smoking history. He has never used smokeless tobacco. He reports current alcohol use. He reports that  he does not use drugs.    Review of Systems        HYPERLIPIDEMIA: he has had significant hyperlipidemia and has been treated with Lipitor 80 mg daily Previously on Vascepa for high triglycerides but he felt that it would cause atrial fibrillation an hour after taking the medicine in the evening and he stopped this Lipitor increased on the last visit with improvement in LDL  His diet is usually low in fat  Triglycerides are still normal despite stopping Vascepa HDL usually low Has no history of CAD       Lab Results  Component Value Date   CHOL 155 08/03/2020   CHOL 170 05/18/2020   CHOL 144 11/18/2019   Lab Results  Component Value Date   HDL 36.60 (L) 08/03/2020   HDL 37 (L) 05/18/2020   HDL 37.30 (L) 11/18/2019   Lab Results  Component Value Date   LDLCALC 91 08/03/2020   LDLCALC 107 (H) 05/18/2020   LDLCALC 83 11/18/2019   Lab Results  Component Value Date   TRIG 137.0 08/03/2020   TRIG 144 05/18/2020   TRIG 117.0 11/18/2019   Lab Results  Component Value Date   CHOLHDL 4 08/03/2020   CHOLHDL 4.6 05/18/2020   CHOLHDL 4 11/18/2019   Lab Results  Component Value Date   LDLDIRECT 93.0 12/30/2016   LDLDIRECT 103.0 02/08/2015                HYPERTENSION: Mild and has been treated with losartan 50 mg and low-dose metoprolol, followed by PCP and cardiologist   BP Readings from Last 3 Encounters:  09/03/20 132/82  06/01/20 118/78  05/29/20 126/80    RENAL function is minimally higher, he has had some issues with outlet obstruction  Lab Results  Component Value Date   CREATININE 1.31 08/03/2020   CREATININE 1.17 05/18/2020   CREATININE 1.15 02/24/2020    Followed by cardiologist for atrial fibrillation  Taking B12 for low normal vitamin B12 level  LABS:  No visits with results within 1 Week(s) from this visit.  Latest known visit with results is:  Lab on 08/03/2020  Component Date Value Ref Range Status  . Cholesterol 08/03/2020 155  0 -  200 mg/dL Final   ATP III Classification  Desirable:  < 200 mg/dL               Borderline High:  200 - 239 mg/dL          High:  > = 217 mg/dL  . Triglycerides 08/03/2020 137.0  0.0 - 149.0 mg/dL Final   Normal:  <116 mg/dLBorderline High:  150 - 199 mg/dL  . HDL 08/03/2020 36.60* >39.00 mg/dL Final  . VLDL 54/61/2432 27.4  0.0 - 40.0 mg/dL Final  . LDL Cholesterol 08/03/2020 91  0 - 99 mg/dL Final  . Total CHOL/HDL Ratio 08/03/2020 4   Final                  Men          Women1/2 Average Risk     3.4          3.3Average Risk          5.0          4.42X Average Risk          9.6          7.13X Average Risk          15.0          11.0                      . NonHDL 08/03/2020 118.89   Final   NOTE:  Non-HDL goal should be 30 mg/dL higher than patient's LDL goal (i.e. LDL goal of < 70 mg/dL, would have non-HDL goal of < 100 mg/dL)  . Sodium 08/03/2020 142  135 - 145 mEq/L Final  . Potassium 08/03/2020 4.8  3.5 - 5.1 mEq/L Final  . Chloride 08/03/2020 105  96 - 112 mEq/L Final  . CO2 08/03/2020 29  19 - 32 mEq/L Final  . Glucose, Bld 08/03/2020 110* 70 - 99 mg/dL Final  . BUN 75/56/2392 23  6 - 23 mg/dL Final  . Creatinine, Ser 08/03/2020 1.31  0.40 - 1.50 mg/dL Final  . Total Bilirubin 08/03/2020 0.5  0.2 - 1.2 mg/dL Final  . Alkaline Phosphatase 08/03/2020 87  39 - 117 U/L Final  . AST 08/03/2020 11  0 - 37 U/L Final  . ALT 08/03/2020 9  0 - 53 U/L Final  . Total Protein 08/03/2020 7.3  6.0 - 8.3 g/dL Final  . Albumin 15/15/8265 4.1  3.5 - 5.2 g/dL Final  . GFR 87/18/4108 56.19* >60.00 mL/min Final   Calculated using the CKD-EPI Creatinine Equation (2021)  . Calcium 08/03/2020 9.5  8.4 - 10.5 mg/dL Final  . Hgb J7H MFr Bld 08/03/2020 7.1* 4.6 - 6.5 % Final   Glycemic Control Guidelines for People with Diabetes:Non Diabetic:  <6%Goal of Therapy: <7%Additional Action Suggested:  >8%     Physical Examination:  BP 132/82   Pulse (!) 104   Ht 5\' 11"  (1.803 m)   Wt (!) 305 lb 9.6 oz  (138.6 kg)   SpO2 98%   BMI 42.62 kg/m   Diabetes type 2, morbidly obese, non-insulin-dependent   See history of present illness for detailed discussion of his current management, blood sugar patterns and problems identified   His A1c is relatively higher at 7.1   Currently on 1 mg Ozempic, Synjardy XR and low-dose Amaryl Even with his A1c being slightly higher than before his home blood sugars are near normal and consistently within target with some readings before and after meals He has lost  weight but has had some decreased appetite of unclear etiology  No side effects from medications although creatinine is high normal compared to before  HYPERTENSION: well controlled  History of microalbuminuria: Will need follow-up in the next visit, may consider increasing losartan  LIPIDS: LDL is 91 compared to 107 and benefiting from higher dose of Lipitor  Low normal B12 level: Continue supplements  PLAN:  Continue same medications for diabetes Check blood sugars alternating before breakfast and after meals Resume walking when able to wean To call if he is getting any unusually high or low readings  To stay on 80 mg Lipitor  There are no Patient Instructions on file for this visit.      Elayne Snare 09/03/2020, 8:16 AM   Note: This office note was prepared with Dragon voice recognition system technology. Any transcriptional errors that result from this process are unintentional.

## 2020-09-04 DIAGNOSIS — C61 Malignant neoplasm of prostate: Secondary | ICD-10-CM | POA: Diagnosis not present

## 2020-09-04 DIAGNOSIS — B952 Enterococcus as the cause of diseases classified elsewhere: Secondary | ICD-10-CM | POA: Diagnosis not present

## 2020-09-04 DIAGNOSIS — N390 Urinary tract infection, site not specified: Secondary | ICD-10-CM | POA: Diagnosis not present

## 2020-09-04 DIAGNOSIS — N130 Hydronephrosis with ureteropelvic junction obstruction: Secondary | ICD-10-CM | POA: Diagnosis not present

## 2020-09-04 DIAGNOSIS — R338 Other retention of urine: Secondary | ICD-10-CM | POA: Diagnosis not present

## 2020-09-06 ENCOUNTER — Ambulatory Visit (HOSPITAL_COMMUNITY): Payer: BC Managed Care – PPO | Admitting: Nurse Practitioner

## 2020-09-14 ENCOUNTER — Other Ambulatory Visit: Payer: Self-pay

## 2020-09-14 ENCOUNTER — Ambulatory Visit (HOSPITAL_COMMUNITY)
Admission: RE | Admit: 2020-09-14 | Discharge: 2020-09-14 | Disposition: A | Discharge status: Home / Self Care | Payer: BC Managed Care – PPO | Source: Ambulatory Visit | Attending: Nurse Practitioner | Admitting: Nurse Practitioner

## 2020-09-14 ENCOUNTER — Encounter (HOSPITAL_COMMUNITY): Payer: Self-pay | Admitting: Nurse Practitioner

## 2020-09-14 VITALS — BP 106/64 | HR 68 | Ht 71.0 in | Wt 304.4 lb

## 2020-09-14 DIAGNOSIS — Z8546 Personal history of malignant neoplasm of prostate: Secondary | ICD-10-CM | POA: Insufficient documentation

## 2020-09-14 DIAGNOSIS — I10 Essential (primary) hypertension: Secondary | ICD-10-CM | POA: Insufficient documentation

## 2020-09-14 DIAGNOSIS — N390 Urinary tract infection, site not specified: Secondary | ICD-10-CM | POA: Insufficient documentation

## 2020-09-14 DIAGNOSIS — Z7901 Long term (current) use of anticoagulants: Secondary | ICD-10-CM | POA: Insufficient documentation

## 2020-09-14 DIAGNOSIS — Z87891 Personal history of nicotine dependence: Secondary | ICD-10-CM | POA: Insufficient documentation

## 2020-09-14 DIAGNOSIS — Z79899 Other long term (current) drug therapy: Secondary | ICD-10-CM | POA: Diagnosis not present

## 2020-09-14 DIAGNOSIS — I4821 Permanent atrial fibrillation: Secondary | ICD-10-CM | POA: Insufficient documentation

## 2020-09-14 DIAGNOSIS — E7800 Pure hypercholesterolemia, unspecified: Secondary | ICD-10-CM | POA: Diagnosis not present

## 2020-09-14 DIAGNOSIS — D6869 Other thrombophilia: Secondary | ICD-10-CM

## 2020-09-14 NOTE — Progress Notes (Addendum)
Date:  09/14/2020   ID:  [name], DOB 06-May-1951, MRN [id number]  Location: home   Provider location: [address] [place], [place] [address] Evaluation Performed:Follow up   PCP:  [name], MD  Primary Cardiologist:  None  Primary Electrophysiologist: none   TX:MIWOEH fibrillation   History of Present Illness: Tommy Yang is a 70 y.o. male who presents for f/u today. He has longstanding persistent afib. He feels he may have had afib for 2 years befor he was diagnosed.  On previous visits, he has been offered AAD therapy which he has deferred as he feels he is minimally symptomatic. He does have some snoring. Possible apnea, but  pt states he would not be able to deal with a mask to treat sleep apnea. He scratched  his lower leg and being diabetic, has an appointment with the wound center to make sure it is healing.  6 month f/u in afib clinic, 09/03/19. He is in rate controlled afib. He reports going to ER  In September after the flu shot with afib with RVR. He was rate controlled with IV Cardizem and discharged home on same meds. He is having issues now with urinary retention with h/o prostate CA. He is wearing a urinary catheter. He is due to go back to urologist tomorrow for removal but if he can not urinate , he was told he will have to have surgery. He is having intermittent hematuria now. His prior leg wound did heal. Continues on xarelto with a CHA2DS2VASc score of 5.he is on tumeric and fish oil which may be contributing to hematuria.  In afib clinic, 10/05/19. He had post chanel TURP on 09/30/19 and had to go back to ER yesterday  to have foley reinserted after not being able to void since it was removed earlier that am. Urine had clots in the ER but was irrigated to pink color and remains that way today. He is back on xarelto. Pt asked to be seen today after increase in HR into the 120-130's and has noted swelling in his lower extremities. He does not describe   any PND/orthopnea. He feels nervous with the increase of HR and short of breath with exertion.[name] overall weight is down one lb today but his LE's are swollen.  F/u in afib clinic, 10/10/19. He feels better with lasix and addition of Cardizem to slow HR. He took lasix x 5 days and now his weight is back down to his normal. He will stay on Cardizem. Bmet drawn today.He continues with foley cath with pinkish urine and sees urologist tomorrow.   F/u 11/08/19. His catheter has been removed and he is doing much better with voiding. No recent hematuria. His heart rate is now controlled afib back in the  80's and he stopped taking the diltiazem daily and went back on amlodipine. BP well controlled.   F/u in afib clinic, 09/14/20. He continues in permanent afib, rate controlled at 68 bpm. . He has developed some dysuria and has the foley cath with leg bag back in.  No hematuria. He did have some infection and finishes antibiotic tomorrow. Otherwise no complaints.   Today, he denies symptoms of palpitations, chest pain, shortness of breath, orthopnea, PND, lower extremity edema, claudication, dizziness, presyncope, syncope, bleeding, or neurologic sequela. The patient is tolerating medications without difficulties and is otherwise without complaint today.   he denies symptoms of cough, fevers, chills, or new SOB worrisome for  COVID 19.    Filed Weights   09/14/20 0836  Weight: (!) 138.1 kg    Past Medical History:  Diagnosis Date  . Arthritis   . Atrial fibrillation ([place])   . Diabetes mellitus without complication ([place])    type 2  . DVT (deep venous thrombosis) ([place]) 2015   left leg.  . Foley catheter in place last changed 2eeks ago   last 7 months  . GERD (gastroesophageal reflux disease)   . Hx of radiation therapy   . Hypercholesterolemia   . Hypertension   . Prostate cancer ([place]) 12/06/13   gleason 4+3=7, 6/12 cores positive. seed implant and radiation  . Sleep apnea    no cpap used  .  Urinary retention    Past Surgical History:  Procedure Laterality Date  . APPENDECTOMY    . BALLOON DILATION N/A 08/02/2015   Procedure: BALLOON DILATION;  Surgeon: [name], MD;  Location: [facility];  Service: Endoscopy;  Laterality: N/A;  . CARDIOVERSION N/A 05/13/2018   Procedure: CARDIOVERSION;  Surgeon: Sanda Klein, MD;  Location: [facility];  Service: Cardiovascular;  Laterality: N/A;  . CARDIOVERSION N/A 05/18/2018   Procedure: CARDIOVERSION;  Surgeon: Buford Dresser, MD;  Location: [facility];  Service: Cardiovascular;  Laterality: N/A;  . COLONOSCOPY WITH PROPOFOL N/A 08/02/2015   Procedure: COLONOSCOPY WITH PROPOFOL w/ APC;  Surgeon: [name], MD;  Location: [facility];  Service: Endoscopy;  Laterality: N/A;  . ESOPHAGOGASTRODUODENOSCOPY (EGD) WITH PROPOFOL N/A 08/02/2015   Procedure: ESOPHAGOGASTRODUODENOSCOPY (EGD) WITH PROPOFOL/ possible dilation.;  Surgeon: [name], MD;  Location: [facility];  Service: Endoscopy;  Laterality: N/A;  . KNEE SURGERY     right knee orthoscopic  . PROSTATE BIOPSY  12/06/13   Gleason 4+3=7, volume 35 gm  . [place]   Tourette'ssyndrome spinal fluid removed   . TONSILLECTOMY  as child  . TRANSURETHRAL RESECTION OF PROSTATE N/A 09/30/2019   Procedure: TRANSURETHRAL RESECTION OF THE PROSTATE (TURP);  Surgeon: Alexis Frock, MD;  Location: [facility];  Service: Urology;  Laterality: N/A;  1 HR     Current Outpatient Medications  Medication Sig Dispense Refill  . atorvastatin (LIPITOR) 80 MG tablet Take 1 tablet (80 mg total) by mouth daily. 90 tablet 3  . blood glucose meter kit and supplies KIT Test blood sugar daily as directed. Dx code: E11.9 1 each 0  . CALCIUM PO Take 1,000 tablets by mouth daily.     [name] CINNAMON PO Take 500 mg by mouth daily.     [name] diltiazem (CARDIZEM) 30 MG tablet TAKE 1 TABLET EVERY 4 HOURS AS NEEDED FOR HR >100 45 tablet 1  .  Empagliflozin-metFORMIN HCl ER (SYNJARDY XR) 12.12-998 MG TB24 Take 2 tablets daily 180 tablet 3  . furosemide (LASIX) 20 MG tablet TAKE 1 TABLET (20 MG TOTAL) BY MOUTH DAILY AS NEEDED FOR EDEMA. 90 tablet 0  . glimepiride (AMARYL) 1 MG tablet TAKE 1 TABLET (1 MG TOTAL) BY MOUTH DAILY WITH BREAKFAST. 90 tablet 1  . glucose blood (ACCU-CHEK GUIDE) test strip Use as instructed to check blood sugar twice daily. 100 each 3  . leuprolide (LUPRON) 11.25 MG injection Inject 11.25 mg into the muscle every 6 (six) months.     [name] losartan (COZAAR) 50 MG tablet TAKE 1 TABLET BY MOUTH EVERY DAY 90 tablet 1  . metoprolol tartrate (LOPRESSOR) 100 MG tablet TAKE 1 TABLET BY MOUTH TWICE A DAY 180 tablet  2  . pantoprazole (PROTONIX) 40 MG tablet TAKE 1 TABLET BY MOUTH EVERY DAY 90 tablet 3  . Semaglutide, 1 MG/DOSE, (OZEMPIC, 1 MG/DOSE,) 2 MG/1.5ML SOPN Inject 1 mg into the skin once a week. fridays 4.5 mL 1  . sulfamethoxazole-trimethoprim (BACTRIM DS) 800-160 MG tablet Take 1 tablet by mouth 2 (two) times daily.    . tamsulosin (FLOMAX) 0.4 MG CAPS capsule Take 1 capsule (0.4 mg total) by mouth daily. (Patient taking differently: Take 0.4 mg by mouth 2 (two) times daily.) 30 capsule 30  . Vitamin D, Cholecalciferol, 400 UNITS TABS Take 400 Units by mouth daily.    Alveda Reasons 20 MG TABS tablet TAKE 1 TABLET (20 MG TOTAL) BY MOUTH DAILY BEFORE BREAKFAST. 90 tablet 3  . zinc gluconate 50 MG tablet Take 50 mg by mouth daily.     No current facility-administered medications for this encounter.    Allergies:   Patient has no known allergies.   Social History:  The patient  reports that he quit smoking about 8 years ago. His smoking use included cigarettes. He has a 11.50 pack-year smoking history. He has never used smokeless tobacco. He reports current alcohol use. He reports that he does not use drugs.   Family History:  The patient's  family history includes COPD in his brother; Cancer in his brother; Diabetes in  his brother, father, and mother; Heart disease in his father and mother; Hyperlipidemia in his brother, father, and mother; Hypertension in his brother, father, and mother; Varicose Veins in his mother.    ROS:  Please see the history of present illness.   All other systems are personally reviewed and negative.   Exam:General:  No distress Eyes: EOMI, anicteric ENT: Oral Mucosa clear and moist Cardiovascular: regular rhythm, no murmurs, rubs or gallops, no edema, Respiratory: Normal respiratory effort on room air, lungs clear to auscultation bilaterally Abdomen: soft, non-distended, non-tender, normal bowel sounds. Wearing a urinary catheter with blood tinged urine. Skin: No Rash Neurologic: Grossly no focal neuro deficit.Mental status AAOx3, speech normal, Psychiatric:Appropriate affect, and mood   Recent Labs: 05/18/2020: Hemoglobin 14.1; Platelets 243; TSH 1.12 08/03/2020: ALT 9; BUN 23; Creatinine, Ser 1.31; Potassium 4.8; Sodium 142  personally reviewed    Other studies personally reviewed: Epic records reviewed EKG- afib at 68 bpm, qrs int 80 ms, qtc 397 ms      ASSESSMENT AND PLAN:  1. Permanent  atrial fibrillation Rate controlled and minimally asymptomatic Has short acting cardizem 30 mg if needed Continue metoprolol tartrate 100 mg bid   Pt has  deferred  AAD therapy Continue xarelto 20 mg daily  This patients CHA2DS2-VASc Score and unadjusted Ischemic Stroke Rate (% per year) is equal to 7.2 % stroke rate/year from a score of 5  2.HTN Off amlodipine for soft BP's Stable   3. Dysuria /uti Wearing  foley cath  Per urology Finishing antibiotics tomorrow   Follow-up: in July  Current medicines are reviewed at length with the patient today.   The patient does not have concerns regarding his medicines.  The following changes were made today:  none  Labs/ tests ordered today include:none Orders Placed This Encounter  Procedures  . EKG 12-Lead       Signed, Roderic Palau, NP 09/14/2020 8:37 AM  [facility] [address] [place], [place] [address] [phone]

## 2020-09-25 DIAGNOSIS — B952 Enterococcus as the cause of diseases classified elsewhere: Secondary | ICD-10-CM | POA: Diagnosis not present

## 2020-09-25 DIAGNOSIS — C778 Secondary and unspecified malignant neoplasm of lymph nodes of multiple regions: Secondary | ICD-10-CM | POA: Diagnosis not present

## 2020-09-25 DIAGNOSIS — R338 Other retention of urine: Secondary | ICD-10-CM | POA: Diagnosis not present

## 2020-09-25 DIAGNOSIS — N200 Calculus of kidney: Secondary | ICD-10-CM | POA: Diagnosis not present

## 2020-09-25 DIAGNOSIS — N390 Urinary tract infection, site not specified: Secondary | ICD-10-CM | POA: Diagnosis not present

## 2020-09-25 DIAGNOSIS — C61 Malignant neoplasm of prostate: Secondary | ICD-10-CM | POA: Diagnosis not present

## 2020-09-27 ENCOUNTER — Other Ambulatory Visit: Payer: Self-pay | Admitting: Urology

## 2020-10-01 ENCOUNTER — Telehealth: Payer: Self-pay | Admitting: Endocrinology

## 2020-10-01 NOTE — Telephone Encounter (Signed)
Pt called regarding a question on his medication. Please call at [phone]

## 2020-10-02 NOTE — Telephone Encounter (Signed)
Message left for patient to return call.

## 2020-10-02 NOTE — Telephone Encounter (Signed)
Patient returned call while you were at lunch - he asked if you could try him back when you are available. Ph# [phone] 

## 2020-10-03 ENCOUNTER — Other Ambulatory Visit (HOSPITAL_COMMUNITY)
Admission: RE | Admit: 2020-10-03 | Discharge: 2020-10-03 | Disposition: A | Discharge status: Home / Self Care | Payer: BC Managed Care – PPO | Source: Ambulatory Visit | Attending: Urology | Admitting: Urology

## 2020-10-03 DIAGNOSIS — Z01812 Encounter for preprocedural laboratory examination: Secondary | ICD-10-CM | POA: Diagnosis not present

## 2020-10-03 DIAGNOSIS — Z20822 Contact with and (suspected) exposure to covid-19: Secondary | ICD-10-CM | POA: Diagnosis not present

## 2020-10-03 LAB — SARS CORONAVIRUS 2 (TAT 6-24 HRS): SARS Coronavirus 2: NEGATIVE

## 2020-10-03 NOTE — Telephone Encounter (Signed)
Patient returned call while you were at lunch - he asked if you could try him back when you are available. Ph# [phone]

## 2020-10-03 NOTE — Telephone Encounter (Signed)
Patient received wrong amount at pharmacy, they do have the correct one for him to take 2 tablets daily

## 2020-10-04 ENCOUNTER — Other Ambulatory Visit: Payer: Self-pay

## 2020-10-04 ENCOUNTER — Encounter (HOSPITAL_BASED_OUTPATIENT_CLINIC_OR_DEPARTMENT_OTHER): Payer: Self-pay | Admitting: Urology

## 2020-10-04 NOTE — Progress Notes (Addendum)
Spoke w/ via phone for pre-op interview---pt Lab needs dos i stat  --lov donna carroll no cardiology 09-14-2020 epic, echo 05-02-2019 epic, ekg 09-14-2020 epic Arrive 700 am 10-05-2020 PO after MN NO Solid Food.  Clear liquids from MN until---600 am then npo Medications to take morning of surgery -----lipitor, metoprolol tartrate, tamsulosin, pantaprazole Diabetic medication -----none day of surgery Patient Special Instructions -----note to stop xarelto donna carroll np  3 days before surgery on chart last xarelto dose 10-02-2020 per pt Pre-Op special Istructions -----pt given overnight stay instructions Patient verbalized understanding of instructions that were given at this phone interview. Patient denies shortness of breath, chest pain, fever, cough at this phone interview.

## 2020-10-04 NOTE — Anesthesia Preprocedure Evaluation (Addendum)
Anesthesia Evaluation  Patient identified by MRN, date of birth, ID band Patient awake    Reviewed: Allergy & Precautions, H&P , NPO status , Patient's Chart, lab work & pertinent test results  Airway Mallampati: II   Neck ROM: full    Dental   Pulmonary sleep apnea , former smoker,    breath sounds clear to auscultation       Cardiovascular hypertension, + dysrhythmias (on xarelto) Atrial Fibrillation  Rhythm:regular Rate:Normal  Atrial fibrillation Abnormal ECG No significant change since last tracing Confirmed by [place], Peter [phone]) on 09/14/2020 1:19:36 PM   Neuro/Psych PSYCHIATRIC DISORDERS Tourette's syndrome   GI/Hepatic GERD  ,  Endo/Other  diabetes, Type 2, Insulin DependentMorbid obesity  Renal/GU    Prostate CA Urinary retention    Musculoskeletal  (+) Arthritis ,   Abdominal   Peds  Hematology   Anesthesia Other Findings   Reproductive/Obstetrics negative OB ROS                          Anesthesia Physical  Anesthesia Plan  ASA: III  Anesthesia Plan: General   Post-op Pain Management:    Induction: Intravenous  PONV Risk Score and Plan: 2 and Ondansetron, Dexamethasone, Midazolam and Treatment may vary due to age or medical condition  Airway Management Planned: Oral ETT  Additional Equipment:   Intra-op Plan:   Post-operative Plan: Extubation in OR  Informed Consent: I have reviewed the patients History and Physical, chart, labs and discussed the procedure including the risks, benefits and alternatives for the proposed anesthesia with the patient or authorized representative who has indicated his/her understanding and acceptance.     Dental advisory given  Plan Discussed with: CRNA and Anesthesiologist  Anesthesia Plan Comments: (Patient had coffee with cream at 0300. Will plan for GETA/RSI at 0900. )       Anesthesia Quick Evaluation

## 2020-10-05 ENCOUNTER — Encounter (HOSPITAL_BASED_OUTPATIENT_CLINIC_OR_DEPARTMENT_OTHER): Payer: Self-pay | Admitting: Urology

## 2020-10-05 ENCOUNTER — Encounter (HOSPITAL_BASED_OUTPATIENT_CLINIC_OR_DEPARTMENT_OTHER)
Admission: RE | Disposition: A | Discharge status: Home / Self Care | Payer: Self-pay | Source: Home / Self Care | Attending: Urology

## 2020-10-05 ENCOUNTER — Ambulatory Visit (HOSPITAL_BASED_OUTPATIENT_CLINIC_OR_DEPARTMENT_OTHER): Payer: BC Managed Care – PPO | Admitting: Anesthesiology

## 2020-10-05 ENCOUNTER — Other Ambulatory Visit: Payer: Self-pay

## 2020-10-05 ENCOUNTER — Ambulatory Visit (HOSPITAL_BASED_OUTPATIENT_CLINIC_OR_DEPARTMENT_OTHER)
Admission: RE | Admit: 2020-10-05 | Discharge: 2020-10-06 | Disposition: A | Discharge status: Home / Self Care | Payer: BC Managed Care – PPO | Attending: Urology | Admitting: Urology

## 2020-10-05 DIAGNOSIS — Z7901 Long term (current) use of anticoagulants: Secondary | ICD-10-CM | POA: Diagnosis not present

## 2020-10-05 DIAGNOSIS — Z8546 Personal history of malignant neoplasm of prostate: Secondary | ICD-10-CM | POA: Diagnosis not present

## 2020-10-05 DIAGNOSIS — I10 Essential (primary) hypertension: Secondary | ICD-10-CM | POA: Diagnosis not present

## 2020-10-05 DIAGNOSIS — Z87891 Personal history of nicotine dependence: Secondary | ICD-10-CM | POA: Diagnosis not present

## 2020-10-05 DIAGNOSIS — E119 Type 2 diabetes mellitus without complications: Secondary | ICD-10-CM | POA: Insufficient documentation

## 2020-10-05 DIAGNOSIS — Z923 Personal history of irradiation: Secondary | ICD-10-CM | POA: Diagnosis not present

## 2020-10-05 DIAGNOSIS — Z7984 Long term (current) use of oral hypoglycemic drugs: Secondary | ICD-10-CM | POA: Insufficient documentation

## 2020-10-05 DIAGNOSIS — R339 Retention of urine, unspecified: Secondary | ICD-10-CM | POA: Insufficient documentation

## 2020-10-05 DIAGNOSIS — I480 Paroxysmal atrial fibrillation: Secondary | ICD-10-CM | POA: Diagnosis not present

## 2020-10-05 DIAGNOSIS — C61 Malignant neoplasm of prostate: Secondary | ICD-10-CM | POA: Diagnosis not present

## 2020-10-05 DIAGNOSIS — Z79899 Other long term (current) drug therapy: Secondary | ICD-10-CM | POA: Insufficient documentation

## 2020-10-05 DIAGNOSIS — C799 Secondary malignant neoplasm of unspecified site: Secondary | ICD-10-CM | POA: Insufficient documentation

## 2020-10-05 DIAGNOSIS — Z792 Long term (current) use of antibiotics: Secondary | ICD-10-CM | POA: Insufficient documentation

## 2020-10-05 DIAGNOSIS — R338 Other retention of urine: Secondary | ICD-10-CM | POA: Diagnosis not present

## 2020-10-05 DIAGNOSIS — E785 Hyperlipidemia, unspecified: Secondary | ICD-10-CM | POA: Diagnosis not present

## 2020-10-05 HISTORY — DX: Tourette's disorder: F95.2

## 2020-10-05 HISTORY — PX: CYSTOSCOPY: SHX5120

## 2020-10-05 HISTORY — DX: Presence of spectacles and contact lenses: Z97.3

## 2020-10-05 HISTORY — PX: TRANSURETHRAL RESECTION OF PROSTATE: SHX73

## 2020-10-05 HISTORY — DX: Other complications of anesthesia, initial encounter: T88.59XA

## 2020-10-05 LAB — POCT I-STAT, CHEM 8
BUN: 22 mg/dL (ref 8–23)
Calcium, Ion: 1.27 mmol/L (ref 1.15–1.40)
Chloride: 109 mmol/L (ref 98–111)
Creatinine, Ser: 1.2 mg/dL (ref 0.61–1.24)
Glucose, Bld: 118 mg/dL — ABNORMAL HIGH (ref 70–99)
HCT: 43 % (ref 39.0–52.0)
Hemoglobin: 14.6 g/dL (ref 13.0–17.0)
Potassium: 4.7 mmol/L (ref 3.5–5.1)
Sodium: 141 mmol/L (ref 135–145)
TCO2: 23 mmol/L (ref 22–32)

## 2020-10-05 LAB — GLUCOSE, CAPILLARY
Glucose-Capillary: 104 mg/dL — ABNORMAL HIGH (ref 70–99)
Glucose-Capillary: 198 mg/dL — ABNORMAL HIGH (ref 70–99)
Glucose-Capillary: 151 mg/dL — ABNORMAL HIGH (ref 70–99)
Glucose-Capillary: 162 mg/dL — ABNORMAL HIGH (ref 70–99)

## 2020-10-05 SURGERY — TURP (TRANSURETHRAL RESECTION OF PROSTATE)
Anesthesia: General | Site: Prostate

## 2020-10-05 MED ORDER — ONDANSETRON HCL 4 MG/2ML IJ SOLN
4.0000 mg | Freq: Once | INTRAMUSCULAR | Status: AC | PRN
  Administered 2020-10-05: 4 mg via INTRAVENOUS

## 2020-10-05 MED ORDER — ONDANSETRON HCL 4 MG/2ML IJ SOLN
INTRAMUSCULAR | Status: AC
  Filled 2020-10-05: qty 2

## 2020-10-05 MED ORDER — LOSARTAN POTASSIUM 50 MG PO TABS
50.0000 mg | ORAL_TABLET | Freq: Every day | ORAL | Status: DC
  Administered 2020-10-05: 50 mg via ORAL
  Filled 2020-10-05: qty 1

## 2020-10-05 MED ORDER — AMISULPRIDE (ANTIEMETIC) 5 MG/2ML IV SOLN: 10.0000 mg | Freq: Once | INTRAVENOUS | Status: DC | PRN

## 2020-10-05 MED ORDER — FENTANYL CITRATE (PF) 100 MCG/2ML IJ SOLN
INTRAMUSCULAR | Status: AC
  Filled 2020-10-05: qty 2

## 2020-10-05 MED ORDER — LIDOCAINE HCL (PF) 2 % IJ SOLN
INTRAMUSCULAR | Status: AC
  Filled 2020-10-05: qty 5

## 2020-10-05 MED ORDER — PANTOPRAZOLE SODIUM 40 MG PO TBEC
40.0000 mg | DELAYED_RELEASE_TABLET | Freq: Every day | ORAL | Status: DC
  Administered 2020-10-05: 40 mg via ORAL

## 2020-10-05 MED ORDER — INSULIN ASPART 100 UNIT/ML ~~LOC~~ SOLN
0.0000 [IU] | Freq: Three times a day (TID) | SUBCUTANEOUS | Status: DC
  Administered 2020-10-05 (×2): 3 [IU] via SUBCUTANEOUS

## 2020-10-05 MED ORDER — SENNA 8.6 MG PO TABS
ORAL_TABLET | ORAL | Status: AC
  Filled 2020-10-05: qty 1

## 2020-10-05 MED ORDER — MIDAZOLAM HCL 2 MG/2ML IJ SOLN
INTRAMUSCULAR | Status: AC
  Filled 2020-10-05: qty 2

## 2020-10-05 MED ORDER — INSULIN ASPART 100 UNIT/ML ~~LOC~~ SOLN
SUBCUTANEOUS | Status: AC
  Filled 2020-10-05: qty 1

## 2020-10-05 MED ORDER — OXYCODONE HCL 5 MG PO TABS
ORAL_TABLET | ORAL | Status: AC
  Filled 2020-10-05: qty 1

## 2020-10-05 MED ORDER — LACTATED RINGERS IV SOLN: INTRAVENOUS | Status: DC

## 2020-10-05 MED ORDER — WHITE PETROLATUM EX OINT
TOPICAL_OINTMENT | CUTANEOUS | Status: AC
  Filled 2020-10-05: qty 5

## 2020-10-05 MED ORDER — LIDOCAINE 2% (20 MG/ML) 5 ML SYRINGE
INTRAMUSCULAR | Status: DC | PRN
  Administered 2020-10-05: 100 mg via INTRAVENOUS

## 2020-10-05 MED ORDER — OXYCODONE HCL 5 MG PO TABS: 5.0000 mg | ORAL_TABLET | Freq: Once | ORAL | Status: DC | PRN

## 2020-10-05 MED ORDER — PROPOFOL 10 MG/ML IV BOLUS
INTRAVENOUS | Status: AC
  Filled 2020-10-05: qty 20

## 2020-10-05 MED ORDER — ACETAMINOPHEN 500 MG PO TABS
ORAL_TABLET | ORAL | Status: AC
  Filled 2020-10-05: qty 2

## 2020-10-05 MED ORDER — INSULIN ASPART 100 UNIT/ML ~~LOC~~ SOLN: 0.0000 [IU] | Freq: Every day | SUBCUTANEOUS | Status: DC

## 2020-10-05 MED ORDER — PROPOFOL 10 MG/ML IV BOLUS
INTRAVENOUS | Status: DC | PRN
  Administered 2020-10-05: 200 mg via INTRAVENOUS

## 2020-10-05 MED ORDER — BACITRACIN-NEOMYCIN-POLYMYXIN OINTMENT TUBE
TOPICAL_OINTMENT | CUTANEOUS | Status: AC
  Filled 2020-10-05: qty 14.17

## 2020-10-05 MED ORDER — ONDANSETRON HCL 4 MG/2ML IJ SOLN
INTRAMUSCULAR | Status: DC | PRN
  Administered 2020-10-05: 4 mg via INTRAVENOUS

## 2020-10-05 MED ORDER — SODIUM CHLORIDE 0.9 % IR SOLN: 3000.0000 mL | Status: DC

## 2020-10-05 MED ORDER — OXYCODONE HCL 5 MG PO TABS
5.0000 mg | ORAL_TABLET | ORAL | Status: DC | PRN
  Administered 2020-10-05 (×2): 5 mg via ORAL

## 2020-10-05 MED ORDER — MIDAZOLAM HCL 5 MG/5ML IJ SOLN
INTRAMUSCULAR | Status: DC | PRN
  Administered 2020-10-05: 2 mg via INTRAVENOUS

## 2020-10-05 MED ORDER — SODIUM CHLORIDE 0.9 % IV SOLN: INTRAVENOUS | Status: DC

## 2020-10-05 MED ORDER — PHENYLEPHRINE 40 MCG/ML (10ML) SYRINGE FOR IV PUSH (FOR BLOOD PRESSURE SUPPORT)
PREFILLED_SYRINGE | INTRAVENOUS | Status: DC | PRN
  Administered 2020-10-05: 80 ug via INTRAVENOUS

## 2020-10-05 MED ORDER — METOPROLOL TARTRATE 25 MG PO TABS
100.0000 mg | ORAL_TABLET | Freq: Two times a day (BID) | ORAL | Status: DC
  Administered 2020-10-05: 100 mg via ORAL

## 2020-10-05 MED ORDER — OXYCODONE HCL 5 MG/5ML PO SOLN: 5.0000 mg | Freq: Once | ORAL | Status: DC | PRN

## 2020-10-05 MED ORDER — GLIMEPIRIDE 1 MG PO TABS
1.0000 mg | ORAL_TABLET | Freq: Every day | ORAL | Status: DC
  Administered 2020-10-06: 1 mg via ORAL
  Filled 2020-10-05 (×3): qty 1

## 2020-10-05 MED ORDER — ATORVASTATIN CALCIUM 80 MG PO TABS
80.0000 mg | ORAL_TABLET | Freq: Every day | ORAL | Status: DC
  Filled 2020-10-05: qty 1

## 2020-10-05 MED ORDER — FENTANYL CITRATE (PF) 100 MCG/2ML IJ SOLN
25.0000 ug | INTRAMUSCULAR | Status: DC | PRN
Start: 2020-10-05 — End: 2020-10-06
  Administered 2020-10-05 (×3): 25 ug via INTRAVENOUS

## 2020-10-05 MED ORDER — TRAMADOL HCL 50 MG PO TABS: 50.0000 mg | ORAL_TABLET | Freq: Four times a day (QID) | ORAL | 0 refills | Status: DC | PRN

## 2020-10-05 MED ORDER — FENTANYL CITRATE (PF) 100 MCG/2ML IJ SOLN
INTRAMUSCULAR | Status: DC | PRN
  Administered 2020-10-05: 25 ug via INTRAVENOUS
  Administered 2020-10-05: 50 ug via INTRAVENOUS

## 2020-10-05 MED ORDER — GENTAMICIN SULFATE 40 MG/ML IJ SOLN
5.0000 mg/kg | INTRAVENOUS | Status: AC
  Administered 2020-10-05: 380 mg via INTRAVENOUS
  Filled 2020-10-05: qty 9.5

## 2020-10-05 MED ORDER — ACETAMINOPHEN 500 MG PO TABS
1000.0000 mg | ORAL_TABLET | Freq: Four times a day (QID) | ORAL | Status: DC
  Administered 2020-10-05 (×2): 1000 mg via ORAL

## 2020-10-05 MED ORDER — BACITRACIN-NEOMYCIN-POLYMYXIN 400-5-5000 EX OINT
1.0000 "application " | TOPICAL_OINTMENT | Freq: Three times a day (TID) | CUTANEOUS | Status: DC | PRN
  Administered 2020-10-05: 1 via TOPICAL

## 2020-10-05 MED ORDER — SULFAMETHOXAZOLE-TRIMETHOPRIM 800-160 MG PO TABS: 1.0000 | ORAL_TABLET | Freq: Two times a day (BID) | ORAL | 0 refills | Status: DC

## 2020-10-05 MED ORDER — DEXAMETHASONE SODIUM PHOSPHATE 10 MG/ML IJ SOLN
INTRAMUSCULAR | Status: DC | PRN
  Administered 2020-10-05: 5 mg via INTRAVENOUS

## 2020-10-05 MED ORDER — METOPROLOL TARTRATE 25 MG PO TABS
ORAL_TABLET | ORAL | Status: AC
  Filled 2020-10-05: qty 4

## 2020-10-05 MED ORDER — SODIUM CHLORIDE 0.9 % IR SOLN
Status: DC | PRN
  Administered 2020-10-05: 3000 mL

## 2020-10-05 MED ORDER — SENNOSIDES-DOCUSATE SODIUM 8.6-50 MG PO TABS
1.0000 | ORAL_TABLET | Freq: Two times a day (BID) | ORAL | Status: DC
  Administered 2020-10-05: 1 via ORAL
  Filled 2020-10-05: qty 1

## 2020-10-05 MED ORDER — PANTOPRAZOLE SODIUM 40 MG PO TBEC
DELAYED_RELEASE_TABLET | ORAL | Status: AC
  Filled 2020-10-05: qty 1

## 2020-10-05 MED ORDER — PHENYLEPHRINE 40 MCG/ML (10ML) SYRINGE FOR IV PUSH (FOR BLOOD PRESSURE SUPPORT)
PREFILLED_SYRINGE | INTRAVENOUS | Status: AC
  Filled 2020-10-05: qty 10

## 2020-10-05 MED ORDER — DEXAMETHASONE SODIUM PHOSPHATE 10 MG/ML IJ SOLN
INTRAMUSCULAR | Status: AC
  Filled 2020-10-05: qty 1

## 2020-10-05 SURGICAL SUPPLY — 31 items
BAG DRAIN URO-CYSTO SKYTR STRL (DRAIN) ×3 IMPLANT
BAG DRN RND TRDRP ANRFLXCHMBR (UROLOGICAL SUPPLIES) ×2
BAG DRN UROCATH (DRAIN) ×2
BAG URINE DRAIN 2000ML AR STRL (UROLOGICAL SUPPLIES) ×3 IMPLANT
BAG URINE LEG 500ML (DRAIN) IMPLANT
CATH FOLEY 2WAY SLVR 30CC 20FR (CATHETERS) IMPLANT
CATH FOLEY 3WAY 30CC 22FR (CATHETERS) ×1 IMPLANT
CLOTH BEACON ORANGE TIMEOUT ST (SAFETY) ×3 IMPLANT
ELECT BIVAP BIPO 22/24 DONUT (ELECTROSURGICAL)
ELECT REM PT RETURN 9FT ADLT (ELECTROSURGICAL)
ELECTRD BIVAP BIPO 22/24 DONUT (ELECTROSURGICAL) IMPLANT
ELECTRODE REM PT RTRN 9FT ADLT (ELECTROSURGICAL) ×2 IMPLANT
GLOVE BIO SURGEON STRL SZ7.5 (GLOVE) ×3 IMPLANT
GOWN STRL REUS W/TWL LRG LVL3 (GOWN DISPOSABLE) ×3 IMPLANT
GOWN STRL REUS W/TWL XL LVL3 (GOWN DISPOSABLE) ×7 IMPLANT
GUIDEWIRE STR DUAL SENSOR (WIRE) ×1 IMPLANT
HOLDER FOLEY CATH W/STRAP (MISCELLANEOUS) ×3 IMPLANT
IV CATH 14GX2 1/4 (CATHETERS) ×1 IMPLANT
IV NS IRRIG 3000ML ARTHROMATIC (IV SOLUTION) ×8 IMPLANT
KIT TURNOVER CYSTO (KITS) ×3 IMPLANT
LOOP CUT BIPOLAR 24F LRG (ELECTROSURGICAL) ×1 IMPLANT
MANIFOLD NEPTUNE II (INSTRUMENTS) ×3 IMPLANT
NDL ASPIRATION 22 (NEEDLE) IMPLANT
NEEDLE ASPIRATION 22 (NEEDLE) IMPLANT
PACK CYSTO (CUSTOM PROCEDURE TRAY) ×3 IMPLANT
SET IRRIG Y TYPE TUR BLADDER L (SET/KITS/TRAYS/PACK) ×1 IMPLANT
SYR TOOMEY IRRIG 70ML (MISCELLANEOUS) ×3
SYRINGE TOOMEY IRRIG 70ML (MISCELLANEOUS) ×2 IMPLANT
TUBE CONNECTING 12X1/4 (SUCTIONS) ×3 IMPLANT
TUBING UROLOGY SET (TUBING) ×2 IMPLANT
WATER STERILE IRR 3000ML UROMA (IV SOLUTION) ×2 IMPLANT

## 2020-10-05 NOTE — Brief Op Note (Signed)
10/05/2020  10:06 AM  PATIENT:  Tommy Yang  70 y.o. male  PRE-OPERATIVE DIAGNOSIS:  URINARY RETENTION  POST-OPERATIVE DIAGNOSIS:  * No post-op diagnosis entered *  PROCEDURE:  Procedure(s) with comments: TRANSURETHRAL RESECTION OF THE PROSTATE (TURP) (N/A) - 1 HR CYSTOSCOPY (N/A)  SURGEON:  Surgeon(s) and Role:    Alexis Frock, MD - Primary  PHYSICIAN ASSISTANT:   ASSISTANTS: none   ANESTHESIA:   general  EBL:  20mL   BLOOD ADMINISTERED:none  DRAINS: 3 way foley to NS irrigation   LOCAL MEDICATIONS USED:  NONE  SPECIMEN:  Source of Specimen:  Prostate chips  DISPOSITION OF SPECIMEN:  PATHOLOGY  COUNTS:  YES  TOURNIQUET:  * No tourniquets in log *  DICTATION: .Other Dictation: Dictation Number [id number]  PLAN OF CARE: Admit for overnight observation  PATIENT DISPOSITION:  PACU - hemodynamically stable.   Delay start of Pharmacological VTE agent (>24hrs) due to surgical blood loss or risk of bleeding: yes

## 2020-10-05 NOTE — Progress Notes (Signed)
RN spoke with Dr Tresa Moore about patient not tolerating traction on catheter.  He told RN that we can loosen the traction and saline lock.

## 2020-10-05 NOTE — Progress Notes (Signed)
Patient up in room he was told to use the nurse call bell so we can assist.  He ambulated himself to the bathroom and did not use nurse call bell.  He has been standing in the room off and on since his admission.  RN instructed him to call for assistance that his safety is our first goal and we do not want him to fall.

## 2020-10-05 NOTE — Progress Notes (Signed)
Patient feels like he is in Afib and when he dozzes off he becomes SOB.  RN placed 2 liters oxygen via nasal cannula.

## 2020-10-05 NOTE — Anesthesia Procedure Notes (Signed)
Procedure Name: LMA Insertion Date/Time: 10/05/2020 9:42 AM Performed by: Rogers Blocker, CRNA Pre-anesthesia Checklist: Patient identified, Emergency Drugs available, Suction available and Patient being monitored Patient Re-evaluated:Patient Re-evaluated prior to induction Oxygen Delivery Method: Circle System Utilized Preoxygenation: Pre-oxygenation with 100% oxygen Induction Type: IV induction Ventilation: Mask ventilation without difficulty LMA: LMA inserted LMA Size: 5.0 Number of attempts: 1 Placement Confirmation: positive ETCO2 Tube secured with: Tape Dental Injury: Teeth and Oropharynx as per pre-operative assessment

## 2020-10-05 NOTE — OR Nursing (Signed)
Foley catheter removed in [facility] OR #2 by RN.

## 2020-10-05 NOTE — Anesthesia Postprocedure Evaluation (Signed)
Anesthesia Post Note  Patient: Tommy Yang  Procedure(s) Performed: TRANSURETHRAL RESECTION OF THE PROSTATE (TURP) (N/A Prostate) CYSTOSCOPY (N/A Bladder)     Patient location during evaluation: PACU Anesthesia Type: General Level of consciousness: sedated Pain management: pain level controlled Vital Signs Assessment: post-procedure vital signs reviewed and stable Respiratory status: spontaneous breathing and respiratory function stable Cardiovascular status: stable Postop Assessment: no apparent nausea or vomiting Anesthetic complications: no   No complications documented.  Last Vitals:  Vitals:   10/05/20 1115 10/05/20 1130  BP: (!) 131/102 127/72  Pulse: 92 86  Resp: 15 18  Temp:  36.8 C  SpO2: 93% 93%    Last Pain:  Vitals:   10/05/20 1130  TempSrc:   PainSc: 7                  [name]

## 2020-10-05 NOTE — Transfer of Care (Signed)
Immediate Anesthesia Transfer of Care Note  Patient: Tommy Yang  Procedure(s) Performed: TRANSURETHRAL RESECTION OF THE PROSTATE (TURP) (N/A Prostate) CYSTOSCOPY (N/A Bladder)  Patient Location: PACU  Anesthesia Type:General  Level of Consciousness: awake, alert , oriented and patient cooperative  Airway & Oxygen Therapy: Patient Spontanous Breathing and Patient connected to face mask oxygen  Post-op Assessment: Report given to RN and Post -op Vital signs reviewed and stable  Post vital signs: Reviewed and stable  Last Vitals:  Vitals Value Taken Time  BP 105/57 10/05/20 1015  Temp    Pulse 109 10/05/20 1016  Resp 16 10/05/20 1016  SpO2 93 % 10/05/20 1016  Vitals shown include unvalidated device data.  Last Pain:  Vitals:   10/05/20 0739  TempSrc: Oral  PainSc: 0-No pain      Patients Stated Pain Goal: 3 (02/22/75 2831)  Complications: No complications documented.

## 2020-10-05 NOTE — H&P (Signed)
Tommy Yang is an 70 y.o. male.    Chief Complaint: Pre-OP Transurethral Resection of Prostate  HPI:   1 - Metastatic Recurrent Prostate Cancer - s/p XRT/Hormones 2015 for mod risk disese, then PSA recurrence with large volume Grade 5 cancer by BX 2018. PET 2019 with non-bulky Lt. ext iliac nodes.   Most Recent Restaging:  08/2019 - CT no gross mets, improved Lt ext. iliac nodes   Recent Course:  03/2018 Lupron 45  12/2018 PSA 0.09 / T <10 ==> Lupron 45; 06/2019 PSA 0.131 / T 22 ==> Eligard 45  12/2019 PSA 0.23 / T <10 ==> Eligard 45; 06/2020 PSA 1.04, T 11 ==>: Eligard 45   2 - Urolithiasis - [facility] 84m renal sotne incidental on PET-CT 2019. NO h/o colic. NO hydro.   3 - Recurrent Urinary Retention - s/p button channel TURP 09/2019 for refracotry post radiation retention. Remains on BID tamsulosin as well. Repeat retention 08/2020 and failed voiding trial x several with up to 12039mresiduals. Cysto 09/2020 with some partial regrowth with kissing lobes, barely.   PMH sig for morbid obesity, DM2 (A1c 8-10), DVT/Xarelto. 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 His PCP is AsCharolotte CapuchinP.   Today " [name] " is seen to proceed with re-TURP is setting of recurrent retention after prior prostate radiation performed at another facility. No interval fevers. Most recetn UCX enterobacter sens bactrim, gent. He has been on proph bactrim pre-op. C19 screen negative.     Past Medical History:  Diagnosis Date  . Arthritis   . Atrial fibrillation (H[facility]  . Complication of anesthesia    needs to sit up  at 45 degree angle or gets afib during surgery or in recovery  . Diabetes mellitus without complication (H[facility]   type 2  . D[place]) [place]   left leg.behind knee  . Foley catheter in place last changed 2eeks ago   last 7 months  . GERD (gastroesophageal reflux disease)   . Hx of radiation  therapy 40 tx 2015  . Hypercholesterolemia   . Hypertension   . [place]state cancer ([place]   gleason 4+3=7, 6/12 cores positive. seed implant and radiation  . Tourette's syndrome   . Urinary retention   . Wears glasses     Past Surgical History:  Procedure Laterality Date  . APPENDECTOMY  1968  . BALLOON DILATION N/A 08/02/2015   Procedure: BALLOON DILATION;  Surgeon: DaMilus BanisterMD;  [facility];  Service: Endoscopy;  Laterality: N/A;  . CARDIOVERSION N/A 05/13/2018   Procedure: CARDIOVERSION;  Surgeon: CrSanda KleinMD;  [facility];  Service: Cardiovascular;  Laterality: N/A;  . CARDIOVERSION N/A 05/18/2018   Procedure: CARDIOVERSION;  Surgeon: ChBuford DresserMD;  [facility] Service: Cardiovascular;  Laterality: N/A;  . COLONOSCOPY WITH PROPOFOL N/A 08/02/2015   Procedure: COLONOSCOPY WITH PROPOFOL w/ APC;  Surgeon: DaMilus BanisterMD;  [facility];  Service: Endoscopy;  Laterality: N/A;  . ESOPHAGOGASTRODUODENOSCOPY (EGD) WITH PROPOFOL N/A 08/02/2015   Procedure: ESOPHAGOGASTRODUODENOSCOPY (EGD) WITH PROPOFOL/ possible dilation.;  Surgeon: DaMilus BanisterMD;  [facility];  Service: Endoscopy;  Laterality: N/A;  . KNEE SURGERY  2001   right knee orthoscopic  . PROSTATE BIOPSY  12/06/13   Gleason 4+3=7, volume 35 gm  . [place]  [place] fluid [place]   . TONSILLECTOMY  as child  . TRANSURETHRAL RESECTION OF PROSTATE N/A 09/30/2019   Procedure: TRANSURETHRAL RESECTION OF THE PROSTATE (TURP);  Surgeon: Manny, Theodore, MD;  Location:  SURGERY CENTER;  Service: Urology;  Laterality: N/A;  1 HR    Family History  Problem Relation Age of Onset  . Heart disease Mother        before age 60  . Diabetes Mother   . Hyperlipidemia Mother   . Varicose Veins Mother   . Hypertension Mother   . Heart disease Father   . Diabetes Father   . Hyperlipidemia Father   .  Hypertension Father   . Cancer Brother   . COPD Brother   . Diabetes Brother   . Hyperlipidemia Brother   . Hypertension Brother    Social History:  reports that he quit smoking about 8 years ago. His smoking use included cigarettes. He has a 11.50 pack-year smoking history. He has never used smokeless tobacco. He reports current alcohol use. He reports that he does not use drugs.  Allergies:  Allergies  Allergen Reactions  . Other     shingls shot caused atrial fib did not take 2nd shot    Medications Prior to Admission  Medication Sig Dispense Refill  . Cyanocobalamin (VITAMIN B 12 PO) Take by mouth. 1000 mg daily    . atorvastatin (LIPITOR) 80 MG tablet Take 1 tablet (80 mg total) by mouth daily. 90 tablet 3  . blood glucose meter kit and supplies KIT Test blood sugar daily as directed. Dx code: E11.9 1 each 0  . CALCIUM PO Take 1,000 tablets by mouth daily.     . CINNAMON PO Take 500 mg by mouth daily.     . Empagliflozin-metFORMIN HCl ER (SYNJARDY XR) 12.12-998 MG TB24 Take 2 tablets daily 180 tablet 3  . glimepiride (AMARYL) 1 MG tablet TAKE 1 TABLET (1 MG TOTAL) BY MOUTH DAILY WITH BREAKFAST. 90 tablet 1  . glucose blood (ACCU-CHEK GUIDE) test strip Use as instructed to check blood sugar twice daily. 100 each 3  . leuprolide (LUPRON) 11.25 MG injection Inject 11.25 mg into the muscle every 6 (six) months.     . losartan (COZAAR) 50 MG tablet TAKE 1 TABLET BY MOUTH EVERY DAY 90 tablet 1  . metoprolol tartrate (LOPRESSOR) 100 MG tablet TAKE 1 TABLET BY MOUTH TWICE A DAY 180 tablet 2  . pantoprazole (PROTONIX) 40 MG tablet TAKE 1 TABLET BY MOUTH EVERY DAY 90 tablet 3  . Semaglutide, 1 MG/DOSE, (OZEMPIC, 1 MG/DOSE,) 2 MG/1.5ML SOPN Inject 1 mg into the skin once a week. fridays 4.5 mL 1  . sulfamethoxazole-trimethoprim (BACTRIM DS) 800-160 MG tablet Take 1 tablet by mouth 2 (two) times daily.    . tamsulosin (FLOMAX) 0.4 MG CAPS capsule Take 1 capsule (0.4 mg total) by mouth daily.  (Patient taking differently: Take 0.4 mg by mouth 2 (two) times daily.) 30 capsule 30  . Vitamin D, Cholecalciferol, 400 UNITS TABS Take 400 Units by mouth daily.    . XARELTO 20 MG TABS tablet TAKE 1 TABLET (20 MG TOTAL) BY MOUTH DAILY BEFORE BREAKFAST. 90 tablet 3  . zinc gluconate 50 MG tablet Take 50 mg by mouth daily.      Results for orders placed or performed during the hospital encounter of 10/03/20 (from the past 48 hour(s))  SARS CORONAVIRUS 2 (TAT 6-24 HRS) Nasopharyngeal Nasopharyngeal Swab     Status: None     Collection Time: 10/03/20  8:22 AM   Specimen: Nasopharyngeal Swab  Result Value Ref Range   SARS Coronavirus 2 NEGATIVE NEGATIVE    Comment: (NOTE) SARS-CoV-2 target nucleic acids are NOT DETECTED.  The SARS-CoV-2 RNA is generally detectable in upper and lower respiratory specimens during the acute phase of infection. Negative results do not preclude SARS-CoV-2 infection, do not rule out co-infections with other pathogens, and should not be used as the sole basis for treatment or other patient management decisions. Negative results must be combined with clinical observations, patient history, and epidemiological information. The expected result is Negative.  Fact Sheet for Patients: https://www.fda.gov/media/138098/download  Fact Sheet for Healthcare Providers: https://www.fda.gov/media/138095/download  This test is not yet approved or cleared by the [place] FDA and  has been authorized for detection and/or diagnosis of SARS-CoV-2 by FDA under an Emergency Use Authorization (EUA). This EUA will remain  in effect (meaning this test can be used) for the duration of the COVID-19 declaration under Se ction 564(b)(1) of the Act, 21 U.S.C. section 360bbb-3(b)(1), unless the authorization is terminated or revoked sooner.  Performed at [facility], [address] [place], [place] [address]    No results found.  Review of Systems  Constitutional:  Negative for chills and fever.  All other systems reviewed and are negative.   Height 5' 11" (1.803 m), weight (!) 137 kg. Physical Exam Vitals reviewed.  Constitutional:      Comments: Loud, pleasant, at baseline.   HENT:     Nose: Nose normal.     Mouth/Throat:     Mouth: Mucous membranes are moist.  Cardiovascular:     Rate and Rhythm: Normal rate.     Pulses: Normal pulses.  Abdominal:     Comments: Morbid truncal obesity limits sensitivity of exam.   Genitourinary:    Comments: Foley in place with NON-foul urine.  Musculoskeletal:        General: Normal range of motion.     Cervical back: Normal range of motion.  Skin:    General: Skin is warm.  Neurological:     General: No focal deficit present.     Mental Status: He is alert.      Assessment/Plan  Proceed as planned with re-Transurethral Resection of Prostate. Risks (including continued retention or incontinence as is post-XRT), benefits, alternatives, expected peri-op course discussed previously and reiterated today.   Theodore Manny, MD 10/05/2020, 7:07 AM   

## 2020-10-06 DIAGNOSIS — Z79899 Other long term (current) drug therapy: Secondary | ICD-10-CM | POA: Diagnosis not present

## 2020-10-06 DIAGNOSIS — Z87891 Personal history of nicotine dependence: Secondary | ICD-10-CM | POA: Diagnosis not present

## 2020-10-06 DIAGNOSIS — Z792 Long term (current) use of antibiotics: Secondary | ICD-10-CM | POA: Diagnosis not present

## 2020-10-06 DIAGNOSIS — C61 Malignant neoplasm of prostate: Secondary | ICD-10-CM | POA: Diagnosis not present

## 2020-10-06 DIAGNOSIS — Z8546 Personal history of malignant neoplasm of prostate: Secondary | ICD-10-CM | POA: Diagnosis not present

## 2020-10-06 DIAGNOSIS — C799 Secondary malignant neoplasm of unspecified site: Secondary | ICD-10-CM | POA: Diagnosis not present

## 2020-10-06 DIAGNOSIS — E119 Type 2 diabetes mellitus without complications: Secondary | ICD-10-CM | POA: Diagnosis not present

## 2020-10-06 DIAGNOSIS — R339 Retention of urine, unspecified: Secondary | ICD-10-CM | POA: Diagnosis not present

## 2020-10-06 DIAGNOSIS — Z7984 Long term (current) use of oral hypoglycemic drugs: Secondary | ICD-10-CM | POA: Diagnosis not present

## 2020-10-06 DIAGNOSIS — Z7901 Long term (current) use of anticoagulants: Secondary | ICD-10-CM | POA: Diagnosis not present

## 2020-10-06 DIAGNOSIS — Z923 Personal history of irradiation: Secondary | ICD-10-CM | POA: Diagnosis not present

## 2020-10-06 LAB — HEMOGLOBIN AND HEMATOCRIT, BLOOD
HCT: 42.9 % (ref 39.0–52.0)
Hemoglobin: 13.6 g/dL (ref 13.0–17.0)

## 2020-10-06 LAB — BASIC METABOLIC PANEL
Anion gap: 9 (ref 5–15)
BUN: 29 mg/dL — ABNORMAL HIGH (ref 8–23)
CO2: 26 mmol/L (ref 22–32)
Calcium: 9.5 mg/dL (ref 8.9–10.3)
Chloride: 105 mmol/L (ref 98–111)
Creatinine, Ser: 1 mg/dL (ref 0.61–1.24)
GFR, Estimated: >60 mL/min (ref >60)
Glucose, Bld: 166 mg/dL — ABNORMAL HIGH (ref 70–99)
Potassium: 5 mmol/L (ref 3.5–5.1)
Sodium: 140 mmol/L (ref 135–145)

## 2020-10-06 LAB — GLUCOSE, CAPILLARY: Glucose-Capillary: 118 mg/dL — ABNORMAL HIGH (ref 70–99)

## 2020-10-06 MED ORDER — DOCUSATE SODIUM 100 MG PO CAPS: 100.0000 mg | ORAL_CAPSULE | Freq: Two times a day (BID) | ORAL | 11 refills | Status: AC

## 2020-10-06 MED ORDER — ACETAMINOPHEN 500 MG PO TABS
ORAL_TABLET | ORAL | Status: AC
  Filled 2020-10-06: qty 2

## 2020-10-06 NOTE — Progress Notes (Addendum)
Progress note  POD1 s/p TURP  Subjective: The patient is doing well.  No complaint this morning. CBI off  Objective: Vital signs in last 24 hours: Temp:  [97.5 F (36.4 C)-98.2 F (36.8 C)] 97.8 F (36.6 C) (02/05 0649) Pulse Rate:  [77-109] 87 (02/05 0649) Resp:  [14-21] 16 (02/05 0649) BP: (91-131)/(51-102) 110/73 (02/05 0649) SpO2:  [93 %-98 %] 93 % (02/05 0649)  Intake/Output from previous day: 02/04 0701 - 02/05 0700 In: 3219.5 [P.O.:960; I.V.:800; IV Piggyback:109.5] Out: 5702 [Urine:5700; Blood:2] Intake/Output this shift: No intake/output data recorded.  Physical Exam:  General: Alert and oriented. Abdomen: Soft, Nondistended. GU: urine clear yellow, CBI clamped  Lab Results: Recent Labs    10/05/20 0805 10/06/20 0517  HGB 14.6 13.6  HCT 43.0 42.9    Assessment/Plan: POD#1 s/p TURP, urine clear   1) Clamp CBI 2) Discharge home with foley in place, scheduled for removal in 3 days. Restart xarelto in 2 days as long as urine clear. I performed a history and physical examination of the patient and discussed the patients management with the resident.  I reviewed the resident's note and agree with the documented findings and plan of care.     LOS: 0 days   Carmie Kanner 10/06/2020, 8:50 AM

## 2020-10-06 NOTE — Discharge Instructions (Signed)
Please restart your xarelto on Monday. You will return to clinic for foley catheter removal on Tuesday.   Indwelling Urinary Catheter Care, Adult An indwelling urinary catheter is a thin tube that is put into your bladder. The tube helps to drain pee (urine) out of your body. The tube goes in through your urethra. Your urethra is where pee comes out of your body. Your pee will come out through the catheter, then it will go into a bag (drainage bag). Take good care of your catheter so it will work well. How to wear your catheter and bag Supplies needed  Sticky tape (adhesive tape) or a leg strap.  Alcohol wipe or soap and water (if you use tape).  A clean towel (if you use tape).  Large overnight bag.  Smaller bag (leg bag). Wearing your catheter Attach your catheter to your leg with tape or a leg strap.  Make sure the catheter is not pulled tight.  If a leg strap gets wet, take it off and put on a dry strap.  If you use tape to hold the bag on your leg: 1. Use an alcohol wipe or soap and water to wash your skin where the tape made it sticky before. 2. Use a clean towel to pat-dry that skin. 3. Use new tape to make the bag stay on your leg. Wearing your bags You should have been given a large overnight bag.  You may wear the overnight bag in the day or night.  Always have the overnight bag lower than your bladder.  Do not let the bag touch the floor.  Before you go to sleep, put a clean plastic bag in a wastebasket. Then hang the overnight bag inside the wastebasket. You should also have a smaller leg bag that fits under your clothes.  Always wear the leg bag below your knee.  Do not wear your leg bag at night. How to care for your skin and catheter Supplies needed  A clean washcloth.  Water and mild soap.  A clean towel. Caring for your skin and catheter  Clean the skin around your catheter every day: 1. Wash your hands with soap and water. 2. Wet a clean  washcloth in warm water and mild soap. 3. Clean the skin around your urethra.  If you are male:  Gently spread the folds of skin around your vagina (labia).  With the washcloth in your other hand, wipe the inner side of your labia on each side. Wipe from front to back.  If you are male:  Pull back any skin that covers the end of your penis (foreskin).  With the washcloth in your other hand, wipe your penis in small circles. Start wiping at the tip of your penis, then move away from the catheter.  Move the foreskin back in place, if needed. 4. With your free hand, hold the catheter close to where it goes into your body.  Keep holding the catheter during cleaning so it does not get pulled out. 5. With the washcloth in your other hand, clean the catheter.  Only wipe downward on the catheter.  Do not wipe upward toward your body. Doing this may push germs into your urethra and cause infection. 6. Use a clean towel to pat-dry the catheter and the skin around it. Make sure to wipe off all soap. 7. Wash your hands with soap and water.  Shower every day. Do not take baths.  Do not use cream, ointment, or lotion  on the area where the catheter goes into your body, unless your doctor tells you to.  Do not use powders, sprays, or lotions on your genital area.  Check your skin around the catheter every day for signs of infection. Check for: ? Redness, swelling, or pain. ? Fluid or blood. ? Warmth. ? Pus or a bad smell.      How to empty the bag Supplies needed  Rubbing alcohol.  Gauze pad or cotton ball.  Tape or a leg strap. Emptying the bag Pour the pee out of your bag when it is ?- full, or at least 2-3 times a day. Do this for your overnight bag and your leg bag. 1. Wash your hands with soap and water. 2. Separate (detach) the bag from your leg. 3. Hold the bag over the toilet or a clean pail. Keep the bag lower than your hips and bladder. This is so the pee (urine) does  not go back into the tube. 4. Open the pour spout. It is at the bottom of the bag. 5. Empty the pee into the toilet or pail. Do not let the pour spout touch any surface. 6. Put rubbing alcohol on a gauze pad or cotton ball. 7. Use the gauze pad or cotton ball to clean the pour spout. 8. Close the pour spout. 9. Attach the bag to your leg with tape or a leg strap. 10. Wash your hands with soap and water. Follow instructions for cleaning the drainage bag:  From the product maker.  As told by your doctor. How to change the bag Supplies needed  Alcohol wipes.  A clean bag.  Tape or a leg strap. Changing the bag Replace your bag when it starts to leak, smell bad, or look dirty. 1. Wash your hands with soap and water. 2. Separate the dirty bag from your leg. 3. Pinch the catheter with your fingers so that pee does not spill out. 4. Separate the catheter tube from the bag tube where these tubes connect (at the connection valve). Do not let the tubes touch any surface. 5. Clean the end of the catheter tube with an alcohol wipe. Use a different alcohol wipe to clean the end of the bag tube. 6. Connect the catheter tube to the tube of the clean bag. 7. Attach the clean bag to your leg with tape or a leg strap. Do not make the bag tight on your leg. 8. Wash your hands with soap and water. General rules  Never pull on your catheter. Never try to take it out. Doing that can hurt you.  Always wash your hands before and after you touch your catheter or bag. Use a mild, fragrance-free soap. If you do not have soap and water, use hand sanitizer.  Always make sure there are no twists or bends (kinks) in the catheter tube.  Always make sure there are no leaks in the catheter or bag.  Drink enough fluid to keep your pee pale yellow.  Do not take baths, swim, or use a hot tub.  If you are male, wipe from front to back after you poop (have a bowel movement).   Contact a doctor if:  Your  pee is cloudy.  Your pee smells worse than usual.  Your catheter gets clogged.  Your catheter leaks.  Your bladder feels full. Get help right away if:  You have redness, swelling, or pain where the catheter goes into your body.  You have fluid, blood, pus,  or a bad smell coming from the area where the catheter goes into your body.  Your skin feels warm where the catheter goes into your body.  You have a fever.  You have pain in your: ? Belly (abdomen). ? Legs. ? Lower back. ? Bladder.  You see blood in the catheter.  Your pee is pink or red.  You feel sick to your stomach (nauseous).  You throw up (vomit).  You have chills.  Your pee is not draining into the bag.  Your catheter gets pulled out. Summary  An indwelling urinary catheter is a thin tube that is placed into the bladder to help drain pee (urine) out of the body.  The catheter is placed into the part of the body that drains pee from the bladder (urethra).  Taking good care of your catheter will keep it working properly and help prevent problems.  Always wash your hands before and after touching your catheter or bag.  Never pull on your catheter or try to take it out. This information is not intended to replace advice given to you by your health care provider. Make sure you discuss any questions you have with your health care provider. Document Revised: 12/10/2018 Document Reviewed: 04/03/2017 Elsevier Patient Education  2021 [place]. Transurethral Resection of the Prostate, Care After This sheet gives you information about how to care for yourself after your procedure. Your health care provider may also give you more specific instructions. If you have problems or questions, contact your health care provider. What can I expect after the procedure? After the procedure, it is common to have:  Mild pain in your lower abdomen.  Soreness or mild discomfort in your penis from having the catheter  inserted during the procedure.  A feeling of urgency when you need to urinate.  A small amount of blood in your urine. You may notice some small blood clots in your urine. These are normal. Follow these instructions at home: Medicines  Take over-the-counter and prescription medicines only as told by your health care provider.  If you were prescribed an antibiotic medicine, take it as told by your health care provider. Do not stop taking the antibiotic even if you start to feel better.  Ask your health care provider if the medicine prescribed to you: ? Requires you to avoid driving or using heavy machinery. ? Can cause constipation. You may need to take actions to prevent or treat constipation, such as:  Take over-the-counter or prescription medicines.  Eat foods that are high in fiber, such as fresh fruits and vegetables, whole grains, and beans.  Limit foods that are high in fat and processed sugars, such as fried or sweet foods.  Do not drive for 24 hours if you were given a sedative during your procedure. Activity  Return to your normal activities as told by your health care provider. Ask your health care provider what activities are safe for you.  Do not lift anything that is heavier than 10 lb (4.5 kg), or the limit that you are told, for 3 weeks after the procedure or until your health care provider says that it is safe.  Avoid intense physical activity for as long as told by your health care provider.  Avoid sitting for a long time without moving. Get up and move around one or more times every few hours. This helps to prevent blood clots. You may increase your physical activity gradually as you start to feel better.   Lifestyle  Do not drink alcohol for as long as told by your health care provider. This is especially important if you are taking prescription pain medicines.  Do not engage in sexual activity until your health care provider says that you can do this. General  instructions  Do not take baths, swim, or use a hot tub until your health care provider approves.  Drink enough fluid to keep your urine pale yellow.  Urinate as soon as you feel the need to. Do not try to hold your urine for long periods of time.  If your health care provider approves, you may take a stool softener for 2-3 weeks to prevent you from straining to have a bowel movement.  Wear compression stockings as told by your health care provider. These stockings help to prevent blood clots and reduce swelling in your legs.  Keep all follow-up visits as told by your health care provider. This is important.   Contact a health care provider if you have:  Difficulty urinating.  A fever.  Pain that gets worse or does not improve with medicine.  Blood in your urine that does not go away after 1 week of resting and drinking more fluids.  Swelling in your penis or testicles. Get help right away if:  You are unable to urinate.  You are having more blood clots in your urine instead of fewer.  You have: ? Large blood clots. ? A lot of blood in your urine. ? Pain in your back or lower abdomen. ? Pain or swelling in your legs. ? Chills and you are shaking. ? Difficulty breathing or shortness of breath. Summary  After the procedure, it is common to have a small amount of blood in your urine.  Avoid heavy lifting and intense physical activity for as long as told by your health care provider.  Urinate as soon as you feel the need to. Do not try to hold your urine for long periods of time.  Keep all follow-up visits as told by your health care provider. This is important. This information is not intended to replace advice given to you by your health care provider. Make sure you discuss any questions you have with your health care provider. Document Revised: 12/08/2018 Document Reviewed: 05/19/2018 Elsevier Patient Education  2021 [employer] 1 - You may have urinary urgency  (bladder spasms) and bloody urine on / off for up to 3 weeks. This is normal.  2 - Call MD or go to ER for fever >102, severe pain / nausea / vomiting not relieved by medications, or acute change in medical status

## 2020-10-06 NOTE — Discharge Summary (Signed)
Date of admission: 10/05/2020  Date of discharge: 10/06/2020  Admission diagnosis: Urinary retention  Discharge diagnosis: Urinary retention  History and Physical: For full details, please see admission history and physical. Briefly, Tommy Yang is a 70 y.o. year old patient with urinary retention who presents for transurethral resection of the prostate.   Hospital Course: Patient underwent cystoscopy with transurethral resection of the prostate on 10/05/20. He tolerated the procedure well. He remained on CBI post op, this was weaned by POD1 and urine was clear yellow. CBI was clamped and patient was discharged on POD1 with foley in place. He was instructed to resume his xarelto in 2 days. He will return to clinic for foley removal in 3 days.   Laboratory values: Recent Labs    10/05/20 0805 10/06/20 0517  HGB 14.6 13.6  HCT 43.0 42.9   Recent Labs    10/05/20 0805 10/06/20 0517  CREATININE 1.20 1.00    Disposition: Home  Discharge instruction: The patient was instructed to be ambulatory but told to refrain from heavy lifting, strenuous activity, or driving.   Discharge medications:  Allergies as of 10/06/2020      Reactions   Other    shingls shot caused atrial fib did not take 2nd shot      Medication List    TAKE these medications   atorvastatin 80 MG tablet Commonly known as: LIPITOR Take 1 tablet (80 mg total) by mouth daily.   blood glucose meter kit and supplies Kit Test blood sugar daily as directed. Dx code: E11.9   CALCIUM PO Take 1,000 tablets by mouth daily.   CINNAMON PO Take 500 mg by mouth daily.   docusate sodium 100 MG capsule Commonly known as: Colace Take 1 capsule (100 mg total) by mouth 2 (two) times daily.   glimepiride 1 MG tablet Commonly known as: AMARYL TAKE 1 TABLET (1 MG TOTAL) BY MOUTH DAILY WITH BREAKFAST.   glucose blood test strip Commonly known as: Accu-Chek Guide Use as instructed to check blood sugar twice daily.    leuprolide 11.25 MG injection Commonly known as: LUPRON Inject 11.25 mg into the muscle every 6 (six) months.   losartan 50 MG tablet Commonly known as: COZAAR TAKE 1 TABLET BY MOUTH EVERY DAY   metoprolol tartrate 100 MG tablet Commonly known as: LOPRESSOR TAKE 1 TABLET BY MOUTH TWICE A DAY   Ozempic (1 MG/DOSE) 2 MG/1.5ML Sopn Generic drug: Semaglutide (1 MG/DOSE) Inject 1 mg into the skin once a week. fridays   pantoprazole 40 MG tablet Commonly known as: PROTONIX TAKE 1 TABLET BY MOUTH EVERY DAY   sulfamethoxazole-trimethoprim 800-160 MG tablet Commonly known as: BACTRIM DS Take 1 tablet by mouth 2 (two) times daily. X 2 days. Begin day before next Urology appontment What changed: additional instructions Notes to patient: Start taking medicine on Monday October 08, 2020   Synjardy XR 12.12-998 MG Tb24 Generic drug: Empagliflozin-metFORMIN HCl ER Take 2 tablets daily   tamsulosin 0.4 MG Caps capsule Commonly known as: FLOMAX Take 1 capsule (0.4 mg total) by mouth daily. What changed: when to take this   traMADol 50 MG tablet Commonly known as: Ultram Take 1 tablet (50 mg total) by mouth every 6 (six) hours as needed for moderate pain or severe pain. Post-operatively   VITAMIN B 12 PO Take by mouth. 1000 mg daily   Vitamin D (Cholecalciferol) 10 MCG (400 UNIT) Tabs Take 400 Units by mouth daily.   Xarelto 20 MG Tabs tablet  Generic drug: rivaroxaban TAKE 1 TABLET (20 MG TOTAL) BY MOUTH DAILY BEFORE BREAKFAST.   zinc gluconate 50 MG tablet Take 50 mg by mouth daily.       Followup:   Follow-up Information    Tommy Frock, MD On 10/09/2020.   Specialty: Urology Why: at 8:45 for MD visit and catheter removal.  Contact information: [place] [id number] [phone]

## 2020-10-08 ENCOUNTER — Other Ambulatory Visit (HOSPITAL_COMMUNITY): Payer: Self-pay | Admitting: Nurse Practitioner

## 2020-10-08 ENCOUNTER — Encounter (HOSPITAL_BASED_OUTPATIENT_CLINIC_OR_DEPARTMENT_OTHER): Payer: Self-pay | Admitting: Urology

## 2020-10-08 LAB — SURGICAL PATHOLOGY

## 2020-10-08 NOTE — Op Note (Signed)
NAME: Tommy Yang, WHOLEY MEDICAL RECORD [id number] ACCOUNT [id number] DATE OF BIRTH:10-08-1950 FACILITY: [facility] LOCATION: [facility] PHYSICIAN:Cereniti Curb, MD  OPERATIVE REPORT  DATE OF PROCEDURE:  10/05/2020  PREOPERATIVE DIAGNOSIS:  Recurrent urinary retention,  history of metastatic prostate cancer.  PROCEDURE:  Transection of the prostate for regrowth.  ESTIMATED BLOOD LOSS:  50 mL.  MEDICATIONS:  None.  SPECIMENS:  Prostate chips for pathology.  FINDINGS: 1.  Regrowth of bilobar prostatic hypertrophy with kissing lobes pre-resection. 2.  Wide open urinary channel from the bladder neck to the verumontanum post-resection. 3.  Purposeful non-wide resection given his history of prostate radiation.  INDICATIONS:  The patient is an unfortunate 70 year old man with history of prostate radiation years ago who has now developed metastatic disease.  He is on androgen deprivation with good biochemical response.  He unfortunately has had recurrent urinary  retention due to outlet obstruction.  This has been proven on prior residual urine studies.  He underwent an ablation type button TURP in 09/2019 for urinary retention, which he tolerated well and this resulted in resolution of his retention for over a  year.  He then unfortunately developed recurrent retention and failed trial of void times several.  Cystoscopy corroborated regrowth with modest bilobar hypertrophy.  No evidence of any stricture disease or contracture.  Options were discussed including  chronic catheter versus reattempt at repeat transurethral resection with goal of catheter free and he adamantly wished to proceed.  We discussed beforehand again that there is a very fine line between retention and total incontinence with history of  prostate radiation.  He understands this risk and wishes to proceed.  Informed consent was obtained and placed in medical record.  DESCRIPTION OF PROCEDURE:  The patient being himself,  verified procedure transection to the prostate was confirmed.  Procedure timeout was performed.  Intravenous antibiotics administered, general LMA anesthesia induced.  The patient was placed into a  low lithotomy position.  Sterile field was created, prepped and draped base of the penis, perineum and proximal thighs using iodine.  Cystourethroscopy was performed using 26-French resectoscope sheath with visual obturator.  Inspection of the anterior  and posterior urethra was unremarkable.  Inspection of the prostatic fossa revealed moderate regrowth of bilobar hypertrophy with kissing lobes.  Bladder was mildly trabeculated.  Next, using resectoscope loop, very careful resection was performed in a  top-down orientation first at 12 o'clock position, then of the right lobe, then of the left lobe from the bladder neck to the area of verumontanum performing an open urinary channel.  However, this was not carried wide to the prostate capsule on purpose  again to help avoid total incontinence, which is a very high risk.  Following resection of the obstructing tissue, prostate chips were irrigated and set aside for permanent pathology.  Entire base of the resection area was fulgurated using coagulation  current and a new 22-French 3-way Foley catheter was placed over a sensor working wire, 20 mL around the balloon, this connected to normal saline irrigation, efflux very light pink on gentle catheter strap traction.  Procedure was terminated.  The  patient tolerated the procedure well.  No immediate  complications.  The patient was taken to [facility] in stable condition.  Plan for observation admission, likely discharge home tomorrow with his catheter.  IN/NUANCE  D:10/05/2020 T:10/06/2020 JOB:[id number]

## 2020-10-11 ENCOUNTER — Telehealth (HOSPITAL_COMMUNITY): Payer: Self-pay | Admitting: *Deleted

## 2020-10-11 ENCOUNTER — Other Ambulatory Visit (HOSPITAL_COMMUNITY): Payer: Self-pay | Admitting: Nurse Practitioner

## 2020-10-11 MED ORDER — FUROSEMIDE 20 MG PO TABS: ORAL_TABLET | ORAL | 0 refills | Status: DC

## 2020-10-11 NOTE — Telephone Encounter (Signed)
Pt called in stating he is having shortness of breath post urinary procedure. He denies elevated HRs or lower extremity edema but pt had this same scenario after last TURP. Discussed with Roderic Palau NP will send in lasix 20mg  daily for the next 3 days then call with update. Pt also complaining of back pain - instructed to call his PCP regarding this if does not improve.

## 2020-10-15 ENCOUNTER — Other Ambulatory Visit: Payer: Self-pay | Admitting: Internal Medicine

## 2020-10-15 DIAGNOSIS — I10 Essential (primary) hypertension: Secondary | ICD-10-CM

## 2020-10-15 MED ORDER — FUROSEMIDE 20 MG PO TABS: ORAL_TABLET | ORAL | 1 refills | Status: DC

## 2020-10-15 NOTE — Telephone Encounter (Signed)
Patient called in stating his shortness of breath is 85% better after 3 days of lasix but not back to baseline. Discussed with Roderic Palau NP will continue lasix 20mg  through end of week then call with update on Monday. Pt in agreement.

## 2020-10-15 NOTE — Addendum Note (Signed)
Addended by: Juluis Mire on: 10/15/2020 11:34 AM   Modules accepted: Orders

## 2020-10-15 NOTE — Telephone Encounter (Signed)
Please refill as per office routine med refill policy (all routine meds refilled for 3 mo or monthly per pt preference up to one year from last visit, then month to month grace period for 3 mo, then further med refills will have to be denied)  

## 2020-10-29 ENCOUNTER — Other Ambulatory Visit: Payer: Self-pay | Admitting: Internal Medicine

## 2020-10-29 ENCOUNTER — Other Ambulatory Visit: Payer: Self-pay | Admitting: Endocrinology

## 2020-10-29 ENCOUNTER — Other Ambulatory Visit (HOSPITAL_COMMUNITY): Payer: Self-pay | Admitting: Nurse Practitioner

## 2020-11-22 ENCOUNTER — Other Ambulatory Visit (HOSPITAL_COMMUNITY): Payer: Self-pay | Admitting: *Deleted

## 2020-11-29 ENCOUNTER — Encounter: Payer: Self-pay | Admitting: Gastroenterology

## 2020-12-04 DIAGNOSIS — C61 Malignant neoplasm of prostate: Secondary | ICD-10-CM | POA: Diagnosis not present

## 2020-12-11 DIAGNOSIS — C61 Malignant neoplasm of prostate: Secondary | ICD-10-CM | POA: Diagnosis not present

## 2020-12-11 DIAGNOSIS — Z5111 Encounter for antineoplastic chemotherapy: Secondary | ICD-10-CM | POA: Diagnosis not present

## 2020-12-24 DIAGNOSIS — R350 Frequency of micturition: Secondary | ICD-10-CM | POA: Diagnosis not present

## 2020-12-24 DIAGNOSIS — R8271 Bacteriuria: Secondary | ICD-10-CM | POA: Diagnosis not present

## 2020-12-31 ENCOUNTER — Other Ambulatory Visit: Payer: Self-pay

## 2020-12-31 ENCOUNTER — Other Ambulatory Visit (INDEPENDENT_AMBULATORY_CARE_PROVIDER_SITE_OTHER): Payer: BC Managed Care – PPO

## 2020-12-31 DIAGNOSIS — E1129 Type 2 diabetes mellitus with other diabetic kidney complication: Secondary | ICD-10-CM | POA: Diagnosis not present

## 2020-12-31 DIAGNOSIS — R809 Proteinuria, unspecified: Secondary | ICD-10-CM

## 2020-12-31 DIAGNOSIS — E1165 Type 2 diabetes mellitus with hyperglycemia: Secondary | ICD-10-CM | POA: Diagnosis not present

## 2020-12-31 LAB — BASIC METABOLIC PANEL
BUN: 19 mg/dL (ref 6–23)
CO2: 27 mEq/L (ref 19–32)
Calcium: 9.6 mg/dL (ref 8.4–10.5)
Chloride: 105 mEq/L (ref 96–112)
Creatinine, Ser: 1.07 mg/dL (ref 0.40–1.50)
GFR: 71.42 mL/min (ref >60.00)
Glucose, Bld: 99 mg/dL (ref 70–99)
Potassium: 4.4 mEq/L (ref 3.5–5.1)
Sodium: 141 mEq/L (ref 135–145)

## 2020-12-31 LAB — HEMOGLOBIN A1C: Hgb A1c MFr Bld: 7.1 % — ABNORMAL HIGH (ref 4.6–6.5)

## 2020-12-31 LAB — MICROALBUMIN / CREATININE URINE RATIO
Creatinine,U: 83.5 mg/dL
Microalb Creat Ratio: 47.5 mg/g — ABNORMAL HIGH (ref 0.0–30.0)
Microalb, Ur: 39.7 mg/dL — ABNORMAL HIGH (ref 0.0–1.9)

## 2021-01-01 ENCOUNTER — Other Ambulatory Visit: Payer: Self-pay | Admitting: Internal Medicine

## 2021-01-01 DIAGNOSIS — E785 Hyperlipidemia, unspecified: Secondary | ICD-10-CM

## 2021-01-02 NOTE — Progress Notes (Signed)
Patient ID: Tommy Yang, male   DOB: 03-Apr-1951, 70 y.o.   MRN: [id number]           Reason for Appointment: Follow-up     History of Present Illness:          Diagnosis: Type 2 diabetes mellitus, date of diagnosis: 05/2013       Past history:   He apparently had significant hyperglycemia in 2014 when seen in the urgent care center but did not establish with a PCP for control. He was started on treatment for his diabetes in 10/2013 and he was initially treated with metformin and Januvia A few months later he was also given glipizide ER to help his control when his A1c had gone up to 10.9 He says he was also seen by dietitian but no record available of this. Subsequently his blood sugar control had improved with A1c down to 6.8 in 9/15 On his consultation in 3/16 he had high blood sugars and weight gain; A1c was 9.1% with taking glipizide ER, Januvia and metformin.  Recent history:    Non-insulin hypoglycemic drugs the patient is taking are: Amaryl 1 mg at dinner, , Ozempic 1 mg weekly, Synjardy XR 12.12/998 2 tablets daily  His A1c is staying the same at 7.1, previously 6.9 and 7.2   Current blood sugar patterns and problems identified:  He is primarily checking his blood sugars after meals and is usually doing different times  He says he does not check his sugar in the morning before breakfast because if he has coffee during the night his blood sugar may be 120  Recently blood sugars have not been more than 160 from the data available for April  Because of being on Lupron he has fatigue and weakness and is not able to exercise much but he tries to do a little walking or lawnmowing  He thinks that he is eating small portions especially at suppertime   If he has any desserts he will try to have some sugar-free cake  Weight is about the same   Side effects from medications have been: None  Compliance with the medical regimen: Fair  Hypoglycemia: None    Glucose  monitoring:  done 1 times a day         Glucometer:  [place]  Blood Glucose readings by meter report, analysis done as before  AVERAGE for 30 days 122   AVERAGE for 30 days 127, previously 141  PRE-meal average 130  After meal average 141 previously 137  OVERALL range 93-199      Self-care: The diet that the patient has been following is: tries to limit portions and fats .   Dietician visit, most recent:?  2015 .               Weight history:    Wt Readings from Last 3 Encounters:  01/03/21 (!) 304 lb (137.9 kg)  10/05/20 (!) 305 lb 3.2 oz (138.4 kg)  09/14/20 (!) 304 lb 6.4 oz (138.1 kg)    Glycemic control:   Lab Results  Component Value Date   HGBA1C 7.1 (H) 12/31/2020   HGBA1C 7.1 (H) 08/03/2020   HGBA1C 6.9 (H) 05/18/2020   Lab Results  Component Value Date   MICROALBUR 39.7 (H) 12/31/2020   LDLCALC 91 08/03/2020   CREATININE 1.07 12/31/2020   Lab Results  Component Value Date   HGB 13.6 10/06/2020     Lab on 12/31/2020  Component Date Value Ref Range Status  .  Microalb, Ur 12/31/2020 39.7* 0.0 - 1.9 mg/dL Final  . Creatinine,U 12/31/2020 83.5  mg/dL Final  . Microalb Creat Ratio 12/31/2020 47.5* 0.0 - 30.0 mg/g Final  . Sodium 12/31/2020 141  135 - 145 mEq/L Final  . Potassium 12/31/2020 4.4  3.5 - 5.1 mEq/L Final  . Chloride 12/31/2020 105  96 - 112 mEq/L Final  . CO2 12/31/2020 27  19 - 32 mEq/L Final  . Glucose, Bld 12/31/2020 99  70 - 99 mg/dL Final  . BUN 12/31/2020 19  6 - 23 mg/dL Final  . Creatinine, Ser 12/31/2020 1.07  0.40 - 1.50 mg/dL Final  . GFR 12/31/2020 71.42  >60.00 mL/min Final   Calculated using the CKD-EPI Creatinine Equation (2021)  . Calcium 12/31/2020 9.6  8.4 - 10.5 mg/dL Final  . Hgb A1c MFr Bld 12/31/2020 7.1* 4.6 - 6.5 % Final   Glycemic Control Guidelines for People with Diabetes:Non Diabetic:  <6%Goal of Therapy: <7%Additional Action Suggested:  >8%       Allergies as of 01/03/2021      Reactions   Other     shingls shot caused atrial fib did not take 2nd shot      Medication List       Accurate as of Jan 03, 2021  8:20 AM. If you have any questions, ask your nurse or doctor.        atorvastatin 80 MG tablet Commonly known as: LIPITOR Take 1 tablet (80 mg total) by mouth daily.   blood glucose meter kit and supplies Kit Test blood sugar daily as directed. Dx code: E11.9   CALCIUM PO Take 1,000 tablets by mouth daily.   CINNAMON PO Take 500 mg by mouth daily.   docusate sodium 100 MG capsule Commonly known as: Colace Take 1 capsule (100 mg total) by mouth 2 (two) times daily.   doxycycline 100 MG tablet Commonly known as: VIBRA-TABS Take 100 mg by mouth 2 (two) times daily.   furosemide 20 MG tablet Commonly known as: LASIX Take 1 tablet by mouth daily for the next 5 days then only as needed for weight gain/swelling   glimepiride 1 MG tablet Commonly known as: AMARYL TAKE 1 TABLET (1 MG TOTAL) BY MOUTH DAILY WITH BREAKFAST.   glucose blood test strip Commonly known as: Accu-Chek Guide Use as instructed to check blood sugar twice daily.   leuprolide 11.25 MG injection Commonly known as: LUPRON Inject 11.25 mg into the muscle every 6 (six) months.   losartan 50 MG tablet Commonly known as: COZAAR TAKE 1 TABLET BY MOUTH EVERY DAY   metoprolol tartrate 100 MG tablet Commonly known as: LOPRESSOR TAKE 1 TABLET BY MOUTH TWICE A DAY   Ozempic (1 MG/DOSE) 2 MG/1.5ML Sopn Generic drug: Semaglutide (1 MG/DOSE) Inject 1 mg into the skin once a week. fridays   pantoprazole 40 MG tablet Commonly known as: PROTONIX TAKE 1 TABLET BY MOUTH EVERY DAY   sulfamethoxazole-trimethoprim 800-160 MG tablet Commonly known as: BACTRIM DS Take 1 tablet by mouth 2 (two) times daily. X 2 days. Begin day before next Urology appontment   Synjardy XR 12.12-998 MG Tb24 Generic drug: Empagliflozin-metFORMIN HCl ER Take 2 tablets daily   tamsulosin 0.4 MG Caps capsule Commonly known as:  FLOMAX Take 1 capsule (0.4 mg total) by mouth daily. What changed: when to take this   traMADol 50 MG tablet Commonly known as: Ultram Take 1 tablet (50 mg total) by mouth every 6 (six) hours as needed for  moderate pain or severe pain. Post-operatively   VITAMIN B 12 PO Take by mouth. 1000 mg daily   Vitamin D (Cholecalciferol) 10 MCG (400 UNIT) Tabs Take 400 Units by mouth daily.   Xarelto 20 MG Tabs tablet Generic drug: rivaroxaban TAKE 1 TABLET (20 MG TOTAL) BY MOUTH DAILY BEFORE BREAKFAST.   zinc gluconate 50 MG tablet Take 50 mg by mouth daily.       Allergies:  Allergies  Allergen Reactions  . Other     shingls shot caused atrial fib did not take 2nd shot    Past Medical History:  Diagnosis Date  . Arthritis   . Atrial fibrillation ([place])   . Complication of anesthesia    needs to sit up  at 45 degree angle or gets afib during surgery or in recovery  . Diabetes mellitus without complication ([place])    type 2  . DVT (deep venous thrombosis) ([place]) 2015   left leg.behind knee  . Foley catheter in place last changed 2eeks ago   last 7 months  . GERD (gastroesophageal reflux disease)   . Hx of radiation therapy 40 tx 2015  . Hypercholesterolemia   . Hypertension   . Prostate cancer ([place]) 12/06/13   gleason 4+3=7, 6/12 cores positive. seed implant and radiation  . Tourette's syndrome   . Urinary retention   . Wears glasses     Past Surgical History:  Procedure Laterality Date  . APPENDECTOMY  1968  . BALLOON DILATION N/A 08/02/2015   Procedure: BALLOON DILATION;  Surgeon: [name], MD;  Location: [facility];  Service: Endoscopy;  Laterality: N/A;  . CARDIOVERSION N/A 05/13/2018   Procedure: CARDIOVERSION;  Surgeon: Sanda Klein, MD;  Location: [facility];  Service: Cardiovascular;  Laterality: N/A;  . CARDIOVERSION N/A 05/18/2018   Procedure: CARDIOVERSION;  Surgeon: Buford Dresser, MD;  Location: [facility];  Service: Cardiovascular;   Laterality: N/A;  . COLONOSCOPY WITH PROPOFOL N/A 08/02/2015   Procedure: COLONOSCOPY WITH PROPOFOL w/ APC;  Surgeon: [name], MD;  Location: [facility];  Service: Endoscopy;  Laterality: N/A;  . CYSTOSCOPY N/A 10/05/2020   Procedure: CYSTOSCOPY;  Surgeon: Alexis Frock, MD;  Location: [facility];  Service: Urology;  Laterality: N/A;  . ESOPHAGOGASTRODUODENOSCOPY (EGD) WITH PROPOFOL N/A 08/02/2015   Procedure: ESOPHAGOGASTRODUODENOSCOPY (EGD) WITH PROPOFOL/ possible dilation.;  Surgeon: [name], MD;  Location: [facility];  Service: Endoscopy;  Laterality: N/A;  . KNEE SURGERY  2001   right knee orthoscopic  . PROSTATE BIOPSY  12/06/13   Gleason 4+3=7, volume 35 gm  . [place]   Tourette'ssyndrome spinal fluid removed   . TONSILLECTOMY  as child  . TRANSURETHRAL RESECTION OF PROSTATE N/A 09/30/2019   Procedure: TRANSURETHRAL RESECTION OF THE PROSTATE (TURP);  Surgeon: Alexis Frock, MD;  Location[facility];  Service: Urology;  Laterality: N/A;  1 HR  . TRANSURETHRAL RESECTION OF PROSTATE N/A 10/05/2020   Procedure: TRANSURETHRAL RESECTION OF THE PROSTATE (TURP);  Surgeon: Alexis Frock, MD;  Location[facility];  Service: Urology;  Laterality: N/A;  1 HR    Family History  Problem Relation Age of Onset  . Heart disease Mother        before age 26  . Diabetes Mother   . Hyperlipidemia Mother   . Varicose Veins Mother   . Hypertension Mother   . Heart disease Father   . Diabetes Father   . Hyperlipidemia Father   .  Hypertension Father   . Cancer Brother   . COPD Brother   . Diabetes Brother   . Hyperlipidemia Brother   . Hypertension Brother     Social History:  reports that he quit smoking about 8 years ago. His smoking use included cigarettes. He has a 11.50 pack-year smoking history. He has never used smokeless tobacco. He reports current alcohol use. He reports that he does not use  drugs.    Review of Systems        HYPERLIPIDEMIA: he has had significant hyperlipidemia and has been treated with Lipitor 80 mg daily Previously on Vascepa for high triglycerides but he felt that it would cause atrial fibrillation an hour after taking the medicine in the evening and he stopped this Lipitor increased on the last visit with improvement in LDL  His diet is usually low in fat  Triglycerides are still normal despite stopping Vascepa HDL usually low Has no history of CAD       Lab Results  Component Value Date   CHOL 155 08/03/2020   CHOL 170 05/18/2020   CHOL 144 11/18/2019   Lab Results  Component Value Date   HDL 36.60 (L) 08/03/2020   HDL 37 (L) 05/18/2020   HDL 37.30 (L) 11/18/2019   Lab Results  Component Value Date   LDLCALC 91 08/03/2020   LDLCALC 107 (H) 05/18/2020   LDLCALC 83 11/18/2019   Lab Results  Component Value Date   TRIG 137.0 08/03/2020   TRIG 144 05/18/2020   TRIG 117.0 11/18/2019   Lab Results  Component Value Date   CHOLHDL 4 08/03/2020   CHOLHDL 4.6 05/18/2020   CHOLHDL 4 11/18/2019   Lab Results  Component Value Date   LDLDIRECT 93.0 12/30/2016   LDLDIRECT 103.0 02/08/2015                HYPERTENSION: Mild and has been treated with losartan 50 mg and low-dose metoprolol, followed by PCP and cardiologist   BP Readings from Last 3 Encounters:  01/03/21 128/82  10/06/20 132/70  09/14/20 106/64    RENAL function   Lab Results  Component Value Date   CREATININE 1.07 12/31/2020   CREATININE 1.00 10/06/2020   CREATININE 1.20 10/05/2020    Followed by cardiologist for atrial fibrillation  Taking B12 for low normal vitamin B12 level  LABS:  Lab on 12/31/2020  Component Date Value Ref Range Status  . Microalb, Ur 12/31/2020 39.7* 0.0 - 1.9 mg/dL Final  . Creatinine,U 12/31/2020 83.5  mg/dL Final  . Microalb Creat Ratio 12/31/2020 47.5* 0.0 - 30.0 mg/g Final  . Sodium 12/31/2020 141  135 - 145 mEq/L Final  .  Potassium 12/31/2020 4.4  3.5 - 5.1 mEq/L Final  . Chloride 12/31/2020 105  96 - 112 mEq/L Final  . CO2 12/31/2020 27  19 - 32 mEq/L Final  . Glucose, Bld 12/31/2020 99  70 - 99 mg/dL Final  . BUN 12/31/2020 19  6 - 23 mg/dL Final  . Creatinine, Ser 12/31/2020 1.07  0.40 - 1.50 mg/dL Final  . GFR 12/31/2020 71.42  >60.00 mL/min Final   Calculated using the CKD-EPI Creatinine Equation (2021)  . Calcium 12/31/2020 9.6  8.4 - 10.5 mg/dL Final  . Hgb A1c MFr Bld 12/31/2020 7.1* 4.6 - 6.5 % Final   Glycemic Control Guidelines for People with Diabetes:Non Diabetic:  <6%Goal of Therapy: <7%Additional Action Suggested:  >8%     Physical Examination:  BP 128/82 (BP Location: Right Arm,  Patient Position: Sitting, Cuff Size: Normal)   Pulse (!) 127   Ht '5\' 11"'  (1.803 m)   Wt (!) 304 lb (137.9 kg)   SpO2 98%   BMI 42.40 kg/m   Diabetes type 2, morbidly obese, non-insulin-dependent   See history of present illness for detailed discussion of his current management, blood sugar patterns and problems identified   His A1c is the same at 7.1 although has been previously low  Currently on 1 mg Ozempic, Synjardy XR and low-dose Amaryl  His blood sugars at home are generally fairly good and lower than expected for his A1c Only rarely has a high postprandial eating and lab fasting glucose was fairly good He is generally fairly good with his diet management Unable to exercise consistently or lose weight  HYPERTENSION: well controlled  History of microalbuminuria: Although if this is slightly better it is still persistent Explained that this is possibly diabetes related and discussed implications Renal function normal   PLAN:  He will continue 1 mg Ozempic since she has had fairly significant benefits and blood sugars are not higher He will periodically check fasting readings also To call if he has consistently high readings To call if he is getting any unusually high or low readings May need  to compare his home meter to lab glucose at the same time to make sure it is accurate  Increase losartan to 75 mg daily in split doses Monitor blood pressure periodically at home and with other physicians on follow up  There are no Patient Instructions on file for this visit.      Elayne Snare 01/03/2021, 8:20 AM   Note: This office note was prepared with Dragon voice recognition system technology. Any transcriptional errors that result from this process are unintentional.

## 2021-01-03 ENCOUNTER — Other Ambulatory Visit: Payer: Self-pay

## 2021-01-03 ENCOUNTER — Ambulatory Visit (INDEPENDENT_AMBULATORY_CARE_PROVIDER_SITE_OTHER): Payer: BC Managed Care – PPO | Admitting: Endocrinology

## 2021-01-03 ENCOUNTER — Encounter: Payer: Self-pay | Admitting: Endocrinology

## 2021-01-03 VITALS — BP 128/82 | HR 127 | Ht 71.0 in | Wt 304.0 lb

## 2021-01-03 DIAGNOSIS — I10 Essential (primary) hypertension: Secondary | ICD-10-CM

## 2021-01-03 DIAGNOSIS — E1129 Type 2 diabetes mellitus with other diabetic kidney complication: Secondary | ICD-10-CM

## 2021-01-03 DIAGNOSIS — R809 Proteinuria, unspecified: Secondary | ICD-10-CM

## 2021-01-03 DIAGNOSIS — E1165 Type 2 diabetes mellitus with hyperglycemia: Secondary | ICD-10-CM

## 2021-01-03 MED ORDER — LOSARTAN POTASSIUM 50 MG PO TABS: ORAL_TABLET | ORAL | 1 refills | Status: DC

## 2021-01-03 NOTE — Patient Instructions (Signed)
Take Losartan extra 1/2 pill at dinner

## 2021-01-08 ENCOUNTER — Other Ambulatory Visit (HOSPITAL_COMMUNITY): Payer: Self-pay | Admitting: Nurse Practitioner

## 2021-01-08 ENCOUNTER — Other Ambulatory Visit: Payer: Self-pay | Admitting: Internal Medicine

## 2021-01-08 DIAGNOSIS — I10 Essential (primary) hypertension: Secondary | ICD-10-CM

## 2021-01-08 DIAGNOSIS — E785 Hyperlipidemia, unspecified: Secondary | ICD-10-CM

## 2021-01-10 ENCOUNTER — Other Ambulatory Visit: Payer: Self-pay | Admitting: Internal Medicine

## 2021-01-10 DIAGNOSIS — E785 Hyperlipidemia, unspecified: Secondary | ICD-10-CM

## 2021-02-01 ENCOUNTER — Other Ambulatory Visit: Payer: Self-pay | Admitting: Endocrinology

## 2021-02-26 ENCOUNTER — Encounter (HOSPITAL_COMMUNITY): Payer: Self-pay

## 2021-02-26 ENCOUNTER — Emergency Department (HOSPITAL_COMMUNITY)
Admission: EM | Admit: 2021-02-26 | Discharge: 2021-02-26 | Disposition: A | Discharge status: Home / Self Care | Payer: BC Managed Care – PPO | Attending: Emergency Medicine | Admitting: Emergency Medicine

## 2021-02-26 ENCOUNTER — Other Ambulatory Visit: Payer: Self-pay

## 2021-02-26 DIAGNOSIS — I10 Essential (primary) hypertension: Secondary | ICD-10-CM | POA: Diagnosis not present

## 2021-02-26 DIAGNOSIS — Z8546 Personal history of malignant neoplasm of prostate: Secondary | ICD-10-CM | POA: Diagnosis not present

## 2021-02-26 DIAGNOSIS — Z7984 Long term (current) use of oral hypoglycemic drugs: Secondary | ICD-10-CM | POA: Diagnosis not present

## 2021-02-26 DIAGNOSIS — Z79899 Other long term (current) drug therapy: Secondary | ICD-10-CM | POA: Diagnosis not present

## 2021-02-26 DIAGNOSIS — E119 Type 2 diabetes mellitus without complications: Secondary | ICD-10-CM | POA: Diagnosis not present

## 2021-02-26 DIAGNOSIS — Z87891 Personal history of nicotine dependence: Secondary | ICD-10-CM | POA: Diagnosis not present

## 2021-02-26 DIAGNOSIS — R319 Hematuria, unspecified: Secondary | ICD-10-CM | POA: Diagnosis not present

## 2021-02-26 NOTE — ED Notes (Signed)
Patient standing in doorway yelling at staff to leave. Patient did not want his AVS paperwork.

## 2021-02-26 NOTE — ED Triage Notes (Signed)
Patient states he has a history of prostate cancer and started noticing blood in his urine from last night. Patient is on a blood thinner. Concerned for retention from a blood clot due to his history.

## 2021-02-26 NOTE — Discharge Instructions (Addendum)
Return for any problem.  ?

## 2021-02-26 NOTE — ED Notes (Signed)
Patient was bladder scanned 3 times. The reading was 0 all 3 times.

## 2021-02-26 NOTE — ED Provider Notes (Signed)
[facility] DEPT Provider Note   CSN: [id number] Arrival date & time: 02/26/21  1955     History Chief Complaint  Patient presents with   Hematuria    Tommy Yang is a 70 y.o. male.  70 year old male with prior medical history as detailed below presents for evaluation.  Patient reports intermittent bloody urine over the last 4 to 5 days.  Patient reports symptoms earlier this afternoon.  He was concerned that he may be retaining urine.  He denies current abdominal pain.  He reports that his last passage of urine was approximately 2 to 3 hours prior to arrival.  He denies fever.  He denies significant blood in his urine.  He is well-known to urology -Dr. Tresa Moore.  The history is provided by the patient and medical records.  Hematuria This is a new problem. The current episode started more than 2 days ago. The problem occurs rarely. The problem has been gradually improving. Pertinent negatives include no chest pain and no abdominal pain. Nothing aggravates the symptoms.      Past Medical History:  Diagnosis Date   Arthritis    Atrial fibrillation ([place])    Complication of anesthesia    needs to sit up  at 45 degree angle or gets afib during surgery or in recovery   Diabetes mellitus without complication ([place])    type 2   DVT (deep venous thrombosis) ([place]) 2015   left leg.behind knee   Foley catheter in place last changed 2eeks ago   last 7 months   GERD (gastroesophageal reflux disease)    Hx of radiation therapy 40 tx 2015   Hypercholesterolemia    Hypertension    Prostate cancer ([place]) 12/06/13   gleason 4+3=7, 6/12 cores positive. seed implant and radiation   Tourette's syndrome    Urinary retention    Wears glasses     Patient Active Problem List   Diagnosis Date Noted   Recurrent UTI 11/22/2019   Urinary retention 09/30/2019   Bilateral hydronephrosis 08/12/2019   Obstructive uropathy 08/11/2019   Acute lower UTI  08/11/2019   AKI (acute kidney injury) ([place]) 08/11/2019   Acute urinary retention 08/11/2019   Dysuria 08/10/2019   Constipation 08/10/2019   Low back pain 08/10/2019   Paroxysmal atrial fibrillation with RVR ([place]) 05/23/2019   Paroxysmal atrial fibrillation ([place]) 05/23/2019   Preventative health care 02/24/2019   Leg wound, right 02/24/2019   Essential hypertension 09/24/2018   Persistent atrial fibrillation    Microalbuminuria 04/23/2016   Rectal bleeding 07/18/2015   Dysphagia 07/18/2015   Chronic anticoagulation 03/14/2015   Morbid obesity ([place]) 03/14/2015   Exertional shortness of breath 01/12/2015   Abnormal ECG 01/12/2015   Encounter for screening colonoscopy 05/09/2014   Esophageal reflux 05/09/2014   Other dysphagia 05/09/2014   Evaluate for OSA (obstructive sleep apnea) 02/28/2014   Hyperlipidemia 02/28/2014   Prostate cancer ([place]) 12/14/2013   Erectile dysfunction 10/26/2013   Type II diabetes mellitus, uncontrolled ([place]) 10/19/2013   DVT (deep venous thrombosis) ([place])    Left leg DVT ([place]) 09/28/2013   Leg edema, left 09/28/2013    Past Surgical History:  Procedure Laterality Date   APPENDECTOMY  1968   BALLOON DILATION N/A 08/02/2015   Procedure: BALLOON DILATION;  Surgeon: [name], MD;  Location: [facility];  Service: Endoscopy;  Laterality: N/A;   CARDIOVERSION N/A 05/13/2018   Procedure: CARDIOVERSION;  Surgeon: Sanda Klein, MD;  Location: [facility];  Service: Cardiovascular;  Laterality: N/A;   CARDIOVERSION N/A 05/18/2018   Procedure: CARDIOVERSION;  Surgeon: Buford Dresser, MD;  Location: [facility];  Service: Cardiovascular;  Laterality: N/A;   COLONOSCOPY WITH PROPOFOL N/A 08/02/2015   Procedure: COLONOSCOPY WITH PROPOFOL w/ APC;  Surgeon: [name], MD;  Location: [facility];  Service: Endoscopy;  Laterality: N/A;   CYSTOSCOPY N/A 10/05/2020   Procedure: CYSTOSCOPY;  Surgeon: Alexis Frock, MD;  Location: [facility];  Service: Urology;  Laterality: N/A;   ESOPHAGOGASTRODUODENOSCOPY (EGD) WITH PROPOFOL N/A 08/02/2015   Procedure: ESOPHAGOGASTRODUODENOSCOPY (EGD) WITH PROPOFOL/ possible dilation.;  Surgeon: [name], MD;  Location: [facility];  Service: Endoscopy;  Laterality: N/A;   KNEE SURGERY  2001   right knee orthoscopic   PROSTATE BIOPSY  12/06/13   Gleason 4+3=7, volume 35 gm   SPINE SURGERY  1963 and 1967   Tourette'ssyndrome spinal fluid removed    TONSILLECTOMY  as child   TRANSURETHRAL RESECTION OF PROSTATE N/A 09/30/2019   Procedure: TRANSURETHRAL RESECTION OF THE PROSTATE (TURP);  Surgeon: Alexis Frock, MD;  Location: [facility];  Service: Urology;  Laterality: N/A;  1 HR   TRANSURETHRAL RESECTION OF PROSTATE N/A 10/05/2020   Procedure: TRANSURETHRAL RESECTION OF THE PROSTATE (TURP);  Surgeon: Alexis Frock, MD;  Location: [facility];  Service: Urology;  Laterality: N/A;  1 HR       Family History  Problem Relation Age of Onset   Heart disease Mother        before age 58   Diabetes Mother    Hyperlipidemia Mother    Varicose Veins Mother    Hypertension Mother    Heart disease Father    Diabetes Father    Hyperlipidemia Father    Hypertension Father    Cancer Brother    COPD Brother    Diabetes Brother    Hyperlipidemia Brother    Hypertension Brother     Social History   Tobacco Use   Smoking status: Former    Packs/day: 0.50    Years: 23.00    Pack years: 11.50    Types: Cigarettes    Quit date: 08/01/2012    Years since quitting: 8.5   Smokeless tobacco: Never  Vaping Use   Vaping Use: Never used  Substance Use Topics   Alcohol use: Yes    Alcohol/week: 0.0 standard drinks    Comment: very seldom, maybe 6x per year   Drug use: Never    Home Medications Prior to Admission medications   Medication Sig Start Date End Date Taking? Authorizing Provider  atorvastatin (LIPITOR) 80 MG tablet Take 1 tablet (80  mg total) by mouth daily. 06/01/20   Elayne Snare, MD  blood glucose meter kit and supplies KIT Test blood sugar daily as directed. Dx code: E11.9 01/23/15   Darlyne Russian, MD  CALCIUM PO Take 1,000 tablets by mouth daily.     [provider]  CINNAMON PO Take 500 mg by mouth daily.     [provider]  Cyanocobalamin (VITAMIN B 12 PO) Take by mouth. 1000 mg daily    [provider]  docusate sodium (COLACE) 100 MG capsule Take 1 capsule (100 mg total) by mouth 2 (two) times daily. 10/06/20 10/06/21  Delight Hoh, MD  doxycycline (VIBRA-TABS) 100 MG tablet Take 100 mg by mouth 2 (two) times daily. 12/24/20   [provider]  Empagliflozin-metFORMIN HCl ER (SYNJARDY XR) 12.12-998 MG TB24 Take 2 tablets daily  07/17/20   Elayne Snare, MD  furosemide (LASIX) 20 MG tablet Take 1 tablet by mouth daily for the next 5 days then only as needed for weight gain/swelling 10/15/20   Sherran Needs, NP  glimepiride (AMARYL) 1 MG tablet TAKE 1 TABLET (1 MG TOTAL) BY MOUTH DAILY WITH BREAKFAST. 10/30/20   Elayne Snare, MD  glucose blood (ACCU-CHEK GUIDE) test strip Use as instructed to check blood sugar twice daily. 10/29/18   Elayne Snare, MD  leuprolide (LUPRON) 11.25 MG injection Inject 11.25 mg into the muscle every 6 (six) months.     [provider]  losartan (COZAAR) 50 MG tablet 1 tablet in the morning and half tablet in the evening 01/03/21   Elayne Snare, MD  metoprolol tartrate (LOPRESSOR) 100 MG tablet TAKE 1 TABLET BY MOUTH TWICE A DAY 01/08/21   Sherran Needs, NP  OZEMPIC, 1 MG/DOSE, 4 MG/3ML SOPN INJECT 1 MG INTO THE SKIN ONCE A WEEK. FRIDAYS 02/01/21   Elayne Snare, MD  pantoprazole (PROTONIX) 40 MG tablet TAKE 1 TABLET BY MOUTH EVERY DAY 01/08/21   [name], MD  sulfamethoxazole-trimethoprim (BACTRIM DS) 800-160 MG tablet Take 1 tablet by mouth 2 (two) times daily. X 2 days. Begin day before next Urology appontment 10/05/20   Alexis Frock, MD  tamsulosin  (FLOMAX) 0.4 MG CAPS capsule Take 1 capsule (0.4 mg total) by mouth daily. Patient taking differently: Take 0.4 mg by mouth 2 (two) times daily. 08/14/19   Shawna Clamp, MD  traMADol (ULTRAM) 50 MG tablet Take 1 tablet (50 mg total) by mouth every 6 (six) hours as needed for moderate pain or severe pain. Post-operatively 10/05/20 10/05/21  Alexis Frock, MD  Vitamin D, Cholecalciferol, 400 UNITS TABS Take 400 Units by mouth daily.    [provider]  XARELTO 20 MG TABS tablet TAKE 1 TABLET (20 MG TOTAL) BY MOUTH DAILY BEFORE BREAKFAST. 07/02/20   [name], MD  zinc gluconate 50 MG tablet Take 50 mg by mouth daily.    [provider]    Allergies    Other  Review of Systems   Review of Systems  Cardiovascular:  Negative for chest pain.  Gastrointestinal:  Negative for abdominal pain.  Genitourinary:  Positive for hematuria.  All other systems reviewed and are negative.  Physical Exam Updated Vital Signs BP 111/90 (BP Location: Left Arm)   Pulse 100   Temp 98.3 F (36.8 C) (Oral)   Resp 19   SpO2 97%   Physical Exam Vitals and nursing note reviewed.  Constitutional:      General: He is not in acute distress.    Appearance: Normal appearance. He is well-developed.  HENT:     Head: Normocephalic and atraumatic.  Eyes:     Conjunctiva/sclera: Conjunctivae normal.     Pupils: Pupils are equal, round, and reactive to light.  Cardiovascular:     Rate and Rhythm: Normal rate and regular rhythm.     Heart sounds: Normal heart sounds.  Pulmonary:     Effort: Pulmonary effort is normal. No respiratory distress.     Breath sounds: Normal breath sounds.  Abdominal:     General: There is no distension.     Palpations: Abdomen is soft.     Tenderness: There is no abdominal tenderness.  Musculoskeletal:        General: No deformity. Normal range of motion.     Cervical back: Normal range of motion and neck supple.  Skin:  General: Skin is warm and dry.   Neurological:     General: No focal deficit present.     Mental Status: He is alert and oriented to person, place, and time.    ED Results / Procedures / Treatments   Labs (all labs ordered are listed, but only abnormal results are displayed) Labs Reviewed  URINALYSIS, ROUTINE W REFLEX MICROSCOPIC    EKG None  Radiology No results found.  Procedures Procedures   Medications Ordered in ED Medications - No data to display  ED Course  I have reviewed the triage vital signs and the nursing notes.  Pertinent labs & imaging results that were available during my care of the patient were reviewed by me and considered in my medical decision making (see chart for details).    MDM Rules/Calculators/A&P                          MDM  MSE complete  TAMIR WALLMAN was evaluated in Emergency Department on 02/26/2021 for the symptoms described in the history of present illness. He was evaluated in the context of the global COVID-19 pandemic, which necessitated consideration that the patient might be at risk for infection with the SARS-CoV-2 virus that causes COVID-19. Institutional protocols and algorithms that pertain to the evaluation of patients at risk for COVID-19 are in a state of rapid change based on information released by regulatory bodies including the CDC and federal and state organizations. These policies and algorithms were followed during the patient's care in the ED.  Patient presented with concern for possible urinary retention.  This is in the setting of observed hematuria.  Bladder scan was without evidence of urinary retention.  Patient was advised that we should check a UA.  Patient feels improved.  He denies pain.  He would prefer that he follow-up with his urologist tomorrow and provide the UA there.  Patient has established follow-up with Dr. Tresa Moore.  Patient plans on presenting to the urology office tomorrow morning and will provide UA at that  time.  Importance of close follow-up is stressed.  Strict return precautions given and understood.  Final Clinical Impression(s) / ED Diagnoses Final diagnoses:  Hematuria, unspecified type    Rx / DC Orders ED Discharge Orders     None        Valarie Merino, MD 02/26/21 2135

## 2021-02-27 ENCOUNTER — Encounter (HOSPITAL_COMMUNITY): Payer: Self-pay

## 2021-02-27 ENCOUNTER — Emergency Department (HOSPITAL_COMMUNITY)
Admission: EM | Admit: 2021-02-27 | Discharge: 2021-02-27 | Disposition: A | Discharge status: Home / Self Care | Payer: BC Managed Care – PPO | Attending: Emergency Medicine | Admitting: Emergency Medicine

## 2021-02-27 DIAGNOSIS — R339 Retention of urine, unspecified: Secondary | ICD-10-CM | POA: Diagnosis not present

## 2021-02-27 DIAGNOSIS — Z79899 Other long term (current) drug therapy: Secondary | ICD-10-CM | POA: Insufficient documentation

## 2021-02-27 DIAGNOSIS — E119 Type 2 diabetes mellitus without complications: Secondary | ICD-10-CM | POA: Insufficient documentation

## 2021-02-27 DIAGNOSIS — I10 Essential (primary) hypertension: Secondary | ICD-10-CM | POA: Insufficient documentation

## 2021-02-27 DIAGNOSIS — N390 Urinary tract infection, site not specified: Secondary | ICD-10-CM | POA: Diagnosis not present

## 2021-02-27 DIAGNOSIS — B9689 Other specified bacterial agents as the cause of diseases classified elsewhere: Secondary | ICD-10-CM | POA: Diagnosis not present

## 2021-02-27 DIAGNOSIS — Z7984 Long term (current) use of oral hypoglycemic drugs: Secondary | ICD-10-CM | POA: Insufficient documentation

## 2021-02-27 DIAGNOSIS — Z7901 Long term (current) use of anticoagulants: Secondary | ICD-10-CM | POA: Diagnosis not present

## 2021-02-27 DIAGNOSIS — Z87891 Personal history of nicotine dependence: Secondary | ICD-10-CM | POA: Insufficient documentation

## 2021-02-27 DIAGNOSIS — I480 Paroxysmal atrial fibrillation: Secondary | ICD-10-CM | POA: Insufficient documentation

## 2021-02-27 DIAGNOSIS — Z8546 Personal history of malignant neoplasm of prostate: Secondary | ICD-10-CM | POA: Insufficient documentation

## 2021-02-27 LAB — URINALYSIS, ROUTINE W REFLEX MICROSCOPIC
Bilirubin Urine: NEGATIVE
Glucose, UA: >=500 mg/dL — AB
Ketones, ur: NEGATIVE mg/dL
Leukocytes,Ua: NEGATIVE
Nitrite: NEGATIVE
Protein, ur: 100 mg/dL — AB
RBC / HPF: >50 RBC/hpf — ABNORMAL HIGH (ref 0–5)
Specific Gravity, Urine: 1.02 (ref 1.005–1.030)
pH: 6 (ref 5.0–8.0)

## 2021-02-27 MED ORDER — CEPHALEXIN 500 MG PO CAPS
500.0000 mg | ORAL_CAPSULE | Freq: Once | ORAL | Status: AC
  Administered 2021-02-27: 500 mg via ORAL
  Filled 2021-02-27: qty 1

## 2021-02-27 MED ORDER — CEPHALEXIN 500 MG PO CAPS: 500.0000 mg | ORAL_CAPSULE | Freq: Four times a day (QID) | ORAL | 0 refills | Status: AC

## 2021-02-27 NOTE — Discharge Instructions (Addendum)
1. Medications: Kefelx, usual home medications 2. Treatment: rest, drink plenty of fluids, keep catheter clean 3. Follow Up: Please followup with urology in 1-2 days for discussion of your diagnoses and further evaluation after today's visit; if you do not have a primary care doctor use the resource guide provided to find one; Please return to the ER for clotting of the Foley catheter, fevers, abdominal pain or other concerns.

## 2021-02-27 NOTE — ED Provider Notes (Signed)
[facility] DEPT Provider Note   CSN: [id number] Arrival date & time: 02/27/21  0004     History Chief Complaint  Patient presents with   Urinary Retention    Tommy Yang is a 70 y.o. male presents to the Emergency Department complaining of gradual, persistent, progressively worsening lower abdominal pain and urinary retention onset this morning.  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  Reports he had to have several surgeries and has previously required Foley catheters.  States that he presented this morning for similar symptoms but was able to urinate a little bit at that time.  Did not want Foley catheter and elected to see urology tomorrow.  Reports he went home and drink a cup of coffee and five 16 ounce glasses of water.  He reports since that time he has had increasing pressure in his lower abdomen but has been unable to urinate.  Reports that occasionally he can dribble out just a little bit but it is full of blood.  No other aggravating or alleviating factors.  Reports that he cannot wait until tomorrow to see urology as his pain is worsening.  Denies fevers or chills, nausea or vomiting.     The history is provided by the patient and medical records. No language interpreter was used.      Past Medical History:  Diagnosis Date   Arthritis    Atrial fibrillation ([place])    Complication of anesthesia    needs to sit up  at 45 degree angle or gets afib during surgery or in recovery   Diabetes mellitus without complication ([place])    type 2   DVT (deep venous thrombosis) ([place]) 2015   left leg.behind knee   Foley catheter in place last changed 2eeks ago   last 7 months   GERD (gastroesophageal reflux disease)    Hx of radiation therapy 40 tx 2015   Hypercholesterolemia    Hypertension    Prostate cancer ([place]) 12/06/13   gleason 4+3=7, 6/12 cores positive. seed implant and radiation   Tourette's  syndrome    Urinary retention    Wears glasses     Patient Active Problem List   Diagnosis Date Noted   Recurrent UTI 11/22/2019   Urinary retention 09/30/2019   Bilateral hydronephrosis 08/12/2019   Obstructive uropathy 08/11/2019   Acute lower UTI 08/11/2019   AKI (acute kidney injury) ([place]) 08/11/2019   Acute urinary retention 08/11/2019   Dysuria 08/10/2019   Constipation 08/10/2019   Low back pain 08/10/2019   Paroxysmal atrial fibrillation with RVR ([place]) 05/23/2019   Paroxysmal atrial fibrillation ([place]) 05/23/2019   Preventative health care 02/24/2019   Leg wound, right 02/24/2019   Essential hypertension 09/24/2018   Persistent atrial fibrillation    Microalbuminuria 04/23/2016   Rectal bleeding 07/18/2015   Dysphagia 07/18/2015   Chronic anticoagulation 03/14/2015   Morbid obesity ([place]) 03/14/2015   Exertional shortness of breath 01/12/2015   Abnormal ECG 01/12/2015   Encounter for screening colonoscopy 05/09/2014   Esophageal reflux 05/09/2014   Other dysphagia 05/09/2014   Evaluate for OSA (obstructive sleep apnea) 02/28/2014   Hyperlipidemia 02/28/2014   Prostate cancer ([place]) 12/14/2013   Erectile dysfunction 10/26/2013   Type II diabetes mellitus, uncontrolled ([place]) 10/19/2013   DVT (deep venous thrombosis) ([place])    Left leg DVT ([place]) 09/28/2013   Leg edema, left 09/28/2013    Past Surgical History:  Procedure Laterality Date   APPENDECTOMY  [place] N/A 08/02/2015   Procedure: BALLOON DILATION;  Surgeon: [name], MD;  Location: [facility];  Service: Endoscopy;  Laterality: N/A;   CARDIOVERSION N/A 05/13/2018   Procedure: CARDIOVERSION;  Surgeon: Sanda Klein, MD;  Location: [facility];  Service: Cardiovascular;  Laterality: N/A;   CARDIOVERSION N/A 05/18/2018   Procedure: CARDIOVERSION;  Surgeon: Buford Dresser, MD;  Location: [facility];  Service: Cardiovascular;  Laterality: N/A;   COLONOSCOPY WITH PROPOFOL N/A  08/02/2015   Procedure: COLONOSCOPY WITH PROPOFOL w/ APC;  Surgeon: [name], MD;  Location: [facility];  Service: Endoscopy;  Laterality: N/A;   CYSTOSCOPY N/A 10/05/2020   Procedure: CYSTOSCOPY;  Surgeon: Alexis Frock, MD;  Location: [facility];  Service: Urology;  Laterality: N/A;   ESOPHAGOGASTRODUODENOSCOPY (EGD) WITH PROPOFOL N/A 08/02/2015   Procedure: ESOPHAGOGASTRODUODENOSCOPY (EGD) WITH PROPOFOL/ possible dilation.;  Surgeon: [name], MD;  Location: [facility];  Service: Endoscopy;  Laterality: N/A;   KNEE SURGERY  2001   right knee orthoscopic   PROSTATE BIOPSY  12/06/13   Gleason 4+3=7, volume 35 gm   SPINE SURGERY  1963 and 1967   Tourette'ssyndrome spinal fluid removed    TONSILLECTOMY  as child   TRANSURETHRAL RESECTION OF PROSTATE N/A 09/30/2019   Procedure: TRANSURETHRAL RESECTION OF THE PROSTATE (TURP);  Surgeon: Alexis Frock, MD;  Location: [facility];  Service: Urology;  Laterality: N/A;  1 HR   TRANSURETHRAL RESECTION OF PROSTATE N/A 10/05/2020   Procedure: TRANSURETHRAL RESECTION OF THE PROSTATE (TURP);  Surgeon: Alexis Frock, MD;  Location: [facility];  Service: Urology;  Laterality: N/A;  1 HR       Family History  Problem Relation Age of Onset   Heart disease Mother        before age 47   Diabetes Mother    Hyperlipidemia Mother    Varicose Veins Mother    Hypertension Mother    Heart disease Father    Diabetes Father    Hyperlipidemia Father    Hypertension Father    Cancer Brother    COPD Brother    Diabetes Brother    Hyperlipidemia Brother    Hypertension Brother     Social History   Tobacco Use   Smoking status: Former    Packs/day: 0.50    Years: 23.00    Pack years: 11.50    Types: Cigarettes    Quit date: 08/01/2012    Years since quitting: 8.5   Smokeless tobacco: Never  Vaping Use   Vaping Use: Never used  Substance Use Topics   Alcohol use: Yes    Alcohol/week:  0.0 standard drinks    Comment: very seldom, maybe 6x per year   Drug use: Never    Home Medications Prior to Admission medications   Medication Sig Start Date End Date Taking? Authorizing Provider  cephALEXin (KEFLEX) 500 MG capsule Take 1 capsule (500 mg total) by mouth 4 (four) times daily for 7 days. 02/27/21 03/06/21 Yes Deloise Marchant, Jarrett Soho, PA-C  atorvastatin (LIPITOR) 80 MG tablet Take 1 tablet (80 mg total) by mouth daily. 06/01/20   Elayne Snare, MD  blood glucose meter kit and supplies KIT Test blood sugar daily as directed. Dx code: E11.9 01/23/15   Darlyne Russian, MD  CALCIUM PO Take 1,000 tablets by mouth daily.     [provider]  CINNAMON PO Take 500 mg by mouth daily.     [provider]  Cyanocobalamin (VITAMIN B 12 PO) Take by mouth. 1000 mg daily    [provider]  docusate sodium (COLACE) 100 MG capsule Take 1 capsule (100 mg total) by mouth 2 (two) times daily. 10/06/20 10/06/21  Delight Hoh, MD  Empagliflozin-metFORMIN HCl ER (SYNJARDY XR) 12.12-998 MG TB24 Take 2 tablets daily 07/17/20   Elayne Snare, MD  furosemide (LASIX) 20 MG tablet Take 1 tablet by mouth daily for the next 5 days then only as needed for weight gain/swelling 10/15/20   Sherran Needs, NP  glimepiride (AMARYL) 1 MG tablet TAKE 1 TABLET (1 MG TOTAL) BY MOUTH DAILY WITH BREAKFAST. 10/30/20   Elayne Snare, MD  glucose blood (ACCU-CHEK GUIDE) test strip Use as instructed to check blood sugar twice daily. 10/29/18   Elayne Snare, MD  leuprolide (LUPRON) 11.25 MG injection Inject 11.25 mg into the muscle every 6 (six) months.     [provider]  losartan (COZAAR) 50 MG tablet 1 tablet in the morning and half tablet in the evening 01/03/21   Elayne Snare, MD  metoprolol tartrate (LOPRESSOR) 100 MG tablet TAKE 1 TABLET BY MOUTH TWICE A DAY 01/08/21   Sherran Needs, NP  OZEMPIC, 1 MG/DOSE, 4 MG/3ML SOPN INJECT 1 MG INTO THE SKIN ONCE A WEEK. FRIDAYS 02/01/21   Elayne Snare, MD   pantoprazole (PROTONIX) 40 MG tablet TAKE 1 TABLET BY MOUTH EVERY DAY 01/08/21   [name], MD  tamsulosin (FLOMAX) 0.4 MG CAPS capsule Take 1 capsule (0.4 mg total) by mouth daily. Patient taking differently: Take 0.4 mg by mouth 2 (two) times daily. 08/14/19   Shawna Clamp, MD  traMADol (ULTRAM) 50 MG tablet Take 1 tablet (50 mg total) by mouth every 6 (six) hours as needed for moderate pain or severe pain. Post-operatively 10/05/20 10/05/21  Alexis Frock, MD  Vitamin D, Cholecalciferol, 400 UNITS TABS Take 400 Units by mouth daily.    [provider]  XARELTO 20 MG TABS tablet TAKE 1 TABLET (20 MG TOTAL) BY MOUTH DAILY BEFORE BREAKFAST. 07/02/20   [name], MD  zinc gluconate 50 MG tablet Take 50 mg by mouth daily.    [provider]    Allergies    Other  Review of Systems   Review of Systems  Constitutional:  Negative for appetite change, diaphoresis, fatigue, fever and unexpected weight change.  HENT:  Negative for mouth sores.   Eyes:  Negative for visual disturbance.  Respiratory:  Negative for cough, chest tightness, shortness of breath and wheezing.   Cardiovascular:  Negative for chest pain.  Gastrointestinal:  Positive for abdominal pain. Negative for constipation, diarrhea, nausea and vomiting.  Endocrine: Negative for polydipsia, polyphagia and polyuria.  Genitourinary:  Positive for decreased urine volume, difficulty urinating and hematuria. Negative for dysuria, frequency and urgency.  Musculoskeletal:  Negative for back pain and neck stiffness.  Skin:  Negative for rash.  Allergic/Immunologic: Negative for immunocompromised state.  Neurological:  Negative for syncope, light-headedness and headaches.  Hematological:  Does not bruise/bleed easily.  Psychiatric/Behavioral:  Negative for sleep disturbance. The patient is not nervous/anxious.    Physical Exam Updated Vital Signs BP (!) 161/107 (BP Location: Left Arm)   Pulse 94   Temp 98.5 F  (36.9 C) (Oral)   Resp 18   SpO2 97%   Physical Exam Vitals and nursing note reviewed.  Constitutional:      General: He is not in acute distress.    Appearance: He is not  diaphoretic.  HENT:     Head: Normocephalic.  Eyes:     General: No scleral icterus.    Conjunctiva/sclera: Conjunctivae normal.  Cardiovascular:     Rate and Rhythm: Normal rate and regular rhythm.     Pulses: Normal pulses.          Radial pulses are 2+ on the right side and 2+ on the left side.  Pulmonary:     Effort: No tachypnea, accessory muscle usage, prolonged expiration, respiratory distress or retractions.     Breath sounds: No stridor.     Comments: Equal chest rise. No increased work of breathing. Abdominal:     General: There is no distension.     Palpations: Abdomen is soft.     Tenderness: There is abdominal tenderness in the suprapubic area. There is no guarding or rebound.     Comments: Exam limited by body habitus  Musculoskeletal:     Cervical back: Normal range of motion.     Comments: Moves all extremities equally and without difficulty.  Skin:    General: Skin is warm and dry.     Capillary Refill: Capillary refill takes less than 2 seconds.  Neurological:     Mental Status: He is alert.     GCS: GCS eye subscore is 4. GCS verbal subscore is 5. GCS motor subscore is 6.     Comments: Speech is clear and goal oriented.  Psychiatric:        Mood and Affect: Mood normal.    ED Results / Procedures / Treatments   Labs (all labs ordered are listed, but only abnormal results are displayed) Labs Reviewed  URINALYSIS, ROUTINE W REFLEX MICROSCOPIC - Abnormal; Notable for the following components:      Result Value   Color, Urine RED (*)    APPearance HAZY (*)    Glucose, UA >=500 (*)    Hgb urine dipstick MODERATE (*)    Protein, ur 100 (*)    RBC / HPF >50 (*)    Bacteria, UA RARE (*)    All other components within normal limits  URINE CULTURE    EKG None  Radiology No  results found.  Procedures BLADDER CATHETERIZATION  Date/Time: 02/27/2021 2:12 AM Performed by: Abigail Butts, PA-C Authorized by: Abigail Butts, PA-C   Consent:    Consent obtained:  Verbal   Consent given by:  Patient   Risks, benefits, and alternatives were discussed: yes     Risks discussed:  False passage, infection, urethral injury, incomplete procedure and pain   Alternatives discussed:  No treatment and referral Universal protocol:    Procedure explained and questions answered to patient or proxy's satisfaction: yes     Relevant documents present and verified: yes     Test results available: yes     Imaging studies available: yes     Required blood products, implants, devices, and special equipment available: yes     Site/side marked: yes     Immediately prior to procedure, a time out was called: yes     Patient identity confirmed:  Verbally with patient and arm band Pre-procedure details:    Procedure purpose:  Therapeutic   Preparation: Patient was prepped and draped in usual sterile fashion   Anesthesia:    Anesthesia method:  Topical application   Topical anesthetic:  Lidocaine gel Procedure details:    Provider performed due to:  Complicated insertion and nurse unable to complete   Catheter insertion:  Temporary indwelling  Catheter type:  Foley   Catheter size:  12 Fr   Bladder irrigation: yes     Number of attempts:  3   Urine characteristics:  Bloody Post-procedure details:    Procedure completion:  Tolerated Comments:     Initial attempt by RN with 16 French coud however unable to pass the catheter.  Second attempt by myself with 81 [place] coud without success.  Third attempt by myself with 12 French Foley catheter was successful with hematuria.  Bladder irrigated afterwards.   Medications Ordered in ED Medications  cephALEXin (KEFLEX) capsule 500 mg (500 mg Oral Given 02/27/21 0251)    ED Course  I have reviewed the triage vital signs  and the nursing notes.  Pertinent labs & imaging results that were available during my care of the patient were reviewed by me and considered in my medical decision making (see chart for details).    MDM Rules/Calculators/A&P                          Patient presents with acute urinary retention.  History of same.  Bladder scan does not show distended bladder however I suspect this is incorrect given patient's body habitus.  2:20 AM 1126m out at this time.  Patient continues to pass urine through the Foley catheter.  Reports he is feeling significantly better.  2:53 AM UA with questionable urinary tract infection.  Previous urine cultures showed E. coli susceptible to ceftriaxone.  Will treat with Keflex 4 times per day.  Patient continues to feel significantly better.  Vital signs have improved now that pain is improved.  BP 117/86 (BP Location: Left Arm)   Pulse 99   Temp 97.9 F (36.6 C) (Oral)   Resp 16   SpO2 97%   Patient is established with [facility].  He will call today to set up appointment for further evaluation.  Discussed reasons to return to the emergency department.  Patient states understanding and is in agreement with the plan.  Final Clinical Impression(s) / ED Diagnoses Final diagnoses:  Urinary retention  Lower urinary tract infectious disease    Rx / DC Orders ED Discharge Orders          Ordered    cephALEXin (KEFLEX) 500 MG capsule  4 times daily        02/27/21 0252             Shakesha Soltau, [place] PA-C 02/27/21 0255    BMaudie Flakes MD 02/27/21 [phone]

## 2021-02-27 NOTE — ED Triage Notes (Signed)
Patient returned saying he went home and drank a lot of water and still cannot urinate.

## 2021-02-28 LAB — URINE CULTURE: Culture: NO GROWTH

## 2021-03-01 ENCOUNTER — Other Ambulatory Visit: Payer: Self-pay

## 2021-03-01 ENCOUNTER — Encounter (HOSPITAL_COMMUNITY): Payer: Self-pay | Admitting: Emergency Medicine

## 2021-03-01 ENCOUNTER — Emergency Department (HOSPITAL_COMMUNITY)
Admission: EM | Admit: 2021-03-01 | Discharge: 2021-03-01 | Disposition: A | Discharge status: Home / Self Care | Payer: BC Managed Care – PPO | Attending: Emergency Medicine | Admitting: Emergency Medicine

## 2021-03-01 DIAGNOSIS — E119 Type 2 diabetes mellitus without complications: Secondary | ICD-10-CM | POA: Diagnosis not present

## 2021-03-01 DIAGNOSIS — Y828 Other medical devices associated with adverse incidents: Secondary | ICD-10-CM | POA: Diagnosis not present

## 2021-03-01 DIAGNOSIS — Z79899 Other long term (current) drug therapy: Secondary | ICD-10-CM | POA: Insufficient documentation

## 2021-03-01 DIAGNOSIS — Z87891 Personal history of nicotine dependence: Secondary | ICD-10-CM | POA: Diagnosis not present

## 2021-03-01 DIAGNOSIS — T83098A Other mechanical complication of other indwelling urethral catheter, initial encounter: Secondary | ICD-10-CM | POA: Diagnosis not present

## 2021-03-01 DIAGNOSIS — I10 Essential (primary) hypertension: Secondary | ICD-10-CM | POA: Insufficient documentation

## 2021-03-01 DIAGNOSIS — T83011A Breakdown (mechanical) of indwelling urethral catheter, initial encounter: Secondary | ICD-10-CM

## 2021-03-01 DIAGNOSIS — Z8546 Personal history of malignant neoplasm of prostate: Secondary | ICD-10-CM | POA: Insufficient documentation

## 2021-03-01 DIAGNOSIS — Z7984 Long term (current) use of oral hypoglycemic drugs: Secondary | ICD-10-CM | POA: Diagnosis not present

## 2021-03-01 LAB — URINALYSIS, ROUTINE W REFLEX MICROSCOPIC
Bilirubin Urine: NEGATIVE
Glucose, UA: >=500 mg/dL — AB
Ketones, ur: NEGATIVE mg/dL
Nitrite: NEGATIVE
Protein, ur: 100 mg/dL — AB
RBC / HPF: >50 RBC/hpf — ABNORMAL HIGH (ref 0–5)
Specific Gravity, Urine: 1.02 (ref 1.005–1.030)
pH: 6 (ref 5.0–8.0)

## 2021-03-01 NOTE — ED Triage Notes (Signed)
Pt states that he was here on Tuesday and had a catheter placed for urinary retention secondary to Prostate cancer. States the catheter has urine coming around it. Unsure if it is blocked. Alert and oriented.

## 2021-03-01 NOTE — ED Notes (Signed)
Bladder irrigated with 1L NS. Pink urine being returned. Small clots noted, but bag is draining appropriately

## 2021-03-01 NOTE — Discharge Instructions (Addendum)
Please call your urologist Monday morning to request close follow-up appointment.  Continue your antibiotic as previously prescribed.  Come back to ER if you have concerns for recurrent urinary retention or clogged Foley catheter.

## 2021-03-01 NOTE — ED Provider Notes (Signed)
Emergency Medicine Provider Triage Evaluation Note  Tommy Yang , a 70 y.o. male  was evaluated in triage.  Pt complains of possible urinary catheter dislodgment.  States that he is urinating around where the catheter is.  Denies any abdominal pain.  Review of Systems  Positive: Hematuria Negative: Abdominal pain  Physical Exam  BP (!) 141/99 (BP Location: Right Arm)   Pulse 91   Temp 98 F (36.7 C) (Oral)   Resp 20   Ht 5\' 11"  (1.803 m)   Wt 131.5 kg   SpO2 96%   BMI 40.45 kg/m  Gen:   Awake, no distress   Resp:  Normal effort  MSK:   Moves extremities without difficulty  Other:  Foley bag with hematuria  Medical Decision Making  Medically screening exam initiated at 6:05 PM.  Appropriate orders placed.  Tommy Yang was informed that the remainder of the evaluation will be completed by another provider, this initial triage assessment does not replace that evaluation, and the importance of remaining in the ED until their evaluation is complete.  Will need physical exam   Delia Heady, PA-C 03/01/21 1805    Lucrezia Starch, MD 03/02/21 1556

## 2021-03-02 ENCOUNTER — Emergency Department (HOSPITAL_COMMUNITY)
Admission: EM | Admit: 2021-03-02 | Discharge: 2021-03-02 | Disposition: A | Discharge status: Home / Self Care | Payer: BC Managed Care – PPO | Attending: Emergency Medicine | Admitting: Emergency Medicine

## 2021-03-02 DIAGNOSIS — Z87891 Personal history of nicotine dependence: Secondary | ICD-10-CM | POA: Insufficient documentation

## 2021-03-02 DIAGNOSIS — E119 Type 2 diabetes mellitus without complications: Secondary | ICD-10-CM | POA: Diagnosis not present

## 2021-03-02 DIAGNOSIS — Z79899 Other long term (current) drug therapy: Secondary | ICD-10-CM | POA: Diagnosis not present

## 2021-03-02 DIAGNOSIS — T83098A Other mechanical complication of other indwelling urethral catheter, initial encounter: Secondary | ICD-10-CM | POA: Diagnosis not present

## 2021-03-02 DIAGNOSIS — Z8546 Personal history of malignant neoplasm of prostate: Secondary | ICD-10-CM | POA: Insufficient documentation

## 2021-03-02 DIAGNOSIS — Y848 Other medical procedures as the cause of abnormal reaction of the patient, or of later complication, without mention of misadventure at the time of the procedure: Secondary | ICD-10-CM | POA: Insufficient documentation

## 2021-03-02 DIAGNOSIS — T83018A Breakdown (mechanical) of other indwelling urethral catheter, initial encounter: Secondary | ICD-10-CM

## 2021-03-02 DIAGNOSIS — I10 Essential (primary) hypertension: Secondary | ICD-10-CM | POA: Diagnosis not present

## 2021-03-02 DIAGNOSIS — Z7984 Long term (current) use of oral hypoglycemic drugs: Secondary | ICD-10-CM | POA: Insufficient documentation

## 2021-03-02 NOTE — ED Provider Notes (Signed)
[facility] DEPT Provider Note   CSN: [id number] Arrival date & time: 03/01/21  1740     History Chief Complaint  Patient presents with   Urinary Catheter Problems    Tommy Yang is a 70 y.o. male.  Presents to ER with concern for catheter problem.  Patient states that he had a catheter placed recently due to urinary retention.  He has scheduled follow-up next week with urology.  States that he became concerned today when he noticed that the catheter seem to be draining less and had some urine draining around the catheter.  Has been taking antibiotic as prescribed.  He denies any abdominal pain.  No chills or fevers, otherwise feels fine.  States that he skipped a day of his anticoagulation due to increased hematuria.  States that the hematuria has improved.  Has history of A. fib and DVT.  Completed chart review, recent ER visit for urinary retention, difficulty with Foley placement and ultimately a 12 French Foley catheter was placed.    HPI     Past Medical History:  Diagnosis Date   Arthritis    Atrial fibrillation ([place])    Complication of anesthesia    needs to sit up  at 45 degree angle or gets afib during surgery or in recovery   Diabetes mellitus without complication ([place])    type 2   DVT (deep venous thrombosis) ([place]) 2015   left leg.behind knee   Foley catheter in place last changed 2eeks ago   last 7 months   GERD (gastroesophageal reflux disease)    Hx of radiation therapy 40 tx 2015   Hypercholesterolemia    Hypertension    Prostate cancer ([place]) 12/06/13   gleason 4+3=7, 6/12 cores positive. seed implant and radiation   Tourette's syndrome    Urinary retention    Wears glasses     Patient Active Problem List   Diagnosis Date Noted   Recurrent UTI 11/22/2019   Urinary retention 09/30/2019   Bilateral hydronephrosis 08/12/2019   Obstructive uropathy 08/11/2019   Acute lower UTI 08/11/2019   AKI (acute kidney injury)  ([place]) 08/11/2019   Acute urinary retention 08/11/2019   Dysuria 08/10/2019   Constipation 08/10/2019   Low back pain 08/10/2019   Paroxysmal atrial fibrillation with RVR ([place]) 05/23/2019   Paroxysmal atrial fibrillation ([place]) 05/23/2019   Preventative health care 02/24/2019   Leg wound, right 02/24/2019   Essential hypertension 09/24/2018   Persistent atrial fibrillation    Microalbuminuria 04/23/2016   Rectal bleeding 07/18/2015   Dysphagia 07/18/2015   Chronic anticoagulation 03/14/2015   Morbid obesity ([place]) 03/14/2015   Exertional shortness of breath 01/12/2015   Abnormal ECG 01/12/2015   Encounter for screening colonoscopy 05/09/2014   Esophageal reflux 05/09/2014   Other dysphagia 05/09/2014   Evaluate for OSA (obstructive sleep apnea) 02/28/2014   Hyperlipidemia 02/28/2014   Prostate cancer ([place]) 12/14/2013   Erectile dysfunction 10/26/2013   Type II diabetes mellitus, uncontrolled ([place]) 10/19/2013   DVT (deep venous thrombosis) ([place])    Left leg DVT ([place]) 09/28/2013   Leg edema, left 09/28/2013    Past Surgical History:  Procedure Laterality Date   APPENDECTOMY  1968   BALLOON DILATION N/A 08/02/2015   Procedure: BALLOON DILATION;  Surgeon: [name], MD;  Location: [facility];  Service: Endoscopy;  Laterality: N/A;   CARDIOVERSION N/A 05/13/2018   Procedure: CARDIOVERSION;  Surgeon: Sanda Klein, MD;  Location: [facility];  Service: Cardiovascular;  Laterality: N/A;  CARDIOVERSION N/A 05/18/2018   Procedure: CARDIOVERSION;  Surgeon: Buford Dresser, MD;  Location: [facility];  Service: Cardiovascular;  Laterality: N/A;   COLONOSCOPY WITH PROPOFOL N/A 08/02/2015   Procedure: COLONOSCOPY WITH PROPOFOL w/ APC;  Surgeon: [name], MD;  Location: [facility];  Service: Endoscopy;  Laterality: N/A;   CYSTOSCOPY N/A 10/05/2020   Procedure: CYSTOSCOPY;  Surgeon: Alexis Frock, MD;  Location: [facility];  Service: Urology;   Laterality: N/A;   ESOPHAGOGASTRODUODENOSCOPY (EGD) WITH PROPOFOL N/A 08/02/2015   Procedure: ESOPHAGOGASTRODUODENOSCOPY (EGD) WITH PROPOFOL/ possible dilation.;  Surgeon: [name], MD;  Location: [facility];  Service: Endoscopy;  Laterality: N/A;   KNEE SURGERY  2001   right knee orthoscopic   PROSTATE BIOPSY  12/06/13   Gleason 4+3=7, volume 35 gm   SPINE SURGERY  1963 and 1967   Tourette'ssyndrome spinal fluid removed    TONSILLECTOMY  as child   TRANSURETHRAL RESECTION OF PROSTATE N/A 09/30/2019   Procedure: TRANSURETHRAL RESECTION OF THE PROSTATE (TURP);  Surgeon: Alexis Frock, MD;  Location: [facility];  Service: Urology;  Laterality: N/A;  1 HR   TRANSURETHRAL RESECTION OF PROSTATE N/A 10/05/2020   Procedure: TRANSURETHRAL RESECTION OF THE PROSTATE (TURP);  Surgeon: Alexis Frock, MD;  Location: [facility];  Service: Urology;  Laterality: N/A;  1 HR       Family History  Problem Relation Age of Onset   Heart disease Mother        before age 68   Diabetes Mother    Hyperlipidemia Mother    Varicose Veins Mother    Hypertension Mother    Heart disease Father    Diabetes Father    Hyperlipidemia Father    Hypertension Father    Cancer Brother    COPD Brother    Diabetes Brother    Hyperlipidemia Brother    Hypertension Brother     Social History   Tobacco Use   Smoking status: Former    Packs/day: 0.50    Years: 23.00    Pack years: 11.50    Types: Cigarettes    Quit date: 08/01/2012    Years since quitting: 8.5   Smokeless tobacco: Never  Vaping Use   Vaping Use: Never used  Substance Use Topics   Alcohol use: Yes    Alcohol/week: 0.0 standard drinks    Comment: very seldom, maybe 6x per year   Drug use: Never    Home Medications Prior to Admission medications   Medication Sig Start Date End Date Taking? Authorizing Provider  amLODipine (NORVASC) 2.5 MG tablet Take 2.5 mg by mouth daily. 11/12/20   [provider]  atorvastatin (LIPITOR) 80 MG tablet Take 1 tablet (80 mg total) by mouth daily. 06/01/20   Elayne Snare, MD  blood glucose meter kit and supplies KIT Test blood sugar daily as directed. Dx code: E11.9 01/23/15   Darlyne Russian, MD  CALCIUM PO Take 1,000 tablets by mouth daily.     [provider]  cephALEXin (KEFLEX) 500 MG capsule Take 1 capsule (500 mg total) by mouth 4 (four) times daily for 7 days. 02/27/21 03/06/21  Muthersbaugh, Jarrett Soho, PA-C  CINNAMON PO Take 500 mg by mouth daily.     [provider]  Cyanocobalamin (VITAMIN B 12 PO) Take by mouth. 1000 mg daily    [provider]  docusate sodium (COLACE) 100 MG capsule Take 1 capsule (100 mg total) by mouth 2 (two) times  daily. 10/06/20 10/06/21  Delight Hoh, MD  Empagliflozin-metFORMIN HCl ER (SYNJARDY XR) 12.12-998 MG TB24 Take 2 tablets daily 07/17/20   Elayne Snare, MD  ERLEADA 60 MG tablet Take 240 mg by mouth daily. 01/30/21   [provider]  furosemide (LASIX) 20 MG tablet Take 1 tablet by mouth daily for the next 5 days then only as needed for weight gain/swelling 10/15/20   Sherran Needs, NP  glimepiride (AMARYL) 1 MG tablet TAKE 1 TABLET (1 MG TOTAL) BY MOUTH DAILY WITH BREAKFAST. 10/30/20   Elayne Snare, MD  glucose blood (ACCU-CHEK GUIDE) test strip Use as instructed to check blood sugar twice daily. 10/29/18   Elayne Snare, MD  leuprolide (LUPRON) 11.25 MG injection Inject 11.25 mg into the muscle every 6 (six) months.     [provider]  losartan (COZAAR) 50 MG tablet 1 tablet in the morning and half tablet in the evening 01/03/21   Elayne Snare, MD  metoprolol tartrate (LOPRESSOR) 100 MG tablet TAKE 1 TABLET BY MOUTH TWICE A DAY 01/08/21   Sherran Needs, NP  OZEMPIC, 1 MG/DOSE, 4 MG/3ML SOPN INJECT 1 MG INTO THE SKIN ONCE A WEEK. FRIDAYS 02/01/21   Elayne Snare, MD  pantoprazole (PROTONIX) 40 MG tablet TAKE 1 TABLET BY MOUTH EVERY DAY 01/08/21   [name], MD  tamsulosin  (FLOMAX) 0.4 MG CAPS capsule Take 1 capsule (0.4 mg total) by mouth daily. Patient taking differently: Take 0.4 mg by mouth 2 (two) times daily. 08/14/19   Shawna Clamp, MD  traMADol (ULTRAM) 50 MG tablet Take 1 tablet (50 mg total) by mouth every 6 (six) hours as needed for moderate pain or severe pain. Post-operatively 10/05/20 10/05/21  Alexis Frock, MD  Vitamin D, Cholecalciferol, 400 UNITS TABS Take 400 Units by mouth daily.    [provider]  XARELTO 20 MG TABS tablet TAKE 1 TABLET (20 MG TOTAL) BY MOUTH DAILY BEFORE BREAKFAST. 07/02/20   [name], MD  zinc gluconate 50 MG tablet Take 50 mg by mouth daily.    [provider]    Allergies    Other  Review of Systems   Review of Systems  Constitutional:  Negative for chills and fever.  HENT:  Negative for ear pain and sore throat.   Eyes:  Negative for pain and visual disturbance.  Respiratory:  Negative for cough and shortness of breath.   Cardiovascular:  Negative for chest pain and palpitations.  Gastrointestinal:  Negative for abdominal pain and vomiting.  Genitourinary:  Positive for hematuria. Negative for dysuria.  Musculoskeletal:  Negative for arthralgias and back pain.  Skin:  Negative for color change and rash.  Neurological:  Negative for seizures and syncope.  All other systems reviewed and are negative.  Physical Exam Updated Vital Signs BP (!) 157/97   Pulse (!) 102   Temp 98 F (36.7 C) (Oral)   Resp 18   Ht '5\' 11"'  (1.803 m)   Wt 131.5 kg   SpO2 98%   BMI 40.45 kg/m   Physical Exam Vitals and nursing note reviewed.  Constitutional:      Appearance: He is well-developed.  HENT:     Head: Normocephalic and atraumatic.  Eyes:     Conjunctiva/sclera: Conjunctivae normal.  Cardiovascular:     Rate and Rhythm: Normal rate and regular rhythm.     Heart sounds: No murmur heard. Pulmonary:     Effort: Pulmonary effort is normal. No respiratory distress.  Breath sounds: Normal  breath sounds.  Abdominal:     Palpations: Abdomen is soft.     Tenderness: There is no abdominal tenderness.  Genitourinary:    Comments: Foley catheter is intact, penis appears grossly normal, no blood noted at tip of penis, urine in Foley bag has slight tinge of blood Musculoskeletal:     Cervical back: Neck supple.  Skin:    General: Skin is warm and dry.  Neurological:     Mental Status: He is alert.    ED Results / Procedures / Treatments   Labs (all labs ordered are listed, but only abnormal results are displayed) Labs Reviewed  URINALYSIS, ROUTINE W REFLEX MICROSCOPIC - Abnormal; Notable for the following components:      Result Value   Color, Urine RED (*)    APPearance CLOUDY (*)    Glucose, UA >=500 (*)    Hgb urine dipstick MODERATE (*)    Protein, ur 100 (*)    Leukocytes,Ua TRACE (*)    RBC / HPF >50 (*)    Bacteria, UA FEW (*)    All other components within normal limits    EKG None  Radiology No results found.  Procedures Procedures   Medications Ordered in ED Medications - No data to display  ED Course  I have reviewed the triage vital signs and the nursing notes.  Pertinent labs & imaging results that were available during my care of the patient were reviewed by me and considered in my medical decision making (see chart for details).    MDM Rules/Calculators/A&P                          70 year old male presents to ER with concern for decreased Foley output and urine draining around the Foley.  Suspect Foley may have been clogged.  Nurse performed irrigation and the catheter started draining without any difficulty.  Patient did not have any ongoing urinary retention.  Recommend patient follow-up closely with urology.  Given the current Foley catheter is functioning appropriately, recommend that he keep this in place until following up early next week with urology.  Urinalysis noted moderate blood, few bacteria and trace leukocytes.  For now,  recommend patient continue the previously prescribed Keflex.  Discharged home.   After the discussed management above, the patient was determined to be safe for discharge.  The patient was in agreement with this plan and all questions regarding their care were answered.  ED return precautions were discussed and the patient will return to the ED with any significant worsening of condition.  Final Clinical Impression(s) / ED Diagnoses Final diagnoses:  Malfunction of indwelling urinary catheter, initial encounter [facility])    Rx / DC Orders ED Discharge Orders     None        Lucrezia Starch, MD 03/02/21 1524

## 2021-03-02 NOTE — ED Triage Notes (Signed)
Patient stated he is here because he needs a larger catheter in. Says a 67fr was placed in ED, but is too small and urine draining around line causing him to get "backed up"

## 2021-03-02 NOTE — Discharge Instructions (Signed)
See the Urologist as planned.

## 2021-03-04 NOTE — ED Provider Notes (Signed)
[place] DEPT Provider Note   CSN: [id number] Arrival date & time: 03/02/21  4128     History Chief Complaint  Patient presents with   catheter issue    TAURUS ALAMO is a 70 y.o. male.  HPI     70 year old male with history of A. fib, DVT, BPH with multiple TURPs comes in with chief complaint catheter issue.  Patient started having some urinary retention few days back.  He had come to the ER and we have placed in a 12 French Foley catheter.  Patient reports that since then he has had clogging of the catheter frequently and he is To come to the ER on 2 separate occasions because of that.  He thinks that a larger Foley catheter can be placed.  He has a urologist appointment on Tuesday.  In the interim, he woke up today and start urinating again from around the catheter, soiling his clothes.  He is requesting that we put in a 66 French Foley catheter so that he does not have to keep coming to the ER.  Past Medical History:  Diagnosis Date   Arthritis    Atrial fibrillation ([place])    Complication of anesthesia    needs to sit up  at 45 degree angle or gets afib during surgery or in recovery   Diabetes mellitus without complication ([place])    type 2   DVT (deep venous thrombosis) ([place]) 2015   left leg.behind knee   Foley catheter in place last changed 2eeks ago   last 7 months   GERD (gastroesophageal reflux disease)    Hx of radiation therapy 40 tx 2015   Hypercholesterolemia    Hypertension    Prostate cancer ([place]) 12/06/13   gleason 4+3=7, 6/12 cores positive. seed implant and radiation   Tourette's syndrome    Urinary retention    Wears glasses     Patient Active Problem List   Diagnosis Date Noted   Recurrent UTI 11/22/2019   Urinary retention 09/30/2019   Bilateral hydronephrosis 08/12/2019   Obstructive uropathy 08/11/2019   Acute lower UTI 08/11/2019   AKI (acute kidney injury) ([place]) 08/11/2019   Acute urinary retention  08/11/2019   Dysuria 08/10/2019   Constipation 08/10/2019   Low back pain 08/10/2019   Paroxysmal atrial fibrillation with RVR ([place]) 05/23/2019   Paroxysmal atrial fibrillation ([place]) 05/23/2019   Preventative health care 02/24/2019   Leg wound, right 02/24/2019   Essential hypertension 09/24/2018   Persistent atrial fibrillation    Microalbuminuria 04/23/2016   Rectal bleeding 07/18/2015   Dysphagia 07/18/2015   Chronic anticoagulation 03/14/2015   Morbid obesity ([place]) 03/14/2015   Exertional shortness of breath 01/12/2015   Abnormal ECG 01/12/2015   Encounter for screening colonoscopy 05/09/2014   Esophageal reflux 05/09/2014   Other dysphagia 05/09/2014   Evaluate for OSA (obstructive sleep apnea) 02/28/2014   Hyperlipidemia 02/28/2014   Prostate cancer ([place]) 12/14/2013   Erectile dysfunction 10/26/2013   Type II diabetes mellitus, uncontrolled ([place]) 10/19/2013   DVT (deep venous thrombosis) ([place])    Left leg DVT ([place]) 09/28/2013   Leg edema, left 09/28/2013    Past Surgical History:  Procedure Laterality Date   APPENDECTOMY  1968   BALLOON DILATION N/A 08/02/2015   Procedure: BALLOON DILATION;  Surgeon: [name], MD;  Location: [facility];  Service: Endoscopy;  Laterality: N/A;   CARDIOVERSION N/A 05/13/2018   Procedure: CARDIOVERSION;  Surgeon: Sanda Klein, MD;  Location: [facility];  Service: Cardiovascular;  Laterality: N/A;   CARDIOVERSION N/A 05/18/2018   Procedure: CARDIOVERSION;  Surgeon: Buford Dresser, MD;  Location: [facility];  Service: Cardiovascular;  Laterality: N/A;   COLONOSCOPY WITH PROPOFOL N/A 08/02/2015   Procedure: COLONOSCOPY WITH PROPOFOL w/ APC;  Surgeon: [name], MD;  Location: [facility];  Service: Endoscopy;  Laterality: N/A;   CYSTOSCOPY N/A 10/05/2020   Procedure: CYSTOSCOPY;  Surgeon: Alexis Frock, MD;  Location: [facility];  Service: Urology;  Laterality: N/A;   ESOPHAGOGASTRODUODENOSCOPY  (EGD) WITH PROPOFOL N/A 08/02/2015   Procedure: ESOPHAGOGASTRODUODENOSCOPY (EGD) WITH PROPOFOL/ possible dilation.;  Surgeon: [name], MD;  Location: [facility];  Service: Endoscopy;  Laterality: N/A;   KNEE SURGERY  2001   right knee orthoscopic   PROSTATE BIOPSY  12/06/13   Gleason 4+3=7, volume 35 gm   SPINE SURGERY  1963 and 1967   Tourette'ssyndrome spinal fluid removed    TONSILLECTOMY  as child   TRANSURETHRAL RESECTION OF PROSTATE N/A 09/30/2019   Procedure: TRANSURETHRAL RESECTION OF THE PROSTATE (TURP);  Surgeon: Alexis Frock, MD;  [facility];  Service: Urology;  Laterality: N/A;  1 HR   TRANSURETHRAL RESECTION OF PROSTATE N/A 10/05/2020   Procedure: TRANSURETHRAL RESECTION OF THE PROSTATE (TURP);  Surgeon: Alexis Frock, MD;  Location: [facility];  Service: Urology;  Laterality: N/A;  1 HR       Family History  Problem Relation Age of Onset   Heart disease Mother        before age 60   Diabetes Mother    Hyperlipidemia Mother    Varicose Veins Mother    Hypertension Mother    Heart disease Father    Diabetes Father    Hyperlipidemia Father    Hypertension Father    Cancer Brother    COPD Brother    Diabetes Brother    Hyperlipidemia Brother    Hypertension Brother     Social History   Tobacco Use   Smoking status: Former    Packs/day: 0.50    Years: 23.00    Pack years: 11.50    Types: Cigarettes    Quit date: 08/01/2012    Years since quitting: 8.5   Smokeless tobacco: Never  Vaping Use   Vaping Use: Never used  Substance Use Topics   Alcohol use: Yes    Alcohol/week: 0.0 standard drinks    Comment: very seldom, maybe 6x per year   Drug use: Never    Home Medications Prior to Admission medications   Medication Sig Start Date End Date Taking? Authorizing Provider  amLODipine (NORVASC) 2.5 MG tablet Take 2.5 mg by mouth daily. 11/12/20   [provider]  atorvastatin (LIPITOR) 80 MG tablet  Take 1 tablet (80 mg total) by mouth daily. 06/01/20   Elayne Snare, MD  blood glucose meter kit and supplies KIT Test blood sugar daily as directed. Dx code: E11.9 01/23/15   Darlyne Russian, MD  CALCIUM PO Take 1,000 tablets by mouth daily.     [provider]  cephALEXin (KEFLEX) 500 MG capsule Take 1 capsule (500 mg total) by mouth 4 (four) times daily for 7 days. 02/27/21 03/06/21  Muthersbaugh, Jarrett Soho, PA-C  CINNAMON PO Take 500 mg by mouth daily.     [provider]  Cyanocobalamin (VITAMIN B 12 PO) Take by mouth. 1000 mg daily    [provider]  docusate sodium (COLACE) 100 MG capsule Take 1 capsule (100  mg total) by mouth 2 (two) times daily. 10/06/20 10/06/21  Delight Hoh, MD  Empagliflozin-metFORMIN HCl ER (SYNJARDY XR) 12.12-998 MG TB24 Take 2 tablets daily 07/17/20   Elayne Snare, MD  ERLEADA 60 MG tablet Take 240 mg by mouth daily. 01/30/21   [provider]  furosemide (LASIX) 20 MG tablet Take 1 tablet by mouth daily for the next 5 days then only as needed for weight gain/swelling 10/15/20   Sherran Needs, NP  glimepiride (AMARYL) 1 MG tablet TAKE 1 TABLET (1 MG TOTAL) BY MOUTH DAILY WITH BREAKFAST. 10/30/20   Elayne Snare, MD  glucose blood (ACCU-CHEK GUIDE) test strip Use as instructed to check blood sugar twice daily. 10/29/18   Elayne Snare, MD  leuprolide (LUPRON) 11.25 MG injection Inject 11.25 mg into the muscle every 6 (six) months.     [provider]  losartan (COZAAR) 50 MG tablet 1 tablet in the morning and half tablet in the evening 01/03/21   Elayne Snare, MD  metoprolol tartrate (LOPRESSOR) 100 MG tablet TAKE 1 TABLET BY MOUTH TWICE A DAY 01/08/21   Sherran Needs, NP  OZEMPIC, 1 MG/DOSE, 4 MG/3ML SOPN INJECT 1 MG INTO THE SKIN ONCE A WEEK. FRIDAYS 02/01/21   Elayne Snare, MD  pantoprazole (PROTONIX) 40 MG tablet TAKE 1 TABLET BY MOUTH EVERY DAY 01/08/21   [name], MD  tamsulosin (FLOMAX) 0.4 MG CAPS capsule Take 1 capsule (0.4 mg  total) by mouth daily. Patient taking differently: Take 0.4 mg by mouth 2 (two) times daily. 08/14/19   Shawna Clamp, MD  traMADol (ULTRAM) 50 MG tablet Take 1 tablet (50 mg total) by mouth every 6 (six) hours as needed for moderate pain or severe pain. Post-operatively 10/05/20 10/05/21  Alexis Frock, MD  Vitamin D, Cholecalciferol, 400 UNITS TABS Take 400 Units by mouth daily.    [provider]  XARELTO 20 MG TABS tablet TAKE 1 TABLET (20 MG TOTAL) BY MOUTH DAILY BEFORE BREAKFAST. 07/02/20   [name], MD  zinc gluconate 50 MG tablet Take 50 mg by mouth daily.    [provider]    Allergies    Other  Review of Systems   Review of Systems  Constitutional:  Positive for activity change.  Genitourinary:  Positive for difficulty urinating.   Physical Exam Updated Vital Signs BP (!) 154/96 (BP Location: Left Arm)   Pulse 60   Temp 98.1 F (36.7 C) (Oral)   Resp 18   Ht '5\' 11"'  (1.803 m)   Wt 131.5 kg   SpO2 99%   BMI 40.45 kg/m   Physical Exam Vitals and nursing note reviewed.  Constitutional:      Appearance: He is well-developed.  HENT:     Head: Atraumatic.  Cardiovascular:     Rate and Rhythm: Normal rate.  Pulmonary:     Effort: Pulmonary effort is normal.  Genitourinary:    Comments: Indwelling Catheter in place   Musculoskeletal:     Cervical back: Neck supple.  Skin:    General: Skin is warm.  Neurological:     Mental Status: He is alert and oriented to person, place, and time.    ED Results / Procedures / Treatments   Labs (all labs ordered are listed, but only abnormal results are displayed) Labs Reviewed - No data to display  EKG None  Radiology No results found.  Procedures Procedures   Medications Ordered in ED Medications - No data to display  ED Course  I have reviewed the triage vital signs and the nursing notes.  Pertinent labs & imaging results that were available during my care of the patient were reviewed by  me and considered in my medical decision making (see chart for details).    MDM Rules/Calculators/A&P                          70 year old male comes in with chief complaint of clot urinary Foley catheter.  He had a fall [place] Foley catheter in place, and he has had multiple signs of obstruction of the catheter.  He has come to the ER 2 separate times for it and was discharged after the catheter was because.  He does not want to keep coming to the ER daily to get this fixed.  He is requesting that a 39 French catheter, which has been placed in the past without problems.  The risk and benefit change, with the exchange of Foley catheter without any problems.    Final Clinical Impression(s) / ED Diagnoses Final diagnoses:  Urinary catheter dysfunction, initial encounter [facility])    Rx / DC Orders ED Discharge Orders     None        Varney Biles, MD 03/04/21 1058

## 2021-03-05 ENCOUNTER — Telehealth: Payer: Self-pay | Admitting: Endocrinology

## 2021-03-05 DIAGNOSIS — E1165 Type 2 diabetes mellitus with hyperglycemia: Secondary | ICD-10-CM

## 2021-03-05 DIAGNOSIS — R338 Other retention of urine: Secondary | ICD-10-CM | POA: Diagnosis not present

## 2021-03-05 NOTE — Telephone Encounter (Signed)
1 yr ago pt had a problem with his Ozempic pens not working and states Dr.Kumar told him to let him know if it happens again.   Pt states he opened up a new pen today and once he clicked it to his dosage and then clicked it to inject, nothing came out. Pt states this has happened to him and his wife about 3-4 times now.

## 2021-03-06 MED ORDER — TRULICITY 3 MG/0.5ML ~~LOC~~ SOAJ: SUBCUTANEOUS | 3 refills | Status: DC

## 2021-03-06 NOTE — Addendum Note (Signed)
Addended by: Cinda Quest on: 03/06/2021 08:43 AM   Modules accepted: Orders

## 2021-03-07 ENCOUNTER — Telehealth: Payer: Self-pay | Admitting: Pharmacy Technician

## 2021-03-07 NOTE — Telephone Encounter (Addendum)
Patient Advocate Encounter   Received notification from [facility] that prior authorization for TRULICITY is required.   PA submitted on 03/07/2021 Key [id number] Status is APPROVED  03/07/2021 - 03/06/2024    [facility] will continue to follow.   Venida Jarvis. Nadara Mustard, CPhT Patient Advocate [facility] Phone: [phone] Fax:  [phone]

## 2021-03-13 ENCOUNTER — Other Ambulatory Visit (HOSPITAL_COMMUNITY): Payer: Self-pay | Admitting: Urology

## 2021-03-13 ENCOUNTER — Other Ambulatory Visit: Payer: Self-pay | Admitting: Urology

## 2021-03-13 DIAGNOSIS — C61 Malignant neoplasm of prostate: Secondary | ICD-10-CM

## 2021-03-20 ENCOUNTER — Encounter (HOSPITAL_COMMUNITY): Payer: Self-pay | Admitting: Nurse Practitioner

## 2021-03-20 ENCOUNTER — Ambulatory Visit (HOSPITAL_COMMUNITY)
Admission: RE | Admit: 2021-03-20 | Discharge: 2021-03-20 | Disposition: A | Discharge status: Home / Self Care | Payer: BC Managed Care – PPO | Source: Ambulatory Visit | Attending: Nurse Practitioner | Admitting: Nurse Practitioner

## 2021-03-20 ENCOUNTER — Other Ambulatory Visit: Payer: Self-pay

## 2021-03-20 VITALS — BP 134/84 | HR 106 | Ht 71.0 in | Wt 284.2 lb

## 2021-03-20 DIAGNOSIS — I10 Essential (primary) hypertension: Secondary | ICD-10-CM | POA: Insufficient documentation

## 2021-03-20 DIAGNOSIS — I4821 Permanent atrial fibrillation: Secondary | ICD-10-CM | POA: Insufficient documentation

## 2021-03-20 DIAGNOSIS — Z8249 Family history of ischemic heart disease and other diseases of the circulatory system: Secondary | ICD-10-CM | POA: Insufficient documentation

## 2021-03-20 DIAGNOSIS — C801 Malignant (primary) neoplasm, unspecified: Secondary | ICD-10-CM | POA: Insufficient documentation

## 2021-03-20 DIAGNOSIS — Z7901 Long term (current) use of anticoagulants: Secondary | ICD-10-CM | POA: Insufficient documentation

## 2021-03-20 DIAGNOSIS — Z87891 Personal history of nicotine dependence: Secondary | ICD-10-CM | POA: Insufficient documentation

## 2021-03-20 DIAGNOSIS — D6869 Other thrombophilia: Secondary | ICD-10-CM

## 2021-03-20 DIAGNOSIS — Z79899 Other long term (current) drug therapy: Secondary | ICD-10-CM | POA: Diagnosis not present

## 2021-03-20 NOTE — Progress Notes (Signed)
Date:  03/20/2021   ID:  [name], DOB 1951-05-18, MRN [id number]  Location: home   Provider location: [address] [place], [place] [address] Evaluation Performed:Follow up   PCP:  [name], MD  Primary Cardiologist:  None  Primary Electrophysiologist: none   IO:XBDZHG fibrillation   History of Present Illness: Tommy Yang is a 70 y.o. male who presents for f/u today. He has longstanding persistent afib. He feels he may have had afib for 2 years befor he was diagnosed.  On previous visits, he has been offered AAD therapy which he has deferred as he feels he is minimally symptomatic. He does have some snoring. Possible apnea, but  pt states he would not be able to deal with a mask to treat sleep apnea. He scratched  his lower leg and being diabetic, has an appointment with the wound center to make sure it is healing.  F/u in afib clinic, 03/20/21. He is here to f/u with chronic afib. He states that he has had  bone cancer reoccur and continues to struggle with prostate CA and hematuria. Has been in the ER recently with issues with catheter getting blocked with blood clots. His urologist has discussed use of a drug Erleada, for prostate CA,  but pt is concerned as it would make xarelto less effective. I discussed with PharmD, and his best option for anticoagulation with this drug would be warfarin. This may have some interaction but she said that this could be overcome with frequent monitoring. He is rate controlled. He has lost some weight as he does not have as much appetite.   Today, he denies symptoms of palpitations, chest pain, shortness of breath, orthopnea, PND, lower extremity edema, claudication, dizziness, presyncope, syncope, bleeding, or neurologic sequela. The patient is tolerating medications without difficulties and is otherwise without complaint today.   he denies symptoms of cough, fevers, chills, or new SOB worrisome for COVID 19.    There were no  vitals filed for this visit.   Past Medical History:  Diagnosis Date   Arthritis    Atrial fibrillation ([place])    Complication of anesthesia    needs to sit up  at 45 degree angle or gets afib during surgery or in recovery   Diabetes mellitus without complication ([place])    type 2   DVT (deep venous thrombosis) ([place]) 2015   left leg.behind knee   Foley catheter in place last changed 2eeks ago   last 7 months   GERD (gastroesophageal reflux disease)    Hx of radiation therapy 40 tx 2015   Hypercholesterolemia    Hypertension    Prostate cancer ([place]) 12/06/13   gleason 4+3=7, 6/12 cores positive. seed implant and radiation   Tourette's syndrome    Urinary retention    Wears glasses    Past Surgical History:  Procedure Laterality Date   APPENDECTOMY  1968   BALLOON DILATION N/A 08/02/2015   Procedure: BALLOON DILATION;  Surgeon: [name], MD;  Location: [facility];  Service: Endoscopy;  Laterality: N/A;   CARDIOVERSION N/A 05/13/2018   Procedure: CARDIOVERSION;  Surgeon: Sanda Klein, MD;  Location: [facility];  Service: Cardiovascular;  Laterality: N/A;   CARDIOVERSION N/A 05/18/2018   Procedure: CARDIOVERSION;  Surgeon: Buford Dresser, MD;  Location: [facility];  Service: Cardiovascular;  Laterality: N/A;   COLONOSCOPY WITH PROPOFOL N/A 08/02/2015   Procedure: COLONOSCOPY WITH PROPOFOL w/ APC;  Surgeon: [name], MD;  Location: [facility];  Service: Endoscopy;  Laterality: N/A;   CYSTOSCOPY N/A 10/05/2020   Procedure: CYSTOSCOPY;  Surgeon: Alexis Frock, MD;  Location: [facility];  Service: Urology;  Laterality: N/A;   ESOPHAGOGASTRODUODENOSCOPY (EGD) WITH PROPOFOL N/A 08/02/2015   Procedure: ESOPHAGOGASTRODUODENOSCOPY (EGD) WITH PROPOFOL/ possible dilation.;  Surgeon: [name], MD;  Location: [facility];  Service: Endoscopy;  Laterality: N/A;   KNEE SURGERY  2001   right knee orthoscopic   PROSTATE BIOPSY  12/06/13   Gleason  4+3=7, volume 35 gm   SPINE SURGERY  1963 and 1967   Tourette'ssyndrome spinal fluid removed    TONSILLECTOMY  as child   TRANSURETHRAL RESECTION OF PROSTATE N/A 09/30/2019   Procedure: TRANSURETHRAL RESECTION OF THE PROSTATE (TURP);  Surgeon: Alexis Frock, MD;  Location: [facility];  Service: Urology;  Laterality: N/A;  1 HR   TRANSURETHRAL RESECTION OF PROSTATE N/A 10/05/2020   Procedure: TRANSURETHRAL RESECTION OF THE PROSTATE (TURP);  Surgeon: Alexis Frock, MD;  [facility];  Service: Urology;  Laterality: N/A;  1 HR     Current Outpatient Medications  Medication Sig Dispense Refill   amLODipine (NORVASC) 2.5 MG tablet Take 2.5 mg by mouth daily.     atorvastatin (LIPITOR) 80 MG tablet Take 1 tablet (80 mg total) by mouth daily. 90 tablet 3   blood glucose meter kit and supplies KIT Test blood sugar daily as directed. Dx code: E11.9 1 each 0   CALCIUM PO Take 1,000 tablets by mouth daily.      CINNAMON PO Take 500 mg by mouth daily.      Cyanocobalamin (VITAMIN B 12 PO) Take by mouth. 1000 mg daily     docusate sodium (COLACE) 100 MG capsule Take 1 capsule (100 mg total) by mouth 2 (two) times daily. 60 capsule 11   Dulaglutide (TRULICITY) 3 BS/4.9QP SOPN Please inject 36m weekly 4 mL 3   Empagliflozin-metFORMIN HCl ER (SYNJARDY XR) 12.12-998 MG TB24 Take 2 tablets daily 180 tablet 3   ERLEADA 60 MG tablet Take 240 mg by mouth daily.     furosemide (LASIX) 20 MG tablet Take 1 tablet by mouth daily for the next 5 days then only as needed for weight gain/swelling 10 tablet 1   glimepiride (AMARYL) 1 MG tablet TAKE 1 TABLET (1 MG TOTAL) BY MOUTH DAILY WITH BREAKFAST. 90 tablet 1   glucose blood (ACCU-CHEK GUIDE) test strip Use as instructed to check blood sugar twice daily. 100 each 3   leuprolide (LUPRON) 11.25 MG injection Inject 11.25 mg into the muscle every 6 (six) months.      losartan (COZAAR) 50 MG tablet 1 tablet in the morning and half  tablet in the evening 135 tablet 1   metoprolol tartrate (LOPRESSOR) 100 MG tablet TAKE 1 TABLET BY MOUTH TWICE A DAY 180 tablet 2   pantoprazole (PROTONIX) 40 MG tablet TAKE 1 TABLET BY MOUTH EVERY DAY 90 tablet 3   tamsulosin (FLOMAX) 0.4 MG CAPS capsule Take 1 capsule (0.4 mg total) by mouth daily. (Patient taking differently: Take 0.4 mg by mouth 2 (two) times daily.) 30 capsule 30   traMADol (ULTRAM) 50 MG tablet Take 1 tablet (50 mg total) by mouth every 6 (six) hours as needed for moderate pain or severe pain. Post-operatively 15 tablet 0   Vitamin D, Cholecalciferol, 400 UNITS TABS Take 400 Units by mouth daily.     XARELTO 20 MG TABS tablet TAKE 1 TABLET (  20 MG TOTAL) BY MOUTH DAILY BEFORE BREAKFAST. 90 tablet 3   zinc gluconate 50 MG tablet Take 50 mg by mouth daily.     No current facility-administered medications for this encounter.    Allergies:   Other   Social History:  The patient  reports that he quit smoking about 8 years ago. His smoking use included cigarettes. He has a 11.50 pack-year smoking history. He has never used smokeless tobacco. He reports current alcohol use. He reports that he does not use drugs.   Family History:  The patient's  family history includes COPD in his brother; Cancer in his brother; Diabetes in his brother, father, and mother; Heart disease in his father and mother; Hyperlipidemia in his brother, father, and mother; Hypertension in his brother, father, and mother; Varicose Veins in his mother.    ROS:  Please see the history of present illness.   All other systems are personally reviewed and negative.   Exam: General:  No distress Eyes: EOMI, anicteric ENT: Oral Mucosa clear and moist Cardiovascular: irregular rhythm, no murmurs, rubs or gallops, no edema, Respiratory: Normal respiratory effort on room air, lungs clear to auscultation bilaterally Abdomen: soft, non-distended, non-tender, normal bowel sounds. Wearing a urinary catheter with blood  tinged urine. Skin: No Rash Neurologic: Grossly no focal neuro deficit.Mental status AAOx3, speech normal, Psychiatric:Appropriate affect, and mood   Recent Labs: 05/18/2020: Platelets 243; TSH 1.12 08/03/2020: ALT 9 10/06/2020: Hemoglobin 13.6 12/31/2020: BUN 19; Creatinine, Ser 1.07; Potassium 4.4; Sodium 141  personally reviewed    Other studies personally reviewed: Epic records reviewed  EKG- afib at 106 bpm, qrs int 74 ms, qrs int 422 ms, qtc 422 ms  Echo- 1. The left ventricle has normal systolic function, with an ejection  fraction of 55-60%. The cavity size was normal. Left ventricular diastolic  Doppler parameters are indeterminate.   2. The right ventricle has normal systolic function. The cavity was  normal. There is no increase in right ventricular wall thickness.   3. Left atrial size was mildly dilated.   4. The mitral valve is grossly normal. Mild thickening of the mitral  valve leaflet.   5. The tricuspid valve is grossly normal.   6. Mild thickening of the aortic valve. Sclerosis without any evidence of  stenosis of the aortic valve.   7. The aorta is normal unless otherwise noted.   ASSESSMENT AND PLAN:  1. Permanent  atrial fibrillation  Minimally symptomatic  He is well rate controlled  Continue current rate control without change  Continue xarelto 20 mg daily  This patients CHA2DS2-VASc Score and unadjusted Ischemic Stroke Rate (% per year) is equal to 7.2 % stroke rate/year from a score of 5  2. HTN Stable  3. Prostate CA If pt chooses to go on Erleade, he will have to change his antiarrythmic to warfarin as his best option, as the other DOAC's will be less effective with this drug on board, per Fuller Canada, MD  Pt will discuss this with his urologist on upcoming appointment  He would prefer to go to [place] for mangement if this should be the case   Follow-up:  6 months  Current medicines are reviewed at length with the patient  today.   The patient does not have concerns regarding his medicines.  The following changes were made today:  none  Labs/ tests ordered today include:none Orders Placed This Encounter  Procedures   EKG 12-Lead    Patient  Risk:  after full review of this patients clinical status, I feel that they are at moderate risk at this time.  Signed, Roderic Palau, NP 03/20/2021 8:43 AM  [facility] [address] [place], [place] [address] [phone]

## 2021-03-28 ENCOUNTER — Telehealth: Payer: Self-pay | Admitting: Endocrinology

## 2021-03-28 NOTE — Telephone Encounter (Signed)
Pt called because his left foot is swelling and he is wondering if the Dr can send something in for that? Pt states it is his left foot and it is the side that has a blood clot behind the knee but he never usually has swelling. If Dr can send something in please send to:  [id number] [place]:  [phone] Fax:  [phone]

## 2021-03-29 ENCOUNTER — Telehealth: Payer: Self-pay | Admitting: Internal Medicine

## 2021-03-29 NOTE — Telephone Encounter (Signed)
   Seeking advice for leg swelling Declined appointment  Please call

## 2021-04-01 NOTE — Telephone Encounter (Signed)
Pt last seen by me sept 2021; swelling has numerous causes and treatment  Needs ROV please

## 2021-04-02 NOTE — Telephone Encounter (Signed)
Ok to come when he can, but if gets worse or even worsening sob, he should go to [facility] or ED

## 2021-04-02 NOTE — Telephone Encounter (Signed)
Patient states he is unable to come in this week due to having appointments everyday.

## 2021-04-04 DIAGNOSIS — C61 Malignant neoplasm of prostate: Secondary | ICD-10-CM | POA: Diagnosis not present

## 2021-04-08 ENCOUNTER — Ambulatory Visit (HOSPITAL_COMMUNITY)
Admission: RE | Admit: 2021-04-08 | Discharge: 2021-04-08 | Disposition: A | Discharge status: Home / Self Care | Payer: BC Managed Care – PPO | Source: Ambulatory Visit | Attending: Urology | Admitting: Urology

## 2021-04-08 ENCOUNTER — Other Ambulatory Visit: Payer: Self-pay

## 2021-04-08 DIAGNOSIS — N1330 Unspecified hydronephrosis: Secondary | ICD-10-CM | POA: Diagnosis not present

## 2021-04-08 DIAGNOSIS — N3289 Other specified disorders of bladder: Secondary | ICD-10-CM | POA: Diagnosis not present

## 2021-04-08 DIAGNOSIS — C61 Malignant neoplasm of prostate: Secondary | ICD-10-CM | POA: Diagnosis not present

## 2021-04-08 DIAGNOSIS — C7951 Secondary malignant neoplasm of bone: Secondary | ICD-10-CM | POA: Diagnosis not present

## 2021-04-08 DIAGNOSIS — K6289 Other specified diseases of anus and rectum: Secondary | ICD-10-CM | POA: Diagnosis not present

## 2021-04-08 DIAGNOSIS — M545 Low back pain, unspecified: Secondary | ICD-10-CM | POA: Diagnosis not present

## 2021-04-08 DIAGNOSIS — N201 Calculus of ureter: Secondary | ICD-10-CM | POA: Diagnosis not present

## 2021-04-08 MED ORDER — TECHNETIUM TC 99M MEDRONATE IV KIT
19.5000 | PACK | Freq: Once | INTRAVENOUS | Status: AC
  Administered 2021-04-08: 19.5 via INTRAVENOUS

## 2021-04-11 DIAGNOSIS — C7951 Secondary malignant neoplasm of bone: Secondary | ICD-10-CM | POA: Diagnosis not present

## 2021-04-11 DIAGNOSIS — C61 Malignant neoplasm of prostate: Secondary | ICD-10-CM | POA: Diagnosis not present

## 2021-04-11 DIAGNOSIS — C778 Secondary and unspecified malignant neoplasm of lymph nodes of multiple regions: Secondary | ICD-10-CM | POA: Diagnosis not present

## 2021-04-11 DIAGNOSIS — R338 Other retention of urine: Secondary | ICD-10-CM | POA: Diagnosis not present

## 2021-04-15 ENCOUNTER — Other Ambulatory Visit (INDEPENDENT_AMBULATORY_CARE_PROVIDER_SITE_OTHER): Payer: BC Managed Care – PPO

## 2021-04-15 ENCOUNTER — Other Ambulatory Visit: Payer: Self-pay

## 2021-04-15 DIAGNOSIS — E1165 Type 2 diabetes mellitus with hyperglycemia: Secondary | ICD-10-CM | POA: Diagnosis not present

## 2021-04-15 LAB — BASIC METABOLIC PANEL
BUN: 24 mg/dL — ABNORMAL HIGH (ref 6–23)
CO2: 25 mEq/L (ref 19–32)
Calcium: 8.9 mg/dL (ref 8.4–10.5)
Chloride: 106 mEq/L (ref 96–112)
Creatinine, Ser: 1.38 mg/dL (ref 0.40–1.50)
GFR: 52.53 mL/min — ABNORMAL LOW (ref >60.00)
Glucose, Bld: 79 mg/dL (ref 70–99)
Potassium: 4.6 mEq/L (ref 3.5–5.1)
Sodium: 141 mEq/L (ref 135–145)

## 2021-04-15 LAB — HEMOGLOBIN A1C: Hgb A1c MFr Bld: 7.7 % — ABNORMAL HIGH (ref 4.6–6.5)

## 2021-04-16 ENCOUNTER — Other Ambulatory Visit: Payer: Self-pay | Admitting: Internal Medicine

## 2021-04-16 ENCOUNTER — Other Ambulatory Visit: Payer: Self-pay | Admitting: Endocrinology

## 2021-04-16 DIAGNOSIS — E785 Hyperlipidemia, unspecified: Secondary | ICD-10-CM

## 2021-04-17 NOTE — Progress Notes (Signed)
Patient ID: Tommy Yang, male   DOB: 07-26-1951, 69 y.o.   MRN: [id number]           Reason for Appointment: Follow-up     History of Present Illness:          Diagnosis: Type 2 diabetes mellitus, date of diagnosis: 05/2013       Past history:   He apparently had significant hyperglycemia in 2014 when seen in the urgent care center but did not establish with a PCP for control. He was started on treatment for his diabetes in 10/2013 and he was initially treated with metformin and Januvia A few months later he was also given glipizide ER to help his control when his A1c had gone up to 10.9 He says he was also seen by dietitian but no record available of this. Subsequently his blood sugar control had improved with A1c down to 6.8 in 9/15 On his consultation in 3/16 he had high blood sugars and weight gain; A1c was 9.1% with taking glipizide ER, Januvia and metformin.  Recent history:    Non-insulin hypoglycemic drugs the patient is taking are: Amaryl 1 mg at dinner,  Synjardy XR 12.12/998 2 tablets daily  His A1c is higher at 7.7 compared to 7.1   Current blood sugar patterns and problems identified: He has lost about 30 pounds since his last visit from progression of his prostate cancer and likely decreased appetite He lost his meter a couple of weeks ago and not clear when his blood sugars are high He thinks that because of stress and pain in the will sometimes have blood sugars around 200 but not consistently Fasting readings appear to be relatively better and as low as 80 at home and was 79 in the lab He has been out of his Trulicity in the last 3 weeks since this is on backorder Previously was switched to this because of having difficulty with the injection device with the Ozempic Continues to have weakness and not able to do any physical activity with   Side effects from medications have been: None  Compliance with the medical regimen: Fair  Hypoglycemia: None     Glucose monitoring:  done 1 times a day         Glucometer:  [place]  Blood Glucose readings by recall  FASTING = 180-115 After meals = 130-160 usually Average last month 127    Previous AVERAGE for 30 days 122       Self-care: The diet that the patient has been following is: tries to limit portions and fats .   Dietician visit, most recent:?  2015 .               Weight history:    Wt Readings from Last 3 Encounters:  04/18/21 273 lb 3.2 oz (123.9 kg)  03/20/21 284 lb 3.2 oz (128.9 kg)  03/02/21 290 lb (131.5 kg)    Glycemic control:   Lab Results  Component Value Date   HGBA1C 7.7 (H) 04/15/2021   HGBA1C 7.1 (H) 12/31/2020   HGBA1C 7.1 (H) 08/03/2020   Lab Results  Component Value Date   MICROALBUR 39.7 (H) 12/31/2020   LDLCALC 91 08/03/2020   CREATININE 1.38 04/15/2021   Lab Results  Component Value Date   HGB 13.6 10/06/2020     Lab on 04/15/2021  Component Date Value Ref Range Status   Sodium 04/15/2021 141  135 - 145 mEq/L Final   Potassium 04/15/2021 4.6  3.5 -  5.1 mEq/L Final   Chloride 04/15/2021 106  96 - 112 mEq/L Final   CO2 04/15/2021 25  19 - 32 mEq/L Final   Glucose, Bld 04/15/2021 79  70 - 99 mg/dL Final   BUN 04/15/2021 24 (A) 6 - 23 mg/dL Final   Creatinine, Ser 04/15/2021 1.38  0.40 - 1.50 mg/dL Final   GFR 04/15/2021 52.53 (A) >60.00 mL/min Final   Calculated using the CKD-EPI Creatinine Equation (2021)   Calcium 04/15/2021 8.9  8.4 - 10.5 mg/dL Final   Hgb A1c MFr Bld 04/15/2021 7.7 (A) 4.6 - 6.5 % Final   Glycemic Control Guidelines for People with Diabetes:Non Diabetic:  <6%Goal of Therapy: <7%Additional Action Suggested:  >8%       Allergies as of 04/18/2021       Reactions   Other    shingls shot caused atrial fib did not take 2nd shot        Medication List        Accurate as of April 18, 2021  9:24 AM. If you have any questions, ask your nurse or doctor.          STOP taking these medications     amLODipine 2.5 MG tablet Commonly known as: NORVASC Stopped by: Elayne Snare, MD   Trulicity 3 JQ/7.3AL Sopn Generic drug: Dulaglutide Stopped by: Elayne Snare, MD       TAKE these medications    atorvastatin 80 MG tablet Commonly known as: LIPITOR Take 1 tablet (80 mg total) by mouth daily.   atorvastatin 40 MG tablet Commonly known as: LIPITOR TAKE 1 TABLET BY MOUTH EVERY DAY   blood glucose meter kit and supplies Kit Test blood sugar daily as directed. Dx code: E11.9   CALCIUM PO Take 1,000 tablets by mouth daily.   CINNAMON PO Take 500 mg by mouth daily.   docusate sodium 100 MG capsule Commonly known as: Colace Take 1 capsule (100 mg total) by mouth 2 (two) times daily.   Erleada 60 MG tablet Generic drug: apalutamide Take 240 mg by mouth daily.   furosemide 20 MG tablet Commonly known as: LASIX Take 1 tablet by mouth daily for the next 5 days then only as needed for weight gain/swelling   glimepiride 1 MG tablet Commonly known as: AMARYL TAKE 1 TABLET BY MOUTH DAILY WITH BREAKFAST.   glucose blood test strip Commonly known as: Accu-Chek Guide Use as instructed to check blood sugar twice daily.   leuprolide 11.25 MG injection Commonly known as: LUPRON Inject 11.25 mg into the muscle every 6 (six) months.   losartan 50 MG tablet Commonly known as: COZAAR 1 tablet in the morning and half tablet in the evening   metoprolol tartrate 100 MG tablet Commonly known as: LOPRESSOR TAKE 1 TABLET BY MOUTH TWICE A DAY   pantoprazole 40 MG tablet Commonly known as: PROTONIX TAKE 1 TABLET BY MOUTH EVERY DAY   Synjardy XR 12.12-998 MG Tb24 Generic drug: Empagliflozin-metFORMIN HCl ER Take 2 tablets daily   tamsulosin 0.4 MG Caps capsule Commonly known as: FLOMAX Take 1 capsule (0.4 mg total) by mouth daily. What changed: when to take this   traMADol 50 MG tablet Commonly known as: Ultram Take 1 tablet (50 mg total) by mouth every 6 (six) hours as  needed for moderate pain or severe pain. Post-operatively   VITAMIN B 12 PO Take by mouth. 1000 mg daily   Vitamin D (Cholecalciferol) 10 MCG (400 UNIT) Tabs Take 400 Units by mouth  daily.   Xarelto 20 MG Tabs tablet Generic drug: rivaroxaban TAKE 1 TABLET (20 MG TOTAL) BY MOUTH DAILY BEFORE BREAKFAST.   zinc gluconate 50 MG tablet Take 50 mg by mouth daily.        Allergies:  Allergies  Allergen Reactions   Other     shingls shot caused atrial fib did not take 2nd shot    Past Medical History:  Diagnosis Date   Arthritis    Atrial fibrillation (HCC)    Complication of anesthesia    needs to sit up  at 45 degree angle or gets afib during surgery or in recovery   Diabetes mellitus without complication ([place])    type 2   DVT (deep venous thrombosis) ([place]) 2015   left leg.behind knee   Foley catheter in place last changed 2eeks ago   last 7 months   GERD (gastroesophageal reflux disease)    Hx of radiation therapy 40 tx 2015   Hypercholesterolemia    Hypertension    Prostate cancer ([place]) 12/06/13   gleason 4+3=7, 6/12 cores positive. seed implant and radiation   Tourette's syndrome    Urinary retention    Wears glasses     Past Surgical History:  Procedure Laterality Date   APPENDECTOMY  1968   BALLOON DILATION N/A 08/02/2015   Procedure: BALLOON DILATION;  Surgeon: [name], MD;  Location: [facility];  Service: Endoscopy;  Laterality: N/A;   CARDIOVERSION N/A 05/13/2018   Procedure: CARDIOVERSION;  Surgeon: Sanda Klein, MD;  Location: [facility];  Service: Cardiovascular;  Laterality: N/A;   CARDIOVERSION N/A 05/18/2018   Procedure: CARDIOVERSION;  Surgeon: Buford Dresser, MD;  Location: [facility];  Service: Cardiovascular;  Laterality: N/A;   COLONOSCOPY WITH PROPOFOL N/A 08/02/2015   Procedure: COLONOSCOPY WITH PROPOFOL w/ APC;  Surgeon: [name], MD;  Location: [facility];  Service: Endoscopy;  Laterality: N/A;   CYSTOSCOPY N/A  10/05/2020   Procedure: CYSTOSCOPY;  Surgeon: Alexis Frock, MD;  Location: [facility];  Service: Urology;  Laterality: N/A;   ESOPHAGOGASTRODUODENOSCOPY (EGD) WITH PROPOFOL N/A 08/02/2015   Procedure: ESOPHAGOGASTRODUODENOSCOPY (EGD) WITH PROPOFOL/ possible dilation.;  Surgeon: [name], MD;  Location: [facility];  Service: Endoscopy;  Laterality: N/A;   KNEE SURGERY  2001   right knee orthoscopic   PROSTATE BIOPSY  12/06/13   Gleason 4+3=7, volume 35 gm   SPINE SURGERY  1963 and 1967   Tourette'ssyndrome spinal fluid removed    TONSILLECTOMY  as child   TRANSURETHRAL RESECTION OF PROSTATE N/A 09/30/2019   Procedure: TRANSURETHRAL RESECTION OF THE PROSTATE (TURP);  Surgeon: Alexis Frock, MD;  Location[facility];  Service: Urology;  Laterality: N/A;  1 HR   TRANSURETHRAL RESECTION OF PROSTATE N/A 10/05/2020   Procedure: TRANSURETHRAL RESECTION OF THE PROSTATE (TURP);  Surgeon: Alexis Frock, MD;  Location: [facility];  Service: Urology;  Laterality: N/A;  1 HR    Family History  Problem Relation Age of Onset   Heart disease Mother        before age 87   Diabetes Mother    Hyperlipidemia Mother    Varicose Veins Mother    Hypertension Mother    Heart disease Father    Diabetes Father    Hyperlipidemia Father    Hypertension Father    Cancer Brother    COPD Brother    Diabetes Brother    Hyperlipidemia Brother    Hypertension Brother  Social History:  reports that he quit smoking about 8 years ago. His smoking use included cigarettes. He has a 11.50 pack-year smoking history. He has never used smokeless tobacco. He reports current alcohol use. He reports that he does not use drugs.    Review of Systems        HYPERLIPIDEMIA: he has had significant hyperlipidemia and has been treated with Lipitor 80 mg daily Previously on Vascepa for high triglycerides but he felt that it would cause atrial fibrillation an hour after  taking the medicine in the evening and he stopped this Lipitor increased on the last visit with improvement in LDL  His diet is usually low in fat  Triglycerides are still normal despite stopping Vascepa HDL usually low Has no history of CAD       Lab Results  Component Value Date   CHOL 155 08/03/2020   CHOL 170 05/18/2020   CHOL 144 11/18/2019   Lab Results  Component Value Date   HDL 36.60 (L) 08/03/2020   HDL 37 (L) 05/18/2020   HDL 37.30 (L) 11/18/2019   Lab Results  Component Value Date   LDLCALC 91 08/03/2020   LDLCALC 107 (H) 05/18/2020   LDLCALC 83 11/18/2019   Lab Results  Component Value Date   TRIG 137.0 08/03/2020   TRIG 144 05/18/2020   TRIG 117.0 11/18/2019   Lab Results  Component Value Date   CHOLHDL 4 08/03/2020   CHOLHDL 4.6 05/18/2020   CHOLHDL 4 11/18/2019   Lab Results  Component Value Date   LDLDIRECT 93.0 12/30/2016   LDLDIRECT 103.0 02/08/2015                HYPERTENSION: He has been treated with losartan 75 mg and low-dose metoprolol, followed by PCP and cardiologist Appears to have lower blood pressure reading today compared to previous values  BP Readings from Last 3 Encounters:  04/18/21 122/76  03/20/21 134/84  03/02/21 (!) 154/96    RENAL function history as follows:  Lab Results  Component Value Date   CREATININE 1.38 04/15/2021   CREATININE 1.07 12/31/2020   CREATININE 1.00 10/06/2020    Followed by cardiologist for atrial fibrillation  Taking B12 for low normal vitamin B12 level  LABS:  Lab on 04/15/2021  Component Date Value Ref Range Status   Sodium 04/15/2021 141  135 - 145 mEq/L Final   Potassium 04/15/2021 4.6  3.5 - 5.1 mEq/L Final   Chloride 04/15/2021 106  96 - 112 mEq/L Final   CO2 04/15/2021 25  19 - 32 mEq/L Final   Glucose, Bld 04/15/2021 79  70 - 99 mg/dL Final   BUN 04/15/2021 24 (A) 6 - 23 mg/dL Final   Creatinine, Ser 04/15/2021 1.38  0.40 - 1.50 mg/dL Final   GFR 04/15/2021 52.53 (A)  >60.00 mL/min Final   Calculated using the CKD-EPI Creatinine Equation (2021)   Calcium 04/15/2021 8.9  8.4 - 10.5 mg/dL Final   Hgb A1c MFr Bld 04/15/2021 7.7 (A) 4.6 - 6.5 % Final   Glycemic Control Guidelines for People with Diabetes:Non Diabetic:  <6%Goal of Therapy: <7%Additional Action Suggested:  >8%     Physical Examination:  BP 122/76   Pulse 75   Ht '5\' 11"'  (1.803 m)   Wt 273 lb 3.2 oz (123.9 kg)   SpO2 97%   BMI 38.10 kg/m     Diabetes type 2, obese, non-insulin-dependent   See history of present illness for detailed discussion of his current management,  blood sugar patterns and problems identified   His A1c is higher at 7.7  Currently on Synjardy XR and low-dose Amaryl  His A1c has gone up partly from being off Trulicity which she cannot get for the last 3 weeks as well as occasional high readings related to pain and stress Not able to review home meter He has lost weight from his malignancy  Fasting glucose appears to be fairly good  HYPERTENSION: Blood pressure is low normal  History of microalbuminuria: Although if this is slightly better it is still persistent Will recheck on the next visit  Renal dysfunction: This may be from relatively lower blood pressure reading now    PLAN:  He will leave off to GLP-1 drugs since a is losing weight and has decreased appetite as well as blood sugars are consistently high Considering his having a diagnosis of metastatic tumor will not need to aggressively treat him at the present He will try to get a new blood sugar meter from his insurance and let us know if he wants to switch to a branded meter Discussed checking blood sugars at various times and to let us know if they are consistently higher after meals Discussed blood sugar targets We will try to have balanced meals with some protein at each meal May consider Rybelsus if Ozempic and Trulicity are not available at the pharmacy  Decreased dose of losartan to 50  mg for now  He will check blood pressure regularly at home and let us know if it is starting to go up   Recheck microalbumin and renal function on the next visit   Patient Instructions  Take only 62m Losartan  Check blood sugars on waking up 3 days a week  Also check blood sugars about 2 hours after meals and do this after different meals by rotation  Recommended blood sugar levels on waking up are 90-130 and about 2 hours after meal is 130-160  Please bring your blood sugar monitor to each visit, thank you   Total visit time including counseling = 30 minutes   AElayne Snare8/18/2022, 9:24 AM   Note: This office note was prepared with Dragon voice recognition system technology. Any transcriptional errors that result from this process are unintentional.

## 2021-04-18 ENCOUNTER — Other Ambulatory Visit: Payer: Self-pay

## 2021-04-18 ENCOUNTER — Encounter: Payer: Self-pay | Admitting: Endocrinology

## 2021-04-18 ENCOUNTER — Ambulatory Visit: Payer: BC Managed Care – PPO | Admitting: Endocrinology

## 2021-04-18 VITALS — BP 122/76 | HR 75 | Ht 71.0 in | Wt 273.2 lb

## 2021-04-18 DIAGNOSIS — E1129 Type 2 diabetes mellitus with other diabetic kidney complication: Secondary | ICD-10-CM | POA: Diagnosis not present

## 2021-04-18 DIAGNOSIS — E1165 Type 2 diabetes mellitus with hyperglycemia: Secondary | ICD-10-CM | POA: Diagnosis not present

## 2021-04-18 DIAGNOSIS — E782 Mixed hyperlipidemia: Secondary | ICD-10-CM

## 2021-04-18 DIAGNOSIS — I10 Essential (primary) hypertension: Secondary | ICD-10-CM

## 2021-04-18 DIAGNOSIS — R809 Proteinuria, unspecified: Secondary | ICD-10-CM

## 2021-04-18 NOTE — Patient Instructions (Addendum)
Take only '50mg'$  Losartan  Check blood sugars on waking up 3 days a week  Also check blood sugars about 2 hours after meals and do this after different meals by rotation  Recommended blood sugar levels on waking up are 90-130 and about 2 hours after meal is 130-160  Please bring your blood sugar monitor to each visit, thank you

## 2021-04-26 ENCOUNTER — Other Ambulatory Visit (HOSPITAL_COMMUNITY): Payer: Self-pay | Admitting: *Deleted

## 2021-04-26 DIAGNOSIS — C7951 Secondary malignant neoplasm of bone: Secondary | ICD-10-CM | POA: Diagnosis not present

## 2021-04-26 DIAGNOSIS — C61 Malignant neoplasm of prostate: Secondary | ICD-10-CM | POA: Diagnosis not present

## 2021-04-26 MED ORDER — FUROSEMIDE 20 MG PO TABS: ORAL_TABLET | ORAL | 1 refills | Status: DC

## 2021-04-29 ENCOUNTER — Other Ambulatory Visit: Payer: Self-pay | Admitting: Endocrinology

## 2021-04-30 ENCOUNTER — Encounter: Payer: Self-pay | Admitting: Radiation Oncology

## 2021-04-30 ENCOUNTER — Other Ambulatory Visit: Payer: Self-pay

## 2021-04-30 ENCOUNTER — Telehealth (HOSPITAL_COMMUNITY): Payer: Self-pay | Admitting: *Deleted

## 2021-04-30 ENCOUNTER — Ambulatory Visit
Admission: RE | Admit: 2021-04-30 | Discharge: 2021-04-30 | Disposition: A | Discharge status: Home / Self Care | Payer: BC Managed Care – PPO | Source: Ambulatory Visit | Attending: Radiation Oncology | Admitting: Radiation Oncology

## 2021-04-30 VITALS — BP 102/75 | HR 66 | Temp 97.5°F | Resp 22

## 2021-04-30 DIAGNOSIS — F952 Tourette's disorder: Secondary | ICD-10-CM | POA: Insufficient documentation

## 2021-04-30 DIAGNOSIS — K219 Gastro-esophageal reflux disease without esophagitis: Secondary | ICD-10-CM | POA: Diagnosis not present

## 2021-04-30 DIAGNOSIS — Z923 Personal history of irradiation: Secondary | ICD-10-CM | POA: Diagnosis not present

## 2021-04-30 DIAGNOSIS — C61 Malignant neoplasm of prostate: Secondary | ICD-10-CM | POA: Insufficient documentation

## 2021-04-30 DIAGNOSIS — I4891 Unspecified atrial fibrillation: Secondary | ICD-10-CM | POA: Diagnosis not present

## 2021-04-30 DIAGNOSIS — I10 Essential (primary) hypertension: Secondary | ICD-10-CM | POA: Insufficient documentation

## 2021-04-30 DIAGNOSIS — R339 Retention of urine, unspecified: Secondary | ICD-10-CM | POA: Diagnosis not present

## 2021-04-30 DIAGNOSIS — E119 Type 2 diabetes mellitus without complications: Secondary | ICD-10-CM | POA: Insufficient documentation

## 2021-04-30 DIAGNOSIS — Z79899 Other long term (current) drug therapy: Secondary | ICD-10-CM | POA: Diagnosis not present

## 2021-04-30 DIAGNOSIS — C7951 Secondary malignant neoplasm of bone: Secondary | ICD-10-CM | POA: Insufficient documentation

## 2021-04-30 DIAGNOSIS — Z86718 Personal history of other venous thrombosis and embolism: Secondary | ICD-10-CM | POA: Insufficient documentation

## 2021-04-30 DIAGNOSIS — Z87891 Personal history of nicotine dependence: Secondary | ICD-10-CM | POA: Diagnosis not present

## 2021-04-30 DIAGNOSIS — E7800 Pure hypercholesterolemia, unspecified: Secondary | ICD-10-CM | POA: Diagnosis not present

## 2021-04-30 DIAGNOSIS — Z7901 Long term (current) use of anticoagulants: Secondary | ICD-10-CM | POA: Diagnosis not present

## 2021-04-30 DIAGNOSIS — Z7984 Long term (current) use of oral hypoglycemic drugs: Secondary | ICD-10-CM | POA: Insufficient documentation

## 2021-04-30 HISTORY — DX: Malignant neoplasm of prostate: C79.51

## 2021-04-30 HISTORY — DX: Malignant neoplasm of prostate: C61

## 2021-04-30 NOTE — Progress Notes (Signed)
Histology and Location of Primary Cancer:  Stage T2a adenocarcinoma of the prostate  Sites of Visceral and Bony Metastatic Disease:  NM Bone Scan Whole Body 04/08/2021 --IMPRESSION: Widespread osseous metastatic disease throughout the axial and appendicular skeleton, including multiple sites of uptake within BILATERAL humeri and femora. Consider radiographic assessment of the humeri and femora to exclude lesions at risk for pathologic fracture.  If Prostate Cancer, Gleason Score is (5 + 4 from 11/2017 biopsy), PSA (70.0 as of 04/04/21)  Past/Anticipated interventions by urology, if any:  04/22/2021 (office visit) Dr. Alexis Frock  12/22/2017 Dr. Louis Meckel --Transrectal ultrasound of prostate with biopsies   12/06/2013 Dr. Louis Meckel --Transrectal ultrasound of prostate with biopsies   Past/Anticipated chemotherapy by medical oncology, if any:  No referral placed at this time  Pain on a scale of 0-10 is: Reports persistent lower back pain (not currently being managed with any prescription pain medication)  If Spine Met(s), symptoms, if any, include: Bowel/Bladder retention or incontinence (please describe): Currently has catheter in place (waiting to hear from urologist about having it removed). Reports his bowel habits have changes since starting Erleada Numbness or weakness in extremities (please describe): Reports continued muscle weakness/ decrease in strength (both upper and lower extremities) Current Decadron regimen, if applicable: Not currently prescribed  Ambulatory status? Walker? Wheelchair?: Ambulatory without assistance, but becomes fatigued easily. Reports back pain can impact mobility as well  SAFETY ISSUES: Prior radiation? Yes: 09/19/2014-11/13/2014--prostate was treated to 78 Gy in 40 fractions of 1.95 Gy Pacemaker/ICD? No Possible current pregnancy? N/A Is the patient on methotrexate? No  Current Complaints / other details:  Reports  unintentional weight loss (~30 lbs in the past 1-1.5 month). Reports a new rash has developed to his arms, abdomen, and legs since he started [facility] prescription (reports he told a triage nurse and was told that was an expected side effect from medication). Also reports lower leg swelling that started about 4 days ago (again when he started [facility] prescription). Reports he's scheduled for F/U with his A.fib provider to see how his kidneys are handling furosemide. Reports labile appetite and feeling full/disinterested in food quickly

## 2021-04-30 NOTE — Progress Notes (Signed)
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    ICD-10-CM   1. Prostate cancer metastatic to bone ([place])  C61    C79.51     2. Malignant neoplasm of prostate metastatic to bone [facility])  C61    C79.51       HISTORY OF PRESENT ILLNESS: Tommy Yang is a 70 y.o. male seen at the request of Dr. Tresa Moore. We last saw him in 12/2014 following his definitive prostate radiation therapy concurrent with ADT.   In summary, he was initially diagnosed with Gleason 4+3 adenocarcinoma of the prostate on biopsy with Dr. Sallye Lat performed 11/10/2013 with a PSA of 5.55 at the time of diagnosis.  We initially met with him on 01/02/14, at which time he appeared to be leaning towards prostatectomy.  Unfortunately, prior to his procedure, he developed an acute DVT, requiring anticoagulation so he ultimately elected to proceed with ADT concurrent with external beam radiation therapy completed in March 2016.  He completed 12 months of ADT with a PSA nadir at 0.10 in June 2016.  Unfortunately, the PSA began rising in December 2016 and reached 2.03 in 10/2017. He proceeded to repeat biopsy on 12/22/17 showing a maximum Gleason score of 5+4 in 9 of 12 cores, including both seminal vesicle samples. He underwent PET scan for disease restaging on 01/05/18 and this showed asymmetric activity within the left lobe of the prostate as well as a solitary left external iliac node with moderate radiotracer activity but no evidence of skeletal metastasis or metastatic disease outside of the pelvis. He resumed Lupron ADT in 03/2018, and the lymph node was no longer visible  on repeat CT A/P without contrast performed on 08/11/19. He switched to Eligard and 12/2019.  He has also had chronic issues with recurrent urinary retention, post radiation therapy and has undergone TURP procedures in 09/2019 and in 10/2020.  Final surgical pathology obtained from the more recent procedure showed Gleason 5+4 prostatic carcinoma, as expected, without significant treatment effect. Unfortunately, during this time, his PSA has continued to rise despite ADT-- 10/2019 - 0.18 12/2019 - 0.23 05/2020 - 1.04 11/2020 - 8.42 04/2021 - 70.0  He underwent disease restaging with CT A/P on 04/08/21 which showed a new soft tissue thickening posteriorly about the prostate, closely abutting the rectum, with associated rectal wall thickening and adjacent perirectal and presacral fat stranding as well as multiple sclerotic osseous lesions, consistent with treated metastatic disease.this study also redemonstrated bilateral hydronephrosis and hydroureter to the ureterovesicular junctions, without any definite obstructing lesion seen.  A bone scan was performed the same day and also showed widespread osseous metastatic disease, including multiple sites of uptake within bilateral humeri and femora. He was started on [place] on 04/26/21 in addition to the Eligard ADT.  He has reviewed the new imaging findings with his urologist and has kindly been referred today to discuss the potential role of Xofigo in the management of diffuse osseous metastasis.  PREVIOUS RADIATION THERAPY: Yes  09/19/14 - 11/13/14: Prostate / 78 Gy in 40 fractions concurrent with ADT  PAST MEDICAL HISTORY:  Past Medical History:  Diagnosis Date   Arthritis  Atrial fibrillation ([place])    Complication of anesthesia    needs to sit up  at 45 degree angle or gets afib during surgery or in recovery   Diabetes mellitus without complication ([place])    type 2   DVT (deep venous thrombosis) ([place]) 2015   left leg.behind knee   Foley  catheter in place last changed 2eeks ago   last 7 months   GERD (gastroesophageal reflux disease)    Hx of radiation therapy 40 tx 2015   Hypercholesterolemia    Hypertension    Prostate cancer ([place]) 12/06/13   gleason 4+3=7, 6/12 cores positive. seed implant and radiation   Tourette's syndrome    Urinary retention    Wears glasses       PAST SURGICAL HISTORY: Past Surgical History:  Procedure Laterality Date   APPENDECTOMY  1968   BALLOON DILATION N/A 08/02/2015   Procedure: BALLOON DILATION;  Surgeon: [name], MD;  Location: [facility];  Service: Endoscopy;  Laterality: N/A;   CARDIOVERSION N/A 05/13/2018   Procedure: CARDIOVERSION;  Surgeon: Sanda Klein, MD;  Location: [facility];  Service: Cardiovascular;  Laterality: N/A;   CARDIOVERSION N/A 05/18/2018   Procedure: CARDIOVERSION;  Surgeon: Buford Dresser, MD;  Location: [facility];  Service: Cardiovascular;  Laterality: N/A;   COLONOSCOPY WITH PROPOFOL N/A 08/02/2015   Procedure: COLONOSCOPY WITH PROPOFOL w/ APC;  Surgeon: [name], MD;  Location: [facility];  Service: Endoscopy;  Laterality: N/A;   CYSTOSCOPY N/A 10/05/2020   Procedure: CYSTOSCOPY;  Surgeon: Alexis Frock, MD;  Location[facility];  Service: Urology;  Laterality: N/A;   ESOPHAGOGASTRODUODENOSCOPY (EGD) WITH PROPOFOL N/A 08/02/2015   Procedure: ESOPHAGOGASTRODUODENOSCOPY (EGD) WITH PROPOFOL/ possible dilation.;  Surgeon: [name], MD;  Location: [facility];  Service: Endoscopy;  Laterality: N/A;   KNEE SURGERY  2001   right knee orthoscopic   PROSTATE BIOPSY  12/06/13   Gleason 4+3=7, volume 35 gm   SPINE SURGERY  1963 and 1967   Tourette'ssyndrome spinal fluid removed    TONSILLECTOMY  as child   TRANSURETHRAL RESECTION OF PROSTATE N/A 09/30/2019   Procedure: TRANSURETHRAL RESECTION OF THE PROSTATE (TURP);  Surgeon: Alexis Frock, MD;  Location: [facility];  Service: Urology;  Laterality:  N/A;  1 HR   TRANSURETHRAL RESECTION OF PROSTATE N/A 10/05/2020   Procedure: TRANSURETHRAL RESECTION OF THE PROSTATE (TURP);  Surgeon: Alexis Frock, MD;  Location: [facility];  Service: Urology;  Laterality: N/A;  1 HR    FAMILY HISTORY:  Family History  Problem Relation Age of Onset   Heart disease Mother        before age 41   Diabetes Mother    Hyperlipidemia Mother    Varicose Veins Mother    Hypertension Mother    Heart disease Father    Diabetes Father    Hyperlipidemia Father    Hypertension Father    Cancer Brother    COPD Brother    Diabetes Brother    Hyperlipidemia Brother    Hypertension Brother     SOCIAL HISTORY:  Social History   Socioeconomic History   Marital status: Married    Spouse name: Not on file   Number of children: 1   Years of education: Not on file   Highest education level: Not on file  Occupational History    Employer: RETIRED  Tobacco Use   Smoking status: Former    Packs/day: 0.50    Years: 23.00  Pack years: 11.50    Types: Cigarettes    Quit date: 08/01/2012    Years since quitting: 8.7   Smokeless tobacco: Never  Vaping Use   Vaping Use: Never used  Substance and Sexual Activity   Alcohol use: Not Currently    Comment: very seldom, maybe 6x per year   Drug use: Never   Sexual activity: Not Currently    Birth control/protection: Abstinence  Other Topics Concern   Not on file  Social History Narrative   Married, lives with spouse   1 child, lives in [place] with his wife and 3 children   Retired - Patent examiner   Does car models/replicas for a hobby, 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 90 tablet 3   blood glucose meter kit and supplies KIT Test blood sugar daily as directed. Dx code: E11.9 1 each 0   CALCIUM PO Take 1,000 tablets by mouth daily.      CINNAMON PO Take 500 mg by mouth daily.      Cyanocobalamin (VITAMIN B 12 PO) Take by mouth. 1000 mg daily     docusate sodium (COLACE) 100 MG capsule Take 1 capsule (100 mg total) by mouth 2 (two) times daily. 60 capsule 11   Empagliflozin-metFORMIN HCl ER (SYNJARDY XR) 12.12-998 MG TB24 TAKE 2 TABLETS BY MOUTH EVERY DAY 180 tablet 3   ERLEADA 60 MG tablet Take 240 mg by mouth daily.     furosemide (LASIX) 20 MG tablet Take 1 tablet by mouth daily for the next 3 days then only as needed for swelling/shortness of breath 10 tablet 1   glimepiride (AMARYL) 1 MG tablet TAKE 1 TABLET BY MOUTH DAILY WITH BREAKFAST. 90 tablet 1   glucose blood (ACCU-CHEK GUIDE) test strip Use as instructed to check blood sugar twice daily. 100 each 3   leuprolide (LUPRON) 11.25 MG injection Inject 11.25 mg into the muscle every 6 (six) months.  (Patient not taking: Reported on 04/18/2021)     losartan (COZAAR) 50 MG tablet 1 tablet in the morning and half tablet in the evening 135 tablet 1   metoprolol tartrate (LOPRESSOR) 100 MG tablet TAKE 1 TABLET BY MOUTH TWICE A DAY 180 tablet 2   pantoprazole (PROTONIX) 40 MG tablet TAKE 1 TABLET BY MOUTH EVERY DAY 90 tablet 3   tamsulosin (FLOMAX) 0.4 MG CAPS capsule Take 1 capsule (0.4 mg total) by mouth daily. (Patient taking differently: Take 0.4 mg by mouth 2 (two) times daily.) 30 capsule 30   traMADol (ULTRAM) 50 MG tablet Take 1 tablet (50 mg total) by mouth every 6 (six) hours as needed for moderate pain or severe pain. Post-operatively 15  tablet 0   Vitamin D, Cholecalciferol, 400 UNITS TABS Take 400 Units by mouth daily.     XARELTO 20 MG TABS tablet TAKE 1 TABLET (20 MG  TOTAL) BY MOUTH DAILY BEFORE BREAKFAST. 90 tablet 3   zinc gluconate 50 MG tablet Take 50 mg by mouth daily.     No current facility-administered medications for this encounter.    REVIEW OF SYSTEMS:  On review of systems, the patient reports that he is doing well overall. He denies any chest pain, shortness of breath, cough, fevers, chills, night sweats. He denies abdominal pain, nausea or vomiting. He has continued to struggle with urinary retention and currently has an indwelling Foley catheter in place due to multiple failed voiding trial attempts.  He reports continued muscle weakness/decrease in strength, persistent lower back pain (not being managed with prescription pain medication), becoming easily fatigued, and unintentional weight loss of ~30 lbs in the past 1-1.5 months. He also notes a change in bowel habits, labile appetite, widespread rash, and LE swelling since starting the Erleada.  He denies any focal, site-specific bony pain at this time.  A complete review of systems is obtained and is otherwise negative.    PHYSICAL EXAM:  Wt Readings from Last 3 Encounters:  04/18/21 273 lb 3.2 oz (123.9 kg)  03/20/21 284 lb 3.2 oz (128.9 kg)  03/02/21 290 lb (131.5 kg)   Temp Readings from Last 3 Encounters:  04/30/21 (!) 97.5 F (36.4 C) (Oral)  03/02/21 98.1 F (36.7 C) (Oral)  03/01/21 98 F (36.7 C) (Oral)   BP Readings from Last 3 Encounters:  04/30/21 102/75  04/18/21 122/76  03/20/21 134/84   Pulse Readings from Last 3 Encounters:  04/30/21 66  04/18/21 75  03/20/21 (!) 106    /10  In general this is a well appearing Caucasian male in no acute distress. He's alert and oriented x4 and appropriate throughout the examination. Cardiopulmonary assessment is negative for acute distress and he exhibits normal effort.     KPS = 180  100  - Normal; no complaints; no evidence of disease. 90   - Able to carry on normal activity; minor signs or symptoms of disease. 80   - Normal activity with effort; some signs or symptoms of disease. 23   - Cares for self; unable to carry on normal activity or to do active work. 60   - Requires occasional assistance, but is able to care for most of his personal needs. 50   - Requires considerable assistance and frequent medical care. 18   - Disabled; requires special care and assistance. 28   - Severely disabled; hospital admission is indicated although death not imminent. 33   - Very sick; hospital admission necessary; active supportive treatment necessary. 10   - Moribund; fatal processes progressing rapidly. 0     - Dead  Karnofsky DA, Abelmann [place], Craver LS and Burchenal [facility] [phone]) The use of the nitrogen mustards in the palliative treatment of carcinoma: with particular reference to bronchogenic carcinoma Cancer 1 634-56  LABORATORY DATA:  Lab Results  Component Value Date   WBC 8.3 05/18/2020   HGB 13.6 10/06/2020   HCT 42.9 10/06/2020   MCV 83.5 05/18/2020   PLT 243 05/18/2020   Lab Results  Component Value Date   NA 141 04/15/2021   K 4.6 04/15/2021   CL 106 04/15/2021   CO2 25 04/15/2021   Lab Results  Component Value Date   ALT 9 08/03/2020   AST 11 08/03/2020   ALKPHOS 87 08/03/2020   BILITOT 0.5 08/03/2020     RADIOGRAPHY: NM Bone Scan Whole Body  Result Date: 04/09/2021 CLINICAL DATA:  Prostate cancer metastatic to bone, lower back pain, most recent PSA 8.42 EXAM: NUCLEAR MEDICINE WHOLE BODY BONE SCAN TECHNIQUE: Whole body anterior and posterior images were obtained approximately 3 hours after intravenous injection of radiopharmaceutical. RADIOPHARMACEUTICALS:  19.5 mCi Technetium-34mMDP IV COMPARISON:  None Radiographic correlation: CT abdomen and pelvis 04/08/2021 FINDINGS: Numerous sites of abnormal osseous tracer accumulation throughout the axial and appendicular  skeleton consistent with widespread osseous metastatic disease. The sites include calvaria, cervical/thoracic/lumbar spine, BILATERAL ribs, BILATERAL clavicles, RIGHT scapula, sternum, and pelvis. In addition, multiple foci of abnormal increased uptake are seen within the humeri and femora bilaterally. Expected urinary tract and soft tissue distribution of tracer. IMPRESSION: Widespread osseous metastatic disease throughout the axial and appendicular skeleton, including multiple sites of uptake within BILATERAL humeri and femora. Consider radiographic assessment of the humeri and femora to exclude lesions at risk for pathologic fracture. Electronically Signed   By: MLavonia DanaM.D.   On: 04/09/2021 08:13      IMPRESSION/PLAN: 1. 70y.o. 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 We discussed the natural history of metastatic, castrate resistant prostate cancer and general treatment, highlighting the role of Xogifo infusions in the management of diffuse osseous metastases. We focused on the details of logistics and delivery. The recommendation is to proceed with monthly infusions of Xofigo x6. We will monitor labs prior to each infusion to ensure it is safe to proceed with each treatment. We reviewed the anticipated acute and late sequelae associated with Xofigo in this setting. The patient was encouraged to ask questions that were answered to his stated satisfaction.  At the end of our conversation, the patient is interested in proceeding with Xofigo infusions. We will share this information with Dr. MTresa Moore and we will also reach out to Dr. SAlen Blew and medical oncology, to get his opinion on this patient regarding systemic treatment options. We will proceed with treatment planning accordingly and anticipate beginning the Xofigo treatments in the near future unless Dr.  SAlen Blewprefers to meet with the patient prior to proceeding with this treatment.   We personally spent 75 minutes in this encounter including chart review, reviewing radiological studies, meeting face-to-face with the patient, entering orders and completing documentation.    ANicholos Johns PA-C    MTyler Pita MD  [facility] Direct Dial: [phone] Fax: [phone]  Skype  LinkedIn   This document serves as a record of services personally performed by MTyler Pita MD and AFreeman Caldron PA-C. It was created on their behalf by KWilburn Mylar a trained medical scribe. The creation of this record is based on the scribe's personal observations and the provider's statements to them. This document has been checked and approved by the attending provider.

## 2021-04-30 NOTE — Progress Notes (Deleted)
1 

## 2021-04-30 NOTE — Telephone Encounter (Signed)
Patient having issues with lower extremity swelling since end of last week. He took PRN lasix over the weekend showed some improvement but only took for 2 days. Discussed with Roderic Palau NP will continue lasix '20mg'$  daily until swelling resolved and follow up Friday with bmet.  Pt also informed he started use of a drug Erleada, for prostate CA,  but pt is concerned as it would make xarelto less effective. I discussed with PharmD, and his best option for anticoagulation with this drug would be warfarin. This may have some interaction but she said that this could be overcome with frequent monitoring. I have sent this information to the coumadin clinic for further management. Pt has been on Erleada for 2 weeks pt reports.

## 2021-04-30 NOTE — Progress Notes (Deleted)
ra

## 2021-05-01 ENCOUNTER — Telehealth (INDEPENDENT_AMBULATORY_CARE_PROVIDER_SITE_OTHER): Payer: BC Managed Care – PPO

## 2021-05-01 DIAGNOSIS — Z7901 Long term (current) use of anticoagulants: Secondary | ICD-10-CM

## 2021-05-01 DIAGNOSIS — I4819 Other persistent atrial fibrillation: Secondary | ICD-10-CM | POA: Diagnosis not present

## 2021-05-01 NOTE — Telephone Encounter (Signed)
I spoke to the patient and scheduled New Coumadin appointment to assist in transition from Xarelto to Coumadin

## 2021-05-02 ENCOUNTER — Other Ambulatory Visit (HOSPITAL_COMMUNITY): Payer: Self-pay | Admitting: Radiation Oncology

## 2021-05-02 ENCOUNTER — Telehealth: Payer: Self-pay | Admitting: *Deleted

## 2021-05-02 DIAGNOSIS — C61 Malignant neoplasm of prostate: Secondary | ICD-10-CM

## 2021-05-02 DIAGNOSIS — C7951 Secondary malignant neoplasm of bone: Secondary | ICD-10-CM

## 2021-05-02 NOTE — Telephone Encounter (Signed)
Called patient to inform of lab and weight appt. On 05-10-21 @ 12:15 pm @ Tommy Yang and his Xofigo Inj. for 05-17-21- arrival time- 11:15 am @ [facility] Radiology, spoke with patient and he is aware of these appts.

## 2021-05-03 ENCOUNTER — Other Ambulatory Visit (HOSPITAL_COMMUNITY): Payer: Self-pay | Admitting: Radiation Oncology

## 2021-05-03 ENCOUNTER — Telehealth: Payer: Self-pay | Admitting: *Deleted

## 2021-05-03 ENCOUNTER — Other Ambulatory Visit: Payer: Self-pay

## 2021-05-03 ENCOUNTER — Telehealth: Payer: Self-pay | Admitting: Urology

## 2021-05-03 ENCOUNTER — Telehealth: Payer: Self-pay | Admitting: Oncology

## 2021-05-03 ENCOUNTER — Ambulatory Visit (HOSPITAL_COMMUNITY)
Admission: RE | Admit: 2021-05-03 | Discharge: 2021-05-03 | Disposition: A | Discharge status: Home / Self Care | Payer: BC Managed Care – PPO | Source: Ambulatory Visit | Attending: Physician Assistant | Admitting: Physician Assistant

## 2021-05-03 ENCOUNTER — Ambulatory Visit (INDEPENDENT_AMBULATORY_CARE_PROVIDER_SITE_OTHER): Payer: BC Managed Care – PPO

## 2021-05-03 DIAGNOSIS — I4819 Other persistent atrial fibrillation: Secondary | ICD-10-CM

## 2021-05-03 DIAGNOSIS — Z7901 Long term (current) use of anticoagulants: Secondary | ICD-10-CM

## 2021-05-03 DIAGNOSIS — C61 Malignant neoplasm of prostate: Secondary | ICD-10-CM

## 2021-05-03 DIAGNOSIS — C7951 Secondary malignant neoplasm of bone: Secondary | ICD-10-CM

## 2021-05-03 DIAGNOSIS — E1165 Type 2 diabetes mellitus with hyperglycemia: Secondary | ICD-10-CM | POA: Insufficient documentation

## 2021-05-03 LAB — BASIC METABOLIC PANEL
Anion gap: 8 (ref 5–15)
BUN: 15 mg/dL (ref 8–23)
CO2: 22 mmol/L (ref 22–32)
Calcium: 7.3 mg/dL — ABNORMAL LOW (ref 8.9–10.3)
Chloride: 109 mmol/L (ref 98–111)
Creatinine, Ser: 1.06 mg/dL (ref 0.61–1.24)
GFR, Estimated: >60 mL/min (ref >60)
Glucose, Bld: 175 mg/dL — ABNORMAL HIGH (ref 70–99)
Potassium: 4.4 mmol/L (ref 3.5–5.1)
Sodium: 139 mmol/L (ref 135–145)

## 2021-05-03 LAB — POCT INR: INR: 1.2 — AB (ref 2.0–3.0)

## 2021-05-03 MED ORDER — WARFARIN SODIUM 5 MG PO TABS: ORAL_TABLET | ORAL | 1 refills | Status: DC

## 2021-05-03 NOTE — Patient Instructions (Signed)
Take 1 tablet Daily.  Take Coumadin and Xarelto (Friday, Saturday and Sunday), then stop Xarelto...  INR in 1 week.   A full discussion of the nature of anticoagulants has been carried out.  A benefit risk analysis has been presented to the patient, so that they understand the justification for choosing anticoagulation at this time. The need for frequent and regular monitoring, precise dosage adjustment and compliance is stressed.  Side effects of potential bleeding are discussed.  The patient should avoid any OTC items containing aspirin or ibuprofen, and should avoid great swings in general diet.  Avoid alcohol consumption.  Call if any signs of abnormal bleeding.  [phone]

## 2021-05-03 NOTE — Telephone Encounter (Signed)
Scheduled appt per 9/2 referral. Pt is aware of appt date and time. Mailed updated calendar to pt per request.

## 2021-05-03 NOTE — Telephone Encounter (Signed)
PATIENT HAS AN APPT. WITH DR. SHADAD ON 05-15-21 @ 2 PM FOR CONSULTATION, AND PATIENT IS AWARE OF THIS APPT.

## 2021-05-03 NOTE — Telephone Encounter (Signed)
I called and spoke with the patient this morning to let him know that we had communicated with Dr. Alen Blew in medical oncology and that he was in agreement with proceeding with the Xofigo infusions now and was more than happy to see the patient in consult, non-urgent, to discuss potential options for additional systemic therapy going forward.  The patient is interested in meeting with Dr. Alen Blew so I advised that we will help to coordinate the consult visit in the near future and in the meantime, we will move forward as planned regarding the Xofigo infusions.  He appears to have a good understanding of his disease and our recommendations and is comfortable and in agreement with the stated plan.   Tommy Yang, MMS, PA-C [place] [facility] [place]: [phone]  Fax: [phone]

## 2021-05-04 ENCOUNTER — Telehealth: Payer: Self-pay | Admitting: Home Health

## 2021-05-04 NOTE — Telephone Encounter (Signed)
Patient called after-hours line today, reporting diarrhea from his cancer medication.  Called back at [phone], speak to patient directly, patient reports that he has been having diarrhea for 3 days since receiving a cancer medication, he states this is not a chemotherapy and was told it could potentially cause diarrhea.  He states he was switched from Xarelto to Coumadin yesterday by our office.  He is wondering if he should take some medication for diarrhea.  He is asking if Lasix or Coumadin are causing the diarrhea.  He is yelling on the phone and frustrated.   Informed the patient that Lasix and Coumadin are not typically causing diarrhea.  Given the new onset of diarrhea, infectious cause needs to be ruled out, before he should take any medication.  Advised patient to communicate with his oncology doctor before taking any further medication, as diarrhea is likely caused by the new oncology agent that he recently received.  Patient states he is not seeing any oncologist at the moment.  Advised patient to call the provider who prescribed him the cancer agent. He states that he would have to wait until Tuesday and hang up the phone.

## 2021-05-09 ENCOUNTER — Telehealth: Payer: Self-pay | Admitting: *Deleted

## 2021-05-09 NOTE — Telephone Encounter (Signed)
CALLED PATIENT TO REMIND OF LAB AND WEIGHT APPT. FOR 05-10-21 2 12:15 PM, SPOKE WITH PATIENT AND HE IS AWARE OF THESE APPTS.

## 2021-05-10 ENCOUNTER — Ambulatory Visit
Admission: RE | Admit: 2021-05-10 | Discharge: 2021-05-10 | Disposition: A | Discharge status: Home / Self Care | Payer: BC Managed Care – PPO | Source: Ambulatory Visit | Attending: Radiation Oncology | Admitting: Radiation Oncology

## 2021-05-10 ENCOUNTER — Ambulatory Visit (INDEPENDENT_AMBULATORY_CARE_PROVIDER_SITE_OTHER): Payer: BC Managed Care – PPO | Admitting: *Deleted

## 2021-05-10 ENCOUNTER — Other Ambulatory Visit: Payer: Self-pay

## 2021-05-10 DIAGNOSIS — Z7901 Long term (current) use of anticoagulants: Secondary | ICD-10-CM | POA: Diagnosis not present

## 2021-05-10 DIAGNOSIS — I4819 Other persistent atrial fibrillation: Secondary | ICD-10-CM | POA: Diagnosis not present

## 2021-05-10 DIAGNOSIS — C7951 Secondary malignant neoplasm of bone: Secondary | ICD-10-CM

## 2021-05-10 DIAGNOSIS — C61 Malignant neoplasm of prostate: Secondary | ICD-10-CM | POA: Diagnosis not present

## 2021-05-10 LAB — CBC WITH DIFFERENTIAL (CANCER CENTER ONLY)
Abs Immature Granulocytes: 0.03 10*3/uL (ref 0.00–0.07)
Basophils Absolute: 0.1 10*3/uL (ref 0.0–0.1)
Basophils Relative: 1 %
Eosinophils Absolute: 0.1 10*3/uL (ref 0.0–0.5)
Eosinophils Relative: 1 %
HCT: 34.6 % — ABNORMAL LOW (ref 39.0–52.0)
Hemoglobin: 10.2 g/dL — ABNORMAL LOW (ref 13.0–17.0)
Immature Granulocytes: 0 %
Lymphocytes Relative: 23 %
Lymphs Abs: 1.8 10*3/uL (ref 0.7–4.0)
MCH: 23.3 pg — ABNORMAL LOW (ref 26.0–34.0)
MCHC: 29.5 g/dL — ABNORMAL LOW (ref 30.0–36.0)
MCV: 79 fL — ABNORMAL LOW (ref 80.0–100.0)
Monocytes Absolute: 0.7 10*3/uL (ref 0.1–1.0)
Monocytes Relative: 9 %
Neutro Abs: 5.2 10*3/uL (ref 1.7–7.7)
Neutrophils Relative %: 66 %
Platelet Count: 323 10*3/uL (ref 150–400)
RBC: 4.38 MIL/uL (ref 4.22–5.81)
RDW: 22.8 % — ABNORMAL HIGH (ref 11.5–15.5)
WBC Count: 7.9 10*3/uL (ref 4.0–10.5)
nRBC: 0 % (ref 0.0–0.2)

## 2021-05-10 LAB — POCT INR: INR: 1.8 — AB (ref 2.0–3.0)

## 2021-05-10 NOTE — Patient Instructions (Signed)
Description   Take warfarin 1.5 tablets of warfarin today and then continue to take warfarin 1 tablet daily. Recheck INR in 1 week. Coumadin Clinic [phone]

## 2021-05-15 ENCOUNTER — Inpatient Hospital Stay: Payer: BC Managed Care – PPO | Attending: Oncology | Admitting: Oncology

## 2021-05-15 ENCOUNTER — Other Ambulatory Visit: Payer: Self-pay

## 2021-05-15 VITALS — BP 118/93 | HR 112 | Temp 98.6°F | Resp 18 | Wt 279.2 lb

## 2021-05-15 DIAGNOSIS — Z87891 Personal history of nicotine dependence: Secondary | ICD-10-CM | POA: Diagnosis not present

## 2021-05-15 DIAGNOSIS — C7951 Secondary malignant neoplasm of bone: Secondary | ICD-10-CM | POA: Diagnosis not present

## 2021-05-15 DIAGNOSIS — C61 Malignant neoplasm of prostate: Secondary | ICD-10-CM

## 2021-05-15 NOTE — Progress Notes (Signed)
Reason for the request:   Prostate cancer  HPI: I was asked by Dr. Tammi Klippel to evaluate Tommy Yang for the evaluation of prostate cancer.  He is a 70 year old man with a history of atrial fibrillation deep vein thrombosis and diagnosis of prostate cancer 2015.  At that time he presented with a Gleason score 4+3 = 7 in 6 out of 12 cores and a PSA of 5.5.  He has received definitive therapy with external beam radiation and ADT and completed 12 months of ADT with a PSA nadir of 0.1 in 2016.  He developed a rise in his PSA in 2016 up to 2.03 in 2019.  Repeat biopsy showed Gleason score of 5+4 = 9 and 9 out of 12 cores including seminal vesicle involvement.  Pet imaging for staging purposes showed asymmetric activity within the left lobe of the prostate as well as solitary left external iliac lymph node with moderate radiotracer activity.  He resumed Lupron ADT at that time and subsequently switched to Tommy Yang.  He had a good radiographic response in 2019 and underwent TURP procedure in 2021.  He continues to have persistent prostate cancer at that time.  His PSA started to rise on androgen deprivation in March 2021 to 0.18.  PSA was 1.0 in September 2021.  In April 2022 was 8.42.  In August 2022 was 70.0.  Staging work-up at that time showed a soft tissue thickening posteriorly to the prostate associated with rectal wall thickening with bone scan showed multiple widespread metastatic osseous disease. He was started on Tommy Yang on April 26, 2021 under the care of Dr. Tresa Moore and was evaluated by Dr. Tammi Klippel for Tommy Yang.  Clinically, he has reports multiple other complaints including back pain and leg pain as well as swelling and overall fatigue.  His performance status is limited but not dramatically different.  He is currently on warfarin is of history of thrombosis with INR close to therapeutic level of 1.8.  He does not report any headaches, blurry vision, syncope or seizures. Does not report any  fevers, chills or sweats.  Does not report any cough, wheezing or hemoptysis.  Does not report any chest pain, palpitation, orthopnea or leg edema.  Does not report any nausea, vomiting or abdominal pain.  Does not report any constipation or diarrhea.  Does not report any skeletal complaints.    Does not report frequency, urgency or hematuria.  Does not report any skin rashes or lesions. Does not report any heat or cold intolerance.  Does not report any lymphadenopathy or petechiae.  Does not report any anxiety or depression.  Remaining review of systems is negative.     Past Medical History:  Diagnosis Date   Arthritis    Atrial fibrillation ([place])    Complication of anesthesia    needs to sit up  at 45 degree angle or gets afib during surgery or in recovery   Diabetes mellitus without complication ([place])    type 2   DVT (deep venous thrombosis) ([place]) 2015   left leg.behind knee   Foley catheter in place last changed 2eeks ago   last 7 months   GERD (gastroesophageal reflux disease)    Hx of radiation therapy 40 tx 2015   Hypercholesterolemia    Hypertension    Prostate cancer ([place]) 12/06/13   gleason 4+3=7, 6/12 cores positive. seed implant and radiation   Tourette's syndrome    Urinary retention    Wears glasses   :   Past  Surgical History:  Procedure Laterality Date   APPENDECTOMY  1968   BALLOON DILATION N/A 08/02/2015   Procedure: BALLOON DILATION;  Surgeon: [name], MD;  Location: [facility];  Service: Endoscopy;  Laterality: N/A;   CARDIOVERSION N/A 05/13/2018   Procedure: CARDIOVERSION;  Surgeon: Sanda Klein, MD;  Location: [facility];  Service: Cardiovascular;  Laterality: N/A;   CARDIOVERSION N/A 05/18/2018   Procedure: CARDIOVERSION;  Surgeon: Buford Dresser, MD;  Location: [facility];  Service: Cardiovascular;  Laterality: N/A;   COLONOSCOPY WITH PROPOFOL N/A 08/02/2015   Procedure: COLONOSCOPY WITH PROPOFOL w/ APC;  Surgeon: [name], MD;   Location: [facility];  Service: Endoscopy;  Laterality: N/A;   CYSTOSCOPY N/A 10/05/2020   Procedure: CYSTOSCOPY;  Surgeon: Alexis Frock, MD;  [facility];  Service: Urology;  Laterality: N/A;   ESOPHAGOGASTRODUODENOSCOPY (EGD) WITH PROPOFOL N/A 08/02/2015   Procedure: ESOPHAGOGASTRODUODENOSCOPY (EGD) WITH PROPOFOL/ possible dilation.;  Surgeon: [name], MD;  [facility];  Service: Endoscopy;  Laterality: N/A;   KNEE SURGERY  2001   right knee orthoscopic   PROSTATE BIOPSY  12/06/13   Gleason 4+3=7, volume 35 gm   SPINE SURGERY  1963 and 1967   Tourette'ssyndrome spinal fluid removed    TONSILLECTOMY  as child   TRANSURETHRAL RESECTION OF PROSTATE N/A 09/30/2019   Procedure: TRANSURETHRAL RESECTION OF THE PROSTATE (TURP);  Surgeon: Alexis Frock, MD;  [facility];  Service: Urology;  Laterality: N/A;  1 HR   TRANSURETHRAL RESECTION OF PROSTATE N/A 10/05/2020   Procedure: TRANSURETHRAL RESECTION OF THE PROSTATE (TURP);  Surgeon: Alexis Frock, MD;  Location: [facility];  Service: Urology;  Laterality: N/A;  1 HR  :   Current Outpatient Medications:    atorvastatin (LIPITOR) 40 MG tablet, TAKE 1 TABLET BY MOUTH EVERY DAY, Disp: 90 tablet, Rfl: 3   atorvastatin (LIPITOR) 80 MG tablet, Take 1 tablet (80 mg total) by mouth daily., Disp: 90 tablet, Rfl: 3   blood glucose meter kit and supplies KIT, Test blood sugar daily as directed. Dx code: E11.9, Disp: 1 each, Rfl: 0   CALCIUM PO, Take 1,000 tablets by mouth daily. , Disp: , Rfl:    CINNAMON PO, Take 500 mg by mouth daily. , Disp: , Rfl:    Cyanocobalamin (VITAMIN B 12 PO), Take by mouth. 1000 mg daily, Disp: , Rfl:    docusate sodium (COLACE) 100 MG capsule, Take 1 capsule (100 mg total) by mouth 2 (two) times daily., Disp: 60 capsule, Rfl: 11   Empagliflozin-metFORMIN HCl ER (SYNJARDY XR) 12.12-998 MG TB24, TAKE 2 TABLETS BY MOUTH EVERY DAY, Disp: 180  tablet, Rfl: 3   ERLEADA 60 MG tablet, Take 240 mg by mouth daily., Disp: , Rfl:    furosemide (LASIX) 20 MG tablet, Take 1 tablet by mouth daily for the next 3 days then only as needed for swelling/shortness of breath, Disp: 10 tablet, Rfl: 1   glimepiride (AMARYL) 1 MG tablet, TAKE 1 TABLET BY MOUTH DAILY WITH BREAKFAST., Disp: 90 tablet, Rfl: 1   glucose blood (ACCU-CHEK GUIDE) test strip, Use as instructed to check blood sugar twice daily., Disp: 100 each, Rfl: 3   leuprolide (LUPRON) 11.25 MG injection, Inject 11.25 mg into the muscle every 6 (six) months.  (Patient not taking: Reported on 04/18/2021), Disp: , Rfl:    losartan (COZAAR) 50 MG tablet, 1 tablet in the morning and half tablet in the evening, Disp: 135 tablet,  Rfl: 1   metoprolol tartrate (LOPRESSOR) 100 MG tablet, TAKE 1 TABLET BY MOUTH TWICE A DAY, Disp: 180 tablet, Rfl: 2   pantoprazole (PROTONIX) 40 MG tablet, TAKE 1 TABLET BY MOUTH EVERY DAY, Disp: 90 tablet, Rfl: 3   tamsulosin (FLOMAX) 0.4 MG CAPS capsule, Take 1 capsule (0.4 mg total) by mouth daily. (Patient taking differently: Take 0.4 mg by mouth 2 (two) times daily.), Disp: 30 capsule, Rfl: 30   traMADol (ULTRAM) 50 MG tablet, Take 1 tablet (50 mg total) by mouth every 6 (six) hours as needed for moderate pain or severe pain. Post-operatively, Disp: 15 tablet, Rfl: 0   Vitamin D, Cholecalciferol, 400 UNITS TABS, Take 400 Units by mouth daily., Disp: , Rfl:    warfarin (COUMADIN) 5 MG tablet, Take 1-2 tablets Daily or as prescribed by [facility]., Disp: 60 tablet, Rfl: 1   zinc gluconate 50 MG tablet, Take 50 mg by mouth daily., Disp: , Rfl: :   Allergies  Allergen Reactions   Other     shingls shot caused atrial fib did not take 2nd shot  :   Family History  Problem Relation Age of Onset   Heart disease Mother        before age 82   Diabetes Mother    Hyperlipidemia Mother    Varicose Veins Mother    Hypertension Mother    Heart disease Father     Diabetes Father    Hyperlipidemia Father    Hypertension Father    Cancer Brother    COPD Brother    Diabetes Brother    Hyperlipidemia Brother    Hypertension Brother   :   Social History   Socioeconomic History   Marital status: Married    Spouse name: Not on file   Number of children: 1   Years of education: Not on file   Highest education level: Not on file  Occupational History    Employer: RETIRED  Tobacco Use   Smoking status: Former    Packs/day: 0.50    Years: 23.00    Pack years: 11.50    Types: Cigarettes    Quit date: 08/01/2012    Years since quitting: 8.7   Smokeless tobacco: Never  Vaping Use   Vaping Use: Never used  Substance and Sexual Activity   Alcohol use: Not Currently    Comment: very seldom, maybe 6x per year   Drug use: Never   Sexual activity: Not Currently    Birth control/protection: Abstinence  Other Topics Concern   Not on file  Social History Narrative   Married, lives with spouse   1 child, lives in [place] with his wife and 3 children   Retired - Patent examiner   Does car models/replicas for a hobby, 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  Exam: Blood pressure (!) 118/93, pulse (!) 112, temperature 98.6 F (37 C), temperature source Oral, resp. rate 18, weight 279 lb 3 oz (126.6 kg), SpO2 96 %. ECOG 1 General appearance: alert and cooperative appeared without distress. Head: atraumatic without any abnormalities. Eyes: conjunctivae/corneas  clear. PERRL.  Sclera anicteric. Throat: lips, mucosa, and tongue normal; without oral thrush or ulcers. Resp: clear to auscultation  bilaterally without rhonchi, wheezes or dullness to percussion. Cardio: regular rate and rhythm, S1, S2 normal, no murmur, click, rub or gallop GI: soft, non-tender; bowel sounds normal; no masses,  no organomegaly Skin: Skin color, texture, turgor normal. No rashes or lesions Lymph nodes: Cervical, supraclavicular, and axillary nodes normal. Neurologic: Grossly normal without any motor, sensory or deep tendon reflexes. Musculoskeletal: No joint deformity or effusion.    Assessment and Plan:    70 year old man with:  1.  Castration-resistant advanced prostate cancer with disease to the bone documented in August of the 2022.  He was initially diagnosed in 2015 with a Gleason score 4+3 = 6 and a PSA of 5.5.  He was started on Erleada in August 2022  The natural course of this disease was discussed at this time and treatment choices were reviewed.  He understands that this is an incurable malignancy and any treatment is palliative at this time.  Treatment options for castration-resistant disease include androgen receptor pathway inhibitors which was recently started appropriately, Xofigo, PARP inhibitor, systemic chemotherapy among others.  After discussion today, I have recommended continuing Erleada and consider continuing with Xofigo as well given his extensive symptomatic bone disease.  These drugs are usually put in sequence rather than combination but he would be reasonable to treat given his extensive disease and symptoms.  If he develops progression of disease on this current treatment, I would recommend obtaining guardant 360 analysis and consider PARP inhibitor or systemic chemotherapy next.  After systemic chemotherapy lutetium 117 would be an option.  2.  Follow-up: He will return in the future as needed after he developed progression of disease on the current treatment.    60  minutes were dedicated to this visit. The time was spent on reviewing laboratory data, imaging studies,  discussing treatment options, and answering questions regarding future plan.    A copy of this consult has been forwarded to the requesting physician.

## 2021-05-16 ENCOUNTER — Encounter: Payer: Self-pay | Admitting: Internal Medicine

## 2021-05-16 ENCOUNTER — Telehealth: Payer: Self-pay | Admitting: *Deleted

## 2021-05-16 ENCOUNTER — Ambulatory Visit: Payer: BC Managed Care – PPO | Admitting: Internal Medicine

## 2021-05-16 VITALS — BP 132/70 | HR 88 | Temp 99.9°F | Ht 71.0 in | Wt 279.0 lb

## 2021-05-16 DIAGNOSIS — C61 Malignant neoplasm of prostate: Secondary | ICD-10-CM | POA: Diagnosis not present

## 2021-05-16 DIAGNOSIS — I10 Essential (primary) hypertension: Secondary | ICD-10-CM

## 2021-05-16 DIAGNOSIS — E1165 Type 2 diabetes mellitus with hyperglycemia: Secondary | ICD-10-CM | POA: Diagnosis not present

## 2021-05-16 DIAGNOSIS — R609 Edema, unspecified: Secondary | ICD-10-CM

## 2021-05-16 MED ORDER — TRAMADOL HCL 50 MG PO TABS: 50.0000 mg | ORAL_TABLET | Freq: Four times a day (QID) | ORAL | 0 refills | Status: DC | PRN

## 2021-05-16 MED ORDER — FUROSEMIDE 20 MG PO TABS: ORAL_TABLET | ORAL | 1 refills | Status: DC

## 2021-05-16 NOTE — Telephone Encounter (Signed)
Called patient to remind of Xofigo Inj. for 05-17-21- arrival time- 11:15 @ [facility], spoke with patient and he is aware of this injection

## 2021-05-16 NOTE — Patient Instructions (Addendum)
Please take all new medication as prescribed - the lasix '20mg'$  per day as needed, and the tramadol as needed for pain  Please continue all other medications as before, and refills have been done if requested.  Please have the pharmacy call with any other refills you may need.  Please continue your efforts at being more active, low cholesterol diet, and weight control.  You are otherwise up to date with prevention measures today.  Please keep your appointments with your specialists as you may have planned  Please make an Appointment to return in 3 months

## 2021-05-16 NOTE — Progress Notes (Signed)
Patient ID: Tommy Yang, male   DOB: 1950/09/16, 70 y.o.   MRN: [id number]        Chief Complaint: follow up "knot behind the knee"       HPI:  Tommy Yang is a 70 y.o. male here with above, but on arrival states he really means swelling worsening to the distal legs to the knees x 2 wks for unclear reasons, without change in diet, increased po fluids, other med changes and Pt denies chest pain, increased sob or doe, wheezing, orthopnea, PND, palpitations, dizziness or syncope.  Swelling seems to be worse in the pm, better in the am after leg elevation at night.  Also has known metastatic prostate ca with worsening lower back pain c/w local metastasis.  Pain currently 6/10, and no worsening LE radicular pain.   Has hx of LLE DVT but this swelling is different.  Does not want extensive testing.  Last echo aug 2020 with EF 55-60% with hx of PAF.   Pt denies polydipsia, polyuria, or new focal neuro s/s.         Wt Readings from Last 3 Encounters:  05/16/21 279 lb (126.6 kg)  05/15/21 279 lb 3 oz (126.6 kg)  04/18/21 273 lb 3.2 oz (123.9 kg)   BP Readings from Last 3 Encounters:  05/16/21 132/70  05/15/21 (!) 118/93  04/30/21 102/75         Past Medical History:  Diagnosis Date   Arthritis    Atrial fibrillation ([facility])    Complication of anesthesia    needs to sit up  at 45 degree angle or gets afib during surgery or in recovery   Diabetes mellitus without complication ([place])    type 2   DVT (deep venous thrombosis) ([place]) 2015   left leg.behind knee   Foley catheter in place last changed 2eeks ago   last 7 months   GERD (gastroesophageal reflux disease)    Hx of radiation therapy 40 tx 2015   Hypercholesterolemia    Hypertension    Prostate cancer ([place]) 12/06/13   gleason 4+3=7, 6/12 cores positive. seed implant and radiation   Tourette's syndrome    Urinary retention    Wears glasses    Past Surgical History:  Procedure Laterality Date   APPENDECTOMY  1968   BALLOON  DILATION N/A 08/02/2015   Procedure: BALLOON DILATION;  Surgeon: [name], MD;  [facility]: [facility];  Service: Endoscopy;  Laterality: N/A;   CARDIOVERSION N/A 05/13/2018   Procedure: CARDIOVERSION;  Surgeon: Sanda Klein, MD;  Location: [facility];  Service: Cardiovascular;  Laterality: N/A;   CARDIOVERSION N/A 05/18/2018   Procedure: CARDIOVERSION;  Surgeon: Buford Dresser, MD;  Location: [facility];  Service: Cardiovascular;  Laterality: N/A;   COLONOSCOPY WITH PROPOFOL N/A 08/02/2015   Procedure: COLONOSCOPY WITH PROPOFOL w/ APC;  Surgeon: [name], MD;  Location: [facility];  Service: Endoscopy;  Laterality: N/A;   CYSTOSCOPY N/A 10/05/2020   Procedure: CYSTOSCOPY;  Surgeon: Alexis Frock, MD;  Location[facility];  Service: Urology;  Laterality: N/A;   ESOPHAGOGASTRODUODENOSCOPY (EGD) WITH PROPOFOL N/A 08/02/2015   Procedure: ESOPHAGOGASTRODUODENOSCOPY (EGD) WITH PROPOFOL/ possible dilation.;  Surgeon: [name], MD;  [facility];  Service: Endoscopy;  Laterality: N/A;   KNEE SURGERY  2001   right knee orthoscopic   PROSTATE BIOPSY  12/06/13   Gleason 4+3=7, volume 35 gm   SPINE SURGERY  1963 and 1967   Tourette'ssyndrome spinal fluid removed  TONSILLECTOMY  as child   TRANSURETHRAL RESECTION OF PROSTATE N/A 09/30/2019   Procedure: TRANSURETHRAL RESECTION OF THE PROSTATE (TURP);  Surgeon: Alexis Frock, MD;  Location: [facility];  Service: Urology;  Laterality: N/A;  1 HR   TRANSURETHRAL RESECTION OF PROSTATE N/A 10/05/2020   Procedure: TRANSURETHRAL RESECTION OF THE PROSTATE (TURP);  Surgeon: Alexis Frock, MD;  Location: [facility];  Service: Urology;  Laterality: N/A;  1 HR    reports that he quit smoking about 8 years ago. His smoking use included cigarettes. He has a 11.50 pack-year smoking history. He has never used smokeless tobacco. He reports that he does not currently use alcohol.  He reports that he does not use drugs. family history includes COPD in his brother; Cancer in his brother; Diabetes in his brother, father, and mother; Heart disease in his father and mother; Hyperlipidemia in his brother, father, and mother; Hypertension in his brother, father, and mother; Varicose Veins in his mother. Allergies  Allergen Reactions   Other     shingls shot caused atrial fib did not take 2nd shot   Current Outpatient Medications on File Prior to Visit  Medication Sig Dispense Refill   atorvastatin (LIPITOR) 40 MG tablet TAKE 1 TABLET BY MOUTH EVERY DAY 90 tablet 3   atorvastatin (LIPITOR) 80 MG tablet Take 1 tablet (80 mg total) by mouth daily. 90 tablet 3   blood glucose meter kit and supplies KIT Test blood sugar daily as directed. Dx code: E11.9 1 each 0   CALCIUM PO Take 1,000 tablets by mouth daily.      CINNAMON PO Take 500 mg by mouth daily.      Cyanocobalamin (VITAMIN B 12 PO) Take by mouth. 1000 mg daily     docusate sodium (COLACE) 100 MG capsule Take 1 capsule (100 mg total) by mouth 2 (two) times daily. 60 capsule 11   Empagliflozin-metFORMIN HCl ER (SYNJARDY XR) 12.12-998 MG TB24 TAKE 2 TABLETS BY MOUTH EVERY DAY 180 tablet 3   ERLEADA 60 MG tablet Take 240 mg by mouth daily.     glimepiride (AMARYL) 1 MG tablet TAKE 1 TABLET BY MOUTH DAILY WITH BREAKFAST. 90 tablet 1   glucose blood (ACCU-CHEK GUIDE) test strip Use as instructed to check blood sugar twice daily. 100 each 3   leuprolide (LUPRON) 11.25 MG injection Inject 11.25 mg into the muscle every 6 (six) months.     losartan (COZAAR) 50 MG tablet 1 tablet in the morning and half tablet in the evening 135 tablet 1   metoprolol tartrate (LOPRESSOR) 100 MG tablet TAKE 1 TABLET BY MOUTH TWICE A DAY 180 tablet 2   pantoprazole (PROTONIX) 40 MG tablet TAKE 1 TABLET BY MOUTH EVERY DAY 90 tablet 3   tamsulosin (FLOMAX) 0.4 MG CAPS capsule Take 1 capsule (0.4 mg total) by mouth daily. (Patient taking differently:  Take 0.4 mg by mouth 2 (two) times daily.) 30 capsule 30   Vitamin D, Cholecalciferol, 400 UNITS TABS Take 400 Units by mouth daily.     warfarin (COUMADIN) 5 MG tablet Take 1-2 tablets Daily or as prescribed by [facility]mad[facility] Clinic. 60 tablet 1   zinc gluconate 50 MG tablet Take 50 mg by mouth daily.     No current facility-administered medications on file prior to visit.        ROS:  All others reviewed and negative.  Objective        PE:  BP 132/70 (BP Location: Left  Arm, Patient Position: Sitting, Cuff Size: Large)   Pulse 88   Temp 99.9 F (37.7 C) (Oral)   Ht _0  (1.803 m)   Wt 279 lb (126.6 kg)   SpO2 96%   BMI 38.91 kg/m                 Constitutional: Pt appears in NAD               HENT: Head: NCAT.                Right Ear: External ear normal.                 Left Ear: External ear normal.                Eyes: . Pupils are equal, round, and reactive to light. Conjunctivae and EOM are normal               Nose: without d/c or deformity               Neck: Neck supple. Gross normal ROM               Cardiovascular: Normal rate and regular rhythm.                 Pulmonary/Chest: Effort normal and breath sounds without rales or wheezing.                Abd:  Soft, NT, ND, + BS, no organomegaly; spine; nontender in midline               Neurological: Pt is alert. At baseline orientation, motor grossly intact               Skin: Skin is warm. No rashes, no other new lesions, LE edema - trace to 1+ bilateral to knees               Psychiatric: Pt behavior is normal without agitation   Micro: none  Cardiac tracings I have personally interpreted today:  none  Pertinent Radiological findings (summarize): none   Lab Results  Component Value Date   WBC 7.9 05/10/2021   HGB 10.2 (L) 05/10/2021   HCT 34.6 (L) 05/10/2021   PLT 323 05/10/2021   GLUCOSE 175 (H) 05/03/2021   CHOL 155 08/03/2020   TRIG 137.0 08/03/2020   HDL 36.60 (L) 08/03/2020   LDLDIRECT 93.0 12/30/2016    LDLCALC 91 08/03/2020   ALT 9 08/03/2020   AST 11 08/03/2020   NA 139 05/03/2021   K 4.4 05/03/2021   CL 109 05/03/2021   CREATININE 1.06 05/03/2021   BUN 15 05/03/2021   CO2 22 05/03/2021   TSH 1.12 05/18/2020   PSA 0.93 05/18/2020   INR 1.5 (A) 05/17/2021   HGBA1C 7.7 (H) 04/15/2021   MICROALBUR 39.7 (H) 12/31/2020   Assessment/Plan:  XHAIDEN COOMBS is a 70 y.o. White or Caucasian [1] male with  has a past medical history of Arthritis, Atrial fibrillation ([place]), Complication of anesthesia, Diabetes mellitus without complication ([place]), DVT (deep venous thrombosis) ([place]) (2015), Foley catheter in place (last changed 2eeks ago), GERD (gastroesophageal reflux disease), radiation therapy (40 tx 2015), Hypercholesterolemia, Hypertension, Prostate cancer ([place]) (12/06/13), Tourette's syndrome, Urinary retention, and Wears glasses.  Peripheral edema Uncontrolled, for lasix 20 qd prn, compression stockings, leg elevation, low salt diet, wt contro,  to f/u any worsening symptoms or concerns  Type II diabetes mellitus, uncontrolled ([place]) Lab Results  Component Value Date   HGBA1C 7.7 (H) 04/15/2021   Mild uncontrolled, pt to continue current medical treatment synjardy, amaryl and f/u endo as planned  Essential hypertension BP Readings from Last 3 Encounters:  05/16/21 132/70  05/15/21 (!) 118/93  04/30/21 102/75   Stable, pt to continue medical treatment losartan, lopressor   Prostate cancer [facility]) Now metastatic with worsening back pain - for tramadol prn,  to f/u any worsening symptoms or concerns  Followup: No follow-ups on file.  Cathlean Cower, MD 05/20/2021 3:27 PM [facility]

## 2021-05-17 ENCOUNTER — Other Ambulatory Visit: Payer: Self-pay

## 2021-05-17 ENCOUNTER — Ambulatory Visit (INDEPENDENT_AMBULATORY_CARE_PROVIDER_SITE_OTHER): Payer: BC Managed Care – PPO

## 2021-05-17 ENCOUNTER — Ambulatory Visit (HOSPITAL_COMMUNITY)
Admission: RE | Admit: 2021-05-17 | Discharge: 2021-05-17 | Disposition: A | Discharge status: Home / Self Care | Payer: BC Managed Care – PPO | Source: Ambulatory Visit | Attending: Radiation Oncology | Admitting: Radiation Oncology

## 2021-05-17 ENCOUNTER — Telehealth: Payer: Self-pay | Admitting: Endocrinology

## 2021-05-17 DIAGNOSIS — I4819 Other persistent atrial fibrillation: Secondary | ICD-10-CM

## 2021-05-17 DIAGNOSIS — C7951 Secondary malignant neoplasm of bone: Secondary | ICD-10-CM

## 2021-05-17 DIAGNOSIS — E1165 Type 2 diabetes mellitus with hyperglycemia: Secondary | ICD-10-CM

## 2021-05-17 DIAGNOSIS — Z7901 Long term (current) use of anticoagulants: Secondary | ICD-10-CM | POA: Diagnosis not present

## 2021-05-17 DIAGNOSIS — C61 Malignant neoplasm of prostate: Secondary | ICD-10-CM

## 2021-05-17 LAB — POCT INR: INR: 1.5 — AB (ref 2.0–3.0)

## 2021-05-17 MED ORDER — OZEMPIC (1 MG/DOSE) 4 MG/3ML ~~LOC~~ SOPN: 1.0000 mg | PEN_INJECTOR | SUBCUTANEOUS | 3 refills | Status: DC

## 2021-05-17 NOTE — Telephone Encounter (Signed)
Ozempic has been sent

## 2021-05-17 NOTE — Progress Notes (Signed)
  Radiation Oncology         [phone] ________________________________  Name: Tommy Yang MRN: [id number]  Date: 05/17/2021  DOB: [birth year]  Xofigo Treatment Planning Note:  Diagnosis:  Castration resistant prostate cancer with painful bone involvement  Narrative: Tommy Yang is a patient who has been diagnosed with castration resistant prostate cancer with painful bone involvement.  His most recent blood counts show that he remains a good candidate to proceed with Ra-223.  The patient is going to receive Xofigo for his treatment.   Radiation Treatment Planning:  The prescribed radiation activity will be 50 kBq per kg per infusions. The plan is to offer a total of 6 IV administrations of this agent, assuming the blood counts are adequate prior to each administration, with each infusion done at 4 week intervals.  This will be done as an IV administration in the nuclear medicine department, with care to undertake all radiation protection precautions as recommended.   ________________________________  Sheral Apley. Tammi Klippel, M.D.

## 2021-05-17 NOTE — Progress Notes (Signed)
  Radiation Oncology         [phone] ________________________________  Name: Tommy Yang MRN: [id number]  Date: 05/17/2021  DOB: 04-29-1951  Radium-223 Infusion Note  Diagnosis:  Castration resistant prostate cancer with painful bone involvement  Current Infusion:    1  Planned Infusions:  6  Narrative: Tommy Yang presented to nuclear medicine for treatment. His most recent blood counts were reviewed.  He remains a good candidate to proceed with Ra-223.  The patient was situated in an infusion suite with a contact barrier placed under his arm. Intravenous access was established, using sterile technique, and a normal saline infusion from a syringe was started.  Micro-dosimetry:  The prescribed radiation activity was assayed and confirmed to be within specified tolerance.  Special Treatment Procedure - Infusion:  The nuclear medicine technologist and I personally verified the dose activity to be delivered as specified in the written directive, and verified the patient identification via 2 separate methods.  The syringe containing the dose was attached to an intravenous access and the dose delivered over a minute. No complications were noted.  The total administered dose was 191.7 microcuries.   A saline flush of the line and the syringe that contained the isotope was then performed.  The residual radioactivity in the syringe was 4.06 microcuries, so the actual infused isotope activity was 187.64 microcuries.   Pressure was applied to the venipuncture site, and a compression bandage placed.   Radiation Safety personnel were present to perform the discharge survey, as detailed on their documentation.   After a short period of observation, the patient had his IV removed.  Impression:  The patient tolerated his infusion relatively well.  Plan:  The patient will return in one month for ongoing care.    ________________________________  Sheral Apley. Tammi Klippel, M.D.

## 2021-05-17 NOTE — Patient Instructions (Signed)
Take warfarin 2 tablets  today and then increase to take warfarin 1 tablet daily, except 1.5 tablets Monday and Friday. Recheck INR in 1 week. [facility] [phone]

## 2021-05-17 NOTE — Telephone Encounter (Signed)
Pt calling in voiced that there is a problem with insurance when it comes to trulicty and patient would like to be back on ozempic for the 90 day supply.  [id number]- [place], [place] - [address]

## 2021-05-20 DIAGNOSIS — R600 Localized edema: Secondary | ICD-10-CM | POA: Insufficient documentation

## 2021-05-20 DIAGNOSIS — R609 Edema, unspecified: Secondary | ICD-10-CM | POA: Insufficient documentation

## 2021-05-20 NOTE — Assessment & Plan Note (Signed)
BP Readings from Last 3 Encounters:  05/16/21 132/70  05/15/21 (!) 118/93  04/30/21 102/75   Stable, pt to continue medical treatment losartan, lopressor

## 2021-05-20 NOTE — Assessment & Plan Note (Signed)
Uncontrolled, for lasix 20 qd prn, compression stockings, leg elevation, low salt diet, wt contro,  to f/u any worsening symptoms or concerns

## 2021-05-20 NOTE — Assessment & Plan Note (Addendum)
Lab Results  Component Value Date   HGBA1C 7.7 (H) 04/15/2021   Mild uncontrolled, pt to continue current medical treatment synjardy, amaryl and f/u endo as planned

## 2021-05-20 NOTE — Assessment & Plan Note (Signed)
Now metastatic with worsening back pain - for tramadol prn,  to f/u any worsening symptoms or concerns

## 2021-05-24 ENCOUNTER — Ambulatory Visit (INDEPENDENT_AMBULATORY_CARE_PROVIDER_SITE_OTHER): Payer: BC Managed Care – PPO

## 2021-05-24 ENCOUNTER — Other Ambulatory Visit: Payer: Self-pay

## 2021-05-24 DIAGNOSIS — I4819 Other persistent atrial fibrillation: Secondary | ICD-10-CM | POA: Diagnosis not present

## 2021-05-24 DIAGNOSIS — Z7901 Long term (current) use of anticoagulants: Secondary | ICD-10-CM | POA: Diagnosis not present

## 2021-05-24 LAB — POCT INR: INR: 1.6 — AB (ref 2.0–3.0)

## 2021-05-24 NOTE — Patient Instructions (Signed)
Take warfarin 2 tablets  today and then increase to take warfarin 1 tablet daily, except 1.5 tablets Monday, Wednesday and Friday. Recheck INR in 1 week. [facility] [phone]

## 2021-05-27 ENCOUNTER — Other Ambulatory Visit: Payer: BC Managed Care – PPO

## 2021-05-27 ENCOUNTER — Other Ambulatory Visit: Payer: Self-pay | Admitting: Nurse Practitioner

## 2021-05-28 DIAGNOSIS — R338 Other retention of urine: Secondary | ICD-10-CM | POA: Diagnosis not present

## 2021-05-31 ENCOUNTER — Other Ambulatory Visit (HOSPITAL_COMMUNITY): Payer: Self-pay | Admitting: Radiation Oncology

## 2021-05-31 ENCOUNTER — Ambulatory Visit (INDEPENDENT_AMBULATORY_CARE_PROVIDER_SITE_OTHER): Payer: BC Managed Care – PPO

## 2021-05-31 ENCOUNTER — Encounter: Payer: BC Managed Care – PPO | Admitting: Internal Medicine

## 2021-05-31 ENCOUNTER — Other Ambulatory Visit: Payer: Self-pay

## 2021-05-31 ENCOUNTER — Telehealth: Payer: Self-pay | Admitting: *Deleted

## 2021-05-31 DIAGNOSIS — C7951 Secondary malignant neoplasm of bone: Secondary | ICD-10-CM

## 2021-05-31 DIAGNOSIS — I4819 Other persistent atrial fibrillation: Secondary | ICD-10-CM | POA: Diagnosis not present

## 2021-05-31 DIAGNOSIS — C61 Malignant neoplasm of prostate: Secondary | ICD-10-CM

## 2021-05-31 DIAGNOSIS — Z7901 Long term (current) use of anticoagulants: Secondary | ICD-10-CM | POA: Diagnosis not present

## 2021-05-31 LAB — POCT INR: INR: 1.5 — AB (ref 2.0–3.0)

## 2021-05-31 NOTE — Telephone Encounter (Signed)
CALLED PATIENT TO INFORM OF LAB AND WEIGHT APPT. ON 06-11-21 @ 12 PM @ [place]. ON 06-18-21 - ARRIVAL TIME- 11:45 AM @ [facility], SPOKE WITH PATIENT AND HE IS AWARE OF THESE APPTS.

## 2021-05-31 NOTE — Patient Instructions (Signed)
Take warfarin 2 tablets  today and then increase to  1.5 tablets daily, except 1 tablet Monday and Friday. Recheck INR in 1 week. [facility] [phone]

## 2021-06-06 NOTE — Addendum Note (Signed)
Encounter addended by: Tyler Pita, MD on: 06/06/2021 10:13 AM  Actions taken: Medication List reviewed, Problem List reviewed, Allergies reviewed, Visit diagnoses modified, Clinical Note Signed

## 2021-06-07 ENCOUNTER — Ambulatory Visit (INDEPENDENT_AMBULATORY_CARE_PROVIDER_SITE_OTHER): Payer: BC Managed Care – PPO

## 2021-06-07 ENCOUNTER — Other Ambulatory Visit: Payer: Self-pay

## 2021-06-07 DIAGNOSIS — I4819 Other persistent atrial fibrillation: Secondary | ICD-10-CM

## 2021-06-07 DIAGNOSIS — Z7901 Long term (current) use of anticoagulants: Secondary | ICD-10-CM

## 2021-06-07 LAB — POCT INR: INR: 2.8 (ref 2.0–3.0)

## 2021-06-07 NOTE — Patient Instructions (Signed)
Continue taking 1.5 tablets daily, except 1 tablet Monday and Friday. Recheck INR in 3 weeks. [facility] [phone]

## 2021-06-10 ENCOUNTER — Telehealth: Payer: Self-pay | Admitting: *Deleted

## 2021-06-10 ENCOUNTER — Other Ambulatory Visit: Payer: Self-pay

## 2021-06-10 DIAGNOSIS — C61 Malignant neoplasm of prostate: Secondary | ICD-10-CM

## 2021-06-10 NOTE — Telephone Encounter (Signed)
CALLED PATIENT TO REMIND OF LAB AND WEIGHT APPT. FOR 06-11-21@ 12 PM @ [place], SPOKE WITH PATIENT AND HE IS AWARE OF THIS APPT.

## 2021-06-11 ENCOUNTER — Ambulatory Visit
Admission: RE | Admit: 2021-06-11 | Discharge: 2021-06-11 | Disposition: A | Discharge status: Home / Self Care | Payer: BC Managed Care – PPO | Source: Ambulatory Visit | Attending: Radiation Oncology | Admitting: Radiation Oncology

## 2021-06-11 ENCOUNTER — Other Ambulatory Visit: Payer: Self-pay

## 2021-06-11 ENCOUNTER — Other Ambulatory Visit (HOSPITAL_COMMUNITY): Payer: Self-pay | Admitting: Urology

## 2021-06-11 DIAGNOSIS — C61 Malignant neoplasm of prostate: Secondary | ICD-10-CM | POA: Insufficient documentation

## 2021-06-11 LAB — CBC WITH DIFFERENTIAL (CANCER CENTER ONLY)
Abs Immature Granulocytes: 0.01 10*3/uL (ref 0.00–0.07)
Basophils Absolute: 0 10*3/uL (ref 0.0–0.1)
Basophils Relative: 1 %
Eosinophils Absolute: 0.1 10*3/uL (ref 0.0–0.5)
Eosinophils Relative: 2 %
HCT: 37.7 % — ABNORMAL LOW (ref 39.0–52.0)
Hemoglobin: 11.2 g/dL — ABNORMAL LOW (ref 13.0–17.0)
Immature Granulocytes: 0 %
Lymphocytes Relative: 28 %
Lymphs Abs: 1.3 10*3/uL (ref 0.7–4.0)
MCH: 23.7 pg — ABNORMAL LOW (ref 26.0–34.0)
MCHC: 29.7 g/dL — ABNORMAL LOW (ref 30.0–36.0)
MCV: 79.9 fL — ABNORMAL LOW (ref 80.0–100.0)
Monocytes Absolute: 0.7 10*3/uL (ref 0.1–1.0)
Monocytes Relative: 15 %
Neutro Abs: 2.5 10*3/uL (ref 1.7–7.7)
Neutrophils Relative %: 54 %
Platelet Count: 309 10*3/uL (ref 150–400)
RBC: 4.72 MIL/uL (ref 4.22–5.81)
RDW: 23.9 % — ABNORMAL HIGH (ref 11.5–15.5)
WBC Count: 4.6 10*3/uL (ref 4.0–10.5)
nRBC: 0 % (ref 0.0–0.2)

## 2021-06-13 ENCOUNTER — Telehealth: Payer: Self-pay | Admitting: Internal Medicine

## 2021-06-13 NOTE — Telephone Encounter (Signed)
I think this is a new problem, pt to plesae consider ROV

## 2021-06-13 NOTE — Telephone Encounter (Signed)
Pt. Has called and states that he is currently taking Imodium for his diarrhea. States he has been having episodes of diarrhea 4-5X/hr. Requesting stronger prescription to help manage diarrhea.

## 2021-06-14 NOTE — Telephone Encounter (Signed)
Patient does not want to come for an OV due to having excessive diarrhea. Per patient has diarrhea up to 4 times per hours. Patient thinks that cancer medication is the cause of this and was told by other doctors to contact PCP.

## 2021-06-14 NOTE — Telephone Encounter (Signed)
Patient scheduled for 06/18/21

## 2021-06-14 NOTE — Telephone Encounter (Signed)
Ok for immodium otc prn  Needs ROV

## 2021-06-15 ENCOUNTER — Other Ambulatory Visit: Payer: Self-pay | Admitting: Endocrinology

## 2021-06-17 ENCOUNTER — Telehealth: Payer: Self-pay | Admitting: *Deleted

## 2021-06-17 NOTE — Telephone Encounter (Signed)
CALLED PATIENT TO REMIND OF XOFIGO INJ. FOR 06-18-21- ARRIVAL TIME- 11:45 AM @ [facility], SPOKE WITH PATIENT AND HE IS AWARE OF THIS INJ.

## 2021-06-18 ENCOUNTER — Encounter: Payer: Self-pay | Admitting: Internal Medicine

## 2021-06-18 ENCOUNTER — Ambulatory Visit (HOSPITAL_COMMUNITY)
Admission: RE | Admit: 2021-06-18 | Discharge: 2021-06-18 | Disposition: A | Discharge status: Home / Self Care | Payer: BC Managed Care – PPO | Source: Ambulatory Visit | Attending: Radiation Oncology | Admitting: Radiation Oncology

## 2021-06-18 ENCOUNTER — Ambulatory Visit (INDEPENDENT_AMBULATORY_CARE_PROVIDER_SITE_OTHER): Payer: BC Managed Care – PPO | Admitting: Internal Medicine

## 2021-06-18 ENCOUNTER — Other Ambulatory Visit: Payer: Self-pay

## 2021-06-18 VITALS — BP 120/70 | HR 83 | Temp 99.3°F | Ht 71.0 in | Wt 255.0 lb

## 2021-06-18 DIAGNOSIS — R1032 Left lower quadrant pain: Secondary | ICD-10-CM | POA: Diagnosis not present

## 2021-06-18 DIAGNOSIS — C7951 Secondary malignant neoplasm of bone: Secondary | ICD-10-CM | POA: Diagnosis not present

## 2021-06-18 DIAGNOSIS — C61 Malignant neoplasm of prostate: Secondary | ICD-10-CM | POA: Diagnosis not present

## 2021-06-18 DIAGNOSIS — E1165 Type 2 diabetes mellitus with hyperglycemia: Secondary | ICD-10-CM

## 2021-06-18 DIAGNOSIS — I10 Essential (primary) hypertension: Secondary | ICD-10-CM

## 2021-06-18 DIAGNOSIS — K529 Noninfective gastroenteritis and colitis, unspecified: Secondary | ICD-10-CM | POA: Diagnosis not present

## 2021-06-18 DIAGNOSIS — R197 Diarrhea, unspecified: Secondary | ICD-10-CM | POA: Insufficient documentation

## 2021-06-18 LAB — HEPATIC FUNCTION PANEL
ALT: 6 U/L (ref 0–53)
AST: 7 U/L (ref 0–37)
Albumin: 3.6 g/dL (ref 3.5–5.2)
Alkaline Phosphatase: 100 U/L (ref 39–117)
Bilirubin, Direct: 0.1 mg/dL (ref 0.0–0.3)
Total Bilirubin: 0.4 mg/dL (ref 0.2–1.2)
Total Protein: 6.8 g/dL (ref 6.0–8.3)

## 2021-06-18 LAB — BASIC METABOLIC PANEL
BUN: 18 mg/dL (ref 6–23)
CO2: 24 mEq/L (ref 19–32)
Calcium: 8.2 mg/dL — ABNORMAL LOW (ref 8.4–10.5)
Chloride: 108 mEq/L (ref 96–112)
Creatinine, Ser: 0.94 mg/dL (ref 0.40–1.50)
GFR: 83.16 mL/min (ref >60.00)
Glucose, Bld: 77 mg/dL (ref 70–99)
Potassium: 4.8 mEq/L (ref 3.5–5.1)
Sodium: 141 mEq/L (ref 135–145)

## 2021-06-18 MED ORDER — RADIUM RA 223 DICHLORIDE 30 MCCI/ML IV SOLN
166.8000 | Freq: Once | INTRAVENOUS | Status: AC
  Administered 2021-06-18: 166.8 via INTRAVENOUS

## 2021-06-18 MED ORDER — DIPHENOXYLATE-ATROPINE 2.5-0.025 MG PO TABS: 1.0000 | ORAL_TABLET | Freq: Four times a day (QID) | ORAL | 1 refills | Status: DC | PRN

## 2021-06-18 NOTE — Progress Notes (Signed)
Patient ID: Tommy Yang, male   DOB: Aug 12, 1951, 70 y.o.   MRN: [id number]       Chief Complaint: follow up diarrhea       HPI:  Tommy Yang is a 70 y.o. male here with c/o 5 wks onset diarrhea, gradually worse over this time now with marked frequency, went 15 times yesterday and 4 this am already; seemed to start 1 week after radiation, denies fever, vomiting, blood though hs been nausea, and left sided discomfort with gurgling sensatioins.  Immodium only helps minimally.  Has less appetite, has lost 25 lbs recently.  Nothing else seems to make better or worse.   No recent antibiotix.  Pt denies chest pain, increased sob or doe, wheezing, orthopnea, PND, increased LE swelling, palpitations, dizziness or syncope.   Pt denies polydipsia, polyuria,       Wt Readings from Last 3 Encounters:  06/18/21 255 lb (115.7 kg)  05/16/21 279 lb (126.6 kg)  05/15/21 279 lb 3 oz (126.6 kg)   BP Readings from Last 3 Encounters:  06/18/21 120/70  05/16/21 132/70  05/15/21 (!) 118/93         Past Medical History:  Diagnosis Date   Arthritis    Atrial fibrillation (HCC)    Complication of anesthesia    needs to sit up  at 45 degree angle or gets afib during surgery or in recovery   Diabetes mellitus without complication ([place])    type 2   DVT (deep venous thrombosis) ([place]) 2015   left leg.behind knee   Foley catheter in place last changed 2eeks ago   last 7 months   GERD (gastroesophageal reflux disease)    Hx of radiation therapy 40 tx 2015   Hypercholesterolemia    Hypertension    Prostate cancer ([place]) 12/06/13   gleason 4+3=7, 6/12 cores positive. seed implant and radiation   Tourette's syndrome    Urinary retention    Wears glasses    Past Surgical History:  Procedure Laterality Date   APPENDECTOMY  1968   BALLOON DILATION N/A 08/02/2015   Procedure: BALLOON DILATION;  Surgeon: [name], MD;  Location: [facility];  Service: Endoscopy;  Laterality: N/A;   CARDIOVERSION  N/A 05/13/2018   Procedure: CARDIOVERSION;  Surgeon: Sanda Klein, MD;  Location: [facility];  Service: Cardiovascular;  Laterality: N/A;   CARDIOVERSION N/A 05/18/2018   Procedure: CARDIOVERSION;  Surgeon: Buford Dresser, MD;  Location: [facility];  Service: Cardiovascular;  Laterality: N/A;   COLONOSCOPY WITH PROPOFOL N/A 08/02/2015   Procedure: COLONOSCOPY WITH PROPOFOL w/ APC;  Surgeon: [name], MD;  Location: [facility];  Service: Endoscopy;  Laterality: N/A;   CYSTOSCOPY N/A 10/05/2020   Procedure: CYSTOSCOPY;  Surgeon: Alexis Frock, MD;  Location: [facility];  Service: Urology;  Laterality: N/A;   ESOPHAGOGASTRODUODENOSCOPY (EGD) WITH PROPOFOL N/A 08/02/2015   Procedure: ESOPHAGOGASTRODUODENOSCOPY (EGD) WITH PROPOFOL/ possible dilation.;  Surgeon: [name], MD;  Location: [facility];  Service: Endoscopy;  Laterality: N/A;   KNEE SURGERY  2001   right knee orthoscopic   PROSTATE BIOPSY  12/06/13   Gleason 4+3=7, volume 35 gm   SPINE SURGERY  1963 and 1967   Tourette'ssyndrome spinal fluid removed    TONSILLECTOMY  as child   TRANSURETHRAL RESECTION OF PROSTATE N/A 09/30/2019   Procedure: TRANSURETHRAL RESECTION OF THE PROSTATE (TURP);  Surgeon: Alexis Frock, MD;  Location: [facility];  Service: Urology;  Laterality: N/A;  1 HR  TRANSURETHRAL RESECTION OF PROSTATE N/A 10/05/2020   Procedure: TRANSURETHRAL RESECTION OF THE PROSTATE (TURP);  Surgeon: Alexis Frock, MD;  Location: [facility];  Service: Urology;  Laterality: N/A;  1 HR    reports that he quit smoking about 8 years ago. His smoking use included cigarettes. He has a 11.50 pack-year smoking history. He has never used smokeless tobacco. He reports that he does not currently use alcohol. He reports that he does not use drugs. family history includes COPD in his brother; Cancer in his brother; Diabetes in his brother, father, and mother; Heart disease  in his father and mother; Hyperlipidemia in his brother, father, and mother; Hypertension in his brother, father, and mother; Varicose Veins in his mother. Allergies  Allergen Reactions   Other     shingls shot caused atrial fib did not take 2nd shot   Current Outpatient Medications on File Prior to Visit  Medication Sig Dispense Refill   atorvastatin (LIPITOR) 40 MG tablet TAKE 1 TABLET BY MOUTH EVERY DAY 90 tablet 3   atorvastatin (LIPITOR) 80 MG tablet TAKE 1 TABLET BY MOUTH EVERY DAY 90 tablet 0   blood glucose meter kit and supplies KIT Test blood sugar daily as directed. Dx code: E11.9 1 each 0   CALCIUM PO Take 1,000 tablets by mouth daily.      CINNAMON PO Take 500 mg by mouth daily.      Cyanocobalamin (VITAMIN B 12 PO) Take by mouth. 1000 mg daily     docusate sodium (COLACE) 100 MG capsule Take 1 capsule (100 mg total) by mouth 2 (two) times daily. 60 capsule 11   Empagliflozin-metFORMIN HCl ER (SYNJARDY XR) 12.12-998 MG TB24 TAKE 2 TABLETS BY MOUTH EVERY DAY 180 tablet 3   ERLEADA 60 MG tablet Take 240 mg by mouth daily.     furosemide (LASIX) 20 MG tablet Take 1 tablet by mouth daily as needed 90 tablet 1   glucose blood (ACCU-CHEK GUIDE) test strip Use as instructed to check blood sugar twice daily. 100 each 3   leuprolide (LUPRON) 11.25 MG injection Inject 11.25 mg into the muscle every 6 (six) months.     losartan (COZAAR) 50 MG tablet 1 tablet in the morning and half tablet in the evening 135 tablet 1   metoprolol tartrate (LOPRESSOR) 100 MG tablet TAKE 1 TABLET BY MOUTH TWICE A DAY 180 tablet 2   pantoprazole (PROTONIX) 40 MG tablet TAKE 1 TABLET BY MOUTH EVERY DAY 90 tablet 3   Semaglutide, 1 MG/DOSE, (OZEMPIC, 1 MG/DOSE,) 4 MG/3ML SOPN Inject 1 mg into the skin once a week. 3 mL 3   tamsulosin (FLOMAX) 0.4 MG CAPS capsule Take 1 capsule (0.4 mg total) by mouth daily. (Patient taking differently: Take 0.4 mg by mouth 2 (two) times daily.) 30 capsule 30   traMADol (ULTRAM)  50 MG tablet Take 1 tablet (50 mg total) by mouth every 6 (six) hours as needed for moderate pain or severe pain. Post-operatively 30 tablet 0   Vitamin D, Cholecalciferol, 400 UNITS TABS Take 400 Units by mouth daily.     zinc gluconate 50 MG tablet Take 50 mg by mouth daily.     No current facility-administered medications on file prior to visit.        ROS:  All others reviewed and negative.  Objective        PE:  BP 120/70 (BP Location: Right Arm, Patient Position: Sitting, Cuff Size: Large)   Pulse 83  Temp 99.3 F (37.4 C) (Oral)   Ht '5\' 11"'  (1.803 m)   Wt 255 lb (115.7 kg)   SpO2 98%   BMI 35.57 kg/m                 Constitutional: Pt appears in NAD               HENT: Head: NCAT.                Right Ear: External ear normal.                 Left Ear: External ear normal.                Eyes: . Pupils are equal, round, and reactive to light. Conjunctivae and EOM are normal               Nose: without d/c or deformity               Neck: Neck supple. Gross normal ROM               Cardiovascular: Normal rate and regular rhythm.                 Pulmonary/Chest: Effort normal and breath sounds without rales or wheezing.                Abd:  Soft, NT, ND, + BS, no organomegaly               Neurological: Pt is alert. At baseline orientation, motor grossly intact               Skin: Skin is warm. No rashes, no other new lesions, LE edema - none               Psychiatric: Pt behavior is normal without agitation   Micro: none  Cardiac tracings I have personally interpreted today:  none  Pertinent Radiological findings (summarize): none   Lab Results  Component Value Date   WBC 4.6 06/11/2021   HGB 11.2 (L) 06/11/2021   HCT 37.7 (L) 06/11/2021   PLT 309 06/11/2021   GLUCOSE 77 06/18/2021   CHOL 155 08/03/2020   TRIG 137.0 08/03/2020   HDL 36.60 (L) 08/03/2020   LDLDIRECT 93.0 12/30/2016   LDLCALC 91 08/03/2020   ALT 6 06/18/2021   AST 7 06/18/2021   NA 141  06/18/2021   K 4.8 06/18/2021   CL 108 06/18/2021   CREATININE 0.94 06/18/2021   BUN 18 06/18/2021   CO2 24 06/18/2021   TSH 1.12 05/18/2020   PSA 0.93 05/18/2020   INR 2.8 06/07/2021   HGBA1C 7.7 (H) 04/15/2021   MICROALBUR 39.7 (H) 12/31/2020   Assessment/Plan:  Tommy Yang is a 70 y.o. White or Caucasian [1] male with  has a past medical history of Arthritis, Atrial fibrillation ([place]), Complication of anesthesia, Diabetes mellitus without complication ([place]), DVT (deep venous thrombosis) ([place]) (2015), Foley catheter in place (last changed 2eeks ago), GERD (gastroesophageal reflux disease), radiation therapy (40 tx 2015), Hypercholesterolemia, Hypertension, Prostate cancer ([place]) (12/06/13), Tourette's syndrome, Urinary retention, and Wears glasses.  Diarrhea Etiology unclear, pt non toxic but will need labs including cbc, and stool testing, lomotil prn, refer GI, to f/u any worsening symptoms or concerns  Essential hypertension BP Readings from Last 3 Encounters:  06/18/21 120/70  05/16/21 132/70  05/15/21 (!) 118/93   Stable, pt to continue medical treatment losartan, lopressor   Diabetes (  [place]) Lab Results  Component Value Date   HGBA1C 7.7 (H) 04/15/2021   Mild uncontrolled, , pt to continue current medical treatment synjardy, declines change for now  Followup: Return if symptoms worsen or fail to improve.  Cathlean Cower, MD 06/24/2021 9:54 PM [facility]

## 2021-06-18 NOTE — Patient Instructions (Signed)
Please take all new medication as prescribed   - the lomotil for diarrhea as needed  Please continue all other medications as before, and refills have been done if requested.  Please have the pharmacy call with any other refills you may need.  Please keep your appointments with your specialists as you may have planned  Please go to the LAB at the blood drawing area for the tests to be done - for blood and stool testing  You will be contacted by phone if any changes need to be made immediately.  Otherwise, you will receive a letter about your results with an explanation, but please check with MyChart first.  Please remember to sign up for MyChart if you have not done so, as this will be important to you in the future with finding out test results, communicating by private email, and scheduling acute appointments online when needed.  Please make an Appointment to return in 3 months, or sooner if needed

## 2021-06-19 ENCOUNTER — Other Ambulatory Visit: Payer: BC Managed Care – PPO

## 2021-06-19 DIAGNOSIS — R197 Diarrhea, unspecified: Secondary | ICD-10-CM

## 2021-06-19 DIAGNOSIS — R1032 Left lower quadrant pain: Secondary | ICD-10-CM | POA: Diagnosis not present

## 2021-06-19 DIAGNOSIS — K529 Noninfective gastroenteritis and colitis, unspecified: Secondary | ICD-10-CM | POA: Diagnosis not present

## 2021-06-19 DIAGNOSIS — R8271 Bacteriuria: Secondary | ICD-10-CM | POA: Diagnosis not present

## 2021-06-20 ENCOUNTER — Other Ambulatory Visit: Payer: Self-pay | Admitting: Endocrinology

## 2021-06-20 ENCOUNTER — Other Ambulatory Visit: Payer: Self-pay | Admitting: Nurse Practitioner

## 2021-06-20 ENCOUNTER — Encounter: Payer: Self-pay | Admitting: Internal Medicine

## 2021-06-20 LAB — GI PROFILE, STOOL, PCR

## 2021-06-20 LAB — CLOSTRIDIUM DIFFICILE BY PCR: Toxigenic C. Difficile by PCR: NEGATIVE

## 2021-06-24 ENCOUNTER — Telehealth: Payer: Self-pay | Admitting: Internal Medicine

## 2021-06-24 NOTE — Assessment & Plan Note (Signed)
Lab Results  Component Value Date   HGBA1C 7.7 (H) 04/15/2021   Mild uncontrolled, , pt to continue current medical treatment synjardy, declines change for now

## 2021-06-24 NOTE — Assessment & Plan Note (Signed)
Etiology unclear, pt non toxic but will need labs including cbc, and stool testing, lomotil prn, refer GI, to f/u any worsening symptoms or concerns

## 2021-06-24 NOTE — Telephone Encounter (Signed)
Very sorry, but unfortunately that is the best one - the lomotil he already has.  He could try a fiber supplement to see if this will help such as metamucil, though.   thanks

## 2021-06-24 NOTE — Telephone Encounter (Signed)
Pt. Has called today and stated that the medication prescribed at his last visit for his diarrhea does not seem to be helping. States he is still experiencing extreme diarrhea, and would like to know what else he can take.    Please advise.   Callback #- [phone]

## 2021-06-24 NOTE — Assessment & Plan Note (Signed)
BP Readings from Last 3 Encounters:  06/18/21 120/70  05/16/21 132/70  05/15/21 (!) 118/93   Stable, pt to continue medical treatment losartan, lopressor

## 2021-06-25 ENCOUNTER — Other Ambulatory Visit (HOSPITAL_COMMUNITY): Payer: BC Managed Care – PPO

## 2021-06-25 NOTE — Telephone Encounter (Signed)
Patient notified

## 2021-06-28 ENCOUNTER — Other Ambulatory Visit: Payer: Self-pay | Admitting: Endocrinology

## 2021-06-28 ENCOUNTER — Other Ambulatory Visit: Payer: Self-pay

## 2021-06-28 ENCOUNTER — Ambulatory Visit (INDEPENDENT_AMBULATORY_CARE_PROVIDER_SITE_OTHER): Payer: BC Managed Care – PPO

## 2021-06-28 DIAGNOSIS — I4819 Other persistent atrial fibrillation: Secondary | ICD-10-CM

## 2021-06-28 DIAGNOSIS — Z7901 Long term (current) use of anticoagulants: Secondary | ICD-10-CM | POA: Diagnosis not present

## 2021-06-28 DIAGNOSIS — I10 Essential (primary) hypertension: Secondary | ICD-10-CM

## 2021-06-28 LAB — POCT INR: INR: 1.7 — AB (ref 2.0–3.0)

## 2021-06-28 NOTE — Patient Instructions (Signed)
Take 2 tablets today only and then Continue taking 1.5 tablets daily, except 1 tablet Monday and Friday. Recheck INR in 2 weeks. Coumadin Clinic [phone]

## 2021-07-01 ENCOUNTER — Telehealth: Payer: Self-pay | Admitting: *Deleted

## 2021-07-01 NOTE — Telephone Encounter (Signed)
CALLED PATIENT TO INFORM OF LAB AND WEIGHT APPT. FOR 07-12-21 @ 12 PM @ [place]. ON 07-19-21- ARRIVAL TIME - 11:45 AM @ [facility], SPOKE WITH PATIENT AND HE IS AWARE OF THESE APPTS.

## 2021-07-04 ENCOUNTER — Other Ambulatory Visit: Payer: Self-pay

## 2021-07-04 ENCOUNTER — Other Ambulatory Visit (INDEPENDENT_AMBULATORY_CARE_PROVIDER_SITE_OTHER): Payer: BC Managed Care – PPO

## 2021-07-04 DIAGNOSIS — E1129 Type 2 diabetes mellitus with other diabetic kidney complication: Secondary | ICD-10-CM

## 2021-07-04 DIAGNOSIS — E782 Mixed hyperlipidemia: Secondary | ICD-10-CM | POA: Diagnosis not present

## 2021-07-04 DIAGNOSIS — E1165 Type 2 diabetes mellitus with hyperglycemia: Secondary | ICD-10-CM | POA: Diagnosis not present

## 2021-07-04 LAB — LIPID PANEL
Cholesterol: 158 mg/dL (ref 0–200)
HDL: 39 mg/dL — ABNORMAL LOW (ref >39.00)
LDL Cholesterol: 92 mg/dL (ref 0–99)
NonHDL: 118.74
Total CHOL/HDL Ratio: 4
Triglycerides: 134 mg/dL (ref 0.0–149.0)
VLDL: 26.8 mg/dL (ref 0.0–40.0)

## 2021-07-04 LAB — BASIC METABOLIC PANEL
BUN: 20 mg/dL (ref 6–23)
CO2: 25 mEq/L (ref 19–32)
Calcium: 8.3 mg/dL — ABNORMAL LOW (ref 8.4–10.5)
Chloride: 110 mEq/L (ref 96–112)
Creatinine, Ser: 1 mg/dL (ref 0.40–1.50)
GFR: 77.19 mL/min (ref >60.00)
Glucose, Bld: 84 mg/dL (ref 70–99)
Potassium: 4.4 mEq/L (ref 3.5–5.1)
Sodium: 144 mEq/L (ref 135–145)

## 2021-07-04 LAB — HEMOGLOBIN A1C: Hgb A1c MFr Bld: 6.3 % (ref 4.6–6.5)

## 2021-07-09 NOTE — Progress Notes (Signed)
Patient ID: Tommy Yang, male   DOB: [birth year], 70 y.o.   MRN: [id number]           Reason for Appointment: Follow-up     History of Present Illness:          Diagnosis: Type 2 diabetes mellitus, date of diagnosis: 05/2013       Past history:   He apparently had significant hyperglycemia in 2014 when seen in the urgent care center but did not establish with a PCP for control. He was started on treatment for his diabetes in 10/2013 and he was initially treated with metformin and Januvia A few months later he was also given glipizide ER to help his control when his A1c had gone up to 10.9 He says he was also seen by dietitian but no record available of this. Subsequently his blood sugar control had improved with A1c down to 6.8 in 9/15 On his consultation in 3/16 he had high blood sugars and weight gain; A1c was 9.1% with taking glipizide ER, Januvia and metformin.  Recent history:    Non-insulin hypoglycemic drugs the patient is taking are: Ozempic 1 mg weekly, Amaryl 1 mg at dinner,  Synjardy XR 12.12/998 2 tablets daily  His A1c is down to 6.3   Current blood sugar patterns and problems identified: He was supposed to stop his Ozempic from the last visit but he forgot and has continued this  Although he had been losing weight and having decreased appetite his weight has leveled off  Blood sugars however are mostly near normal with some readings at all different times  He is mostly eating 2 meals a day  No nausea with Ozempic However he has diarrhea which he says is from his chemotherapy, previously had no difficulties with metformin Continues to have weakness and not active at all   Side effects from medications have been: None  Compliance with the medical regimen: Fair  Hypoglycemia: None    Glucose monitoring:  done 1 times a day         Glucometer:  [place]  Blood Glucose readings by meter reports  AVERAGE 108 for 30 days, blood sugar range 75-136, most  readings in the morning  Previously FASTING = 180-115 After meals = 130-160 usually Average last month 127      Self-care: The diet that the patient has been following is: tries to limit portions and fats .   Dietician visit, most recent:?  2015 .               Weight history:    Wt Readings from Last 3 Encounters:  07/10/21 259 lb (117.5 kg)  06/18/21 255 lb (115.7 kg)  05/16/21 279 lb (126.6 kg)    Glycemic control:   Lab Results  Component Value Date   HGBA1C 6.3 07/04/2021   HGBA1C 7.7 (H) 04/15/2021   HGBA1C 7.1 (H) 12/31/2020   Lab Results  Component Value Date   MICROALBUR 39.7 (H) 12/31/2020   LDLCALC 92 07/04/2021   CREATININE 1.00 07/04/2021   Lab Results  Component Value Date   HGB 11.2 (L) 06/11/2021     Lab on 07/04/2021  Component Date Value Ref Range Status   Cholesterol 07/04/2021 158  0 - 200 mg/dL Final   ATP III Classification       Desirable:  < 200 mg/dL               Borderline High:  200 - 239 mg/dL  High:  > = 240 mg/dL   Triglycerides 07/04/2021 134.0  0.0 - 149.0 mg/dL Final   Normal:  <150 mg/dLBorderline High:  150 - 199 mg/dL   HDL 07/04/2021 39.00 (A)  >39.00 mg/dL Final   VLDL 07/04/2021 26.8  0.0 - 40.0 mg/dL Final   LDL Cholesterol 07/04/2021 92  0 - 99 mg/dL Final   Total CHOL/HDL Ratio 07/04/2021 4   Final                  Men          Women1/2 Average Risk     3.4          3.3Average Risk          5.0          4.42X Average Risk          9.6          7.13X Average Risk          15.0          11.0                       NonHDL 07/04/2021 118.74   Final   NOTE:  Non-HDL goal should be 30 mg/dL higher than patient's LDL goal (i.e. LDL goal of < 70 mg/dL, would have non-HDL goal of < 100 mg/dL)   Sodium 07/04/2021 144  135 - 145 mEq/L Final   Potassium 07/04/2021 4.4  3.5 - 5.1 mEq/L Final   Chloride 07/04/2021 110  96 - 112 mEq/L Final   CO2 07/04/2021 25  19 - 32 mEq/L Final   Glucose, Bld 07/04/2021 84  70 - 99 mg/dL  Final   BUN 07/04/2021 20  6 - 23 mg/dL Final   Creatinine, Ser 07/04/2021 1.00  0.40 - 1.50 mg/dL Final   GFR 07/04/2021 77.19  >60.00 mL/min Final   Calculated using the CKD-EPI Creatinine Equation (2021)   Calcium 07/04/2021 8.3 (A)  8.4 - 10.5 mg/dL Final   Hgb A1c MFr Bld 07/04/2021 6.3  4.6 - 6.5 % Final   Glycemic Control Guidelines for People with Diabetes:Non Diabetic:  <6%Goal of Therapy: <7%Additional Action Suggested:  >8%       Allergies as of 07/10/2021       Reactions   Other    shingls shot caused atrial fib did not take 2nd shot        Medication List        Accurate as of July 10, 2021  8:45 AM. If you have any questions, ask your nurse or doctor.          atorvastatin 40 MG tablet Commonly known as: LIPITOR TAKE 1 TABLET BY MOUTH EVERY DAY   atorvastatin 80 MG tablet Commonly known as: LIPITOR TAKE 1 TABLET BY MOUTH EVERY DAY   blood glucose meter kit and supplies Kit Test blood sugar daily as directed. Dx code: E11.9   CALCIUM PO Take 1,000 tablets by mouth daily.   CINNAMON PO Take 500 mg by mouth daily.   diphenoxylate-atropine 2.5-0.025 MG tablet Commonly known as: Lomotil Take 1 tablet by mouth 4 (four) times daily as needed for diarrhea or loose stools.   docusate sodium 100 MG capsule Commonly known as: Colace Take 1 capsule (100 mg total) by mouth 2 (two) times daily.   Erleada 60 MG tablet Generic drug: apalutamide Take 240 mg by mouth daily.   furosemide 20 MG tablet  Commonly known as: LASIX Take 1 tablet by mouth daily as needed   glimepiride 1 MG tablet Commonly known as: AMARYL TAKE 1 TABLET BY MOUTH EVERY DAY WITH BREAKFAST   glucose blood test strip Commonly known as: Accu-Chek Guide Use as instructed to check blood sugar twice daily.   leuprolide 11.25 MG injection Commonly known as: LUPRON Inject 11.25 mg into the muscle every 6 (six) months.   losartan 50 MG tablet Commonly known as: COZAAR 1 TABLET  IN THE MORNING AND HALF TABLET IN THE EVENING   metoprolol tartrate 100 MG tablet Commonly known as: LOPRESSOR TAKE 1 TABLET BY MOUTH TWICE A DAY   Ozempic (1 MG/DOSE) 4 MG/3ML Sopn Generic drug: Semaglutide (1 MG/DOSE) Inject 1 mg into the skin once a week.   pantoprazole 40 MG tablet Commonly known as: PROTONIX TAKE 1 TABLET BY MOUTH EVERY DAY   Synjardy XR 12.12-998 MG Tb24 Generic drug: Empagliflozin-metFORMIN HCl ER TAKE 2 TABLETS BY MOUTH EVERY DAY   tamsulosin 0.4 MG Caps capsule Commonly known as: FLOMAX Take 1 capsule (0.4 mg total) by mouth daily. What changed: when to take this   traMADol 50 MG tablet Commonly known as: Ultram Take 1 tablet (50 mg total) by mouth every 6 (six) hours as needed for moderate pain or severe pain. Post-operatively   VITAMIN B 12 PO Take by mouth. 1000 mg daily   Vitamin D (Cholecalciferol) 10 MCG (400 UNIT) Tabs Take 400 Units by mouth daily.   warfarin 5 MG tablet Commonly known as: COUMADIN Take as directed by the anticoagulation clinic. If you are unsure how to take this medication, talk to your nurse or doctor. Original instructions: TAKE 1-2 TABLETS DAILY OR AS PRESCRIBED BY COUMADIN CLINIC.   zinc gluconate 50 MG tablet Take 50 mg by mouth daily.        Allergies:  Allergies  Allergen Reactions   Other     shingls shot caused atrial fib did not take 2nd shot    Past Medical History:  Diagnosis Date   Arthritis    Atrial fibrillation (HCC)    Complication of anesthesia    needs to sit up  at 45 degree angle or gets afib during surgery or in recovery   Diabetes mellitus without complication ([place])    type 2   DVT (deep venous thrombosis) ([place]) 2015   left leg.behind knee   Foley catheter in place last changed 2eeks ago   last 7 months   GERD (gastroesophageal reflux disease)    Hx of radiation therapy 40 tx 2015   Hypercholesterolemia    Hypertension    Prostate cancer ([place]) 12/06/13   gleason 4+3=7, 6/12  cores positive. seed implant and radiation   Tourette's syndrome    Urinary retention    Wears glasses     Past Surgical History:  Procedure Laterality Date   APPENDECTOMY  1968   BALLOON DILATION N/A 08/02/2015   Procedure: BALLOON DILATION;  Surgeon: [name], MD;  Location: [facility];  Service: Endoscopy;  Laterality: N/A;   CARDIOVERSION N/A 05/13/2018   Procedure: CARDIOVERSION;  Surgeon: Sanda Klein, MD;  Location: [facility];  Service: Cardiovascular;  Laterality: N/A;   CARDIOVERSION N/A 05/18/2018   Procedure: CARDIOVERSION;  Surgeon: Buford Dresser, MD;  Location: [place];  Service: Cardiovascular;  Laterality: N/A;   COLONOSCOPY WITH PROPOFOL N/A 08/02/2015   Procedure: COLONOSCOPY WITH PROPOFOL w/ APC;  Surgeon: [name], MD;  Location: [facility];  Service:  Endoscopy;  Laterality: N/A;   CYSTOSCOPY N/A 10/05/2020   Procedure: CYSTOSCOPY;  Surgeon: Alexis Frock, MD;  Location: [facility];  Service: Urology;  Laterality: N/A;   ESOPHAGOGASTRODUODENOSCOPY (EGD) WITH PROPOFOL N/A 08/02/2015   Procedure: ESOPHAGOGASTRODUODENOSCOPY (EGD) WITH PROPOFOL/ possible dilation.;  Surgeon: [name], MD;  Location: [facility];  Service: Endoscopy;  Laterality: N/A;   KNEE SURGERY  2001   right knee orthoscopic   PROSTATE BIOPSY  12/06/13   Gleason 4+3=7, volume 35 gm   SPINE SURGERY  1963 and 1967   Tourette'ssyndrome spinal fluid removed    TONSILLECTOMY  as child   TRANSURETHRAL RESECTION OF PROSTATE N/A 09/30/2019   Procedure: TRANSURETHRAL RESECTION OF THE PROSTATE (TURP);  Surgeon: Alexis Frock, MD;  Location: [facility];  Service: Urology;  Laterality: N/A;  1 HR   TRANSURETHRAL RESECTION OF PROSTATE N/A 10/05/2020   Procedure: TRANSURETHRAL RESECTION OF THE PROSTATE (TURP);  Surgeon: Alexis Frock, MD;  Location: [facility];  Service: Urology;  Laterality: N/A;  1 HR    Family History   Problem Relation Age of Onset   Heart disease Mother        before age 78   Diabetes Mother    Hyperlipidemia Mother    Varicose Veins Mother    Hypertension Mother    Heart disease Father    Diabetes Father    Hyperlipidemia Father    Hypertension Father    Cancer Brother    COPD Brother    Diabetes Brother    Hyperlipidemia Brother    Hypertension Brother     Social History:  reports that he quit smoking about 8 years ago. His smoking use included cigarettes. He has a 11.50 pack-year smoking history. He has never used smokeless tobacco. He reports that he does not currently use alcohol. He reports that he does not use drugs.    Review of Systems        HYPERLIPIDEMIA: he has had significant hyperlipidemia and has been treated with Lipitor 80 mg daily Previously on Vascepa for high triglycerides but he felt that it would cause atrial fibrillation an hour after taking the medicine in the evening and he stopped this  His diet is usually low in fat  Triglycerides are still normal without any specific treatment HDL usually low but slightly better Has no history of CAD       Lab Results  Component Value Date   CHOL 158 07/04/2021   CHOL 155 08/03/2020   CHOL 170 05/18/2020   Lab Results  Component Value Date   HDL 39.00 (L) 07/04/2021   HDL 36.60 (L) 08/03/2020   HDL 37 (L) 05/18/2020   Lab Results  Component Value Date   LDLCALC 92 07/04/2021   LDLCALC 91 08/03/2020   LDLCALC 107 (H) 05/18/2020   Lab Results  Component Value Date   TRIG 134.0 07/04/2021   TRIG 137.0 08/03/2020   TRIG 144 05/18/2020   Lab Results  Component Value Date   CHOLHDL 4 07/04/2021   CHOLHDL 4 08/03/2020   CHOLHDL 4.6 05/18/2020   Lab Results  Component Value Date   LDLDIRECT 93.0 12/30/2016   LDLDIRECT 103.0 02/08/2015                HYPERTENSION: He has been treated with losartan 75 mg and low-dose metoprolol, followed by PCP and cardiologist Appears to have lower blood  pressure reading today compared to previous values  BP Readings from  Last 3 Encounters:  07/10/21 138/82  06/18/21 120/70  05/16/21 132/70    RENAL function history as follows:  Lab Results  Component Value Date   CREATININE 1.00 07/04/2021   CREATININE 0.94 06/18/2021   CREATININE 1.06 05/03/2021    Followed by cardiologist for atrial fibrillation  Taking B12 for low normal vitamin B12 level  LABS:  Lab on 07/04/2021  Component Date Value Ref Range Status   Cholesterol 07/04/2021 158  0 - 200 mg/dL Final   ATP III Classification       Desirable:  < 200 mg/dL               Borderline High:  200 - 239 mg/dL          High:  > = 240 mg/dL   Triglycerides 07/04/2021 134.0  0.0 - 149.0 mg/dL Final   Normal:  <150 mg/dLBorderline High:  150 - 199 mg/dL   HDL 07/04/2021 39.00 (A)  >39.00 mg/dL Final   VLDL 07/04/2021 26.8  0.0 - 40.0 mg/dL Final   LDL Cholesterol 07/04/2021 92  0 - 99 mg/dL Final   Total CHOL/HDL Ratio 07/04/2021 4   Final                  Men          Women1/2 Average Risk     3.4          3.3Average Risk          5.0          4.42X Average Risk          9.6          7.13X Average Risk          15.0          11.0                       NonHDL 07/04/2021 118.74   Final   NOTE:  Non-HDL goal should be 30 mg/dL higher than patient's LDL goal (i.e. LDL goal of < 70 mg/dL, would have non-HDL goal of < 100 mg/dL)   Sodium 07/04/2021 144  135 - 145 mEq/L Final   Potassium 07/04/2021 4.4  3.5 - 5.1 mEq/L Final   Chloride 07/04/2021 110  96 - 112 mEq/L Final   CO2 07/04/2021 25  19 - 32 mEq/L Final   Glucose, Bld 07/04/2021 84  70 - 99 mg/dL Final   BUN 07/04/2021 20  6 - 23 mg/dL Final   Creatinine, Ser 07/04/2021 1.00  0.40 - 1.50 mg/dL Final   GFR 07/04/2021 77.19  >60.00 mL/min Final   Calculated using the CKD-EPI Creatinine Equation (2021)   Calcium 07/04/2021 8.3 (A)  8.4 - 10.5 mg/dL Final   Hgb A1c MFr Bld 07/04/2021 6.3  4.6 - 6.5 % Final   Glycemic Control  Guidelines for People with Diabetes:Non Diabetic:  <6%Goal of Therapy: <7%Additional Action Suggested:  >8%     Physical Examination:  BP 138/82   Pulse 60   Ht '5\' 11"'  (1.803 m)   Wt 259 lb (117.5 kg)   SpO2 98%   BMI 36.12 kg/m     Diabetes type 2, obese, non-insulin-dependent   See history of present illness for detailed discussion of his current management, blood sugar patterns and problems identified   His A1c is much improved At 6.3, previously was at 7.7  Currently on Synjardy XR, Ozempic 1 mg weekly  and low-dose Amaryl  He has done well with improved blood sugar control and has benefited from Ozempic No side effects with this However he still complains of decreased appetite and since his blood sugars are mostly in the normal range may benefit from reducing the Ozempic temporarily Weight is stable recently  HYPERTENSION: Blood pressure is controlled  Lipids: LDL is below 100 and he will continue 80 mg Lipitor  History of microalbuminuria: Although if this is slightly better it is still persistent Will recheck on the next visit  Renal dysfunction: Resolved    PLAN:  He will reduce the Ozempic to half milligram Showed him how to dial to full turns of his 1 mg pen instead of the 4 turns  On his next prescription he will get the 0.5 mg dosage No change in Synjardy To continue losartan for hypertension    There are no Patient Instructions on file for this visit.   He will call if he has any consistently high blood sugars  Elayne Snare 07/10/2021, 8:45 AM   Note: This office note was prepared with Dragon voice recognition system technology. Any transcriptional errors that result from this process are unintentional.

## 2021-07-10 ENCOUNTER — Ambulatory Visit: Payer: BC Managed Care – PPO | Admitting: Endocrinology

## 2021-07-10 ENCOUNTER — Other Ambulatory Visit: Payer: Self-pay

## 2021-07-10 ENCOUNTER — Encounter: Payer: Self-pay | Admitting: Endocrinology

## 2021-07-10 VITALS — BP 138/82 | HR 60 | Ht 71.0 in | Wt 259.0 lb

## 2021-07-10 DIAGNOSIS — E669 Obesity, unspecified: Secondary | ICD-10-CM

## 2021-07-10 DIAGNOSIS — C61 Malignant neoplasm of prostate: Secondary | ICD-10-CM

## 2021-07-10 DIAGNOSIS — E782 Mixed hyperlipidemia: Secondary | ICD-10-CM | POA: Diagnosis not present

## 2021-07-10 DIAGNOSIS — I10 Essential (primary) hypertension: Secondary | ICD-10-CM

## 2021-07-10 DIAGNOSIS — E1169 Type 2 diabetes mellitus with other specified complication: Secondary | ICD-10-CM | POA: Diagnosis not present

## 2021-07-10 MED ORDER — OZEMPIC (0.25 OR 0.5 MG/DOSE) 2 MG/1.5ML ~~LOC~~ SOPN: 0.5000 mg | PEN_INJECTOR | SUBCUTANEOUS | 2 refills | Status: AC

## 2021-07-10 NOTE — Patient Instructions (Addendum)
Take 1/2 dose Ozempic  Check blood sugars on waking up 2-3 days a week  Also check blood sugars about 2 hours after meals and do this after different meals by rotation  Recommended blood sugar levels on waking up are 90-130 and about 2 hours after meal is 130-160  Please bring your blood sugar monitor to each visit, thank you

## 2021-07-11 ENCOUNTER — Ambulatory Visit (INDEPENDENT_AMBULATORY_CARE_PROVIDER_SITE_OTHER): Payer: BC Managed Care – PPO

## 2021-07-11 ENCOUNTER — Telehealth: Payer: Self-pay | Admitting: *Deleted

## 2021-07-11 ENCOUNTER — Other Ambulatory Visit: Payer: Self-pay

## 2021-07-11 DIAGNOSIS — Z7901 Long term (current) use of anticoagulants: Secondary | ICD-10-CM | POA: Diagnosis not present

## 2021-07-11 DIAGNOSIS — I4819 Other persistent atrial fibrillation: Secondary | ICD-10-CM | POA: Diagnosis not present

## 2021-07-11 LAB — POCT INR: INR: 1.4 — AB (ref 2.0–3.0)

## 2021-07-11 NOTE — Patient Instructions (Signed)
Take 2 tablets today and Friday only and then increase to 1.5 tablets daily.  Recheck INR in 2 weeks. Coumadin Clinic [phone]

## 2021-07-11 NOTE — Telephone Encounter (Signed)
CALLED PATIENT TO REMIND OF LAB AND WEIGHT FOR 07-12-21 @ 12 PM @ [place], SPOKE WITH PATIENT AND HE IS AWARE OF THESE APPTS.

## 2021-07-12 ENCOUNTER — Ambulatory Visit
Admission: RE | Admit: 2021-07-12 | Discharge: 2021-07-12 | Disposition: A | Discharge status: Home / Self Care | Payer: BC Managed Care – PPO | Source: Ambulatory Visit | Attending: Radiation Oncology | Admitting: Radiation Oncology

## 2021-07-12 DIAGNOSIS — C61 Malignant neoplasm of prostate: Secondary | ICD-10-CM | POA: Diagnosis not present

## 2021-07-12 LAB — CBC WITH DIFFERENTIAL (CANCER CENTER ONLY)
Abs Immature Granulocytes: 0.02 10*3/uL (ref 0.00–0.07)
Basophils Absolute: 0 10*3/uL (ref 0.0–0.1)
Basophils Relative: 1 %
Eosinophils Absolute: 0.1 10*3/uL (ref 0.0–0.5)
Eosinophils Relative: 2 %
HCT: 34.5 % — ABNORMAL LOW (ref 39.0–52.0)
Hemoglobin: 10.8 g/dL — ABNORMAL LOW (ref 13.0–17.0)
Immature Granulocytes: 1 %
Lymphocytes Relative: 19 %
Lymphs Abs: 0.8 10*3/uL (ref 0.7–4.0)
MCH: 25.5 pg — ABNORMAL LOW (ref 26.0–34.0)
MCHC: 31.3 g/dL (ref 30.0–36.0)
MCV: 81.6 fL (ref 80.0–100.0)
Monocytes Absolute: 0.6 10*3/uL (ref 0.1–1.0)
Monocytes Relative: 14 %
Neutro Abs: 2.7 10*3/uL (ref 1.7–7.7)
Neutrophils Relative %: 63 %
Platelet Count: 181 10*3/uL (ref 150–400)
RBC: 4.23 MIL/uL (ref 4.22–5.81)
RDW: 23.8 % — ABNORMAL HIGH (ref 11.5–15.5)
WBC Count: 4.2 10*3/uL (ref 4.0–10.5)
nRBC: 0 % (ref 0.0–0.2)

## 2021-07-15 ENCOUNTER — Other Ambulatory Visit: Payer: Self-pay | Admitting: Nurse Practitioner

## 2021-07-16 ENCOUNTER — Telehealth: Payer: Self-pay | Admitting: *Deleted

## 2021-07-16 NOTE — Telephone Encounter (Signed)
CALLED PATIENT TO INFORM THAT HIS INJ. HAS BEEN MOVED TO 07-19-21- ARRIVAL TIME- 8:15 AM @ [facility], SPOKE WITH PATIENT AND HE IS AWARE THIS APPT.

## 2021-07-18 ENCOUNTER — Telehealth: Payer: Self-pay | Admitting: *Deleted

## 2021-07-18 NOTE — Telephone Encounter (Signed)
CALLED PATIENT TO REMIND OF XOFIGO INJ. FOR 07-19-21- ARRIVAL TIME- 8:15 AM @ [facility], SPOKE WITH PATIENT AND HE IS AWARE OF THIS APPT.

## 2021-07-19 ENCOUNTER — Other Ambulatory Visit: Payer: Self-pay

## 2021-07-19 ENCOUNTER — Ambulatory Visit (HOSPITAL_COMMUNITY)
Admission: RE | Admit: 2021-07-19 | Discharge: 2021-07-19 | Disposition: A | Discharge status: Home / Self Care | Payer: BC Managed Care – PPO | Source: Ambulatory Visit | Attending: Radiation Oncology | Admitting: Radiation Oncology

## 2021-07-19 DIAGNOSIS — C61 Malignant neoplasm of prostate: Secondary | ICD-10-CM | POA: Diagnosis not present

## 2021-07-19 DIAGNOSIS — R52 Pain, unspecified: Secondary | ICD-10-CM | POA: Insufficient documentation

## 2021-07-19 DIAGNOSIS — M7989 Other specified soft tissue disorders: Secondary | ICD-10-CM | POA: Diagnosis not present

## 2021-07-19 DIAGNOSIS — C7951 Secondary malignant neoplasm of bone: Secondary | ICD-10-CM | POA: Insufficient documentation

## 2021-07-19 DIAGNOSIS — M79602 Pain in left arm: Secondary | ICD-10-CM | POA: Diagnosis not present

## 2021-07-19 MED ORDER — RADIUM RA 223 DICHLORIDE 30 MCCI/ML IV SOLN
180.0000 | Freq: Once | INTRAVENOUS | Status: AC | PRN
  Administered 2021-07-19: 180 via INTRAVENOUS

## 2021-07-19 NOTE — Addendum Note (Signed)
Encounter addended by: Tyler Pita, MD on: 07/19/2021 8:37 AM  Actions taken: Visit diagnoses modified, Clinical Note Signed

## 2021-07-19 NOTE — Progress Notes (Signed)
  Radiation Oncology         [phone] ________________________________  Name: TYREASE VANDEBERG MRN: [id number]  Date: 07/19/2021  DOB: 03-21-1951  Radium-223 Infusion Note  Diagnosis:  Castration resistant prostate cancer with painful bone involvement  Current Infusion:    3  Planned Infusions:  6  Narrative: Mr. AGAM DAVENPORT presented to nuclear medicine for treatment. His most recent blood counts were reviewed.  He remains a good candidate to proceed with Ra-223.  The patient was situated in an infusion suite with a contact barrier placed under his arm. Intravenous access was established, using sterile technique, and a normal saline infusion from a syringe was started.  Micro-dosimetry:  The prescribed radiation activity was assayed and confirmed to be within specified tolerance.  Special Treatment Procedure - Infusion:  The nuclear medicine technologist and I personally verified the dose activity to be delivered as specified in the written directive, and verified the patient identification via 2 separate methods.  The syringe containing the dose was attached to an intravenous access and the dose delivered over a minute. No complications were noted.  The total administered dose was 183.3 microcuries.   A saline flush of the line and the syringe that contained the isotope was then performed.  The residual radioactivity in the syringe was 3.3 microcuries, so the actual infused isotope activity was 180.0 microcuries.   Pressure was applied to the venipuncture site, and a compression bandage placed.   Radiation Safety personnel were present to perform the discharge survey, as detailed on their documentation.   After a short period of observation, the patient had his IV removed.  Impression:  The patient tolerated his infusion relatively well.  Plan:  The patient will return in one month for ongoing care.    ________________________________  Sheral Apley. Tammi Klippel, M.D.

## 2021-07-19 NOTE — Addendum Note (Signed)
Encounter addended by: Rondel Baton, RT on: 07/19/2021 9:58 AM  Actions taken: Imaging Exam ended, Charge Capture section accepted

## 2021-07-19 NOTE — Progress Notes (Signed)
  Radiation Oncology         [phone] ________________________________  Name: Tommy Yang MRN: [id number]  Date: 06/18/2021  DOB: Jan 06, 1951  R[facility] Infusion Note  Diagnosis:  Castration resistant prostate cancer with painful bone involvement  Current Infusion:    2  Planned Infusions:  6  Narrative: Tommy Yang presented to nuclear medicine for treatment. His most recent blood counts were reviewed.  He remains a good candidate to proceed with Ra-223.  The patient was situated in an infusion suite with a contact barrier placed under his arm. Intravenous access was established, using sterile technique, and a normal saline infusion from a syringe was started.  Micro-dosimetry:  The prescribed radiation activity was assayed and confirmed to be within specified tolerance.  Special Treatment Procedure - Infusion:  The nuclear medicine technologist and I personally verified the dose activity to be delivered as specified in the written directive, and verified the patient identification via 2 separate methods.  The syringe containing the dose was attached to an intravenous access and the dose delivered over a minute. No complications were noted.  The total administered dose was 169.8 microcuries.   A saline flush of the line and the syringe that contained the isotope was then performed.  The residual radioactivity in the syringe was 3.0 microcuries, so the actual infused isotope activity was 166.8 microcuries.   Pressure was applied to the venipuncture site, and a compression bandage placed.   Radiation Safety personnel were present to perform the discharge survey, as detailed on their documentation.   After a short period of observation, the patient had his IV removed.  Impression:  The patient tolerated his infusion relatively well.  Plan:  The patient will return in one month for ongoing care.    ________________________________  Sheral Apley. Tammi Klippel, M.D.

## 2021-07-19 NOTE — Addendum Note (Signed)
Encounter addended by: Rondel Baton, RT on: 07/19/2021 9:57 AM  Actions taken: Imaging Exam begun, Order list changed, MAR administration accepted

## 2021-07-21 ENCOUNTER — Telehealth: Payer: Self-pay

## 2021-07-21 NOTE — Progress Notes (Signed)
Please call patient with normal result.  Thanks. MM 

## 2021-07-21 NOTE — Telephone Encounter (Signed)
Called to make patient aware of normal results of humerus xray per. Dr. Tammi Klippel.

## 2021-07-23 ENCOUNTER — Other Ambulatory Visit: Payer: Self-pay

## 2021-07-23 ENCOUNTER — Ambulatory Visit (INDEPENDENT_AMBULATORY_CARE_PROVIDER_SITE_OTHER): Payer: BC Managed Care – PPO | Admitting: Pharmacist

## 2021-07-23 ENCOUNTER — Ambulatory Visit: Payer: BC Managed Care – PPO | Admitting: Internal Medicine

## 2021-07-23 DIAGNOSIS — I4819 Other persistent atrial fibrillation: Secondary | ICD-10-CM | POA: Diagnosis not present

## 2021-07-23 DIAGNOSIS — Z7901 Long term (current) use of anticoagulants: Secondary | ICD-10-CM

## 2021-07-23 DIAGNOSIS — I82512 Chronic embolism and thrombosis of left femoral vein: Secondary | ICD-10-CM

## 2021-07-23 LAB — POCT INR: INR: 4.3 — AB (ref 2.0–3.0)

## 2021-07-23 NOTE — Patient Instructions (Signed)
Description   Hold warfarin today then continue 1.5 tablets daily.  Recheck INR in 2 weeks. Coumadin Clinic [phone]

## 2021-07-24 ENCOUNTER — Telehealth: Payer: Self-pay | Admitting: *Deleted

## 2021-07-24 ENCOUNTER — Ambulatory Visit (HOSPITAL_COMMUNITY): Payer: BC Managed Care – PPO

## 2021-07-24 ENCOUNTER — Other Ambulatory Visit (HOSPITAL_COMMUNITY): Payer: Self-pay | Admitting: Radiation Oncology

## 2021-07-24 DIAGNOSIS — C61 Malignant neoplasm of prostate: Secondary | ICD-10-CM

## 2021-07-24 DIAGNOSIS — C7951 Secondary malignant neoplasm of bone: Secondary | ICD-10-CM

## 2021-07-24 NOTE — Telephone Encounter (Signed)
CALELD PATIENT TO INFORM OF LAB AND WEIGHT FOR 08-23-21 @ 8:30 AM @ [place]. ON 08-30-21 @ 8:30 AM @ [facility], SPOKE WITH PATIENT AND HE IS AWARE OF THESE APPTS.

## 2021-08-01 DIAGNOSIS — C61 Malignant neoplasm of prostate: Secondary | ICD-10-CM | POA: Diagnosis not present

## 2021-08-01 DIAGNOSIS — R338 Other retention of urine: Secondary | ICD-10-CM | POA: Diagnosis not present

## 2021-08-01 DIAGNOSIS — C7951 Secondary malignant neoplasm of bone: Secondary | ICD-10-CM | POA: Diagnosis not present

## 2021-08-01 DIAGNOSIS — N130 Hydronephrosis with ureteropelvic junction obstruction: Secondary | ICD-10-CM | POA: Diagnosis not present

## 2021-08-01 DIAGNOSIS — C778 Secondary and unspecified malignant neoplasm of lymph nodes of multiple regions: Secondary | ICD-10-CM | POA: Diagnosis not present

## 2021-08-02 ENCOUNTER — Other Ambulatory Visit: Payer: Self-pay | Admitting: Internal Medicine

## 2021-08-02 ENCOUNTER — Other Ambulatory Visit (HOSPITAL_COMMUNITY): Payer: Self-pay | Admitting: Nurse Practitioner

## 2021-08-02 DIAGNOSIS — I10 Essential (primary) hypertension: Secondary | ICD-10-CM

## 2021-08-06 ENCOUNTER — Ambulatory Visit (INDEPENDENT_AMBULATORY_CARE_PROVIDER_SITE_OTHER): Payer: BC Managed Care – PPO | Admitting: *Deleted

## 2021-08-06 ENCOUNTER — Other Ambulatory Visit: Payer: Self-pay

## 2021-08-06 DIAGNOSIS — Z5181 Encounter for therapeutic drug level monitoring: Secondary | ICD-10-CM

## 2021-08-06 LAB — POCT INR: INR: 2.6 (ref 2.0–3.0)

## 2021-08-06 NOTE — Patient Instructions (Signed)
Description   Continue 1.5 tablets daily.  Recheck INR in  3 weeks. Coumadin Clinic [phone]

## 2021-08-07 ENCOUNTER — Other Ambulatory Visit: Payer: Self-pay

## 2021-08-07 ENCOUNTER — Telehealth: Payer: Self-pay | Admitting: Internal Medicine

## 2021-08-07 DIAGNOSIS — C61 Malignant neoplasm of prostate: Secondary | ICD-10-CM

## 2021-08-07 NOTE — Telephone Encounter (Signed)
Patient requesting refill for diarrhea medication  Patient does not know the name of the medication, patient states is is still having diarrhea  Patient was last seen on 10-18 for diarrhea  Please advise

## 2021-08-07 NOTE — Telephone Encounter (Signed)
Notified patient that prescription was sent 08/02/21

## 2021-08-21 ENCOUNTER — Telehealth: Payer: Self-pay | Admitting: *Deleted

## 2021-08-21 NOTE — Telephone Encounter (Signed)
CALLED PATIENT TO REMIND OF LAB AND WEIGHT APPT. FOR 08-23-21 @ 8:30 AM @ [facility], SPOKE WITH PATIENT AND HE IS AWARE OF THIS APPT.

## 2021-08-23 ENCOUNTER — Other Ambulatory Visit: Payer: Self-pay

## 2021-08-23 ENCOUNTER — Ambulatory Visit
Admission: RE | Admit: 2021-08-23 | Discharge: 2021-08-23 | Disposition: A | Discharge status: Home / Self Care | Payer: BC Managed Care – PPO | Source: Ambulatory Visit | Attending: Radiation Oncology | Admitting: Radiation Oncology

## 2021-08-23 DIAGNOSIS — C61 Malignant neoplasm of prostate: Secondary | ICD-10-CM | POA: Insufficient documentation

## 2021-08-23 DIAGNOSIS — C7951 Secondary malignant neoplasm of bone: Secondary | ICD-10-CM | POA: Diagnosis not present

## 2021-08-23 LAB — CBC WITH DIFFERENTIAL (CANCER CENTER ONLY)
Abs Immature Granulocytes: 0.04 10*3/uL (ref 0.00–0.07)
Basophils Absolute: 0 10*3/uL (ref 0.0–0.1)
Basophils Relative: 0 %
Eosinophils Absolute: 0 10*3/uL (ref 0.0–0.5)
Eosinophils Relative: 0 %
HCT: 32.4 % — ABNORMAL LOW (ref 39.0–52.0)
Hemoglobin: 10.2 g/dL — ABNORMAL LOW (ref 13.0–17.0)
Immature Granulocytes: 1 %
Lymphocytes Relative: 13 %
Lymphs Abs: 0.9 10*3/uL (ref 0.7–4.0)
MCH: 26 pg (ref 26.0–34.0)
MCHC: 31.5 g/dL (ref 30.0–36.0)
MCV: 82.7 fL (ref 80.0–100.0)
Monocytes Absolute: 0.7 10*3/uL (ref 0.1–1.0)
Monocytes Relative: 11 %
Neutro Abs: 5.2 10*3/uL (ref 1.7–7.7)
Neutrophils Relative %: 75 %
Platelet Count: 264 10*3/uL (ref 150–400)
RBC: 3.92 MIL/uL — ABNORMAL LOW (ref 4.22–5.81)
RDW: 21.1 % — ABNORMAL HIGH (ref 11.5–15.5)
WBC Count: 6.9 10*3/uL (ref 4.0–10.5)
nRBC: 0 % (ref 0.0–0.2)

## 2021-08-28 ENCOUNTER — Other Ambulatory Visit: Payer: Self-pay

## 2021-08-28 ENCOUNTER — Ambulatory Visit (INDEPENDENT_AMBULATORY_CARE_PROVIDER_SITE_OTHER): Payer: BC Managed Care – PPO

## 2021-08-28 DIAGNOSIS — I4819 Other persistent atrial fibrillation: Secondary | ICD-10-CM | POA: Diagnosis not present

## 2021-08-28 DIAGNOSIS — I82512 Chronic embolism and thrombosis of left femoral vein: Secondary | ICD-10-CM

## 2021-08-28 DIAGNOSIS — Z7901 Long term (current) use of anticoagulants: Secondary | ICD-10-CM | POA: Diagnosis not present

## 2021-08-28 LAB — POCT INR: INR: 8 — AB (ref 2.0–3.0)

## 2021-08-28 NOTE — Patient Instructions (Signed)
Description   Hold Warfarin today, Thursday, and Fridays. Eat greens. Recheck INR Friday 08/30/21 Normal dose: 1.5 tablets daily.  Coumadin Clinic [phone].  If any bleeding issues occur, go to nearest Emergency Department.

## 2021-08-30 ENCOUNTER — Inpatient Hospital Stay (HOSPITAL_COMMUNITY): Admission: RE | Admit: 2021-08-30 | Payer: BC Managed Care – PPO | Source: Ambulatory Visit

## 2021-08-30 ENCOUNTER — Ambulatory Visit (HOSPITAL_COMMUNITY): Payer: BC Managed Care – PPO

## 2021-08-30 ENCOUNTER — Other Ambulatory Visit (HOSPITAL_COMMUNITY): Payer: Self-pay

## 2021-08-30 ENCOUNTER — Ambulatory Visit (INDEPENDENT_AMBULATORY_CARE_PROVIDER_SITE_OTHER): Payer: BC Managed Care – PPO | Admitting: Pharmacist Clinician (PhC)/ Clinical Pharmacy Specialist

## 2021-08-30 ENCOUNTER — Telehealth: Payer: Self-pay | Admitting: Pharmacist Clinician (PhC)/ Clinical Pharmacy Specialist

## 2021-08-30 ENCOUNTER — Other Ambulatory Visit: Payer: Self-pay

## 2021-08-30 DIAGNOSIS — I4819 Other persistent atrial fibrillation: Secondary | ICD-10-CM

## 2021-08-30 DIAGNOSIS — I82512 Chronic embolism and thrombosis of left femoral vein: Secondary | ICD-10-CM

## 2021-08-30 DIAGNOSIS — Z7901 Long term (current) use of anticoagulants: Secondary | ICD-10-CM

## 2021-08-30 LAB — PROTIME-INR
INR: >10 (ref 0.9–1.2)
Prothrombin Time: 113.1 s — ABNORMAL HIGH (ref 9.1–12.0)

## 2021-08-30 LAB — POCT INR: INR: 8 — AB (ref 2.0–3.0)

## 2021-08-30 MED ORDER — PHYTONADIONE 5 MG PO TABS
5.0000 mg | ORAL_TABLET | Freq: Once | ORAL | 0 refills | Status: AC
  Filled 2021-08-30: qty 1, 1d supply, fill #0

## 2021-08-30 NOTE — Telephone Encounter (Signed)
A user error has taken place: encounter opened in error, closed for administrative reasons.

## 2021-08-30 NOTE — Addendum Note (Signed)
Addended by: Rockne Menghini on: 08/30/2021 01:06 PM   Modules accepted: Orders

## 2021-09-03 ENCOUNTER — Other Ambulatory Visit: Payer: Self-pay

## 2021-09-03 ENCOUNTER — Ambulatory Visit (INDEPENDENT_AMBULATORY_CARE_PROVIDER_SITE_OTHER): Payer: BC Managed Care – PPO | Admitting: Pharmacist Clinician (PhC)/ Clinical Pharmacy Specialist

## 2021-09-03 ENCOUNTER — Ambulatory Visit: Payer: BC Managed Care – PPO | Admitting: Internal Medicine

## 2021-09-03 DIAGNOSIS — Z7901 Long term (current) use of anticoagulants: Secondary | ICD-10-CM

## 2021-09-03 DIAGNOSIS — I4819 Other persistent atrial fibrillation: Secondary | ICD-10-CM

## 2021-09-03 LAB — POCT INR: INR: 1.2 — AB (ref 2.0–3.0)

## 2021-09-04 NOTE — Progress Notes (Signed)
°  Radiation Oncology         [phone] ________________________________  Name: MATTIS FEATHERLY MRN: [id number]  Date: 09/17/2021  DOB: 1950/09/07  Radium-223 Infusion Note  Diagnosis:  Castration resistant prostate cancer with painful bone involvement  Current Infusion:    4  Planned Infusions:  6  Narrative: Mr. HARUO STEPANEK presented to nuclear medicine for treatment. His most recent blood counts were reviewed.  He remains a good candidate to proceed with Ra-223.  The patient was situated in an infusion suite with a contact barrier placed under his arm. Intravenous access was established, using sterile technique, and a normal saline infusion from a syringe was started.  Micro-dosimetry:  The prescribed radiation activity was assayed and confirmed to be within specified tolerance.  Special Treatment Procedure - Infusion:  The nuclear medicine technologist and I personally verified the dose activity to be delivered as specified in the written directive, and verified the patient identification via 2 separate methods.  The syringe containing the dose was attached to an intravenous access and the dose delivered over a minute. No complications were noted.  The total administered dose was 162.4 microcuries.   A saline flush of the line and the syringe that contained the isotope was then performed.  The residual radioactivity in the syringe was 4.7 microcuries, so the actual infused isotope activity was 157.7 microcuries.   Pressure was applied to the venipuncture site, and a compression bandage placed.   Radiation Safety personnel were present to perform the discharge survey, as detailed on their documentation.   After a short period of observation, the patient had his IV removed.  Impression:  The patient tolerated his infusion relatively well.  Plan:  The patient will return in one month for ongoing care.    ________________________________  Sheral Apley. Tammi Klippel, M.D.

## 2021-09-06 ENCOUNTER — Inpatient Hospital Stay (HOSPITAL_COMMUNITY)
Admission: EM | Admit: 2021-09-06 | Discharge: 2021-09-12 | DRG: 309 | Disposition: A | Discharge status: Home / Self Care | Payer: BC Managed Care – PPO | Attending: Internal Medicine | Admitting: Internal Medicine

## 2021-09-06 ENCOUNTER — Emergency Department (HOSPITAL_COMMUNITY): Payer: BC Managed Care – PPO

## 2021-09-06 ENCOUNTER — Encounter (HOSPITAL_COMMUNITY): Payer: Self-pay

## 2021-09-06 ENCOUNTER — Other Ambulatory Visit: Payer: Self-pay

## 2021-09-06 DIAGNOSIS — Z79899 Other long term (current) drug therapy: Secondary | ICD-10-CM | POA: Diagnosis not present

## 2021-09-06 DIAGNOSIS — Z8249 Family history of ischemic heart disease and other diseases of the circulatory system: Secondary | ICD-10-CM | POA: Diagnosis not present

## 2021-09-06 DIAGNOSIS — I10 Essential (primary) hypertension: Secondary | ICD-10-CM | POA: Diagnosis not present

## 2021-09-06 DIAGNOSIS — R609 Edema, unspecified: Secondary | ICD-10-CM | POA: Diagnosis present

## 2021-09-06 DIAGNOSIS — Z87891 Personal history of nicotine dependence: Secondary | ICD-10-CM | POA: Diagnosis not present

## 2021-09-06 DIAGNOSIS — Z833 Family history of diabetes mellitus: Secondary | ICD-10-CM

## 2021-09-06 DIAGNOSIS — Z809 Family history of malignant neoplasm, unspecified: Secondary | ICD-10-CM

## 2021-09-06 DIAGNOSIS — Z978 Presence of other specified devices: Secondary | ICD-10-CM | POA: Diagnosis not present

## 2021-09-06 DIAGNOSIS — R0609 Other forms of dyspnea: Secondary | ICD-10-CM | POA: Diagnosis not present

## 2021-09-06 DIAGNOSIS — Z86718 Personal history of other venous thrombosis and embolism: Secondary | ICD-10-CM | POA: Diagnosis not present

## 2021-09-06 DIAGNOSIS — I4891 Unspecified atrial fibrillation: Secondary | ICD-10-CM | POA: Diagnosis present

## 2021-09-06 DIAGNOSIS — Z79818 Long term (current) use of other agents affecting estrogen receptors and estrogen levels: Secondary | ICD-10-CM

## 2021-09-06 DIAGNOSIS — Z923 Personal history of irradiation: Secondary | ICD-10-CM

## 2021-09-06 DIAGNOSIS — Z9079 Acquired absence of other genital organ(s): Secondary | ICD-10-CM

## 2021-09-06 DIAGNOSIS — I4821 Permanent atrial fibrillation: Secondary | ICD-10-CM | POA: Diagnosis not present

## 2021-09-06 DIAGNOSIS — Z20822 Contact with and (suspected) exposure to covid-19: Secondary | ICD-10-CM | POA: Diagnosis present

## 2021-09-06 DIAGNOSIS — F952 Tourette's disorder: Secondary | ICD-10-CM | POA: Diagnosis present

## 2021-09-06 DIAGNOSIS — Z7984 Long term (current) use of oral hypoglycemic drugs: Secondary | ICD-10-CM

## 2021-09-06 DIAGNOSIS — R918 Other nonspecific abnormal finding of lung field: Secondary | ICD-10-CM | POA: Diagnosis not present

## 2021-09-06 DIAGNOSIS — C7951 Secondary malignant neoplasm of bone: Secondary | ICD-10-CM | POA: Diagnosis present

## 2021-09-06 DIAGNOSIS — D638 Anemia in other chronic diseases classified elsewhere: Secondary | ICD-10-CM | POA: Diagnosis present

## 2021-09-06 DIAGNOSIS — I4811 Longstanding persistent atrial fibrillation: Secondary | ICD-10-CM | POA: Diagnosis not present

## 2021-09-06 DIAGNOSIS — J984 Other disorders of lung: Secondary | ICD-10-CM | POA: Diagnosis not present

## 2021-09-06 DIAGNOSIS — K219 Gastro-esophageal reflux disease without esophagitis: Secondary | ICD-10-CM | POA: Diagnosis present

## 2021-09-06 DIAGNOSIS — E7800 Pure hypercholesterolemia, unspecified: Secondary | ICD-10-CM | POA: Diagnosis present

## 2021-09-06 DIAGNOSIS — I4819 Other persistent atrial fibrillation: Secondary | ICD-10-CM | POA: Diagnosis present

## 2021-09-06 DIAGNOSIS — E119 Type 2 diabetes mellitus without complications: Secondary | ICD-10-CM | POA: Diagnosis present

## 2021-09-06 DIAGNOSIS — C61 Malignant neoplasm of prostate: Secondary | ICD-10-CM | POA: Diagnosis not present

## 2021-09-06 DIAGNOSIS — I700 Atherosclerosis of aorta: Secondary | ICD-10-CM | POA: Diagnosis not present

## 2021-09-06 DIAGNOSIS — Z7901 Long term (current) use of anticoagulants: Secondary | ICD-10-CM | POA: Diagnosis not present

## 2021-09-06 DIAGNOSIS — R55 Syncope and collapse: Secondary | ICD-10-CM | POA: Diagnosis not present

## 2021-09-06 DIAGNOSIS — E669 Obesity, unspecified: Secondary | ICD-10-CM | POA: Diagnosis present

## 2021-09-06 DIAGNOSIS — D630 Anemia in neoplastic disease: Secondary | ICD-10-CM | POA: Diagnosis not present

## 2021-09-06 DIAGNOSIS — N139 Obstructive and reflux uropathy, unspecified: Secondary | ICD-10-CM | POA: Diagnosis present

## 2021-09-06 DIAGNOSIS — I951 Orthostatic hypotension: Secondary | ICD-10-CM

## 2021-09-06 DIAGNOSIS — R338 Other retention of urine: Secondary | ICD-10-CM | POA: Diagnosis not present

## 2021-09-06 DIAGNOSIS — D649 Anemia, unspecified: Secondary | ICD-10-CM | POA: Diagnosis present

## 2021-09-06 DIAGNOSIS — E1169 Type 2 diabetes mellitus with other specified complication: Secondary | ICD-10-CM | POA: Diagnosis not present

## 2021-09-06 DIAGNOSIS — I2510 Atherosclerotic heart disease of native coronary artery without angina pectoris: Secondary | ICD-10-CM | POA: Diagnosis not present

## 2021-09-06 DIAGNOSIS — R0602 Shortness of breath: Secondary | ICD-10-CM | POA: Diagnosis not present

## 2021-09-06 LAB — COMPREHENSIVE METABOLIC PANEL
ALT: 14 U/L (ref 0–44)
AST: 20 U/L (ref 15–41)
Albumin: 2.5 g/dL — ABNORMAL LOW (ref 3.5–5.0)
Alkaline Phosphatase: 83 U/L (ref 38–126)
Anion gap: 13 (ref 5–15)
BUN: 26 mg/dL — ABNORMAL HIGH (ref 8–23)
CO2: 21 mmol/L — ABNORMAL LOW (ref 22–32)
Calcium: 8.3 mg/dL — ABNORMAL LOW (ref 8.9–10.3)
Chloride: 103 mmol/L (ref 98–111)
Creatinine, Ser: 1.17 mg/dL (ref 0.61–1.24)
GFR, Estimated: >60 mL/min (ref >60)
Glucose, Bld: 164 mg/dL — ABNORMAL HIGH (ref 70–99)
Potassium: 4 mmol/L (ref 3.5–5.1)
Sodium: 137 mmol/L (ref 135–145)
Total Bilirubin: 1 mg/dL (ref 0.3–1.2)
Total Protein: 6.9 g/dL (ref 6.5–8.1)

## 2021-09-06 LAB — CBC WITH DIFFERENTIAL/PLATELET
Abs Immature Granulocytes: 0.08 10*3/uL — ABNORMAL HIGH (ref 0.00–0.07)
Basophils Absolute: 0 10*3/uL (ref 0.0–0.1)
Basophils Relative: 0 %
Eosinophils Absolute: 0 10*3/uL (ref 0.0–0.5)
Eosinophils Relative: 0 %
HCT: 28.6 % — ABNORMAL LOW (ref 39.0–52.0)
Hemoglobin: 8.9 g/dL — ABNORMAL LOW (ref 13.0–17.0)
Immature Granulocytes: 1 %
Lymphocytes Relative: 12 %
Lymphs Abs: 0.9 10*3/uL (ref 0.7–4.0)
MCH: 26.1 pg (ref 26.0–34.0)
MCHC: 31.1 g/dL (ref 30.0–36.0)
MCV: 83.9 fL (ref 80.0–100.0)
Monocytes Absolute: 0.7 10*3/uL (ref 0.1–1.0)
Monocytes Relative: 9 %
Neutro Abs: 6 10*3/uL (ref 1.7–7.7)
Neutrophils Relative %: 78 %
Platelets: 264 10*3/uL (ref 150–400)
RBC: 3.41 MIL/uL — ABNORMAL LOW (ref 4.22–5.81)
RDW: 19.9 % — ABNORMAL HIGH (ref 11.5–15.5)
WBC: 7.6 10*3/uL (ref 4.0–10.5)
nRBC: 0 % (ref 0.0–0.2)

## 2021-09-06 LAB — PROTIME-INR
INR: 2.8 — ABNORMAL HIGH (ref 0.8–1.2)
Prothrombin Time: 29.8 seconds — ABNORMAL HIGH (ref 11.4–15.2)

## 2021-09-06 LAB — CBG MONITORING, ED: Glucose-Capillary: 153 mg/dL — ABNORMAL HIGH (ref 70–99)

## 2021-09-06 LAB — APTT: aPTT: 52 seconds — ABNORMAL HIGH (ref 24–36)

## 2021-09-06 LAB — BRAIN NATRIURETIC PEPTIDE: B Natriuretic Peptide: 219.2 pg/mL — ABNORMAL HIGH (ref 0.0–100.0)

## 2021-09-06 MED ORDER — IOHEXOL 350 MG/ML SOLN
80.0000 mL | Freq: Once | INTRAVENOUS | Status: AC | PRN
  Administered 2021-09-06: 80 mL via INTRAVENOUS

## 2021-09-06 NOTE — ED Provider Notes (Signed)
[facility] DEPT Provider Note   CSN: [id number] Arrival date & time: 09/06/21  1312     History  Chief Complaint  Patient presents with   Shortness of Breath   Dizziness    Tommy Yang is a 71 y.o. male.  The history is provided by the patient, the spouse and medical records.  Shortness of Breath Dizziness Associated symptoms: shortness of breath   Tommy Yang is a 71 y.o. male who presents to the Emergency Department complaining of sob.  He presents to the ED accompanied by his wife for evaluation of progressive SOB that started about three months ago when he started radium infusions.  He has severe DOE, starts as soon as he gets up.  He feels like he will pass out.    No associated CP, AP.  Has poor appetite, weight loss (70lbs), chills, diarrhea.  No hematochezia or melena.    No fevers, cough.      Home Medications Prior to Admission medications   Medication Sig Start Date End Date Taking? Authorizing Provider  atorvastatin (LIPITOR) 40 MG tablet TAKE 1 TABLET BY MOUTH EVERY DAY 04/16/21   [name], MD  atorvastatin (LIPITOR) 80 MG tablet TAKE 1 TABLET BY MOUTH EVERY DAY 06/17/21   Elayne Snare, MD  blood glucose meter kit and supplies KIT Test blood sugar daily as directed. Dx code: E11.9 01/23/15   Darlyne Russian, MD  CALCIUM PO Take 1,000 tablets by mouth daily.     [provider]  CINNAMON PO Take 500 mg by mouth daily.     [provider]  Cyanocobalamin (VITAMIN B 12 PO) Take by mouth. 1000 mg daily    [provider]  diphenoxylate-atropine (LOMOTIL) 2.5-0.025 MG tablet TAKE 1 TABLET BY MOUTH 4 (FOUR) TIMES DAILY AS NEEDED FOR DIARRHEA OR LOOSE STOOLS. 08/02/21   [name], MD  docusate sodium (COLACE) 100 MG capsule Take 1 capsule (100 mg total) by mouth 2 (two) times daily. 10/06/20 10/06/21  Delight Hoh, MD  Empagliflozin-metFORMIN HCl ER (SYNJARDY XR) 12.12-998 MG TB24 TAKE 2 TABLETS BY  MOUTH EVERY DAY 04/29/21   Elayne Snare, MD  ERLEADA 60 MG tablet Take 240 mg by mouth daily. 01/30/21   [provider]  furosemide (LASIX) 20 MG tablet Take 1 tablet by mouth daily as needed 05/16/21   [name], MD  glimepiride (AMARYL) 1 MG tablet TAKE 1 TABLET BY MOUTH EVERY DAY WITH BREAKFAST 06/20/21   Elayne Snare, MD  glucose blood (ACCU-CHEK GUIDE) test strip Use as instructed to check blood sugar twice daily. 10/29/18   Elayne Snare, MD  leuprolide (LUPRON) 11.25 MG injection Inject 11.25 mg into the muscle every 6 (six) months.    [provider]  losartan (COZAAR) 50 MG tablet 1 TABLET IN THE MORNING AND HALF TABLET IN THE EVENING 06/28/21   Elayne Snare, MD  metoprolol tartrate (LOPRESSOR) 100 MG tablet Take 1 tablet (100 mg total) by mouth 2 (two) times daily. Appointment Required For Further Refills [phone] 08/02/21   Sherran Needs, NP  pantoprazole (PROTONIX) 40 MG tablet TAKE 1 TABLET BY MOUTH EVERY DAY 01/08/21   [name], MD  Semaglutide,0.25 or 0.5MG/DOS, (OZEMPIC, 0.25 OR 0.5 MG/DOSE,) 2 MG/1.5ML SOPN Inject 0.5 mg into the skin once a week. 07/10/21   Elayne Snare, MD  tamsulosin (FLOMAX) 0.4 MG CAPS capsule Take 1 capsule (0.4 mg total) by mouth daily. Patient taking differently:  Take 0.4 mg by mouth 2 (two) times daily. 08/14/19   Shawna Clamp, MD  traMADol (ULTRAM) 50 MG tablet Take 1 tablet (50 mg total) by mouth every 6 (six) hours as needed for moderate pain or severe pain. Post-operatively 05/16/21 05/16/22  [name], MD  Vitamin D, Cholecalciferol, 400 UNITS TABS Take 400 Units by mouth daily.    [provider]  warfarin (COUMADIN) 5 MG tablet TAKE 1-2 TABLETS DAILY OR AS PRESCRIBED BY COUMADIN CLINIC. 07/15/21   Sherran Needs, NP  zinc gluconate 50 MG tablet Take 50 mg by mouth daily.    [provider]      Allergies    Other    Review of Systems   Review of Systems  Respiratory:  Positive for shortness of  breath.   Neurological:  Positive for dizziness.  All other systems reviewed and are negative.  Physical Exam Updated Vital Signs BP 124/79 (BP Location: Left Arm)    Pulse (!) 105    Temp 98 F (36.7 C) (Oral)    Resp 16    Ht _0  (1.803 m)    Wt 106.1 kg    SpO2 100%    BMI 32.64 kg/m  Physical Exam Vitals and nursing note reviewed.  Constitutional:      Appearance: He is well-developed.  HENT:     Head: Normocephalic and atraumatic.  Cardiovascular:     Rate and Rhythm: Regular rhythm. Tachycardia present.     Heart sounds: No murmur heard. Pulmonary:     Effort: Pulmonary effort is normal. No respiratory distress.     Breath sounds: Normal breath sounds.  Abdominal:     Palpations: Abdomen is soft.     Tenderness: There is no abdominal tenderness. There is no guarding or rebound.  Genitourinary:    Comments: Foley catheter in place, yellow urine in the bag Musculoskeletal:        General: No tenderness.     Comments: Trace edema to BLE  Skin:    General: Skin is warm and dry.     Coloration: Skin is pale.  Neurological:     Mental Status: He is alert and oriented to person, place, and time.  Psychiatric:        Behavior: Behavior normal.    ED Results / Procedures / Treatments   Labs (all labs ordered are listed, but only abnormal results are displayed) Labs Reviewed  COMPREHENSIVE METABOLIC PANEL - Abnormal; Notable for the following components:      Result Value   CO2 21 (*)    Glucose, Bld 164 (*)    BUN 26 (*)    Calcium 8.3 (*)    Albumin 2.5 (*)    All other components within normal limits  CBC WITH DIFFERENTIAL/PLATELET - Abnormal; Notable for the following components:   RBC 3.41 (*)    Hemoglobin 8.9 (*)    HCT 28.6 (*)    RDW 19.9 (*)    Abs Immature Granulocytes 0.08 (*)    All other components within normal limits  PROTIME-INR - Abnormal; Notable for the following components:   Prothrombin Time 29.8 (*)    INR 2.8 (*)    All other  components within normal limits  BRAIN NATRIURETIC PEPTIDE - Abnormal; Notable for the following components:   B Natriuretic Peptide 219.2 (*)    All other components within normal limits  APTT - Abnormal; Notable for the following components:   aPTT 52 (*)  All other components within normal limits  CBG MONITORING, ED - Abnormal; Notable for the following components:   Glucose-Capillary 153 (*)    All other components within normal limits    EKG None  Radiology DG Chest 2 View  Result Date: 09/06/2021 CLINICAL DATA:  Shortness of breath. EXAM: CHEST - 2 VIEW COMPARISON:  May 22, 2019. FINDINGS: The heart size and mediastinal contours are within normal limits. Left lung is clear. There appears to be interval development of right upper lobe opacity which may represent pneumonia, but neoplasm cannot be excluded. The visualized skeletal structures are unremarkable. IMPRESSION: Interval development of right upper lobe opacity which may represent pneumonia, but neoplasm cannot be excluded. CT scan of the chest is recommended for further evaluation. Electronically Signed   By: Marijo Conception M.D.   On: 09/06/2021 15:32   CT Angio Chest PE W/Cm &/Or Wo Cm  Result Date: 09/06/2021 CLINICAL DATA:  Pulmonary embolism (PE) suspected, high prob EXAM: CT ANGIOGRAPHY CHEST WITH CONTRAST TECHNIQUE: Multidetector CT imaging of the chest was performed using the standard protocol during bolus administration of intravenous contrast. Multiplanar CT image reconstructions and MIPs were obtained to evaluate the vascular anatomy. CONTRAST:  67m OMNIPAQUE IOHEXOL 350 MG/ML SOLN COMPARISON:  2017 FINDINGS: Cardiovascular: Satisfactory opacification of the pulmonary arteries to the segmental level. No evidence of pulmonary embolism. Normal heart size. No pericardial effusion. Coronary artery calcification. Thoracic aorta is normal in caliber with mild calcified plaque. Mediastinum/Nodes: No enlarged nodes.  Included thyroid is unremarkable. Esophagus is unremarkable. Lungs/Pleura: No pleural effusion or pneumothorax. Questioned right upper lobe opacity on radiograph is not identified by CT. Minimal patchy ground-glass density in both lungs. Upper Abdomen: Possible bilateral hydronephrosis. This was present on 2020 abdomen CT and is of unknown chronicity. Musculoskeletal: Osseous metastatic disease. No new compression deformity. Review of the MIP images confirms the above findings. IMPRESSION: No evidence of acute pulmonary embolism. Right upper lobe opacity on radiograph is not confirmed by CT. There is minimal patchy ground-glass density in both lungs that could reflect atelectasis or possibly early changes of a infectious/inflammatory process in the appropriate setting. Possible bilateral hydronephrosis. This was present on 2020 abdomen CT and may be chronic or recurrent. Diffuse osseous metastatic disease. Coronary artery and aortic calcification. Electronically Signed   By: PMacy MisM.D.   On: 09/06/2021 18:04    Procedures Procedures    Medications Ordered in ED Medications  iohexol (OMNIPAQUE) 350 MG/ML injection 80 mL (80 mLs Intravenous Contrast Given 09/06/21 1740)    ED Course/ Medical Decision Making/ A&P                           Medical Decision Making  Patient with history of prostate cancer currently undergoing treatment as well as permanent atrial fibrillation here for evaluation of progressive shortness of breath and dyspnea on exertion.  He is pale, generally weak and tachycardic on evaluation.  He overall appears dehydrated.  He has profound orthostasis on standing and hypoxia on exertion.  He is anemic, slightly downtrending from priors.  No reports of bleeding at home.  His INR is therapeutic.  BNP is mildly elevated.  CTA was obtained, which is negative for pulmonary embolism.  There are some possible infiltrates, but favor this is not acute infectious process as patient  symptoms have been ongoing for months.  We will treat with IV fluid hydration for dehydration.  Question some element of heart  failure, will have to watch how he responds to fluids.  Discussed with patient findings of studies and recommendation for admission and he is in agreement with treatment plan.  Medicine consulted for admission.        Final Clinical Impression(s) / ED Diagnoses Final diagnoses:  None    Rx / DC Orders ED Discharge Orders     None         Quintella Reichert, MD 09/07/21 [phone]

## 2021-09-06 NOTE — ED Provider Triage Note (Signed)
Emergency Medicine Provider Triage Evaluation Note  Tommy Yang , a 71 y.o. male  was evaluated in triage.   Complains of shortness of breath and dizziness.  The symptoms have been worsening over the past week.  He says that he has been experiencing increasingly worsening shortness of breath.  Every time he ambulates, he starts to feel dizzy and he starts to blackout.  He has to sit down to make the symptoms go away. Hx of DVT and A fib. On warfarin. Active cancer patient, prostate with mets to bone.  Review of Systems  Positive: Shortness of breath, near syncope Negative:   Physical Exam  BP (!) 115/99    Pulse 73    Temp 97.6 F (36.4 C) (Oral)    Resp 18    SpO2 100%  Gen:   Awake, no distress   Resp:  Normal effort  MSK:   Moves extremities without difficulty  Other:    Medical Decision Making  Medically screening exam initiated at 2:33 PM.  Appropriate orders placed.  ABELINO TIPPIN was informed that the remainder of the evaluation will be completed by another provider, this initial triage assessment does not replace that evaluation, and the importance of remaining in the ED until their evaluation is complete.     Adolphus Birchwood, [place] 09/06/21 1433

## 2021-09-06 NOTE — ED Triage Notes (Signed)
Patient c/o SOB x months, but worsening. Patient states when he gets up and walks he feels like he is going to pass out.  Patient has a foley cath.  Patient reports that he has bone cancer.

## 2021-09-07 ENCOUNTER — Encounter (HOSPITAL_COMMUNITY): Payer: Self-pay | Admitting: Internal Medicine

## 2021-09-07 DIAGNOSIS — I4819 Other persistent atrial fibrillation: Secondary | ICD-10-CM | POA: Diagnosis not present

## 2021-09-07 DIAGNOSIS — I4891 Unspecified atrial fibrillation: Secondary | ICD-10-CM | POA: Diagnosis present

## 2021-09-07 DIAGNOSIS — D638 Anemia in other chronic diseases classified elsewhere: Secondary | ICD-10-CM | POA: Diagnosis present

## 2021-09-07 DIAGNOSIS — E669 Obesity, unspecified: Secondary | ICD-10-CM

## 2021-09-07 DIAGNOSIS — E1169 Type 2 diabetes mellitus with other specified complication: Secondary | ICD-10-CM

## 2021-09-07 DIAGNOSIS — D649 Anemia, unspecified: Secondary | ICD-10-CM | POA: Diagnosis present

## 2021-09-07 DIAGNOSIS — Z978 Presence of other specified devices: Secondary | ICD-10-CM

## 2021-09-07 DIAGNOSIS — R55 Syncope and collapse: Secondary | ICD-10-CM

## 2021-09-07 LAB — CBC
HCT: 27.7 % — ABNORMAL LOW (ref 39.0–52.0)
HCT: 28 % — ABNORMAL LOW (ref 39.0–52.0)
Hemoglobin: 8.4 g/dL — ABNORMAL LOW (ref 13.0–17.0)
Hemoglobin: 8.6 g/dL — ABNORMAL LOW (ref 13.0–17.0)
MCH: 26.4 pg (ref 26.0–34.0)
MCH: 26.7 pg (ref 26.0–34.0)
MCHC: 30.3 g/dL (ref 30.0–36.0)
MCHC: 30.7 g/dL (ref 30.0–36.0)
MCV: 87.1 fL (ref 80.0–100.0)
MCV: 87 fL (ref 80.0–100.0)
Platelets: 228 10*3/uL (ref 150–400)
Platelets: 247 10*3/uL (ref 150–400)
RBC: 3.18 MIL/uL — ABNORMAL LOW (ref 4.22–5.81)
RBC: 3.22 MIL/uL — ABNORMAL LOW (ref 4.22–5.81)
RDW: 19.9 % — ABNORMAL HIGH (ref 11.5–15.5)
RDW: 19.9 % — ABNORMAL HIGH (ref 11.5–15.5)
WBC: 6.6 10*3/uL (ref 4.0–10.5)
WBC: 7.9 10*3/uL (ref 4.0–10.5)
nRBC: 0.3 % — ABNORMAL HIGH (ref 0.0–0.2)
nRBC: 0 % (ref 0.0–0.2)

## 2021-09-07 LAB — PROTIME-INR
INR: 3 — ABNORMAL HIGH (ref 0.8–1.2)
Prothrombin Time: 31.2 seconds — ABNORMAL HIGH (ref 11.4–15.2)

## 2021-09-07 LAB — BASIC METABOLIC PANEL
Anion gap: 13 (ref 5–15)
Anion gap: 10 (ref 5–15)
BUN: 23 mg/dL (ref 8–23)
BUN: 19 mg/dL (ref 8–23)
CO2: 22 mmol/L (ref 22–32)
CO2: 22 mmol/L (ref 22–32)
Calcium: 7.9 mg/dL — ABNORMAL LOW (ref 8.9–10.3)
Calcium: 7.9 mg/dL — ABNORMAL LOW (ref 8.9–10.3)
Chloride: 101 mmol/L (ref 98–111)
Chloride: 105 mmol/L (ref 98–111)
Creatinine, Ser: 0.92 mg/dL (ref 0.61–1.24)
Creatinine, Ser: 0.96 mg/dL (ref 0.61–1.24)
GFR, Estimated: >60 mL/min (ref >60)
GFR, Estimated: >60 mL/min (ref >60)
Glucose, Bld: 163 mg/dL — ABNORMAL HIGH (ref 70–99)
Glucose, Bld: 153 mg/dL — ABNORMAL HIGH (ref 70–99)
Potassium: 3.6 mmol/L (ref 3.5–5.1)
Potassium: 4 mmol/L (ref 3.5–5.1)
Sodium: 136 mmol/L (ref 135–145)
Sodium: 137 mmol/L (ref 135–145)

## 2021-09-07 LAB — GLUCOSE, CAPILLARY: Glucose-Capillary: 150 mg/dL — ABNORMAL HIGH (ref 70–99)

## 2021-09-07 LAB — RESP PANEL BY RT-PCR (FLU A&B, COVID) ARPGX2
Influenza A by PCR: NEGATIVE
Influenza B by PCR: NEGATIVE
SARS Coronavirus 2 by RT PCR: NEGATIVE

## 2021-09-07 LAB — HEMOGLOBIN A1C
Hgb A1c MFr Bld: 7 % — ABNORMAL HIGH (ref 4.8–5.6)
Mean Plasma Glucose: 154.2 mg/dL

## 2021-09-07 LAB — CBG MONITORING, ED
Glucose-Capillary: 150 mg/dL — ABNORMAL HIGH (ref 70–99)
Glucose-Capillary: 146 mg/dL — ABNORMAL HIGH (ref 70–99)
Glucose-Capillary: 142 mg/dL — ABNORMAL HIGH (ref 70–99)

## 2021-09-07 LAB — MAGNESIUM: Magnesium: 1.7 mg/dL (ref 1.7–2.4)

## 2021-09-07 MED ORDER — METFORMIN HCL ER 750 MG PO TB24
2000.0000 mg | ORAL_TABLET | Freq: Every day | ORAL | Status: DC
  Administered 2021-09-07 – 2021-09-12 (×6): 2000 mg via ORAL
  Filled 2021-09-07 (×7): qty 1

## 2021-09-07 MED ORDER — DIGOXIN 0.25 MG/ML IJ SOLN
0.5000 mg | Freq: Once | INTRAMUSCULAR | Status: AC
  Administered 2021-09-07: 0.5 mg via INTRAVENOUS
  Filled 2021-09-07: qty 2

## 2021-09-07 MED ORDER — DIGOXIN 0.25 MG/ML IJ SOLN
0.2500 mg | Freq: Once | INTRAMUSCULAR | Status: AC
  Administered 2021-09-07: 0.25 mg via INTRAVENOUS
  Filled 2021-09-07: qty 1

## 2021-09-07 MED ORDER — INSULIN ASPART 100 UNIT/ML IJ SOLN
0.0000 [IU] | Freq: Three times a day (TID) | INTRAMUSCULAR | Status: DC
  Administered 2021-09-07 (×3): 1 [IU] via SUBCUTANEOUS
  Filled 2021-09-07: qty 0.09

## 2021-09-07 MED ORDER — LOSARTAN POTASSIUM 25 MG PO TABS
50.0000 mg | ORAL_TABLET | Freq: Every day | ORAL | Status: DC
  Filled 2021-09-07: qty 2

## 2021-09-07 MED ORDER — METOPROLOL TARTRATE 50 MG PO TABS
100.0000 mg | ORAL_TABLET | Freq: Two times a day (BID) | ORAL | Status: DC
  Administered 2021-09-07 – 2021-09-12 (×11): 100 mg via ORAL
  Filled 2021-09-07: qty 2
  Filled 2021-09-07: qty 4
  Filled 2021-09-07 (×9): qty 2

## 2021-09-07 MED ORDER — ONDANSETRON HCL 4 MG PO TABS: 4.0000 mg | ORAL_TABLET | Freq: Four times a day (QID) | ORAL | Status: DC | PRN

## 2021-09-07 MED ORDER — SODIUM CHLORIDE 0.9 % IV BOLUS
250.0000 mL | Freq: Once | INTRAVENOUS | Status: AC
  Administered 2021-09-07: 250 mL via INTRAVENOUS

## 2021-09-07 MED ORDER — ACETAMINOPHEN 325 MG PO TABS
650.0000 mg | ORAL_TABLET | Freq: Four times a day (QID) | ORAL | Status: DC | PRN
  Administered 2021-09-09 (×2): 650 mg via ORAL
  Filled 2021-09-07 (×2): qty 2

## 2021-09-07 MED ORDER — POTASSIUM CHLORIDE CRYS ER 20 MEQ PO TBCR
40.0000 meq | EXTENDED_RELEASE_TABLET | ORAL | Status: AC
  Administered 2021-09-07 (×2): 40 meq via ORAL
  Filled 2021-09-07 (×2): qty 2

## 2021-09-07 MED ORDER — EMPAGLIFLOZIN-METFORMIN HCL ER 12.5-1000 MG PO TB24: 1.0000 | ORAL_TABLET | Freq: Two times a day (BID) | ORAL | Status: DC

## 2021-09-07 MED ORDER — WARFARIN SODIUM 3 MG PO TABS
3.0000 mg | ORAL_TABLET | Freq: Once | ORAL | Status: AC
  Administered 2021-09-07: 3 mg via ORAL
  Filled 2021-09-07: qty 1

## 2021-09-07 MED ORDER — TAMSULOSIN HCL 0.4 MG PO CAPS
0.4000 mg | ORAL_CAPSULE | Freq: Two times a day (BID) | ORAL | Status: DC
  Administered 2021-09-07 – 2021-09-12 (×11): 0.4 mg via ORAL
  Filled 2021-09-07 (×11): qty 1

## 2021-09-07 MED ORDER — MAGNESIUM SULFATE 2 GM/50ML IV SOLN
2.0000 g | Freq: Once | INTRAVENOUS | Status: AC
  Administered 2021-09-07: 2 g via INTRAVENOUS
  Filled 2021-09-07: qty 50

## 2021-09-07 MED ORDER — DEXTROSE 5 % IV SOLN: 5.0000 ug/kg/min | INTRAVENOUS | Status: DC

## 2021-09-07 MED ORDER — AMIODARONE HCL IN DEXTROSE 360-4.14 MG/200ML-% IV SOLN
60.0000 mg/h | INTRAVENOUS | Status: DC
  Administered 2021-09-08 (×2): 60 mg/h via INTRAVENOUS
  Administered 2021-09-08: 30 mg/h via INTRAVENOUS
  Administered 2021-09-09 – 2021-09-10 (×7): 60 mg/h via INTRAVENOUS
  Filled 2021-09-07 (×10): qty 200

## 2021-09-07 MED ORDER — ONDANSETRON HCL 4 MG/2ML IJ SOLN: 4.0000 mg | Freq: Four times a day (QID) | INTRAMUSCULAR | Status: DC | PRN

## 2021-09-07 MED ORDER — APALUTAMIDE 60 MG PO TABS: 240.0000 mg | ORAL_TABLET | Freq: Every day | ORAL | Status: DC

## 2021-09-07 MED ORDER — WARFARIN - PHARMACIST DOSING INPATIENT: Freq: Every day | Status: DC

## 2021-09-07 MED ORDER — ACETAMINOPHEN 650 MG RE SUPP: 650.0000 mg | Freq: Four times a day (QID) | RECTAL | Status: DC | PRN

## 2021-09-07 MED ORDER — AMIODARONE HCL IN DEXTROSE 360-4.14 MG/200ML-% IV SOLN
60.0000 mg/h | INTRAVENOUS | Status: AC
  Administered 2021-09-07: 60 mg/h via INTRAVENOUS
  Filled 2021-09-07: qty 200

## 2021-09-07 MED ORDER — LACTATED RINGERS IV SOLN: INTRAVENOUS | Status: DC

## 2021-09-07 MED ORDER — EMPAGLIFLOZIN 25 MG PO TABS
25.0000 mg | ORAL_TABLET | Freq: Every day | ORAL | Status: DC
  Administered 2021-09-08 – 2021-09-12 (×5): 25 mg via ORAL
  Filled 2021-09-07 (×6): qty 1

## 2021-09-07 MED ORDER — ATORVASTATIN CALCIUM 40 MG PO TABS
80.0000 mg | ORAL_TABLET | Freq: Every day | ORAL | Status: DC
  Administered 2021-09-07 – 2021-09-12 (×6): 80 mg via ORAL
  Filled 2021-09-07 (×6): qty 2

## 2021-09-07 MED ORDER — PANTOPRAZOLE SODIUM 40 MG PO TBEC
40.0000 mg | DELAYED_RELEASE_TABLET | Freq: Every day | ORAL | Status: DC
  Administered 2021-09-07 – 2021-09-12 (×6): 40 mg via ORAL
  Filled 2021-09-07 (×6): qty 1

## 2021-09-07 MED ORDER — WARFARIN SODIUM 5 MG PO TABS: 5.0000 mg | ORAL_TABLET | ORAL | Status: DC

## 2021-09-07 NOTE — Progress Notes (Signed)
ANTICOAGULATION CONSULT NOTE - Initial Consult  Pharmacy Consult for warfarin Indication:  history of atrial fibrillation, DVT  Allergies  Allergen Reactions   Shingrix [Zoster Vac Recomb Adjuvanted] Shortness Of Breath and Other (See Comments)    Pulse increased to 170+ and it affected the A-Fib   Other     shingls shot caused atrial fib did not take 2nd shot    Patient Measurements: Height: 5\' 11"  (180.3 cm) Weight: 106.1 kg (234 lb) IBW/kg (Calculated) : 75.3  Vital Signs: Temp: 98 F (36.7 C) (01/06 2255) Temp Source: Oral (01/06 2255) BP: 118/77 (01/07 0930) Pulse Rate: 139 (01/07 0930)  Labs: Recent Labs    09/06/21 1553 09/07/21 0500  HGB 8.9* 8.4*  HCT 28.6* 27.7*  PLT 264 228  APTT 52*  --   LABPROT 29.8* 31.2*  INR 2.8* 3.0*  CREATININE 1.17 0.92    Estimated Creatinine Clearance: 95.2 mL/min (by C-G formula based on SCr of 0.92 mg/dL).   Medical History: Past Medical History:  Diagnosis Date   Arthritis    Atrial fibrillation ([place])    Complication of anesthesia    needs to sit up  at 45 degree angle or gets afib during surgery or in recovery   Diabetes mellitus without complication ([place])    type 2   DVT (deep venous thrombosis) ([place]) 2015   left leg.behind knee   Foley catheter in place last changed 2eeks ago   last 7 months   GERD (gastroesophageal reflux disease)    Hx of radiation therapy 40 tx 2015   Hypercholesterolemia    Hypertension    Prostate cancer ([place]) 12/06/13   gleason 4+3=7, 6/12 cores positive. seed implant and radiation   Tourette's syndrome    Urinary retention    Wears glasses     Medications: Warfarin PTA. Last dose 09/06/21  The following information obtained from [facility] clinic notes: -Home dose: Warfarin 7.5 mg M,F; 5 mg all other days of the week -Dose recently reduced on 09/03/21 after a supratherapeutic INR reading of >10 on 08/30/21 while taking 7.5 mg daily  Assessment: Pt is a 57 yoM with PMH significant for  metastatic prostate cancer, reporting poor appetite and significant weight loss. Pt chronically anticoagulation with warfarin for afib and hx VTE. Pharmacy consulted to manage warfarin inpatient.  Today, 09/07/21 INR = 3 is therapeutic, on upper end of therapeutic range CBC: Hgb low but stable; Plt WNL Diet: Heart healthy/carb modified. RN reported pt ate some applesauce and a few bites of breakfast this morning No significant drug interactions  Goal of Therapy:  INR 2-3 Monitor platelets by anticoagulation protocol: Yes   Plan:  Warfarin 3 mg PO once today. Will give slightly reduced dose due to poor PO intake and INR on upper end of therapeutic range Protime/INR daily. CBC with AM labs tomorrow Monitor for signs of bleeding  Lenis Noon, PharmD 09/07/2021,9:47 AM

## 2021-09-07 NOTE — Plan of Care (Signed)

## 2021-09-07 NOTE — ED Notes (Signed)
Pt was only able to ambulate just outside the room. SpO2 ran 82-86 while ambulating

## 2021-09-07 NOTE — H&P (Signed)
History and Physical    Tommy Yang GUR:[id number] DOB: 1950/10/28 DOA: 09/06/2021  PCP: [name], MD   Patient coming from: Home  Chief Complaint: near syncope  HPI: Tommy Yang is a 71 y.o. male with medical history significant for Prostate cancer with metastasis to bone, chronic a-fib, HTN, DMT2, GERD who presents for evaluation of near syncope and shortness of breath.  He reports he has been having progressive shortness of breath over the last few months since he started having radium infusions for his metastatic prostate cancer.  He reports significant shortness of breath with exertion.  Today he would get very dizzy when he stood up and fell like he was going to pass out.  He had no loss of consciousness and did not have any head injury or trauma.  He has not had any numbness of his face or extremities.  He had no slurred speech, drooping face or confusion.  He has had a poor appetite with 70 pound weight loss in the last few months with his chemotherapy.  He also has side effect of edema in his legs with his chemotherapy as well as diarrhea from the chemotherapy.  He has not had chemotherapy in the last month and so the diarrhea has improved.  He states he does feel cold all the time but has not had any fever or chills.  He denies any blood in his stool or black tarry stool.  He states it is hard for him to eat because everything tastes bad since he has been on the chemotherapy.  He does try to drink boost a couple times a day to keep his calorie and protein intake up  ED Course: In the emergency room he has persistent atrial fibrillation.  His heart rate increases to the 130-140 range when he moves or talks a lot and will decrease to the 100-110 range when he is just resting quietly he has not had any chest pain or pressure.  Lab work shows WBC of 7600 hemoglobin 8.9 hematocrit 28.6 platelets 264,000.  BNP is 219.2 which is close to his baseline.  INR is 2.8.  Sodium 137  potassium 4.0 chloride 103 bicarb 21 creatinine 1.17 BUN 26 glucose 164 alk phos is 83 AST 20 ALT 14 albumin 2.5.  COVID swab is pending.  CT angiography of the chest is negative for PE.  Infiltrate that was noted on this x-ray is not present on the CT scan and there is mild atelectasis.  He has no signs or symptoms of pneumonia.  Hospitalist service was asked to evaluate patient and observe overnight for further management  Review of Systems:  General: Denies fever, chills, weight loss, night sweats. Reports decrease in appetite HENT: Denies head trauma, headache, denies change in hearing, tinnitus.  Denies nasal congestion or bleeding.  Denies sore throat, sores in mouth.  Denies difficulty swallowing Eyes: Denies blurry vision, pain in eye, drainage.  Denies discoloration of eyes. Neck: Denies pain.  Denies swelling.  Denies pain with movement. Cardiovascular: Reports palpitations and edema. Denies chest pain.Denies orthopnea Respiratory: Reports shortness of breath with exertion. Denies cough. Denies wheezing.  Gastrointestinal: Denies abdominal pain, swelling.  Denies nausea, vomiting, diarrhea.  Denies melena.  Denies hematemesis. Musculoskeletal: Denies limitation of movement.  Denies deformity or swelling.  Denies pain.  Denies arthralgias or myalgias. Genitourinary: Denies pelvic pain.  Denies urinary frequency or hesitancy.  Denies dysuria.  Skin: Denies rash.  Denies petechiae, purpura, ecchymosis. Neurological: Denies syncope.  Denies seizure activity. Denies slurred speech, drooping face.  Denies visual change. Psychiatric: Denies depression, anxiety. Denies hallucinations.  Past Medical History:  Diagnosis Date   Arthritis    Atrial fibrillation ([place])    Complication of anesthesia    needs to sit up  at 45 degree angle or gets afib during surgery or in recovery   Diabetes mellitus without complication ([place])    type 2   DVT (deep venous thrombosis) ([place]) 2015   left leg.behind  knee   Foley catheter in place last changed 2eeks ago   last 7 months   GERD (gastroesophageal reflux disease)    Hx of radiation therapy 40 tx 2015   Hypercholesterolemia    Hypertension    Prostate cancer ([place]) 12/06/13   gleason 4+3=7, 6/12 cores positive. seed implant and radiation   Tourette's syndrome    Urinary retention    Wears glasses     Past Surgical History:  Procedure Laterality Date   APPENDECTOMY  1968   BALLOON DILATION N/A 08/02/2015   Procedure: BALLOON DILATION;  Surgeon: [name], MD;  Location: [facility];  Service: Endoscopy;  Laterality: N/A;   CARDIOVERSION N/A 05/13/2018   Procedure: CARDIOVERSION;  Surgeon: Sanda Klein, MD;  Location: [facility];  Service: Cardiovascular;  Laterality: N/A;   CARDIOVERSION N/A 05/18/2018   Procedure: CARDIOVERSION;  Surgeon: Buford Dresser, MD;  Location: [facility];  Service: Cardiovascular;  Laterality: N/A;   COLONOSCOPY WITH PROPOFOL N/A 08/02/2015   Procedure: COLONOSCOPY WITH PROPOFOL w/ APC;  Surgeon: [name], MD;  Location: [facility];  Service: Endoscopy;  Laterality: N/A;   CYSTOSCOPY N/A 10/05/2020   Procedure: CYSTOSCOPY;  Surgeon: Alexis Frock, MD;  Location: [facility];  Service: Urology;  Laterality: N/A;   ESOPHAGOGASTRODUODENOSCOPY (EGD) WITH PROPOFOL N/A 08/02/2015   Procedure: ESOPHAGOGASTRODUODENOSCOPY (EGD) WITH PROPOFOL/ possible dilation.;  Surgeon: [name], MD;  Location: [facility];  Service: Endoscopy;  Laterality: N/A;   KNEE SURGERY  2001   right knee orthoscopic   PROSTATE BIOPSY  12/06/13   Gleason 4+3=7, volume 35 gm   SPINE SURGERY  1963 and 1967   Tourette'ssyndrome spinal fluid removed    TONSILLECTOMY  as child   TRANSURETHRAL RESECTION OF PROSTATE N/A 09/30/2019   Procedure: TRANSURETHRAL RESECTION OF THE PROSTATE (TURP);  Surgeon: Alexis Frock, MD;  Location: [facility];  Service: Urology;  Laterality: N/A;  1 HR    TRANSURETHRAL RESECTION OF PROSTATE N/A 10/05/2020   Procedure: TRANSURETHRAL RESECTION OF THE PROSTATE (TURP);  Surgeon: Alexis Frock, MD;  Location: [facility];  Service: Urology;  Laterality: N/A;  1 HR    Social History  reports that he quit smoking about 9 years ago. His smoking use included cigarettes. He has a 11.50 pack-year smoking history. He has never used smokeless tobacco. He reports that he does not currently use alcohol. He reports current drug use.  Allergies  Allergen Reactions   Shingrix [Zoster Vac Recomb Adjuvanted] Shortness Of Breath and Other (See Comments)    Pulse increased to 170+ and it affected the A-Fib   Other     shingls shot caused atrial fib did not take 2nd shot    Family History  Problem Relation Age of Onset   Heart disease Mother        before age 40   Diabetes Mother    Hyperlipidemia Mother    Varicose Veins Mother    Hypertension Mother  Heart disease Father    Diabetes Father    Hyperlipidemia Father    Hypertension Father    Cancer Brother    COPD Brother    Diabetes Brother    Hyperlipidemia Brother    Hypertension Brother      Prior to Admission medications   Medication Sig Start Date End Date Taking? Authorizing Provider  acetaminophen (TYLENOL) 500 MG tablet Take 500-1,000 mg by mouth every 6 (six) hours as needed (for pain).   Yes [provider]  atorvastatin (LIPITOR) 80 MG tablet TAKE 1 TABLET BY MOUTH EVERY DAY Patient taking differently: Take 80 mg by mouth in the morning. 06/17/21  Yes Elayne Snare, MD  CALCIUM PO Take 1,000 mg by mouth daily.   Yes [provider]  Cholecalciferol (VITAMIN D3) 10 MCG (400 UNIT) CAPS Take 400 Units by mouth daily.   Yes [provider]  Cyanocobalamin (VITAMIN B 12 PO) Take 1,000 mcg by mouth daily.   Yes [provider]  diphenoxylate-atropine (LOMOTIL) 2.5-0.025 MG tablet TAKE 1 TABLET BY MOUTH 4 (FOUR) TIMES DAILY AS NEEDED FOR  DIARRHEA OR LOOSE STOOLS. 08/02/21  Yes [name], MD  Empagliflozin-metFORMIN HCl ER (SYNJARDY XR) 12.12-998 MG TB24 TAKE 2 TABLETS BY MOUTH EVERY DAY Patient taking differently: Take 1 tablet by mouth in the morning and at bedtime. 04/29/21  Yes Elayne Snare, MD  ERLEADA 60 MG tablet Take 240 mg by mouth daily. 01/30/21  Yes [provider]  furosemide (LASIX) 20 MG tablet Take 1 tablet by mouth daily as needed Patient taking differently: Take 20-40 mg by mouth daily as needed for edema. 05/16/21  Yes [name], MD  glimepiride (AMARYL) 1 MG tablet TAKE 1 TABLET BY MOUTH EVERY DAY WITH BREAKFAST Patient taking differently: Take 1 mg by mouth at bedtime. 06/20/21  Yes Elayne Snare, MD  leuprolide (LUPRON) 11.25 MG injection Inject 11.25 mg into the muscle every 6 (six) months.   Yes [provider]  losartan (COZAAR) 50 MG tablet 1 TABLET IN THE MORNING AND HALF TABLET IN THE EVENING Patient taking differently: Take 50 mg by mouth in the morning. 06/28/21  Yes Elayne Snare, MD  metoprolol tartrate (LOPRESSOR) 100 MG tablet Take 1 tablet (100 mg total) by mouth 2 (two) times daily. Appointment Required For Further Refills [phone] 08/02/21  Yes Sherran Needs, NP  pantoprazole (PROTONIX) 40 MG tablet TAKE 1 TABLET BY MOUTH EVERY DAY Patient taking differently: Take 40 mg by mouth daily before breakfast. 01/08/21  Yes [name], MD  Semaglutide,0.25 or 0.5MG/DOS, (OZEMPIC, 0.25 OR 0.5 MG/DOSE,) 2 MG/1.5ML SOPN Inject 0.5 mg into the skin once a week. Patient taking differently: Inject 1 mg into the skin every Friday. 07/10/21  Yes Elayne Snare, MD  tamsulosin (FLOMAX) 0.4 MG CAPS capsule Take 1 capsule (0.4 mg total) by mouth daily. Patient taking differently: Take 0.4 mg by mouth 2 (two) times daily. 08/14/19  Yes Shawna Clamp, MD  warfarin (COUMADIN) 5 MG tablet TAKE 1-2 TABLETS DAILY OR AS PRESCRIBED BY COUMADIN CLINIC. Patient taking differently: Take 5-7.5 mg by mouth  See admin instructions. Take 5 mg by mouth in the morning on Sun/Tues/Wed/Thurs/Sat and 7.5 mg on Mon/Fri 07/15/21  Yes Sherran Needs, NP  zinc gluconate 50 MG tablet Take 50 mg by mouth daily.   Yes [provider]  atorvastatin (LIPITOR) 40 MG tablet TAKE 1 TABLET BY MOUTH EVERY DAY Patient not taking: Reported on 09/07/2021 04/16/21  [name], MD  blood glucose meter kit and supplies KIT Test blood sugar daily as directed. Dx code: E11.9 01/23/15   Darlyne Russian, MD  docusate sodium (COLACE) 100 MG capsule Take 1 capsule (100 mg total) by mouth 2 (two) times daily. 10/06/20 10/06/21  Delight Hoh, MD  glucose blood (ACCU-CHEK GUIDE) test strip Use as instructed to check blood sugar twice daily. 10/29/18   Elayne Snare, MD  traMADol (ULTRAM) 50 MG tablet Take 1 tablet (50 mg total) by mouth every 6 (six) hours as needed for moderate pain or severe pain. Post-operatively Patient not taking: Reported on 09/07/2021 05/16/21 05/16/22  [name], MD    Physical Exam: Vitals:   09/07/21 0100 09/07/21 0130 09/07/21 0200 09/07/21 0230  BP: 117/79 115/76 123/77 113/84  Pulse: (!) 103 (!) 49 89 (!) 113  Resp: 12 17 (!) 21 20  Temp:      TempSrc:      SpO2: 100% 95% 95% 96%  Weight:      Height:        Constitutional: NAD, calm, comfortable Vitals:   09/07/21 0100 09/07/21 0130 09/07/21 0200 09/07/21 0230  BP: 117/79 115/76 123/77 113/84  Pulse: (!) 103 (!) 49 89 (!) 113  Resp: 12 17 (!) 21 20  Temp:      TempSrc:      SpO2: 100% 95% 95% 96%  Weight:      Height:       General: WDWN, Alert and oriented x3.  Eyes: EOMI, PERRL, conjunctivae normal.  Sclera nonicteric HENT:  [place]/AT, external ears normal.  Nares patent without epistasis.  Mucous membranes are moist. Posterior pharynx clear of any exudate Neck: Soft, normal range of motion, supple, no masses,  Trachea midline Respiratory: clear to auscultation bilaterally, no wheezing, no crackles. Normal respiratory effort. No  accessory muscle use.  Cardiovascular: Irregularly irregular rhythm, tachycardia. no murmurs / rubs / gallops. Mild lower edema. 2+ pedal pulses.  Abdomen: Soft, no tenderness, nondistended, no rebound or guarding.  Bowel sounds normoactive Musculoskeletal: FROM. no cyanosis. Normal muscle tone.  Skin: Warm, dry, intact no rashes, lesions, ulcers. No induration Neurologic: CN 2-12 grossly intact.  Normal speech.  Sensation intact Psychiatric: Normal judgment and insight.  Normal mood.   Labs on Admission: I have personally reviewed following labs and imaging studies  CBC: Recent Labs  Lab 09/06/21 1553  WBC 7.6  NEUTROABS 6.0  HGB 8.9*  HCT 28.6*  MCV 83.9  PLT 220    Basic Metabolic Panel: Recent Labs  Lab 09/06/21 1553  NA 137  K 4.0  CL 103  CO2 21*  GLUCOSE 164*  BUN 26*  CREATININE 1.17  CALCIUM 8.3*    GFR: Estimated Creatinine Clearance: 74.9 mL/min (by C-G formula based on SCr of 1.17 mg/dL).  Liver Function Tests: Recent Labs  Lab 09/06/21 1553  AST 20  ALT 14  ALKPHOS 83  BILITOT 1.0  PROT 6.9  ALBUMIN 2.5*    Urine analysis:    Component Value Date/Time   COLORURINE RED (A) 03/01/2021 1805   APPEARANCEUR CLOUDY (A) 03/01/2021 1805   LABSPEC 1.020 03/01/2021 1805   PHURINE 6.0 03/01/2021 1805   GLUCOSEU >=500 (A) 03/01/2021 1805   GLUCOSEU >=1000 (A) 11/22/2019 0850   HGBUR MODERATE (A) 03/01/2021 [place] 03/01/2021 1805   BILIRUBINUR neg 12/14/2013 0810   KETONESUR NEGATIVE 03/01/2021 1805   PROTEINUR 100 (A) 03/01/2021 1805   UROBILINOGEN 0.2 11/22/2019  [place] 03/01/2021 1805   LEUKOCYTESUR TRACE (A) 03/01/2021 1805    Radiological Exams on Admission: DG Chest 2 View  Result Date: 09/06/2021 CLINICAL DATA:  Shortness of breath. EXAM: CHEST - 2 VIEW COMPARISON:  May 22, 2019. FINDINGS: The heart size and mediastinal contours are within normal limits. Left lung is clear. There appears to be  interval development of right upper lobe opacity which may represent pneumonia, but neoplasm cannot be excluded. The visualized skeletal structures are unremarkable. IMPRESSION: Interval development of right upper lobe opacity which may represent pneumonia, but neoplasm cannot be excluded. CT scan of the chest is recommended for further evaluation. Electronically Signed   By: Marijo Conception M.D.   On: 09/06/2021 15:32   CT Angio Chest PE W/Cm &/Or Wo Cm  Result Date: 09/06/2021 CLINICAL DATA:  Pulmonary embolism (PE) suspected, high prob EXAM: CT ANGIOGRAPHY CHEST WITH CONTRAST TECHNIQUE: Multidetector CT imaging of the chest was performed using the standard protocol during bolus administration of intravenous contrast. Multiplanar CT image reconstructions and MIPs were obtained to evaluate the vascular anatomy. CONTRAST:  24m OMNIPAQUE IOHEXOL 350 MG/ML SOLN COMPARISON:  2017 FINDINGS: Cardiovascular: Satisfactory opacification of the pulmonary arteries to the segmental level. No evidence of pulmonary embolism. Normal heart size. No pericardial effusion. Coronary artery calcification. Thoracic aorta is normal in caliber with mild calcified plaque. Mediastinum/Nodes: No enlarged nodes. Included thyroid is unremarkable. Esophagus is unremarkable. Lungs/Pleura: No pleural effusion or pneumothorax. Questioned right upper lobe opacity on radiograph is not identified by CT. Minimal patchy ground-glass density in both lungs. Upper Abdomen: Possible bilateral hydronephrosis. This was present on 2020 abdomen CT and is of unknown chronicity. Musculoskeletal: Osseous metastatic disease. No new compression deformity. Review of the MIP images confirms the above findings. IMPRESSION: No evidence of acute pulmonary embolism. Right upper lobe opacity on radiograph is not confirmed by CT. There is minimal patchy ground-glass density in both lungs that could reflect atelectasis or possibly early changes of a  infectious/inflammatory process in the appropriate setting. Possible bilateral hydronephrosis. This was present on 2020 abdomen CT and may be chronic or recurrent. Diffuse osseous metastatic disease. Coronary artery and aortic calcification. Electronically Signed   By: PMacy MisM.D.   On: 09/06/2021 18:04    EKG: Independently reviewed.  EKG shows atrial fibrillation with heart rate of 130.  No acute ST elevation or depression.  QTc 461 Assessment/Plan Principal Problem:   Persistent atrial fibrillation Mr. SMormilehas chronic atrial fibrillation which is stable.  Laying and resting his heart rate will decrease to around 100 but when he is talking or moving around it increases to the 120-130 range. He is placed on telemetry for observation. He is on warfarin for anticoagulation with therapeutic INR of 2.8.  We will continue Coumadin and pharmacy to monitor and manage  Active Problems:   Near syncope Patient with near syncope at home when he would stand up.  This is been going on for the last few days he states that he has not had a syncopal episode or fallen.  He reports he has not been eating or drinking very much due to the chemotherapy is on takingly his appetite and his taste is off.  Is felt that he may be mildly dehydrated and was given some IV fluid in the emergency room. Check orthostatic blood pressure and vital signs Continue to IV fluids with LR at 100 mill per hour for the next 12 hours  Essential hypertension Continue home medications.  Monitor blood pressure    Malignant neoplasm of prostate metastatic to bone Continue his current regimen.  He does have side effect of edema of his legs with the chemotherapy.  He also has decreased appetite and poor taste.    Diabetes mellitus type 2 in obese  Continue home medication.  Monitor blood sugar.  Sliding zones and provide as needed for glycemic control.    Chronic anticoagulation Continue Coumadin.  Pharmacy to monitor and  manage    Peripheral edema Is chronic and a side effect of his chemotherapy  DVT prophylaxis: Is anticoagulated on Coumadin.  INR is therapeutic 2.8 Code Status:   Full code Family Communication:  Diagnosis plan discussed with patient.  Patient verbalized understanding agrees with plan.  Further recommendation follow as clinical Disposition Plan:   Patient is from:  Home  Anticipated DC to:  Home  Anticipated DC date:  Anticipate less than 2 midnight stay   Admission status:  Observation  Yevonne Aline Zeba Luby MD [facility]  How to contact the [facility] Attending or Consulting provider [place] or covering provider during after hours [place], for this patient?   Check the care team in [facility] and look for a) attending/consulting [facility] provider listed and b) the [facility] team listed Log into [email or web address] and use [place]'s universal password to access. If you do not have the password, please contact the hospital operator. Locate the [facility] provider you are looking for under [facility] and page to a number that you can be directly reached. If you still have difficulty reaching the provider, please page the [facility] (Director on Call) for the Hospitalists listed on amion for assistance.  09/07/2021, 2:52 AM

## 2021-09-07 NOTE — Progress Notes (Signed)
ANTICOAGULATION CONSULT NOTE - Initial Consult  Pharmacy Consult for Warfarin Indication: atrial fibrillation  Allergies  Allergen Reactions   Shingrix [Zoster Vac Recomb Adjuvanted] Shortness Of Breath and Other (See Comments)    Pulse increased to 170+ and it affected the A-Fib   Other     shingls shot caused atrial fib did not take 2nd shot    Patient Measurements: Height: 5\' 11"  (180.3 cm) Weight: 106.1 kg (234 lb) IBW/kg (Calculated) : 75.3    Vital Signs: Temp: 98 F (36.7 C) (01/06 2255) Temp Source: Oral (01/06 2255) BP: 113/84 (01/07 0230) Pulse Rate: 113 (01/07 0230)  Labs: Recent Labs    09/06/21 1553  HGB 8.9*  HCT 28.6*  PLT 264  APTT 52*  LABPROT 29.8*  INR 2.8*  CREATININE 1.17    Estimated Creatinine Clearance: 74.9 mL/min (by C-G formula based on SCr of 1.17 mg/dL).   Medical History: Past Medical History:  Diagnosis Date   Arthritis    Atrial fibrillation ([place])    Complication of anesthesia    needs to sit up  at 45 degree angle or gets afib during surgery or in recovery   Diabetes mellitus without complication ([place])    type 2   DVT (deep venous thrombosis) ([place]) 2015   left leg.behind knee   Foley catheter in place last changed 2eeks ago   last 7 months   GERD (gastroesophageal reflux disease)    Hx of radiation therapy 40 tx 2015   Hypercholesterolemia    Hypertension    Prostate cancer ([place]) 12/06/13   gleason 4+3=7, 6/12 cores positive. seed implant and radiation   Tourette's syndrome    Urinary retention    Wears glasses     Medications:  PTA on Warfarin 5mg  daily except 7.5mg  on Mondays and Fridays  Assessment: 71 yr male admitted with syncope and AFib PMH significant for metastatic prostate cancer, AFib, HTN, DM On Warfarin PTA with last dose reported taken on 1/6 @ 10am.  INR on 1/6 = 2.8  Goal of Therapy:  INR 2-3  Plan:  Daily PT/INR F/U INR on 1/7 and dose warfarin accordingly  Maridel Pixler, Toribio Harbour,  PharmD 09/07/2021,3:13 AM

## 2021-09-07 NOTE — Progress Notes (Signed)
°   09/07/21 1858  Vitals  Temp 98.5 F (36.9 C)  Temp Source Oral  BP 128/88  MAP (mmHg) 99  BP Location Right Arm  BP Method Automatic  Patient Position (if appropriate) Sitting  Pulse Rate (!) 119  Pulse Rate Source Monitor  ECG Heart Rate (!) 130  Resp 20  MEWS COLOR  MEWS Score Color Yellow  Oxygen Therapy  SpO2 97 %  O2 Device Room Air  MEWS Score  MEWS Temp 0  MEWS Systolic 0  MEWS Pulse 3  MEWS RR 0  MEWS LOC 0  MEWS Score 3   The patient arrived to the unit, alert, oriented x4, ambulatory 1 assist, gait steady. Sacrum buttocks red intact. HR elevated. Provider paged to assist.

## 2021-09-07 NOTE — Progress Notes (Signed)
Patient admitted to the hospital earlier this morning by Dr. Tonie Griffith  Patient seen and examined.  He sitting up in bed.  Reports that he has been experiencing shortness of breath on exertion along with dizziness.  He feels as though he will pass out when he tries to ambulate.  He does not have any chest pain.  He does note palpitations.  He has not had any blood in his stools, no melena or hematochezia.  Feels that most of his symptoms may be related to his chemo medication.  On exam, he is sitting up in bed, heart rate is ranging between 115 to 140 bpm.  Blood pressures in the low 100s.  Oxygen saturations are high 90s on room air.  Assessment/plan  Chronic atrial fibrillation without ventricular response -He is chronically on metoprolol 100 mg p.o. twice daily has been continued on the same -Heart rate is currently uncontrolled -With blood pressure low 100s, would not add diltiazem for rate control at this point -He has received digoxin loading doses -If tachycardia persists, may need to consider amiodarone infusion. -Check electrolytes and keep potassium greater than 4, magnesium greater than 2  Dyspnea on exertion/near syncope -I suspect this may be related to severe tachycardia when patient is ambulatory -He is also anemic which may also be contributing -We will need to reassess his symptoms on ambulation once his heart rate is better controlled  Hypertension -Blood pressure is low normal -Continued on metoprolol -Holding losartan  Prostate cancer with mets to bone -Continue treatment per oncology/urology  Lower extremity edema -May be cardiac in origin -We will check echocardiogram once her rate is better controlled  Anemia -Denies any gross blood loss -May have been on the chronic disease related to his prostate cancer -Follow hemoglobin and transfuse for hemoglobin less than 8  Diabetes, type II -He is continued on his home medications of metformin and  Jardiance -Continue on sliding scale insulin.  Tommy Yang

## 2021-09-08 DIAGNOSIS — R0609 Other forms of dyspnea: Secondary | ICD-10-CM | POA: Diagnosis not present

## 2021-09-08 DIAGNOSIS — Z79818 Long term (current) use of other agents affecting estrogen receptors and estrogen levels: Secondary | ICD-10-CM | POA: Diagnosis not present

## 2021-09-08 DIAGNOSIS — E669 Obesity, unspecified: Secondary | ICD-10-CM | POA: Diagnosis present

## 2021-09-08 DIAGNOSIS — Z79899 Other long term (current) drug therapy: Secondary | ICD-10-CM | POA: Diagnosis not present

## 2021-09-08 DIAGNOSIS — Z809 Family history of malignant neoplasm, unspecified: Secondary | ICD-10-CM | POA: Diagnosis not present

## 2021-09-08 DIAGNOSIS — Z978 Presence of other specified devices: Secondary | ICD-10-CM

## 2021-09-08 DIAGNOSIS — C7951 Secondary malignant neoplasm of bone: Secondary | ICD-10-CM | POA: Diagnosis present

## 2021-09-08 DIAGNOSIS — D630 Anemia in neoplastic disease: Secondary | ICD-10-CM | POA: Diagnosis not present

## 2021-09-08 DIAGNOSIS — I10 Essential (primary) hypertension: Secondary | ICD-10-CM | POA: Diagnosis present

## 2021-09-08 DIAGNOSIS — I4821 Permanent atrial fibrillation: Secondary | ICD-10-CM | POA: Diagnosis present

## 2021-09-08 DIAGNOSIS — N139 Obstructive and reflux uropathy, unspecified: Secondary | ICD-10-CM | POA: Diagnosis present

## 2021-09-08 DIAGNOSIS — E1169 Type 2 diabetes mellitus with other specified complication: Secondary | ICD-10-CM | POA: Diagnosis not present

## 2021-09-08 DIAGNOSIS — Z9079 Acquired absence of other genital organ(s): Secondary | ICD-10-CM | POA: Diagnosis not present

## 2021-09-08 DIAGNOSIS — R55 Syncope and collapse: Secondary | ICD-10-CM | POA: Diagnosis not present

## 2021-09-08 DIAGNOSIS — C61 Malignant neoplasm of prostate: Secondary | ICD-10-CM | POA: Diagnosis present

## 2021-09-08 DIAGNOSIS — Z87891 Personal history of nicotine dependence: Secondary | ICD-10-CM | POA: Diagnosis not present

## 2021-09-08 DIAGNOSIS — Z7901 Long term (current) use of anticoagulants: Secondary | ICD-10-CM | POA: Diagnosis not present

## 2021-09-08 DIAGNOSIS — K219 Gastro-esophageal reflux disease without esophagitis: Secondary | ICD-10-CM | POA: Diagnosis present

## 2021-09-08 DIAGNOSIS — Z86718 Personal history of other venous thrombosis and embolism: Secondary | ICD-10-CM | POA: Diagnosis not present

## 2021-09-08 DIAGNOSIS — Z833 Family history of diabetes mellitus: Secondary | ICD-10-CM | POA: Diagnosis not present

## 2021-09-08 DIAGNOSIS — I4819 Other persistent atrial fibrillation: Secondary | ICD-10-CM | POA: Diagnosis not present

## 2021-09-08 DIAGNOSIS — E7800 Pure hypercholesterolemia, unspecified: Secondary | ICD-10-CM | POA: Diagnosis present

## 2021-09-08 DIAGNOSIS — Z8249 Family history of ischemic heart disease and other diseases of the circulatory system: Secondary | ICD-10-CM | POA: Diagnosis not present

## 2021-09-08 DIAGNOSIS — F952 Tourette's disorder: Secondary | ICD-10-CM | POA: Diagnosis present

## 2021-09-08 DIAGNOSIS — E119 Type 2 diabetes mellitus without complications: Secondary | ICD-10-CM | POA: Diagnosis present

## 2021-09-08 DIAGNOSIS — Z20822 Contact with and (suspected) exposure to covid-19: Secondary | ICD-10-CM | POA: Diagnosis present

## 2021-09-08 DIAGNOSIS — Z923 Personal history of irradiation: Secondary | ICD-10-CM | POA: Diagnosis not present

## 2021-09-08 DIAGNOSIS — Z7984 Long term (current) use of oral hypoglycemic drugs: Secondary | ICD-10-CM | POA: Diagnosis not present

## 2021-09-08 LAB — PROTIME-INR
INR: 3.8 — ABNORMAL HIGH (ref 0.8–1.2)
Prothrombin Time: 37.6 seconds — ABNORMAL HIGH (ref 11.4–15.2)

## 2021-09-08 LAB — URINALYSIS, ROUTINE W REFLEX MICROSCOPIC
Bilirubin Urine: NEGATIVE
Glucose, UA: >=500 mg/dL — AB
Hgb urine dipstick: NEGATIVE
Ketones, ur: NEGATIVE mg/dL
Nitrite: NEGATIVE
Protein, ur: 30 mg/dL — AB
Specific Gravity, Urine: 1.021 (ref 1.005–1.030)
WBC, UA: >50 WBC/hpf — ABNORMAL HIGH (ref 0–5)
pH: 5 (ref 5.0–8.0)

## 2021-09-08 LAB — BASIC METABOLIC PANEL
Anion gap: 8 (ref 5–15)
BUN: 16 mg/dL (ref 8–23)
CO2: 23 mmol/L (ref 22–32)
Calcium: 7.7 mg/dL — ABNORMAL LOW (ref 8.9–10.3)
Chloride: 106 mmol/L (ref 98–111)
Creatinine, Ser: 0.91 mg/dL (ref 0.61–1.24)
GFR, Estimated: >60 mL/min (ref >60)
Glucose, Bld: 159 mg/dL — ABNORMAL HIGH (ref 70–99)
Potassium: 4.5 mmol/L (ref 3.5–5.1)
Sodium: 137 mmol/L (ref 135–145)

## 2021-09-08 LAB — CBC
HCT: 28.6 % — ABNORMAL LOW (ref 39.0–52.0)
Hemoglobin: 8.6 g/dL — ABNORMAL LOW (ref 13.0–17.0)
MCH: 25.7 pg — ABNORMAL LOW (ref 26.0–34.0)
MCHC: 30.1 g/dL (ref 30.0–36.0)
MCV: 85.6 fL (ref 80.0–100.0)
Platelets: 250 10*3/uL (ref 150–400)
RBC: 3.34 MIL/uL — ABNORMAL LOW (ref 4.22–5.81)
RDW: 19.9 % — ABNORMAL HIGH (ref 11.5–15.5)
WBC: 7.1 10*3/uL (ref 4.0–10.5)
nRBC: 0 % (ref 0.0–0.2)

## 2021-09-08 LAB — GLUCOSE, CAPILLARY: Glucose-Capillary: 148 mg/dL — ABNORMAL HIGH (ref 70–99)

## 2021-09-08 MED ORDER — LACTATED RINGERS IV SOLN: INTRAVENOUS | Status: AC

## 2021-09-08 MED ORDER — CHLORHEXIDINE GLUCONATE CLOTH 2 % EX PADS
6.0000 | MEDICATED_PAD | Freq: Every day | CUTANEOUS | Status: DC
  Administered 2021-09-09 – 2021-09-12 (×4): 6 via TOPICAL

## 2021-09-08 NOTE — Plan of Care (Signed)

## 2021-09-08 NOTE — Progress Notes (Signed)
MD paged. Informed pts BP is 90/57. He has amiodarone infusing at 60 mg/hr (33.3 mL/hr) but no orders for when to hold it. Please address

## 2021-09-08 NOTE — Progress Notes (Signed)
Pt has a chronic foley, changed 2 days ago, for his prostate and the radiation he gets. MD notified so foley order can be placed.

## 2021-09-08 NOTE — Progress Notes (Signed)
PROGRESS NOTE    Tommy Yang  GUY:[id number] DOB: [birth year] DOA: 09/06/2021 PCP: [name], MD    Brief Narrative:  71 year old male with a history of prostate cancer with bone mets, chronic atrial fibrillation, hypertension, diabetes, presents with dyspnea on exertion and near syncope while ambulating.  Noted to be in rapid atrial fibrillation on admission.  Admitted for further heart rate control.   Assessment & Plan:   Principal Problem:   Persistent atrial fibrillation Active Problems:   Chronic anticoagulation   Essential hypertension   Malignant neoplasm of prostate metastatic to bone [facility])   Peripheral edema   Near syncope   Diabetes mellitus type 2 in obese (HCC)   Atrial fibrillation with RVR (HCC)   Anemia   Chronic indwelling Foley catheter   Chronic atrial fibrillation without ventricular response -He is chronically on metoprolol 100 mg p.o. twice daily and has been continued on the same -Heart rate is currently uncontrolled ranging between 100-130, up to 160 on exertion -He was started on amiodarone infusion overnight -Case reviewed with Dr. Harrington Challenger, cardiology recommended to continue the patient on amiodarone at 60 mg/h until tomorrow -Checking TSH -Urinalysis indicates pyuria, but has rare bacteria.  He does not have any other signs of UTI.  No fever or leukocytosis.  Suspect some degree of asymptomatic bacteriuria in a patient with chronic Foley. -Cardiology will evaluate in a.m. -Check electrolytes and keep potassium greater than 4, magnesium greater than 2 -Anticoagulated with Coumadin, INR therapeutic   Dyspnea on exertion/near syncope -I suspect this may be related to severe tachycardia when patient is ambulatory -He is anemic, but hemoglobin appears to be stable -Overall he does feel mildly better since admission   Hypertension -Blood pressure is low normal -Continued on metoprolol -Holding losartan   Prostate cancer with mets to  bone -Continue treatment per oncology/urology -We will discuss with his primary urologist regarding continuing his cancer medication while in the hospital  Chronic Foley catheter -Due to prostate issues -Continue while admitted   Lower extremity edema -May be cardiac in origin -We will check echocardiogram once heart rate is better controlled   Anemia -Denies any gross blood loss -Likely secondary to chronic disease related to his prostate cancer -Follow hemoglobin and transfuse for hemoglobin less than 8   Diabetes, type II -He is continued on his home medications of metformin and Jardiance -Continue on sliding scale insulin. -Blood sugars currently stable   DVT prophylaxis:   Coumadin  Code Status: Full code Family Communication: Updated wife at the bedside Disposition Plan: Status is: Inpatient  Remains inpatient appropriate because: Continued management of rapid atrial fibrillation with IV medications         Consultants:    Procedures:    Antimicrobials:      Subjective: He is unhappy about diet restrictions and is requesting a regular diet.  Overall he says he does feel better in terms of his dyspnea on exertion.  He does note that his heart rate got up to 160 on exertion today.  Objective: Vitals:   09/08/21 0804 09/08/21 1039 09/08/21 1200 09/08/21 1600  BP: 111/82  (!) 123/92 114/80  Pulse: (!) 112 95 93 96  Resp: 19  20 20   Temp:      TempSrc:      SpO2: 95%  97%   Weight:      Height:        Intake/Output Summary (Last 24 hours) at 09/08/2021 2103 Last data filed at 09/08/2021  1941 Gross per 24 hour  Intake 3092.54 ml  Output 1125 ml  Net 1967.54 ml   Filed Weights   09/06/21 1438  Weight: 106.1 kg    Examination:  General exam: Appears calm and comfortable  Respiratory system: Clear to auscultation. Respiratory effort normal. Cardiovascular system: S1 & S2 heard, irregular. No JVD, murmurs, rubs, gallops or clicks. No pedal  edema. Gastrointestinal system: Abdomen is nondistended, soft and nontender. No organomegaly or masses felt. Normal bowel sounds heard. Central nervous system: Alert and oriented. No focal neurological deficits. Extremities: Symmetric 5 x 5 power. Skin: No rashes, lesions or ulcers Psychiatry: Judgement and insight appear normal. Mood & affect appropriate.     Data Reviewed: I have personally reviewed following labs and imaging studies  CBC: Recent Labs  Lab 09/06/21 1553 09/07/21 0500 09/07/21 1909 09/08/21 0513  WBC 7.6 6.6 7.9 7.1  NEUTROABS 6.0  --   --   --   HGB 8.9* 8.4* 8.6* 8.6*  HCT 28.6* 27.7* 28.0* 28.6*  MCV 83.9 87.1 87.0 85.6  PLT 264 228 247 287   Basic Metabolic Panel: Recent Labs  Lab 09/06/21 1553 09/07/21 0500 09/07/21 1909 09/08/21 0513  NA 137 136 137 137  K 4.0 3.6 4.0 4.5  CL 103 101 105 106  CO2 21* 22 22 23   GLUCOSE 164* 163* 153* 159*  BUN 26* 23 19 16   CREATININE 1.17 0.92 0.96 0.91  CALCIUM 8.3* 7.9* 7.9* 7.7*  MG  --   --  1.7  --    GFR: Estimated Creatinine Clearance: 96.3 mL/min (by C-G formula based on SCr of 0.91 mg/dL). Liver Function Tests: Recent Labs  Lab 09/06/21 1553  AST 20  ALT 14  ALKPHOS 83  BILITOT 1.0  PROT 6.9  ALBUMIN 2.5*   No results for input(s): LIPASE, AMYLASE in the last 168 hours. No results for input(s): AMMONIA in the last 168 hours. Coagulation Profile: Recent Labs  Lab 09/03/21 0848 09/06/21 1553 09/07/21 0500 09/08/21 0513  INR 1.2* 2.8* 3.0* 3.8*   Cardiac Enzymes: No results for input(s): CKTOTAL, CKMB, CKMBINDEX, TROPONINI in the last 168 hours. BNP (last 3 results) No results for input(s): PROBNP in the last 8760 hours. HbA1C: Recent Labs    09/07/21 0430  HGBA1C 7.0*   CBG: Recent Labs  Lab 09/07/21 0820 09/07/21 1242 09/07/21 1745 09/07/21 2048 09/08/21 0720  GLUCAP 150* 146* 142* 150* 148*   Lipid Profile: No results for input(s): CHOL, HDL, LDLCALC, TRIG,  CHOLHDL, LDLDIRECT in the last 72 hours. Thyroid Function Tests: No results for input(s): TSH, T4TOTAL, FREET4, T3FREE, THYROIDAB in the last 72 hours. Anemia Panel: No results for input(s): VITAMINB12, FOLATE, FERRITIN, TIBC, IRON, RETICCTPCT in the last 72 hours. Sepsis Labs: No results for input(s): PROCALCITON, LATICACIDVEN in the last 168 hours.  Recent Results (from the past 240 hour(s))  Resp Panel by RT-PCR (Flu A&B, Covid) Nasopharyngeal Swab     Status: None   Collection Time: 09/07/21  6:39 AM   Specimen: Nasopharyngeal Swab; Nasopharyngeal(NP) swabs in vial transport medium  Result Value Ref Range Status   SARS Coronavirus 2 by RT PCR NEGATIVE NEGATIVE Final    Comment: (NOTE) SARS-CoV-2 target nucleic acids are NOT DETECTED.  The SARS-CoV-2 RNA is generally detectable in upper respiratory specimens during the acute phase of infection. The lowest concentration of SARS-CoV-2 viral copies this assay can detect is 138 copies/mL. A negative result does not preclude SARS-Cov-2 infection and should not be used  as the sole basis for treatment or other patient management decisions. A negative result may occur with  improper specimen collection/handling, submission of specimen other than nasopharyngeal swab, presence of viral mutation(s) within the areas targeted by this assay, and inadequate number of viral copies(<138 copies/mL). A negative result must be combined with clinical observations, patient history, and epidemiological information. The expected result is Negative.  Fact Sheet for Patients:  EntrepreneurPulse.com.au  Fact Sheet for Healthcare Providers:  IncredibleEmployment.be  This test is no t yet approved or cleared by the [place] FDA and  has been authorized for detection and/or diagnosis of SARS-CoV-2 by FDA under an Emergency Use Authorization (EUA). This EUA will remain  in effect (meaning this test can be used) for  the duration of the COVID-19 declaration under Section 564(b)(1) of the Act, 21 U.S.C.section 360bbb-3(b)(1), unless the authorization is terminated  or revoked sooner.       Influenza A by PCR NEGATIVE NEGATIVE Final   Influenza B by PCR NEGATIVE NEGATIVE Final    Comment: (NOTE) The Xpert Xpress SARS-CoV-2/FLU/RSV plus assay is intended as an aid in the diagnosis of influenza from Nasopharyngeal swab specimens and should not be used as a sole basis for treatment. Nasal washings and aspirates are unacceptable for Xpert Xpress SARS-CoV-2/FLU/RSV testing.  Fact Sheet for Patients: EntrepreneurPulse.com.au  Fact Sheet for Healthcare Providers: IncredibleEmployment.be  This test is not yet approved or cleared by the [place] FDA and has been authorized for detection and/or diagnosis of SARS-CoV-2 by FDA under an Emergency Use Authorization (EUA). This EUA will remain in effect (meaning this test can be used) for the duration of the COVID-19 declaration under Section 564(b)(1) of the Act, 21 U.S.C. section 360bbb-3(b)(1), unless the authorization is terminated or revoked.  Performed at [facility], [place] [address] [place], [place] [address]          Radiology Studies: No results found.      Scheduled Meds:  apalutamide  240 mg Oral Daily   atorvastatin  80 mg Oral Daily   Chlorhexidine Gluconate Cloth  6 each Topical Daily   empagliflozin  25 mg Oral Q breakfast   And   metFORMIN  2,000 mg Oral Q breakfast   insulin aspart  0-9 Units Subcutaneous TID WC & HS   metoprolol tartrate  100 mg Oral BID   pantoprazole  40 mg Oral QAC breakfast   tamsulosin  0.4 mg Oral BID   Warfarin - Pharmacist Dosing Inpatient   Does not apply q1600   Continuous Infusions:  amiodarone 60 mg/hr (09/08/21 1920)   lactated ringers 100 mL/hr at 09/08/21 0316     LOS: 0 days    Time spent: 21mins    Kathie Dike,  MD [facility]   If 7PM-7AM, please contact night-coverage [email or web address]  09/08/2021, 9:03 PM

## 2021-09-08 NOTE — Progress Notes (Signed)
ANTICOAGULATION CONSULT NOTE - Initial Consult  Pharmacy Consult for warfarin Indication:  history of atrial fibrillation, DVT  Allergies  Allergen Reactions   Shingrix [Zoster Vac Recomb Adjuvanted] Shortness Of Breath and Other (See Comments)    Pulse increased to 170+ and it affected the A-Fib   Other     shingls shot caused atrial fib did not take 2nd shot    Patient Measurements: Height: 5\' 11"  (180.3 cm) Weight: 106.1 kg (234 lb) IBW/kg (Calculated) : 75.3  Vital Signs: Temp: 98.3 F (36.8 C) (01/08 0400) Temp Source: Oral (01/08 0400) BP: 111/82 (01/08 0804) Pulse Rate: 112 (01/08 0804)  Labs: Recent Labs    09/06/21 1553 09/07/21 0500 09/07/21 1909 09/08/21 0513  HGB 8.9* 8.4* 8.6* 8.6*  HCT 28.6* 27.7* 28.0* 28.6*  PLT 264 228 247 250  APTT 52*  --   --   --   LABPROT 29.8* 31.2*  --  37.6*  INR 2.8* 3.0*  --  3.8*  CREATININE 1.17 0.92 0.96 0.91     Estimated Creatinine Clearance: 96.3 mL/min (by C-G formula based on SCr of 0.91 mg/dL).   Medical History: Past Medical History:  Diagnosis Date   Arthritis    Atrial fibrillation ([place])    Complication of anesthesia    needs to sit up  at 45 degree angle or gets afib during surgery or in recovery   Diabetes mellitus without complication ([place])    type 2   DVT (deep venous thrombosis) ([place]) 2015   left leg.behind knee   Foley catheter in place last changed 2eeks ago   last 7 months   GERD (gastroesophageal reflux disease)    Hx of radiation therapy 40 tx 2015   Hypercholesterolemia    Hypertension    Prostate cancer ([place]) 12/06/13   gleason 4+3=7, 6/12 cores positive. seed implant and radiation   Tourette's syndrome    Urinary retention    Wears glasses     Medications: Warfarin PTA. Last dose 09/06/21  The following information obtained from [facility] clinic notes: -Home dose: Warfarin 7.5 mg M,F; 5 mg all other days of the week -Dose recently reduced on 09/03/21 after a supratherapeutic INR reading of  >10 on 08/30/21 while taking 7.5 mg daily  Assessment: Pt is a 81 yoM with PMH significant for metastatic prostate cancer, reporting poor appetite and significant weight loss. Pt chronically anticoagulation with warfarin for afib and hx VTE. Pharmacy consulted to manage warfarin inpatient.  Today, 09/08/21 INR = 3.8 is supratherapeutic CBC: Hgb low but stable; Plt WNL & stable. No documented bleeding. Diet: Heart healthy/carb modified. 25% meal intake charted DDI: Amiodarone drip started on 1/7 PM. Amiodarone may increase serum concentration of warfarin, thus enhancing anticoagulant effects. Could be delayed DDI due to long half-life of amiodarone. If amiodarone will be continued at discharge, consider empiric dose reduction of warfarin regimen.  Goal of Therapy:  INR 2-3 Monitor platelets by anticoagulation protocol: Yes   Plan:  HOLD warfarin today for supratherapeutic INR Recheck INR/CBC with AM labs tomorrow  Lenis Noon, PharmD 09/08/2021,9:47 AM

## 2021-09-08 NOTE — Progress Notes (Signed)
Patient's blood glucose was 171. Glucometer did not transfer data to patient's chart.

## 2021-09-09 ENCOUNTER — Inpatient Hospital Stay (HOSPITAL_COMMUNITY): Payer: BC Managed Care – PPO

## 2021-09-09 DIAGNOSIS — Z7901 Long term (current) use of anticoagulants: Secondary | ICD-10-CM | POA: Diagnosis not present

## 2021-09-09 DIAGNOSIS — Z978 Presence of other specified devices: Secondary | ICD-10-CM | POA: Diagnosis not present

## 2021-09-09 DIAGNOSIS — C61 Malignant neoplasm of prostate: Secondary | ICD-10-CM

## 2021-09-09 DIAGNOSIS — I4819 Other persistent atrial fibrillation: Secondary | ICD-10-CM

## 2021-09-09 DIAGNOSIS — R55 Syncope and collapse: Secondary | ICD-10-CM | POA: Diagnosis not present

## 2021-09-09 DIAGNOSIS — D630 Anemia in neoplastic disease: Secondary | ICD-10-CM | POA: Diagnosis not present

## 2021-09-09 DIAGNOSIS — R0609 Other forms of dyspnea: Secondary | ICD-10-CM | POA: Diagnosis not present

## 2021-09-09 DIAGNOSIS — C7951 Secondary malignant neoplasm of bone: Secondary | ICD-10-CM

## 2021-09-09 LAB — ECHOCARDIOGRAM COMPLETE
AR max vel: 2.46 cm2
AV Area VTI: 2.54 cm2
AV Area mean vel: 2.39 cm2
AV Mean grad: 4.3 mmHg
AV Peak grad: 7.8 mmHg
Ao pk vel: 1.4 m/s
Area-P 1/2: 4.06 cm2
Height: 71 in
S' Lateral: 3.3 cm
Weight: 3744 oz

## 2021-09-09 LAB — GLUCOSE, CAPILLARY
Glucose-Capillary: 148 mg/dL — ABNORMAL HIGH (ref 70–99)
Glucose-Capillary: 160 mg/dL — ABNORMAL HIGH (ref 70–99)
Glucose-Capillary: 171 mg/dL — ABNORMAL HIGH (ref 70–99)
Glucose-Capillary: 134 mg/dL — ABNORMAL HIGH (ref 70–99)
Glucose-Capillary: 161 mg/dL — ABNORMAL HIGH (ref 70–99)
Glucose-Capillary: 163 mg/dL — ABNORMAL HIGH (ref 70–99)
Glucose-Capillary: 156 mg/dL — ABNORMAL HIGH (ref 70–99)

## 2021-09-09 LAB — CBC
HCT: 26.6 % — ABNORMAL LOW (ref 39.0–52.0)
Hemoglobin: 8 g/dL — ABNORMAL LOW (ref 13.0–17.0)
MCH: 26 pg (ref 26.0–34.0)
MCHC: 30.1 g/dL (ref 30.0–36.0)
MCV: 86.4 fL (ref 80.0–100.0)
Platelets: 224 10*3/uL (ref 150–400)
RBC: 3.08 MIL/uL — ABNORMAL LOW (ref 4.22–5.81)
RDW: 19.9 % — ABNORMAL HIGH (ref 11.5–15.5)
WBC: 6.1 10*3/uL (ref 4.0–10.5)
nRBC: 0 % (ref 0.0–0.2)

## 2021-09-09 LAB — TSH: TSH: 1.574 u[IU]/mL (ref 0.350–4.500)

## 2021-09-09 LAB — PROTIME-INR
INR: 3.1 — ABNORMAL HIGH (ref 0.8–1.2)
Prothrombin Time: 31.7 seconds — ABNORMAL HIGH (ref 11.4–15.2)

## 2021-09-09 MED ORDER — WARFARIN SODIUM 1 MG PO TABS
1.0000 mg | ORAL_TABLET | Freq: Once | ORAL | Status: AC
  Administered 2021-09-09: 1 mg via ORAL
  Filled 2021-09-09: qty 1

## 2021-09-09 NOTE — Progress Notes (Signed)
ANTICOAGULATION CONSULT NOTE - Follow Up Consult  Pharmacy Consult for warfarin Indication: hx atrial fibrillation and DVT  Allergies  Allergen Reactions   Shingrix [Zoster Vac Recomb Adjuvanted] Shortness Of Breath and Other (See Comments)    Pulse increased to 170+ and it affected the A-Fib   Other     shingls shot caused atrial fib did not take 2nd shot    Patient Measurements: Height: 5\' 11"  (180.3 cm) Weight: 106.1 kg (234 lb) IBW/kg (Calculated) : 75.3 Heparin Dosing Weight:   Vital Signs: BP: 105/71 (01/09 0907) Pulse Rate: 106 (01/09 0907)  Labs: Recent Labs    09/06/21 1553 09/07/21 0500 09/07/21 1909 09/08/21 0513 09/09/21 0441  HGB 8.9* 8.4* 8.6* 8.6* 8.0*  HCT 28.6* 27.7* 28.0* 28.6* 26.6*  PLT 264 228 247 250 224  APTT 52*  --   --   --   --   LABPROT 29.8* 31.2*  --  37.6* 31.7*  INR 2.8* 3.0*  --  3.8* 3.1*  CREATININE 1.17 0.92 0.96 0.91  --     Estimated Creatinine Clearance: 96.3 mL/min (by C-G formula based on SCr of 0.91 mg/dL).   Medications:  The following information obtained from [facility] clinic notes: -Home dose: Warfarin 7.5 mg M,F; 5 mg all other days of the week -Dose recently reduced on 09/03/21 after a supratherapeutic INR reading of >10 on 08/30/21 while taking 7.5 mg daily  Assessment: Patient is a 71 y.o M with hx metastatic cancer, DVT and afib on warfarin PTA, presented to the ED on 09/06/21 with c/o SOB and dizziness. Chest CT on 09/06/21 was negative for PE.  Warfarin resumed on admission.  Today, 09/09/2021: - INR is trending down but remains slightly supra-therapeutic at 3.1 (with last dose of 3mg  x1 given on 1/7) - cbc somewhat stable - no bleeding documented - signif drug-drug intxns: amiodarone can increase INR (amio drip started on 09/07/21)   Goal of Therapy:  INR 2-3 Monitor platelets by anticoagulation protocol: Yes   Plan:  - will give a small dose of warfarin 1 mg PO x1 today - daily INR - monitor for s/sx  bleeding  Dhana Totton P 09/09/2021,9:24 AM

## 2021-09-09 NOTE — Consult Note (Signed)
Cardiology Consultation:   Patient ID: Tommy Yang MRN: [id number]; DOB: 01/02/1951  Admit date: 09/06/2021 Date of Consult: 09/09/2021  PCP:  [name], MD   [facility] Providers Cardiologist:  Remotely seen by Dr. Claiborne Billings, recently followed by afib clinic     Patient Profile:   Tommy Yang is a 71 y.o. 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 Roderic Palau.  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  Patient was seen remotely primary doctor daily, last visit was in April 2017.  He was previously on Xarelto 20 mg daily for his lower extremity DVT in 2015.  Echocardiogram obtained on 02/01/2015 showed EF 55 to 60%, mild focal basal hypertrophy of the septum, grade 1 DD.  He has been established and followed by A. fib clinic since 2019.  Last echocardiogram obtained on 05/02/2019 showed EF 55 to 60%, mild thickening of the mitral valve leaflet.  According to her A. fib clinic note, patient previously mentioned he suspected he had A. fib for 2 years before he was diagnosed.  Antiarrhythmic therapy has been offered to the patient however he deferred as he was minimally symptomatic.  He was last seen by the A. fib clinic on 03/20/2021 at which time he was doing well.  Unfortunately due to metastasis to the bone, he required chemotherapy which may interfere with his Xarelto, therefore it was decided to switch him to Coumadin instead.  He has been receiving both chemo and radiation therapy.  Unfortunately he has been  having significant diarrhea, poor appetite and aggressive weight loss of nearly 70 pounds since earlier this year.  He has not had chemotherapy for the past month, therefore diarrhea has improved.  He feels cold all the time but no fever or cough.  Over the past few months, he has been having worsening dyspnea on exertion.  He also has dizziness upon standing and feels like his vision was going out after walking short distance.  Due to concern for near blacking out spells, he eventually sought medical attention at [facility].  On arrival to [facility], his blood pressure was borderline low. Orthostatic vital signs obtained on 09/07/2021 showed no significant change in the blood pressure, however heart rate increased from 110 laying down to 166 with standing.  Hemoglobin was also low around 8.  Home losartan has been discontinued.  Home metoprolol 100 mg twice a day was continued, however due to persistent elevated heart rates, he was also started on IV amiodarone for rate control.  According to his patient, since hospitalization, his dizzy spell has significantly improved.   Past Medical History:  Diagnosis Date   Arthritis    Atrial fibrillation ([place])    Complication of anesthesia    needs to sit up  at 45 degree angle or gets afib during surgery or in recovery   Diabetes mellitus without complication ([place])    type 2   DVT (deep venous thrombosis) ([place]) 2015   left leg.behind knee   Foley catheter in place last changed 2eeks ago  last 7 months   GERD (gastroesophageal reflux disease)    Hx of radiation therapy 40 tx 2015   Hypercholesterolemia    Hypertension    Prostate cancer ([place]) 12/06/13   gleason 4+3=7, 6/12 cores positive. seed implant and radiation   Tourette's syndrome    Urinary retention    Wears glasses     Past Surgical History:  Procedure Laterality Date   APPENDECTOMY  1968   BALLOON DILATION N/A 08/02/2015   Procedure: BALLOON DILATION;  Surgeon:  [name], MD;  Location: [facility];  Service: Endoscopy;  Laterality: N/A;   CARDIOVERSION N/A 05/13/2018   Procedure: CARDIOVERSION;  Surgeon: Sanda Klein, MD;  Location: [facility];  Service: Cardiovascular;  Laterality: N/A;   CARDIOVERSION N/A 05/18/2018   Procedure: CARDIOVERSION;  Surgeon: Buford Dresser, MD;  Location: [facility];  Service: Cardiovascular;  Laterality: N/A;   COLONOSCOPY WITH PROPOFOL N/A 08/02/2015   Procedure: COLONOSCOPY WITH PROPOFOL w/ APC;  Surgeon: [name], MD;  Location: [facility];  Service: Endoscopy;  Laterality: N/A;   CYSTOSCOPY N/A 10/05/2020   Procedure: CYSTOSCOPY;  Surgeon: Alexis Frock, MD;  Location: [facility];  Service: Urology;  Laterality: N/A;   ESOPHAGOGASTRODUODENOSCOPY (EGD) WITH PROPOFOL N/A 08/02/2015   Procedure: ESOPHAGOGASTRODUODENOSCOPY (EGD) WITH PROPOFOL/ possible dilation.;  Surgeon: [name], MD;  Location: [facility];  Service: Endoscopy;  Laterality: N/A;   KNEE SURGERY  2001   right knee orthoscopic   PROSTATE BIOPSY  12/06/13   Gleason 4+3=7, volume 35 gm   SPINE SURGERY  1963 and 1967   Tourette'ssyndrome spinal fluid removed    TONSILLECTOMY  as child   TRANSURETHRAL RESECTION OF PROSTATE N/A 09/30/2019   Procedure: TRANSURETHRAL RESECTION OF THE PROSTATE (TURP);  Surgeon: Alexis Frock, MD;  Location: [facility];  Service: Urology;  Laterality: N/A;  1 HR   TRANSURETHRAL RESECTION OF PROSTATE N/A 10/05/2020   Procedure: TRANSURETHRAL RESECTION OF THE PROSTATE (TURP);  Surgeon: Alexis Frock, MD;  Location: [facility];  Service: Urology;  Laterality: N/A;  1 HR     Home Medications:  Prior to Admission medications   Medication Sig Start Date End Date Taking? Authorizing Provider  acetaminophen (TYLENOL) 500 MG tablet Take 500-1,000 mg by mouth every 6 (six) hours as needed (for pain).   Yes [provider]  atorvastatin  (LIPITOR) 80 MG tablet TAKE 1 TABLET BY MOUTH EVERY DAY Patient taking differently: Take 80 mg by mouth in the morning. 06/17/21  Yes Elayne Snare, MD  CALCIUM PO Take 1,000 mg by mouth daily.   Yes [provider]  Cholecalciferol (VITAMIN D3) 10 MCG (400 UNIT) CAPS Take 400 Units by mouth daily.   Yes [provider]  Cyanocobalamin (VITAMIN B 12 PO) Take 1,000 mcg by mouth daily.   Yes [provider]  diphenoxylate-atropine (LOMOTIL) 2.5-0.025 MG tablet TAKE 1 TABLET BY MOUTH 4 (FOUR) TIMES DAILY AS NEEDED FOR DIARRHEA OR LOOSE STOOLS. 08/02/21  Yes [name], MD  Empagliflozin-metFORMIN HCl ER (SYNJARDY XR) 12.12-998 MG TB24 TAKE 2 TABLETS BY MOUTH EVERY DAY Patient taking differently: Take 1 tablet by mouth in the morning and at bedtime. 04/29/21  Yes Elayne Snare, MD  ERLEADA 60 MG tablet Take 240 mg by mouth daily. 01/30/21  Yes [provider]  furosemide (LASIX) 20 MG tablet Take 1 tablet by mouth daily as needed Patient taking differently: Take 20-40 mg by mouth daily as needed for edema.  05/16/21  Yes [name], MD  glimepiride (AMARYL) 1 MG tablet TAKE 1 TABLET BY MOUTH EVERY DAY WITH BREAKFAST Patient taking differently: Take 1 mg by mouth at bedtime. 06/20/21  Yes Elayne Snare, MD  leuprolide (LUPRON) 11.25 MG injection Inject 11.25 mg into the muscle every 6 (six) months.   Yes [provider]  losartan (COZAAR) 50 MG tablet 1 TABLET IN THE MORNING AND HALF TABLET IN THE EVENING Patient taking differently: Take 50 mg by mouth in the morning. 06/28/21  Yes Elayne Snare, MD  metoprolol tartrate (LOPRESSOR) 100 MG tablet Take 1 tablet (100 mg total) by mouth 2 (two) times daily. Appointment Required For Further Refills [phone] 08/02/21  Yes Sherran Needs, NP  pantoprazole (PROTONIX) 40 MG tablet TAKE 1 TABLET BY MOUTH EVERY DAY Patient taking differently: Take 40 mg by mouth daily before breakfast. 01/08/21  Yes [name], MD   Semaglutide,0.25 or 0.5MG/DOS, (OZEMPIC, 0.25 OR 0.5 MG/DOSE,) 2 MG/1.5ML SOPN Inject 0.5 mg into the skin once a week. Patient taking differently: Inject 1 mg into the skin every Friday. 07/10/21  Yes Elayne Snare, MD  tamsulosin (FLOMAX) 0.4 MG CAPS capsule Take 1 capsule (0.4 mg total) by mouth daily. Patient taking differently: Take 0.4 mg by mouth 2 (two) times daily. 08/14/19  Yes Shawna Clamp, MD  warfarin (COUMADIN) 5 MG tablet TAKE 1-2 TABLETS DAILY OR AS PRESCRIBED BY COUMADIN CLINIC. Patient taking differently: Take 5-7.5 mg by mouth See admin instructions. Take 5 mg by mouth in the morning on Sun/Tues/Wed/Thurs/Sat and 7.5 mg on Mon/Fri 07/15/21  Yes Sherran Needs, NP  zinc gluconate 50 MG tablet Take 50 mg by mouth daily.   Yes [provider]  atorvastatin (LIPITOR) 40 MG tablet TAKE 1 TABLET BY MOUTH EVERY DAY Patient not taking: Reported on 09/07/2021 04/16/21   [name], MD  blood glucose meter kit and supplies KIT Test blood sugar daily as directed. Dx code: E11.9 01/23/15   Darlyne Russian, MD  docusate sodium (COLACE) 100 MG capsule Take 1 capsule (100 mg total) by mouth 2 (two) times daily. 10/06/20 10/06/21  Delight Hoh, MD  glucose blood (ACCU-CHEK GUIDE) test strip Use as instructed to check blood sugar twice daily. 10/29/18   Elayne Snare, MD  traMADol (ULTRAM) 50 MG tablet Take 1 tablet (50 mg total) by mouth every 6 (six) hours as needed for moderate pain or severe pain. Post-operatively Patient not taking: Reported on 09/07/2021 05/16/21 05/16/22  [name], MD    Inpatient Medications: Scheduled Meds:  apalutamide  240 mg Oral Daily   atorvastatin  80 mg Oral Daily   Chlorhexidine Gluconate Cloth  6 each Topical Daily   empagliflozin  25 mg Oral Q breakfast   And   metFORMIN  2,000 mg Oral Q breakfast   insulin aspart  0-9 Units Subcutaneous TID WC & HS   metoprolol tartrate  100 mg Oral BID   pantoprazole  40 mg Oral QAC breakfast   tamsulosin  0.4  mg Oral BID   warfarin  1 mg Oral ONCE-1600   Warfarin - Pharmacist Dosing Inpatient   Does not apply q1600   Continuous Infusions:  amiodarone 60 mg/hr (09/09/21 0724)   lactated ringers 100 mL/hr at 09/09/21 0723   PRN Meds: acetaminophen **OR** acetaminophen, ondansetron **OR** ondansetron (ZOFRAN) IV  Allergies:    Allergies  Allergen Reactions   Shingrix [Zoster Vac Recomb Adjuvanted] Shortness Of Breath and Other (See  Comments)    Pulse increased to 170+ and it affected the A-Fib   Other     shingls shot caused atrial fib did not take 2nd shot    Social History:   Social History   Socioeconomic History   Marital status: Married    Spouse name: Not on file   Number of children: 1   Years of education: Not on file   Highest education level: Not on file  Occupational History    Employer: RETIRED  Tobacco Use   Smoking status: Former    Packs/day: 0.50    Years: 23.00    Pack years: 11.50    Types: Cigarettes    Quit date: 08/01/2012    Years since quitting: 9.1   Smokeless tobacco: Never  Vaping Use   Vaping Use: Never used  Substance and Sexual Activity   Alcohol use: Not Currently    Comment: very seldom, maybe 6x per year   Drug use: Never   Sexual activity: Not Currently    Birth control/protection: Abstinence  Other Topics Concern   Not on file  Social History Narrative   Married, lives with spouse   1 child, lives in [place] with his wife and 3 children   Retired - Patent examiner   Does car models/replicas for a hobby, 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   [place] to [place] every year, last was June 2017   Social Determinants of Radio broadcast assistant Strain: Not on Art therapist Insecurity: Not on file  Transportation Needs: Not on file  Physical Activity: Not on file  Stress: Not on file  Social Connections: Not on file  Intimate Partner Violence: Not on file    Family History:    Family History  Problem Relation Age of Onset   Heart  disease Mother        before age 62   Diabetes Mother    Hyperlipidemia Mother    Varicose Veins Mother    Hypertension Mother    Heart disease Father    Diabetes Father    Hyperlipidemia Father    Hypertension Father    Cancer Brother    COPD Brother    Diabetes Brother    Hyperlipidemia Brother    Hypertension Brother      ROS:  Please see the history of present illness.   All other ROS reviewed and negative.     Physical Exam/Data:   Vitals:   09/09/21 0800 09/09/21 0907 09/09/21 1000 09/09/21 1100  BP: 98/74 105/71 109/69 102/67  Pulse: (!) 102 (!) 106 100 93  Resp: _0 Temp:      TempSrc:      SpO2: 97%  97% 99%  Weight:      Height:        Intake/Output Summary (Last 24 hours) at 09/09/2021 1125 Last data filed at 09/09/2021 0910 Gross per 24 hour  Intake 3656.05 ml  Output 475 ml  Net 3181.05 ml   Last 3 Weights 09/06/2021 07/10/2021 06/18/2021  Weight (lbs) 234 lb 259 lb 255 lb  Weight (kg) 106.142 kg 117.482 kg 115.667 kg     Body mass index is 32.64 kg/m.  General:  Well nourished, well developed, in no acute distress HEENT: normal Neck: no JVD Vascular: No carotid bruits; Distal pulses 2+ bilaterally Cardiac:  normal S1, S2; irregularly irregular; no murmur  Lungs:  clear to auscultation bilaterally, no wheezing, rhonchi or rales  Abd: soft, nontender,  no hepatomegaly  Ext: no edema Musculoskeletal:  No deformities, BUE and BLE strength normal and equal Skin: warm and dry  Neuro:  CNs 2-12 intact, no focal abnormalities noted Psych:  Normal affect   EKG:  The EKG was personally reviewed and demonstrates: Atrial fibrillation with RVR Telemetry:  Telemetry was personally reviewed and demonstrates: Atrial fibrillation, heart rate 80s to 120s overnight  Relevant CV Studies:  Echo 05/02/2019  1. The left ventricle has normal systolic function, with an ejection  fraction of 55-60%. The cavity size was normal. Left ventricular diastolic   Doppler parameters are indeterminate.   2. The right ventricle has normal systolic function. The cavity was  normal. There is no increase in right ventricular wall thickness.   3. Left atrial size was mildly dilated.   4. The mitral valve is grossly normal. Mild thickening of the mitral  valve leaflet.   5. The tricuspid valve is grossly normal.   6. Mild thickening of the aortic valve. Sclerosis without any evidence of  stenosis of the aortic valve.   7. The aorta is normal unless otherwise noted.   Laboratory Data:  High Sensitivity Troponin:  No results for input(s): TROPONINIHS in the last 720 hours.   Chemistry Recent Labs  Lab 09/07/21 0500 09/07/21 1909 09/08/21 0513  NA 136 137 137  K 3.6 4.0 4.5  CL 101 105 106  CO2 _0 GLUCOSE 163* 153* 159*  BUN _1 CREATININE 0.92 0.96 0.91  CALCIUM 7.9* 7.9* 7.7*  MG  --  1.7  --   GFRNONAA >60 >60 >60  ANIONGAP _2 Recent Labs  Lab 09/06/21 1553  PROT 6.9  ALBUMIN 2.5*  AST 20  ALT 14  ALKPHOS 83  BILITOT 1.0   Lipids No results for input(s): CHOL, TRIG, HDL, LABVLDL, LDLCALC, CHOLHDL in the last 168 hours.  Hematology Recent Labs  Lab 09/07/21 1909 09/08/21 0513 09/09/21 0441  WBC 7.9 7.1 6.1  RBC 3.22* 3.34* 3.08*  HGB 8.6* 8.6* 8.0*  HCT 28.0* 28.6* 26.6*  MCV 87.0 85.6 86.4  MCH 26.7 25.7* 26.0  MCHC 30.7 30.1 30.1  RDW 19.9* 19.9* 19.9*  PLT 247 250 224   Thyroid  Recent Labs  Lab 09/09/21 0441  TSH 1.574    BNP Recent Labs  Lab 09/06/21 1553  BNP 219.2*    DDimer No results for input(s): DDIMER in the last 168 hours.   Radiology/Studies:  DG Chest 2 View  Result Date: 09/06/2021 CLINICAL DATA:  Shortness of breath. EXAM: CHEST - 2 VIEW COMPARISON:  May 22, 2019. FINDINGS: The heart size and mediastinal contours are within normal limits. Left lung is clear. There appears to be interval development of right upper lobe opacity which may represent pneumonia, but  neoplasm cannot be excluded. The visualized skeletal structures are unremarkable. IMPRESSION: Interval development of right upper lobe opacity which may represent pneumonia, but neoplasm cannot be excluded. CT scan of the chest is recommended for further evaluation. Electronically Signed   By: Marijo Conception M.D.   On: 09/06/2021 15:32   CT Angio Chest PE W/Cm &/Or Wo Cm  Result Date: 09/06/2021 CLINICAL DATA:  Pulmonary embolism (PE) suspected, high prob EXAM: CT ANGIOGRAPHY CHEST WITH CONTRAST TECHNIQUE: Multidetector CT imaging of the chest was performed using the standard protocol during bolus administration of intravenous contrast. Multiplanar CT image reconstructions and MIPs were obtained to evaluate the vascular anatomy. CONTRAST:  30m OMNIPAQUE IOHEXOL 350 MG/ML SOLN COMPARISON:  2017 FINDINGS: Cardiovascular: Satisfactory opacification of the pulmonary arteries to the segmental level. No evidence of pulmonary embolism. Normal heart size. No pericardial effusion. Coronary artery calcification. Thoracic aorta is normal in caliber with mild calcified plaque. Mediastinum/Nodes: No enlarged nodes. Included thyroid is unremarkable. Esophagus is unremarkable. Lungs/Pleura: No pleural effusion or pneumothorax. Questioned right upper lobe opacity on radiograph is not identified by CT. Minimal patchy ground-glass density in both lungs. Upper Abdomen: Possible bilateral hydronephrosis. This was present on 2020 abdomen CT and is of unknown chronicity. Musculoskeletal: Osseous metastatic disease. No new compression deformity. Review of the MIP images confirms the above findings. IMPRESSION: No evidence of acute pulmonary embolism. Right upper lobe opacity on radiograph is not confirmed by CT. There is minimal patchy ground-glass density in both lungs that could reflect atelectasis or possibly early changes of a infectious/inflammatory process in the appropriate setting. Possible bilateral hydronephrosis. This was  present on 2020 abdomen CT and may be chronic or recurrent. Diffuse osseous metastatic disease. Coronary artery and aortic calcification. Electronically Signed   By: PMacy MisM.D.   On: 09/06/2021 18:04     Assessment and Plan:   Presyncope: -BP borderline low on arrival with tachycardia. -Orthostatic vital signs obtained on 1/7, systolic blood pressure did not change with body position change, however heart rate increased from 110 to 160 upon standing.  -Home losartan has been held.  Continue home metoprolol 100 mg twice a day.  IV amiodarone added more for rate control. -Pending echocardiogram.  Symptom appears to have improved on current addition of IV amiodarone.  IV amiodarone is multiple rate controlling rather than rhythm control.  Will discuss with MD, we will consider switching amiodarone to p.o.  Worsening dyspnea on exertion   -Obtain repeat echocardiogram.  Likely due to combination of radiation therapy, worsening anemia and tachycardia while in A. fib  Longstanding persistent atrial fibrillation  Anemia: Worsening anemia over the past few months.  Hemoglobin was 10.2 in September, now around 8.  Likely contributing to worsening dyspnea on exertion.  Metastatic prostate cancer with metastasis to the bone  DM2  History of DVT in 2015   Risk Assessment/Risk Scores:          CHA2DS2-VASc Score = 3   This indicates a 3.2% annual risk of stroke. The patient's score is based upon: CHF History: 0 HTN History: 1 Diabetes History: 1 Stroke History: 0 Vascular Disease History: 0 Age Score: 1 Gender Score: 0         For questions or updates, please contact CSimmsPlease consult [email or web address] for contact info under    SHilbert Corrigan PUtah 09/09/2021 11:25 AM

## 2021-09-09 NOTE — Progress Notes (Signed)
Echocardiogram 2D Echocardiogram has been performed.  Arlyss Gandy 09/09/2021, 3:15 PM

## 2021-09-09 NOTE — Plan of Care (Signed)

## 2021-09-09 NOTE — Progress Notes (Signed)
PROGRESS NOTE    Tommy Yang  EXH:[id number] DOB: [birth year] DOA: 09/06/2021 PCP: [name], MD    Brief Narrative:  71 year old male with a history of prostate cancer with bone mets, chronic atrial fibrillation, hypertension, diabetes, presents with dyspnea on exertion and near syncope while ambulating.  Noted to be in rapid atrial fibrillation on admission.  Admitted for further heart rate control.   Assessment & Plan:   Principal Problem:   Persistent atrial fibrillation Active Problems:   Chronic anticoagulation   Essential hypertension   Malignant neoplasm of prostate metastatic to bone [facility])   Peripheral edema   Near syncope   Diabetes mellitus type 2 in obese (HCC)   Atrial fibrillation with RVR (HCC)   Anemia   Chronic indwelling Foley catheter   Chronic atrial fibrillation without ventricular response -He is chronically on metoprolol 100 mg p.o. twice daily and has been continued on the same -He is also currently on amiodarone infusion -Overall heart rates are better than they were on admission, but still uncontrolled -Cardiology following, appreciate assistance -Plans to continue amiodarone for now with possible transition to p.o. tomorrow -Echocardiogram shows normal EF -TSH 1.54 -Urinalysis indicates pyuria, but has rare bacteria.  He does not have any other signs of UTI.  No fever or leukocytosis.  Suspect some degree of asymptomatic bacteriuria in a patient with chronic Foley. -Check electrolytes and keep potassium greater than 4, magnesium greater than 2 -Anticoagulated with Coumadin, INR therapeutic   Dyspnea on exertion/near syncope -I suspect this may be related to severe tachycardia when patient is ambulatory -He is anemic, but hemoglobin appears to be stable -Overall he does feel mildly better since admission   Hypertension -Blood pressure is low normal -Continued on metoprolol -Holding losartan   Prostate cancer with mets to  bone -Continue treatment per oncology/urology -Discussed with urology, Dr. Tresa Moore and will resume erleada on discharge  Chronic Foley catheter -Due to prostate issues -Continue while admitted   Lower extremity edema -May be cardiac in origin -Echo shows normal EF   Anemia -Denies any gross blood loss -Likely secondary to chronic disease related to his prostate cancer -Follow hemoglobin and transfuse for hemoglobin less than 8   Diabetes, type II -He is continued on his home medications of metformin and Jardiance -Continue on sliding scale insulin. -Blood sugars currently stable   DVT prophylaxis:   Coumadin  Code Status: Full code Family Communication: No family at bedside today  Disposition Plan: Status is: Inpatient  Remains inpatient appropriate because: Continued management of rapid atrial fibrillation with IV medications         Consultants:  Cardiology  Procedures:    Antimicrobials:      Subjective: No chest pain or shortness of breath  Objective: Vitals:   09/09/21 1619 09/09/21 1700 09/09/21 1800 09/09/21 2104  BP:  108/70 111/62   Pulse: 87 100 (!) 103   Resp: 12 (!) 27 17   Temp:    98.4 F (36.9 C)  TempSrc:    Oral  SpO2: 97% 90% 94%   Weight:      Height:        Intake/Output Summary (Last 24 hours) at 09/09/2021 2118 Last data filed at 09/09/2021 2015 Gross per 24 hour  Intake 4219.85 ml  Output 1350 ml  Net 2869.85 ml   Filed Weights   09/06/21 1438  Weight: 106.1 kg    Examination:  General exam: Appears calm and comfortable  Respiratory system: Clear to  auscultation. Respiratory effort normal. Cardiovascular system: S1 & S2 heard, irregular. No JVD, murmurs, rubs, gallops or clicks. No pedal edema. Gastrointestinal system: Abdomen is nondistended, soft and nontender. No organomegaly or masses felt. Normal bowel sounds heard. Central nervous system: Alert and oriented. No focal neurological deficits. Extremities:  Symmetric 5 x 5 power. Skin: No rashes, lesions or ulcers Psychiatry: Judgement and insight appear normal. Mood & affect appropriate.     Data Reviewed: I have personally reviewed following labs and imaging studies  CBC: Recent Labs  Lab 09/06/21 1553 09/07/21 0500 09/07/21 1909 09/08/21 0513 09/09/21 0441  WBC 7.6 6.6 7.9 7.1 6.1  NEUTROABS 6.0  --   --   --   --   HGB 8.9* 8.4* 8.6* 8.6* 8.0*  HCT 28.6* 27.7* 28.0* 28.6* 26.6*  MCV 83.9 87.1 87.0 85.6 86.4  PLT 264 228 247 250 629   Basic Metabolic Panel: Recent Labs  Lab 09/06/21 1553 09/07/21 0500 09/07/21 1909 09/08/21 0513  NA 137 136 137 137  K 4.0 3.6 4.0 4.5  CL 103 101 105 106  CO2 21* 22 22 23   GLUCOSE 164* 163* 153* 159*  BUN 26* 23 19 16   CREATININE 1.17 0.92 0.96 0.91  CALCIUM 8.3* 7.9* 7.9* 7.7*  MG  --   --  1.7  --    GFR: Estimated Creatinine Clearance: 96.3 mL/min (by C-G formula based on SCr of 0.91 mg/dL). Liver Function Tests: Recent Labs  Lab 09/06/21 1553  AST 20  ALT 14  ALKPHOS 83  BILITOT 1.0  PROT 6.9  ALBUMIN 2.5*   No results for input(s): LIPASE, AMYLASE in the last 168 hours. No results for input(s): AMMONIA in the last 168 hours. Coagulation Profile: Recent Labs  Lab 09/03/21 0848 09/06/21 1553 09/07/21 0500 09/08/21 0513 09/09/21 0441  INR 1.2* 2.8* 3.0* 3.8* 3.1*   Cardiac Enzymes: No results for input(s): CKTOTAL, CKMB, CKMBINDEX, TROPONINI in the last 168 hours. BNP (last 3 results) No results for input(s): PROBNP in the last 8760 hours. HbA1C: Recent Labs    09/07/21 0430  HGBA1C 7.0*   CBG: Recent Labs  Lab 09/08/21 1931 09/09/21 0735 09/09/21 1114 09/09/21 1612 09/09/21 2103  GLUCAP 171* 134* 161* 163* 156*   Lipid Profile: No results for input(s): CHOL, HDL, LDLCALC, TRIG, CHOLHDL, LDLDIRECT in the last 72 hours. Thyroid Function Tests: Recent Labs    09/09/21 0441  TSH 1.574   Anemia Panel: No results for input(s): VITAMINB12,  FOLATE, FERRITIN, TIBC, IRON, RETICCTPCT in the last 72 hours. Sepsis Labs: No results for input(s): PROCALCITON, LATICACIDVEN in the last 168 hours.  Recent Results (from the past 240 hour(s))  Resp Panel by RT-PCR (Flu A&B, Covid) Nasopharyngeal Swab     Status: None   Collection Time: 09/07/21  6:39 AM   Specimen: Nasopharyngeal Swab; Nasopharyngeal(NP) swabs in vial transport medium  Result Value Ref Range Status   SARS Coronavirus 2 by RT PCR NEGATIVE NEGATIVE Final    Comment: (NOTE) SARS-CoV-2 target nucleic acids are NOT DETECTED.  The SARS-CoV-2 RNA is generally detectable in upper respiratory specimens during the acute phase of infection. The lowest concentration of SARS-CoV-2 viral copies this assay can detect is 138 copies/mL. A negative result does not preclude SARS-Cov-2 infection and should not be used as the sole basis for treatment or other patient management decisions. A negative result may occur with  improper specimen collection/handling, submission of specimen other than nasopharyngeal swab, presence of viral mutation(s) within the  areas targeted by this assay, and inadequate number of viral copies(<138 copies/mL). A negative result must be combined with clinical observations, patient history, and epidemiological information. The expected result is Negative.  Fact Sheet for Patients:  EntrepreneurPulse.com.au  Fact Sheet for Healthcare Providers:  IncredibleEmployment.be  This test is no t yet approved or cleared by the [place] FDA and  has been authorized for detection and/or diagnosis of SARS-CoV-2 by FDA under an Emergency Use Authorization (EUA). This EUA will remain  in effect (meaning this test can be used) for the duration of the COVID-19 declaration under Section 564(b)(1) of the Act, 21 U.S.C.section 360bbb-3(b)(1), unless the authorization is terminated  or revoked sooner.       Influenza A by PCR  NEGATIVE NEGATIVE Final   Influenza B by PCR NEGATIVE NEGATIVE Final    Comment: (NOTE) The Xpert Xpress SARS-CoV-2/FLU/RSV plus assay is intended as an aid in the diagnosis of influenza from Nasopharyngeal swab specimens and should not be used as a sole basis for treatment. Nasal washings and aspirates are unacceptable for Xpert Xpress SARS-CoV-2/FLU/RSV testing.  Fact Sheet for Patients: EntrepreneurPulse.com.au  Fact Sheet for Healthcare Providers: IncredibleEmployment.be  This test is not yet approved or cleared by the [place] FDA and has been authorized for detection and/or diagnosis of SARS-CoV-2 by FDA under an Emergency Use Authorization (EUA). This EUA will remain in effect (meaning this test can be used) for the duration of the COVID-19 declaration under Section 564(b)(1) of the Act, 21 U.S.C. section 360bbb-3(b)(1), unless the authorization is terminated or revoked.  Performed at [facility], [place] [address], [place], [place] [address]          Radiology Studies: ECHOCARDIOGRAM COMPLETE  Result Date: 09/09/2021    ECHOCARDIOGRAM REPORT   Patient Name:   AYDIN HINK Date of Exam: 09/09/2021 Medical Rec #:  [id number]          Height:       71.0 in Accession #:    [id number]         Weight:       234.0 lb Date of Birth:  June 21, 1951          BSA:          2.254 m Patient Age:    66 years           BP:           100/64 mmHg Patient Gender: M                  HR:           78 bpm. Exam Location:  Inpatient Procedure: 2D Echo Indications:    Dyspnea  History:        Patient has prior history of Echocardiogram examinations, most                 recent 05/02/2019. Arrythmias:Atrial Fibrillation; Risk                 Factors:Hypertension and Diabetes.  Sonographer:    Arlyss Gandy Referring Phys: [phone] HAO MENG IMPRESSIONS  1. Left ventricular ejection fraction, by estimation, is 60 to 65%. The left ventricle has  normal function. The left ventricle has no regional wall motion abnormalities. There is mild left ventricular hypertrophy. Left ventricular diastolic parameters are indeterminate.  2. Right ventricular systolic function is normal. The right ventricular size is normal. There is normal pulmonary artery systolic pressure. The estimated right ventricular  systolic pressure is 44.8 mmHg.  3. Right atrial size was mildly dilated.  4. The mitral valve is normal in structure. Trivial mitral valve regurgitation.  5. The aortic valve is tricuspid. Aortic valve regurgitation is not visualized. Aortic valve sclerosis is present, with no evidence of aortic valve stenosis.  6. The inferior vena cava is dilated in size with >50% respiratory variability, suggesting right atrial pressure of 8 mmHg. FINDINGS  Left Ventricle: Left ventricular ejection fraction, by estimation, is 60 to 65%. The left ventricle has normal function. The left ventricle has no regional wall motion abnormalities. The left ventricular internal cavity size was normal in size. There is  mild left ventricular hypertrophy. Left ventricular diastolic parameters are indeterminate. Right Ventricle: The right ventricular size is normal. Right vetricular wall thickness was not well visualized. Right ventricular systolic function is normal. There is normal pulmonary artery systolic pressure. The tricuspid regurgitant velocity is 2.31 m/s, and with an assumed right atrial pressure of 8 mmHg, the estimated right ventricular systolic pressure is 18.5 mmHg. Left Atrium: Left atrial size was normal in size. Right Atrium: Right atrial size was mildly dilated. Pericardium: Trivial pericardial effusion is present. Mitral Valve: The mitral valve is normal in structure. Trivial mitral valve regurgitation. Tricuspid Valve: The tricuspid valve is normal in structure. Tricuspid valve regurgitation is trivial. Aortic Valve: The aortic valve is tricuspid. Aortic valve regurgitation is  not visualized. Aortic valve sclerosis is present, with no evidence of aortic valve stenosis. Aortic valve mean gradient measures 4.3 mmHg. Aortic valve peak gradient measures 7.8  mmHg. Aortic valve area, by VTI measures 2.54 cm. Pulmonic Valve: The pulmonic valve was not well visualized. Pulmonic valve regurgitation is not visualized. Aorta: The aortic root and ascending aorta are structurally normal, with no evidence of dilitation. Venous: The inferior vena cava is dilated in size with greater than 50% respiratory variability, suggesting right atrial pressure of 8 mmHg. IAS/Shunts: No atrial level shunt detected by color flow Doppler.  LEFT VENTRICLE PLAX 2D LVIDd:         5.00 cm   Diastology LVIDs:         3.30 cm   LV e' medial:    10.60 cm/s LV PW:         1.10 cm   LV E/e' medial:  9.3 LV IVS:        0.90 cm   LV e' lateral:   9.25 cm/s LVOT diam:     2.20 cm   LV E/e' lateral: 10.6 LV SV:         66 LV SV Index:   29 LVOT Area:     3.80 cm  RIGHT VENTRICLE             IVC RV S prime:     12.70 cm/s  IVC diam: 2.80 cm TAPSE (M-mode): 1.8 cm LEFT ATRIUM             Index LA diam:        4.80 cm 2.13 cm/m LA Vol (A2C):   61.4 ml 27.24 ml/m LA Vol (A4C):   66.2 ml 29.37 ml/m LA Biplane Vol: 64.0 ml 28.39 ml/m  AORTIC VALVE AV Area (Vmax):    2.46 cm AV Area (Vmean):   2.39 cm AV Area (VTI):     2.54 cm AV Vmax:           139.67 cm/s AV Vmean:          96.500 cm/s AV  VTI:            0.261 m AV Peak Grad:      7.8 mmHg AV Mean Grad:      4.3 mmHg LVOT Vmax:         90.27 cm/s LVOT Vmean:        60.767 cm/s LVOT VTI:          0.174 m LVOT/AV VTI ratio: 0.67  AORTA Ao Root diam: 3.40 cm Ao Asc diam:  3.50 cm MITRAL VALVE               TRICUSPID VALVE MV Area (PHT): 4.06 cm    TR Peak grad:   21.3 mmHg MV Decel Time: 187 msec    TR Vmax:        231.00 cm/s MV E velocity: 98.50 cm/s                            SHUNTS                            Systemic VTI:  0.17 m                            Systemic Diam:  2.20 cm Oswaldo Milian MD Electronically signed by Oswaldo Milian MD Signature Date/Time: 09/09/2021/7:18:44 PM    Final         Scheduled Meds:  atorvastatin  80 mg Oral Daily   Chlorhexidine Gluconate Cloth  6 each Topical Daily   empagliflozin  25 mg Oral Q breakfast   And   metFORMIN  2,000 mg Oral Q breakfast   insulin aspart  0-9 Units Subcutaneous TID WC & HS   metoprolol tartrate  100 mg Oral BID   pantoprazole  40 mg Oral QAC breakfast   tamsulosin  0.4 mg Oral BID   Warfarin - Pharmacist Dosing Inpatient   Does not apply q1600   Continuous Infusions:  amiodarone 60 mg/hr (09/09/21 1813)   lactated ringers 100 mL/hr at 09/09/21 1629     LOS: 1 day    Time spent: 54mins    Kathie Dike, MD [facility]   If 7PM-7AM, please contact night-coverage [email or web address]  09/09/2021, 9:18 PM

## 2021-09-10 DIAGNOSIS — Z7901 Long term (current) use of anticoagulants: Secondary | ICD-10-CM | POA: Diagnosis not present

## 2021-09-10 DIAGNOSIS — E669 Obesity, unspecified: Secondary | ICD-10-CM

## 2021-09-10 DIAGNOSIS — E1169 Type 2 diabetes mellitus with other specified complication: Secondary | ICD-10-CM

## 2021-09-10 DIAGNOSIS — C61 Malignant neoplasm of prostate: Secondary | ICD-10-CM | POA: Diagnosis not present

## 2021-09-10 DIAGNOSIS — I4819 Other persistent atrial fibrillation: Secondary | ICD-10-CM | POA: Diagnosis not present

## 2021-09-10 LAB — CBC
HCT: 26.9 % — ABNORMAL LOW (ref 39.0–52.0)
Hemoglobin: 8.2 g/dL — ABNORMAL LOW (ref 13.0–17.0)
MCH: 26.5 pg (ref 26.0–34.0)
MCHC: 30.5 g/dL (ref 30.0–36.0)
MCV: 86.8 fL (ref 80.0–100.0)
Platelets: 215 10*3/uL (ref 150–400)
RBC: 3.1 MIL/uL — ABNORMAL LOW (ref 4.22–5.81)
RDW: 19.9 % — ABNORMAL HIGH (ref 11.5–15.5)
WBC: 6 10*3/uL (ref 4.0–10.5)
nRBC: 0 % (ref 0.0–0.2)

## 2021-09-10 LAB — BASIC METABOLIC PANEL
Anion gap: 7 (ref 5–15)
BUN: 15 mg/dL (ref 8–23)
CO2: 22 mmol/L (ref 22–32)
Calcium: 7.7 mg/dL — ABNORMAL LOW (ref 8.9–10.3)
Chloride: 109 mmol/L (ref 98–111)
Creatinine, Ser: 0.88 mg/dL (ref 0.61–1.24)
GFR, Estimated: >60 mL/min (ref >60)
Glucose, Bld: 145 mg/dL — ABNORMAL HIGH (ref 70–99)
Potassium: 4 mmol/L (ref 3.5–5.1)
Sodium: 138 mmol/L (ref 135–145)

## 2021-09-10 LAB — GLUCOSE, CAPILLARY
Glucose-Capillary: 148 mg/dL — ABNORMAL HIGH (ref 70–99)
Glucose-Capillary: 127 mg/dL — ABNORMAL HIGH (ref 70–99)
Glucose-Capillary: 141 mg/dL — ABNORMAL HIGH (ref 70–99)
Glucose-Capillary: 198 mg/dL — ABNORMAL HIGH (ref 70–99)

## 2021-09-10 LAB — ABO/RH: ABO/RH(D): A NEG

## 2021-09-10 LAB — PROTIME-INR
INR: 2.7 — ABNORMAL HIGH (ref 0.8–1.2)
Prothrombin Time: 28.9 seconds — ABNORMAL HIGH (ref 11.4–15.2)

## 2021-09-10 LAB — PREPARE RBC (CROSSMATCH)

## 2021-09-10 MED ORDER — FUROSEMIDE 40 MG PO TABS
40.0000 mg | ORAL_TABLET | Freq: Every day | ORAL | Status: DC
  Administered 2021-09-11 – 2021-09-12 (×2): 40 mg via ORAL
  Filled 2021-09-10 (×2): qty 1

## 2021-09-10 MED ORDER — AMIODARONE HCL 200 MG PO TABS
400.0000 mg | ORAL_TABLET | Freq: Two times a day (BID) | ORAL | Status: DC
  Administered 2021-09-10 – 2021-09-12 (×5): 400 mg via ORAL
  Filled 2021-09-10 (×5): qty 2

## 2021-09-10 MED ORDER — WARFARIN SODIUM 2.5 MG PO TABS
2.5000 mg | ORAL_TABLET | Freq: Once | ORAL | Status: AC
  Administered 2021-09-10: 2.5 mg via ORAL
  Filled 2021-09-10: qty 1

## 2021-09-10 MED ORDER — SODIUM CHLORIDE 0.9% IV SOLUTION: Freq: Once | INTRAVENOUS | Status: AC

## 2021-09-10 MED ORDER — AMIODARONE HCL 200 MG PO TABS: 200.0000 mg | ORAL_TABLET | Freq: Every day | ORAL | Status: DC

## 2021-09-10 NOTE — Plan of Care (Signed)

## 2021-09-10 NOTE — Progress Notes (Signed)
PROGRESS NOTE    Tommy Yang  YIR:[id number] DOB: Jun 20, 1951 DOA: 09/06/2021 PCP: [name], MD    Brief Narrative:  71 year old male with a history of prostate cancer with bone mets, chronic atrial fibrillation, hypertension, diabetes, presents with dyspnea on exertion and near syncope while ambulating.  Noted to be in rapid atrial fibrillation on admission.  Admitted for further heart rate control.   Assessment & Plan:   Principal Problem:   Persistent atrial fibrillation Active Problems:   Chronic anticoagulation   Essential hypertension   Malignant neoplasm of prostate metastatic to bone [facility])   Peripheral edema   Near syncope   Diabetes mellitus type 2 in obese (HCC)   Atrial fibrillation with RVR (HCC)   Anemia   Chronic indwelling Foley catheter   Chronic atrial fibrillation without ventricular response -He is chronically on metoprolol 100 mg p.o. twice daily and has been continued on the same -He was treated with amiodarone infusion, transition to oral amiodarone today -Overall heart rates are better than they were on admission, but still uncontrolled -Cardiology following, appreciate assistance -Echocardiogram shows normal EF -TSH 1.54 -Urinalysis indicates pyuria, but has rare bacteria.  He does not have any other signs of UTI.  No fever or leukocytosis.  Suspect some degree of asymptomatic bacteriuria in a patient with chronic Foley. -Check electrolytes and keep potassium greater than 4, magnesium greater than 2 -Anticoagulated with Coumadin, INR therapeutic -We will have patient ambulate and monitor his heart rate for extreme tachycardia/palpitations   Dyspnea on exertion/near syncope -I suspect this may be related to severe tachycardia when patient is ambulatory -Overall he does feel mildly better since admission   Hypertension -Blood pressure is low normal -Continued on metoprolol -Holding losartan to allow more heart rate control   Prostate  cancer with mets to bone -Continue treatment per oncology/urology -Discussed with urology, Dr. Tresa Moore and will resume erleada on discharge  Chronic Foley catheter -Due to prostate issues -Continue while admitted   Lower extremity edema -May be cardiac in origin -Echo shows normal EF -Since he is developing lower extremity edema, will resume on Lasix   Anemia -Baseline hemoglobin appears to be between 10-11 -Admission hemoglobin noted to be 8.9, down to 8.2 today -Denies any gross blood loss -Likely secondary to chronic disease related to his prostate cancer -Since patient is still becoming significantly tachycardic on ambulation, suspect his anemia may be playing some part in that. -We will transfuse 1 unit PRBC   Diabetes, type II -He is continued on his home medications of metformin and Jardiance -Continue on sliding scale insulin. -Blood sugars currently stable   DVT prophylaxis:   Coumadin  Code Status: Full code Family Communication: Updated patient's wife at the bedside Disposition Plan: Status is: Inpatient  Remains inpatient appropriate because: Continued management of rapid atrial fibrillation with IV medications         Consultants:  Cardiology  Procedures:  Echo  Antimicrobials:      Subjective: At rest, he has not had any chest pain or shortness of breath.  Reports that he is not really ambulated a significant distance as of yet.  Objective: Vitals:   09/10/21 1700 09/10/21 1739 09/10/21 1813 09/10/21 1822  BP: 115/77     Pulse: (!) 102 100    Resp: 17 (!) 22 (!) 36 (!) 23  Temp:      TempSrc:      SpO2: 97% 95%    Weight:      Height:  Intake/Output Summary (Last 24 hours) at 09/10/2021 2107 Last data filed at 09/10/2021 1800 Gross per 24 hour  Intake 2354.05 ml  Output 2100 ml  Net 254.05 ml   Filed Weights   09/06/21 1438  Weight: 106.1 kg    Examination:  General exam: Alert, awake, oriented x 3 Respiratory system:  Clear to auscultation. Respiratory effort normal. Cardiovascular system: S1, S2 irregular no murmurs, rubs, gallops. Gastrointestinal system: Abdomen is nondistended, soft and nontender. No organomegaly or masses felt. Normal bowel sounds heard. Central nervous system: Alert and oriented. No focal neurological deficits. Extremities: No C/C/E, +pedal pulses Skin: No rashes, lesions or ulcers Psychiatry: Judgement and insight appear normal. Mood & affect appropriate.      Data Reviewed: I have personally reviewed following labs and imaging studies  CBC: Recent Labs  Lab 09/06/21 1553 09/07/21 0500 09/07/21 1909 09/08/21 0513 09/09/21 0441 09/10/21 0417  WBC 7.6 6.6 7.9 7.1 6.1 6.0  NEUTROABS 6.0  --   --   --   --   --   HGB 8.9* 8.4* 8.6* 8.6* 8.0* 8.2*  HCT 28.6* 27.7* 28.0* 28.6* 26.6* 26.9*  MCV 83.9 87.1 87.0 85.6 86.4 86.8  PLT 264 228 247 250 224 622   Basic Metabolic Panel: Recent Labs  Lab 09/06/21 1553 09/07/21 0500 09/07/21 1909 09/08/21 0513 09/10/21 0417  NA 137 136 137 137 138  K 4.0 3.6 4.0 4.5 4.0  CL 103 101 105 106 109  CO2 21* 22 22 23 22   GLUCOSE 164* 163* 153* 159* 145*  BUN 26* 23 19 16 15   CREATININE 1.17 0.92 0.96 0.91 0.88  CALCIUM 8.3* 7.9* 7.9* 7.7* 7.7*  MG  --   --  1.7  --   --    GFR: Estimated Creatinine Clearance: 99.5 mL/min (by C-G formula based on SCr of 0.88 mg/dL). Liver Function Tests: Recent Labs  Lab 09/06/21 1553  AST 20  ALT 14  ALKPHOS 83  BILITOT 1.0  PROT 6.9  ALBUMIN 2.5*   No results for input(s): LIPASE, AMYLASE in the last 168 hours. No results for input(s): AMMONIA in the last 168 hours. Coagulation Profile: Recent Labs  Lab 09/06/21 1553 09/07/21 0500 09/08/21 0513 09/09/21 0441 09/10/21 0644  INR 2.8* 3.0* 3.8* 3.1* 2.7*   Cardiac Enzymes: No results for input(s): CKTOTAL, CKMB, CKMBINDEX, TROPONINI in the last 168 hours. BNP (last 3 results) No results for input(s): PROBNP in the last 8760  hours. HbA1C: No results for input(s): HGBA1C in the last 72 hours.  CBG: Recent Labs  Lab 09/09/21 1612 09/09/21 2103 09/10/21 0743 09/10/21 1133 09/10/21 1655  GLUCAP 163* 156* 148* 127* 141*   Lipid Profile: No results for input(s): CHOL, HDL, LDLCALC, TRIG, CHOLHDL, LDLDIRECT in the last 72 hours. Thyroid Function Tests: Recent Labs    09/09/21 0441  TSH 1.574   Anemia Panel: No results for input(s): VITAMINB12, FOLATE, FERRITIN, TIBC, IRON, RETICCTPCT in the last 72 hours. Sepsis Labs: No results for input(s): PROCALCITON, LATICACIDVEN in the last 168 hours.  Recent Results (from the past 240 hour(s))  Resp Panel by RT-PCR (Flu A&B, Covid) Nasopharyngeal Swab     Status: None   Collection Time: 09/07/21  6:39 AM   Specimen: Nasopharyngeal Swab; Nasopharyngeal(NP) swabs in vial transport medium  Result Value Ref Range Status   SARS Coronavirus 2 by RT PCR NEGATIVE NEGATIVE Final    Comment: (NOTE) SARS-CoV-2 target nucleic acids are NOT DETECTED.  The SARS-CoV-2 RNA is generally  detectable in upper respiratory specimens during the acute phase of infection. The lowest concentration of SARS-CoV-2 viral copies this assay can detect is 138 copies/mL. A negative result does not preclude SARS-Cov-2 infection and should not be used as the sole basis for treatment or other patient management decisions. A negative result may occur with  improper specimen collection/handling, submission of specimen other than nasopharyngeal swab, presence of viral mutation(s) within the areas targeted by this assay, and inadequate number of viral copies(<138 copies/mL). A negative result must be combined with clinical observations, patient history, and epidemiological information. The expected result is Negative.  Fact Sheet for Patients:  EntrepreneurPulse.com.au  Fact Sheet for Healthcare Providers:  IncredibleEmployment.be  This test is no t yet  approved or cleared by the [place] FDA and  has been authorized for detection and/or diagnosis of SARS-CoV-2 by FDA under an Emergency Use Authorization (EUA). This EUA will remain  in effect (meaning this test can be used) for the duration of the COVID-19 declaration under Section 564(b)(1) of the Act, 21 U.S.C.section 360bbb-3(b)(1), unless the authorization is terminated  or revoked sooner.       Influenza A by PCR NEGATIVE NEGATIVE Final   Influenza B by PCR NEGATIVE NEGATIVE Final    Comment: (NOTE) The Xpert Xpress SARS-CoV-2/FLU/RSV plus assay is intended as an aid in the diagnosis of influenza from Nasopharyngeal swab specimens and should not be used as a sole basis for treatment. Nasal washings and aspirates are unacceptable for Xpert Xpress SARS-CoV-2/FLU/RSV testing.  Fact Sheet for Patients: EntrepreneurPulse.com.au  Fact Sheet for Healthcare Providers: IncredibleEmployment.be  This test is not yet approved or cleared by the [place] FDA and has been authorized for detection and/or diagnosis of SARS-CoV-2 by FDA under an Emergency Use Authorization (EUA). This EUA will remain in effect (meaning this test can be used) for the duration of the COVID-19 declaration under Section 564(b)(1) of the Act, 21 U.S.C. section 360bbb-3(b)(1), unless the authorization is terminated or revoked.  Performed at [facility], [place] [address] [place], [place] [address]          Radiology Studies: ECHOCARDIOGRAM COMPLETE  Result Date: 09/09/2021    ECHOCARDIOGRAM REPORT   Patient Name:   Tommy Yang Date of Exam: 09/09/2021 Medical Rec #:  [id number]          Height:       71.0 in Accession #:    [id number]         Weight:       234.0 lb Date of Birth:  1951-05-28          BSA:          2.254 m Patient Age:    85 years           BP:           100/64 mmHg Patient Gender: M                  HR:           78 bpm.  Exam Location:  Inpatient Procedure: 2D Echo Indications:    Dyspnea  History:        Patient has prior history of Echocardiogram examinations, most                 recent 05/02/2019. Arrythmias:Atrial Fibrillation; Risk                 Factors:Hypertension and Diabetes.  Sonographer:  Arlyss Gandy Referring Phys: [id number] HAO MENG IMPRESSIONS  1. Left ventricular ejection fraction, by estimation, is 60 to 65%. The left ventricle has normal function. The left ventricle has no regional wall motion abnormalities. There is mild left ventricular hypertrophy. Left ventricular diastolic parameters are indeterminate.  2. Right ventricular systolic function is normal. The right ventricular size is normal. There is normal pulmonary artery systolic pressure. The estimated right ventricular systolic pressure is 96.2 mmHg.  3. Right atrial size was mildly dilated.  4. The mitral valve is normal in structure. Trivial mitral valve regurgitation.  5. The aortic valve is tricuspid. Aortic valve regurgitation is not visualized. Aortic valve sclerosis is present, with no evidence of aortic valve stenosis.  6. The inferior vena cava is dilated in size with >50% respiratory variability, suggesting right atrial pressure of 8 mmHg. FINDINGS  Left Ventricle: Left ventricular ejection fraction, by estimation, is 60 to 65%. The left ventricle has normal function. The left ventricle has no regional wall motion abnormalities. The left ventricular internal cavity size was normal in size. There is  mild left ventricular hypertrophy. Left ventricular diastolic parameters are indeterminate. Right Ventricle: The right ventricular size is normal. Right vetricular wall thickness was not well visualized. Right ventricular systolic function is normal. There is normal pulmonary artery systolic pressure. The tricuspid regurgitant velocity is 2.31 m/s, and with an assumed right atrial pressure of 8 mmHg, the estimated right ventricular systolic pressure  is 22.9 mmHg. Left Atrium: Left atrial size was normal in size. Right Atrium: Right atrial size was mildly dilated. Pericardium: Trivial pericardial effusion is present. Mitral Valve: The mitral valve is normal in structure. Trivial mitral valve regurgitation. Tricuspid Valve: The tricuspid valve is normal in structure. Tricuspid valve regurgitation is trivial. Aortic Valve: The aortic valve is tricuspid. Aortic valve regurgitation is not visualized. Aortic valve sclerosis is present, with no evidence of aortic valve stenosis. Aortic valve mean gradient measures 4.3 mmHg. Aortic valve peak gradient measures 7.8  mmHg. Aortic valve area, by VTI measures 2.54 cm. Pulmonic Valve: The pulmonic valve was not well visualized. Pulmonic valve regurgitation is not visualized. Aorta: The aortic root and ascending aorta are structurally normal, with no evidence of dilitation. Venous: The inferior vena cava is dilated in size with greater than 50% respiratory variability, suggesting right atrial pressure of 8 mmHg. IAS/Shunts: No atrial level shunt detected by color flow Doppler.  LEFT VENTRICLE PLAX 2D LVIDd:         5.00 cm   Diastology LVIDs:         3.30 cm   LV e' medial:    10.60 cm/s LV PW:         1.10 cm   LV E/e' medial:  9.3 LV IVS:        0.90 cm   LV e' lateral:   9.25 cm/s LVOT diam:     2.20 cm   LV E/e' lateral: 10.6 LV SV:         66 LV SV Index:   29 LVOT Area:     3.80 cm  RIGHT VENTRICLE             IVC RV S prime:     12.70 cm/s  IVC diam: 2.80 cm TAPSE (M-mode): 1.8 cm LEFT ATRIUM             Index LA diam:        4.80 cm 2.13 cm/m LA Vol (A2C):   61.4 ml 27.24  ml/m LA Vol (A4C):   66.2 ml 29.37 ml/m LA Biplane Vol: 64.0 ml 28.39 ml/m  AORTIC VALVE AV Area (Vmax):    2.46 cm AV Area (Vmean):   2.39 cm AV Area (VTI):     2.54 cm AV Vmax:           139.67 cm/s AV Vmean:          96.500 cm/s AV VTI:            0.261 m AV Peak Grad:      7.8 mmHg AV Mean Grad:      4.3 mmHg LVOT Vmax:         90.27  cm/s LVOT Vmean:        60.767 cm/s LVOT VTI:          0.174 m LVOT/AV VTI ratio: 0.67  AORTA Ao Root diam: 3.40 cm Ao Asc diam:  3.50 cm MITRAL VALVE               TRICUSPID VALVE MV Area (PHT): 4.06 cm    TR Peak grad:   21.3 mmHg MV Decel Time: 187 msec    TR Vmax:        231.00 cm/s MV E velocity: 98.50 cm/s                            SHUNTS                            Systemic VTI:  0.17 m                            Systemic Diam: 2.20 cm Oswaldo Milian MD Electronically signed by Oswaldo Milian MD Signature Date/Time: 09/09/2021/7:18:44 PM    Final         Scheduled Meds:  sodium chloride   Intravenous Once   amiodarone  400 mg Oral BID   Followed by   Derrill Memo ON 09/24/2021] amiodarone  200 mg Oral Daily   atorvastatin  80 mg Oral Daily   Chlorhexidine Gluconate Cloth  6 each Topical Daily   empagliflozin  25 mg Oral Q breakfast   And   metFORMIN  2,000 mg Oral Q breakfast   insulin aspart  0-9 Units Subcutaneous TID WC & HS   metoprolol tartrate  100 mg Oral BID   pantoprazole  40 mg Oral QAC breakfast   tamsulosin  0.4 mg Oral BID   Warfarin - Pharmacist Dosing Inpatient   Does not apply q1600   Continuous Infusions:     LOS: 2 days    Time spent: 75mins    Kathie Dike, MD [facility]   If 7PM-7AM, please contact night-coverage [email or web address]  09/10/2021, 9:07 PM

## 2021-09-10 NOTE — Progress Notes (Signed)
ANTICOAGULATION CONSULT NOTE - Follow Up Consult  Pharmacy Consult for warfarin Indication: hx atrial fibrillation and DVT  Allergies  Allergen Reactions   Shingrix [Zoster Vac Recomb Adjuvanted] Shortness Of Breath and Other (See Comments)    Pulse increased to 170+ and it affected the A-Fib   Other     shingls shot caused atrial fib did not take 2nd shot    Patient Measurements: Height: 5\' 11"  (180.3 cm) Weight: 106.1 kg (234 lb) IBW/kg (Calculated) : 75.3 Heparin Dosing Weight:   Vital Signs: Temp: 97.5 F (36.4 C) (01/10 0353) Temp Source: Oral (01/10 0353) BP: 133/68 (01/10 0353) Pulse Rate: 102 (01/10 0353)  Labs: Recent Labs    09/07/21 1909 09/08/21 0513 09/09/21 0441 09/10/21 0417  HGB 8.6* 8.6* 8.0* 8.2*  HCT 28.0* 28.6* 26.6* 26.9*  PLT 247 250 224 215  LABPROT  --  37.6* 31.7*  --   INR  --  3.8* 3.1*  --   CREATININE 0.96 0.91  --  0.88     Estimated Creatinine Clearance: 99.5 mL/min (by C-G formula based on SCr of 0.88 mg/dL).   Medications:  The following information obtained from [facility] clinic notes: -Home dose: Warfarin 7.5 mg M,F; 5 mg all other days of the week -Dose recently reduced on 09/03/21 after a supratherapeutic INR reading of >10 on 08/30/21 while taking 7.5 mg daily  Assessment: Patient is a 71 y.o M with hx metastatic cancer, DVT and afib on warfarin PTA, presented to the ED on 09/06/21 with c/o SOB and dizziness. Chest CT on 09/06/21 was negative for PE.  Warfarin resumed on admission.  Today, 09/10/2021: - INR is therapeutic at 2.7 - cbc somewhat stable - no bleeding documented - signif drug-drug intxns: amiodarone can increase INR (amio drip started on 09/07/21)   Goal of Therapy:  INR 2-3 Monitor platelets by anticoagulation protocol: Yes   Plan:  - warfarin 2.5 mg PO x1 today - daily INR - monitor for s/sx bleeding  Larcenia Holaday P 09/10/2021,7:34 AM

## 2021-09-10 NOTE — Progress Notes (Signed)
Pt ambulated approx. 50 feet to the nurses station and back. Pt denied SOB or dizziness, but said could feel his heart rate rising. Pt max HR was 156 while ambulating. Pt had no complaints and was put in the chair per pt request. MD notified of ambulation and HR.

## 2021-09-10 NOTE — Progress Notes (Signed)
Progress Note  Patient Name: Tommy Yang Date of Encounter: 09/10/2021  [facility] Cardiologist: Pixie Casino, MD   Subjective   Pt feels much better on amiodarone and off of radium.  Inpatient Medications    Scheduled Meds:  atorvastatin  80 mg Oral Daily   Chlorhexidine Gluconate Cloth  6 each Topical Daily   empagliflozin  25 mg Oral Q breakfast   And   metFORMIN  2,000 mg Oral Q breakfast   insulin aspart  0-9 Units Subcutaneous TID WC & HS   metoprolol tartrate  100 mg Oral BID   pantoprazole  40 mg Oral QAC breakfast   tamsulosin  0.4 mg Oral BID   warfarin  2.5 mg Oral ONCE-1600   Warfarin - Pharmacist Dosing Inpatient   Does not apply q1600   Continuous Infusions:  amiodarone 60 mg/hr (09/10/21 1137)   PRN Meds: acetaminophen **OR** acetaminophen, ondansetron **OR** ondansetron (ZOFRAN) IV   Vital Signs    Vitals:   09/10/21 0908 09/10/21 0955 09/10/21 1000 09/10/21 1100  BP: 102/64  104/80 97/70  Pulse: 89 84 82 (!) 106  Resp: (!) 23 (!) 23 20 16   Temp:      TempSrc:      SpO2: 96% 93% 94% 97%  Weight:      Height:        Intake/Output Summary (Last 24 hours) at 09/10/2021 1139 Last data filed at 09/10/2021 1000 Gross per 24 hour  Intake 4132.13 ml  Output 2525 ml  Net 1607.13 ml   Last 3 Weights 09/06/2021 07/10/2021 06/18/2021  Weight (lbs) 234 lb 259 lb 255 lb  Weight (kg) 106.142 kg 117.482 kg 115.667 kg      Telemetry    Atrial fibrillation generally controlled in the 90s, unclear what his rates are with ambulation - Personally Reviewed  ECG    No new tracings - Personally Reviewed  Physical Exam   GEN: No acute distress.   Neck: No JVD Cardiac: irregular rhythm, regular rate Respiratory: Clear to auscultation bilaterally. GI: Soft, nontender, non-distended  MS: No edema, chronic skin changes No deformity. Neuro:  Nonfocal  Psych: Normal affect   Labs    High Sensitivity Troponin:  No results for input(s):  TROPONINIHS in the last 720 hours.   Chemistry Recent Labs  Lab 09/06/21 1553 09/07/21 0500 09/07/21 1909 09/08/21 0513 09/10/21 0417  NA 137   < > 137 137 138  K 4.0   < > 4.0 4.5 4.0  CL 103   < > 105 106 109  CO2 21*   < > 22 23 22   GLUCOSE 164*   < > 153* 159* 145*  BUN 26*   < > 19 16 15   CREATININE 1.17   < > 0.96 0.91 0.88  CALCIUM 8.3*   < > 7.9* 7.7* 7.7*  MG  --   --  1.7  --   --   PROT 6.9  --   --   --   --   ALBUMIN 2.5*  --   --   --   --   AST 20  --   --   --   --   ALT 14  --   --   --   --   ALKPHOS 83  --   --   --   --   BILITOT 1.0  --   --   --   --   GFRNONAA >60   < > >  60 >60 >60  ANIONGAP 13   < > 10 8 7    < > = values in this interval not displayed.    Lipids No results for input(s): CHOL, TRIG, HDL, LABVLDL, LDLCALC, CHOLHDL in the last 168 hours.  Hematology Recent Labs  Lab 09/08/21 0513 09/09/21 0441 09/10/21 0417  WBC 7.1 6.1 6.0  RBC 3.34* 3.08* 3.10*  HGB 8.6* 8.0* 8.2*  HCT 28.6* 26.6* 26.9*  MCV 85.6 86.4 86.8  MCH 25.7* 26.0 26.5  MCHC 30.1 30.1 30.5  RDW 19.9* 19.9* 19.9*  PLT 250 224 215   Thyroid  Recent Labs  Lab 09/09/21 0441  TSH 1.574    BNP Recent Labs  Lab 09/06/21 1553  BNP 219.2*    DDimer No results for input(s): DDIMER in the last 168 hours.   Radiology    ECHOCARDIOGRAM COMPLETE  Result Date: 09/09/2021    ECHOCARDIOGRAM REPORT   Patient Name:   JEROMEY KRUER Date of Exam: 09/09/2021 Medical Rec #:  [id number]          Height:       71.0 in Accession #:    [id number]         Weight:       234.0 lb Date of Birth:  01/04/1951          BSA:          2.254 m Patient Age:    69 years           BP:           100/64 mmHg Patient Gender: M                  HR:           78 bpm. Exam Location:  Inpatient Procedure: 2D Echo Indications:    Dyspnea  History:        Patient has prior history of Echocardiogram examinations, most                 recent 05/02/2019. Arrythmias:Atrial Fibrillation; Risk                  Factors:Hypertension and Diabetes.  Sonographer:    Arlyss Gandy Referring Phys: [phone] HAO MENG IMPRESSIONS  1. Left ventricular ejection fraction, by estimation, is 60 to 65%. The left ventricle has normal function. The left ventricle has no regional wall motion abnormalities. There is mild left ventricular hypertrophy. Left ventricular diastolic parameters are indeterminate.  2. Right ventricular systolic function is normal. The right ventricular size is normal. There is normal pulmonary artery systolic pressure. The estimated right ventricular systolic pressure is 51.8 mmHg.  3. Right atrial size was mildly dilated.  4. The mitral valve is normal in structure. Trivial mitral valve regurgitation.  5. The aortic valve is tricuspid. Aortic valve regurgitation is not visualized. Aortic valve sclerosis is present, with no evidence of aortic valve stenosis.  6. The inferior vena cava is dilated in size with >50% respiratory variability, suggesting right atrial pressure of 8 mmHg. FINDINGS  Left Ventricle: Left ventricular ejection fraction, by estimation, is 60 to 65%. The left ventricle has normal function. The left ventricle has no regional wall motion abnormalities. The left ventricular internal cavity size was normal in size. There is  mild left ventricular hypertrophy. Left ventricular diastolic parameters are indeterminate. Right Ventricle: The right ventricular size is normal. Right vetricular wall thickness was not well visualized. Right ventricular systolic function is normal. There is  normal pulmonary artery systolic pressure. The tricuspid regurgitant velocity is 2.31 m/s, and with an assumed right atrial pressure of 8 mmHg, the estimated right ventricular systolic pressure is 95.2 mmHg. Left Atrium: Left atrial size was normal in size. Right Atrium: Right atrial size was mildly dilated. Pericardium: Trivial pericardial effusion is present. Mitral Valve: The mitral valve is normal in structure. Trivial  mitral valve regurgitation. Tricuspid Valve: The tricuspid valve is normal in structure. Tricuspid valve regurgitation is trivial. Aortic Valve: The aortic valve is tricuspid. Aortic valve regurgitation is not visualized. Aortic valve sclerosis is present, with no evidence of aortic valve stenosis. Aortic valve mean gradient measures 4.3 mmHg. Aortic valve peak gradient measures 7.8  mmHg. Aortic valve area, by VTI measures 2.54 cm. Pulmonic Valve: The pulmonic valve was not well visualized. Pulmonic valve regurgitation is not visualized. Aorta: The aortic root and ascending aorta are structurally normal, with no evidence of dilitation. Venous: The inferior vena cava is dilated in size with greater than 50% respiratory variability, suggesting right atrial pressure of 8 mmHg. IAS/Shunts: No atrial level shunt detected by color flow Doppler.  LEFT VENTRICLE PLAX 2D LVIDd:         5.00 cm   Diastology LVIDs:         3.30 cm   LV e' medial:    10.60 cm/s LV PW:         1.10 cm   LV E/e' medial:  9.3 LV IVS:        0.90 cm   LV e' lateral:   9.25 cm/s LVOT diam:     2.20 cm   LV E/e' lateral: 10.6 LV SV:         66 LV SV Index:   29 LVOT Area:     3.80 cm  RIGHT VENTRICLE             IVC RV S prime:     12.70 cm/s  IVC diam: 2.80 cm TAPSE (M-mode): 1.8 cm LEFT ATRIUM             Index LA diam:        4.80 cm 2.13 cm/m LA Vol (A2C):   61.4 ml 27.24 ml/m LA Vol (A4C):   66.2 ml 29.37 ml/m LA Biplane Vol: 64.0 ml 28.39 ml/m  AORTIC VALVE AV Area (Vmax):    2.46 cm AV Area (Vmean):   2.39 cm AV Area (VTI):     2.54 cm AV Vmax:           139.67 cm/s AV Vmean:          96.500 cm/s AV VTI:            0.261 m AV Peak Grad:      7.8 mmHg AV Mean Grad:      4.3 mmHg LVOT Vmax:         90.27 cm/s LVOT Vmean:        60.767 cm/s LVOT VTI:          0.174 m LVOT/AV VTI ratio: 0.67  AORTA Ao Root diam: 3.40 cm Ao Asc diam:  3.50 cm MITRAL VALVE               TRICUSPID VALVE MV Area (PHT): 4.06 cm    TR Peak grad:   21.3 mmHg  MV Decel Time: 187 msec    TR Vmax:        231.00 cm/s MV E velocity: 98.50 cm/s  SHUNTS                            Systemic VTI:  0.17 m                            Systemic Diam: 2.20 cm Oswaldo Milian MD Electronically signed by Oswaldo Milian MD Signature Date/Time: 09/09/2021/7:18:44 PM    Final     Cardiac Studies   Echo 09/09/21:  1. Left ventricular ejection fraction, by estimation, is 60 to 65%. The  left ventricle has normal function. The left ventricle has no regional  wall motion abnormalities. There is mild left ventricular hypertrophy.  Left ventricular diastolic parameters  are indeterminate.   2. Right ventricular systolic function is normal. The right ventricular  size is normal. There is normal pulmonary artery systolic pressure. The  estimated right ventricular systolic pressure is 34.7 mmHg.   3. Right atrial size was mildly dilated.   4. The mitral valve is normal in structure. Trivial mitral valve  regurgitation.   5. The aortic valve is tricuspid. Aortic valve regurgitation is not  visualized. Aortic valve sclerosis is present, with no evidence of aortic  valve stenosis.   6. The inferior vena cava is dilated in size with >50% respiratory  variability, suggesting right atrial pressure of 8 mmHg.   Patient Profile     71 y.o. 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, hypertension, DM2 and a history of DVT in 2015 who is being seen 09/09/2021 for the evaluation of presyncope   Assessment & Plan    Permanent atrial fibrillation Atrial fibrillation with RVR - in the setting of anemia and metastatic prostate cancer - continue BB - echo this admission with preserved EF, normal RV fx, and no significant valvular disease - continue amiodarone - rates are now in the 90s, but has not ambulated yet - transition IV amiodarone  to 400 mg BID x 14 days, then 200 mg daily   Presyncope - suspect RVR, marginal pressure, and anemia contributory - orthostatic vitals normal, but increase in HR upon standing - losartan has been held - continue BB - no further episodes, states his dizziness has resolved   Dyspnea on exertion - echo yesterday with LVEF 60-65%, no RWMA, normal RV function, no significant valvular disease - feels much better off of radium and now on amiodarone   Chronic anticoagulation - on coumadin -continue - Afib and DVT - INR 2.7   I will arrange cardiology follow up.    For questions or updates, please contact [place] Please consult [email or web address] for contact info under        Signed, Ledora Bottcher, PA  09/10/2021, 11:39 AM

## 2021-09-10 NOTE — Progress Notes (Signed)
°  Transition of Care [facility]) Screening Note   Patient Details  Name: Tommy Yang Date of Birth: 01/01/1951   Transition of Care [facility]) CM/SW Contact:    Dessa Phi, RN Phone Number: 09/10/2021, 10:10 AM    Transition of Care Department [facility]) has reviewed patient and no TOC needs have been identified at this time. We will continue to monitor patient advancement through interdisciplinary progression rounds. If new patient transition needs arise, please place a TOC consult.

## 2021-09-11 ENCOUNTER — Ambulatory Visit (HOSPITAL_COMMUNITY): Payer: BC Managed Care – PPO

## 2021-09-11 DIAGNOSIS — I4819 Other persistent atrial fibrillation: Secondary | ICD-10-CM | POA: Diagnosis not present

## 2021-09-11 LAB — CBC
HCT: 27.9 % — ABNORMAL LOW (ref 39.0–52.0)
Hemoglobin: 8.8 g/dL — ABNORMAL LOW (ref 13.0–17.0)
MCH: 26.6 pg (ref 26.0–34.0)
MCHC: 31.5 g/dL (ref 30.0–36.0)
MCV: 84.3 fL (ref 80.0–100.0)
Platelets: 240 10*3/uL (ref 150–400)
RBC: 3.31 MIL/uL — ABNORMAL LOW (ref 4.22–5.81)
RDW: 19.6 % — ABNORMAL HIGH (ref 11.5–15.5)
WBC: 6.3 10*3/uL (ref 4.0–10.5)
nRBC: 0 % (ref 0.0–0.2)

## 2021-09-11 LAB — PROTIME-INR
INR: 2.3 — ABNORMAL HIGH (ref 0.8–1.2)
Prothrombin Time: 25.3 seconds — ABNORMAL HIGH (ref 11.4–15.2)

## 2021-09-11 LAB — GLUCOSE, CAPILLARY
Glucose-Capillary: 124 mg/dL — ABNORMAL HIGH (ref 70–99)
Glucose-Capillary: 135 mg/dL — ABNORMAL HIGH (ref 70–99)
Glucose-Capillary: 143 mg/dL — ABNORMAL HIGH (ref 70–99)
Glucose-Capillary: 169 mg/dL — ABNORMAL HIGH (ref 70–99)

## 2021-09-11 MED ORDER — WARFARIN SODIUM 5 MG PO TABS
5.0000 mg | ORAL_TABLET | Freq: Once | ORAL | Status: AC
  Administered 2021-09-11: 5 mg via ORAL
  Filled 2021-09-11: qty 1

## 2021-09-11 NOTE — Progress Notes (Signed)
ANTICOAGULATION CONSULT NOTE - Follow Up Consult  Pharmacy Consult for warfarin Indication: hx atrial fibrillation and DVT  Allergies  Allergen Reactions   Shingrix [Zoster Vac Recomb Adjuvanted] Shortness Of Breath and Other (See Comments)    Pulse increased to 170+ and it affected the A-Fib   Other     shingls shot caused atrial fib did not take 2nd shot    Patient Measurements: Height: 5\' 11"  (180.3 cm) Weight: 106.1 kg (234 lb) IBW/kg (Calculated) : 75.3 Heparin Dosing Weight:   Vital Signs: Temp: 97.9 F (36.6 C) (01/11 0307) Temp Source: Oral (01/11 0307) BP: 105/56 (01/11 0307) Pulse Rate: 104 (01/11 0307)  Labs: Recent Labs    09/09/21 0441 09/10/21 0417 09/10/21 0644 09/11/21 0421  HGB 8.0* 8.2*  --  8.8*  HCT 26.6* 26.9*  --  27.9*  PLT 224 215  --  240  LABPROT 31.7*  --  28.9* 25.3*  INR 3.1*  --  2.7* 2.3*  CREATININE  --  0.88  --   --      Estimated Creatinine Clearance: 99.5 mL/min (by C-G formula based on SCr of 0.88 mg/dL).   Medications:  The following information obtained from [facility] clinic notes: -Home dose: Warfarin 7.5 mg M,F; 5 mg all other days of the week -Dose recently reduced on 09/03/21 after a supratherapeutic INR reading of >10 on 08/30/21 while taking 7.5 mg daily  Assessment: Patient is a 71 y.o M with hx metastatic cancer, DVT and afib on warfarin PTA, presented to the ED on 09/06/21 with c/o SOB and dizziness. Chest CT on 09/06/21 was negative for PE.  Warfarin resumed on admission.  Today, 09/11/2021: - INR is therapeutic but decreased from 2.7 to 2.3 - cbc somewhat stable - no bleeding documented - signif drug-drug intxns: amiodarone can increase INR   Goal of Therapy:  INR 2-3 Monitor platelets by anticoagulation protocol: Yes   Plan:  - warfarin 5 mg PO x1 today - daily INR - monitor for s/sx bleeding  Devin Going, Ewelina Naves P 09/11/2021,8:27 AM

## 2021-09-11 NOTE — Progress Notes (Signed)
Patient placed on fall precautions with Charge Nurse / Primary Nurse in room.

## 2021-09-11 NOTE — Progress Notes (Signed)
Patient upset with fall prevention policy.  Patient was told by Charge Nurse about walking in halls not allowed after episode.  Patient yelling at staff and saying he was "going to call his lawyer; this is ridiculous."  Charge Nurse made aware.

## 2021-09-11 NOTE — Progress Notes (Signed)
PROGRESS NOTE    Tommy Yang  YNW:[id number] DOB: May 22, 1951 DOA: 09/06/2021 PCP: [name], MD   Brief Narrative:  71 year old male with a history of prostate cancer with bone mets, chronic atrial fibrillation, hypertension, diabetes, presents with dyspnea on exertion and near syncope while ambulating. Noted to be in rapid atrial fibrillation on admission.  Admitted for further heart rate control.     Assessment & Plan:   Symptomatic A. fib, chronic with RVR, POA, improving Dyspnea with exertion secondary to rapid ventricular response, POA -Cardiology previously following, recommending amiodarone taper, will follow outpatient setting -Continues to have tachycardia and symptomatology with ambulation but improving. -Echocardiogram shows normal EF -TSH 1.54  Hypertension -Continue metoprolol, Lasix; holding ARB   Prostate cancer with mets to bone -Continue treatment per oncology/urology -Discussed with urology, Dr. 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 -Blood sugars currently stable  DVT prophylaxis: Coumadin Code Status: Full Family Communication: None present  Status is: Inpatient  Dispo: The patient is from: Home              Anticipated d/c is to: Home              Anticipated d/c date is: 24 to 48 hours              Patient currently is not medically stable for discharge  Consultants:  Cardiology  Procedures:  None  Antimicrobials:  None  Subjective: No acute issues or  events overnight continues to be dyspneic with minimal exertion but heart rate improving.  Denies chest pain headache fever chills nausea vomiting diarrhea  Objective: Vitals:   09/10/21 2121 09/11/21 0007 09/11/21 0031 09/11/21 0307  BP: 100/67 101/69 90/61 (!) 105/56  Pulse: (!) 104 83 88 (!) 104  Resp: 18 18 18 18   Temp: 98.5 F (36.9 C) 97.9 F (36.6 C) 97.8 F (36.6 C) 97.9 F (36.6 C)  TempSrc: Oral Oral Oral Oral  SpO2: 96% 98% 97% 98%  Weight:      Height:        Intake/Output Summary (Last 24 hours) at 09/11/2021 0752 Last data filed at 09/11/2021 0728 Gross per 24 hour  Intake 1783.41 ml  Output 1750 ml  Net 33.41 ml   Filed Weights   09/06/21 1438  Weight: 106.1 kg    Examination:  General exam: Appears calm and comfortable  Respiratory system: Clear to auscultation. Respiratory effort normal. Cardiovascular system: S1 & S2 heard, RRR. No JVD, murmurs, rubs, gallops or clicks. No pedal edema. Gastrointestinal system: Abdomen is nondistended, soft and nontender. No organomegaly or masses felt. Normal bowel sounds heard. Central nervous system: Alert and oriented. No focal neurological deficits. Extremities: Symmetric 5 x 5 power. Skin: No rashes, lesions or ulcers Psychiatry: Judgement and insight appear normal. Mood & affect appropriate.     Data Reviewed: I have personally reviewed following labs and imaging studies  CBC: Recent Labs  Lab 09/06/21 1553 09/07/21 0500 09/07/21 1909 09/08/21 0513 09/09/21 0441 09/10/21 0417 09/11/21 0421  WBC 7.6   < > 7.9 7.1  6.1 6.0 6.3  NEUTROABS 6.0  --   --   --   --   --   --   HGB 8.9*   < > 8.6* 8.6* 8.0* 8.2* 8.8*  HCT 28.6*   < > 28.0* 28.6* 26.6* 26.9* 27.9*  MCV 83.9   < > 87.0 85.6 86.4 86.8 84.3  PLT 264   < > 247 250 224 215 240   < > = values in this interval not displayed.   Basic Metabolic Panel: Recent Labs  Lab 09/06/21 1553 09/07/21 0500 09/07/21 1909 09/08/21 0513 09/10/21 0417  NA  137 136 137 137 138  K 4.0 3.6 4.0 4.5 4.0  CL 103 101 105 106 109  CO2 21* 22 22 23 22   GLUCOSE 164* 163* 153* 159* 145*  BUN 26* 23 19 16 15   CREATININE 1.17 0.92 0.96 0.91 0.88  CALCIUM 8.3* 7.9* 7.9* 7.7* 7.7*  MG  --   --  1.7  --   --    GFR: Estimated Creatinine Clearance: 99.5 mL/min (by C-G formula based on SCr of 0.88 mg/dL). Liver Function Tests: Recent Labs  Lab 09/06/21 1553  AST 20  ALT 14  ALKPHOS 83  BILITOT 1.0  PROT 6.9  ALBUMIN 2.5*   No results for input(s): LIPASE, AMYLASE in the last 168 hours. No results for input(s): AMMONIA in the last 168 hours. Coagulation Profile: Recent Labs  Lab 09/07/21 0500 09/08/21 0513 09/09/21 0441 09/10/21 0644 09/11/21 0421  INR 3.0* 3.8* 3.1* 2.7* 2.3*   Cardiac Enzymes: No results for input(s): CKTOTAL, CKMB, CKMBINDEX, TROPONINI in the last 168 hours. BNP (last 3 results) No results for input(s): PROBNP in the last 8760 hours. HbA1C: No results for input(s): HGBA1C in the last 72 hours. CBG: Recent Labs  Lab 09/10/21 0743 09/10/21 1133 09/10/21 1655 09/10/21 2123 09/11/21 0725  GLUCAP 148* 127* 141* 198* 124*   Lipid Profile: No results for input(s): CHOL, HDL, LDLCALC, TRIG, CHOLHDL, LDLDIRECT in the last 72 hours. Thyroid Function Tests: Recent Labs    09/09/21 0441  TSH 1.574   Anemia Panel: No results for input(s): VITAMINB12, FOLATE, FERRITIN, TIBC, IRON, RETICCTPCT in the last 72 hours. Sepsis Labs: No results for input(s): PROCALCITON, LATICACIDVEN in the last 168 hours.  Recent Results (from the past 240 hour(s))  Resp Panel by RT-PCR (Flu A&B, Covid) Nasopharyngeal Swab     Status: None   Collection Time: 09/07/21  6:39 AM   Specimen: Nasopharyngeal Swab; Nasopharyngeal(NP) swabs in vial transport medium  Result Value Ref Range Status   SARS Coronavirus 2 by RT PCR NEGATIVE NEGATIVE Final    Comment: (NOTE) SARS-CoV-2 target nucleic acids are NOT DETECTED.  The SARS-CoV-2 RNA is  generally detectable in upper respiratory specimens during the acute phase of infection. The lowest concentration of SARS-CoV-2 viral copies this assay can detect is 138 copies/mL. A negative result does not preclude SARS-Cov-2 infection and should not be used as the sole basis for treatment or other patient management decisions. A negative result may occur with  improper specimen collection/handling, submission of specimen other than nasopharyngeal swab, presence of viral mutation(s) within the areas targeted by this assay, and inadequate number of viral copies(<138 copies/mL). A negative result must be combined with clinical observations, patient history, and epidemiological information. The expected result is Negative.  Fact Sheet for Patients:  EntrepreneurPulse.com.au  Fact Sheet for Healthcare Providers:  IncredibleEmployment.be  This test is no t yet approved or cleared by  the [employer] and  has been authorized for detection and/or diagnosis of SARS-CoV-2 by FDA under an Emergency Use Authorization (EUA). This EUA will remain  in effect (meaning this test can be used) for the duration of the COVID-19 declaration under Section 564(b)(1) of the Act, 21 U.S.C.section 360bbb-3(b)(1), unless the authorization is terminated  or revoked sooner.       Influenza A by PCR NEGATIVE NEGATIVE Final   Influenza B by PCR NEGATIVE NEGATIVE Final    Comment: (NOTE) The Xpert Xpress SARS-CoV-2/FLU/RSV plus assay is intended as an aid in the diagnosis of influenza from Nasopharyngeal swab specimens and should not be used as a sole basis for treatment. Nasal washings and aspirates are unacceptable for Xpert Xpress SARS-CoV-2/FLU/RSV testing.  Fact Sheet for Patients: EntrepreneurPulse.com.au  Fact Sheet for Healthcare Providers: IncredibleEmployment.be  This test is not yet approved or cleared by the [place] FDA and has been authorized for detection and/or diagnosis of SARS-CoV-2 by FDA under an Emergency Use Authorization (EUA). This EUA will remain in effect (meaning this test can be used) for the duration of the COVID-19 declaration under Section 564(b)(1) of the Act, 21 U.S.C. section 360bbb-3(b)(1), unless the authorization is terminated or revoked.  Performed at [facility], [place] [address], [place], [place] [address]          Radiology Studies: ECHOCARDIOGRAM COMPLETE  Result Date: 09/09/2021    ECHOCARDIOGRAM REPORT   Patient Name:   KAYLUB DETIENNE Date of Exam: 09/09/2021 Medical Rec #:  [id number]          Height:       71.0 in Accession #:    [id number]         Weight:       234.0 lb Date of Birth:  November 15, 1950          BSA:          2.254 m Patient Age:    75 years           BP:           100/64 mmHg Patient Gender: M                  HR:           78 bpm. Exam Location:  Inpatient Procedure: 2D Echo Indications:    Dyspnea  History:        Patient has prior history of Echocardiogram examinations, most                 recent 05/02/2019. Arrythmias:Atrial Fibrillation; Risk                 Factors:Hypertension and Diabetes.  Sonographer:    Arlyss Gandy Referring Phys: [phone] HAO MENG IMPRESSIONS  1. Left ventricular ejection fraction, by estimation, is 60 to 65%. The left ventricle has normal function. The left ventricle has no regional wall motion abnormalities. There is mild left ventricular hypertrophy. Left ventricular diastolic parameters are indeterminate.  2. Right ventricular systolic function is normal. The right ventricular size is normal. There is normal pulmonary artery systolic pressure. The estimated right ventricular systolic pressure is 71.2 mmHg.  3. Right atrial size was mildly dilated.  4. The mitral valve is normal in structure. Trivial mitral valve regurgitation.  5. The aortic valve is tricuspid. Aortic valve regurgitation is not visualized.  Aortic valve sclerosis is present, with no evidence of aortic valve stenosis.  6. The inferior vena cava  is dilated in size with >50% respiratory variability, suggesting right atrial pressure of 8 mmHg. FINDINGS  Left Ventricle: Left ventricular ejection fraction, by estimation, is 60 to 65%. The left ventricle has normal function. The left ventricle has no regional wall motion abnormalities. The left ventricular internal cavity size was normal in size. There is  mild left ventricular hypertrophy. Left ventricular diastolic parameters are indeterminate. Right Ventricle: The right ventricular size is normal. Right vetricular wall thickness was not well visualized. Right ventricular systolic function is normal. There is normal pulmonary artery systolic pressure. The tricuspid regurgitant velocity is 2.31 m/s, and with an assumed right atrial pressure of 8 mmHg, the estimated right ventricular systolic pressure is 97.6 mmHg. Left Atrium: Left atrial size was normal in size. Right Atrium: Right atrial size was mildly dilated. Pericardium: Trivial pericardial effusion is present. Mitral Valve: The mitral valve is normal in structure. Trivial mitral valve regurgitation. Tricuspid Valve: The tricuspid valve is normal in structure. Tricuspid valve regurgitation is trivial. Aortic Valve: The aortic valve is tricuspid. Aortic valve regurgitation is not visualized. Aortic valve sclerosis is present, with no evidence of aortic valve stenosis. Aortic valve mean gradient measures 4.3 mmHg. Aortic valve peak gradient measures 7.8  mmHg. Aortic valve area, by VTI measures 2.54 cm. Pulmonic Valve: The pulmonic valve was not well visualized. Pulmonic valve regurgitation is not visualized. Aorta: The aortic root and ascending aorta are structurally normal, with no evidence of dilitation. Venous: The inferior vena cava is dilated in size with greater than 50% respiratory variability, suggesting right atrial pressure of 8 mmHg.  IAS/Shunts: No atrial level shunt detected by color flow Doppler.  LEFT VENTRICLE PLAX 2D LVIDd:         5.00 cm   Diastology LVIDs:         3.30 cm   LV e' medial:    10.60 cm/s LV PW:         1.10 cm   LV E/e' medial:  9.3 LV IVS:        0.90 cm   LV e' lateral:   9.25 cm/s LVOT diam:     2.20 cm   LV E/e' lateral: 10.6 LV SV:         66 LV SV Index:   29 LVOT Area:     3.80 cm  RIGHT VENTRICLE             IVC RV S prime:     12.70 cm/s  IVC diam: 2.80 cm TAPSE (M-mode): 1.8 cm LEFT ATRIUM             Index LA diam:        4.80 cm 2.13 cm/m LA Vol (A2C):   61.4 ml 27.24 ml/m LA Vol (A4C):   66.2 ml 29.37 ml/m LA Biplane Vol: 64.0 ml 28.39 ml/m  AORTIC VALVE AV Area (Vmax):    2.46 cm AV Area (Vmean):   2.39 cm AV Area (VTI):     2.54 cm AV Vmax:           139.67 cm/s AV Vmean:          96.500 cm/s AV VTI:            0.261 m AV Peak Grad:      7.8 mmHg AV Mean Grad:      4.3 mmHg LVOT Vmax:         90.27 cm/s LVOT Vmean:        60.767 cm/s  LVOT VTI:          0.174 m LVOT/AV VTI ratio: 0.67  AORTA Ao Root diam: 3.40 cm Ao Asc diam:  3.50 cm MITRAL VALVE               TRICUSPID VALVE MV Area (PHT): 4.06 cm    TR Peak grad:   21.3 mmHg MV Decel Time: 187 msec    TR Vmax:        231.00 cm/s MV E velocity: 98.50 cm/s                            SHUNTS                            Systemic VTI:  0.17 m                            Systemic Diam: 2.20 cm Oswaldo Milian MD Electronically signed by Oswaldo Milian MD Signature Date/Time: 09/09/2021/7:18:44 PM    Final         Scheduled Meds:  amiodarone  400 mg Oral BID   Followed by   Derrill Memo ON 09/24/2021] amiodarone  200 mg Oral Daily   atorvastatin  80 mg Oral Daily   Chlorhexidine Gluconate Cloth  6 each Topical Daily   empagliflozin  25 mg Oral Q breakfast   And   metFORMIN  2,000 mg Oral Q breakfast   furosemide  40 mg Oral Daily   insulin aspart  0-9 Units Subcutaneous TID WC & HS   metoprolol tartrate  100 mg Oral BID   pantoprazole  40  mg Oral QAC breakfast   tamsulosin  0.4 mg Oral BID   Warfarin - Pharmacist Dosing Inpatient   Does not apply q1600   Continuous Infusions:   LOS: 3 days   Time spent: 46min  Tymier Lindholm C Andros Channing, DO [facility]  If 7PM-7AM, please contact night-coverage [email or web address]  09/11/2021, 7:52 AM

## 2021-09-11 NOTE — Progress Notes (Signed)
°   09/11/21 1349  Mobility  Head of Bed Elevated  Self regulated  Activity Ambulated in room;Ambulated to bathroom  Range of Motion/Exercises All extremities;Active  Level of Assistance Independent after set-up  Minutes Ambulated 10 minutes  Distance Ambulated (ft) 180 ft  Mobility Sit up in bed/chair position for meals  Mobility Response Tolerated well  Mobility performed by Mobility specialist;Nurse tech  Transport method Ambulatory  $Mobility charge 1 Mobility   Pt was up and beginning to ambulate PTA. Ambulated about 145ft in hall with no AD, tolerated well Took 1 standing rest break. Left pt in bed with call bell at side.   [place] Specialist Acute Rehab Services Office: [phone]

## 2021-09-11 NOTE — Discharge Summary (Signed)
Physician Discharge Summary  Tommy Yang HYW:[id number] DOB: [birth year] DOA: 09/06/2021  PCP: [name], MD  Admit date: 09/06/2021 Discharge date: 09/12/2021  Admitted From: Home Disposition: Home  Recommendations for Outpatient Follow-up:  Follow up with PCP in 1-2 weeks Please obtain BMP/CBC in one week Please follow up with cardiology as scheduled  Home Health: None Equipment/Devices: None  Discharge Condition: Stable CODE STATUS: Full Diet recommendation: Low-salt low-fat diet  Brief/Interim Summary: 71 year old male with a history of prostate cancer with bone mets, chronic atrial fibrillation, hypertension, diabetes, presents with dyspnea on exertion and near syncope while ambulating. Noted to be in rapid atrial fibrillation on admission.  Admitted for further heart rate control.     Assessment & Plan:   Symptomatic A. fib, chronic with RVR, POA, improving Dyspnea with exertion secondary to rapid ventricular response, POA -Cardiology previously following, recommending amiodarone taper, will follow outpatient setting -Tachycardia moderately improving, no longer having symptoms with ambulation -Echocardiogram shows normal EF -TSH 1.54   Hypertension -Continue metoprolol, Lasix; holding ARB   Prostate cancer with mets to bone -Continue treatment per oncology/urology -Discussed with urology, Dr. 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 -Blood sugars currently stable  Discharge  Instructions  Allergies as of 09/12/2021       Reactions   Shingrix [zoster Vac Recomb Adjuvanted] Shortness Of Breath, Other (See Comments)   Pulse increased to 170+ and it affected the A-Fib   Other    shingls shot caused atrial fib did not take 2nd shot        Medication List     STOP taking these medications    traMADol 50 MG tablet Commonly known as: Ultram       TAKE these medications    acetaminophen 500 MG tablet Commonly known as: TYLENOL Take 500-1,000 mg by mouth every 6 (six) hours as needed (for pain).   amiodarone 400 MG tablet Commonly known as: PACERONE Take 1 tablet (400 mg total) by mouth 2 (two) times daily for 11 days.   amiodarone 200 MG tablet Commonly known as: PACERONE Take 1 tablet (200 mg total) by mouth daily. Start taking on: September 24, 2021   atorvastatin 80 MG tablet Commonly known as: LIPITOR TAKE 1 TABLET BY MOUTH EVERY DAY What changed:  when to take this Another medication with the same name was removed. Continue taking this medication, and follow the directions you see here.   blood glucose meter kit and supplies Kit Test blood sugar daily as directed. Dx code: E11.9   CALCIUM PO Take 1,000 mg by mouth daily.   diphenoxylate-atropine 2.5-0.025 MG tablet Commonly known as: LOMOTIL TAKE 1 TABLET BY MOUTH 4 (FOUR) TIMES DAILY AS NEEDED FOR DIARRHEA OR LOOSE STOOLS.   docusate sodium 100 MG capsule Commonly known as: Colace Take 1 capsule (100 mg total) by mouth 2 (two) times daily.   Erleada 60 MG tablet Generic drug: apalutamide Take 240 mg by mouth daily.   furosemide 40 MG tablet Commonly known as: LASIX Take 1 tablet (40 mg total) by mouth daily.  What changed:  medication strength how much to take how to take this when to take this additional instructions   glimepiride 1 MG tablet Commonly known as: AMARYL TAKE 1 TABLET BY MOUTH EVERY DAY WITH BREAKFAST What changed: See the new instructions.   glucose  blood test strip Commonly known as: Accu-Chek Guide Use as instructed to check blood sugar twice daily.   leuprolide 11.25 MG injection Commonly known as: LUPRON Inject 11.25 mg into the muscle every 6 (six) months.   losartan 50 MG tablet Commonly known as: COZAAR 1 TABLET IN THE MORNING AND HALF TABLET IN THE EVENING What changed: See the new instructions.   metoprolol tartrate 100 MG tablet Commonly known as: LOPRESSOR Take 1 tablet (100 mg total) by mouth 2 (two) times daily. Appointment Required For Further Refills [phone]   Ozempic (0.25 or 0.5 MG/DOSE) 2 MG/1.5ML Sopn Generic drug: Semaglutide(0.25 or 0.5MG/DOS) Inject 0.5 mg into the skin once a week. What changed:  how much to take when to take this   pantoprazole 40 MG tablet Commonly known as: PROTONIX TAKE 1 TABLET BY MOUTH EVERY DAY What changed: when to take this   Synjardy XR 12.12-998 MG Tb24 Generic drug: Empagliflozin-metFORMIN HCl ER TAKE 2 TABLETS BY MOUTH EVERY DAY What changed:  how much to take how to take this when to take this additional instructions   tamsulosin 0.4 MG Caps capsule Commonly known as: FLOMAX Take 1 capsule (0.4 mg total) by mouth daily. What changed: when to take this   VITAMIN B 12 PO Take 1,000 mcg by mouth daily.   Vitamin D3 10 MCG (400 UNIT) Caps Take 400 Units by mouth daily.   warfarin 5 MG tablet Commonly known as: COUMADIN Take as directed. If you are unsure how to take this medication, talk to your nurse or doctor. Original instructions: Take 1 tablet (5 mg total) by mouth daily after breakfast. What changed: See the new instructions.   zinc gluconate 50 MG tablet Take 50 mg by mouth daily.        Allergies  Allergen Reactions   Shingrix [Zoster Vac Recomb Adjuvanted] Shortness Of Breath and Other (See Comments)    Pulse increased to 170+ and it affected the A-Fib   Other     shingls shot caused atrial fib did not take 2nd shot     Consultations: Cardiology  Procedures/Studies: DG Chest 2 View  Result Date: 09/06/2021 CLINICAL DATA:  Shortness of breath. EXAM: CHEST - 2 VIEW COMPARISON:  May 22, 2019. FINDINGS: The heart size and mediastinal contours are within normal limits. Left lung is clear. There appears to be interval development of right upper lobe opacity which may represent pneumonia, but neoplasm cannot be excluded. The visualized skeletal structures are unremarkable. IMPRESSION: Interval development of right upper lobe opacity which may represent pneumonia, but neoplasm cannot be excluded. CT scan of the chest is recommended for further evaluation. Electronically Signed   By: Marijo Conception M.D.   On: 09/06/2021 15:32   CT Angio Chest PE W/Cm &/Or Wo Cm  Result Date: 09/06/2021 CLINICAL DATA:  Pulmonary embolism (PE) suspected, high prob EXAM: CT ANGIOGRAPHY CHEST WITH CONTRAST TECHNIQUE: Multidetector CT imaging of the chest was performed using the standard protocol during bolus administration of intravenous contrast. Multiplanar CT image reconstructions and MIPs were obtained to evaluate the vascular anatomy. CONTRAST:  77m OMNIPAQUE IOHEXOL 350 MG/ML SOLN COMPARISON:  2017 FINDINGS: Cardiovascular: Satisfactory opacification of the pulmonary arteries to the segmental  level. No evidence of pulmonary embolism. Normal heart size. No pericardial effusion. Coronary artery calcification. Thoracic aorta is normal in caliber with mild calcified plaque. Mediastinum/Nodes: No enlarged nodes. Included thyroid is unremarkable. Esophagus is unremarkable. Lungs/Pleura: No pleural effusion or pneumothorax. Questioned right upper lobe opacity on radiograph is not identified by CT. Minimal patchy ground-glass density in both lungs. Upper Abdomen: Possible bilateral hydronephrosis. This was present on 2020 abdomen CT and is of unknown chronicity. Musculoskeletal: Osseous metastatic disease. No new compression deformity.  Review of the MIP images confirms the above findings. IMPRESSION: No evidence of acute pulmonary embolism. Right upper lobe opacity on radiograph is not confirmed by CT. There is minimal patchy ground-glass density in both lungs that could reflect atelectasis or possibly early changes of a infectious/inflammatory process in the appropriate setting. Possible bilateral hydronephrosis. This was present on 2020 abdomen CT and may be chronic or recurrent. Diffuse osseous metastatic disease. Coronary artery and aortic calcification. Electronically Signed   By: Macy Mis M.D.   On: 09/06/2021 18:04   ECHOCARDIOGRAM COMPLETE  Result Date: 09/09/2021    ECHOCARDIOGRAM REPORT   Patient Name:   Tommy Yang Date of Exam: 09/09/2021 Medical Rec #:  [id number]          Height:       71.0 in Accession #:    [id number]         Weight:       234.0 lb Date of Birth:  [birth year]          BSA:          2.254 m Patient Age:    57 years           BP:           100/64 mmHg Patient Gender: M                  HR:           78 bpm. Exam Location:  Inpatient Procedure: 2D Echo Indications:    Dyspnea  History:        Patient has prior history of Echocardiogram examinations, most                 recent 05/02/2019. Arrythmias:Atrial Fibrillation; Risk                 Factors:Hypertension and Diabetes.  Sonographer:    Arlyss Gandy Referring Phys: [phone] HAO MENG IMPRESSIONS  1. Left ventricular ejection fraction, by estimation, is 60 to 65%. The left ventricle has normal function. The left ventricle has no regional wall motion abnormalities. There is mild left ventricular hypertrophy. Left ventricular diastolic parameters are indeterminate.  2. Right ventricular systolic function is normal. The right ventricular size is normal. There is normal pulmonary artery systolic pressure. The estimated right ventricular systolic pressure is 63.7 mmHg.  3. Right atrial size was mildly dilated.  4. The mitral valve is normal in structure.  Trivial mitral valve regurgitation.  5. The aortic valve is tricuspid. Aortic valve regurgitation is not visualized. Aortic valve sclerosis is present, with no evidence of aortic valve stenosis.  6. The inferior vena cava is dilated in size with >50% respiratory variability, suggesting right atrial pressure of 8 mmHg. FINDINGS  Left Ventricle: Left ventricular ejection fraction, by estimation, is 60 to 65%. The left ventricle has normal function. The left ventricle has no regional wall motion abnormalities. The left ventricular internal cavity size was normal in size. There is  mild left ventricular hypertrophy. Left ventricular diastolic parameters are indeterminate. Right Ventricle: The right ventricular size is normal. Right vetricular wall thickness was not well visualized. Right ventricular systolic function is normal. There is normal pulmonary artery systolic pressure. The tricuspid regurgitant velocity is 2.31 m/s, and with an assumed right atrial pressure of 8 mmHg, the estimated right ventricular systolic pressure is 07.8 mmHg. Left Atrium: Left atrial size was normal in size. Right Atrium: Right atrial size was mildly dilated. Pericardium: Trivial pericardial effusion is present. Mitral Valve: The mitral valve is normal in structure. Trivial mitral valve regurgitation. Tricuspid Valve: The tricuspid valve is normal in structure. Tricuspid valve regurgitation is trivial. Aortic Valve: The aortic valve is tricuspid. Aortic valve regurgitation is not visualized. Aortic valve sclerosis is present, with no evidence of aortic valve stenosis. Aortic valve mean gradient measures 4.3 mmHg. Aortic valve peak gradient measures 7.8  mmHg. Aortic valve area, by VTI measures 2.54 cm. Pulmonic Valve: The pulmonic valve was not well visualized. Pulmonic valve regurgitation is not visualized. Aorta: The aortic root and ascending aorta are structurally normal, with no evidence of dilitation. Venous: The inferior vena cava  is dilated in size with greater than 50% respiratory variability, suggesting right atrial pressure of 8 mmHg. IAS/Shunts: No atrial level shunt detected by color flow Doppler.  LEFT VENTRICLE PLAX 2D LVIDd:         5.00 cm   Diastology LVIDs:         3.30 cm   LV e' medial:    10.60 cm/s LV PW:         1.10 cm   LV E/e' medial:  9.3 LV IVS:        0.90 cm   LV e' lateral:   9.25 cm/s LVOT diam:     2.20 cm   LV E/e' lateral: 10.6 LV SV:         66 LV SV Index:   29 LVOT Area:     3.80 cm  RIGHT VENTRICLE             IVC RV S prime:     12.70 cm/s  IVC diam: 2.80 cm TAPSE (M-mode): 1.8 cm LEFT ATRIUM             Index LA diam:        4.80 cm 2.13 cm/m LA Vol (A2C):   61.4 ml 27.24 ml/m LA Vol (A4C):   66.2 ml 29.37 ml/m LA Biplane Vol: 64.0 ml 28.39 ml/m  AORTIC VALVE AV Area (Vmax):    2.46 cm AV Area (Vmean):   2.39 cm AV Area (VTI):     2.54 cm AV Vmax:           139.67 cm/s AV Vmean:          96.500 cm/s AV VTI:            0.261 m AV Peak Grad:      7.8 mmHg AV Mean Grad:      4.3 mmHg LVOT Vmax:         90.27 cm/s LVOT Vmean:        60.767 cm/s LVOT VTI:          0.174 m LVOT/AV VTI ratio: 0.67  AORTA Ao Root diam: 3.40 cm Ao Asc diam:  3.50 cm MITRAL VALVE               TRICUSPID VALVE MV Area (PHT): 4.06 cm    TR  Peak grad:   21.3 mmHg MV Decel Time: 187 msec    TR Vmax:        231.00 cm/s MV E velocity: 98.50 cm/s                            SHUNTS                            Systemic VTI:  0.17 m                            Systemic Diam: 2.20 cm Oswaldo Milian MD Electronically signed by Oswaldo Milian MD Signature Date/Time: 09/09/2021/7:18:44 PM    Final      Subjective: No acute issues or events overnight, denies headache fever chills shortness of breath chest pain nausea vomiting diarrhea constipation   Discharge Exam: Vitals:   09/12/21 0100 09/12/21 0622  BP:  114/77  Pulse:  75  Resp: 19 18  Temp:  98.6 F (37 C)  SpO2:  93%   Vitals:   09/11/21 2002 09/12/21 0000  09/12/21 0100 09/12/21 0622  BP: 125/85   114/77  Pulse: 84   75  Resp: _0 Temp: 98 F (36.7 C)   98.6 F (37 C)  TempSrc: Oral   Oral  SpO2: 96%   93%  Weight:      Height:        General: Pt is alert, awake, not in acute distress Cardiovascular: RRR, S1/S2 +, no rubs, no gallops Respiratory: CTA bilaterally, no wheezing, no rhonchi Abdominal: Soft, NT, ND, bowel sounds + Extremities: no edema, no cyanosis    The results of significant diagnostics from this hospitalization (including imaging, microbiology, ancillary and laboratory) are listed below for reference.     Microbiology: Recent Results (from the past 240 hour(s))  Resp Panel by RT-PCR (Flu A&B, Covid) Nasopharyngeal Swab     Status: None   Collection Time: 09/07/21  6:39 AM   Specimen: Nasopharyngeal Swab; Nasopharyngeal(NP) swabs in vial transport medium  Result Value Ref Range Status   SARS Coronavirus 2 by RT PCR NEGATIVE NEGATIVE Final    Comment: (NOTE) SARS-CoV-2 target nucleic acids are NOT DETECTED.  The SARS-CoV-2 RNA is generally detectable in upper respiratory specimens during the acute phase of infection. The lowest concentration of SARS-CoV-2 viral copies this assay can detect is 138 copies/mL. A negative result does not preclude SARS-Cov-2 infection and should not be used as the sole basis for treatment or other patient management decisions. A negative result may occur with  improper specimen collection/handling, submission of specimen other than nasopharyngeal swab, presence of viral mutation(s) within the areas targeted by this assay, and inadequate number of viral copies(<138 copies/mL). A negative result must be combined with clinical observations, patient history, and epidemiological information. The expected result is Negative.  Fact Sheet for Patients:  EntrepreneurPulse.com.au  Fact Sheet for Healthcare Providers:   IncredibleEmployment.be  This test is no t yet approved or cleared by the [place] FDA and  has been authorized for detection and/or diagnosis of SARS-CoV-2 by FDA under an Emergency Use Authorization (EUA). This EUA will remain  in effect (meaning this test can be used) for the duration of the COVID-19 declaration under Section 564(b)(1) of the Act, 21 U.S.C.section 360bbb-3(b)(1), unless the authorization is terminated  or revoked sooner.  Influenza A by PCR NEGATIVE NEGATIVE Final   Influenza B by PCR NEGATIVE NEGATIVE Final    Comment: (NOTE) The Xpert Xpress SARS-CoV-2/FLU/RSV plus assay is intended as an aid in the diagnosis of influenza from Nasopharyngeal swab specimens and should not be used as a sole basis for treatment. Nasal washings and aspirates are unacceptable for Xpert Xpress SARS-CoV-2/FLU/RSV testing.  Fact Sheet for Patients: EntrepreneurPulse.com.au  Fact Sheet for Healthcare Providers: IncredibleEmployment.be  This test is not yet approved or cleared by the [place] FDA and has been authorized for detection and/or diagnosis of SARS-CoV-2 by FDA under an Emergency Use Authorization (EUA). This EUA will remain in effect (meaning this test can be used) for the duration of the COVID-19 declaration under Section 564(b)(1) of the Act, 21 U.S.C. section 360bbb-3(b)(1), unless the authorization is terminated or revoked.  Performed at [facility], [place] [address], [place], [place] [address]      Labs: BNP (last 3 results) Recent Labs    09/06/21 1553  BNP 875.6*   Basic Metabolic Panel: Recent Labs  Lab 09/06/21 1553 09/07/21 0500 09/07/21 1909 09/08/21 0513 09/10/21 0417  NA 137 136 137 137 138  K 4.0 3.6 4.0 4.5 4.0  CL 103 101 105 106 109  CO2 21* _0 GLUCOSE 164* 163* 153* 159* 145*  BUN 26* _1 CREATININE 1.17 0.92 0.96 0.91 0.88   CALCIUM 8.3* 7.9* 7.9* 7.7* 7.7*  MG  --   --  1.7  --   --    Liver Function Tests: Recent Labs  Lab 09/06/21 1553  AST 20  ALT 14  ALKPHOS 83  BILITOT 1.0  PROT 6.9  ALBUMIN 2.5*   No results for input(s): LIPASE, AMYLASE in the last 168 hours. No results for input(s): AMMONIA in the last 168 hours. CBC: Recent Labs  Lab 09/06/21 1553 09/07/21 0500 09/07/21 1909 09/08/21 0513 09/09/21 0441 09/10/21 0417 09/11/21 0421  WBC 7.6   < > 7.9 7.1 6.1 6.0 6.3  NEUTROABS 6.0  --   --   --   --   --   --   HGB 8.9*   < > 8.6* 8.6* 8.0* 8.2* 8.8*  HCT 28.6*   < > 28.0* 28.6* 26.6* 26.9* 27.9*  MCV 83.9   < > 87.0 85.6 86.4 86.8 84.3  PLT 264   < > 247 250 224 215 240   < > = values in this interval not displayed.   Cardiac Enzymes: No results for input(s): CKTOTAL, CKMB, CKMBINDEX, TROPONINI in the last 168 hours. BNP: Invalid input(s): POCBNP CBG: Recent Labs  Lab 09/11/21 1155 09/11/21 1553 09/11/21 2027 09/12/21 0721 09/12/21 1114  GLUCAP 135* 143* 169* 134* 130*   D-Dimer No results for input(s): DDIMER in the last 72 hours. Hgb A1c No results for input(s): HGBA1C in the last 72 hours. Lipid Profile No results for input(s): CHOL, HDL, LDLCALC, TRIG, CHOLHDL, LDLDIRECT in the last 72 hours. Thyroid function studies No results for input(s): TSH, T4TOTAL, T3FREE, THYROIDAB in the last 72 hours.  Invalid input(s): FREET3  Anemia work up No results for input(s): VITAMINB12, FOLATE, FERRITIN, TIBC, IRON, RETICCTPCT in the last 72 hours. Urinalysis    Component Value Date/Time   COLORURINE YELLOW 09/08/2021 1534   APPEARANCEUR CLEAR 09/08/2021 1534   LABSPEC 1.021 09/08/2021 1534   PHURINE 5.0 09/08/2021 1534   GLUCOSEU >=500 (A) 09/08/2021 1534   GLUCOSEU >=1000 (A) 11/22/2019  [place] 09/08/2021 [place] 09/08/2021 1534   BILIRUBINUR neg 12/14/2013 0810   KETONESUR NEGATIVE 09/08/2021 1534   PROTEINUR 30 (A) 09/08/2021  1534   UROBILINOGEN 0.2 11/22/2019 0850   NITRITE NEGATIVE 09/08/2021 1534   LEUKOCYTESUR MODERATE (A) 09/08/2021 1534   Sepsis Labs Invalid input(s): PROCALCITONIN,  WBC,  LACTICIDVEN Microbiology Recent Results (from the past 240 hour(s))  Resp Panel by RT-PCR (Flu A&B, Covid) Nasopharyngeal Swab     Status: None   Collection Time: 09/07/21  6:39 AM   Specimen: Nasopharyngeal Swab; Nasopharyngeal(NP) swabs in vial transport medium  Result Value Ref Range Status   SARS Coronavirus 2 by RT PCR NEGATIVE NEGATIVE Final    Comment: (NOTE) SARS-CoV-2 target nucleic acids are NOT DETECTED.  The SARS-CoV-2 RNA is generally detectable in upper respiratory specimens during the acute phase of infection. The lowest concentration of SARS-CoV-2 viral copies this assay can detect is 138 copies/mL. A negative result does not preclude SARS-Cov-2 infection and should not be used as the sole basis for treatment or other patient management decisions. A negative result may occur with  improper specimen collection/handling, submission of specimen other than nasopharyngeal swab, presence of viral mutation(s) within the areas targeted by this assay, and inadequate number of viral copies(<138 copies/mL). A negative result must be combined with clinical observations, patient history, and epidemiological information. The expected result is Negative.  Fact Sheet for Patients:  EntrepreneurPulse.com.au  Fact Sheet for Healthcare Providers:  IncredibleEmployment.be  This test is no t yet approved or cleared by the [place] FDA and  has been authorized for detection and/or diagnosis of SARS-CoV-2 by FDA under an Emergency Use Authorization (EUA). This EUA will remain  in effect (meaning this test can be used) for the duration of the COVID-19 declaration under Section 564(b)(1) of the Act, 21 U.S.C.section 360bbb-3(b)(1), unless the authorization is terminated  or  revoked sooner.       Influenza A by PCR NEGATIVE NEGATIVE Final   Influenza B by PCR NEGATIVE NEGATIVE Final    Comment: (NOTE) The Xpert Xpress SARS-CoV-2/FLU/RSV plus assay is intended as an aid in the diagnosis of influenza from Nasopharyngeal swab specimens and should not be used as a sole basis for treatment. Nasal washings and aspirates are unacceptable for Xpert Xpress SARS-CoV-2/FLU/RSV testing.  Fact Sheet for Patients: EntrepreneurPulse.com.au  Fact Sheet for Healthcare Providers: IncredibleEmployment.be  This test is not yet approved or cleared by the [place] FDA and has been authorized for detection and/or diagnosis of SARS-CoV-2 by FDA under an Emergency Use Authorization (EUA). This EUA will remain in effect (meaning this test can be used) for the duration of the COVID-19 declaration under Section 564(b)(1) of the Act, 21 U.S.C. section 360bbb-3(b)(1), unless the authorization is terminated or revoked.  Performed at [facility], [place] [address], [place], [place] [address]      Time coordinating discharge: Over 30 minutes  SIGNED:   Little Ishikawa, DO [facility] 09/12/2021, 2:55 PM Pager   If 7PM-7AM, please contact night-coverage [email or web address]

## 2021-09-11 NOTE — Plan of Care (Signed)
Discussed with patient upon arrival and walked to the bathroom prior to report was told by patient he was independent and hadn't received report at this time.    Problem: Education: Goal: Knowledge of General Education information will improve Description: Including pain rating scale, medication(s)/side effects and non-pharmacologic comfort measures Outcome: Progressing   Problem: Health Behavior/Discharge Planning: Goal: Ability to manage health-related needs will improve Outcome: Progressing

## 2021-09-11 NOTE — Progress Notes (Signed)
Went over bedtime medications, offered heat or ice pack for back which patient declined at this time.

## 2021-09-11 NOTE — Progress Notes (Signed)
° ° °  OVERNIGHT PROGRESS REPORT   Notified by RN for patient fall. Interviewed patient at bedside, patient stated that he had been getting up on his own throughout his stay here he leaned over to pick up something from the floor and felt his weight forward catching himself on his hands and knees.  He states he did not hit his head, he did not lose consciousness, and he was not dizzy at any time. He attributes his difficulty to recently discontinued/added treatments and loss of muscle mass that he states is ongoing.  On examination he has a small abrasion to his right knee roughly the size of a nickel. He has no other deformities, contusions, abrasions, tears, lacerations. He has full pulse, motor, sensation, of all 4 extremities. He has range of motion, at baseline, of all 4 extremities.  He declines any imaging at this time. Patient is awake and oriented x4, conversational, and has no complaints at this time. I spoke with him for a while tonight and advised him if he felt any new complaints or symptoms to not hesitate to let the Staff know and we will revisit him.  Likewise his monitoring status was upgraded per floor/ hospital protocol for MEWS.   Gershon Cull MSNA MSN ACNPC-AG Acute Care Nurse Practitioner [place]

## 2021-09-12 DIAGNOSIS — I4819 Other persistent atrial fibrillation: Secondary | ICD-10-CM | POA: Diagnosis not present

## 2021-09-12 LAB — TYPE AND SCREEN
ABO/RH(D): A NEG
Antibody Screen: NEGATIVE
Unit division: 0

## 2021-09-12 LAB — BPAM RBC
Blood Product Expiration Date: 202301312359
ISSUE DATE / TIME: 202301110006
Unit Type and Rh: 600

## 2021-09-12 LAB — GLUCOSE, CAPILLARY
Glucose-Capillary: 134 mg/dL — ABNORMAL HIGH (ref 70–99)
Glucose-Capillary: 130 mg/dL — ABNORMAL HIGH (ref 70–99)

## 2021-09-12 LAB — PROTIME-INR
INR: 2.5 — ABNORMAL HIGH (ref 0.8–1.2)
Prothrombin Time: 27.3 seconds — ABNORMAL HIGH (ref 11.4–15.2)

## 2021-09-12 MED ORDER — AMIODARONE HCL 200 MG PO TABS: 200.0000 mg | ORAL_TABLET | Freq: Every day | ORAL | 0 refills | Status: DC

## 2021-09-12 MED ORDER — WARFARIN SODIUM 5 MG PO TABS: 5.0000 mg | ORAL_TABLET | Freq: Every day | ORAL | 0 refills | Status: DC

## 2021-09-12 MED ORDER — AMIODARONE HCL 400 MG PO TABS: 400.0000 mg | ORAL_TABLET | Freq: Two times a day (BID) | ORAL | 0 refills | Status: DC

## 2021-09-12 MED ORDER — WARFARIN SODIUM 5 MG PO TABS: 5.0000 mg | ORAL_TABLET | Freq: Once | ORAL | Status: DC

## 2021-09-12 MED ORDER — FUROSEMIDE 40 MG PO TABS: 40.0000 mg | ORAL_TABLET | Freq: Every day | ORAL | 0 refills | Status: DC

## 2021-09-12 NOTE — Progress Notes (Addendum)
ANTICOAGULATION CONSULT NOTE - Follow Up Consult  Pharmacy Consult for warfarin Indication: hx atrial fibrillation and DVT  Allergies  Allergen Reactions   Shingrix [Zoster Vac Recomb Adjuvanted] Shortness Of Breath and Other (See Comments)    Pulse increased to 170+ and it affected the A-Fib   Other     shingls shot caused atrial fib did not take 2nd shot    Patient Measurements: Height: 5\' 11"  (180.3 cm) Weight: 106.1 kg (234 lb) IBW/kg (Calculated) : 75.3 Heparin Dosing Weight:   Vital Signs: Temp: 98.6 F (37 C) (01/12 0622) Temp Source: Oral (01/12 0622) BP: 114/77 (01/12 0622) Pulse Rate: 75 (01/12 0622)  Labs: Recent Labs    09/10/21 0417 09/10/21 0644 09/11/21 0421 09/12/21 0411  HGB 8.2*  --  8.8*  --   HCT 26.9*  --  27.9*  --   PLT 215  --  240  --   LABPROT  --  28.9* 25.3* 27.3*  INR  --  2.7* 2.3* 2.5*  CREATININE 0.88  --   --   --      Estimated Creatinine Clearance: 99.5 mL/min (by C-G formula based on SCr of 0.88 mg/dL).   Medications:  The following information obtained from [facility] clinic notes: -Home dose: Warfarin 7.5 mg M,F; 5 mg all other days of the week -Dose recently reduced on 09/03/21 after a supratherapeutic INR reading of >10 on 08/30/21 while taking 7.5 mg daily  Assessment: Patient is a 71 y.o M with hx metastatic cancer, DVT and afib on warfarin PTA, presented to the ED on 09/06/21 with c/o SOB and dizziness. Chest CT on 09/06/21 was negative for PE.  Warfarin resumed on admission.  Today, 09/12/2021: - INR is therapeutic at 2.5 - cbc stable with labs on 1/11 - no bleeding documented - signif drug-drug intxns: amiodarone can increase INR   Goal of Therapy:  INR 2-3 Monitor platelets by anticoagulation protocol: Yes   Plan:  -  repeat warfarin 5 mg PO x1 today - daily INR - monitor for s/sx bleeding - If to discharge pt today, recom warfarin 5mg  daily until he's able to get his INR check at the his outpatient [facility] clinic since  amiodarone has signif. drug-drug intxns with warfarin and PTA warfarin dose will likely need to be reduced.  Haevyn Ury P 09/12/2021,9:18 AM

## 2021-09-12 NOTE — Progress Notes (Signed)
Patient refused lab draw this morning.  Lab and I talked to him to let him know the MD needs this information to discharge him.  Agreed to labs

## 2021-09-12 NOTE — Progress Notes (Signed)
Discharge AVS and instructions reviewed with patient. He verbalizes understanding. Pt states his wife will come to pick him up for discharge close to 3pm. Will continue to monitor. Coolidge Breeze, RN 09/12/2021

## 2021-09-13 ENCOUNTER — Telehealth: Payer: Self-pay

## 2021-09-13 NOTE — Telephone Encounter (Signed)
Lpm to call and schedule INR check

## 2021-09-13 NOTE — Telephone Encounter (Signed)
Transition Care Management Unsuccessful Follow-up Telephone Call  Date of discharge and from where:  [facility] 09/12/2021  Attempts:  1st Attempt  Reason for unsuccessful TCM follow-up call:  Unable to reach patient

## 2021-09-16 ENCOUNTER — Telehealth: Payer: Self-pay

## 2021-09-16 ENCOUNTER — Telehealth: Payer: Self-pay | Admitting: *Deleted

## 2021-09-16 ENCOUNTER — Ambulatory Visit (HOSPITAL_COMMUNITY): Payer: BC Managed Care – PPO

## 2021-09-16 NOTE — Telephone Encounter (Signed)
CALLED PATIENT TO REMIND OF XOFIGO INJ. FOR 09-17-21- ARRIVAL TIME- 7:45 AM @ [facility], SPOKE WITH PATIENT AND HE IS AWARE OF THIS INJ.

## 2021-09-16 NOTE — Telephone Encounter (Signed)
Transition Care Management Unsuccessful Follow-up Telephone Call  Date of discharge and from where:  [facility] 09/12/2021  Attempts:  2nd Attempt  Reason for unsuccessful TCM follow-up call:  Unable to leave message

## 2021-09-16 NOTE — Telephone Encounter (Signed)
Lpm to call and schedule INR appointment.

## 2021-09-17 ENCOUNTER — Other Ambulatory Visit: Payer: Self-pay

## 2021-09-17 ENCOUNTER — Ambulatory Visit (HOSPITAL_COMMUNITY)
Admission: RE | Admit: 2021-09-17 | Discharge: 2021-09-17 | Disposition: A | Discharge status: Home / Self Care | Payer: BC Managed Care – PPO | Source: Ambulatory Visit | Attending: Radiation Oncology | Admitting: Radiation Oncology

## 2021-09-17 DIAGNOSIS — C61 Malignant neoplasm of prostate: Secondary | ICD-10-CM | POA: Diagnosis not present

## 2021-09-17 DIAGNOSIS — C7951 Secondary malignant neoplasm of bone: Secondary | ICD-10-CM | POA: Insufficient documentation

## 2021-09-17 MED ORDER — RADIUM RA 223 DICHLORIDE 30 MCCI/ML IV SOLN
162.4000 | Freq: Once | INTRAVENOUS | Status: AC | PRN
  Administered 2021-09-17: 157.7 via INTRAVENOUS

## 2021-09-19 ENCOUNTER — Telehealth: Payer: Self-pay | Admitting: *Deleted

## 2021-09-19 ENCOUNTER — Other Ambulatory Visit (HOSPITAL_COMMUNITY): Payer: Self-pay | Admitting: Radiation Oncology

## 2021-09-19 DIAGNOSIS — C7951 Secondary malignant neoplasm of bone: Secondary | ICD-10-CM

## 2021-09-19 NOTE — Telephone Encounter (Signed)
CALLED PATIENT TO INFORM OF LAB ON 10-11-21 @ 8:30 AM @ [facility]. ON 10-18-21 @ 8 AM @ [facility], SPOKE WITH PATIENT AND HE IS AWARE OF THESE APPTS.

## 2021-09-23 ENCOUNTER — Other Ambulatory Visit: Payer: Self-pay

## 2021-09-23 ENCOUNTER — Ambulatory Visit (INDEPENDENT_AMBULATORY_CARE_PROVIDER_SITE_OTHER): Payer: BC Managed Care – PPO

## 2021-09-23 DIAGNOSIS — Z7901 Long term (current) use of anticoagulants: Secondary | ICD-10-CM

## 2021-09-23 DIAGNOSIS — I4819 Other persistent atrial fibrillation: Secondary | ICD-10-CM

## 2021-09-23 LAB — POCT INR: INR: 2 (ref 2.0–3.0)

## 2021-09-23 NOTE — Patient Instructions (Signed)
Continue 1 tablet daily except for 1.5 tablets each Monday and Friday.  Repeat INR in 4 weeks

## 2021-09-24 ENCOUNTER — Ambulatory Visit (HOSPITAL_COMMUNITY)
Admission: RE | Admit: 2021-09-24 | Discharge: 2021-09-24 | Disposition: A | Discharge status: Home / Self Care | Payer: BC Managed Care – PPO | Source: Ambulatory Visit | Attending: Nurse Practitioner | Admitting: Nurse Practitioner

## 2021-09-24 ENCOUNTER — Encounter (HOSPITAL_COMMUNITY): Payer: Self-pay | Admitting: Nurse Practitioner

## 2021-09-24 VITALS — BP 118/68 | HR 124 | Ht 71.0 in | Wt 234.0 lb

## 2021-09-24 DIAGNOSIS — I4821 Permanent atrial fibrillation: Secondary | ICD-10-CM

## 2021-09-24 DIAGNOSIS — Z79899 Other long term (current) drug therapy: Secondary | ICD-10-CM | POA: Diagnosis not present

## 2021-09-24 DIAGNOSIS — R300 Dysuria: Secondary | ICD-10-CM | POA: Insufficient documentation

## 2021-09-24 DIAGNOSIS — D6869 Other thrombophilia: Secondary | ICD-10-CM | POA: Diagnosis not present

## 2021-09-24 DIAGNOSIS — Z7901 Long term (current) use of anticoagulants: Secondary | ICD-10-CM | POA: Insufficient documentation

## 2021-09-24 DIAGNOSIS — I10 Essential (primary) hypertension: Secondary | ICD-10-CM | POA: Insufficient documentation

## 2021-09-24 DIAGNOSIS — C61 Malignant neoplasm of prostate: Secondary | ICD-10-CM | POA: Insufficient documentation

## 2021-09-24 LAB — COMPREHENSIVE METABOLIC PANEL
ALT: 19 U/L (ref 0–44)
AST: 19 U/L (ref 15–41)
Albumin: 1.8 g/dL — ABNORMAL LOW (ref 3.5–5.0)
Alkaline Phosphatase: 79 U/L (ref 38–126)
Anion gap: 12 (ref 5–15)
BUN: 18 mg/dL (ref 8–23)
CO2: 22 mmol/L (ref 22–32)
Calcium: 8.8 mg/dL — ABNORMAL LOW (ref 8.9–10.3)
Chloride: 107 mmol/L (ref 98–111)
Creatinine, Ser: 0.93 mg/dL (ref 0.61–1.24)
GFR, Estimated: >60 mL/min (ref >60)
Glucose, Bld: 84 mg/dL (ref 70–99)
Potassium: 4.5 mmol/L (ref 3.5–5.1)
Sodium: 141 mmol/L (ref 135–145)
Total Bilirubin: 0.5 mg/dL (ref 0.3–1.2)
Total Protein: 6.1 g/dL — ABNORMAL LOW (ref 6.5–8.1)

## 2021-09-24 LAB — CBC
HCT: 26.4 % — ABNORMAL LOW (ref 39.0–52.0)
Hemoglobin: 8.3 g/dL — ABNORMAL LOW (ref 13.0–17.0)
MCH: 26.9 pg (ref 26.0–34.0)
MCHC: 31.4 g/dL (ref 30.0–36.0)
MCV: 85.4 fL (ref 80.0–100.0)
Platelets: 249 10*3/uL (ref 150–400)
RBC: 3.09 MIL/uL — ABNORMAL LOW (ref 4.22–5.81)
RDW: 19.1 % — ABNORMAL HIGH (ref 11.5–15.5)
WBC: 5.8 10*3/uL (ref 4.0–10.5)
nRBC: 0 % (ref 0.0–0.2)

## 2021-09-24 MED ORDER — LOSARTAN POTASSIUM 50 MG PO TABS: 50.0000 mg | ORAL_TABLET | Freq: Every morning | ORAL | Status: DC

## 2021-09-24 MED ORDER — TAMSULOSIN HCL 0.4 MG PO CAPS: 0.4000 mg | ORAL_CAPSULE | Freq: Two times a day (BID) | ORAL | Status: AC

## 2021-09-24 NOTE — Progress Notes (Addendum)
Date:  09/24/2021   ID:  Tommy Yang, DOB 12-11-1950, MRN [id number]  Location: home   Provider location: [address] [place], [place] [address] Evaluation Performed:Follow up   PCP:  [name], MD  Primary Cardiologist:  None  Primary Electrophysiologist: none   BD:ZHGDJM fibrillation   History of Present Illness: Tommy Yang is a 71 y.o. male who presents for f/u today. He has longstanding persistent afib. He feels he may have had afib for 2 years befor he was diagnosed.  On previous visits, he has been offered AAD therapy which he has deferred as he feels he is minimally symptomatic. He does have some snoring. Possible apnea, but  pt states he would not be able to deal with a mask to treat sleep apnea. He scratched  his lower leg and being diabetic, has an appointment with the wound center to make sure it is healing.  6 month f/u in afib clinic, 09/03/19. He is in rate controlled afib. He reports going to ER  In September after the flu shot with afib with RVR. He was rate controlled with IV Cardizem and discharged home on same meds. He is having issues now with urinary retention with h/o prostate CA. He is wearing a urinary catheter. He is due to go back to urologist tomorrow for removal but if he can not urinate , he was told he will have to have surgery. He is having intermittent hematuria now. His prior leg wound did heal. Continues on xarelto with a CHA2DS2VASc score of 5.he is on tumeric and fish oil which may be contributing to hematuria.  In afib clinic, 10/05/19. He had post chanel TURP on 09/30/19 and had to go back to ER yesterday  to have foley reinserted after not being able to void since it was removed earlier that am. Urine had clots in the ER but was irrigated to pink color and remains that way today. He is back on xarelto. Pt asked to be seen today after increase in HR into the 120-130's and has noted swelling in his lower extremities. He does not describe   any PND/orthopnea. He feels nervous with the increase of HR and short of breath with exertion.[name] overall weight is down one lb today but his LE's are swollen.  F/u in afib clinic, 10/10/19. He feels better with lasix and addition of Cardizem to slow HR. He took lasix x 5 days and now his weight is back down to his normal. He will stay on Cardizem. Bmet drawn today.He continues with foley cath with pinkish urine and sees urologist tomorrow.   F/u 11/08/19. His catheter has been removed and he is doing much better with voiding. No recent hematuria. His heart rate is now controlled afib back in the  80's and he stopped taking the diltiazem daily and went back on amlodipine. BP well controlled.   F/u in afib clinic, 09/14/20. He continues in permanent afib, rate controlled at 68 bpm. . He has developed some dysuria and has the foley cath with leg bag back in.  No hematuria. He did have some infection and finishes antibiotic tomorrow. Otherwise no complaints.   F/u in afib clinic, 09/24/21. He was in the hospital early January for afib with RVR. The RVR was  thought to be secondary to his cancer progression and change in chemotherapy with side effects of weight loss and anemia. He was started on amiodarone for rate control. As of today,  he has lowered his amio to 200 mg daily. Unfortunately, his HR is 123 bpm today, but he has not taken his am meds. He has increased his lasix to 80 mg for the last few days. He has no way of monitoring at home to check his heart rate. We discussed a zio patch for one week to check rate control and he is in agreement. He continues with foley cath in place.    Today, he denies symptoms of palpitations, chest pain, shortness of breath, orthopnea, PND, lower extremity edema, claudication, dizziness, presyncope, syncope, bleeding, or neurologic sequela. The patient is tolerating medications without difficulties and is otherwise without complaint today.   he denies symptoms of cough,  fevers, chills, or new SOB worrisome for COVID 19.    There were no vitals filed for this visit.   Past Medical History:  Diagnosis Date   Arthritis    Atrial fibrillation ([place])    Complication of anesthesia    needs to sit up  at 45 degree angle or gets afib during surgery or in recovery   Diabetes mellitus without complication ([place])    type 2   DVT (deep venous thrombosis) ([place]) 2015   left leg.behind knee   Foley catheter in place last changed 2eeks ago   last 7 months   GERD (gastroesophageal reflux disease)    Hx of radiation therapy 40 tx 2015   Hypercholesterolemia    Hypertension    Prostate cancer ([place]) 12/06/13   gleason 4+3=7, 6/12 cores positive. seed implant and radiation   Tourette's syndrome    Urinary retention    Wears glasses    Past Surgical History:  Procedure Laterality Date   APPENDECTOMY  1968   BALLOON DILATION N/A 08/02/2015   Procedure: BALLOON DILATION;  Surgeon: [name], MD;  Location: [facility];  Service: Endoscopy;  Laterality: N/A;   CARDIOVERSION N/A 05/13/2018   Procedure: CARDIOVERSION;  Surgeon: Sanda Klein, MD;  Location: [facility];  Service: Cardiovascular;  Laterality: N/A;   CARDIOVERSION N/A 05/18/2018   Procedure: CARDIOVERSION;  Surgeon: Buford Dresser, MD;  Location: [facility];  Service: Cardiovascular;  Laterality: N/A;   COLONOSCOPY WITH PROPOFOL N/A 08/02/2015   Procedure: COLONOSCOPY WITH PROPOFOL w/ APC;  Surgeon: [name], MD;  Location: [facility];  Service: Endoscopy;  Laterality: N/A;   CYSTOSCOPY N/A 10/05/2020   Procedure: CYSTOSCOPY;  Surgeon: Alexis Frock, MD;  Location[facility];  Service: Urology;  Laterality: N/A;   ESOPHAGOGASTRODUODENOSCOPY (EGD) WITH PROPOFOL N/A 08/02/2015   Procedure: ESOPHAGOGASTRODUODENOSCOPY (EGD) WITH PROPOFOL/ possible dilation.;  Surgeon: [name], MD;  Location: [facility];  Service: Endoscopy;  Laterality: N/A;   KNEE SURGERY   2001   right knee orthoscopic   PROSTATE BIOPSY  12/06/13   Gleason 4+3=7, volume 35 gm   SPINE SURGERY  1963 and 1967   Tourette'ssyndrome spinal fluid removed    TONSILLECTOMY  as child   TRANSURETHRAL RESECTION OF PROSTATE N/A 09/30/2019   Procedure: TRANSURETHRAL RESECTION OF THE PROSTATE (TURP);  Surgeon: Alexis Frock, MD;  Location[facility];  Service: Urology;  Laterality: N/A;  1 HR   TRANSURETHRAL RESECTION OF PROSTATE N/A 10/05/2020   Procedure: TRANSURETHRAL RESECTION OF THE PROSTATE (TURP);  Surgeon: Alexis Frock, MD;  Location: [facility];  Service: Urology;  Laterality: N/A;  1 HR     Current Outpatient Medications  Medication Sig Dispense Refill   acetaminophen (TYLENOL) 500 MG tablet Take  500-1,000 mg by mouth every 6 (six) hours as needed (for pain).     amiodarone (PACERONE) 200 MG tablet Take 1 tablet (200 mg total) by mouth daily. 30 tablet 0   amiodarone (PACERONE) 400 MG tablet Take 1 tablet (400 mg total) by mouth 2 (two) times daily for 11 days. 22 tablet 0   atorvastatin (LIPITOR) 80 MG tablet TAKE 1 TABLET BY MOUTH EVERY DAY (Patient taking differently: Take 80 mg by mouth in the morning.) 90 tablet 0   blood glucose meter kit and supplies KIT Test blood sugar daily as directed. Dx code: E11.9 1 each 0   CALCIUM PO Take 1,000 mg by mouth daily.     Cholecalciferol (VITAMIN D3) 10 MCG (400 UNIT) CAPS Take 400 Units by mouth daily.     Cyanocobalamin (VITAMIN B 12 PO) Take 1,000 mcg by mouth daily.     diphenoxylate-atropine (LOMOTIL) 2.5-0.025 MG tablet TAKE 1 TABLET BY MOUTH 4 (FOUR) TIMES DAILY AS NEEDED FOR DIARRHEA OR LOOSE STOOLS. 40 tablet 1   docusate sodium (COLACE) 100 MG capsule Take 1 capsule (100 mg total) by mouth 2 (two) times daily. 60 capsule 11   Empagliflozin-metFORMIN HCl ER (SYNJARDY XR) 12.12-998 MG TB24 TAKE 2 TABLETS BY MOUTH EVERY DAY (Patient taking differently: Take 1 tablet by mouth in the morning and at  bedtime.) 180 tablet 3   ERLEADA 60 MG tablet Take 240 mg by mouth daily.     furosemide (LASIX) 40 MG tablet Take 1 tablet (40 mg total) by mouth daily. 30 tablet 0   glimepiride (AMARYL) 1 MG tablet TAKE 1 TABLET BY MOUTH EVERY DAY WITH BREAKFAST (Patient taking differently: Take 1 mg by mouth at bedtime.) 90 tablet 0   glucose blood (ACCU-CHEK GUIDE) test strip Use as instructed to check blood sugar twice daily. 100 each 3   leuprolide (LUPRON) 11.25 MG injection Inject 11.25 mg into the muscle every 6 (six) months.     losartan (COZAAR) 50 MG tablet 1 TABLET IN THE MORNING AND HALF TABLET IN THE EVENING (Patient taking differently: Take 50 mg by mouth in the morning.) 135 tablet 1   metoprolol tartrate (LOPRESSOR) 100 MG tablet Take 1 tablet (100 mg total) by mouth 2 (two) times daily. Appointment Required For Further Refills [phone] 180 tablet 0   pantoprazole (PROTONIX) 40 MG tablet TAKE 1 TABLET BY MOUTH EVERY DAY (Patient taking differently: Take 40 mg by mouth daily before breakfast.) 90 tablet 3   Semaglutide,0.25 or 0.5MG/DOS, (OZEMPIC, 0.25 OR 0.5 MG/DOSE,) 2 MG/1.5ML SOPN Inject 0.5 mg into the skin once a week. (Patient taking differently: Inject 1 mg into the skin every Friday.) 4.5 mL 2   tamsulosin (FLOMAX) 0.4 MG CAPS capsule Take 1 capsule (0.4 mg total) by mouth daily. (Patient taking differently: Take 0.4 mg by mouth 2 (two) times daily.) 30 capsule 30   warfarin (COUMADIN) 5 MG tablet Take 1 tablet (5 mg total) by mouth daily after breakfast. 30 tablet 0   zinc gluconate 50 MG tablet Take 50 mg by mouth daily.     No current facility-administered medications for this encounter.    Allergies:   Shingrix [zoster vac recomb adjuvanted] and Other   Social History:  The patient  reports that he quit smoking about 9 years ago. His smoking use included cigarettes. He has a 11.50 pack-year smoking history. He has never used smokeless tobacco. He reports that he does not  currently use alcohol. He reports that  he does not use drugs.   Family History:  The patient's  family history includes COPD in his brother; Cancer in his brother; Diabetes in his brother, father, and mother; Heart disease in his father and mother; Hyperlipidemia in his brother, father, and mother; Hypertension in his brother, father, and mother; Varicose Veins in his mother.    ROS:  Please see the history of present illness.   All other systems are personally reviewed and negative.   Exam:General:  No distress Eyes: EOMI, anicteric ENT: Oral Mucosa clear and moist Cardiovascular:rapid irregular rhythm, no murmurs, rubs or gallops, no edema, Respiratory: Normal respiratory effort on room air, lungs clear to auscultation bilaterally Abdomen: soft, non-distended, non-tender, normal bowel sounds. Wearing a urinary catheter with blood tinged urine. Skin: No Rash Neurologic: Grossly no focal neuro deficit.Mental status AAOx3, speech normal, Psychiatric:Appropriate affect, and mood   Recent Labs: 09/06/2021: ALT 14; B Natriuretic Peptide 219.2 09/07/2021: Magnesium 1.7 09/09/2021: TSH 1.574 09/10/2021: BUN 15; Creatinine, Ser 0.88; Potassium 4.0; Sodium 138 09/11/2021: Hemoglobin 8.8; Platelets 240  personally reviewed    Other studies personally reviewed: Epic records reviewed EKG- afib at 124 bpm, qrs int 68 ms, qtc 488 ms  Echo- 1. Left ventricular ejection fraction, by estimation, is 60 to 65%. The  left ventricle has normal function. The left ventricle has no regional  wall motion abnormalities. There is mild left ventricular hypertrophy.  Left ventricular diastolic parameters  are indeterminate.   2. Right ventricular systolic function is normal. The right ventricular  size is normal. There is normal pulmonary artery systolic pressure. The  estimated right ventricular systolic pressure is 68.1 mmHg.   3. Right atrial size was mildly dilated.   4. The mitral valve is normal in  structure. Trivial mitral valve  regurgitation.   5. The aortic valve is tricuspid. Aortic valve regurgitation is not  visualized. Aortic valve sclerosis is present, with no evidence of aortic  valve stenosis.   6. The inferior vena cava is dilated in size with >50% respiratory  variability, suggesting right atrial pressure of 8 mmHg.   FINDINGS   Left Ventricle: Left ventricular ejection fraction, by estimation, is 60  to 65%. The left ventricle has normal function. The left ventricle has no  regional wall motion abnormalities. The left ventricular internal cavity  size was normal in size. There is   mild left ventricular hypertrophy. Left ventricular diastolic parameters  are indeterminate.     ASSESSMENT AND PLAN:  1. Permanent  atrial fibrillation Has been rate controlled and minimally asymptomatic for years  He recently has developed Afib with RVR , probably 2/2 to cancer treatment  Is not well rate controlled this am, has not taken am drugs, but will place a one week Zio patch  Is being compliant with amiodarone 200 mg daily Echo  in hospital showed normal EF Has short acting cardizem 30 mg if needed Continue metoprolol tartrate 100 mg bid  Continue  warfarin ( pt changed to warfarin to xarelto for chemo interactions)   This patients CHA2DS2-VASc Score and unadjusted Ischemic Stroke Rate (% per year) is equal to 7.2 % stroke rate/year from a score of 5 Cbc/cmet  2.HTN Off amlodipine for soft BP's Stable   3. Dysuria /prostate CA  Wearing  foley cath  Per urology   Follow-up: in one month   Current medicines are reviewed at length with the patient today.   The patient does not have concerns regarding his medicines.  The following  changes were made today:  none  Labs/ tests ordered today include:none Orders Placed This Encounter  Procedures   EKG 12-Lead    Signed, Roderic Palau, NP 09/24/2021 8:27 AM  Afib [facility] [address] [place], [place] [address] [phone]

## 2021-10-01 ENCOUNTER — Other Ambulatory Visit: Payer: Self-pay

## 2021-10-01 DIAGNOSIS — C61 Malignant neoplasm of prostate: Secondary | ICD-10-CM

## 2021-10-04 DIAGNOSIS — R338 Other retention of urine: Secondary | ICD-10-CM | POA: Diagnosis not present

## 2021-10-04 DIAGNOSIS — C61 Malignant neoplasm of prostate: Secondary | ICD-10-CM | POA: Diagnosis not present

## 2021-10-04 DIAGNOSIS — C7951 Secondary malignant neoplasm of bone: Secondary | ICD-10-CM | POA: Diagnosis not present

## 2021-10-10 ENCOUNTER — Other Ambulatory Visit: Payer: Self-pay

## 2021-10-10 ENCOUNTER — Telehealth: Payer: Self-pay | Admitting: *Deleted

## 2021-10-10 ENCOUNTER — Other Ambulatory Visit (INDEPENDENT_AMBULATORY_CARE_PROVIDER_SITE_OTHER): Payer: BC Managed Care – PPO

## 2021-10-10 DIAGNOSIS — E669 Obesity, unspecified: Secondary | ICD-10-CM

## 2021-10-10 DIAGNOSIS — E1169 Type 2 diabetes mellitus with other specified complication: Secondary | ICD-10-CM | POA: Diagnosis not present

## 2021-10-10 LAB — BASIC METABOLIC PANEL
BUN: 23 mg/dL (ref 6–23)
CO2: 25 mEq/L (ref 19–32)
Calcium: 7.6 mg/dL — ABNORMAL LOW (ref 8.4–10.5)
Chloride: 108 mEq/L (ref 96–112)
Creatinine, Ser: 0.88 mg/dL (ref 0.40–1.50)
GFR: 88.02 mL/min (ref >60.00)
Glucose, Bld: 102 mg/dL — ABNORMAL HIGH (ref 70–99)
Potassium: 4.7 mEq/L (ref 3.5–5.1)
Sodium: 142 mEq/L (ref 135–145)

## 2021-10-10 LAB — HEMOGLOBIN A1C: Hgb A1c MFr Bld: 6.9 % — ABNORMAL HIGH (ref 4.6–6.5)

## 2021-10-10 NOTE — Telephone Encounter (Signed)
Called patient to remind of lab and weight - arrival time- 8:15 am  on 10-11-21 @ [facility], spoke with patient and he is aware of these appts.

## 2021-10-11 ENCOUNTER — Encounter (HOSPITAL_COMMUNITY): Payer: Self-pay | Admitting: *Deleted

## 2021-10-11 ENCOUNTER — Ambulatory Visit
Admission: RE | Admit: 2021-10-11 | Discharge: 2021-10-11 | Disposition: A | Discharge status: Home / Self Care | Payer: BC Managed Care – PPO | Source: Ambulatory Visit | Attending: Radiation Oncology | Admitting: Radiation Oncology

## 2021-10-11 DIAGNOSIS — C7951 Secondary malignant neoplasm of bone: Secondary | ICD-10-CM | POA: Insufficient documentation

## 2021-10-11 DIAGNOSIS — C61 Malignant neoplasm of prostate: Secondary | ICD-10-CM | POA: Diagnosis not present

## 2021-10-11 LAB — CBC WITH DIFFERENTIAL (CANCER CENTER ONLY)
Abs Immature Granulocytes: 0.05 10*3/uL (ref 0.00–0.07)
Basophils Absolute: 0 10*3/uL (ref 0.0–0.1)
Basophils Relative: 1 %
Eosinophils Absolute: 0 10*3/uL (ref 0.0–0.5)
Eosinophils Relative: 1 %
HCT: 25.8 % — ABNORMAL LOW (ref 39.0–52.0)
Hemoglobin: 7.9 g/dL — ABNORMAL LOW (ref 13.0–17.0)
Immature Granulocytes: 1 %
Lymphocytes Relative: 13 %
Lymphs Abs: 0.5 10*3/uL — ABNORMAL LOW (ref 0.7–4.0)
MCH: 26.2 pg (ref 26.0–34.0)
MCHC: 30.6 g/dL (ref 30.0–36.0)
MCV: 85.7 fL (ref 80.0–100.0)
Monocytes Absolute: 0.4 10*3/uL (ref 0.1–1.0)
Monocytes Relative: 12 %
Neutro Abs: 2.8 10*3/uL (ref 1.7–7.7)
Neutrophils Relative %: 72 %
Platelet Count: 182 10*3/uL (ref 150–400)
RBC: 3.01 MIL/uL — ABNORMAL LOW (ref 4.22–5.81)
RDW: 21.7 % — ABNORMAL HIGH (ref 11.5–15.5)
WBC Count: 3.8 10*3/uL — ABNORMAL LOW (ref 4.0–10.5)
nRBC: 0 % (ref 0.0–0.2)

## 2021-10-15 ENCOUNTER — Other Ambulatory Visit: Payer: Self-pay | Admitting: Oncology

## 2021-10-15 ENCOUNTER — Ambulatory Visit: Payer: BC Managed Care – PPO | Admitting: Endocrinology

## 2021-10-15 ENCOUNTER — Telehealth: Payer: Self-pay | Admitting: Oncology

## 2021-10-15 ENCOUNTER — Encounter: Payer: Self-pay | Admitting: Endocrinology

## 2021-10-15 VITALS — BP 108/68 | HR 103 | Ht 71.0 in | Wt 226.4 lb

## 2021-10-15 DIAGNOSIS — E782 Mixed hyperlipidemia: Secondary | ICD-10-CM

## 2021-10-15 DIAGNOSIS — E1165 Type 2 diabetes mellitus with hyperglycemia: Secondary | ICD-10-CM | POA: Diagnosis not present

## 2021-10-15 DIAGNOSIS — C61 Malignant neoplasm of prostate: Secondary | ICD-10-CM

## 2021-10-15 DIAGNOSIS — D630 Anemia in neoplastic disease: Secondary | ICD-10-CM

## 2021-10-15 NOTE — Patient Instructions (Addendum)
Leave off Glimeperide until sugar >130 in am  Check blood sugars on waking up 2 days a week  Also check blood sugars about 2 hours after meals and do this after different meals by rotation  Recommended blood sugar levels on waking up are 90-130 and about 2 hours after meal is 130-160  Please bring your blood sugar monitor to each visit, thank you

## 2021-10-15 NOTE — Telephone Encounter (Signed)
Sch per 2/14 inbasket, pt aware °

## 2021-10-15 NOTE — Progress Notes (Signed)
Patient ID: Tommy Yang, male   DOB: 01-03-1951, 71 y.o.   MRN: [id number]           Reason for Appointment: Follow-up     History of Present Illness:          Diagnosis: Type 2 diabetes mellitus, date of diagnosis: 05/2013       Past history:   He apparently had significant hyperglycemia in 2014 when seen in the urgent care center but did not establish with a PCP for control. He was started on treatment for his diabetes in 10/2013 and he was initially treated with metformin and Januvia A few months later he was also given glipizide ER to help his control when his A1c had gone up to 10.9 He says he was also seen by dietitian but no record available of this. Subsequently his blood sugar control had improved with A1c down to 6.8 in 9/15 On his consultation in 3/16 he had high blood sugars and weight gain; A1c was 9.1% with taking glipizide ER, Januvia and metformin.  Recent history:    Non-insulin hypoglycemic drugs the patient is taking are: Ozempic 0.5 mg weekly, Amaryl 1 mg at dinner,  Synjardy XR 12.12/998 2 tablets daily  His A1c which was down to 6.3 is now 6.9   Current blood sugar patterns and problems identified: He has lost 23 pounds since his last visit in November and continues to have decreased appetite as before  This is despite reducing his Ozempic to 0.5  However he thinks that his chemotherapy and radiation treatments are causing significant anorexia  He had some difficulty getting his test trips for his meter and has only checked blood sugars sporadically  Although he was admitted in the hospital about a month ago with blood sugars around 150 his readings at home recently close to normal Also in the lab his blood sugars appear to be near normal recently  Because of his various intercurrent illnesses he has had weakness and fatigue and is not active  Side effects from medications have been: None  Compliance with the medical regimen: Fair  Hypoglycemia: None     Glucose monitoring:  done 1 times a day         Glucometer:  [place]  Blood Glucose readings by recall:    PRE-MEAL Fasting Lunch Dinner Bedtime Overall  Glucose range: 84-116      Mean/median:        POST-MEAL PC Breakfast PC Lunch PC Dinner  Glucose range:   106-118  Mean/median:        Previous AVERAGE 108 for 30 days, blood sugar range 75-136, most readings in the morning     Self-care: The diet that the patient has been following is: tries to limit portions and fats .   Dietician visit, most recent:?  2015 .               Weight history:    Wt Readings from Last 3 Encounters:  10/15/21 226 lb 6.4 oz (102.7 kg)  09/24/21 234 lb (106.1 kg)  09/06/21 234 lb (106.1 kg)    Glycemic control:   Lab Results  Component Value Date   HGBA1C 6.9 (H) 10/10/2021   HGBA1C 7.0 (H) 09/07/2021   HGBA1C 6.3 07/04/2021   Lab Results  Component Value Date   MICROALBUR 39.7 (H) 12/31/2020   LDLCALC 92 07/04/2021   CREATININE 0.88 10/10/2021   Lab Results  Component Value Date   HGB 7.9 (L) 10/11/2021  Hospital Outpatient Visit on 10/11/2021  Component Date Value Ref Range Status   WBC Count 10/11/2021 3.8 (L)  4.0 - 10.5 K/uL Final   RBC 10/11/2021 3.01 (L)  4.22 - 5.81 MIL/uL Final   Hemoglobin 10/11/2021 7.9 (L)  13.0 - 17.0 g/dL Final   HCT 10/11/2021 25.8 (L)  39.0 - 52.0 % Final   MCV 10/11/2021 85.7  80.0 - 100.0 fL Final   MCH 10/11/2021 26.2  26.0 - 34.0 pg Final   MCHC 10/11/2021 30.6  30.0 - 36.0 g/dL Final   RDW 10/11/2021 21.7 (H)  11.5 - 15.5 % Final   Platelet Count 10/11/2021 182  150 - 400 K/uL Final   nRBC 10/11/2021 0.0  0.0 - 0.2 % Final   Neutrophils Relative % 10/11/2021 72  % Final   Neutro Abs 10/11/2021 2.8  1.7 - 7.7 K/uL Final   Lymphocytes Relative 10/11/2021 13  % Final   Lymphs Abs 10/11/2021 0.5 (L)  0.7 - 4.0 K/uL Final   Monocytes Relative 10/11/2021 12  % Final   Monocytes Absolute 10/11/2021 0.4  0.1 - 1.0 K/uL Final    Eosinophils Relative 10/11/2021 1  % Final   Eosinophils Absolute 10/11/2021 0.0  0.0 - 0.5 K/uL Final   Basophils Relative 10/11/2021 1  % Final   Basophils Absolute 10/11/2021 0.0  0.0 - 0.1 K/uL Final   Immature Granulocytes 10/11/2021 1  % Final   Abs Immature Granulocytes 10/11/2021 0.05  0.00 - 0.07 K/uL Final   Performed at [facility], [place] [address], [place], [place] [address]  Lab on 10/10/2021  Component Date Value Ref Range Status   Sodium 10/10/2021 142  135 - 145 mEq/L Final   Potassium 10/10/2021 4.7  3.5 - 5.1 mEq/L Final   Chloride 10/10/2021 108  96 - 112 mEq/L Final   CO2 10/10/2021 25  19 - 32 mEq/L Final   Glucose, Bld 10/10/2021 102 (H)  70 - 99 mg/dL Final   BUN 10/10/2021 23  6 - 23 mg/dL Final   Creatinine, Ser 10/10/2021 0.88  0.40 - 1.50 mg/dL Final   GFR 10/10/2021 88.02  >60.00 mL/min Final   Calculated using the CKD-EPI Creatinine Equation (2021)   Calcium 10/10/2021 7.6 (L)  8.4 - 10.5 mg/dL Final   Hgb A1c MFr Bld 10/10/2021 6.9 (H)  4.6 - 6.5 % Final   Glycemic Control Guidelines for People with Diabetes:Non Diabetic:  <6%Goal of Therapy: <7%Additional Action Suggested:  >8%       Allergies as of 10/15/2021       Reactions   Shingrix [zoster Vac Recomb Adjuvanted] Shortness Of Breath, Other (See Comments)   Pulse increased to 170+ and it affected the A-Fib   Other    shingls shot caused atrial fib did not take 2nd shot        Medication List        Accurate as of October 15, 2021  8:12 AM. If you have any questions, ask your nurse or doctor.          acetaminophen 500 MG tablet Commonly known as: TYLENOL Take 500-1,000 mg by mouth every 6 (six) hours as needed (for pain).   amiodarone 200 MG tablet Commonly known as: PACERONE Take 1 tablet (200 mg total) by mouth daily.   atorvastatin 80 MG tablet Commonly known as: LIPITOR TAKE 1 TABLET BY MOUTH EVERY DAY What changed: when to take this   blood  glucose meter  kit and supplies Kit Test blood sugar daily as directed. Dx code: E11.9   CALCIUM PO Take 1,000 mg by mouth daily.   diphenoxylate-atropine 2.5-0.025 MG tablet Commonly known as: LOMOTIL TAKE 1 TABLET BY MOUTH 4 (FOUR) TIMES DAILY AS NEEDED FOR DIARRHEA OR LOOSE STOOLS.   Erleada 60 MG tablet Generic drug: apalutamide Take 240 mg by mouth daily.   furosemide 40 MG tablet Commonly known as: LASIX Take 1 tablet (40 mg total) by mouth daily.   glimepiride 1 MG tablet Commonly known as: AMARYL TAKE 1 TABLET BY MOUTH EVERY DAY WITH BREAKFAST What changed: See the new instructions.   glucose blood test strip Commonly known as: Accu-Chek Guide Use as instructed to check blood sugar twice daily.   leuprolide 11.25 MG injection Commonly known as: LUPRON Inject 11.25 mg into the muscle every 6 (six) months.   losartan 50 MG tablet Commonly known as: COZAAR Take 1 tablet (50 mg total) by mouth in the morning.   metoprolol tartrate 100 MG tablet Commonly known as: LOPRESSOR Take 1 tablet (100 mg total) by mouth 2 (two) times daily. Appointment Required For Further Refills [phone]   Ozempic (0.25 or 0.5 MG/DOSE) 2 MG/1.5ML Sopn Generic drug: Semaglutide(0.25 or 0.5MG/DOS) Inject 0.5 mg into the skin once a week. What changed:  how much to take when to take this   pantoprazole 40 MG tablet Commonly known as: PROTONIX TAKE 1 TABLET BY MOUTH EVERY DAY What changed: when to take this   RADIUM RA 223 DICHLORIDE IV Injection- once every 6 months   Synjardy XR 12.12-998 MG Tb24 Generic drug: Empagliflozin-metFORMIN HCl ER TAKE 2 TABLETS BY MOUTH EVERY DAY What changed:  how much to take how to take this when to take this additional instructions   tamsulosin 0.4 MG Caps capsule Commonly known as: FLOMAX Take 1 capsule (0.4 mg total) by mouth 2 (two) times daily.   tolterodine 2 MG tablet Commonly known as: DETROL SMARTSIG:1 Tablet(s) By Mouth 1-2  Times Daily   VITAMIN B 12 PO Take 1,000 mcg by mouth daily.   Vitamin D3 10 MCG (400 UNIT) Caps Take 400 Units by mouth daily.   warfarin 5 MG tablet Commonly known as: COUMADIN Take as directed by the anticoagulation clinic. If you are unsure how to take this medication, talk to your nurse or doctor. Original instructions: Take 1 tablet (5 mg total) by mouth daily after breakfast.   zinc gluconate 50 MG tablet Take 50 mg by mouth daily.        Allergies:  Allergies  Allergen Reactions   Shingrix [Zoster Vac Recomb Adjuvanted] Shortness Of Breath and Other (See Comments)    Pulse increased to 170+ and it affected the A-Fib   Other     shingls shot caused atrial fib did not take 2nd shot    Past Medical History:  Diagnosis Date   Arthritis    Atrial fibrillation ([place])    Complication of anesthesia    needs to sit up  at 45 degree angle or gets afib during surgery or in recovery   Diabetes mellitus without complication ([place])    type 2   DVT (deep venous thrombosis) ([place]) 2015   left leg.behind knee   Foley catheter in place last changed 2eeks ago   last 7 months   GERD (gastroesophageal reflux disease)    Hx of radiation therapy 40 tx 2015   Hypercholesterolemia    Hypertension    Prostate cancer ([place]) 12/06/13  gleason 4+3=7, 6/12 cores positive. seed implant and radiation   Tourette's syndrome    Urinary retention    Wears glasses     Past Surgical History:  Procedure Laterality Date   APPENDECTOMY  1968   BALLOON DILATION N/A 08/02/2015   Procedure: BALLOON DILATION;  Surgeon: [name], MD;  Location: [facility];  Service: Endoscopy;  Laterality: N/A;   CARDIOVERSION N/A 05/13/2018   Procedure: CARDIOVERSION;  Surgeon: Sanda Klein, MD;  Location: [facility];  Service: Cardiovascular;  Laterality: N/A;   CARDIOVERSION N/A 05/18/2018   Procedure: CARDIOVERSION;  Surgeon: Buford Dresser, MD;  Location: [facility];  Service:  Cardiovascular;  Laterality: N/A;   COLONOSCOPY WITH PROPOFOL N/A 08/02/2015   Procedure: COLONOSCOPY WITH PROPOFOL w/ APC;  Surgeon: [name], MD;  Location: [facility];  Service: Endoscopy;  Laterality: N/A;   CYSTOSCOPY N/A 10/05/2020   Procedure: CYSTOSCOPY;  Surgeon: Alexis Frock, MD;  Location: [facility];  Service: Urology;  Laterality: N/A;   ESOPHAGOGASTRODUODENOSCOPY (EGD) WITH PROPOFOL N/A 08/02/2015   Procedure: ESOPHAGOGASTRODUODENOSCOPY (EGD) WITH PROPOFOL/ possible dilation.;  Surgeon: [name], MD;  Location: [facility];  Service: Endoscopy;  Laterality: N/A;   KNEE SURGERY  2001   right knee orthoscopic   PROSTATE BIOPSY  12/06/13   Gleason 4+3=7, volume 35 gm   SPINE SURGERY  1963 and 1967   Tourette'ssyndrome spinal fluid removed    TONSILLECTOMY  as child   TRANSURETHRAL RESECTION OF PROSTATE N/A 09/30/2019   Procedure: TRANSURETHRAL RESECTION OF THE PROSTATE (TURP);  Surgeon: Alexis Frock, MD;  Location[facility];  Service: Urology;  Laterality: N/A;  1 HR   TRANSURETHRAL RESECTION OF PROSTATE N/A 10/05/2020   Procedure: TRANSURETHRAL RESECTION OF THE PROSTATE (TURP);  Surgeon: Alexis Frock, MD;  Location[facility];  Service: Urology;  Laterality: N/A;  1 HR    Family History  Problem Relation Age of Onset   Heart disease Mother        before age 61   Diabetes Mother    Hyperlipidemia Mother    Varicose Veins Mother    Hypertension Mother    Heart disease Father    Diabetes Father    Hyperlipidemia Father    Hypertension Father    Cancer Brother    COPD Brother    Diabetes Brother    Hyperlipidemia Brother    Hypertension Brother     Social History:  reports that he quit smoking about 9 years ago. His smoking use included cigarettes. He has a 11.50 pack-year smoking history. He has never used smokeless tobacco. He reports that he does not currently use alcohol. He reports that he does not  use drugs.    Review of Systems        HYPERLIPIDEMIA: he has had significant hyperlipidemia and has been treated with Lipitor 80 mg daily Previously on Vascepa for high triglycerides but he felt that it would cause atrial fibrillation an hour after taking the medicine in the evening and he stopped this  His diet is usually low in fat  Triglycerides are still normal without any specific treatment HDL usually low but slightly better Has no history of CAD       Lab Results  Component Value Date   CHOL 158 07/04/2021   CHOL 155 08/03/2020   CHOL 170 05/18/2020   Lab Results  Component Value Date   HDL 39.00 (L) 07/04/2021   HDL 36.60 (L) 08/03/2020  HDL 37 (L) 05/18/2020   Lab Results  Component Value Date   LDLCALC 92 07/04/2021   LDLCALC 91 08/03/2020   LDLCALC 107 (H) 05/18/2020   Lab Results  Component Value Date   TRIG 134.0 07/04/2021   TRIG 137.0 08/03/2020   TRIG 144 05/18/2020   Lab Results  Component Value Date   CHOLHDL 4 07/04/2021   CHOLHDL 4 08/03/2020   CHOLHDL 4.6 05/18/2020   Lab Results  Component Value Date   LDLDIRECT 93.0 12/30/2016   LDLDIRECT 103.0 02/08/2015                HYPERTENSION: He has been treated with losartan 75 mg and low-dose metoprolol, followed by PCP and cardiologist Appears to have lower blood pressure reading today compared to previous values  BP Readings from Last 3 Encounters:  10/15/21 108/68  09/24/21 118/68  09/12/21 114/77    RENAL function history as follows:  Lab Results  Component Value Date   CREATININE 0.88 10/10/2021   CREATININE 0.93 09/24/2021   CREATININE 0.88 09/10/2021    Followed by cardiologist for atrial fibrillation  Taking B12 for low normal vitamin B12 level  LABS:  Hospital Outpatient Visit on 10/11/2021  Component Date Value Ref Range Status   WBC Count 10/11/2021 3.8 (L)  4.0 - 10.5 K/uL Final   RBC 10/11/2021 3.01 (L)  4.22 - 5.81 MIL/uL Final   Hemoglobin 10/11/2021 7.9  (L)  13.0 - 17.0 g/dL Final   HCT 10/11/2021 25.8 (L)  39.0 - 52.0 % Final   MCV 10/11/2021 85.7  80.0 - 100.0 fL Final   MCH 10/11/2021 26.2  26.0 - 34.0 pg Final   MCHC 10/11/2021 30.6  30.0 - 36.0 g/dL Final   RDW 10/11/2021 21.7 (H)  11.5 - 15.5 % Final   Platelet Count 10/11/2021 182  150 - 400 K/uL Final   nRBC 10/11/2021 0.0  0.0 - 0.2 % Final   Neutrophils Relative % 10/11/2021 72  % Final   Neutro Abs 10/11/2021 2.8  1.7 - 7.7 K/uL Final   Lymphocytes Relative 10/11/2021 13  % Final   Lymphs Abs 10/11/2021 0.5 (L)  0.7 - 4.0 K/uL Final   Monocytes Relative 10/11/2021 12  % Final   Monocytes Absolute 10/11/2021 0.4  0.1 - 1.0 K/uL Final   Eosinophils Relative 10/11/2021 1  % Final   Eosinophils Absolute 10/11/2021 0.0  0.0 - 0.5 K/uL Final   Basophils Relative 10/11/2021 1  % Final   Basophils Absolute 10/11/2021 0.0  0.0 - 0.1 K/uL Final   Immature Granulocytes 10/11/2021 1  % Final   Abs Immature Granulocytes 10/11/2021 0.05  0.00 - 0.07 K/uL Final   Performed at [facility], [place] [address] [place], [place] [address]  Lab on 10/10/2021  Component Date Value Ref Range Status   Sodium 10/10/2021 142  135 - 145 mEq/L Final   Potassium 10/10/2021 4.7  3.5 - 5.1 mEq/L Final   Chloride 10/10/2021 108  96 - 112 mEq/L Final   CO2 10/10/2021 25  19 - 32 mEq/L Final   Glucose, Bld 10/10/2021 102 (H)  70 - 99 mg/dL Final   BUN 10/10/2021 23  6 - 23 mg/dL Final   Creatinine, Ser 10/10/2021 0.88  0.40 - 1.50 mg/dL Final   GFR 10/10/2021 88.02  >60.00 mL/min Final   Calculated using the CKD-EPI Creatinine Equation (2021)   Calcium 10/10/2021 7.6 (L)  8.4 - 10.5 mg/dL Final  Hgb A1c MFr Bld 10/10/2021 6.9 (H)  4.6 - 6.5 % Final   Glycemic Control Guidelines for People with Diabetes:Non Diabetic:  <6%Goal of Therapy: <7%Additional Action Suggested:  >8%     Physical Examination:  BP 108/68    Pulse (!) 103    Ht _0  (1.803 m)    Wt 226 lb 6.4 oz (102.7  kg)    SpO2 93%    BMI 31.58 kg/m     Diabetes type 2, obese, non-insulin-dependent   See history of present illness for detailed discussion of his current management, blood sugar patterns and problems identified   His A1c is still reasonably good at 6.9 although previously was improved At 6.3  Currently on Synjardy XR, Ozempic 0.5 mg weekly and low-dose Amaryl  He has lost significant amount of weight Recently blood sugars appear to be near normal However he still complains of decreased appetite from his chemotherapy Although it may be desirable to stop his Ozempic he is afraid his sugars may go up and he wants to continue  HYPERTENSION: Blood pressure is low normal and now seeing his cardiologist   PLAN:  He will stop Amaryl temporarily Continue same dose of Ozempic and Synjardy However consider empirically reducing Synjardy if he continues to have diarrhea He will continue to follow-up with his cardiologist for blood pressure    There are no Patient Instructions on file for this visit.     Elayne Snare 10/15/2021, 8:12 AM   Note: This office note was prepared with Dragon voice recognition system technology. Any transcriptional errors that result from this process are unintentional.

## 2021-10-18 ENCOUNTER — Inpatient Hospital Stay (HOSPITAL_COMMUNITY)
Admission: RE | Admit: 2021-10-18 | Discharge: 2021-10-18 | Disposition: A | Discharge status: Home / Self Care | Payer: BC Managed Care – PPO | Source: Ambulatory Visit | Attending: Radiation Oncology | Admitting: Radiation Oncology

## 2021-10-18 ENCOUNTER — Ambulatory Visit (HOSPITAL_COMMUNITY): Admission: RE | Admit: 2021-10-18 | Payer: BC Managed Care – PPO | Source: Ambulatory Visit

## 2021-10-21 ENCOUNTER — Inpatient Hospital Stay: Payer: BC Managed Care – PPO

## 2021-10-21 ENCOUNTER — Other Ambulatory Visit: Payer: Self-pay

## 2021-10-21 ENCOUNTER — Inpatient Hospital Stay: Payer: BC Managed Care – PPO | Attending: Oncology

## 2021-10-21 ENCOUNTER — Inpatient Hospital Stay (HOSPITAL_BASED_OUTPATIENT_CLINIC_OR_DEPARTMENT_OTHER): Payer: BC Managed Care – PPO | Admitting: Oncology

## 2021-10-21 VITALS — BP 122/82 | HR 112 | Temp 97.2°F | Resp 18 | Wt 229.9 lb

## 2021-10-21 DIAGNOSIS — D630 Anemia in neoplastic disease: Secondary | ICD-10-CM

## 2021-10-21 DIAGNOSIS — C7951 Secondary malignant neoplasm of bone: Secondary | ICD-10-CM | POA: Insufficient documentation

## 2021-10-21 DIAGNOSIS — C61 Malignant neoplasm of prostate: Secondary | ICD-10-CM

## 2021-10-21 LAB — CBC WITH DIFFERENTIAL (CANCER CENTER ONLY)
Abs Immature Granulocytes: 0.06 10*3/uL (ref 0.00–0.07)
Basophils Absolute: 0 10*3/uL (ref 0.0–0.1)
Basophils Relative: 1 %
Eosinophils Absolute: 0 10*3/uL (ref 0.0–0.5)
Eosinophils Relative: 0 %
HCT: 26.5 % — ABNORMAL LOW (ref 39.0–52.0)
Hemoglobin: 7.9 g/dL — ABNORMAL LOW (ref 13.0–17.0)
Immature Granulocytes: 1 %
Lymphocytes Relative: 9 %
Lymphs Abs: 0.6 10*3/uL — ABNORMAL LOW (ref 0.7–4.0)
MCH: 25.9 pg — ABNORMAL LOW (ref 26.0–34.0)
MCHC: 29.8 g/dL — ABNORMAL LOW (ref 30.0–36.0)
MCV: 86.9 fL (ref 80.0–100.0)
Monocytes Absolute: 0.6 10*3/uL (ref 0.1–1.0)
Monocytes Relative: 10 %
Neutro Abs: 5.1 10*3/uL (ref 1.7–7.7)
Neutrophils Relative %: 79 %
Platelet Count: 177 10*3/uL (ref 150–400)
RBC: 3.05 MIL/uL — ABNORMAL LOW (ref 4.22–5.81)
RDW: 22.2 % — ABNORMAL HIGH (ref 11.5–15.5)
WBC Count: 6.4 10*3/uL (ref 4.0–10.5)
nRBC: 0.3 % — ABNORMAL HIGH (ref 0.0–0.2)

## 2021-10-21 LAB — IRON AND IRON BINDING CAPACITY (CC-WL,HP ONLY)
Iron: 26 ug/dL — ABNORMAL LOW (ref 45–182)
Saturation Ratios: 12 % — ABNORMAL LOW (ref 17.9–39.5)
TIBC: 227 ug/dL — ABNORMAL LOW (ref 250–450)
UIBC: 201 ug/dL (ref 117–376)

## 2021-10-21 LAB — SAMPLE TO BLOOD BANK

## 2021-10-21 LAB — FERRITIN: Ferritin: 2423 ng/mL — ABNORMAL HIGH (ref 24–336)

## 2021-10-21 LAB — PREPARE RBC (CROSSMATCH)

## 2021-10-21 MED ORDER — SODIUM CHLORIDE 0.9% IV SOLUTION
250.0000 mL | Freq: Once | INTRAVENOUS | Status: AC
  Administered 2021-10-21: 250 mL via INTRAVENOUS

## 2021-10-21 NOTE — Patient Instructions (Signed)
Blood Transfusion, Adult, Care After This sheet gives you information about how to care for yourself after your procedure. Your doctor may also give you more specific instructions. If you have problems or questions, contact your doctor. What can I expect after the procedure? After the procedure, it is common to have: Bruising and soreness at the IV site. A headache. Follow these instructions at home: Insertion site care   Follow instructions from your doctor about how to take care of your insertion site. This is where an IV tube was put into your vein. Make sure you: Wash your hands with soap and water before and after you change your bandage (dressing). If you cannot use soap and water, use hand sanitizer. Change your bandage as told by your doctor. Check your insertion site every day for signs of infection. Check for: Redness, swelling, or pain. Bleeding from the site. Warmth. Pus or a bad smell. General instructions Take over-the-counter and prescription medicines only as told by your doctor. Rest as told by your doctor. Go back to your normal activities as told by your doctor. Keep all follow-up visits as told by your doctor. This is important. Contact a doctor if: You have itching or red, swollen areas of skin (hives). You feel worried or nervous (anxious). You feel weak after doing your normal activities. You have redness, swelling, warmth, or pain around the insertion site. You have blood coming from the insertion site, and the blood does not stop with pressure. You have pus or a bad smell coming from the insertion site. Get help right away if: You have signs of a serious reaction. This may be coming from an allergy or the body's defense system (immune system). Signs include: Trouble breathing or shortness of breath. Swelling of the face or feeling warm (flushed). Fever or chills. Head, chest, or back pain. Dark pee (urine) or blood in the pee. Widespread rash. Fast  heartbeat. Feeling dizzy or light-headed. You may receive your blood transfusion in an outpatient setting. If so, you will be told whom to contact to report any reactions. These symptoms may be an emergency. Do not wait to see if the symptoms will go away. Get medical help right away. Call your local emergency services (911 in the [place] Do not drive yourself to the hospital. Summary Bruising and soreness at the IV site are common. Check your insertion site every day for signs of infection. Rest as told by your doctor. Go back to your normal activities as told by your doctor. Get help right away if you have signs of a serious reaction. This information is not intended to replace advice given to you by your health care provider. Make sure you discuss any questions you have with your health care provider. Document Revised: 12/13/2020 Document Reviewed: 02/10/2019 Elsevier Patient Education  2022 [employer]  

## 2021-10-21 NOTE — Progress Notes (Signed)
Hematology and Oncology Follow Up Visit  Tommy Yang [id number] 1951-05-08 71 y.o. 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  His disease to the bone after initially was diagnosed in 2015 with Gleason score 7 and a PSA 5.5.   Prior Therapy:  Radiation therapy with ADT completed in 2016.  He developed a rise in his PSA in 2016 up to 2.03 in 2019.  Repeat biopsy showed Gleason score of 5+4 = 9 and 9 out of 12 cores including seminal vesicle involvement.  Pet imaging for staging purposes showed asymmetric activity within the left lobe of the prostate as well as solitary left external iliac lymph node with moderate radiotracer activity.  He resumed Lupron ADT at that time and subsequently switched to [place].    He had a good radiographic response in 2019 and underwent TURP procedure in 2021.  He continues to have persistent prostate cancer at that time.  His PSA started to rise on androgen deprivation in March 2021 to 0.18.  PSA was 1.0 in September 2021.  In April 2022 was 8.42.  In August 2022 was 70.0.  He developed metastatic disease to the bone at that time.  He was started [place] at that time.    He is status post Xofigo for a total of 4 cycles.  Treatment was withheld because of worsening anemia.   Current therapy:  Antigen deprivation therapy under the care of Tommy Yang.  Erleada 240 mg daily started in August 2022.  Xgeva on a monthly basis under the care of Tommy Yang.  Interim History: Tommy Yang for a follow-up visit.  Since the last visit, he completed 4 cycles of Xofigo and therapy was interrupted due to to worsening anemia.  He was hospitalized between January 6 and September 12, 2021 due to symptomatic atrial fibrillation.  At this time, he reports feeling fair without any major complaints.  He does report some occasional  dyspnea on exertion and palpitations related to atrial fibrillation.  He denies any hematochezia, melena or hemoptysis.  He denies any nausea vomiting or abdominal pain.  He denies any worsening bone pain at this time.     Medications: I have reviewed the patient's current medications.  Current Outpatient Medications  Medication Sig Dispense Refill   acetaminophen (TYLENOL) 500 MG tablet Take 500-1,000 mg by mouth every 6 (six) hours as needed (for pain).     amiodarone (PACERONE) 200 MG tablet Take 1 tablet (200 mg total) by mouth daily. 30 tablet 0   atorvastatin (LIPITOR) 80 MG tablet TAKE 1 TABLET BY MOUTH EVERY DAY (Patient taking differently: Take 80 mg by mouth in the morning.) 90 tablet 0   blood glucose meter kit and supplies KIT Test blood sugar daily as directed. Dx code: E11.9 1 each 0   CALCIUM PO Take 1,000 mg by mouth daily.     Cholecalciferol (VITAMIN D3) 10 MCG (400 UNIT) CAPS Take 400 Units by mouth daily.     Cyanocobalamin (VITAMIN B 12 PO) Take 1,000 mcg by mouth daily.     diphenoxylate-atropine (LOMOTIL) 2.5-0.025 MG tablet TAKE 1 TABLET BY MOUTH 4 (FOUR) TIMES DAILY AS NEEDED FOR DIARRHEA OR LOOSE STOOLS. 40 tablet 1   Empagliflozin-metFORMIN HCl ER (SYNJARDY XR) 12.12-998 MG TB24 TAKE 2 TABLETS BY MOUTH EVERY DAY (Patient taking differently: Take 1 tablet by mouth in the morning and at bedtime.) 180  tablet 3   ERLEADA 60 MG tablet Take 240 mg by mouth daily.     furosemide (LASIX) 40 MG tablet Take 1 tablet (40 mg total) by mouth daily. 30 tablet 0   glimepiride (AMARYL) 1 MG tablet TAKE 1 TABLET BY MOUTH EVERY DAY WITH BREAKFAST (Patient taking differently: Take 1 mg by mouth at bedtime.) 90 tablet 0   glucose blood (ACCU-CHEK GUIDE) test strip Use as instructed to check blood sugar twice daily. 100 each 3   leuprolide (LUPRON) 11.25 MG injection Inject 11.25 mg into the muscle every 6 (six) months.     losartan (COZAAR) 50 MG tablet Take 1 tablet (50 mg total) by  mouth in the morning.     metoprolol tartrate (LOPRESSOR) 100 MG tablet Take 1 tablet (100 mg total) by mouth 2 (two) times daily. Appointment Required For Further Refills [phone] 180 tablet 0   pantoprazole (PROTONIX) 40 MG tablet TAKE 1 TABLET BY MOUTH EVERY DAY (Patient taking differently: Take 40 mg by mouth daily before breakfast.) 90 tablet 3   RADIUM RA 223 DICHLORIDE IV Injection- once every 6 months     Semaglutide,0.25 or 0.5MG/DOS, (OZEMPIC, 0.25 OR 0.5 MG/DOSE,) 2 MG/1.5ML SOPN Inject 0.5 mg into the skin once a week. (Patient taking differently: Inject 1 mg into the skin every Friday.) 4.5 mL 2   tamsulosin (FLOMAX) 0.4 MG CAPS capsule Take 1 capsule (0.4 mg total) by mouth 2 (two) times daily.     tolterodine (DETROL) 2 MG tablet SMARTSIG:1 Tablet(s) By Mouth 1-2 Times Daily     warfarin (COUMADIN) 5 MG tablet Take 1 tablet (5 mg total) by mouth daily after breakfast. 30 tablet 0   zinc gluconate 50 MG tablet Take 50 mg by mouth daily.     No current facility-administered medications for this visit.     Allergies:  Allergies  Allergen Reactions   Shingrix [Zoster Vac Recomb Adjuvanted] Shortness Of Breath and Other (See Comments)    Pulse increased to 170+ and it affected the A-Fib   Other     shingls shot caused atrial fib did not take 2nd shot     Physical Exam: Blood pressure 122/82, pulse (!) 112, temperature (!) 97.2 F (36.2 C), resp. rate 18, weight 229 lb 14.4 oz (104.3 kg), SpO2 100 %.  ECOG: 1    General appearance: Comfortable appearing without any discomfort Head: Normocephalic without any trauma Oropharynx: Mucous membranes are moist and pink without any thrush or ulcers. Eyes: Pupils are equal and round reactive to light. Lymph nodes: No cervical, supraclavicular, inguinal or axillary lymphadenopathy.   Heart:regular rate and rhythm.  S1 and S2 without leg edema. Lung: Clear without any rhonchi or wheezes.  No dullness to percussion. Abdomin:  Soft, nontender, nondistended with good bowel sounds.  No hepatosplenomegaly. Musculoskeletal: No joint deformity or effusion.  Full range of motion noted. Neurological: No deficits noted on motor, sensory and deep tendon reflex exam. Skin: No petechial rash or dryness.  Appeared moist.     Lab Results: Lab Results  Component Value Date   WBC 3.8 (L) 10/11/2021   HGB 7.9 (L) 10/11/2021   HCT 25.8 (L) 10/11/2021   MCV 85.7 10/11/2021   PLT 182 10/11/2021     Chemistry      Component Value Date/Time   NA 142 10/10/2021 0802   NA 142 05/12/2018 0853   K 4.7 10/10/2021 0802   CL 108 10/10/2021 0802   CO2 25 10/10/2021 0802  BUN 23 10/10/2021 0802   BUN 23 05/12/2018 0853   CREATININE 0.88 10/10/2021 0802   CREATININE 1.17 05/18/2020 0809      Component Value Date/Time   CALCIUM 7.6 (L) 10/10/2021 0802   ALKPHOS 79 09/24/2021 0925   AST 19 09/24/2021 0925   ALT 19 09/24/2021 0925   BILITOT 0.5 09/24/2021 0925        Impression and Plan:   71 year old man with:  1.  Prostate cancer diagnosed in 2015.  He developed castration-resistant with disease to the bone documented in August of the 2022.    He has received 4 cycles of Xofigo therapy was interrupted due to anemia.  He is currently on Erleada as well.  The natural course of this disease was reviewed at this time treatment choices were discussed.  We will assess his current response by measuring PSA and testosterone and consider staging scan in the near future.  She has disease progression, different salvage therapy options including a PARP inhibitor, systemic chemotherapy and subsequently Pluvecto.  For the time being now we will keep him on the same dose and schedule of Erleada.  We will continue to monitor his PSA closely and institute different salvage therapy at that time.  This was discussed with him currently.  I have also recommended to discontinue Xofigo permanently because of his anemia.  2.  Anemia:  Multifactorial in nature related to malignancy and possibly bone marrow involvement of prostate cancer.  Xofigo treatment could have contributed to that as well.  His hemoglobin was 7.9 on October 11, 2021.  We will update his work-up Yang including iron level, B12 as well as erythropoietin.  Packed red cell transfusion could be used to boost his count temporarily.  His hemoglobin Yang continues to be 7.9 and he is mildly symptomatic and we will arrange for transfusion of 1 unit of packed red cells.   3.  Androgen deprivation therapy: I recommended continuing this indefinitely.  He is receiving that under the care of Tommy Yang.  4.  Bone directed therapy: He will continue Xgeva currently under the care of Tommy Yang.   5.  Follow-up: In the next 4 to 6 weeks to follow his progress.     30  minutes were spent on this encounter.  The time was dedicated to reviewing laboratory data, disease status update, treatment choices and complication related to therapy.     Zola Button, MD 2/20/20238:16 AM

## 2021-10-21 NOTE — Progress Notes (Signed)
HGB 7.9 g/dL. Verbal order from Dr. Alen Blew for 1 unit PRBC's

## 2021-10-22 LAB — BPAM RBC
Blood Product Expiration Date: 202303122359
ISSUE DATE / TIME: 202302201016
Unit Type and Rh: 600

## 2021-10-22 LAB — TYPE AND SCREEN
ABO/RH(D): A NEG
Antibody Screen: NEGATIVE
Unit division: 0

## 2021-10-24 ENCOUNTER — Other Ambulatory Visit: Payer: Self-pay | Admitting: Oncology

## 2021-10-24 ENCOUNTER — Ambulatory Visit (HOSPITAL_COMMUNITY)
Admission: RE | Admit: 2021-10-24 | Discharge: 2021-10-24 | Disposition: A | Discharge status: Home / Self Care | Payer: BC Managed Care – PPO | Source: Ambulatory Visit | Attending: Nurse Practitioner | Admitting: Nurse Practitioner

## 2021-10-24 ENCOUNTER — Other Ambulatory Visit: Payer: Self-pay

## 2021-10-24 ENCOUNTER — Encounter (HOSPITAL_COMMUNITY): Payer: Self-pay | Admitting: Nurse Practitioner

## 2021-10-24 ENCOUNTER — Other Ambulatory Visit: Payer: Self-pay | Admitting: *Deleted

## 2021-10-24 VITALS — BP 98/72 | HR 144 | Ht 71.0 in | Wt 228.6 lb

## 2021-10-24 DIAGNOSIS — Z8546 Personal history of malignant neoplasm of prostate: Secondary | ICD-10-CM | POA: Diagnosis not present

## 2021-10-24 DIAGNOSIS — Z9079 Acquired absence of other genital organ(s): Secondary | ICD-10-CM | POA: Diagnosis not present

## 2021-10-24 DIAGNOSIS — R630 Anorexia: Secondary | ICD-10-CM | POA: Diagnosis not present

## 2021-10-24 DIAGNOSIS — R0683 Snoring: Secondary | ICD-10-CM | POA: Insufficient documentation

## 2021-10-24 DIAGNOSIS — I959 Hypotension, unspecified: Secondary | ICD-10-CM | POA: Diagnosis not present

## 2021-10-24 DIAGNOSIS — I4821 Permanent atrial fibrillation: Secondary | ICD-10-CM | POA: Diagnosis not present

## 2021-10-24 DIAGNOSIS — E119 Type 2 diabetes mellitus without complications: Secondary | ICD-10-CM | POA: Diagnosis not present

## 2021-10-24 DIAGNOSIS — Z9221 Personal history of antineoplastic chemotherapy: Secondary | ICD-10-CM | POA: Diagnosis not present

## 2021-10-24 DIAGNOSIS — D6869 Other thrombophilia: Secondary | ICD-10-CM

## 2021-10-24 DIAGNOSIS — D649 Anemia, unspecified: Secondary | ICD-10-CM | POA: Diagnosis not present

## 2021-10-24 DIAGNOSIS — D630 Anemia in neoplastic disease: Secondary | ICD-10-CM

## 2021-10-24 DIAGNOSIS — Z7901 Long term (current) use of anticoagulants: Secondary | ICD-10-CM | POA: Diagnosis not present

## 2021-10-24 DIAGNOSIS — I4811 Longstanding persistent atrial fibrillation: Secondary | ICD-10-CM | POA: Diagnosis not present

## 2021-10-24 DIAGNOSIS — C61 Malignant neoplasm of prostate: Secondary | ICD-10-CM | POA: Insufficient documentation

## 2021-10-24 DIAGNOSIS — Z79899 Other long term (current) drug therapy: Secondary | ICD-10-CM | POA: Diagnosis not present

## 2021-10-24 DIAGNOSIS — I10 Essential (primary) hypertension: Secondary | ICD-10-CM | POA: Diagnosis not present

## 2021-10-24 LAB — CBC
HCT: 25.7 % — ABNORMAL LOW (ref 39.0–52.0)
Hemoglobin: 7.7 g/dL — ABNORMAL LOW (ref 13.0–17.0)
MCH: 26.8 pg (ref 26.0–34.0)
MCHC: 30 g/dL (ref 30.0–36.0)
MCV: 89.5 fL (ref 80.0–100.0)
Platelets: 158 10*3/uL (ref 150–400)
RBC: 2.87 MIL/uL — ABNORMAL LOW (ref 4.22–5.81)
RDW: 21.7 % — ABNORMAL HIGH (ref 11.5–15.5)
WBC: 5.3 10*3/uL (ref 4.0–10.5)
nRBC: 0.4 % — ABNORMAL HIGH (ref 0.0–0.2)

## 2021-10-24 MED ORDER — LOSARTAN POTASSIUM 50 MG PO TABS: 25.0000 mg | ORAL_TABLET | Freq: Every morning | ORAL | Status: DC

## 2021-10-24 NOTE — Progress Notes (Signed)
Per Dr.Shadad, received confirmation from charge nurse to schedule pt for 2 units for RBCs. Pt wife has been called and notified of appointment for 2/24 at 12pm. Pt wife verbalized understanding

## 2021-10-24 NOTE — Patient Instructions (Signed)
Decrease losartan to 25mg  once a day (1/2 of your 50mg  tablet)

## 2021-10-24 NOTE — Progress Notes (Signed)
Date:  10/24/2021   ID:  Tommy Yang, DOB 12/17/1950, MRN [id number]  Location: home   Provider location: [address] [place], [place] [address] Evaluation Performed:Follow up   PCP:  [name], MD  Primary Cardiologist:  None  Primary Electrophysiologist: none   MK:JIZXYO fibrillation   History of Present Illness: Tommy Yang is a 71 y.o. male who presents for f/u today. He has longstanding persistent afib. He feels he may have had afib for 2 years befor he was diagnosed.  On previous visits, he has been offered AAD therapy which he has deferred as he feels he is minimally symptomatic. He does have some snoring. Possible apnea, but  pt states he would not be able to deal with a mask to treat sleep apnea. He scratched  his lower leg and being diabetic, has an appointment with the wound center to make sure it is healing.  6 month f/u in afib clinic, 09/03/19. He is in rate controlled afib. He reports going to ER  In September after the flu shot with afib with RVR. He was rate controlled with IV Cardizem and discharged home on same meds. He is having issues now with urinary retention with h/o prostate CA. He is wearing a urinary catheter. He is due to go back to urologist tomorrow for removal but if he can not urinate , he was told he will have to have surgery. He is having intermittent hematuria now. His prior leg wound did heal. Continues on xarelto with a CHA2DS2VASc score of 5.he is on tumeric and fish oil which may be contributing to hematuria.  In afib clinic, 10/05/19. He had post chanel TURP on 09/30/19 and had to go back to ER yesterday  to have foley reinserted after not being able to void since it was removed earlier that am. Urine had clots in the ER but was irrigated to pink color and remains that way today. He is back on xarelto. Pt asked to be seen today after increase in HR into the 120-130's and has noted swelling in his lower extremities. He does not describe   any PND/orthopnea. He feels nervous with the increase of HR and short of breath with exertion.[name] overall weight is down one lb today but his LE's are swollen.  F/u in afib clinic, 10/10/19. He feels better with lasix and addition of Cardizem to slow HR. He took lasix x 5 days and now his weight is back down to his normal. He will stay on Cardizem. Bmet drawn today.He continues with foley cath with pinkish urine and sees urologist tomorrow.   F/u 11/08/19. His catheter has been removed and he is doing much better with voiding. No recent hematuria. His heart rate is now controlled afib back in the  80's and he stopped taking the diltiazem daily and went back on amlodipine. BP well controlled.   F/u in afib clinic, 09/14/20. He continues in permanent afib, rate controlled at 68 bpm. . He has developed some dysuria and has the foley cath with leg bag back in.  No hematuria. He did have some infection and finishes antibiotic tomorrow. Otherwise no complaints.   F/u in afib clinic, 09/24/21. He was in the hospital early January for afib with RVR. The RVR was  thought to be secondary to his cancer progression and change in chemotherapy with side effects of weight loss and anemia. He was started on amiodarone for rate control. As of today,  he has lowered his amio to 200 mg daily. Unfortunately, his HR is 123 bpm today, but he has not taken his am meds. He has increased his lasix to 80 mg for the last few days. He has no way of monitoring at home to check his heart rate. We discussed a zio patch for one week to check rate control and he is in agreement. He continues with foley cath in place.    F/u afib clinic, 10/24/21. He wore a  monitor  early February  that showed average HR of 100 bpm, with nonsustained spikes to 200, slowest 62 bpm. He had a HGB of 7.9 on Monday and was transfused. He is still very weak, short of breath and lightheaded/dizzy with a systolic BP of 98 today. His HR on presentation was 144 bpm, on  pulse ox  with rest 90-120's. I will check a cbc again today to see if improvement form recent transfusion. He is on max beta blocker, amiodarone 200 mg daily for rate control and with soft BB no room to add/titrate additional meds.We discussed that the anemia is playing a big part driving rvr with his afib, as his rate control has never been an issue until his cancer status has changed. His appetite has improved but still with anorexia.With his low BP, will cut losartan in half. Echo in January showed normal EF.    Today, he denies symptoms of palpitations, chest pain, shortness of breath, orthopnea, PND, lower extremity edema, claudication, dizziness, presyncope, syncope, bleeding, or neurologic sequela. The patient is tolerating medications without difficulties and is otherwise without complaint today.   he denies symptoms of cough, fevers, chills, or new SOB worrisome for COVID 19.    Filed Weights   10/24/21 0820  Weight: 103.7 kg     Past Medical History:  Diagnosis Date   Arthritis    Atrial fibrillation ([place])    Complication of anesthesia    needs to sit up  at 45 degree angle or gets afib during surgery or in recovery   Diabetes mellitus without complication ([place])    type 2   DVT (deep venous thrombosis) ([place]) 2015   left leg.behind knee   Foley catheter in place last changed 2eeks ago   last 7 months   GERD (gastroesophageal reflux disease)    Hx of radiation therapy 40 tx 2015   Hypercholesterolemia    Hypertension    Prostate cancer ([place]) 12/06/13   gleason 4+3=7, 6/12 cores positive. seed implant and radiation   Tourette's syndrome    Urinary retention    Wears glasses    Past Surgical History:  Procedure Laterality Date   APPENDECTOMY  1968   BALLOON DILATION N/A 08/02/2015   Procedure: BALLOON DILATION;  Surgeon: [name], MD;  Location: [facility];  Service: Endoscopy;  Laterality: N/A;   CARDIOVERSION N/A 05/13/2018   Procedure: CARDIOVERSION;  Surgeon:  Sanda Klein, MD;  Location: [facility];  Service: Cardiovascular;  Laterality: N/A;   CARDIOVERSION N/A 05/18/2018   Procedure: CARDIOVERSION;  Surgeon: Buford Dresser, MD;  Location: [facility];  Service: Cardiovascular;  Laterality: N/A;   COLONOSCOPY WITH PROPOFOL N/A 08/02/2015   Procedure: COLONOSCOPY WITH PROPOFOL w/ APC;  Surgeon: [name], MD;  Location: [facility];  Service: Endoscopy;  Laterality: N/A;   CYSTOSCOPY N/A 10/05/2020   Procedure: CYSTOSCOPY;  Surgeon: Alexis Frock, MD;  Location: [facility];  Service: Urology;  Laterality: N/A;   ESOPHAGOGASTRODUODENOSCOPY (EGD) WITH PROPOFOL N/A  08/02/2015   Procedure: ESOPHAGOGASTRODUODENOSCOPY (EGD) WITH PROPOFOL/ possible dilation.;  Surgeon: [name], MD;  Location: [facility];  Service: Endoscopy;  Laterality: N/A;   KNEE SURGERY  2001   right knee orthoscopic   PROSTATE BIOPSY  12/06/13   Gleason 4+3=7, volume 35 gm   SPINE SURGERY  1963 and 1967   Tourette'ssyndrome spinal fluid removed    TONSILLECTOMY  as child   TRANSURETHRAL RESECTION OF PROSTATE N/A 09/30/2019   Procedure: TRANSURETHRAL RESECTION OF THE PROSTATE (TURP);  Surgeon: Alexis Frock, MD;  Location[facility];  Service: Urology;  Laterality: N/A;  1 HR   TRANSURETHRAL RESECTION OF PROSTATE N/A 10/05/2020   Procedure: TRANSURETHRAL RESECTION OF THE PROSTATE (TURP);  Surgeon: Alexis Frock, MD;  Location: [facility];  Service: Urology;  Laterality: N/A;  1 HR     Current Outpatient Medications  Medication Sig Dispense Refill   acetaminophen (TYLENOL) 500 MG tablet Take 500-1,000 mg by mouth every 6 (six) hours as needed (for pain).     amiodarone (PACERONE) 200 MG tablet Take 1 tablet (200 mg total) by mouth daily. 30 tablet 0   atorvastatin (LIPITOR) 80 MG tablet TAKE 1 TABLET BY MOUTH EVERY DAY 90 tablet 0   blood glucose meter kit and supplies KIT Test blood sugar daily as  directed. Dx code: E11.9 1 each 0   CALCIUM PO Take 1,000 mg by mouth daily.     Cholecalciferol (VITAMIN D3) 10 MCG (400 UNIT) CAPS Take 400 Units by mouth daily.     Cyanocobalamin (VITAMIN B 12 PO) Take 1,000 mcg by mouth daily.     diphenoxylate-atropine (LOMOTIL) 2.5-0.025 MG tablet TAKE 1 TABLET BY MOUTH 4 (FOUR) TIMES DAILY AS NEEDED FOR DIARRHEA OR LOOSE STOOLS. 40 tablet 1   Empagliflozin-metFORMIN HCl ER (SYNJARDY XR) 12.12-998 MG TB24 TAKE 2 TABLETS BY MOUTH EVERY DAY 180 tablet 3   ERLEADA 60 MG tablet Take 240 mg by mouth daily.     furosemide (LASIX) 40 MG tablet Take 1 tablet (40 mg total) by mouth daily. 30 tablet 0   glimepiride (AMARYL) 1 MG tablet TAKE 1 TABLET BY MOUTH EVERY DAY WITH BREAKFAST 90 tablet 0   glucose blood (ACCU-CHEK GUIDE) test strip Use as instructed to check blood sugar twice daily. 100 each 3   leuprolide (LUPRON) 11.25 MG injection Inject 11.25 mg into the muscle every 6 (six) months.     metoprolol tartrate (LOPRESSOR) 100 MG tablet Take 1 tablet (100 mg total) by mouth 2 (two) times daily. Appointment Required For Further Refills [phone] 180 tablet 0   pantoprazole (PROTONIX) 40 MG tablet TAKE 1 TABLET BY MOUTH EVERY DAY 90 tablet 3   Semaglutide,0.25 or 0.5MG/DOS, (OZEMPIC, 0.25 OR 0.5 MG/DOSE,) 2 MG/1.5ML SOPN Inject 0.5 mg into the skin once a week. (Patient taking differently: Inject 1 mg into the skin every Friday.) 4.5 mL 2   tamsulosin (FLOMAX) 0.4 MG CAPS capsule Take 1 capsule (0.4 mg total) by mouth 2 (two) times daily.     tolterodine (DETROL) 2 MG tablet SMARTSIG:1 Tablet(s) By Mouth 1-2 Times Daily     zinc gluconate 50 MG tablet Take 50 mg by mouth daily.     losartan (COZAAR) 50 MG tablet Take 0.5 tablets (25 mg total) by mouth in the morning.     warfarin (COUMADIN) 5 MG tablet Take 1 tablet (5 mg total) by mouth daily after breakfast. 30 tablet 0   No current facility-administered  medications for this encounter.    Allergies:    Shingrix [zoster vac recomb adjuvanted] and Other   Social History:  The patient  reports that he quit smoking about 9 years ago. His smoking use included cigarettes. He has a 11.50 pack-year smoking history. He has never used smokeless tobacco. He reports that he does not currently use alcohol. He reports that he does not use drugs.   Family History:  The patient's  family history includes COPD in his brother; Cancer in his brother; Diabetes in his brother, father, and mother; Heart disease in his father and mother; Hyperlipidemia in his brother, father, and mother; Hypertension in his brother, father, and mother; Varicose Veins in his mother.    ROS:  Please see the history of present illness.   All other systems are personally reviewed and negative.   Exam:General:   pal  Eyes: EOMI, anicteric ENT: Oral Mucosa clear and moist Cardiovascular:rapid irregular rhythm, no murmurs, rubs or gallops, no edema, Respiratory: Normal respiratory effort on room air, lungs clear to auscultation bilaterally Abdomen: soft, non-distended, non-tender, normal bowel sounds. Wearing a urinary catheter with blood tinged urine. Skin: No Rash Neurologic: Grossly no focal neuro deficit.Mental status AAOx3, speech normal, Psychiatric:Appropriate affect, and mood   Recent Labs: 09/06/2021: B Natriuretic Peptide 219.2 09/07/2021: Magnesium 1.7 09/09/2021: TSH 1.574 09/24/2021: ALT 19 10/10/2021: BUN 23; Creatinine, Ser 0.88; Potassium 4.7; Sodium 142 10/21/2021: Hemoglobin 7.9; Platelet Count 177  personally reviewed    Other studies personally reviewed: Epic records reviewed EKG- afib at 144 bpm on prsentation, with rest 90's to 120's. Echo- 1. Left ventricular ejection fraction, by estimation, is 60 to 65%. The  left ventricle has normal function. The left ventricle has no regional  wall motion abnormalities. There is mild left ventricular hypertrophy.  Left ventricular diastolic parameters  are indeterminate.    2. Right ventricular systolic function is normal. The right ventricular  size is normal. There is normal pulmonary artery systolic pressure. The  estimated right ventricular systolic pressure is 86.3 mmHg.   3. Right atrial size was mildly dilated.   4. The mitral valve is normal in structure. Trivial mitral valve  regurgitation.   5. The aortic valve is tricuspid. Aortic valve regurgitation is not  visualized. Aortic valve sclerosis is present, with no evidence of aortic  valve stenosis.   6. The inferior vena cava is dilated in size with >50% respiratory  variability, suggesting right atrial pressure of 8 mmHg.   FINDINGS   Left Ventricle: Left ventricular ejection fraction, by estimation, is 60  to 65%. The left ventricle has normal function. The left ventricle has no  regional wall motion abnormalities. The left ventricular internal cavity  size was normal in size. There is   mild left ventricular hypertrophy. Left ventricular diastolic parameters  are indeterminate.     ASSESSMENT AND PLAN:  1. Afib with RVR For years his afib was very well rate controlled I feel the change in his cancer status and anemia is driving his RVR with his afib Cbc today to f/u transfusion from  Monday as he is still  very weak, winded, pale and dizzy  He is on max therapy with metoprolol and amiodarone for rate control  He also has a soft BP that would limit addition of other rate control  Is being compliant with amiodarone 200 mg daily Echo  in hospital showed normal EF Has short acting cardizem 30 mg if needed Continue metoprolol tartrate 100 mg bid  Continue  warfarin ( pt changed to warfarin to xarelto for chemo interactions)   This patients CHA2DS2-VASc Score and unadjusted Ischemic Stroke Rate (% per year) is equal to 7.2 % stroke rate/year from a score of 5 Continue warfarin    2.HTN Off amlodipine for soft BP's Cut losartan in half   3. Prostate CA Per oncology/urology    Follow-up: in one month   Current medicines are reviewed at length with the patient today.     Labs/ tests ordered today include:none Orders Placed This Encounter  Procedures   CBC   EKG 12-Lead    Signed, Roderic Palau, NP 10/24/2021 8:53 AM  [facility] [address] [place], [place] [address] [phone]

## 2021-10-25 ENCOUNTER — Other Ambulatory Visit: Payer: Self-pay | Admitting: *Deleted

## 2021-10-25 ENCOUNTER — Inpatient Hospital Stay: Payer: BC Managed Care – PPO

## 2021-10-25 DIAGNOSIS — C61 Malignant neoplasm of prostate: Secondary | ICD-10-CM

## 2021-10-25 DIAGNOSIS — D630 Anemia in neoplastic disease: Secondary | ICD-10-CM

## 2021-10-25 DIAGNOSIS — C7951 Secondary malignant neoplasm of bone: Secondary | ICD-10-CM | POA: Diagnosis not present

## 2021-10-25 LAB — CBC WITH DIFFERENTIAL (CANCER CENTER ONLY)
Abs Immature Granulocytes: 0.06 10*3/uL (ref 0.00–0.07)
Basophils Absolute: 0 10*3/uL (ref 0.0–0.1)
Basophils Relative: 0 %
Eosinophils Absolute: 0 10*3/uL (ref 0.0–0.5)
Eosinophils Relative: 0 %
HCT: 26.5 % — ABNORMAL LOW (ref 39.0–52.0)
Hemoglobin: 8.2 g/dL — ABNORMAL LOW (ref 13.0–17.0)
Immature Granulocytes: 1 %
Lymphocytes Relative: 8 %
Lymphs Abs: 0.5 10*3/uL — ABNORMAL LOW (ref 0.7–4.0)
MCH: 26.7 pg (ref 26.0–34.0)
MCHC: 30.9 g/dL (ref 30.0–36.0)
MCV: 86.3 fL (ref 80.0–100.0)
Monocytes Absolute: 0.6 10*3/uL (ref 0.1–1.0)
Monocytes Relative: 9 %
Neutro Abs: 5.1 10*3/uL (ref 1.7–7.7)
Neutrophils Relative %: 82 %
Platelet Count: 175 10*3/uL (ref 150–400)
RBC: 3.07 MIL/uL — ABNORMAL LOW (ref 4.22–5.81)
RDW: 21.9 % — ABNORMAL HIGH (ref 11.5–15.5)
WBC Count: 6.3 10*3/uL (ref 4.0–10.5)
nRBC: 0.5 % — ABNORMAL HIGH (ref 0.0–0.2)

## 2021-10-25 LAB — SAMPLE TO BLOOD BANK

## 2021-10-25 LAB — PREPARE RBC (CROSSMATCH)

## 2021-10-25 MED ORDER — SODIUM CHLORIDE 0.9% FLUSH: 10.0000 mL | INTRAVENOUS | Status: DC | PRN

## 2021-10-25 NOTE — Progress Notes (Signed)
Per Alen Blew MD, ok to run blood at 331ml/hr.

## 2021-10-26 LAB — PROSTATE-SPECIFIC AG, SERUM (LABCORP): Prostate Specific Ag, Serum: 48.2 ng/mL — ABNORMAL HIGH (ref 0.0–4.0)

## 2021-10-28 ENCOUNTER — Other Ambulatory Visit: Payer: Self-pay

## 2021-10-28 ENCOUNTER — Ambulatory Visit (INDEPENDENT_AMBULATORY_CARE_PROVIDER_SITE_OTHER): Payer: BC Managed Care – PPO

## 2021-10-28 DIAGNOSIS — Z7901 Long term (current) use of anticoagulants: Secondary | ICD-10-CM | POA: Diagnosis not present

## 2021-10-28 DIAGNOSIS — I4819 Other persistent atrial fibrillation: Secondary | ICD-10-CM | POA: Diagnosis not present

## 2021-10-28 LAB — BPAM RBC
Blood Product Expiration Date: 202303202359
Blood Product Expiration Date: 202303202359
ISSUE DATE / TIME: 202302241306
ISSUE DATE / TIME: 202302241306
Unit Type and Rh: 600
Unit Type and Rh: 600

## 2021-10-28 LAB — TYPE AND SCREEN
ABO/RH(D): A NEG
Antibody Screen: NEGATIVE
Unit division: 0
Unit division: 0

## 2021-10-28 LAB — POCT INR: INR: 3.9 — AB (ref 2.0–3.0)

## 2021-10-28 NOTE — Patient Instructions (Signed)
HOLD TONIGHT ONLY and then Continue 1 tablet daily except for 1.5 tablets each Monday and Friday.  Repeat INR in 2 weeks

## 2021-10-29 ENCOUNTER — Telehealth: Payer: Self-pay | Admitting: Oncology

## 2021-10-29 NOTE — Telephone Encounter (Signed)
Scheduled per los, patient has been called and notified of upcoming appointments. 

## 2021-10-31 DIAGNOSIS — C61 Malignant neoplasm of prostate: Secondary | ICD-10-CM | POA: Diagnosis not present

## 2021-11-04 ENCOUNTER — Ambulatory Visit: Payer: BC Managed Care – PPO

## 2021-11-04 NOTE — Progress Notes (Signed)
Nutrition Assessment ? ? ?Reason for Assessment:  ? ?Patient identified on Malnutrition Screening report for weight loss ? ? ?ASSESSMENT:  ?71 year old male with advanced prostate cancer.  Past medical history of DM, afib.  Patient on erleada, ADT, xgeva ? ?Called and spoke with patient via phone.  Patient reports that his appetite is decreased.  Having taste alterations especially with breads, pastas.  "It taste like cardboard."  Says that he mostly eats [employer] soup, yogurt, boost, fruit, SF jello.  Concerned about loss of muscle.   ? ? ?Medications: glimepiride, coumadin, protonix, Vit D, lasix, lomotil ? ? ?Labs: reviewed ? ? ?Anthropometrics:  ? ?Height: 71 inches ?Weight: 228 lb 9.6 oz on 2/23 ?234 lb on 1/24 ?259 lb on 11/9 ? ?BMI: 31 ? ?12% weight loss in the last 2 1/2 months ? ? ? ?NUTRITION DIAGNOSIS: Inadequate oral intake related to cancer related treatment side effects as evidenced by 12% weight loss in the last 2 1/2 months and decrease intake ? ? ?INTERVENTION:  ?Reviewed strategies to help with taste change.  Will mail handout ?Encouraged patient to continue boost shakes. Will mail coupons ?Reviewed foods high in protein and ways to add protein in diet.  ?Contact information mailed. Encouraged patient to contact RD with further questions or concerns ? ? ?Next Visit: no follow-up ?Patient to call if needed ? ?Riyah Bardon B. Zenia Resides, RD, LDN ?Registered Dietitian ?[phone] (mobile) ? ? ? ? ? ?

## 2021-11-07 DIAGNOSIS — C7951 Secondary malignant neoplasm of bone: Secondary | ICD-10-CM | POA: Diagnosis not present

## 2021-11-07 DIAGNOSIS — C778 Secondary and unspecified malignant neoplasm of lymph nodes of multiple regions: Secondary | ICD-10-CM | POA: Diagnosis not present

## 2021-11-07 DIAGNOSIS — R338 Other retention of urine: Secondary | ICD-10-CM | POA: Diagnosis not present

## 2021-11-07 DIAGNOSIS — C61 Malignant neoplasm of prostate: Secondary | ICD-10-CM | POA: Diagnosis not present

## 2021-11-11 ENCOUNTER — Ambulatory Visit (HOSPITAL_BASED_OUTPATIENT_CLINIC_OR_DEPARTMENT_OTHER): Payer: BC Managed Care – PPO | Admitting: Family

## 2021-11-11 ENCOUNTER — Other Ambulatory Visit: Payer: Self-pay

## 2021-11-11 ENCOUNTER — Ambulatory Visit (INDEPENDENT_AMBULATORY_CARE_PROVIDER_SITE_OTHER): Payer: BC Managed Care – PPO

## 2021-11-11 DIAGNOSIS — Z7901 Long term (current) use of anticoagulants: Secondary | ICD-10-CM | POA: Diagnosis not present

## 2021-11-11 DIAGNOSIS — I4819 Other persistent atrial fibrillation: Secondary | ICD-10-CM

## 2021-11-11 LAB — POCT INR: INR: 6.8 — AB (ref 2.0–3.0)

## 2021-11-11 NOTE — Patient Instructions (Signed)
HOLD TONIGHT, Tuesday AND WEDNESDAY ONLY and then DECREASE to 1 tablet daily.  Repeat INR in 1 week. ?

## 2021-11-13 ENCOUNTER — Other Ambulatory Visit: Payer: Self-pay | Admitting: Endocrinology

## 2021-11-13 ENCOUNTER — Other Ambulatory Visit: Payer: Self-pay | Admitting: Internal Medicine

## 2021-11-17 ENCOUNTER — Other Ambulatory Visit: Payer: Self-pay | Admitting: Endocrinology

## 2021-11-17 ENCOUNTER — Other Ambulatory Visit: Payer: Self-pay | Admitting: Internal Medicine

## 2021-11-17 ENCOUNTER — Other Ambulatory Visit (HOSPITAL_COMMUNITY): Payer: Self-pay | Admitting: Nurse Practitioner

## 2021-11-17 DIAGNOSIS — I10 Essential (primary) hypertension: Secondary | ICD-10-CM

## 2021-11-18 NOTE — Telephone Encounter (Signed)
Please refill as per office routine med refill policy (all routine meds to be refilled for 3 mo or monthly (per pt preference) up to one year from last visit, then month to month grace period for 3 mo, then further med refills will have to be denied) ? ?

## 2021-11-21 ENCOUNTER — Ambulatory Visit (INDEPENDENT_AMBULATORY_CARE_PROVIDER_SITE_OTHER): Payer: BC Managed Care – PPO

## 2021-11-21 ENCOUNTER — Ambulatory Visit (HOSPITAL_COMMUNITY)
Admission: RE | Admit: 2021-11-21 | Discharge: 2021-11-21 | Disposition: A | Discharge status: Home / Self Care | Payer: BC Managed Care – PPO | Source: Ambulatory Visit | Attending: Nurse Practitioner | Admitting: Nurse Practitioner

## 2021-11-21 ENCOUNTER — Other Ambulatory Visit: Payer: Self-pay

## 2021-11-21 VITALS — BP 92/50 | HR 134 | Ht 71.0 in

## 2021-11-21 DIAGNOSIS — C61 Malignant neoplasm of prostate: Secondary | ICD-10-CM | POA: Diagnosis not present

## 2021-11-21 DIAGNOSIS — I4819 Other persistent atrial fibrillation: Secondary | ICD-10-CM | POA: Diagnosis not present

## 2021-11-21 DIAGNOSIS — D6869 Other thrombophilia: Secondary | ICD-10-CM | POA: Diagnosis not present

## 2021-11-21 DIAGNOSIS — Z7901 Long term (current) use of anticoagulants: Secondary | ICD-10-CM

## 2021-11-21 DIAGNOSIS — D649 Anemia, unspecified: Secondary | ICD-10-CM | POA: Diagnosis not present

## 2021-11-21 DIAGNOSIS — E119 Type 2 diabetes mellitus without complications: Secondary | ICD-10-CM | POA: Insufficient documentation

## 2021-11-21 DIAGNOSIS — Z8249 Family history of ischemic heart disease and other diseases of the circulatory system: Secondary | ICD-10-CM | POA: Diagnosis not present

## 2021-11-21 DIAGNOSIS — Z833 Family history of diabetes mellitus: Secondary | ICD-10-CM | POA: Diagnosis not present

## 2021-11-21 DIAGNOSIS — Z7985 Long-term (current) use of injectable non-insulin antidiabetic drugs: Secondary | ICD-10-CM | POA: Insufficient documentation

## 2021-11-21 DIAGNOSIS — I10 Essential (primary) hypertension: Secondary | ICD-10-CM | POA: Insufficient documentation

## 2021-11-21 LAB — POCT INR: INR: 4.9 — AB (ref 2.0–3.0)

## 2021-11-21 MED ORDER — AMIODARONE HCL 200 MG PO TABS: 200.0000 mg | ORAL_TABLET | Freq: Every day | ORAL | 3 refills | Status: DC

## 2021-11-21 NOTE — Progress Notes (Signed)
?  ? ? ?Date:  11/21/2021  ? ?ID:  Tommy Yang, DOB Jan 25, 1951, MRN [id number]  Location: home   ?Provider location: [address] [place], [place] [address] ?Evaluation Performed:Follow up  ? ?PCP:  [name], MD  ?Primary Cardiologist:  None  ?Primary Electrophysiologist: none  ? ?[place]:MIWO[place] fibrillation ?  ?History of Present Illness: ?Tommy Yang is a 71 y.o. male who presents for f/u today. He has longstanding persistent afib. He feels he may have had afib for 2 years befor he was diagnosed.  On previous visits, he has been offered AAD therapy which he has deferred as he feels he is minimally symptomatic. He does have some snoring. Possible apnea, but  pt states he would not be able to deal with a mask to treat sleep apnea. He scratched  his lower leg and being diabetic, has an appointment with the wound center to make sure it is healing. ? ?6 month f/u in afib clinic, 09/03/19. He is in rate controlled afib. He reports going to ER  In September after the flu shot with afib with RVR. He was rate controlled with IV Cardizem and discharged home on same meds. He is having issues now with urinary retention with h/o prostate CA. He is wearing a urinary catheter. He is due to go back to urologist tomorrow for removal but if he can not urinate , he was told he will have to have surgery. He is having intermittent hematuria now. His prior leg wound did heal. Continues on xarelto with a CHA2DS2VASc score of 5.he is on tumeric and fish oil which may be contributing to hematuria. ? ?In afib clinic, 10/05/19. He had post chanel TURP on 09/30/19 and had to go back to ER yesterday  to have foley reinserted after not being able to void since it was removed earlier that am. Urine had clots in the ER but was irrigated to pink color and remains that way today. He is back on xarelto. Pt asked to be seen today after increase in HR into the 120-130's and has noted swelling in his lower extremities. He does not describe   any PND/orthopnea. He feels nervous with the increase of HR and short of breath with exertion.[name] overall weight is down one lb today but his LE's are swollen. ? ?F/u in afib clinic, 10/10/19. He feels better with lasix and addition of Cardizem to slow HR. He took lasix x 5 days and now his weight is back down to his normal. He will stay on Cardizem. Bmet drawn today.He continues with foley cath with pinkish urine and sees urologist tomorrow.  ? ?F/u 11/08/19. His catheter has been removed and he is doing much better with voiding. No recent hematuria. His heart rate is now controlled afib back in the  80's and he stopped taking the diltiazem daily and went back on amlodipine. BP well controlled.  ? ?F/u in afib clinic, 09/14/20. He continues in permanent afib, rate controlled at 68 bpm. . He has developed some dysuria and has the foley cath with leg bag back in.  No hematuria. He did have some infection and finishes antibiotic tomorrow. Otherwise no complaints.  ? ?F/u in afib clinic, 09/24/21. He was in the hospital early January for afib with RVR. The RVR was  thought to be secondary to his cancer progression and change in chemotherapy with side effects of weight loss and anemia. He was started on amiodarone for rate control. As of today,  he has lowered his amio to 200 mg daily. Unfortunately, his HR is 123 bpm today, but he has not taken his am meds. He has increased his lasix to 80 mg for the last few days. He has no way of monitoring at home to check his heart rate. We discussed a zio patch for one week to check rate control and he is in agreement. He continues with foley cath in place.   ? ?F/u afib clinic, 10/24/21. He wore a  monitor  early February  that showed average HR of 100 bpm, with nonsustained spikes to 200, slowest 62 bpm. He had a HGB of 7.9 on Monday and was transfused. He is still very weak, short of breath and lightheaded/dizzy with a systolic BP of 98 today. His HR on presentation was 144 bpm, on  pulse ox  with rest 90-120's. I will check a cbc again today to see if improvement form recent transfusion. He is on max beta blocker, amiodarone 200 mg daily for rate control and with soft BB no room to add/titrate additional meds.We discussed that the anemia is playing a big part driving rvr with his afib, as his rate control has never been an issue until his cancer status has changed. His appetite has improved but still with anorexia.With his low BP, will cut losartan in half. Echo in January showed normal EF.  ? ?f/u in afib clinic 11/21/21. He was transfused right after  last visit here with 3 units blood but despite this is still is very weak, coming in in a w/c today. He has afib with RVR probably multifactorial from  weakened state from  progression of his prostate CA, anemia and poor oral intake. Low BP prevents increase of rate control. His BP today is 92/50. He states he fell 2x last week. Will stop losartan. EMS came to house but did not transport pt. He has lab work next  Monday to see state of his anemia. He has RVR today at 134 bpm, states he has been off his amiodarone x 3 days for some issues with [facility] refilling it. He had only had 2 yogurts before coming here today. He  has lost considerable weight since last fall.  ? ? ?Today, he denies symptoms of palpitations, chest pain. He has  shortness of breath, lower extremity edema,  dizziness, presyncope.   ? ? ?There were no vitals filed for this visit. ? ? ? ?Past Medical History:  ?Diagnosis Date  ? Arthritis   ? Atrial fibrillation ([place])   ? Complication of anesthesia   ? needs to sit up  at 45 degree angle or gets afib during surgery or in recovery  ? Diabetes mellitus without complication ([place])   ? type 2  ? DVT (deep venous thrombosis) ([place]) 2015  ? left leg.behind knee  ? Foley catheter in place last changed 2eeks ago  ? last 7 months  ? GERD (gastroesophageal reflux disease)   ? Hx of radiation therapy 40 tx 2015  ? Hypercholesterolemia   ?  Hypertension   ? Prostate cancer ([place]) 12/06/13  ? gleason 4+3=7, 6/12 cores positive. seed implant and radiation  ? Tourette's syndrome   ? Urinary retention   ? Wears glasses   ? ?Past Surgical History:  ?Procedure Laterality Date  ? APPENDECTOMY  1968  ? BALLOON DILATION N/A 08/02/2015  ? Procedure: BALLOON DILATION;  Surgeon: [name], MD;  Location: [facility];  Service: Endoscopy;  Laterality: N/A;  ? CARDIOVERSION  N/A 05/13/2018  ? Procedure: CARDIOVERSION;  Surgeon: Sanda Klein, MD;  Location: [place];  Service: Cardiovascular;  Laterality: N/A;  ? CARDIOVERSION N/A 05/18/2018  ? Procedure: CARDIOVERSION;  Surgeon: Buford Dresser, MD;  Location: [facility];  Service: Cardiovascular;  Laterality: N/A;  ? COLONOSCOPY WITH PROPOFOL N/A 08/02/2015  ? Procedure: COLONOSCOPY WITH PROPOFOL w/ APC;  Surgeon: [name], MD;  Location: [facility];  Service: Endoscopy;  Laterality: N/A;  ? CYSTOSCOPY N/A 10/05/2020  ? Procedure: CYSTOSCOPY;  Surgeon: Alexis Frock, MD;  Location: [facility];  Service: Urology;  Laterality: N/A;  ? ESOPHAGOGASTRODUODENOSCOPY (EGD) WITH PROPOFOL N/A 08/02/2015  ? Procedure: ESOPHAGOGASTRODUODENOSCOPY (EGD) WITH PROPOFOL/ possible dilation.;  Surgeon: [name], MD;  Location: [facility];  Service: Endoscopy;  Laterality: N/A;  ? KNEE SURGERY  2001  ? right knee orthoscopic  ? PROSTATE BIOPSY  12/06/13  ? Gleason 4+3=7, volume 35 gm  ? [employer]  ? Tourette'ssyndrome spinal fluid removed   ? TONSILLECTOMY  as child  ? TRANSURETHRAL RESECTION OF PROSTATE N/A 09/30/2019  ? Procedure: TRANSURETHRAL RESECTION OF THE PROSTATE (TURP);  Surgeon: Alexis Frock, MD;  Location[facility];  Service: Urology;  Laterality: N/A;  1 HR  ? TRANSURETHRAL RESECTION OF PROSTATE N/A 10/05/2020  ? Procedure: TRANSURETHRAL RESECTION OF THE PROSTATE (TURP);  Surgeon: Alexis Frock, MD;  Location: [facility];   Service: Urology;  Laterality: N/A;  1 HR  ? ? ? ?Current Outpatient Medications  ?Medication Sig Dispense Refill  ? acetaminophen (TYLENOL) 500 MG tablet Take 500-1,000 mg by mouth every 6 (six) hours as n

## 2021-11-21 NOTE — Patient Instructions (Signed)
Stop losartan

## 2021-11-21 NOTE — Patient Instructions (Signed)
HOLD Friday AND SATURDAY ONLY and then DECREASE to 1 tablet daily, except 0.5 tablet on Wednesday. Repeat INR in 2 weeks. ?

## 2021-11-25 ENCOUNTER — Encounter (HOSPITAL_COMMUNITY): Payer: Self-pay | Admitting: Oncology

## 2021-11-25 ENCOUNTER — Telehealth: Payer: Self-pay

## 2021-11-25 ENCOUNTER — Inpatient Hospital Stay (HOSPITAL_COMMUNITY)
Admission: EM | Admit: 2021-11-25 | Discharge: 2021-11-28 | DRG: 309 | Disposition: A | Discharge status: Home Health | Payer: BC Managed Care – PPO | Attending: Student | Admitting: Student

## 2021-11-25 ENCOUNTER — Emergency Department (HOSPITAL_COMMUNITY): Payer: BC Managed Care – PPO

## 2021-11-25 ENCOUNTER — Other Ambulatory Visit: Payer: Self-pay

## 2021-11-25 DIAGNOSIS — Z20822 Contact with and (suspected) exposure to covid-19: Secondary | ICD-10-CM | POA: Diagnosis not present

## 2021-11-25 DIAGNOSIS — Z87891 Personal history of nicotine dependence: Secondary | ICD-10-CM

## 2021-11-25 DIAGNOSIS — T83511A Infection and inflammatory reaction due to indwelling urethral catheter, initial encounter: Secondary | ICD-10-CM | POA: Diagnosis present

## 2021-11-25 DIAGNOSIS — E8809 Other disorders of plasma-protein metabolism, not elsewhere classified: Secondary | ICD-10-CM | POA: Diagnosis present

## 2021-11-25 DIAGNOSIS — E872 Acidosis, unspecified: Secondary | ICD-10-CM

## 2021-11-25 DIAGNOSIS — R5381 Other malaise: Secondary | ICD-10-CM

## 2021-11-25 DIAGNOSIS — D630 Anemia in neoplastic disease: Secondary | ICD-10-CM | POA: Diagnosis present

## 2021-11-25 DIAGNOSIS — N1330 Unspecified hydronephrosis: Secondary | ICD-10-CM | POA: Diagnosis not present

## 2021-11-25 DIAGNOSIS — D638 Anemia in other chronic diseases classified elsewhere: Secondary | ICD-10-CM | POA: Diagnosis present

## 2021-11-25 DIAGNOSIS — E11649 Type 2 diabetes mellitus with hypoglycemia without coma: Secondary | ICD-10-CM | POA: Diagnosis not present

## 2021-11-25 DIAGNOSIS — M1990 Unspecified osteoarthritis, unspecified site: Secondary | ICD-10-CM | POA: Diagnosis present

## 2021-11-25 DIAGNOSIS — E162 Hypoglycemia, unspecified: Secondary | ICD-10-CM | POA: Diagnosis not present

## 2021-11-25 DIAGNOSIS — R531 Weakness: Secondary | ICD-10-CM

## 2021-11-25 DIAGNOSIS — I872 Venous insufficiency (chronic) (peripheral): Secondary | ICD-10-CM | POA: Diagnosis present

## 2021-11-25 DIAGNOSIS — Z887 Allergy status to serum and vaccine status: Secondary | ICD-10-CM

## 2021-11-25 DIAGNOSIS — I10 Essential (primary) hypertension: Secondary | ICD-10-CM | POA: Diagnosis not present

## 2021-11-25 DIAGNOSIS — I959 Hypotension, unspecified: Secondary | ICD-10-CM

## 2021-11-25 DIAGNOSIS — Z8546 Personal history of malignant neoplasm of prostate: Secondary | ICD-10-CM

## 2021-11-25 DIAGNOSIS — E291 Testicular hypofunction: Secondary | ICD-10-CM | POA: Diagnosis present

## 2021-11-25 DIAGNOSIS — Z809 Family history of malignant neoplasm, unspecified: Secondary | ICD-10-CM

## 2021-11-25 DIAGNOSIS — E669 Obesity, unspecified: Secondary | ICD-10-CM

## 2021-11-25 DIAGNOSIS — I4891 Unspecified atrial fibrillation: Secondary | ICD-10-CM

## 2021-11-25 DIAGNOSIS — Z923 Personal history of irradiation: Secondary | ICD-10-CM

## 2021-11-25 DIAGNOSIS — B961 Klebsiella pneumoniae [K. pneumoniae] as the cause of diseases classified elsewhere: Secondary | ICD-10-CM | POA: Diagnosis present

## 2021-11-25 DIAGNOSIS — E538 Deficiency of other specified B group vitamins: Secondary | ICD-10-CM | POA: Diagnosis present

## 2021-11-25 DIAGNOSIS — D649 Anemia, unspecified: Secondary | ICD-10-CM | POA: Diagnosis not present

## 2021-11-25 DIAGNOSIS — T383X5A Adverse effect of insulin and oral hypoglycemic [antidiabetic] drugs, initial encounter: Secondary | ICD-10-CM | POA: Diagnosis present

## 2021-11-25 DIAGNOSIS — Z6831 Body mass index (BMI) 31.0-31.9, adult: Secondary | ICD-10-CM

## 2021-11-25 DIAGNOSIS — N136 Pyonephrosis: Secondary | ICD-10-CM | POA: Diagnosis not present

## 2021-11-25 DIAGNOSIS — C7951 Secondary malignant neoplasm of bone: Secondary | ICD-10-CM | POA: Diagnosis not present

## 2021-11-25 DIAGNOSIS — I480 Paroxysmal atrial fibrillation: Principal | ICD-10-CM | POA: Diagnosis present

## 2021-11-25 DIAGNOSIS — Z978 Presence of other specified devices: Secondary | ICD-10-CM | POA: Diagnosis not present

## 2021-11-25 DIAGNOSIS — Z833 Family history of diabetes mellitus: Secondary | ICD-10-CM

## 2021-11-25 DIAGNOSIS — F952 Tourette's disorder: Secondary | ICD-10-CM | POA: Diagnosis present

## 2021-11-25 DIAGNOSIS — Z825 Family history of asthma and other chronic lower respiratory diseases: Secondary | ICD-10-CM

## 2021-11-25 DIAGNOSIS — E440 Moderate protein-calorie malnutrition: Secondary | ICD-10-CM | POA: Insufficient documentation

## 2021-11-25 DIAGNOSIS — R600 Localized edema: Secondary | ICD-10-CM | POA: Diagnosis not present

## 2021-11-25 DIAGNOSIS — D6481 Anemia due to antineoplastic chemotherapy: Secondary | ICD-10-CM | POA: Diagnosis present

## 2021-11-25 DIAGNOSIS — C61 Malignant neoplasm of prostate: Secondary | ICD-10-CM | POA: Diagnosis not present

## 2021-11-25 DIAGNOSIS — E119 Type 2 diabetes mellitus without complications: Secondary | ICD-10-CM | POA: Insufficient documentation

## 2021-11-25 DIAGNOSIS — E1165 Type 2 diabetes mellitus with hyperglycemia: Secondary | ICD-10-CM

## 2021-11-25 DIAGNOSIS — N3000 Acute cystitis without hematuria: Secondary | ICD-10-CM

## 2021-11-25 DIAGNOSIS — R296 Repeated falls: Secondary | ICD-10-CM | POA: Diagnosis present

## 2021-11-25 DIAGNOSIS — K5730 Diverticulosis of large intestine without perforation or abscess without bleeding: Secondary | ICD-10-CM | POA: Diagnosis not present

## 2021-11-25 DIAGNOSIS — R0602 Shortness of breath: Secondary | ICD-10-CM

## 2021-11-25 DIAGNOSIS — T451X5A Adverse effect of antineoplastic and immunosuppressive drugs, initial encounter: Secondary | ICD-10-CM | POA: Diagnosis present

## 2021-11-25 DIAGNOSIS — Z86718 Personal history of other venous thrombosis and embolism: Secondary | ICD-10-CM

## 2021-11-25 DIAGNOSIS — R197 Diarrhea, unspecified: Secondary | ICD-10-CM

## 2021-11-25 DIAGNOSIS — T501X5A Adverse effect of loop [high-ceiling] diuretics, initial encounter: Secondary | ICD-10-CM | POA: Diagnosis present

## 2021-11-25 DIAGNOSIS — I952 Hypotension due to drugs: Secondary | ICD-10-CM | POA: Diagnosis not present

## 2021-11-25 DIAGNOSIS — Z7901 Long term (current) use of anticoagulants: Secondary | ICD-10-CM

## 2021-11-25 DIAGNOSIS — N3289 Other specified disorders of bladder: Secondary | ICD-10-CM | POA: Diagnosis not present

## 2021-11-25 DIAGNOSIS — Z91199 Patient's noncompliance with other medical treatment and regimen due to unspecified reason: Secondary | ICD-10-CM

## 2021-11-25 DIAGNOSIS — K219 Gastro-esophageal reflux disease without esophagitis: Secondary | ICD-10-CM | POA: Diagnosis present

## 2021-11-25 DIAGNOSIS — D529 Folate deficiency anemia, unspecified: Secondary | ICD-10-CM | POA: Diagnosis not present

## 2021-11-25 DIAGNOSIS — Y846 Urinary catheterization as the cause of abnormal reaction of the patient, or of later complication, without mention of misadventure at the time of the procedure: Secondary | ICD-10-CM | POA: Diagnosis present

## 2021-11-25 DIAGNOSIS — Z83438 Family history of other disorder of lipoprotein metabolism and other lipidemia: Secondary | ICD-10-CM

## 2021-11-25 DIAGNOSIS — R601 Generalized edema: Secondary | ICD-10-CM

## 2021-11-25 DIAGNOSIS — Z8249 Family history of ischemic heart disease and other diseases of the circulatory system: Secondary | ICD-10-CM

## 2021-11-25 DIAGNOSIS — I9589 Other hypotension: Secondary | ICD-10-CM | POA: Diagnosis not present

## 2021-11-25 DIAGNOSIS — E878 Other disorders of electrolyte and fluid balance, not elsewhere classified: Secondary | ICD-10-CM | POA: Diagnosis not present

## 2021-11-25 DIAGNOSIS — E161 Other hypoglycemia: Secondary | ICD-10-CM | POA: Diagnosis not present

## 2021-11-25 DIAGNOSIS — E861 Hypovolemia: Secondary | ICD-10-CM

## 2021-11-25 DIAGNOSIS — E7800 Pure hypercholesterolemia, unspecified: Secondary | ICD-10-CM | POA: Diagnosis present

## 2021-11-25 DIAGNOSIS — Z7984 Long term (current) use of oral hypoglycemic drugs: Secondary | ICD-10-CM

## 2021-11-25 DIAGNOSIS — K8020 Calculus of gallbladder without cholecystitis without obstruction: Secondary | ICD-10-CM | POA: Diagnosis not present

## 2021-11-25 DIAGNOSIS — T447X5A Adverse effect of beta-adrenoreceptor antagonists, initial encounter: Secondary | ICD-10-CM | POA: Diagnosis present

## 2021-11-25 DIAGNOSIS — N390 Urinary tract infection, site not specified: Secondary | ICD-10-CM

## 2021-11-25 DIAGNOSIS — Z79899 Other long term (current) drug therapy: Secondary | ICD-10-CM

## 2021-11-25 LAB — COMPREHENSIVE METABOLIC PANEL
ALT: 22 U/L (ref 0–44)
AST: 17 U/L (ref 15–41)
Albumin: 1.7 g/dL — ABNORMAL LOW (ref 3.5–5.0)
Alkaline Phosphatase: 79 U/L (ref 38–126)
Anion gap: 6 (ref 5–15)
BUN: 20 mg/dL (ref 8–23)
CO2: 21 mmol/L — ABNORMAL LOW (ref 22–32)
Calcium: 8.8 mg/dL — ABNORMAL LOW (ref 8.9–10.3)
Chloride: 111 mmol/L (ref 98–111)
Creatinine, Ser: 0.65 mg/dL (ref 0.61–1.24)
GFR, Estimated: >60 mL/min (ref >60)
Glucose, Bld: 45 mg/dL — ABNORMAL LOW (ref 70–99)
Potassium: 3.9 mmol/L (ref 3.5–5.1)
Sodium: 138 mmol/L (ref 135–145)
Total Bilirubin: <0.1 mg/dL — ABNORMAL LOW (ref 0.3–1.2)
Total Protein: 5.8 g/dL — ABNORMAL LOW (ref 6.5–8.1)

## 2021-11-25 LAB — CBC WITH DIFFERENTIAL/PLATELET
Abs Immature Granulocytes: 0.07 10*3/uL (ref 0.00–0.07)
Basophils Absolute: 0 10*3/uL (ref 0.0–0.1)
Basophils Relative: 0 %
Eosinophils Absolute: 0 10*3/uL (ref 0.0–0.5)
Eosinophils Relative: 0 %
HCT: 24.1 % — ABNORMAL LOW (ref 39.0–52.0)
Hemoglobin: 7.4 g/dL — ABNORMAL LOW (ref 13.0–17.0)
Immature Granulocytes: 1 %
Lymphocytes Relative: 8 %
Lymphs Abs: 0.5 10*3/uL — ABNORMAL LOW (ref 0.7–4.0)
MCH: 28.5 pg (ref 26.0–34.0)
MCHC: 30.7 g/dL (ref 30.0–36.0)
MCV: 92.7 fL (ref 80.0–100.0)
Monocytes Absolute: 0.3 10*3/uL (ref 0.1–1.0)
Monocytes Relative: 5 %
Neutro Abs: 5.2 10*3/uL (ref 1.7–7.7)
Neutrophils Relative %: 86 %
Platelets: UNDETERMINED 10*3/uL (ref 150–400)
RBC: 2.6 MIL/uL — ABNORMAL LOW (ref 4.22–5.81)
RDW: 20.9 % — ABNORMAL HIGH (ref 11.5–15.5)
WBC: 6.1 10*3/uL (ref 4.0–10.5)
nRBC: 0.3 % — ABNORMAL HIGH (ref 0.0–0.2)

## 2021-11-25 LAB — URINALYSIS, ROUTINE W REFLEX MICROSCOPIC
Bilirubin Urine: NEGATIVE
Glucose, UA: 150 mg/dL — AB
Ketones, ur: NEGATIVE mg/dL
Nitrite: NEGATIVE
Protein, ur: NEGATIVE mg/dL
Specific Gravity, Urine: 1.006 (ref 1.005–1.030)
pH: 5 (ref 5.0–8.0)

## 2021-11-25 LAB — MAGNESIUM: Magnesium: 1.4 mg/dL — ABNORMAL LOW (ref 1.7–2.4)

## 2021-11-25 LAB — PROTIME-INR
INR: 2.7 — ABNORMAL HIGH (ref 0.8–1.2)
Prothrombin Time: 28.6 seconds — ABNORMAL HIGH (ref 11.4–15.2)

## 2021-11-25 LAB — CBG MONITORING, ED
Glucose-Capillary: 42 mg/dL — CL (ref 70–99)
Glucose-Capillary: 43 mg/dL — CL (ref 70–99)

## 2021-11-25 LAB — PREPARE RBC (CROSSMATCH)

## 2021-11-25 LAB — RESP PANEL BY RT-PCR (FLU A&B, COVID) ARPGX2
Influenza A by PCR: NEGATIVE
Influenza B by PCR: NEGATIVE
SARS Coronavirus 2 by RT PCR: NEGATIVE

## 2021-11-25 LAB — LACTIC ACID, PLASMA
Lactic Acid, Venous: 1.6 mmol/L (ref 0.5–1.9)
Lactic Acid, Venous: 2.2 mmol/L (ref 0.5–1.9)

## 2021-11-25 LAB — POC OCCULT BLOOD, ED: Fecal Occult Bld: NEGATIVE

## 2021-11-25 LAB — APTT: aPTT: 66 seconds — ABNORMAL HIGH (ref 24–36)

## 2021-11-25 MED ORDER — WARFARIN - PHARMACIST DOSING INPATIENT: Freq: Every day | Status: DC

## 2021-11-25 MED ORDER — SODIUM CHLORIDE 0.9 % IV SOLN
10.0000 mL/h | Freq: Once | INTRAVENOUS | Status: AC
  Administered 2021-11-26: 10 mL/h via INTRAVENOUS

## 2021-11-25 MED ORDER — DEXTROSE 50 % IV SOLN
1.0000 | INTRAVENOUS | Status: DC | PRN
  Administered 2021-11-26: 50 mL via INTRAVENOUS
  Filled 2021-11-25: qty 50

## 2021-11-25 MED ORDER — LACTATED RINGERS IV SOLN: INTRAVENOUS | Status: DC

## 2021-11-25 MED ORDER — ALBUMIN HUMAN 25 % IV SOLN
12.5000 g | Freq: Once | INTRAVENOUS | Status: AC
  Administered 2021-11-26: 12.5 g via INTRAVENOUS
  Filled 2021-11-25: qty 50

## 2021-11-25 MED ORDER — SODIUM CHLORIDE (PF) 0.9 % IJ SOLN
INTRAMUSCULAR | Status: AC
  Filled 2021-11-25: qty 50

## 2021-11-25 MED ORDER — MAGNESIUM SULFATE 2 GM/50ML IV SOLN
2.0000 g | Freq: Once | INTRAVENOUS | Status: AC
  Administered 2021-11-25: 2 g via INTRAVENOUS
  Filled 2021-11-25: qty 50

## 2021-11-25 MED ORDER — SODIUM CHLORIDE 0.9 % IV SOLN
1.0000 g | Freq: Once | INTRAVENOUS | Status: AC
  Administered 2021-11-25: 1 g via INTRAVENOUS
  Filled 2021-11-25: qty 10

## 2021-11-25 MED ORDER — IOHEXOL 300 MG/ML  SOLN
100.0000 mL | Freq: Once | INTRAMUSCULAR | Status: AC | PRN
  Administered 2021-11-25: 100 mL via INTRAVENOUS

## 2021-11-25 MED ORDER — SODIUM CHLORIDE 0.9 % IV SOLN
1.0000 g | INTRAVENOUS | Status: DC
  Administered 2021-11-26 – 2021-11-27 (×2): 1 g via INTRAVENOUS
  Filled 2021-11-25 (×2): qty 10

## 2021-11-25 NOTE — Telephone Encounter (Signed)
Patient called in states that for the last few days his blood sugars have been running in the 40's all day long. He has been eating regularly and even when he eats things with sugar it doesn't go up. He is faxing report over to [name]. Blood sugar currently is 110. ?

## 2021-11-25 NOTE — H&P (Incomplete)
?History and Physical  ? ? ?Patient: Tommy Yang JWJ:[id number] DOB: [birth year] ?DOA: 11/25/2021 ?DOS: the patient was seen and examined on 11/25/2021 ?PCP: [name], MD  ?Patient coming from: Home ? ?Chief Complaint: No chief complaint on file. ? ?HPI: Tommy Yang is a 71 y.o. male with medical history significant of ***  ?Review of Systems: {ROS_Text:26778} ?Past Medical History:  ?Diagnosis Date  ? Arthritis   ? Atrial fibrillation ([place])   ? Complication of anesthesia   ? needs to sit up  at 45 degree angle or gets afib during surgery or in recovery  ? Diabetes mellitus without complication ([place])   ? type 2  ? DVT (deep venous thrombosis) ([place]) 2015  ? left leg.behind knee  ? Foley catheter in place last changed 2eeks ago  ? last 7 months  ? GERD (gastroesophageal reflux disease)   ? Hx of radiation therapy 40 tx 2015  ? Hypercholesterolemia   ? Hypertension   ? Prostate cancer ([place]) 12/06/13  ? gleason 4+3=7, 6/12 cores positive. seed implant and radiation  ? Tourette's syndrome   ? Urinary retention   ? Wears glasses   ? ?Past Surgical History:  ?Procedure Laterality Date  ? APPENDECTOMY  1968  ? BALLOON DILATION N/A 08/02/2015  ? Procedure: BALLOON DILATION;  Surgeon: [name], MD;  Location: [facility];  Service: Endoscopy;  Laterality: N/A;  ? CARDIOVERSION N/A 05/13/2018  ? Procedure: CARDIOVERSION;  Surgeon: Sanda Klein, MD;  Location: [place];  Service: Cardiovascular;  Laterality: N/A;  ? CARDIOVERSION N/A 05/18/2018  ? Procedure: CARDIOVERSION;  Surgeon: Buford Dresser, MD;  Location: [facility];  Service: Cardiovascular;  Laterality: N/A;  ? COLONOSCOPY WITH PROPOFOL N/A 08/02/2015  ? Procedure: COLONOSCOPY WITH PROPOFOL w/ APC;  Surgeon: [name], MD;  Location: [facility];  Service: Endoscopy;  Laterality: N/A;  ? CYSTOSCOPY N/A 10/05/2020  ? Procedure: CYSTOSCOPY;  Surgeon: Alexis Frock, MD;  Location: [facility];  Service: Urology;   Laterality: N/A;  ? ESOPHAGOGASTRODUODENOSCOPY (EGD) WITH PROPOFOL N/A 08/02/2015  ? Procedure: ESOPHAGOGASTRODUODENOSCOPY (EGD) WITH PROPOFOL/ possible dilation.;  Surgeon: [name], MD;  Location: [facility];  Service: Endoscopy;  Laterality: N/A;  ? KNEE SURGERY  2001  ? right knee orthoscopic  ? PROSTATE BIOPSY  12/06/13  ? Gleason 4+3=7, volume 35 gm  ? [place]  ? Tourette'ssyndrome spinal fluid removed   ? TONSILLECTOMY  as child  ? TRANSURETHRAL RESECTION OF PROSTATE N/A 09/30/2019  ? Procedure: TRANSURETHRAL RESECTION OF THE PROSTATE (TURP);  Surgeon: Alexis Frock, MD;  Location: [facility];  Service: Urology;  Laterality: N/A;  1 HR  ? TRANSURETHRAL RESECTION OF PROSTATE N/A 10/05/2020  ? Procedure: TRANSURETHRAL RESECTION OF THE PROSTATE (TURP);  Surgeon: Alexis Frock, MD;  Location: [facility];  Service: Urology;  Laterality: N/A;  1 HR  ? ?Social History:  reports that he quit smoking about 9 years ago. His smoking use included cigarettes. He has a 11.50 pack-year smoking history. He has never used smokeless tobacco. He reports that he does not currently use alcohol. He reports that he does not use drugs. ? ?Allergies  ?Allergen Reactions  ? Shingrix [Zoster Vac Recomb Adjuvanted] Shortness Of Breath and Other (See Comments)  ?  Pulse increased to 170+ and it affected the A-Fib  ? Other   ?  shingls shot caused atrial fib did not take 2nd shot  ? ? ?Family  History  ?Problem Relation Age of Onset  ? Heart disease Mother   ?     before age 52  ? Diabetes Mother   ? Hyperlipidemia Mother   ? Varicose Veins Mother   ? Hypertension Mother   ? Heart disease Father   ? Diabetes Father   ? Hyperlipidemia Father   ? Hypertension Father   ? Cancer Brother   ? COPD Brother   ? Diabetes Brother   ? Hyperlipidemia Brother   ? Hypertension Brother   ? ? ?Prior to Admission medications   ?Medication Sig Start Date End Date Taking? Authorizing Provider   ?acetaminophen (TYLENOL) 500 MG tablet Take 500-1,000 mg by mouth every 6 (six) hours as needed (for pain).    [provider]  ?amiodarone (PACERONE) 200 MG tablet Take 1 tablet (200 mg total) by mouth daily. 11/21/21   Sherran Needs, NP  ?atorvastatin (LIPITOR) 80 MG tablet TAKE 1 TABLET BY MOUTH EVERY DAY 11/13/21   Elayne Snare, MD  ?blood glucose meter kit and supplies KIT Test blood sugar daily as directed. Dx code: E11.9 01/23/15   Darlyne Russian, MD  ?CALCIUM PO Take 1,000 mg by mouth daily.    [provider]  ?Cholecalciferol (VITAMIN D3) 10 MCG (400 UNIT) CAPS Take 400 Units by mouth daily.    [provider]  ?Cyanocobalamin (VITAMIN B 12 PO) Take 1,000 mcg by mouth daily.    [provider]  ?diphenoxylate-atropine (LOMOTIL) 2.5-0.025 MG tablet TAKE 1 TABLET BY MOUTH 4 (FOUR) TIMES DAILY AS NEEDED FOR DIARRHEA OR LOOSE STOOLS. 08/02/21   [name], MD  ?Empagliflozin-metFORMIN HCl ER (SYNJARDY XR) 12.12-998 MG TB24 TAKE 2 TABLETS BY MOUTH EVERY DAY 04/29/21   Elayne Snare, MD  ?ERLEADA 60 MG tablet Take 240 mg by mouth daily. 01/30/21   [provider]  ?furosemide (LASIX) 40 MG tablet Take 1 tablet (40 mg total) by mouth daily. 09/12/21   Little Ishikawa, MD  ?glimepiride (AMARYL) 1 MG tablet TAKE 1 TABLET BY MOUTH EVERY DAY WITH BREAKFAST ?Patient not taking: Reported on 11/21/2021 11/18/21   Elayne Snare, MD  ?glucose blood (ACCU-CHEK GUIDE) test strip Use as instructed to check blood sugar twice daily. 10/29/18   Elayne Snare, MD  ?leuprolide (LUPRON) 11.25 MG injection Inject 11.25 mg into the muscle every 6 (six) months.    [provider]  ?metoprolol tartrate (LOPRESSOR) 100 MG tablet Take 1 tablet (100 mg total) by mouth 2 (two) times daily. 11/18/21   Sherran Needs, NP  ?pantoprazole (PROTONIX) 40 MG tablet TAKE 1 TABLET BY MOUTH EVERY DAY 11/18/21   [name], MD  ?Semaglutide,0.25 or 0.5MG/DOS, (OZEMPIC, 0.25 OR 0.5 MG/DOSE,) 2  MG/1.5ML SOPN Inject 0.5 mg into the skin once a week. ?Patient taking differently: Inject 1 mg into the skin every Friday. 07/10/21   Elayne Snare, MD  ?tamsulosin (FLOMAX) 0.4 MG CAPS capsule Take 1 capsule (0.4 mg total) by mouth 2 (two) times daily. 09/24/21   Sherran Needs, NP  ?tolterodine (DETROL) 2 MG tablet SMARTSIG:1 Tablet(s) By Mouth 1-2 Times Daily 10/08/21   [provider]  ?warfarin (COUMADIN) 5 MG tablet Take 1 tablet (5 mg total) by mouth daily after breakfast. 09/12/21 11/21/21  Little Ishikawa, MD  ?zinc gluconate 50 MG tablet Take 50 mg by mouth daily.    [provider]  ? ? ?Physical Exam: ?Vitals:  ? 11/25/21 1858 11/25/21 2030 11/25/21 2033 11/25/21  2200  ?BP:  102/64  106/66  ?Pulse:  (!) 118  (!) 135  ?Resp:  13  (!) 1  ?Temp:      ?TempSrc:      ?SpO2:  93%  (!) 87%  ?Weight: 102.5 kg  103 kg   ?Height: _0  (1.803 m)  _1  (1.803 m)   ? ?*** ?Data Reviewed: ?{Tip this will not be part of the note when signed- Document your independent interpretation of telemetry tracing, EKG, lab, Radiology test or any other diagnostic tests. Add any new diagnostic test ordered today. ?(Optional):26781} ?{Results:26384} ? ?Assessment and Plan: ?No notes have been filed under this hospital service. ?Service: Hospitalist ? ? ? ? Advance Care Planning:   Code Status: Prior *** ? ?Consults: *** ? ?Family Communication: *** ? ?Severity of Illness: ?{Observation/Inpatient:21159} ? ?Author: Orene Desanctis, DO ?11/25/2021 10:36 PM ? ?For on call review [email or web address].  ?

## 2021-11-25 NOTE — Telephone Encounter (Signed)
Pt report attached for provider review. ?

## 2021-11-25 NOTE — ED Triage Notes (Signed)
BIBA ?Per EMS: Pt coming from home w/ c/o hypoglycemia x 2 days and generalized weakness. 40cbg, 58 cbg, pt then drank OJ and now CBG 123.  ?Hx bone CA. ?Hx AFIB  ?VSS ?

## 2021-11-25 NOTE — ED Provider Notes (Signed)
?[facility] DEPT ?Provider Note ? ? ?CSN: [id number] ?Arrival date & time: 11/25/21  1831 ? ?  ? ?History ? ?No chief complaint on file. ? ? ?Tommy Yang is a 71 y.o. male. ? ?Pt is a 71 yo male with multiple medical problems.  He has htn, dvt on coumadin, gerd, hypercholesterolemia, afib, dm, prostate cancer with bone mets, anemia, tourettes's syndrome, and urinary retention with indwelling foley.  Pt has had problems with anemia and has had several recent blood transfusions.  He did see cards last week for his afib.  He was noted to be in afib with rvr.  He was hypotensive and so she stopped his losartan.  His hr was elevated, but due to hypotension, she could not add any more beta blocker.  He had been out of his amiodarone for a few days.  Pt had 2 units of blood transfused on 2/24.  Pt has had diarrhea.  He has also been hypoglycemic by numbers, but did not feel hypoglycemic.  He checked on his wife's monitor as well and it also read low.  Pt has had several falls.  He feels weak.  ?  ? ? ?  ? ?Home Medications ?Prior to Admission medications   ?Medication Sig Start Date End Date Taking? Authorizing Provider  ?acetaminophen (TYLENOL) 500 MG tablet Take 500-1,000 mg by mouth every 6 (six) hours as needed (for pain).    [provider]  ?amiodarone (PACERONE) 200 MG tablet Take 1 tablet (200 mg total) by mouth daily. 11/21/21   Sherran Needs, NP  ?atorvastatin (LIPITOR) 80 MG tablet TAKE 1 TABLET BY MOUTH EVERY DAY 11/13/21   Elayne Snare, MD  ?blood glucose meter kit and supplies KIT Test blood sugar daily as directed. Dx code: E11.9 01/23/15   Darlyne Russian, MD  ?CALCIUM PO Take 1,000 mg by mouth daily.    [provider]  ?Cholecalciferol (VITAMIN D3) 10 MCG (400 UNIT) CAPS Take 400 Units by mouth daily.    [provider]  ?Cyanocobalamin (VITAMIN B 12 PO) Take 1,000 mcg by mouth daily.    [provider]  ?diphenoxylate-atropine  (LOMOTIL) 2.5-0.025 MG tablet TAKE 1 TABLET BY MOUTH 4 (FOUR) TIMES DAILY AS NEEDED FOR DIARRHEA OR LOOSE STOOLS. 08/02/21   [name], MD  ?Empagliflozin-metFORMIN HCl ER (SYNJARDY XR) 12.12-998 MG TB24 TAKE 2 TABLETS BY MOUTH EVERY DAY 04/29/21   Elayne Snare, MD  ?ERLEADA 60 MG tablet Take 240 mg by mouth daily. 01/30/21   [provider]  ?furosemide (LASIX) 40 MG tablet Take 1 tablet (40 mg total) by mouth daily. 09/12/21   Little Ishikawa, MD  ?glimepiride (AMARYL) 1 MG tablet TAKE 1 TABLET BY MOUTH EVERY DAY WITH BREAKFAST ?Patient not taking: Reported on 11/21/2021 11/18/21   Elayne Snare, MD  ?glucose blood (ACCU-CHEK GUIDE) test strip Use as instructed to check blood sugar twice daily. 10/29/18   Elayne Snare, MD  ?leuprolide (LUPRON) 11.25 MG injection Inject 11.25 mg into the muscle every 6 (six) months.    [provider]  ?metoprolol tartrate (LOPRESSOR) 100 MG tablet Take 1 tablet (100 mg total) by mouth 2 (two) times daily. 11/18/21   Sherran Needs, NP  ?pantoprazole (PROTONIX) 40 MG tablet TAKE 1 TABLET BY MOUTH EVERY DAY 11/18/21   [name], MD  ?Semaglutide,0.25 or 0.5MG/DOS, (OZEMPIC, 0.25 OR 0.5 MG/DOSE,) 2 MG/1.5ML SOPN Inject 0.5 mg into the skin once a week. ?Patient taking  differently: Inject 1 mg into the skin every Friday. 07/10/21   Elayne Snare, MD  ?tamsulosin (FLOMAX) 0.4 MG CAPS capsule Take 1 capsule (0.4 mg total) by mouth 2 (two) times daily. 09/24/21   Sherran Needs, NP  ?tolterodine (DETROL) 2 MG tablet SMARTSIG:1 Tablet(s) By Mouth 1-2 Times Daily 10/08/21   [provider]  ?warfarin (COUMADIN) 5 MG tablet Take 1 tablet (5 mg total) by mouth daily after breakfast. 09/12/21 11/21/21  Little Ishikawa, MD  ?zinc gluconate 50 MG tablet Take 50 mg by mouth daily.    [provider]  ?   ? ?Allergies    ?Shingrix [zoster vac recomb adjuvanted] and Other   ? ?Review of Systems   ?Review of Systems  ?Gastrointestinal:  Positive for diarrhea.   ?Neurological:  Positive for weakness.  ?All other systems reviewed and are negative. ? ?Physical Exam ?Updated Vital Signs ?BP 106/66   Pulse (!) 135   Temp 98.5 ?F (36.9 ?C) (Oral)   Resp (!) 1   Ht '5\' 11"'  (1.803 m)   Wt 103 kg   SpO2 (!) 87%   BMI 31.66 kg/m?  ?Physical Exam ?Vitals and nursing note reviewed. Exam conducted with a chaperone present.  ?Constitutional:   ?   Appearance: Normal appearance. He is obese. He is ill-appearing.  ?HENT:  ?   Head: Normocephalic and atraumatic.  ?   Right Ear: External ear normal.  ?   Left Ear: External ear normal.  ?   Nose: Nose normal.  ?   Mouth/Throat:  ?   Mouth: Mucous membranes are dry.  ?Eyes:  ?   Extraocular Movements: Extraocular movements intact.  ?   Conjunctiva/sclera: Conjunctivae normal.  ?   Pupils: Pupils are equal, round, and reactive to light.  ?Cardiovascular:  ?   Rate and Rhythm: Tachycardia present. Rhythm irregular.  ?   Pulses: Normal pulses.  ?   Heart sounds: Normal heart sounds.  ?   Comments: Generalized anasarca ?Pulmonary:  ?   Effort: Pulmonary effort is normal.  ?   Breath sounds: Normal breath sounds.  ?Abdominal:  ?   General: Abdomen is flat. Bowel sounds are normal.  ?   Palpations: Abdomen is soft.  ?Genitourinary: ?   Rectum: Guaiac result negative.  ?   Comments: Indwelling foley present on admission ?Musculoskeletal:     ?   General: Normal range of motion.  ?   Cervical back: Normal range of motion and neck supple.  ?   Right lower leg: Edema present.  ?   Left lower leg: Edema present.  ?Skin: ?   General: Skin is warm.  ?   Capillary Refill: Capillary refill takes less than 2 seconds.  ?Neurological:  ?   General: No focal deficit present.  ?   Mental Status: He is alert and oriented to person, place, and time.  ?   Comments: Twitching to face c/w Tourette's  ?Psychiatric:     ?   Mood and Affect: Mood normal.     ?   Behavior: Behavior normal.  ? ? ?ED Results / Procedures / Treatments   ?Labs ?(all labs ordered are  listed, but only abnormal results are displayed) ?Labs Reviewed  ?COMPREHENSIVE METABOLIC PANEL - Abnormal; Notable for the following components:  ?    Result Value  ? CO2 21 (*)   ? Glucose, Bld 45 (*)   ? Calcium 8.8 (*)   ? Total Protein 5.8 (*)   ?  Albumin 1.7 (*)   ? Total Bilirubin <0.1 (*)   ? All other components within normal limits  ?CBC WITH DIFFERENTIAL/PLATELET - Abnormal; Notable for the following components:  ? RBC 2.60 (*)   ? Hemoglobin 7.4 (*)   ? HCT 24.1 (*)   ? RDW 20.9 (*)   ? nRBC 0.3 (*)   ? Lymphs Abs 0.5 (*)   ? All other components within normal limits  ?PROTIME-INR - Abnormal; Notable for the following components:  ? Prothrombin Time 28.6 (*)   ? INR 2.7 (*)   ? All other components within normal limits  ?APTT - Abnormal; Notable for the following components:  ? aPTT 66 (*)   ? All other components within normal limits  ?URINALYSIS, ROUTINE W REFLEX MICROSCOPIC - Abnormal; Notable for the following components:  ? Color, Urine YELLOW (*)   ? APPearance CLOUDY (*)   ? Glucose, UA 150 (*)   ? Hgb urine dipstick MODERATE (*)   ? Leukocytes,Ua LARGE (*)   ? Bacteria, UA MANY (*)   ? All other components within normal limits  ?MAGNESIUM - Abnormal; Notable for the following components:  ? Magnesium 1.4 (*)   ? All other components within normal limits  ?RESP PANEL BY RT-PCR (FLU A&B, COVID) [id number]  ?CULTURE, BLOOD (ROUTINE X 2)  ?CULTURE, BLOOD (ROUTINE X 2)  ?URINE CULTURE  ?GASTROINTESTINAL PANEL BY PCR, STOOL (REPLACES STOOL CULTURE)  ?C DIFFICILE QUICK SCREEN W PCR REFLEX    ?LACTIC ACID, PLASMA  ?LACTIC ACID, PLASMA  ?TSH  ?PROTIME-INR  ?POC OCCULT BLOOD, ED  ?TYPE AND SCREEN  ?PREPARE RBC (CROSSMATCH)  ? ? ?EKG ?EKG Interpretation ? ?Date/Time:  Monday November 25 2021 19:27:44 EDT ?Ventricular Rate:  108 ?PR Interval:    ?QRS Duration: 74 ?QT Interval:  335 ?QTC Calculation: 449 ?R Axis:   54 ?Text Interpretation: Atrial fibrillation Paired ventricular premature complexes Low voltage,  extremity and precordial leads No significant change since last tracing Confirmed by Isla Pence [phone]) on 11/25/2021 10:36:26 PM ? ?Radiology ?CT ABDOMEN PELVIS W CONTRAST ? ?Result Date: 11/25/2021 ?CLINICAL DATA:  Tommy Yang

## 2021-11-25 NOTE — ED Notes (Signed)
8 oz orange juice and graham crackers provided. Pt currently asymptomatic with hypoglycemia. ?

## 2021-11-26 ENCOUNTER — Inpatient Hospital Stay: Payer: BC Managed Care – PPO | Admitting: Oncology

## 2021-11-26 ENCOUNTER — Inpatient Hospital Stay: Payer: BC Managed Care – PPO

## 2021-11-26 DIAGNOSIS — E119 Type 2 diabetes mellitus without complications: Secondary | ICD-10-CM | POA: Insufficient documentation

## 2021-11-26 DIAGNOSIS — I9589 Other hypotension: Secondary | ICD-10-CM | POA: Diagnosis not present

## 2021-11-26 DIAGNOSIS — E872 Acidosis, unspecified: Secondary | ICD-10-CM

## 2021-11-26 DIAGNOSIS — E440 Moderate protein-calorie malnutrition: Secondary | ICD-10-CM | POA: Insufficient documentation

## 2021-11-26 DIAGNOSIS — Z86718 Personal history of other venous thrombosis and embolism: Secondary | ICD-10-CM

## 2021-11-26 DIAGNOSIS — T83511A Infection and inflammatory reaction due to indwelling urethral catheter, initial encounter: Secondary | ICD-10-CM

## 2021-11-26 DIAGNOSIS — E669 Obesity, unspecified: Secondary | ICD-10-CM

## 2021-11-26 DIAGNOSIS — N1330 Unspecified hydronephrosis: Secondary | ICD-10-CM

## 2021-11-26 DIAGNOSIS — I4891 Unspecified atrial fibrillation: Secondary | ICD-10-CM | POA: Diagnosis not present

## 2021-11-26 DIAGNOSIS — I959 Hypotension, unspecified: Secondary | ICD-10-CM

## 2021-11-26 DIAGNOSIS — R531 Weakness: Secondary | ICD-10-CM

## 2021-11-26 DIAGNOSIS — E538 Deficiency of other specified B group vitamins: Secondary | ICD-10-CM

## 2021-11-26 DIAGNOSIS — E11649 Type 2 diabetes mellitus with hypoglycemia without coma: Secondary | ICD-10-CM | POA: Insufficient documentation

## 2021-11-26 DIAGNOSIS — R5381 Other malaise: Secondary | ICD-10-CM

## 2021-11-26 DIAGNOSIS — D638 Anemia in other chronic diseases classified elsewhere: Secondary | ICD-10-CM

## 2021-11-26 DIAGNOSIS — N390 Urinary tract infection, site not specified: Secondary | ICD-10-CM

## 2021-11-26 LAB — CBC
HCT: 25.2 % — ABNORMAL LOW (ref 39.0–52.0)
Hemoglobin: 7.5 g/dL — ABNORMAL LOW (ref 13.0–17.0)
MCH: 28.2 pg (ref 26.0–34.0)
MCHC: 29.8 g/dL — ABNORMAL LOW (ref 30.0–36.0)
MCV: 94.7 fL (ref 80.0–100.0)
Platelets: 114 10*3/uL — ABNORMAL LOW (ref 150–400)
RBC: 2.66 MIL/uL — ABNORMAL LOW (ref 4.22–5.81)
RDW: 21.2 % — ABNORMAL HIGH (ref 11.5–15.5)
WBC: 6.2 10*3/uL (ref 4.0–10.5)
nRBC: 0.5 % — ABNORMAL HIGH (ref 0.0–0.2)

## 2021-11-26 LAB — TSH: TSH: 3.053 u[IU]/mL (ref 0.350–4.500)

## 2021-11-26 LAB — GLUCOSE, CAPILLARY
Glucose-Capillary: 67 mg/dL — ABNORMAL LOW (ref 70–99)
Glucose-Capillary: 63 mg/dL — ABNORMAL LOW (ref 70–99)
Glucose-Capillary: 71 mg/dL (ref 70–99)
Glucose-Capillary: 88 mg/dL (ref 70–99)
Glucose-Capillary: 118 mg/dL — ABNORMAL HIGH (ref 70–99)
Glucose-Capillary: 161 mg/dL — ABNORMAL HIGH (ref 70–99)
Glucose-Capillary: 137 mg/dL — ABNORMAL HIGH (ref 70–99)
Glucose-Capillary: 158 mg/dL — ABNORMAL HIGH (ref 70–99)

## 2021-11-26 LAB — RETICULOCYTES
Immature Retic Fract: 41.9 % — ABNORMAL HIGH (ref 2.3–15.9)
RBC.: 2.79 MIL/uL — ABNORMAL LOW (ref 4.22–5.81)
Retic Count, Absolute: 41.3 10*3/uL (ref 19.0–186.0)
Retic Ct Pct: 1.5 % (ref 0.4–3.1)

## 2021-11-26 LAB — IRON AND TIBC
Iron: 52 ug/dL (ref 45–182)
Saturation Ratios: 31 % (ref 17.9–39.5)
TIBC: 170 ug/dL — ABNORMAL LOW (ref 250–450)
UIBC: 118 ug/dL

## 2021-11-26 LAB — BASIC METABOLIC PANEL
Anion gap: 7 (ref 5–15)
BUN: 18 mg/dL (ref 8–23)
CO2: 21 mmol/L — ABNORMAL LOW (ref 22–32)
Calcium: 8.8 mg/dL — ABNORMAL LOW (ref 8.9–10.3)
Chloride: 110 mmol/L (ref 98–111)
Creatinine, Ser: 0.81 mg/dL (ref 0.61–1.24)
GFR, Estimated: >60 mL/min (ref >60)
Glucose, Bld: 58 mg/dL — ABNORMAL LOW (ref 70–99)
Potassium: 4.1 mmol/L (ref 3.5–5.1)
Sodium: 138 mmol/L (ref 135–145)

## 2021-11-26 LAB — C DIFFICILE QUICK SCREEN W PCR REFLEX
C Diff antigen: NEGATIVE
C Diff interpretation: NOT DETECTED
C Diff toxin: NEGATIVE

## 2021-11-26 LAB — FOLATE: Folate: 2.5 ng/mL — ABNORMAL LOW (ref >5.9)

## 2021-11-26 LAB — PROTIME-INR
INR: 2.6 — ABNORMAL HIGH (ref 0.8–1.2)
Prothrombin Time: 28.2 seconds — ABNORMAL HIGH (ref 11.4–15.2)

## 2021-11-26 LAB — HEMOGLOBIN AND HEMATOCRIT, BLOOD
HCT: 27.9 % — ABNORMAL LOW (ref 39.0–52.0)
Hemoglobin: 8.4 g/dL — ABNORMAL LOW (ref 13.0–17.0)

## 2021-11-26 LAB — LACTIC ACID, PLASMA
Lactic Acid, Venous: 2.5 mmol/L (ref 0.5–1.9)
Lactic Acid, Venous: 2.4 mmol/L (ref 0.5–1.9)

## 2021-11-26 LAB — FERRITIN: Ferritin: 983 ng/mL — ABNORMAL HIGH (ref 24–336)

## 2021-11-26 LAB — CORTISOL-AM, BLOOD: Cortisol - AM: 24 ug/dL — ABNORMAL HIGH (ref 6.7–22.6)

## 2021-11-26 LAB — VITAMIN B12: Vitamin B-12: 272 pg/mL (ref 180–914)

## 2021-11-26 LAB — CBG MONITORING, ED: Glucose-Capillary: 111 mg/dL — ABNORMAL HIGH (ref 70–99)

## 2021-11-26 LAB — LACTATE DEHYDROGENASE: LDH: 287 U/L — ABNORMAL HIGH (ref 98–192)

## 2021-11-26 MED ORDER — ENSURE ENLIVE PO LIQD
237.0000 mL | Freq: Two times a day (BID) | ORAL | Status: DC
  Administered 2021-11-26: 237 mL via ORAL

## 2021-11-26 MED ORDER — METOPROLOL TARTRATE 25 MG PO TABS
12.5000 mg | ORAL_TABLET | Freq: Two times a day (BID) | ORAL | Status: DC
  Administered 2021-11-26: 12.5 mg via ORAL
  Filled 2021-11-26: qty 1

## 2021-11-26 MED ORDER — AMIODARONE HCL 200 MG PO TABS
200.0000 mg | ORAL_TABLET | Freq: Every day | ORAL | Status: DC
  Administered 2021-11-26 – 2021-11-28 (×3): 200 mg via ORAL
  Filled 2021-11-26 (×3): qty 1

## 2021-11-26 MED ORDER — WARFARIN SODIUM 5 MG PO TABS
5.0000 mg | ORAL_TABLET | Freq: Once | ORAL | Status: AC
  Administered 2021-11-26: 5 mg via ORAL
  Filled 2021-11-26: qty 1

## 2021-11-26 MED ORDER — CYANOCOBALAMIN 1000 MCG/ML IJ SOLN
1000.0000 ug | Freq: Once | INTRAMUSCULAR | Status: DC
  Filled 2021-11-26: qty 1

## 2021-11-26 MED ORDER — BOOST PLUS PO LIQD
237.0000 mL | Freq: Two times a day (BID) | ORAL | Status: DC
  Administered 2021-11-26 – 2021-11-28 (×4): 237 mL via ORAL
  Filled 2021-11-26 (×5): qty 237

## 2021-11-26 MED ORDER — FOLIC ACID 1 MG PO TABS
1.0000 mg | ORAL_TABLET | Freq: Every day | ORAL | Status: DC
Start: 2021-11-26 — End: 2021-11-28
  Administered 2021-11-26 – 2021-11-28 (×3): 1 mg via ORAL
  Filled 2021-11-26 (×3): qty 1

## 2021-11-26 MED ORDER — VITAMIN B-12 1000 MCG PO TABS
1000.0000 ug | ORAL_TABLET | Freq: Every day | ORAL | Status: DC
Start: 2021-11-26 — End: 2021-11-28
  Administered 2021-11-26 – 2021-11-28 (×3): 1000 ug via ORAL
  Filled 2021-11-26 (×3): qty 1

## 2021-11-26 NOTE — Assessment & Plan Note (Addendum)
Recent Labs  ?  09/24/21 ?0925 10/11/21 ?1157 10/21/21 ?2620 10/24/21 ?0900 10/25/21 ?1102 11/25/21 ?3559 11/26/21 ?0116 11/26/21 ?7416 11/27/21 ?0320 11/28/21 ?0406  ?HGB 8.3* 7.9* 7.9* 7.7* 8.2* 7.4* 7.5* 8.4* 7.8* 8.0*  ?Likely from malignancy and Xofigo.  Anemia panel with folic acid deficiency and borderline B12 deficiency.  Oncology transfuses intermittently.  Transfused 1 unit with appropriate response but Hgb relatively stable.  LDH 187.  Haptoglobin elevated to 412 arguing against hemolysis. ?-Continue folic acid and vitamin B12 supplementation ?-Recheck CBC at follow-up ?

## 2021-11-26 NOTE — Assessment & Plan Note (Signed)
Continue warfarin per pharmacy. ?

## 2021-11-26 NOTE — Assessment & Plan Note (Addendum)
Could be due to metformin.  Seems to have resolved.  C. difficile and GIP negative.  Abdominal exam benign. ?

## 2021-11-26 NOTE — Assessment & Plan Note (Addendum)
HR as high as 130s on presentation but improved.  Currently, HR in 90s to 100.  Hypotension resolved.  INR therapeutic.  Tolerated metoprolol at 25 mg twice daily ?-Continue home amiodarone and warfarin ?-Increase metoprolol to 50 mg twice daily for 1 day followed by home dose (100 mg twice daily) ?-Recommended outpatient follow-up with his cardiologist in 1 to 2 weeks or sooner. ?

## 2021-11-26 NOTE — Assessment & Plan Note (Signed)
Nutrition Status: ?Nutrition Problem: Moderate Malnutrition ?Etiology: chronic illness, cancer and cancer related treatments ?Signs/Symptoms: mild fat depletion, mild muscle depletion, moderate muscle depletion ?Interventions: Boost Plus ?

## 2021-11-26 NOTE — Hospital Course (Addendum)
[name] with PMH of prostate cancer with osseous metastasis, urinary retention with chronic Foley catheter, paroxysmal A-fib and DVT on Coumadin, DM-2, HTN and anemia presenting with low blood glucose to 30s, fatigue and diarrhea for 2 days, and found to be in A-fib with RVR with HR to 130s and hypoglycemic to 45.  He was also anemic with hemoglobin down to 7.4 (baseline 8-9).  UA with pyuria and bacteria concerning for UTI.  CT abdomen and pelvis showed bilateral hydronephrosis and hydroureter without obstructing colliculi which seems to be chronic.   ? ?Patient was continued on home amiodarone and warfarin for atrial fibrillation with improvement in heart rate.  Slowly restarted home metoprolol as blood pressure allows.  He will be discharged on home amiodarone and warfarin.  We will start him home metoprolol at 50 mg twice daily for 1 day, then increase to home dose (100 mg twice daily).  Advised to follow-up with his cardiologist in 1 to 2 weeks or sooner if needed. ? ?Patient was started on IV ceftriaxone for catheter associated UTI.  Catheter replaced on 11/27/2020.  Urine culture with pansensitive Klebsiella pneumonia except to ampicillin.  He is discharged on p.o. cefadroxil to complete total of 6 days course.  Advised to hold home Synjardy until he completes antibiotic course. ? ?In regards to anemia, he was transfused 1 unit with appropriate response.  H&H remained stable.  Haptoglobin high arguing against hemolytic process.  Anemia panel consistent with anemia of chronic disease and folate deficiency and borderline vitamin B12.  He is discharged on folic acid and p.o. vitamin B12.  Recommend repeat CBC in 1 to 2 weeks ? ?In regards to NIDDM-2 and hypoglycemia, he received D50 amp with resolution of hypoglycemia.  Glimepiride discontinued indefinitely.  Advised to hold home Synjardy until he completes antibiotic course for UTI.  Patient to continue home Ozempic.  Patient's recent A1c was 6.9% last  month. ? ?Patient has significant edema which is multifactorial including fluid and sodium indiscretion, anemia, hypoalbuminemia, venous insufficiency, malignancy and possible underlying diastolic CHF with some degree of pulmonary hypertension.  He has no significant cardiopulmonary symptoms.  He received IV Lasix with IV albumin with excellent urine output.  He is discharged on p.o. Lasix.  He was counseled on sodium and fluid restrictions.  Reassess at follow-up. ? ?On the day of discharge, patient felt well except for back pain from lying in hospital bed, and he was eager to go home. ?

## 2021-11-26 NOTE — H&P (Signed)
?History and Physical  ? ? ?Patient: Tommy Yang YQM:[id number] DOB: [birth year] ?DOA: 11/25/2021 ?DOS: the patient was seen and examined on 11/26/2021 ?PCP: [name], MD  ?Patient coming from: Home ? ?Chief Complaint: No chief complaint on file. ? ?HPI: Tommy Yang is a 71 y.o. male with medical history significant of prostate cancer with metastasis to bone, urinary retention with chronic indwelling catheter, paroxysmal atrial fibrillation, DVT on Coumadin, anemia, hypertension, and type 2 diabetes who presents with concerns of hypoglycemia. ? ?He reports that for the past 2 days he has noticed his blood glucose in the 30s.  He is not on daily insulin and last took weekly injection of Ozempic about 4 days ago.  Reports that he continues to have good appetite but has been having diarrhea for the past 2 days.  Also feels fatigue. ? ?In the ED, he was noted to be in atrial fibrillation with RVR up to 130, with blood pressure with systolic of 90 and 528 on room air.  No leukocytosis, hemoglobin of 7.4 from a prior of 8.2.  Platelet showed clumping so was incalculable. ? ?Sodium of 138, K of 3.9, creatinine of 0.65, BG of 45.  Magnesium of 1.9. ?UA shows large leukocyte, negative nitrite many bacteria. ? ?CT of the abdomen/pelvis shows bilateral hydronephrosis and hydroureter but no obstructing calculi. ?Review of Systems: As mentioned in the history of present illness. All other systems reviewed and are negative. ?Past Medical History:  ?Diagnosis Date  ? Arthritis   ? Atrial fibrillation ([place])   ? Complication of anesthesia   ? needs to sit up  at 45 degree angle or gets afib during surgery or in recovery  ? Diabetes mellitus without complication ([place])   ? type 2  ? DVT (deep venous thrombosis) ([place]) 2015  ? left leg.behind knee  ? Foley catheter in place last changed 2eeks ago  ? last 7 months  ? GERD (gastroesophageal reflux disease)   ? Hx of radiation therapy 40 tx 2015  ? Hypercholesterolemia   ?  Hypertension   ? Prostate cancer ([place]) 12/06/13  ? gleason 4+3=7, 6/12 cores positive. seed implant and radiation  ? Tourette's syndrome   ? Urinary retention   ? Wears glasses   ? ?Past Surgical History:  ?Procedure Laterality Date  ? APPENDECTOMY  1968  ? BALLOON DILATION N/A 08/02/2015  ? Procedure: BALLOON DILATION;  Surgeon: [name], MD;  Location: [facility];  Service: Endoscopy;  Laterality: N/A;  ? CARDIOVERSION N/A 05/13/2018  ? Procedure: CARDIOVERSION;  Surgeon: Sanda Klein, MD;  Location: [facility];  Service: Cardiovascular;  Laterality: N/A;  ? CARDIOVERSION N/A 05/18/2018  ? Procedure: CARDIOVERSION;  Surgeon: Buford Dresser, MD;  Location: [facility];  Service: Cardiovascular;  Laterality: N/A;  ? COLONOSCOPY WITH PROPOFOL N/A 08/02/2015  ? Procedure: COLONOSCOPY WITH PROPOFOL w/ APC;  Surgeon: [name], MD;  Location: [facility];  Service: Endoscopy;  Laterality: N/A;  ? CYSTOSCOPY N/A 10/05/2020  ? Procedure: CYSTOSCOPY;  Surgeon: Alexis Frock, MD;  Location: [facility];  Service: Urology;  Laterality: N/A;  ? ESOPHAGOGASTRODUODENOSCOPY (EGD) WITH PROPOFOL N/A 08/02/2015  ? Procedure: ESOPHAGOGASTRODUODENOSCOPY (EGD) WITH PROPOFOL/ possible dilation.;  Surgeon: [name], MD;  Location: [facility];  Service: Endoscopy;  Laterality: N/A;  ? KNEE SURGERY  2001  ? right knee orthoscopic  ? PROSTATE BIOPSY  12/06/13  ? Gleason 4+3=7, volume 35 gm  ? [place]  ?  Tourette'ssyndrome spinal fluid removed   ? TONSILLECTOMY  as child  ? TRANSURETHRAL RESECTION OF PROSTATE N/A 09/30/2019  ? Procedure: TRANSURETHRAL RESECTION OF THE PROSTATE (TURP);  Surgeon: Alexis Frock, MD;  Location: [facility];  Service: Urology;  Laterality: N/A;  1 HR  ? TRANSURETHRAL RESECTION OF PROSTATE N/A 10/05/2020  ? Procedure: TRANSURETHRAL RESECTION OF THE PROSTATE (TURP);  Surgeon: Alexis Frock, MD;  Location: [facility];   Service: Urology;  Laterality: N/A;  1 HR  ? ?Social History:  reports that he quit smoking about 9 years ago. His smoking use included cigarettes. He has a 11.50 pack-year smoking history. He has never used smokeless tobacco. He reports that he does not currently use alcohol. He reports that he does not use drugs. ? ?Allergies  ?Allergen Reactions  ? Shingrix [Zoster Vac Recomb Adjuvanted] Shortness Of Breath and Other (See Comments)  ?  Pulse increased to 170+ and it affected the A-Fib  ? Other   ?  shingls shot caused atrial fib did not take 2nd shot  ? ? ?Family History  ?Problem Relation Age of Onset  ? Heart disease Mother   ?     before age 50  ? Diabetes Mother   ? Hyperlipidemia Mother   ? Varicose Veins Mother   ? Hypertension Mother   ? Heart disease Father   ? Diabetes Father   ? Hyperlipidemia Father   ? Hypertension Father   ? Cancer Brother   ? COPD Brother   ? Diabetes Brother   ? Hyperlipidemia Brother   ? Hypertension Brother   ? ? ?Prior to Admission medications   ?Medication Sig Start Date End Date Taking? Authorizing Provider  ?acetaminophen (TYLENOL) 500 MG tablet Take 500-1,000 mg by mouth every 6 (six) hours as needed (for pain).    [provider]  ?amiodarone (PACERONE) 200 MG tablet Take 1 tablet (200 mg total) by mouth daily. 11/21/21   Sherran Needs, NP  ?atorvastatin (LIPITOR) 80 MG tablet TAKE 1 TABLET BY MOUTH EVERY DAY 11/13/21   Elayne Snare, MD  ?blood glucose meter kit and supplies KIT Test blood sugar daily as directed. Dx code: E11.9 01/23/15   Darlyne Russian, MD  ?CALCIUM PO Take 1,000 mg by mouth daily.    [provider]  ?Cholecalciferol (VITAMIN D3) 10 MCG (400 UNIT) CAPS Take 400 Units by mouth daily.    [provider]  ?Cyanocobalamin (VITAMIN B 12 PO) Take 1,000 mcg by mouth daily.    [provider]  ?diphenoxylate-atropine (LOMOTIL) 2.5-0.025 MG tablet TAKE 1 TABLET BY MOUTH 4 (FOUR) TIMES DAILY AS NEEDED FOR DIARRHEA OR LOOSE  STOOLS. 08/02/21   [name], MD  ?Empagliflozin-metFORMIN HCl ER (SYNJARDY XR) 12.12-998 MG TB24 TAKE 2 TABLETS BY MOUTH EVERY DAY 04/29/21   Elayne Snare, MD  ?ERLEADA 60 MG tablet Take 240 mg by mouth daily. 01/30/21   [provider]  ?furosemide (LASIX) 40 MG tablet Take 1 tablet (40 mg total) by mouth daily. 09/12/21   Little Ishikawa, MD  ?glimepiride (AMARYL) 1 MG tablet TAKE 1 TABLET BY MOUTH EVERY DAY WITH BREAKFAST ?Patient not taking: Reported on 11/21/2021 11/18/21   Elayne Snare, MD  ?glucose blood (ACCU-CHEK GUIDE) test strip Use as instructed to check blood sugar twice daily. 10/29/18   Elayne Snare, MD  ?leuprolide (LUPRON) 11.25 MG injection Inject 11.25 mg into the muscle every 6 (six) months.    [provider]  ?  metoprolol tartrate (LOPRESSOR) 100 MG tablet Take 1 tablet (100 mg total) by mouth 2 (two) times daily. 11/18/21   Sherran Needs, NP  ?pantoprazole (PROTONIX) 40 MG tablet TAKE 1 TABLET BY MOUTH EVERY DAY 11/18/21   [name], MD  ?Semaglutide,0.25 or 0.5MG/DOS, (OZEMPIC, 0.25 OR 0.5 MG/DOSE,) 2 MG/1.5ML SOPN Inject 0.5 mg into the skin once a week. ?Patient taking differently: Inject 1 mg into the skin every Friday. 07/10/21   Elayne Snare, MD  ?tamsulosin (FLOMAX) 0.4 MG CAPS capsule Take 1 capsule (0.4 mg total) by mouth 2 (two) times daily. 09/24/21   Sherran Needs, NP  ?tolterodine (DETROL) 2 MG tablet SMARTSIG:1 Tablet(s) By Mouth 1-2 Times Daily 10/08/21   [provider]  ?warfarin (COUMADIN) 5 MG tablet Take 1 tablet (5 mg total) by mouth daily after breakfast. 09/12/21 11/21/21  Little Ishikawa, MD  ?zinc gluconate 50 MG tablet Take 50 mg by mouth daily.    [provider]  ? ? ?Physical Exam: ?Vitals:  ? 11/25/21 2345 11/26/21 0000 11/26/21 0030 11/26/21 0059  ?BP:  99/67 100/63 93/70  ?Pulse: (!) 120 (!) 105 95 (!) 114  ?Resp: 18 19 (!) 21 20  ?Temp:    99 ?F (37.2 ?C)  ?TempSrc:    Oral  ?SpO2: 97% 92% 93% 97%  ?Weight:      ?Height:       ?Constitutional: NAD, calm, comfortable, surprisingly well-appearing obese male sitting upright in bed ?Eyes: lids and conjunctivae normal ?ENMT: Mucous membranes are moist.  ?Neck: normal, supple ?Res

## 2021-11-26 NOTE — Assessment & Plan Note (Addendum)
HH PT/OT ordered. ?

## 2021-11-26 NOTE — Evaluation (Signed)
Physical Therapy Evaluation ?Patient Details ?Name: Tommy Yang ?MRN: [id number] ?DOB: [birth year] ?Today's Date: 11/26/2021 ? ?History of Present Illness ? 71 yo male admitted with Afib with RVR. Hx of prostate ca with mets, chronic indwelling catheter, Afib, DVT, DM, Tourette's Syndrome, hypotension 2* BP meds?, anemia  ?Clinical Impression ? On eval, pt required Min guard-Min A for mobility. He walked ~50 feet with a RW. HR up to 170 bpm with ambulation. Pt presents with general weakness, decreased activity tolerance, and impaired gait and balance. Pt reports having increased weakness and decreased functional mobility and independence over the last week or so. He is agreeable to HHPT f/u. Will continue to follow and progress activity as tolerated.    ?   ? ?Recommendations for follow up therapy are one component of a multi-disciplinary discharge planning process, led by the attending physician.  Recommendations may be updated based on patient status, additional functional criteria and insurance authorization. ? ?Follow Up Recommendations Home health PT ? ?  ?Assistance Recommended at Discharge PRN  ?Patient can return home with the following ? A little help with walking and/or transfers;A little help with bathing/dressing/bathroom;Assistance with cooking/housework;Assist for transportation;Help with stairs or ramp for entrance ? ?  ?Equipment Recommendations None recommended by PT  ?Recommendations for Other Services ?    ?  ?Functional Status Assessment Patient has had a recent decline in their functional status and demonstrates the ability to make significant improvements in function in a reasonable and predictable amount of time.  ? ?  ?Precautions / Restrictions Precautions ?Precautions: Fall ?Precaution Comments: monitor HR ?Restrictions ?Weight Bearing Restrictions: No  ? ?  ? ?Mobility ? Bed Mobility ?Overal bed mobility: Needs Assistance ?Bed Mobility: Supine to Sit, Sit to Supine ?  ?  ?Supine to sit:  Min guard, HOB elevated ?Sit to supine: Min assist, HOB elevated ?  ?General bed mobility comments: Small amount of assistance for LEs back onto bed. Increased time. Pt relies on bedrail ?  ? ?Transfers ?Overall transfer level: Needs assistance ?Equipment used: Rolling walker (2 wheels) ?Transfers: Sit to/from Stand ?Sit to Stand: Min guard, From elevated surface ?  ?  ?  ?  ?  ?General transfer comment: Min guard for safety. ?  ? ?Ambulation/Gait ?Ambulation/Gait assistance: Min guard ?Gait Distance (Feet): 50 Feet ?Assistive device: Rolling walker (2 wheels) ?Gait Pattern/deviations: Step-through pattern, Decreased stride length ?  ?  ?  ?General Gait Details: Min guard for safety. HR up to 170 bpm after ambulation. Pt denied dizziness and dyspnea. ? ?Stairs ?  ?  ?  ?  ?  ? ?Wheelchair Mobility ?  ? ?Modified Rankin (Stroke Patients Only) ?  ? ?  ? ?Balance Overall balance assessment: Needs assistance ?  ?  ?  ?  ?Standing balance support: Bilateral upper extremity supported, During functional activity, Reliant on assistive device for balance ?Standing balance-Leahy Scale: Poor ?  ?  ?  ?  ?  ?  ?  ?  ?  ?  ?  ?  ?   ? ? ? ?Pertinent Vitals/Pain Pain Assessment ?Pain Assessment: 0-10 ?Pain Score: 7  ?Pain Location: back ?Pain Descriptors / Indicators: Discomfort, Sore ?Pain Intervention(s): Limited activity within patient's tolerance, Monitored during session, Repositioned  ? ? ?Home Living Family/patient expects to be discharged to:: Private residence ?Living Arrangements: Spouse/significant other ?Available Help at Discharge: Family ?Type of Home: House ?Home Access: Stairs to enter ?Entrance Stairs-Rails:  ((no rails-has post)) ?Entrance Stairs-Number of Steps: 2 +  landing ?  ?Home Layout: One level ?Home Equipment: BSC/3in1;Rolling Walker (2 wheels);Wheelchair - manual;Cane - single point ?   ?  ?Prior Function Prior Level of Function : Needs assist ?  ?  ?  ?  ?  ?  ?Mobility Comments: using RW vs WC. having  much more difficulty as of late. has had to call 911 when he has fallen at home ?ADLs Comments: wife stands-by when getting in/out of shower. ?  ? ? ?Hand Dominance  ?   ? ?  ?Extremity/Trunk Assessment  ? Upper Extremity Assessment ?Upper Extremity Assessment: Defer to OT evaluation ?  ? ?Lower Extremity Assessment ?Lower Extremity Assessment: Generalized weakness ?  ? ?Cervical / Trunk Assessment ?Cervical / Trunk Assessment: Normal  ?Communication  ? Communication: No difficulties  ?Cognition Arousal/Alertness: Awake/alert ?Behavior During Therapy: [facility] for tasks assessed/performed ?Overall Cognitive Status: Within Functional Limits for tasks assessed ?  ?  ?  ?  ?  ?  ?  ?  ?  ?  ?  ?  ?  ?  ?  ?  ?  ?  ?  ? ?  ?General Comments   ? ?  ?Exercises    ? ?Assessment/Plan  ?  ?PT Assessment Patient needs continued PT services  ?PT Problem List Decreased strength;Decreased mobility;Decreased activity tolerance;Decreased balance;Decreased knowledge of use of DME;Pain ? ?   ?  ?PT Treatment Interventions DME instruction;Gait training;Therapeutic exercise;Balance training;Functional mobility training;Therapeutic activities;Patient/family education;Stair training   ? ?PT Goals (Current goals can be found in the Care Plan section)  ?Acute Rehab PT Goals ?Patient Stated Goal: to get strength back ?PT Goal Formulation: With patient ?Time For Goal Achievement: 12/10/21 ?Potential to Achieve Goals: Fair ? ?  ?Frequency Min 3X/week ?  ? ? ?Co-evaluation   ?  ?  ?  ?  ? ? ?  ?AM-PAC PT "6 Clicks" Mobility  ?Outcome Measure Help needed turning from your back to your side while in a flat bed without using bedrails?: A Little ?Help needed moving from lying on your back to sitting on the side of a flat bed without using bedrails?: A Little ?Help needed moving to and from a bed to a chair (including a wheelchair)?: A Little ?Help needed standing up from a chair using your arms (e.g., wheelchair or bedside chair)?: A Little ?Help  needed to walk in hospital room?: A Little ?Help needed climbing 3-5 steps with a railing? : A Little ?6 Click Score: 18 ? ?  ?End of Session Equipment Utilized During Treatment: Gait belt ?Activity Tolerance: Patient tolerated treatment well ?Patient left: in bed;with call bell/phone within reach;with bed alarm set ?  ?PT Visit Diagnosis: Muscle weakness (generalized) (M62.81);Difficulty in walking, not elsewhere classified (R26.2);Pain;History of falling (Z91.81) ?Pain - part of body:  (back) ?  ? ?Time: 3716-9678 ?PT Time Calculation (min) (ACUTE ONLY): 26 min ? ? ?Charges:   PT Evaluation ?$PT Eval Moderate Complexity: 1 Mod ?PT Treatments ?$Gait Training: 8-22 mins ?  ?   ? ? ? ? ?Doreatha Massed, PT ?Acute Rehabilitation  ?Office: [phone] ?Pager: [phone] ? ? ? ?

## 2021-11-26 NOTE — Assessment & Plan Note (Signed)
Body mass index is 31.66 kg/m?.  ?-See malnutrition ?

## 2021-11-26 NOTE — Progress Notes (Signed)
Initial Nutrition Assessment ? ?DOCUMENTATION CODES:  ? ?Non-severe (moderate) malnutrition in context of chronic illness ? ?INTERVENTION: ?- will d/c Ensure per patient preference. ?- will order Will order Boost Plus BID, each supplement provides 360 kcal and 14 grams of protein. ? ? ?NUTRITION DIAGNOSIS:  ? ?Moderate Malnutrition related to chronic illness, cancer and cancer related treatments as evidenced by mild fat depletion, mild muscle depletion, moderate muscle depletion. ? ?GOAL:  ? ?Patient will meet greater than or equal to 90% of their needs ? ?MONITOR:  ? ?PO intake, Supplement acceptance, Labs, Weight trends, I & O's ? ?REASON FOR ASSESSMENT:  ? ?Malnutrition Screening Tool ? ?ASSESSMENT:  ? ?71 y.o. male with medical history of prostate cancer with metastasis to bone, urinary retention with chronic indwelling catheter, afib, DVT on Coumadin, anemia, HTN, and type 2 DM. He presented to the ED with concerns of hypoglycemia and diarrhea x2 days and was admitted for afib with RVR and hypoglycemia. In the ED, CT abdomen/pelvis showed bilateral hydronephrosis and hydroureter without obstruction. ? ?Patient laying in bed with his wife at bedside. Patient and wife had a virtual appointment with one of the RDs at the [facility] on 11/04/21. Note from that encounter reviewed by this RD. ? ?Patient and wife report that appetite has been improving since that time and that he has been eating "like a horse", mainly focusing on protein. He consumes 1-2 bottles of chocolate Boost/day, 2-3 cups of yogurt/day, and other things such as [employer] chicken and stars soup. He does eat minimal high carb foods such as breads and pastas or tougher to chew meats. He reports that bland, cardboard-like taste is persistent and mainly occurs with high carb foods.  ? ?He reports extreme weakness and fatigue. He does have a 4-pronged walker and a wheelchair at home if he needs. He denies any dizziness or lightheadedness with  standing or ambulating.  ? ?Patient reports that BLE edema is newer. He shares that a provider informed him this is d/t to low serum protein. Patient is concerned about absorption of the protein he is consuming. He and wife ask about protein powder and this was discussed. We also discussed blood sugar impact based on eating a lower carb diet.  ? ?Weight yesterday was 227 lb and was likely impacted by fluid. Weight on 07/10/21 was 258 lb which indicates 31 lb weight loss (12% body weight) in the past 4 months; significant for time frame even with weight yesterday being impacted by fluid.  ? ? ?Labs reviewed; CBGs: 63-118 mg/dl, TIBC: 170 (reference range: 250-450), ferritin: 983 (reference range: 24-336), folate: 2.5 (reference range: >5.9), cortisol: 24 (reference range: 6.7-22.6) ? ?Medications reviewed; 2 g IV Mg x1 run 3/27, 12.5 g albumin x1 dose 3/28. ?  ? ?NUTRITION - FOCUSED PHYSICAL EXAM: ? ?Flowsheet Row Most Recent Value  ?Orbital Region Mild depletion  ?Upper Arm Region Moderate depletion  ?Thoracic and Lumbar Region Unable to assess  ?Buccal Region Mild depletion  ?Temple Region Mild depletion  ?Clavicle Bone Region Moderate depletion  ?Clavicle and Acromion Bone Region Moderate depletion  ?Scapular Bone Region Mild depletion  ?Dorsal Hand No depletion  ?Patellar Region No depletion  [likely impacted by degree of edema]  ?Anterior Thigh Region No depletion  [likely impacted by degree of edema]  ?Posterior Calf Region No depletion  [likely impacted by degree of edema]  ?Edema (RD Assessment) Severe  [BLE, up past knees]  ?Hair Reviewed  ?Eyes Reviewed  ?Mouth Reviewed  ?Skin  Reviewed  ?Nails Reviewed  ? ?  ? ? ?Diet Order:   ?Diet Order   ? ?       ?  Diet regular Room service appropriate? Yes; Fluid consistency: Thin  Diet effective now       ?  ? ?  ?  ? ?  ? ? ?EDUCATION NEEDS:  ? ?Education needs have been addressed ? ?Skin:  Skin Assessment: Reviewed RN Assessment ? ?Last BM:  3/28 (type 6 x1, small  amount) ? ?Height:  ? ?Ht Readings from Last 1 Encounters:  ?11/25/21 '5\' 11"'$  (1.803 m)  ? ? ?Weight:  ? ?Wt Readings from Last 1 Encounters:  ?11/25/21 103 kg  ? ? ? ?BMI:  Body mass index is 31.66 kg/m?. ? ?Estimated Nutritional Needs:  ?Kcal:  2200-2400 kcal ?Protein:  115-130 grams ?Fluid:  >/= 2 L/day ? ? ? ? ?Jarome Matin, MS, RD, LDN ?Registered Dietitian II ?Inpatient Clinical Nutrition ?RD pager # and on-call/weekend pager # available in [place]  ? ?

## 2021-11-26 NOTE — Progress Notes (Addendum)
ANTICOAGULATION CONSULT NOTE ? ?Pharmacy Consult for Warfarin ?Indication: atrial fibrillation and h/o DVT ? ?Allergies  ?Allergen Reactions  ? Shingrix [Zoster Vac Recomb Adjuvanted] Shortness Of Breath and Other (See Comments)  ?  Pulse increased to 170+ and it affected the A-Fib  ? ? ?Patient Measurements: ?Height: '5\' 11"'$  (180.3 cm) ?Weight: 103 kg (227 lb) ?IBW/kg (Calculated) : 75.3 ?  ? ?Vital Signs: ?Temp: 97.4 ?F (36.3 ?C) (03/28 7062) ?Temp Source: Oral (03/28 3762) ?BP: 98/62 (03/28 0622) ?Pulse Rate: 101 (03/28 0622) ? ?Labs: ?Recent Labs  ?  11/25/21 ?8315 11/26/21 ?0116 11/26/21 ?0744  ?HGB 7.4* 7.5* 8.4*  ?HCT 24.1* 25.2* 27.9*  ?PLT PLATELET CLUMPS NOTED ON SMEAR, UNABLE TO ESTIMATE 114*  --   ?APTT 66*  --   --   ?LABPROT 28.6* 28.2*  --   ?INR 2.7* 2.6*  --   ?CREATININE 0.65 0.81  --   ? ? ? ?Estimated Creatinine Clearance: 105.2 mL/min (by C-G formula based on SCr of 0.81 mg/dL). ? ? ?Medical History: ?Past Medical History:  ?Diagnosis Date  ? Arthritis   ? Atrial fibrillation ([place])   ? Complication of anesthesia   ? needs to sit up  at 45 degree angle or gets afib during surgery or in recovery  ? Diabetes mellitus without complication ([place])   ? type 2  ? DVT (deep venous thrombosis) ([place]) 2015  ? left leg.behind knee  ? Foley catheter in place last changed 2eeks ago  ? last 7 months  ? GERD (gastroesophageal reflux disease)   ? Hx of radiation therapy 40 tx 2015  ? Hypercholesterolemia   ? Hypertension   ? Prostate cancer ([place]) 12/06/13  ? gleason 4+3=7, 6/12 cores positive. seed implant and radiation  ? Tourette's syndrome   ? Urinary retention   ? Wears glasses   ? ? ?Medications:  ?Warfarin PTA - patient reports taking '5mg'$  daily except 7.'5mg'$  on Mon/Fri per med hx with last dose 3/27 at 1000 (this was dose per 2/27 anti-coag cards visit note); however, per last anti-coag outpatient visit with cardiology on 3/23, reported dose was to be '5mg'$  daily except 2.'5mg'$  every Wed. Dose has been steadily  decreased over the last month per cards notes due to supratherapeutic INRs ? ?Assessment: ?71 yr male with hypoglycemia and AFib.  PMH significant for HTN, DVT, HLD, AFib, DM, prostate cancer, indwelling foley. INR = 2.7 (therapeutic) on admission ? ?INR therapeutic ?CBC: Hgb 8.4 getting blood transfusion, Plts 114 ?NO reported bleeding per notes ? ? ?Goal of Therapy:  ?INR 2-3 ?Monitor platelets by anticoagulation protocol: Yes ?  ?Plan:  ?Continue with patient's home warfarin regimen as above - '5mg'$  today ?Daily PT/INR ? ?Kara Mead, PharmD ?11/26/2021,8:26 AM ? ? ?

## 2021-11-26 NOTE — Assessment & Plan Note (Addendum)
Multifactorial including medications (Lasix and metoprolol) or hypogonadism from [facility] for his prostate cancer and urinary tract infection. A.m. cortisol appropriately elevated.  TSH within normal.  Hypotension resolved. ?

## 2021-11-26 NOTE — Assessment & Plan Note (Addendum)
Resolved

## 2021-11-26 NOTE — Assessment & Plan Note (Addendum)
Patient with chronic Foley due to chronic urinary retention with chronic hydronephrosis.  UA with pyuria and bacteria.  Urine culture with pansensitive Klebsiella pneumonia except to ampicillin.  Blood cultures NGTD. ?-Received IV ceftriaxone for 3 days and discharged on p.o. cefadroxil 1 g twice daily for 3 more days. ?-Foley exchanged on 11/27/2021 ?

## 2021-11-26 NOTE — Progress Notes (Signed)
?PROGRESS NOTE ? ?ANIS DEGIDIO ZOX:[id number] DOB: 1951-04-13  ? ?PCP: [name], MD ? ?Patient is from: Home.  Lives with wife.  Uses rolling walker at baseline. ? ?DOA: 11/25/2021 LOS: 1 ? ?Chief complaints ?No chief complaint on file. ?  ? ?Brief Narrative / Interim history: ?[name] with PMH of prostate cancer with osseous metastasis, urinary retention with chronic Foley catheter, paroxysmal A-fib and DVT on Coumadin, DM-2, HTN and anemia presenting with low blood glucose to 30s, fatigue and diarrhea for 2 days, and found to be in A-fib with RVR with HR to 130s and hypoglycemic to 45.  He was also anemic with hemoglobin down to 7.4 (baseline 8-9).  UA with pyuria and bacteria concerning for UTI.  CT abdomen and pelvis showed bilateral hydronephrosis and hydroureter without obstructing colliculi which seems to be chronic.  Patient was continued on home amiodarone and warfarin for atrial fibrillation with improvement in heart rate.  He was transfused 1 unit.  Received D50 amp with improvement in hypoglycemia.  ? ?Subjective: ?Seen and examined earlier this morning.  No major events overnight of this morning.  Reports some improvement in his fatigue and diarrhea..  He denies chest pain, dyspnea, dizziness, palpitation, nausea or vomiting. ? ?Objective: ?Vitals:  ? 11/26/21 0526 11/26/21 0622 11/26/21 0947 11/26/21 1330  ?BP: 103/72 98/62 101/71 112/88  ?Pulse: (!) 102 (!) 101 98 100  ?Resp: 20  20   ?Temp: 98.1 ?F (36.7 ?C) (!) 97.4 ?F (36.3 ?C) (!) 97.4 ?F (36.3 ?C) 98 ?F (36.7 ?C)  ?TempSrc: Oral Oral Oral Oral  ?SpO2: 96% 97% 96% 96%  ?Weight:      ?Height:      ? ? ?Examination: ? ?GENERAL: No apparent distress.  Nontoxic. ?HEENT: MMM.  Vision and hearing grossly intact.  ?NECK: Supple.  No apparent JVD.  ?RESP:  No IWOB.  Fair aeration bilaterally.  Crackles at left lung base ?CVS:  RRR. Heart sounds normal.  ?ABD/GI/GU: BS+. Abd soft, NTND.  Indwelling Foley catheter ?MSK/EXT:  Moves  extremities. No apparent deformity.  Trace BLE edema ?SKIN: Skin changes in keeping with some chronic venous insufficiency in legs ?NEURO: Awake, alert and oriented appropriately.  No apparent focal neuro deficit. ?PSYCH: Calm. Normal affect.  ? ?Procedures:  ?None ? ?Microbiology summarized: ?COVID-19 and influenza PCR nonreactive. ?Blood cultures NGTD. ?C. difficile PCR negative. ?GI panel pending. ? ?Assessment and Plan: ?* Atrial fibrillation with RVR ([place]) ?HR as high as 130s on presentation but improved.  Currently, HR in 90s to 100.  He was hypotensive overnight and earlier this morning and metoprolol was held.  He is followed by cardiology.  Heart rate control has been a challenge due to hypotension in the past. ?-Continue home amiodarone ?-Resume home metoprolol at low-dose-add holding parameters ?-Continue home warfarin for anticoagulation.   ?-Monitor anemia and hypoglycemia as below ?-Optimize electrolytes ? ?Uncontrolled type 2 diabetes mellitus with hypoglycemia, without long-term current use of insulin ([place]) ?Likely iatrogenic from glimepiride and other hypoglycemic agents in the setting of poor p.o. intake and diarrhea.  Seems to be resolving.  Required D50 amp x1 so far. ?-Continue CBG monitoring ?-Liberate diet ?-D50 as needed ?-Recommend discontinuing glimepiride on discharge ? ?Hypotension ?Multifactorial including medications (Lasix and metoprolol) or hypogonadism from [facility] for his prostate cancer.  UA concerning for UTI.  A.m. cortisol appropriately elevated.  TSH within normal.  Hypotension seems to have resolved. ?-Resume home metoprolol at reduced dose ?-Treat possible UTI as below ? ?  Catheter-associated urinary tract infection ([place]) ?Patient with chronic Foley due to chronic urinary retention with chronic hydronephrosis.  UA with pyuria and bacteria concerning for UTI.  Blood cultures NGTD. ?-Continue IV ceftriaxone pending urine cultures ?-Needs foley exchange ? ?Lactic acidosis ?Due to  infection/UTI?  He is also on metformin at home. ?-Continue IV antibiotics ?-Recheck ? ?Anemia of chronic disease ?Recent Labs  ?  09/10/21 ?0160 09/11/21 ?0421 09/24/21 ?1093 10/11/21 ?2355 10/21/21 ?7322 10/24/21 ?0900 10/25/21 ?1102 11/25/21 ?0254 11/26/21 ?0116 11/26/21 ?0744  ?HGB 8.2* 8.8* 8.3* 7.9* 7.9* 7.7* 8.2* 7.4* 7.5* 8.4*  ?Likely from malignancy and Xofigo.  Anemia panel with folic acid deficiency and borderline B12 deficiency.  Oncology transfuse as intermittently.  Transfused 1 unit with appropriate response seen ?-Monitor H&H.  Check LDH and haptoglobin ?-Start folic acid ?-Injectable Vitamin B12 1000 mcg x 1 followed by p.o. ?-Transfuse as appropriate. ? ?Obesity (BMI 30-39.9) ?Body mass index is 31.66 kg/m?.  ?-See malnutrition ? ?Generalized weakness ?PT/OT eval ? ?Malnutrition of moderate degree ?Nutrition Status: ?Nutrition Problem: Moderate Malnutrition ?Etiology: chronic illness, cancer and cancer related treatments ?Signs/Symptoms: mild fat depletion, mild muscle depletion, moderate muscle depletion ?Interventions: Boost Plus ? ?Hypomagnesemia ?Magnesium of 1.4.  Repleted with IV mag in ED. ?-Recheck in the morning ? ?Diarrhea ?Seems to have resolved.  C. difficile panel negative.  Abdominal exam benign. ?-Follow GIP ? ?Malignant neoplasm of prostate metastatic to bone [facility]) ?Follows with oncology Dr. Alen Blew. On androgen deprivation therapy.   ?-Continue home Erleada ? ?Chronic urinary retention with chronic bilateral hydronephrosis ?Likely due to prostate cancer.  Stable. ?-Continue indwelling Foley catheter ? ?History of DVT (deep vein thrombosis) ?Continue warfarin per pharmacy. ? ? ?  ?DVT prophylaxis:  ?  Patient is on warfarin. ? ?Code Status: Full code ?Family Communication: Patient and/or RN. Available if any question.  ?Level of care: Telemetry ?Status is: Inpatient ?Remains inpatient appropriate because: Due to hypoglycemia, A-fib with RVR, possible UTI and hypotension ? ? ?Final  disposition: Likely home in the next 24 to 48 hours ? ?Consultants:  ?None ? ?Sch Meds:  ?Scheduled Meds: ? amiodarone  200 mg Oral Daily  ? cyanocobalamin  1,000 mcg Intramuscular Once  ? folic acid  1 mg Oral Daily  ? lactose free nutrition  237 mL Oral BID BM  ? metoprolol tartrate  12.5 mg Oral BID  ? vitamin B-12  1,000 mcg Oral Daily  ? Warfarin - Pharmacist Dosing Inpatient   Does not apply [id number]  ? ?Continuous Infusions: ? cefTRIAXone (ROCEPHIN)  IV    ? ?PRN Meds:.dextrose ? ?Antimicrobials: ?Anti-infectives (From admission, onward)  ? ? Start     Dose/Rate Route Frequency Ordered Stop  ? 11/26/21 2300  cefTRIAXone (ROCEPHIN) 1 g in sodium chloride 0.9 % 100 mL IVPB       ? 1 g ?200 mL/hr over 30 Minutes Intravenous Every 24 hours 11/25/21 2350    ? 11/25/21 2145  cefTRIAXone (ROCEPHIN) 1 g in sodium chloride 0.9 % 100 mL IVPB       ? 1 g ?200 mL/hr over 30 Minutes Intravenous  Once 11/25/21 2141 11/26/21 0008  ? ?  ? ? ? ?I have personally reviewed the following labs and images: ?CBC: ?Recent Labs  ?Lab 11/25/21 ?3762 11/26/21 ?0116 11/26/21 ?0744  ?WBC 6.1 6.2  --   ?NEUTROABS 5.2  --   --   ?HGB 7.4* 7.5* 8.4*  ?HCT 24.1* 25.2* 27.9*  ?MCV 92.7 94.7  --   ?  PLT PLATELET CLUMPS NOTED ON SMEAR, UNABLE TO ESTIMATE 114*  --   ? ?BMP &GFR ?Recent Labs  ?Lab 11/25/21 ?1610 11/26/21 ?0116  ?NA 138 138  ?K 3.9 4.1  ?CL 111 110  ?CO2 21* 21*  ?GLUCOSE 45* 58*  ?BUN 20 18  ?CREATININE 0.65 0.81  ?CALCIUM 8.8* 8.8*  ?MG 1.4*  --   ? ?Estimated Creatinine Clearance: 105.2 mL/min (by C-G formula based on SCr of 0.81 mg/dL). ?Liver & Pancreas: ?Recent Labs  ?Lab 11/25/21 ?1917  ?AST 17  ?ALT 22  ?ALKPHOS 79  ?BILITOT <0.1*  ?PROT 5.8*  ?ALBUMIN 1.7*  ? ?No results for input(s): LIPASE, AMYLASE in the last 168 hours. ?No results for input(s): AMMONIA in the last 168 hours. ?Diabetic: ?No results for input(s): HGBA1C in the last 72 hours. ?Recent Labs  ?Lab 11/26/21 ?0413 11/26/21 ?9604 11/26/21 ?0840 11/26/21 ?1148  11/26/21 ?1700  ?GLUCAP 63* 71 88 118* 161*  ? ?Cardiac Enzymes: ?No results for input(s): CKTOTAL, CKMB, CKMBINDEX, TROPONINI in the last 168 hours. ?No results for input(s): PROBNP in the last 8760 hours. ?Coagula

## 2021-11-26 NOTE — Assessment & Plan Note (Deleted)
Magnesium of 1.4.  Repleted with IV mag in ED. ?-Recheck in the morning ?

## 2021-11-26 NOTE — Assessment & Plan Note (Addendum)
Follows with oncology Dr. Alen Blew. On androgen deprivation therapy.   ?-Continue home Erleada ?

## 2021-11-26 NOTE — Assessment & Plan Note (Addendum)
Last A1c 6.9%.  Hypoglycemia resolved.  Discontinue glimepiride.  Patient to hold home Synjardy for 3 days until he completes antibiotic course for UTI.  Patient to continue Ozempic. ? ?

## 2021-11-26 NOTE — Assessment & Plan Note (Signed)
Likely due to prostate cancer.  Stable. ?-Continue indwelling Foley catheter ?

## 2021-11-26 NOTE — Progress Notes (Signed)
ANTICOAGULATION CONSULT NOTE - Initial Consult ? ?Pharmacy Consult for Warfarin ?Indication: atrial fibrillation and h/o DVT ? ?Allergies  ?Allergen Reactions  ? Shingrix [Zoster Vac Recomb Adjuvanted] Shortness Of Breath and Other (See Comments)  ?  Pulse increased to 170+ and it affected the A-Fib  ? Other   ?  shingls shot caused atrial fib did not take 2nd shot  ? ? ?Patient Measurements: ?Height: '5\' 11"'$  (180.3 cm) ?Weight: 103 kg (227 lb) ?IBW/kg (Calculated) : 75.3 ?  ? ?Vital Signs: ?Temp: 98.5 ?F (36.9 ?C) (03/27 1857) ?Temp Source: Oral (03/27 1857) ?BP: 99/67 (03/28 0000) ?Pulse Rate: 105 (03/28 0000) ? ?Labs: ?Recent Labs  ?  11/25/21 ?1917  ?HGB 7.4*  ?HCT 24.1*  ?PLT PLATELET CLUMPS NOTED ON SMEAR, UNABLE TO ESTIMATE  ?APTT 66*  ?LABPROT 28.6*  ?INR 2.7*  ?CREATININE 0.65  ? ? ?Estimated Creatinine Clearance: 106.5 mL/min (by C-G formula based on SCr of 0.65 mg/dL). ? ? ?Medical History: ?Past Medical History:  ?Diagnosis Date  ? Arthritis   ? Atrial fibrillation ([place])   ? Complication of anesthesia   ? needs to sit up  at 45 degree angle or gets afib during surgery or in recovery  ? Diabetes mellitus without complication ([place])   ? type 2  ? DVT (deep venous thrombosis) ([place]) 2015  ? left leg.behind knee  ? Foley catheter in place last changed 2eeks ago  ? last 7 months  ? GERD (gastroesophageal reflux disease)   ? Hx of radiation therapy 40 tx 2015  ? Hypercholesterolemia   ? Hypertension   ? Prostate cancer ([place]) 12/06/13  ? gleason 4+3=7, 6/12 cores positive. seed implant and radiation  ? Tourette's syndrome   ? Urinary retention   ? Wears glasses   ? ? ?Medications:  ?Warfarin PTA (most recent prescription is for '5mg'$  daily) ? ?Assessment: ?71 yr male with hypoglycemia and AFib.  PMH significant for HTN, DVT, HLD, AFib, DM, prostate cancer, indwelling foley. ?Patient reports taking last warfarin dose (strength unknown by patient) on 11/25/21 ~ 10AM ?INR = 2.7 (therapeutic) on admission ? ?Goal of  Therapy:  ?INR 2-3 ?Monitor platelets by anticoagulation protocol: Yes ?  ?Plan:  ?No further warfarin tonight ?F/U patient's home warfarin regimen 3/28 AM when med rec can be completed ?Daily PT/INR ? ?Keyon Winnick, Toribio Harbour, PharmD ?11/26/2021,12:19 AM ? ? ?

## 2021-11-26 NOTE — Plan of Care (Signed)
  Problem: Activity: Goal: Risk for activity intolerance will decrease Outcome: Progressing   Problem: Nutrition: Goal: Adequate nutrition will be maintained Outcome: Progressing   Problem: Coping: Goal: Level of anxiety will decrease Outcome: Progressing   

## 2021-11-27 DIAGNOSIS — I4891 Unspecified atrial fibrillation: Secondary | ICD-10-CM | POA: Diagnosis not present

## 2021-11-27 DIAGNOSIS — E878 Other disorders of electrolyte and fluid balance, not elsewhere classified: Secondary | ICD-10-CM

## 2021-11-27 DIAGNOSIS — R600 Localized edema: Secondary | ICD-10-CM

## 2021-11-27 DIAGNOSIS — E8809 Other disorders of plasma-protein metabolism, not elsewhere classified: Secondary | ICD-10-CM

## 2021-11-27 DIAGNOSIS — T83511A Infection and inflammatory reaction due to indwelling urethral catheter, initial encounter: Secondary | ICD-10-CM | POA: Diagnosis not present

## 2021-11-27 DIAGNOSIS — R0602 Shortness of breath: Secondary | ICD-10-CM

## 2021-11-27 DIAGNOSIS — E872 Acidosis, unspecified: Secondary | ICD-10-CM | POA: Diagnosis not present

## 2021-11-27 DIAGNOSIS — I9589 Other hypotension: Secondary | ICD-10-CM | POA: Diagnosis not present

## 2021-11-27 LAB — GASTROINTESTINAL PANEL BY PCR, STOOL (REPLACES STOOL CULTURE)

## 2021-11-27 LAB — RENAL FUNCTION PANEL
Albumin: 1.8 g/dL — ABNORMAL LOW (ref 3.5–5.0)
Anion gap: 3 — ABNORMAL LOW (ref 5–15)
BUN: 22 mg/dL (ref 8–23)
CO2: 25 mmol/L (ref 22–32)
Calcium: 9 mg/dL (ref 8.9–10.3)
Chloride: 112 mmol/L — ABNORMAL HIGH (ref 98–111)
Creatinine, Ser: 0.81 mg/dL (ref 0.61–1.24)
GFR, Estimated: >60 mL/min (ref >60)
Glucose, Bld: 114 mg/dL — ABNORMAL HIGH (ref 70–99)
Phosphorus: 1.5 mg/dL — ABNORMAL LOW (ref 2.5–4.6)
Potassium: 4.2 mmol/L (ref 3.5–5.1)
Sodium: 140 mmol/L (ref 135–145)

## 2021-11-27 LAB — PROTIME-INR
INR: 2.6 — ABNORMAL HIGH (ref 0.8–1.2)
Prothrombin Time: 27.7 seconds — ABNORMAL HIGH (ref 11.4–15.2)

## 2021-11-27 LAB — BPAM RBC
Blood Product Expiration Date: 202304192359
ISSUE DATE / TIME: 202303280148
Unit Type and Rh: 600

## 2021-11-27 LAB — MAGNESIUM: Magnesium: 1.7 mg/dL (ref 1.7–2.4)

## 2021-11-27 LAB — HAPTOGLOBIN: Haptoglobin: 412 mg/dL — ABNORMAL HIGH (ref 32–363)

## 2021-11-27 LAB — CBC
HCT: 26.4 % — ABNORMAL LOW (ref 39.0–52.0)
Hemoglobin: 7.8 g/dL — ABNORMAL LOW (ref 13.0–17.0)
MCH: 27.6 pg (ref 26.0–34.0)
MCHC: 29.5 g/dL — ABNORMAL LOW (ref 30.0–36.0)
MCV: 93.3 fL (ref 80.0–100.0)
Platelets: 107 10*3/uL — ABNORMAL LOW (ref 150–400)
RBC: 2.83 MIL/uL — ABNORMAL LOW (ref 4.22–5.81)
RDW: 21.8 % — ABNORMAL HIGH (ref 11.5–15.5)
WBC: 5.1 10*3/uL (ref 4.0–10.5)
nRBC: 0 % (ref 0.0–0.2)

## 2021-11-27 LAB — TYPE AND SCREEN
ABO/RH(D): A NEG
Antibody Screen: NEGATIVE
Unit division: 0

## 2021-11-27 LAB — GLUCOSE, CAPILLARY
Glucose-Capillary: 107 mg/dL — ABNORMAL HIGH (ref 70–99)
Glucose-Capillary: 114 mg/dL — ABNORMAL HIGH (ref 70–99)
Glucose-Capillary: 123 mg/dL — ABNORMAL HIGH (ref 70–99)
Glucose-Capillary: 168 mg/dL — ABNORMAL HIGH (ref 70–99)
Glucose-Capillary: 195 mg/dL — ABNORMAL HIGH (ref 70–99)
Glucose-Capillary: 206 mg/dL — ABNORMAL HIGH (ref 70–99)
Glucose-Capillary: 253 mg/dL — ABNORMAL HIGH (ref 70–99)
Glucose-Capillary: 97 mg/dL (ref 70–99)

## 2021-11-27 MED ORDER — METOPROLOL TARTRATE 25 MG PO TABS
25.0000 mg | ORAL_TABLET | Freq: Two times a day (BID) | ORAL | Status: DC
Start: 2021-11-27 — End: 2021-11-28
  Administered 2021-11-27 – 2021-11-28 (×3): 25 mg via ORAL
  Filled 2021-11-27 (×3): qty 1

## 2021-11-27 MED ORDER — MAGNESIUM SULFATE 2 GM/50ML IV SOLN
2.0000 g | Freq: Once | INTRAVENOUS | Status: AC
Start: 2021-11-27 — End: 2021-11-27
  Administered 2021-11-27: 2 g via INTRAVENOUS
  Filled 2021-11-27: qty 50

## 2021-11-27 MED ORDER — INSULIN ASPART 100 UNIT/ML IJ SOLN: 0.0000 [IU] | Freq: Three times a day (TID) | INTRAMUSCULAR | Status: DC

## 2021-11-27 MED ORDER — WARFARIN SODIUM 2.5 MG PO TABS
2.5000 mg | ORAL_TABLET | Freq: Once | ORAL | Status: AC
  Administered 2021-11-27: 2.5 mg via ORAL
  Filled 2021-11-27: qty 1

## 2021-11-27 NOTE — Progress Notes (Signed)
?PROGRESS NOTE ? ?Tommy Yang QMV:[id number] DOB: 1951-08-04  ? ?PCP: [name], MD ? ?Patient is from: Home.  Lives with wife.  Uses rolling walker at baseline. ? ?DOA: 11/25/2021 LOS: 2 ? ?Chief complaints ?No chief complaint on file. ?  ? ?Brief Narrative / Interim history: ?[name] with PMH of prostate cancer with osseous metastasis, urinary retention with chronic Foley catheter, paroxysmal A-fib and DVT on Coumadin, DM-2, HTN and anemia presenting with low blood glucose to 30s, fatigue and diarrhea for 2 days, and found to be in A-fib with RVR with HR to 130s and hypoglycemic to 45.  He was also anemic with hemoglobin down to 7.4 (baseline 8-9).  UA with pyuria and bacteria concerning for UTI.  CT abdomen and pelvis showed bilateral hydronephrosis and hydroureter without obstructing colliculi which seems to be chronic.  Patient was continued on home amiodarone and warfarin for atrial fibrillation with improvement in heart rate.  He was transfused 1 unit.  Received D50 amp with improvement in hypoglycemia. ? ?Slowly titrating home metoprolol for A-fib with RVR.  ? ?Subjective: ?Seen and examined earlier this morning.  He was somewhat frustrated and right for not getting immediate response to his calls.  He felt short of breath and he had crackles in the evening that has improved.  His breathing is better today.  He denies chest pain, orthopnea, PND or GI symptoms. ? ?Objective: ?Vitals:  ? 11/26/21 1330 11/26/21 2212 11/27/21 0428 11/27/21 1003  ?BP: 112/88 120/84 127/87 (!) 109/92  ?Pulse: 100 (!) 102 93 (!) 107  ?Resp:  16 20   ?Temp: 98 ?F (36.7 ?C) 98.2 ?F (36.8 ?C) 98.5 ?F (36.9 ?C)   ?TempSrc: Oral Oral Oral   ?SpO2: 96% 99% 96%   ?Weight:      ?Height:      ? ? ?Examination: ? ?GENERAL: No apparent distress.  Nontoxic. ?HEENT: MMM.  Vision and hearing grossly intact.  ?NECK: Supple.  No apparent JVD.  ?RESP:  No IWOB.  Crackles bilaterally, left> right. ?CVS:  RRR. Heart sounds normal.   ?ABD/GI/GU: BS+. Abd soft, NTND.  Indwelling Foley catheter.  Clear urine in the bag. ?MSK/EXT:  Moves extremities.  1+ BLE edema. ?SKIN: no apparent skin lesion or wound ?NEURO: Awake and alert. Oriented appropriately.  No apparent focal neuro deficit. ?PSYCH: Calm. Normal affect.  ? ?Procedures:  ?None ? ?Microbiology summarized: ?COVID-19 and influenza PCR nonreactive. ?Blood cultures NGTD. ?C. difficile PCR negative. ?GI panel pending. ? ?Assessment and Plan: ?* Atrial fibrillation with RVR ([place]) ?HR as high as 130s on presentation but improved.  Currently, HR in 90s to 100.  Hypotension resolved.  Heart rate control has been a challenge due to hypotension in the past.  INR therapeutic. ?-Continue home amiodarone and warfarin ?-Increase metoprolol to 25 mg twice daily. On 100 mg twice daily at home.  ?-Monitor anemia and hypoglycemia as below ?-Optimize electrolytes ? ?Uncontrolled type 2 diabetes mellitus with hypoglycemia, without long-term current use of insulin ([place]) ?Likely iatrogenic from glimepiride and other hypoglycemic agents in the setting of poor p.o. intake and diarrhea.  Seems to have resolved.  ?Recent Labs  ?Lab 11/27/21 ?0221 11/27/21 ?0359 11/27/21 ?2841 11/27/21 ?3244 11/27/21 ?1234  ?GLUCAP 114* 97 123* 107* 168*  ?-CBG monitoring ACHS ?-Start SSI-very sensitive ?-Liberated diet ?-Recommend discontinuing glimepiride on discharge ? ?Hypotension ?Multifactorial including medications (Lasix and metoprolol) or hypogonadism from [facility] for his prostate cancer.  UA concerning for UTI.  A.m. cortisol appropriately  elevated.  TSH within normal.  Hypotension seems to have resolved. ?-Increase home metoprolol to 25 mg twice daily for A-fib. ?-Treat UTI as above ? ?Catheter-associated urinary tract infection ([place]) ?Patient with chronic Foley due to chronic urinary retention with chronic hydronephrosis.  UA with pyuria and bacteria.  Urine culture with Klebsiella pneumonia.  Blood cultures  NGTD. ?-Continue IV ceftriaxone pending urine culture sensitivity.  No history of ESBL. ?-Exchange Foley catheter. ? ?Shortness of breath ?Multifactorial including A-fib, anemia, physical deconditioning and possible diastolic CHF.  He has crackles and BLE edema on exam but no work of breathing. His CXR did not suggest CHF. He is on Lasix at home.  His last TTE in 09/2021 was basically normal but indeterminate DD.  His oxygen saturation is reassuring.  His soft blood pressure precludes diuretics.   ?-Treat anemia, A-fib, possible UTI and possible atelectasis ?-Encourage incentive spirometry ?-OOB/PT/OT ?-Repeat CXR if worse ? ?Hypomagnesemia and hypophosphatemia ?Mg 1.7 (improved).  P1.5. ?-IV sodium phosphate 30 mmol x 1 ? ?Lactic acidosis ?Due to infection/UTI?  He is also on metformin at home. ?-Continue IV antibiotics ? ?Anemia of chronic disease ?Recent Labs  ?  09/11/21 ?0421 09/24/21 ?0925 10/11/21 ?5035 10/21/21 ?4656 10/24/21 ?0900 10/25/21 ?1102 11/25/21 ?8127 11/26/21 ?0116 11/26/21 ?5170 11/27/21 ?0320  ?HGB 8.8* 8.3* 7.9* 7.9* 7.7* 8.2* 7.4* 7.5* 8.4* 7.8*  ?Likely from malignancy and Xofigo.  Anemia panel with folic acid deficiency and borderline B12 deficiency.  Oncology transfuses intermittently.  Transfused 1 unit with appropriate response but Hgb slightly trended down again.  LDH 187.  Haptoglobin elevated to 412 arguing against hemolysis. ?-Monitor H&H.   ?-Continue folic acid and vitamin B12 supplementation ?-Transfuse as appropriate. ? ?Hypoalbuminemia ?Albumin 1.8. ?-Optimize nutrition ? ?Obesity (BMI 30-39.9) ?Body mass index is 31.66 kg/m?.  ?-See malnutrition ? ?Generalized weakness ?PT/OT eval ? ?Malnutrition of moderate degree ?Nutrition Status: ?Nutrition Problem: Moderate Malnutrition ?Etiology: chronic illness, cancer and cancer related treatments ?Signs/Symptoms: mild fat depletion, mild muscle depletion, moderate muscle depletion ?Interventions: Boost Plus ? ?Diarrhea ?Could be due to  metformin.  Seems to have resolved.  C. difficile and GIP negative.  Abdominal exam benign. ? ?Malignant neoplasm of prostate metastatic to bone [facility]) ?Follows with oncology Dr. Alen Blew. On androgen deprivation therapy.   ?-Continue home [place] ? ?Chronic urinary retention with chronic bilateral hydronephrosis ?Likely due to prostate cancer.  Stable. ?-Continue indwelling Foley catheter ? ?Bilateral lower extremity edema ?Multifactorial including CHF, hypoalbuminemia, anemia, malignancy, venous insufficiency and possible right ventricular failure. ?-Encourage leg elevation ?-TED hose ?-Monitor anemia and A-fib as above ?-Consider trial of Lasix with albumin if BP stable. ? ?History of DVT (deep vein thrombosis) ?Continue warfarin per pharmacy. ? ? ?  ?DVT prophylaxis:  ?Place TED hose Start: 11/27/21 0918  ?warfarin (COUMADIN) tablet 2.5 mg  ?Code Status: Full code ?Family Communication: Patient and/or RN. Available if any question.  ?Level of care: Telemetry ?Status is: Inpatient ?Remains inpatient appropriate because: Due to hypoglycemia, A-fib with RVR, UTI and hypotension ? ? ?Final disposition: Likely home in the next 24 to 48 hours ? ?Consultants:  ?None ? ?Sch Meds:  ?Scheduled Meds: ? amiodarone  200 mg Oral Daily  ? cyanocobalamin  1,000 mcg Intramuscular Once  ? folic acid  1 mg Oral Daily  ? insulin aspart  0-6 Units Subcutaneous TID WC  ? lactose free nutrition  237 mL Oral BID BM  ? metoprolol tartrate  25 mg Oral BID  ? vitamin B-12  1,000 mcg  Oral Daily  ? warfarin  2.5 mg Oral ONCE-1600  ? Warfarin - Pharmacist Dosing Inpatient   Does not apply T4707  ? ?Continuous Infusions: ? cefTRIAXone (ROCEPHIN)  IV 1 g (11/26/21 2300)  ? ?PRN Meds:.dextrose ? ?Antimicrobials: ?Anti-infectives (From admission, onward)  ? ? Start     Dose/Rate Route Frequency Ordered Stop  ? 11/26/21 2300  cefTRIAXone (ROCEPHIN) 1 g in sodium chloride 0.9 % 100 mL IVPB       ? 1 g ?200 mL/hr over 30 Minutes Intravenous Every 24  hours 11/25/21 2350    ? 11/25/21 2145  cefTRIAXone (ROCEPHIN) 1 g in sodium chloride 0.9 % 100 mL IVPB       ? 1 g ?200 mL/hr over 30 Minutes Intravenous  Once 11/25/21 2141 11/26/21 0008  ? ?  ? ? ? ?I have personall

## 2021-11-27 NOTE — Progress Notes (Signed)
ANTICOAGULATION CONSULT NOTE ? ?Pharmacy Consult for Warfarin ?Indication: atrial fibrillation and h/o DVT ? ?Allergies  ?Allergen Reactions  ? Shingrix [Zoster Vac Recomb Adjuvanted] Shortness Of Breath and Other (See Comments)  ?  Pulse increased to 170+ and it affected the A-Fib  ? ? ?Patient Measurements: ?Height: '5\' 11"'$  (180.3 cm) ?Weight: 103 kg (227 lb) ?IBW/kg (Calculated) : 75.3 ?  ? ?Vital Signs: ?Temp: 98.5 ?F (36.9 ?C) (03/29 0428) ?Temp Source: Oral (03/29 0428) ?BP: 127/87 (03/29 0428) ?Pulse Rate: 93 (03/29 0428) ? ?Labs: ?Recent Labs  ?  11/25/21 ?5248 11/26/21 ?0116 11/26/21 ?1859 11/27/21 ?0320  ?HGB 7.4* 7.5* 8.4* 7.8*  ?HCT 24.1* 25.2* 27.9* 26.4*  ?PLT PLATELET CLUMPS NOTED ON SMEAR, UNABLE TO ESTIMATE 114*  --  107*  ?APTT 66*  --   --   --   ?LABPROT 28.6* 28.2*  --  27.7*  ?INR 2.7* 2.6*  --  2.6*  ?CREATININE 0.65 0.81  --  0.81  ? ? ? ?Estimated Creatinine Clearance: 105.2 mL/min (by C-G formula based on SCr of 0.81 mg/dL). ? ? ?Medical History: ?Past Medical History:  ?Diagnosis Date  ? Arthritis   ? Atrial fibrillation ([place])   ? Complication of anesthesia   ? needs to sit up  at 45 degree angle or gets afib during surgery or in recovery  ? Diabetes mellitus without complication ([place])   ? type 2  ? DVT (deep venous thrombosis) ([place]) 2015  ? left leg.behind knee  ? Foley catheter in place last changed 2eeks ago  ? last 7 months  ? GERD (gastroesophageal reflux disease)   ? Hx of radiation therapy 40 tx 2015  ? Hypercholesterolemia   ? Hypertension   ? Prostate cancer ([place]) 12/06/13  ? gleason 4+3=7, 6/12 cores positive. seed implant and radiation  ? Tourette's syndrome   ? Urinary retention   ? Wears glasses   ? ? ?Medications:  ?Warfarin PTA - patient reports taking '5mg'$  daily except 7.'5mg'$  on Mon/Fri per med hx with last dose 3/27 at 1000 (this was dose per 2/27 anti-coag cards visit note); however, per last anti-coag outpatient visit with cardiology on 3/23, reported dose was to be '5mg'$  daily  except 2.'5mg'$  every Wed. Dose has been steadily decreased over the last month per cards notes due to supratherapeutic INRs ? ?Assessment: ?71 yr male with hypoglycemia and AFib.  PMH significant for HTN, DVT, HLD, AFib, DM, prostate cancer, indwelling foley. INR = 2.7 (therapeutic) on admission ? ?INR therapeutic ?CBC: Hgb 7.8 down some after getting blood transfusion 3/28, Plts 107 ?NO reported bleeding per notes ? ? ?Goal of Therapy:  ?INR 2-3 ?Monitor platelets by anticoagulation protocol: Yes ?  ?Plan:  ?Continue with patient's home warfarin regimen as above - 2.'5mg'$  today ?Daily PT/INR ? ? ?Adrian Saran, PharmD, BCPS ?Secure Chat if ?s or call number per assignments ?11/27/2021 8:41 AM ? ? ? ?

## 2021-11-27 NOTE — Assessment & Plan Note (Addendum)
Improved.  Mg 1.7.  P2.0. ?

## 2021-11-27 NOTE — Evaluation (Signed)
Occupational Therapy Evaluation ?Patient Details ?Name: Tommy Yang ?MRN: [id number] ?DOB: 1951-04-06 ?Today's Date: 11/27/2021 ? ? ?History of Present Illness 71 yo male admitted with Afib with RVR. Hx of prostate ca with mets, chronic indwelling catheter, Afib, DVT, DM, Tourette's Syndrome, hypotension 2* BP meds?, anemia  ? ?Clinical Impression ?  ?Patient is a 71 year old male with noted increased edema in RUE and BLE with increased difficulty to participate in ADL tasks. Patient was noted to have increased HR to 170 bpm with simple attempt at standing from recliner in room. Patient was noted to have decreased functional activity tolerance, decreased endurance, decreased standing balance, decreased knowledge of AE/AD impacting participation in ADLs. Patient would continue to benefit from skilled OT services at this time while admitted and after d/c to address noted deficits in order to improve overall safety and independence in ADLs.  ?  ?   ? ?Recommendations for follow up therapy are one component of a multi-disciplinary discharge planning process, led by the attending physician.  Recommendations may be updated based on patient status, additional functional criteria and insurance authorization.  ? ?Follow Up Recommendations ? Home health OT  ?  ?Assistance Recommended at Discharge Frequent or constant Supervision/Assistance  ?Patient can return home with the following A little help with walking and/or transfers;A lot of help with bathing/dressing/bathroom;Assistance with cooking/housework;Direct supervision/assist for medications management;Assist for transportation;Help with stairs or ramp for entrance;Direct supervision/assist for financial management ? ?  ?Functional Status Assessment ? Patient has had a recent decline in their functional status and demonstrates the ability to make significant improvements in function in a reasonable and predictable amount of time.  ?Equipment Recommendations ? Other  (comment) (total hip kit)  ?  ?Recommendations for Other Services   ? ? ?  ?Precautions / Restrictions Precautions ?Precautions: Fall ?Precaution Comments: monitor HR ?Restrictions ?Weight Bearing Restrictions: No  ? ?  ? ?Mobility Bed Mobility ?  ?  ?  ?  ?  ?  ?  ?General bed mobility comments: patient was up in recliner at start of session and remained at the same. ?  ? ?Transfers ?  ?  ?  ?  ?  ?  ?  ?  ?  ?  ?  ? ?  ?Balance Overall balance assessment: Needs assistance ?Sitting-balance support: No upper extremity supported, Feet supported ?Sitting balance-Leahy Scale: Fair ?Sitting balance - Comments: sitting off the back of the recliner ?  ?  ?  ?  ?  ?  ?  ?  ?  ?  ?  ?  ?  ?  ?  ?   ? ?ADL either performed or assessed with clinical judgement  ? ?ADL Overall ADL's : Needs assistance/impaired ?Eating/Feeding: Set up;Sitting ?  ?Grooming: Dance movement psychotherapist;Set up;Sitting ?  ?Upper Body Bathing: Minimal assistance;Sitting ?  ?Lower Body Bathing: Maximal assistance;Bed level ?  ?Upper Body Dressing : Sitting;Minimal assistance ?  ?Lower Body Dressing: Maximal assistance;Bed level ?Lower Body Dressing Details (indicate cue type and reason): patient is unable to lift BLE high enough to engage in LB Dressing tasks with edema in BLE ?  ?Toilet Transfer Details (indicate cue type and reason): patient was min guard for sit to stand with RW with HR noted to increase to 170 bpm. transfer attempt was stopped and patient was returned to recliner. ?Toileting- Clothing Manipulation and Hygiene: Maximal assistance;Sit to/from stand ?  ?  ?  ?  ?   ? ? ? ?Vision Baseline  Vision/History: 1 Wears glasses;4 Cataracts ?Patient Visual Report: No change from baseline ?   ?   ?Perception   ?  ?Praxis   ?  ? ?Pertinent Vitals/Pain    ? ? ? ?Hand Dominance Right ?  ?Extremity/Trunk Assessment Upper Extremity Assessment ?Upper Extremity Assessment: RUE deficits/detail;LUE deficits/detail ?RUE Deficits / Details: patient was able to abduct  and flex to just under 90 degrees. hand wrist and elbow WFL strength 3+/5 ?LUE Deficits / Details: hand wrist and elbow ROM WFL. patient was able to ROM Shoulder to above 90 degrees with drifting with longer holding in position. patient reports no history of injuried in BUE or pain. patient strength was 3+/5 ?  ?Lower Extremity Assessment ?Lower Extremity Assessment: Defer to PT evaluation ?  ?Cervical / Trunk Assessment ?Cervical / Trunk Assessment: Normal ?  ?Communication Communication ?Communication: No difficulties ?  ?Cognition   ?  ?  ?  ?  ?  ?  ?  ?  ?  ?  ?  ?  ?  ?  ?  ?  ?  ?  ?  ?  ?  ?General Comments    ? ?  ?Exercises   ?  ?Shoulder Instructions    ? ? ?Home Living Family/patient expects to be discharged to:: Private residence ?Living Arrangements: Spouse/significant other ?Available Help at Discharge: Family ?Type of Home: House ?Home Access: Stairs to enter ?Entrance Stairs-Number of Steps: 2 + landing ?  ?Home Layout: One level ?  ?  ?  ?  ?  ?  ?  ?Home Equipment: BSC/3in1;Rolling Walker (2 wheels);Wheelchair - manual;Cane - single point ?  ?  ?  ? ?  ?Prior Functioning/Environment Prior Level of Function : Needs assist ?  ?  ?  ?  ?  ?  ?Mobility Comments: using RW vs WC. having much more difficulty as of late. has had to call 911 when he has fallen at home ?ADLs Comments: wife stands-by when getting in/out of shower. typically does sponge bathing. patient has a 3in1 commode near him in living room that he uses on weaker days. ?  ? ?  ?  ?OT Problem List: Decreased activity tolerance;Decreased knowledge of use of DME or AE;Decreased safety awareness;Decreased cognition;Cardiopulmonary status limiting activity;Decreased knowledge of precautions;Increased edema ?  ?   ?OT Treatment/Interventions: Self-care/ADL training;DME and/or AE instruction;Therapeutic activities;Balance training;Therapeutic exercise;Neuromuscular education;Energy conservation;Patient/family education  ?  ?OT Goals(Current  goals can be found in the care plan section) Acute Rehab OT Goals ?Patient Stated Goal: to get afib under control ?OT Goal Formulation: With patient ?Time For Goal Achievement: 12/11/21 ?Potential to Achieve Goals: Good  ?OT Frequency: Min 2X/week ?  ? ?Co-evaluation   ?  ?  ?  ?  ? ?  ?AM-PAC OT "6 Clicks" Daily Activity     ?Outcome Measure Help from another person eating meals?: A Little ?Help from another person taking care of personal grooming?: A Little ?Help from another person toileting, which includes using toliet, bedpan, or urinal?: A Lot ?Help from another person bathing (including washing, rinsing, drying)?: A Lot ?Help from another person to put on and taking off regular upper body clothing?: A Lot ?Help from another person to put on and taking off regular lower body clothing?: A Lot ?6 Click Score: 14 ?  ?End of Session Equipment Utilized During Treatment: Rolling walker (2 wheels) ?Nurse Communication: Other (comment) (HR increase with minimal movement) ? ?Activity Tolerance: Treatment limited secondary to medical complications (Comment) (  HR) ?Patient left: in chair;with call bell/phone within reach ? ?OT Visit Diagnosis: Unsteadiness on feet (R26.81)  ?              ?Time: 8159-4707 ?OT Time Calculation (min): 26 min ?Charges:  OT General Charges ?$OT Visit: 1 Visit ?OT Evaluation ?$OT Eval Moderate Complexity: 1 Mod ?OT Treatments ?$Therapeutic Activity: 8-22 mins ? ?Zanayah Shadowens OTR/L, MS ?Acute Rehabilitation Department ?Office# [phone] ?Pager# [phone] ? ? ?Feliz Beam Shekia Kuper ?11/27/2021, 3:19 PM ?

## 2021-11-27 NOTE — Assessment & Plan Note (Signed)
Albumin 1.8. ?-Optimize nutrition ?

## 2021-11-27 NOTE — TOC Progression Note (Signed)
Transition of Care (TOC) - Progression Note  ? ? ?Patient Details  ?Name: Tommy Yang ?MRN: [id number] ?Date of Birth: October 09, 1950 ? ?Transition of Care (TOC) CM/SW Contact  ?Purcell Mouton, RN ?Phone Number: ?11/27/2021, 2:08 PM ? ?Clinical Narrative:    ?Tommy Yang was selected for HHPT. Referral was given to in house rep.  ? ? ?  ?  ? ?Expected Discharge Plan and Services ?  ?  ?  ?  ?  ?                ?  ?  ?  ?  ?  ?  ?  ?  ?  ?  ? ? ?Social Determinants of Health (SDOH) Interventions ?  ? ?Readmission Risk Interventions ?   ? View : No data to display.  ?  ?  ?  ? ? ?

## 2021-11-27 NOTE — Plan of Care (Signed)
?  Problem: Clinical Measurements: ?Goal: Ability to maintain clinical measurements within normal limits will improve ?Outcome: Progressing ?Goal: Will remain free from infection ?Outcome: Progressing ?Goal: Diagnostic test results will improve ?Outcome: Progressing ?  ?

## 2021-11-27 NOTE — Assessment & Plan Note (Addendum)
Multifactorial including A-fib, anemia, physical deconditioning and possible diastolic CHF.  He has crackles and BLE edema on exam but no work of breathing. His CXR did not suggest CHF. He is on Lasix at home but takes as needed.  His last TTE in 09/2021 was basically normal but indeterminate DD.  His oxygen saturation is reassuring.  Received IV Lasix with albumin with excellent urine output. ?-Counseled on sodium and fluid restrictions ?-Patient to continue home Lasix 40 mg daily ?

## 2021-11-27 NOTE — Assessment & Plan Note (Addendum)
Multifactorial including CHF with sodium and fluid indiscretion and noncompliance with Lasix, hypoalbuminemia, anemia, malignancy, venous insufficiency and possible right ventricular failure. ?-Encourage leg elevation ?-Continue TED hose ?-Received IV Lasix with albumin with good urine output ?-Continue home p.o. Lasix ?-Counseled on the importance of sodium and fluid restriction, and compliance with home Lasix ?

## 2021-11-28 DIAGNOSIS — I959 Hypotension, unspecified: Secondary | ICD-10-CM

## 2021-11-28 LAB — RENAL FUNCTION PANEL
Albumin: 1.9 g/dL — ABNORMAL LOW (ref 3.5–5.0)
Anion gap: 5 (ref 5–15)
BUN: 19 mg/dL (ref 8–23)
CO2: 24 mmol/L (ref 22–32)
Calcium: 8.5 mg/dL — ABNORMAL LOW (ref 8.9–10.3)
Chloride: 110 mmol/L (ref 98–111)
Creatinine, Ser: 0.64 mg/dL (ref 0.61–1.24)
GFR, Estimated: >60 mL/min (ref >60)
Glucose, Bld: 159 mg/dL — ABNORMAL HIGH (ref 70–99)
Phosphorus: 2 mg/dL — ABNORMAL LOW (ref 2.5–4.6)
Potassium: 4.1 mmol/L (ref 3.5–5.1)
Sodium: 139 mmol/L (ref 135–145)

## 2021-11-28 LAB — CBC
HCT: 26.7 % — ABNORMAL LOW (ref 39.0–52.0)
Hemoglobin: 8 g/dL — ABNORMAL LOW (ref 13.0–17.0)
MCH: 28 pg (ref 26.0–34.0)
MCHC: 30 g/dL (ref 30.0–36.0)
MCV: 93.4 fL (ref 80.0–100.0)
Platelets: 91 10*3/uL — ABNORMAL LOW (ref 150–400)
RBC: 2.86 MIL/uL — ABNORMAL LOW (ref 4.22–5.81)
RDW: 21.9 % — ABNORMAL HIGH (ref 11.5–15.5)
WBC: 7 10*3/uL (ref 4.0–10.5)
nRBC: 0.4 % — ABNORMAL HIGH (ref 0.0–0.2)

## 2021-11-28 LAB — BRAIN NATRIURETIC PEPTIDE: B Natriuretic Peptide: 489.9 pg/mL — ABNORMAL HIGH (ref 0.0–100.0)

## 2021-11-28 LAB — URINE CULTURE: Culture: >=100000 — AB

## 2021-11-28 LAB — PROTIME-INR
INR: 2 — ABNORMAL HIGH (ref 0.8–1.2)
Prothrombin Time: 22.8 seconds — ABNORMAL HIGH (ref 11.4–15.2)

## 2021-11-28 LAB — MAGNESIUM: Magnesium: 1.7 mg/dL (ref 1.7–2.4)

## 2021-11-28 LAB — GLUCOSE, CAPILLARY
Glucose-Capillary: 187 mg/dL — ABNORMAL HIGH (ref 70–99)
Glucose-Capillary: 179 mg/dL — ABNORMAL HIGH (ref 70–99)

## 2021-11-28 MED ORDER — CHLORHEXIDINE GLUCONATE CLOTH 2 % EX PADS
6.0000 | MEDICATED_PAD | Freq: Every day | CUTANEOUS | Status: DC
  Administered 2021-11-28: 6 via TOPICAL

## 2021-11-28 MED ORDER — ALBUMIN HUMAN 25 % IV SOLN
25.0000 g | Freq: Once | INTRAVENOUS | Status: AC
  Administered 2021-11-28: 25 g via INTRAVENOUS
  Filled 2021-11-28: qty 100

## 2021-11-28 MED ORDER — MAGNESIUM SULFATE 2 GM/50ML IV SOLN
2.0000 g | Freq: Once | INTRAVENOUS | Status: AC
  Administered 2021-11-28: 2 g via INTRAVENOUS
  Filled 2021-11-28: qty 50

## 2021-11-28 MED ORDER — FOLIC ACID 1 MG PO TABS: 1.0000 mg | ORAL_TABLET | Freq: Every day | ORAL | 0 refills | Status: AC

## 2021-11-28 MED ORDER — METOPROLOL TARTRATE 100 MG PO TABS: ORAL_TABLET | ORAL | 3 refills | Status: DC

## 2021-11-28 MED ORDER — SYNJARDY XR 12.5-1000 MG PO TB24
ORAL_TABLET | ORAL | 3 refills | Status: AC
Start: 2021-12-02 — End: ?

## 2021-11-28 MED ORDER — FUROSEMIDE 40 MG PO TABS: 40.0000 mg | ORAL_TABLET | Freq: Every day | ORAL | Status: DC

## 2021-11-28 MED ORDER — WARFARIN SODIUM 5 MG PO TABS
5.0000 mg | ORAL_TABLET | Freq: Once | ORAL | Status: DC
Start: 2021-11-28 — End: 2021-11-28

## 2021-11-28 MED ORDER — FUROSEMIDE 10 MG/ML IJ SOLN
40.0000 mg | Freq: Once | INTRAMUSCULAR | Status: AC
  Administered 2021-11-28: 40 mg via INTRAVENOUS
  Filled 2021-11-28: qty 4

## 2021-11-28 MED ORDER — WARFARIN SODIUM 5 MG PO TABS: ORAL_TABLET | ORAL | 0 refills | Status: DC

## 2021-11-28 MED ORDER — CEFADROXIL 1 G PO TABS: 1.0000 g | ORAL_TABLET | Freq: Two times a day (BID) | ORAL | 0 refills | Status: AC

## 2021-11-28 NOTE — Plan of Care (Signed)
?Problem: Education: ?Goal: Knowledge of General Education information will improve ?Description: Including pain rating scale, medication(s)/side effects and non-pharmacologic comfort measures ?11/28/2021 1408 by Jerene Pitch, RN ?Outcome: Adequate for Discharge ?11/28/2021 1407 by Jerene Pitch, RN ?Outcome: Adequate for Discharge ?  ?Problem: Health Behavior/Discharge Planning: ?Goal: Ability to manage health-related needs will improve ?11/28/2021 1408 by Jerene Pitch, RN ?Outcome: Adequate for Discharge ?11/28/2021 1407 by Jerene Pitch, RN ?Outcome: Adequate for Discharge ?  ?Problem: Clinical Measurements: ?Goal: Ability to maintain clinical measurements within normal limits will improve ?11/28/2021 1408 by Jerene Pitch, RN ?Outcome: Adequate for Discharge ?11/28/2021 1407 by Jerene Pitch, RN ?Outcome: Adequate for Discharge ?Goal: Will remain free from infection ?11/28/2021 1408 by Jerene Pitch, RN ?Outcome: Adequate for Discharge ?11/28/2021 1407 by Jerene Pitch, RN ?Outcome: Adequate for Discharge ?11/28/2021 0753 by Jerene Pitch, RN ?Outcome: Progressing ?Goal: Diagnostic test results will improve ?11/28/2021 1408 by Jerene Pitch, RN ?Outcome: Adequate for Discharge ?11/28/2021 1407 by Jerene Pitch, RN ?Outcome: Adequate for Discharge ?Goal: Respiratory complications will improve ?11/28/2021 1408 by Jerene Pitch, RN ?Outcome: Adequate for Discharge ?11/28/2021 1407 by Jerene Pitch, RN ?Outcome: Adequate for Discharge ?Goal: Cardiovascular complication will be avoided ?11/28/2021 1408 by Jerene Pitch, RN ?Outcome: Adequate for Discharge ?11/28/2021 1407 by Jerene Pitch, RN ?Outcome: Adequate for Discharge ?  ?Problem: Activity: ?Goal: Risk for activity intolerance will decrease ?11/28/2021 1408 by Jerene Pitch, RN ?Outcome: Adequate for Discharge ?11/28/2021 1407 by Jerene Pitch, RN ?Outcome: Adequate for Discharge ?11/28/2021 0753 by Jerene Pitch,  RN ?Outcome: Progressing ?  ?Problem: Nutrition: ?Goal: Adequate nutrition will be maintained ?11/28/2021 1408 by Jerene Pitch, RN ?Outcome: Adequate for Discharge ?11/28/2021 1407 by Jerene Pitch, RN ?Outcome: Adequate for Discharge ?11/28/2021 0753 by Jerene Pitch, RN ?Outcome: Progressing ?  ?Problem: Coping: ?Goal: Level of anxiety will decrease ?11/28/2021 1408 by Jerene Pitch, RN ?Outcome: Adequate for Discharge ?11/28/2021 1407 by Jerene Pitch, RN ?Outcome: Adequate for Discharge ?  ?Problem: Elimination: ?Goal: Will not experience complications related to bowel motility ?11/28/2021 1408 by Jerene Pitch, RN ?Outcome: Adequate for Discharge ?11/28/2021 1407 by Jerene Pitch, RN ?Outcome: Adequate for Discharge ?Goal: Will not experience complications related to urinary retention ?11/28/2021 1408 by Jerene Pitch, RN ?Outcome: Adequate for Discharge ?11/28/2021 1407 by Jerene Pitch, RN ?Outcome: Adequate for Discharge ?  ?Problem: Pain Managment: ?Goal: General experience of comfort will improve ?11/28/2021 1408 by Jerene Pitch, RN ?Outcome: Adequate for Discharge ?11/28/2021 1407 by Jerene Pitch, RN ?Outcome: Adequate for Discharge ?  ?Problem: Safety: ?Goal: Ability to remain free from injury will improve ?11/28/2021 1408 by Jerene Pitch, RN ?Outcome: Adequate for Discharge ?11/28/2021 1407 by Jerene Pitch, RN ?Outcome: Adequate for Discharge ?  ?Problem: Skin Integrity: ?Goal: Risk for impaired skin integrity will decrease ?11/28/2021 1408 by Jerene Pitch, RN ?Outcome: Adequate for Discharge ?11/28/2021 1407 by Jerene Pitch, RN ?Outcome: Adequate for Discharge ?  ?Problem: Education: ?Goal: Knowledge of disease or condition will improve ?11/28/2021 1408 by Jerene Pitch, RN ?Outcome: Adequate for Discharge ?11/28/2021 1407 by Jerene Pitch, RN ?Outcome: Adequate for Discharge ?Goal: Understanding of medication regimen will improve ?11/28/2021 1408 by Jerene Pitch, RN ?Outcome: Adequate for Discharge ?11/28/2021 1407 by Jerene Pitch, RN ?Outcome: Adequate for Discharge ?Goal: Individualized Educational Video(s) ?11/28/2021 1408 by Jerene Pitch, RN ?Outcome: Adequate  for Discharge ?11/28/2021 1407 by Jerene Pitch, RN ?Outcome: Adequate for Discharge ?  ?Problem: Activity: ?Goal: Ability to tolerate increased activity will improve ?11/28/2021 1408 by Jerene Pitch, RN ?Outcome: Adequate for Discharge ?11/28/2021 1407 by Jerene Pitch, RN ?Outcome: Adequate for Discharge ?  ?Problem: Cardiac: ?Goal: Ability to achieve and maintain adequate cardiopulmonary perfusion will improve ?11/28/2021 1408 by Jerene Pitch, RN ?Outcome: Adequate for Discharge ?11/28/2021 1407 by Jerene Pitch, RN ?Outcome: Adequate for Discharge ?  ?Problem: Health Behavior/Discharge Planning: ?Goal: Ability to safely manage health-related needs after discharge will improve ?11/28/2021 1408 by Jerene Pitch, RN ?Outcome: Adequate for Discharge ?11/28/2021 1407 by Jerene Pitch, RN ?Outcome: Adequate for Discharge ?  ?Problem: Malnutrition  (NI-5.2) ?Goal: Food and/or nutrient delivery ?Description: Individualized approach for food/nutrient provision. ?Outcome: Adequate for Discharge ?  ?Problem: Acute Rehab PT Goals(only PT should resolve) ?Goal: Pt Will Go Supine/Side To Sit ?Outcome: Adequate for Discharge ?Goal: Pt Will Go Sit To Supine/Side ?Outcome: Adequate for Discharge ?Goal: Patient Will Transfer Sit To/From Stand ?Outcome: Adequate for Discharge ?Goal: Pt Will Ambulate ?Outcome: Adequate for Discharge ?  ?Problem: Acute Rehab OT Goals (only OT should resolve) ?Goal: Pt. Will Perform Lower Body Dressing ?Outcome: Adequate for Discharge ?Goal: Pt. Will Transfer To Toilet ?Outcome: Adequate for Discharge ?Goal: Pt. Will Perform Toileting-Clothing Manipulation ?Outcome: Adequate for Discharge ?  ?

## 2021-11-28 NOTE — Discharge Summary (Signed)
? ?Physician Discharge Summary  ?Tommy Yang LTJ:[id number] DOB: February 28, 1951 DOA: 11/25/2021 ? ?PCP: [name], MD ? ?Admit date: 11/25/2021 ?Discharge date: 11/28/2021 ?Admitted From: Home ?Disposition: Home ?Recommendations for Outpatient Follow-up:  ?Follow ups as below. ?Please obtain CBC/BMP/Mag at follow up ?Please follow up on the following pending results: None ? ?Home Health: PT/OT ?Equipment/Devices: None ? ?Discharge Condition: Stable ?CODE STATUS: Full code ? Follow-up Information   ? ? [name], MD. Schedule an appointment as soon as possible for a visit in 1 week(s).   ?Specialties: Internal Medicine, Radiology ?Contact information: ?[place] [address] ?[phone] ? ? ?  ?  ? ? Pixie Casino, MD .   ?Specialty: Cardiology ?Contact information: ?[address] ?[address] ?[place] [address] ?[phone] ? ? ?  ?  ? ?  ?  ? ?  ? ? ?Hospital course ?[name] with PMH of prostate cancer with osseous metastasis, urinary retention with chronic Foley catheter, paroxysmal A-fib and DVT on Coumadin, DM-2, HTN and anemia presenting with low blood glucose to 30s, fatigue and diarrhea for 2 days, and found to be in A-fib with RVR with HR to 130s and hypoglycemic to 45.  He was also anemic with hemoglobin down to 7.4 (baseline 8-9).  UA with pyuria and bacteria concerning for UTI.  CT abdomen and pelvis showed bilateral hydronephrosis and hydroureter without obstructing colliculi which seems to be chronic.   ? ?Patient was continued on home amiodarone and warfarin for atrial fibrillation with improvement in heart rate.  Slowly restarted home metoprolol as blood pressure allows.  He will be discharged on home amiodarone and warfarin.  We will start him home metoprolol at 50 mg twice daily for 1 day, then increase to home dose (100 mg twice daily).  Advised to follow-up with his cardiologist in 1 to 2 weeks or sooner if needed. ? ?Patient was started on IV ceftriaxone  for catheter associated UTI.  Catheter replaced on 11/27/2020.  Urine culture with pansensitive Klebsiella pneumonia except to ampicillin.  He is discharged on p.o. cefadroxil to complete total of 6 days course.  Advised to hold home Synjardy until he completes antibiotic course. ? ?In regards to anemia, he was transfused 1 unit with appropriate response.  H&H remained stable.  Haptoglobin high arguing against hemolytic process.  Anemia panel consistent with anemia of chronic disease and folate deficiency and borderline vitamin B12.  He is discharged on folic acid and p.o. vitamin B12.  Recommend repeat CBC in 1 to 2 weeks ? ?In regards to NIDDM-2 and hypoglycemia, he received D50 amp with resolution of hypoglycemia.  Glimepiride discontinued indefinitely.  Advised to hold home Synjardy until he completes antibiotic course for UTI.  Patient to continue home Ozempic.  Patient's recent A1c was 6.9% last month. ? ?Patient has significant edema which is multifactorial including fluid and sodium indiscretion, anemia, hypoalbuminemia, venous insufficiency, malignancy and possible underlying diastolic CHF with some degree of pulmonary hypertension.  He has no significant cardiopulmonary symptoms.  He received IV Lasix with IV albumin with excellent urine output.  He is discharged on p.o. Lasix.  He was counseled on sodium and fluid restrictions.  Reassess at follow-up. ? ?On the day of discharge, patient felt well except for back pain from lying in hospital bed, and he was eager to go home. ?  ? ?See individual problem list below for more on hospital course. ? ?Problems addressed during this hospitalization ?Problem  ?Atrial  Fibrillation With Rvr ([facility])  ?Hypotension  ?Uncontrolled Type 2 Diabetes Mellitus With Hypoglycemia, Without Long-Term Current Use of Insulin ([facility])  ?Catheter-Associated Urinary Tract Infection ([facility])  ?Shortness of Breath  ?Hypomagnesemia and hypophosphatemia  ?Lactic Acidosis  ?Anemia of Chronic  Disease  ?Hypoalbuminemia  ?Malnutrition of moderate degree  ?Generalized Weakness  ?Obesity (Bmi 30-39.9)  ?Hypomagnesemia  ?Diarrhea  ?Malignant Neoplasm of Prostate Metastatic to Bone ([facility])  ?Chronic urinary retention with chronic bilateral hydronephrosis  ?History of Dvt (Deep Vein Thrombosis)  ?Bilateral Lower Extremity Edema  ?  ?Assessment and Plan: ?* Atrial fibrillation with RVR ([place]) ?HR as high as 130s on presentation but improved.  Currently, HR in 90s to 100.  Hypotension resolved.  INR therapeutic.  Tolerated metoprolol at 25 mg twice daily ?-Continue home amiodarone and warfarin ?-Increase metoprolol to 50 mg twice daily for 1 day followed by home dose (100 mg twice daily) ?-Recommended outpatient follow-up with his cardiologist in 1 to 2 weeks or sooner. ? ?Uncontrolled type 2 diabetes mellitus with hypoglycemia, without long-term current use of insulin ([place]) ?Last A1c 6.9%.  Hypoglycemia resolved.  Discontinue glimepiride.  Patient to hold home Synjardy for 3 days until he completes antibiotic course for UTI.  Patient to continue Ozempic. ? ? ?Hypotension ?Multifactorial including medications (Lasix and metoprolol) or hypogonadism from [facility] for his prostate cancer and urinary tract infection. A.m. cortisol appropriately elevated.  TSH within normal.  Hypotension resolved. ? ?Catheter-associated urinary tract infection ([place]) ?Patient with chronic Foley due to chronic urinary retention with chronic hydronephrosis.  UA with pyuria and bacteria.  Urine culture with pansensitive Klebsiella pneumonia except to ampicillin.  Blood cultures NGTD. ?-Received IV ceftriaxone for 3 days and discharged on p.o. cefadroxil 1 g twice daily for 3 more days. ?-Foley exchanged on 11/27/2021 ? ?Shortness of breath ?Multifactorial including A-fib, anemia, physical deconditioning and possible diastolic CHF.  He has crackles and BLE edema on exam but no work of breathing. His CXR did not suggest CHF. He is on Lasix at  home but takes as needed.  His last TTE in 09/2021 was basically normal but indeterminate DD.  His oxygen saturation is reassuring.  Received IV Lasix with albumin with excellent urine output. ?-Counseled on sodium and fluid restrictions ?-Patient to continue home Lasix 40 mg daily ? ?Hypomagnesemia and hypophosphatemia ?Improved.  Mg 1.7.  P2.0. ? ?Lactic acidosis ?Resolved. ? ?Anemia of chronic disease ?Recent Labs  ?  09/24/21 ?0925 10/11/21 ?0947 10/21/21 ?0962 10/24/21 ?0900 10/25/21 ?1102 11/25/21 ?8366 11/26/21 ?0116 11/26/21 ?2947 11/27/21 ?0320 11/28/21 ?0406  ?HGB 8.3* 7.9* 7.9* 7.7* 8.2* 7.4* 7.5* 8.4* 7.8* 8.0*  ?Likely from malignancy and Xofigo.  Anemia panel with folic acid deficiency and borderline B12 deficiency.  Oncology transfuses intermittently.  Transfused 1 unit with appropriate response but Hgb relatively stable.  LDH 187.  Haptoglobin elevated to 412 arguing against hemolysis. ?-Continue folic acid and vitamin B12 supplementation ?-Recheck CBC at follow-up ? ?Hypoalbuminemia ?Albumin 1.8. ?-Optimize nutrition ? ?Obesity (BMI 30-39.9) ?Body mass index is 31.66 kg/m?.  ?-See malnutrition ? ?Generalized weakness ?HH PT/OT ordered. ? ?Malnutrition of moderate degree ?Nutrition Status: ?Nutrition Problem: Moderate Malnutrition ?Etiology: chronic illness, cancer and cancer related treatments ?Signs/Symptoms: mild fat depletion, mild muscle depletion, moderate muscle depletion ?Interventions: Boost Plus ? ?Diarrhea ?Could be due to metformin.  Seems to have resolved.  C. difficile and GIP negative.  Abdominal exam benign. ? ?Malignant neoplasm of prostate metastatic to bone [facility]) ?Follows with oncology Dr. Alen Blew. On androgen deprivation  therapy.   ?-Continue home Erleada ? ?Chronic urinary retention with chronic bilateral hydronephrosis ?Likely due to prostate cancer.  Stable. ?-Continue indwelling Foley catheter ? ?Bilateral lower extremity edema ?Multifactorial including CHF with sodium and fluid  indiscretion and noncompliance with Lasix, hypoalbuminemia, anemia, malignancy, venous insufficiency and possible right ventricular failure. ?-Encourage leg elevation ?-Continue TED hose ?-Received IV Lasix

## 2021-11-28 NOTE — Progress Notes (Signed)
ANTICOAGULATION CONSULT NOTE ? ?Pharmacy Consult for Warfarin ?Indication: atrial fibrillation and h/o DVT ? ?Allergies  ?Allergen Reactions  ? Shingrix [Zoster Vac Recomb Adjuvanted] Shortness Of Breath and Other (See Comments)  ?  Pulse increased to 170+ and it affected the A-Fib  ? ? ?Patient Measurements: ?Height: '5\' 11"'$  (180.3 cm) ?Weight: 103 kg (227 lb 1.2 oz) ?IBW/kg (Calculated) : 75.3 ?  ? ?Vital Signs: ?Temp: 98.3 ?F (36.8 ?C) (03/30 0416) ?Temp Source: Oral (03/30 0416) ?BP: 115/68 (03/30 0416) ?Pulse Rate: 100 (03/30 0416) ? ?Labs: ?Recent Labs  ?  11/25/21 ?6546 11/26/21 ?0116 11/26/21 ?5035 11/27/21 ?0320 11/28/21 ?0406  ?HGB 7.4* 7.5* 8.4* 7.8* 8.0*  ?HCT 24.1* 25.2* 27.9* 26.4* 26.7*  ?PLT PLATELET CLUMPS NOTED ON SMEAR, UNABLE TO ESTIMATE 114*  --  107* 91*  ?APTT 66*  --   --   --   --   ?LABPROT 28.6* 28.2*  --  27.7* 22.8*  ?INR 2.7* 2.6*  --  2.6* 2.0*  ?CREATININE 0.65 0.81  --  0.81 0.64  ? ? ? ?Estimated Creatinine Clearance: 106.5 mL/min (by C-G formula based on SCr of 0.64 mg/dL). ? ? ?Medical History: ?Past Medical History:  ?Diagnosis Date  ? Arthritis   ? Atrial fibrillation ([place])   ? Complication of anesthesia   ? needs to sit up  at 45 degree angle or gets afib during surgery or in recovery  ? Diabetes mellitus without complication ([place])   ? type 2  ? DVT (deep venous thrombosis) ([place]) 2015  ? left leg.behind knee  ? Foley catheter in place last changed 2eeks ago  ? last 7 months  ? GERD (gastroesophageal reflux disease)   ? Hx of radiation therapy 40 tx 2015  ? Hypercholesterolemia   ? Hypertension   ? Prostate cancer ([place]) 12/06/13  ? gleason 4+3=7, 6/12 cores positive. seed implant and radiation  ? Tourette's syndrome   ? Urinary retention   ? Wears glasses   ? ? ?Medications:  ?Warfarin PTA - patient reports taking '5mg'$  daily except 7.'5mg'$  on Mon/Fri per med hx with last dose 3/27 at 1000 (this was dose per 2/27 anti-coag cards visit note); however, per last anti-coag outpatient visit  with cardiology on 3/23, reported dose was to be '5mg'$  daily except 2.'5mg'$  every Wed. Dose has been steadily decreased over the last month per cards notes due to supratherapeutic INRs ? ?Assessment: ?71 yr male with hypoglycemia and AFib.  PMH significant for HTN, DVT, HLD, AFib, DM, prostate cancer, indwelling foley. INR = 2.7 (therapeutic) on admission ? ?INR therapeutic but decreased ?CBC: Hgb 8 stable, Plts 91 down ?NO reported bleeding per notes ? ? ?Goal of Therapy:  ?INR 2-3 ?Monitor platelets by anticoagulation protocol: Yes ?  ?Plan:  ?Continue with patient's home warfarin regimen as above - '5mg'$  today ?Daily PT/INR ? ? ?Adrian Saran, PharmD, BCPS ?Secure Chat if ?s or call number per assignments ?11/28/2021 8:40 AM ? ? ? ?

## 2021-11-28 NOTE — Plan of Care (Signed)
?  Problem: Education: ?Goal: Knowledge of General Education information will improve ?Description: Including pain rating scale, medication(s)/side effects and non-pharmacologic comfort measures ?Outcome: Adequate for Discharge ?  ?Problem: Health Behavior/Discharge Planning: ?Goal: Ability to manage health-related needs will improve ?Outcome: Adequate for Discharge ?  ?Problem: Clinical Measurements: ?Goal: Ability to maintain clinical measurements within normal limits will improve ?Outcome: Adequate for Discharge ?Goal: Will remain free from infection ?11/28/2021 1407 by Jerene Pitch, RN ?Outcome: Adequate for Discharge ?11/28/2021 0753 by Jerene Pitch, RN ?Outcome: Progressing ?Goal: Diagnostic test results will improve ?Outcome: Adequate for Discharge ?Goal: Respiratory complications will improve ?Outcome: Adequate for Discharge ?Goal: Cardiovascular complication will be avoided ?Outcome: Adequate for Discharge ?  ?Problem: Activity: ?Goal: Risk for activity intolerance will decrease ?11/28/2021 1407 by Jerene Pitch, RN ?Outcome: Adequate for Discharge ?11/28/2021 0753 by Jerene Pitch, RN ?Outcome: Progressing ?  ?Problem: Nutrition: ?Goal: Adequate nutrition will be maintained ?11/28/2021 1407 by Jerene Pitch, RN ?Outcome: Adequate for Discharge ?11/28/2021 0753 by Jerene Pitch, RN ?Outcome: Progressing ?  ?Problem: Coping: ?Goal: Level of anxiety will decrease ?Outcome: Adequate for Discharge ?  ?Problem: Elimination: ?Goal: Will not experience complications related to bowel motility ?Outcome: Adequate for Discharge ?Goal: Will not experience complications related to urinary retention ?Outcome: Adequate for Discharge ?  ?Problem: Pain Managment: ?Goal: General experience of comfort will improve ?Outcome: Adequate for Discharge ?  ?Problem: Safety: ?Goal: Ability to remain free from injury will improve ?Outcome: Adequate for Discharge ?  ?Problem: Skin Integrity: ?Goal: Risk for impaired skin  integrity will decrease ?Outcome: Adequate for Discharge ?  ?Problem: Education: ?Goal: Knowledge of disease or condition will improve ?Outcome: Adequate for Discharge ?Goal: Understanding of medication regimen will improve ?Outcome: Adequate for Discharge ?Goal: Individualized Educational Video(s) ?Outcome: Adequate for Discharge ?  ?Problem: Activity: ?Goal: Ability to tolerate increased activity will improve ?Outcome: Adequate for Discharge ?  ?Problem: Cardiac: ?Goal: Ability to achieve and maintain adequate cardiopulmonary perfusion will improve ?Outcome: Adequate for Discharge ?  ?Problem: Health Behavior/Discharge Planning: ?Goal: Ability to safely manage health-related needs after discharge will improve ?Outcome: Adequate for Discharge ?  ?

## 2021-11-28 NOTE — Plan of Care (Signed)
°  Problem: Clinical Measurements: °Goal: Will remain free from infection °Outcome: Progressing °  °Problem: Activity: °Goal: Risk for activity intolerance will decrease °Outcome: Progressing °  °Problem: Nutrition: °Goal: Adequate nutrition will be maintained °Outcome: Progressing °  °

## 2021-11-30 LAB — CULTURE, BLOOD (ROUTINE X 2)
Culture: NO GROWTH
Culture: NO GROWTH

## 2021-12-03 ENCOUNTER — Telehealth: Payer: Self-pay | Admitting: *Deleted

## 2021-12-03 ENCOUNTER — Encounter (HOSPITAL_COMMUNITY): Payer: BC Managed Care – PPO | Admitting: Nurse Practitioner

## 2021-12-03 NOTE — Telephone Encounter (Addendum)
Connected with [name] spouse Tommy Yang for information to complete FMLA form.  ? ?With this call Tommy Yang asks to reschedule follow up appointment missed during recent hospitalization.  ? ?"Waiting to hear from scheduler but would like to ask if Tommy Yang can be scheduled to see Dr. Alen Blew this week or next at 3:30 pm?" ? ?Aware this nurse will notify provider of appointment request.  FMLA paperwork received 11/26/2021 completed today by this forms nurse for "intermittent leave, I was out 11/11/2021 when he fell and he was recently hospitalized but, use 12/02/2021 for the start date.  Fax paperwork to [employer] and mail copy to home address".   ? ?Form to designated mail area for collaborative pick up for provider review and signature.       ?

## 2021-12-04 ENCOUNTER — Ambulatory Visit (HOSPITAL_COMMUNITY)
Admission: RE | Admit: 2021-12-04 | Discharge: 2021-12-04 | Disposition: A | Discharge status: Home / Self Care | Payer: BC Managed Care – PPO | Source: Ambulatory Visit | Attending: Nurse Practitioner | Admitting: Nurse Practitioner

## 2021-12-04 VITALS — BP 104/64 | HR 160

## 2021-12-04 DIAGNOSIS — Z5189 Encounter for other specified aftercare: Secondary | ICD-10-CM | POA: Insufficient documentation

## 2021-12-04 DIAGNOSIS — I4819 Other persistent atrial fibrillation: Secondary | ICD-10-CM

## 2021-12-04 NOTE — Progress Notes (Signed)
Pt in for BP/HR check after recent hospitalization. He states despite all the tuning up of his chronic issues., he states that he does not feel any better. His HR is elevated in the office today but he states that he is cold and he is shaking. HR's at home are in the 80/90's when he is relaxed. BP today 104/64. He states that his appetite is still poor. Will se back in 2 months.  ?

## 2021-12-04 NOTE — Telephone Encounter (Signed)
FMLA paperwork for Tommy Yang spouse Nobel Brar successfully faxed to [employer] today.  Original copy mailed to patient address on file. ?[address] ?[place] [address] ? ? ? ?

## 2021-12-09 ENCOUNTER — Telehealth: Payer: Self-pay

## 2021-12-09 NOTE — Telephone Encounter (Signed)
Patient called stating he was in the hospital and they advised him to only take synjardy.  Patient would like to know if this is still appropriate or if he needs to be on something more?  His levels are running 170-200.  Please advise ?

## 2021-12-09 NOTE — Telephone Encounter (Signed)
Advised patient per Dr Kumar:He needs to resume Ozempic 0.25 mg weekly for 2 shots and then 0.5 mg weekly.  Also start back on glimepiride 1 mg daily as before, needs to see me back in about a month with labs  ? ?Dr Dwyane Dee please add lab order. ?

## 2021-12-10 ENCOUNTER — Telehealth: Payer: Self-pay | Admitting: *Deleted

## 2021-12-10 NOTE — Telephone Encounter (Signed)
Faxed prescription for Lift Chair. Pt can get taxes taken off expense of chair with prescription ?

## 2021-12-11 DIAGNOSIS — R338 Other retention of urine: Secondary | ICD-10-CM | POA: Diagnosis not present

## 2021-12-12 NOTE — Telephone Encounter (Addendum)
0930: Form nurse en route to lobby informed this nurse of Tommy Yang spouse in lobby.  FMLA form completed 12/04/2021 needs to be changed to continuous leave. ? ?1025: Connected with Waldon Merl, "Section C no.# 11 needs to be changed to continuous leave and resent to [employer].  He is not doing well.  Use Monday 12/09/2021 as start date.  Do not know how much time I have available to use but they will let me know."    ? ?Section C, number 11 now reads "Continuous Leave 12/09/2021 through 03/10/2022.  Updated FMLA paperwork successfully faxed.  Original returned to Waldon Merl who currently denies further questions or needs.  ?

## 2021-12-13 ENCOUNTER — Other Ambulatory Visit: Payer: BC Managed Care – PPO

## 2021-12-13 ENCOUNTER — Ambulatory Visit: Payer: BC Managed Care – PPO | Admitting: Oncology

## 2021-12-17 DIAGNOSIS — R8271 Bacteriuria: Secondary | ICD-10-CM | POA: Diagnosis not present

## 2021-12-17 DIAGNOSIS — R338 Other retention of urine: Secondary | ICD-10-CM | POA: Diagnosis not present

## 2021-12-20 ENCOUNTER — Other Ambulatory Visit: Payer: Self-pay

## 2021-12-20 ENCOUNTER — Inpatient Hospital Stay: Payer: BC Managed Care – PPO | Attending: Oncology | Admitting: Oncology

## 2021-12-20 ENCOUNTER — Inpatient Hospital Stay: Payer: BC Managed Care – PPO

## 2021-12-20 VITALS — BP 87/58 | HR 88 | Temp 97.7°F | Resp 18 | Ht 71.0 in

## 2021-12-20 DIAGNOSIS — D649 Anemia, unspecified: Secondary | ICD-10-CM | POA: Diagnosis not present

## 2021-12-20 DIAGNOSIS — C61 Malignant neoplasm of prostate: Secondary | ICD-10-CM

## 2021-12-20 DIAGNOSIS — E669 Obesity, unspecified: Secondary | ICD-10-CM | POA: Diagnosis not present

## 2021-12-20 DIAGNOSIS — D630 Anemia in neoplastic disease: Secondary | ICD-10-CM

## 2021-12-20 DIAGNOSIS — T83511A Infection and inflammatory reaction due to indwelling urethral catheter, initial encounter: Secondary | ICD-10-CM | POA: Diagnosis not present

## 2021-12-20 DIAGNOSIS — R55 Syncope and collapse: Secondary | ICD-10-CM | POA: Diagnosis not present

## 2021-12-20 DIAGNOSIS — R0902 Hypoxemia: Secondary | ICD-10-CM | POA: Diagnosis not present

## 2021-12-20 DIAGNOSIS — Y92019 Unspecified place in single-family (private) house as the place of occurrence of the external cause: Secondary | ICD-10-CM | POA: Diagnosis not present

## 2021-12-20 DIAGNOSIS — S065XAA Traumatic subdural hemorrhage with loss of consciousness status unknown, initial encounter: Secondary | ICD-10-CM | POA: Diagnosis not present

## 2021-12-20 DIAGNOSIS — S06A0XA Traumatic brain compression without herniation, initial encounter: Secondary | ICD-10-CM | POA: Diagnosis not present

## 2021-12-20 DIAGNOSIS — C7951 Secondary malignant neoplasm of bone: Secondary | ICD-10-CM | POA: Diagnosis not present

## 2021-12-20 DIAGNOSIS — B961 Klebsiella pneumoniae [K. pneumoniae] as the cause of diseases classified elsewhere: Secondary | ICD-10-CM | POA: Diagnosis not present

## 2021-12-20 DIAGNOSIS — D62 Acute posthemorrhagic anemia: Secondary | ICD-10-CM | POA: Diagnosis not present

## 2021-12-20 DIAGNOSIS — R338 Other retention of urine: Secondary | ICD-10-CM | POA: Diagnosis not present

## 2021-12-20 DIAGNOSIS — N390 Urinary tract infection, site not specified: Secondary | ICD-10-CM | POA: Diagnosis not present

## 2021-12-20 DIAGNOSIS — T383X5A Adverse effect of insulin and oral hypoglycemic [antidiabetic] drugs, initial encounter: Secondary | ICD-10-CM | POA: Diagnosis present

## 2021-12-20 DIAGNOSIS — D696 Thrombocytopenia, unspecified: Secondary | ICD-10-CM | POA: Diagnosis not present

## 2021-12-20 DIAGNOSIS — I480 Paroxysmal atrial fibrillation: Secondary | ICD-10-CM | POA: Diagnosis present

## 2021-12-20 DIAGNOSIS — E872 Acidosis, unspecified: Secondary | ICD-10-CM | POA: Diagnosis not present

## 2021-12-20 DIAGNOSIS — E162 Hypoglycemia, unspecified: Secondary | ICD-10-CM | POA: Diagnosis not present

## 2021-12-20 DIAGNOSIS — W010XXA Fall on same level from slipping, tripping and stumbling without subsequent striking against object, initial encounter: Secondary | ICD-10-CM | POA: Diagnosis present

## 2021-12-20 DIAGNOSIS — E7800 Pure hypercholesterolemia, unspecified: Secondary | ICD-10-CM | POA: Diagnosis present

## 2021-12-20 DIAGNOSIS — S0101XA Laceration without foreign body of scalp, initial encounter: Secondary | ICD-10-CM | POA: Diagnosis not present

## 2021-12-20 DIAGNOSIS — E8770 Fluid overload, unspecified: Secondary | ICD-10-CM | POA: Diagnosis present

## 2021-12-20 DIAGNOSIS — N139 Obstructive and reflux uropathy, unspecified: Secondary | ICD-10-CM | POA: Diagnosis not present

## 2021-12-20 DIAGNOSIS — I10 Essential (primary) hypertension: Secondary | ICD-10-CM | POA: Diagnosis not present

## 2021-12-20 DIAGNOSIS — N179 Acute kidney failure, unspecified: Secondary | ICD-10-CM | POA: Diagnosis not present

## 2021-12-20 DIAGNOSIS — E161 Other hypoglycemia: Secondary | ICD-10-CM | POA: Diagnosis not present

## 2021-12-20 DIAGNOSIS — Z79899 Other long term (current) drug therapy: Secondary | ICD-10-CM | POA: Insufficient documentation

## 2021-12-20 DIAGNOSIS — K219 Gastro-esophageal reflux disease without esophagitis: Secondary | ICD-10-CM | POA: Diagnosis present

## 2021-12-20 DIAGNOSIS — Z7984 Long term (current) use of oral hypoglycemic drugs: Secondary | ICD-10-CM | POA: Diagnosis not present

## 2021-12-20 DIAGNOSIS — F952 Tourette's disorder: Secondary | ICD-10-CM | POA: Diagnosis present

## 2021-12-20 DIAGNOSIS — I499 Cardiac arrhythmia, unspecified: Secondary | ICD-10-CM | POA: Diagnosis not present

## 2021-12-20 DIAGNOSIS — Z7901 Long term (current) use of anticoagulants: Secondary | ICD-10-CM | POA: Diagnosis not present

## 2021-12-20 DIAGNOSIS — Z923 Personal history of irradiation: Secondary | ICD-10-CM | POA: Insufficient documentation

## 2021-12-20 DIAGNOSIS — R410 Disorientation, unspecified: Secondary | ICD-10-CM | POA: Diagnosis not present

## 2021-12-20 DIAGNOSIS — G935 Compression of brain: Secondary | ICD-10-CM | POA: Diagnosis not present

## 2021-12-20 DIAGNOSIS — I4891 Unspecified atrial fibrillation: Secondary | ICD-10-CM | POA: Diagnosis not present

## 2021-12-20 DIAGNOSIS — S299XXA Unspecified injury of thorax, initial encounter: Secondary | ICD-10-CM | POA: Diagnosis not present

## 2021-12-20 DIAGNOSIS — E11649 Type 2 diabetes mellitus with hypoglycemia without coma: Secondary | ICD-10-CM | POA: Diagnosis not present

## 2021-12-20 DIAGNOSIS — Z993 Dependence on wheelchair: Secondary | ICD-10-CM | POA: Insufficient documentation

## 2021-12-20 DIAGNOSIS — E871 Hypo-osmolality and hyponatremia: Secondary | ICD-10-CM | POA: Diagnosis not present

## 2021-12-20 DIAGNOSIS — S065X0A Traumatic subdural hemorrhage without loss of consciousness, initial encounter: Secondary | ICD-10-CM | POA: Diagnosis not present

## 2021-12-20 DIAGNOSIS — D6832 Hemorrhagic disorder due to extrinsic circulating anticoagulants: Secondary | ICD-10-CM | POA: Diagnosis not present

## 2021-12-20 NOTE — Progress Notes (Signed)
Hematology and Oncology Follow Up Visit ? ?Tommy Yang ?[id number] ?October 30, 1950 71 y.o. ?12/20/2021 3:22 PM ?Tommy Yang, MDJohn, Tommy Oris, MD  ? ?Principle Diagnosis: 71 year old with advanced prostate cancer with disease to the bone diagnosed in August 2022.  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 ? ? ?Prior Therapy: ? ?Radiation therapy with ADT completed in 2016. ? ?He developed a rise in his PSA in 2016 up to 2.03 in 2019.  Repeat biopsy showed Gleason score of 5+4 = 9 and 9 out of 12 cores including seminal vesicle involvement.  Pet imaging for staging purposes showed asymmetric activity within the left lobe of the prostate as well as solitary left external iliac lymph node with moderate radiotracer activity.  He resumed Lupron ADT at that time and subsequently switched to [place].   ? ?He had a good radiographic response in 2019 and underwent TURP procedure in 2021.  He continues to have persistent prostate cancer at that time.  His PSA started to rise on androgen deprivation in March 2021 to 0.18.  PSA was 1.0 in September 2021.  In April 2022 was 8.42.  In August 2022 was 70.0.  He developed metastatic disease to the bone at that time.  He was started [place] at that time. ? ? ? ?He is status post Xofigo for a total of 4 cycles.  Treatment was withheld because of worsening anemia. ? ? ?Current therapy: ? ?Antigen deprivation therapy under the care of Dr. Tresa Yang at [facility] ? ?Erleada 240 mg daily started in August 2022. ? ?Xgeva on a monthly basis under the care of Dr. Tresa Yang at [facility] urology ? ?Interim History: Tommy Yang is here for repeat evaluation.  Since last visit, he was hospitalized at the end of March 2023 for hypoglycemia and worsening anemia.  He was treated for UTI and transfused 1 unit of packed red cells.  Since his discharge, he reports no major changes in his health.  He denies any recent falls or  syncope.  He denies any worsening bone pain or pathological fractures.  His performance status quality of life remains unchanged.  He is limited in his mobility currently in a wheelchair for the most part.  He has a Foley catheter in place with the recurrent hematuria.  He does have generalized anasarca with upper extremity edema in addition to his mild lower extremity edema. ? ? ? ? ?Medications: Updated on review. ?Current Outpatient Medications  ?Medication Sig Dispense Refill  ? ACETAMINOPHEN PO Take 2 tablets by mouth daily as needed (pain).    ? amiodarone (PACERONE) 200 MG tablet Take 1 tablet (200 mg total) by mouth daily. 30 tablet 3  ? apalutamide (ERLEADA) 60 MG tablet Take 240 mg by mouth daily at 2 PM. May be taken with or without food. Swallow tablets whole.    ? atorvastatin (LIPITOR) 80 MG tablet TAKE 1 TABLET BY MOUTH EVERY DAY (Patient taking differently: Take 80 mg by mouth every morning.) 90 tablet 0  ? blood glucose meter kit and supplies KIT Test blood sugar daily as directed. Dx code: E11.9 1 each 0  ? CALCIUM PO Take 1 tablet by mouth at bedtime.    ? Cholecalciferol (VITAMIN D3 PO) Take 2 capsules by mouth at bedtime.    ? Cyanocobalamin (VITAMIN B 12 PO) Take 1 tablet by mouth at bedtime.    ? diphenoxylate-atropine (LOMOTIL) 2.5-0.025 MG tablet TAKE 1 TABLET  BY MOUTH 4 (FOUR) TIMES DAILY AS NEEDED FOR DIARRHEA OR LOOSE STOOLS. (Patient taking differently: Take 1 tablet by mouth 3 (three) times daily.) 40 tablet 1  ? Empagliflozin-metFORMIN HCl ER (SYNJARDY XR) 12.12-998 MG TB24 TAKE 2 TABLETS BY MOUTH EVERY DAY 180 tablet 3  ? folic acid (FOLVITE) 1 MG tablet Take 1 tablet (1 mg total) by mouth daily. 90 tablet 0  ? furosemide (LASIX) 40 MG tablet Take 1 tablet (40 mg total) by mouth daily. (Patient taking differently: Take 40 mg by mouth daily as needed for fluid or edema.) 30 tablet 0  ? glucose blood (ACCU-CHEK GUIDE) test strip Use as instructed to check blood sugar twice daily. 100  each 3  ? leuprolide (LUPRON) 11.25 MG injection Inject 11.25 mg into the muscle every 6 (six) months.    ? metoprolol tartrate (LOPRESSOR) 100 MG tablet Take 0.5 tablets (50 mg total) by mouth 2 (two) times daily for 1 day, THEN 1 tablet (100 mg total) 2 (two) times daily for 1 day. 180 tablet 3  ? Multiple Vitamins-Minerals (ZINC PO) Take 1 tablet by mouth at bedtime.    ? pantoprazole (PROTONIX) 40 MG tablet TAKE 1 TABLET BY MOUTH EVERY DAY (Patient taking differently: 40 mg every morning.) 90 tablet 3  ? Semaglutide,0.25 or 0.5MG/DOS, (OZEMPIC, 0.25 OR 0.5 MG/DOSE,) 2 MG/1.5ML SOPN Inject 0.5 mg into the skin once a week. (Patient taking differently: Inject 0.5 mg into the skin every Friday.) 4.5 mL 2  ? tamsulosin (FLOMAX) 0.4 MG CAPS capsule Take 1 capsule (0.4 mg total) by mouth 2 (two) times daily.    ? warfarin (COUMADIN) 5 MG tablet Take as directed by anticoagulation clinic 30 tablet 0  ? ?No current facility-administered medications for this visit.  ? ? ? ?Allergies:  ?Allergies  ?Allergen Reactions  ? Shingrix [Zoster Vac Recomb Adjuvanted] Shortness Of Breath and Other (See Comments)  ?  Pulse increased to 170+ and it affected the A-Fib  ? ? ? ?Physical Exam: ? ?Blood pressure (!) 87/58, pulse 88, temperature 97.7 ?F (36.5 ?C), temperature source Temporal, resp. rate 18, height _0  (1.803 m), SpO2 98 %. ? ?ECOG: 1 ? ? ? ?General appearance: Alert, awake without any distress. ?Head: Atraumatic without abnormalities ?Oropharynx: Without any thrush or ulcers. ?Eyes: No scleral icterus. ?Lymph nodes: No lymphadenopathy noted in the cervical, supraclavicular, or axillary nodes ?Heart:regular rate and rhythm, without any murmurs or gallops.   ?Lung: Clear to auscultation without any rhonchi, wheezes or dullness to percussion. ?Abdomin: Soft, nontender without any shifting dullness or ascites. ?Musculoskeletal: No clubbing or cyanosis. ?Neurological: No motor or sensory deficits. ?Skin: No rashes or  lesions. ? ? ? ? ? ?Lab Results: ?Lab Results  ?Component Value Date  ? WBC 7.0 11/28/2021  ? HGB 8.0 (L) 11/28/2021  ? HCT 26.7 (L) 11/28/2021  ? MCV 93.4 11/28/2021  ? PLT 91 (L) 11/28/2021  ? ?  Chemistry   ?   ?Component Value Date/Time  ? NA 139 11/28/2021 0406  ? NA 142 05/12/2018 0853  ? K 4.1 11/28/2021 0406  ? CL 110 11/28/2021 0406  ? CO2 24 11/28/2021 0406  ? BUN 19 11/28/2021 0406  ? BUN 23 05/12/2018 0853  ? CREATININE 0.64 11/28/2021 0406  ? CREATININE 1.17 05/18/2020 0809  ?    ?Component Value Date/Time  ? CALCIUM 8.5 (L) 11/28/2021 0406  ? ALKPHOS 79 11/25/2021 1917  ? AST 17 11/25/2021 1917  ? ALT 22 11/25/2021 1917  ?  BILITOT <0.1 (L) 11/25/2021 1917  ?  ? ? ?PSA was 35 on March 2023 ? ?Impression and Plan: ? ? ?71 year old man with: ? ?1.  Castration-resistant advanced prostate cancer with disease to the bone documented in 2022. ? ?He is currently on Erleada without any major complications.  His PSA in March 2023 showed a reasonable decline from 48.2 in February 2023.  At this time, I recommended continuing his current therapy without any changes.  Alternative treatment options including Taxotere chemotherapy will be deferred at this time.  He would be a marginal candidate for chemotherapy.  He is agreeable to continue at this time. ? ?2.  Anemia: His work-up has been unrevealing with a likely etiology is related to malignancy and cancer treatment.  His hemoglobin was 8.0 upon his discharge on March 30.  He does not require any additional transfusion at this time. ? ? ? ? ?3.  Androgen deprivation therapy: He continues to receive that under the care of Dr. Tresa Yang ? ?4.  Bone directed therapy: He is currently on Xgeva and he will continue to receive that under the care of Dr. Tresa Yang. ?  ?5.  Follow-up: He will return in 4 months for repeat follow-up. ?  ?  ?30  minutes were spent on this encounter.  The time was dedicated to reviewing laboratory data, disease status update, treatment choices and  complication related to therapy. ?  ? ? ?Zola Button, MD ?4/21/20233:22 PM ? ?

## 2021-12-23 ENCOUNTER — Emergency Department (HOSPITAL_COMMUNITY): Payer: BC Managed Care – PPO

## 2021-12-23 ENCOUNTER — Inpatient Hospital Stay (HOSPITAL_COMMUNITY)
Admission: EM | Admit: 2021-12-23 | Discharge: 2021-12-26 | DRG: 085 | Disposition: A | Discharge status: Home Health | Payer: BC Managed Care – PPO | Attending: Internal Medicine | Admitting: Internal Medicine

## 2021-12-23 ENCOUNTER — Encounter (HOSPITAL_COMMUNITY): Payer: Self-pay | Admitting: Emergency Medicine

## 2021-12-23 ENCOUNTER — Other Ambulatory Visit: Payer: Self-pay

## 2021-12-23 DIAGNOSIS — K219 Gastro-esophageal reflux disease without esophagitis: Secondary | ICD-10-CM | POA: Diagnosis present

## 2021-12-23 DIAGNOSIS — Z79899 Other long term (current) drug therapy: Secondary | ICD-10-CM

## 2021-12-23 DIAGNOSIS — E872 Acidosis, unspecified: Secondary | ICD-10-CM | POA: Diagnosis present

## 2021-12-23 DIAGNOSIS — Z7984 Long term (current) use of oral hypoglycemic drugs: Secondary | ICD-10-CM

## 2021-12-23 DIAGNOSIS — B961 Klebsiella pneumoniae [K. pneumoniae] as the cause of diseases classified elsewhere: Secondary | ICD-10-CM | POA: Diagnosis present

## 2021-12-23 DIAGNOSIS — T83511A Infection and inflammatory reaction due to indwelling urethral catheter, initial encounter: Secondary | ICD-10-CM | POA: Diagnosis present

## 2021-12-23 DIAGNOSIS — S0101XA Laceration without foreign body of scalp, initial encounter: Secondary | ICD-10-CM | POA: Diagnosis present

## 2021-12-23 DIAGNOSIS — Z887 Allergy status to serum and vaccine status: Secondary | ICD-10-CM

## 2021-12-23 DIAGNOSIS — R55 Syncope and collapse: Principal | ICD-10-CM

## 2021-12-23 DIAGNOSIS — Z825 Family history of asthma and other chronic lower respiratory diseases: Secondary | ICD-10-CM

## 2021-12-23 DIAGNOSIS — E8770 Fluid overload, unspecified: Secondary | ICD-10-CM | POA: Diagnosis present

## 2021-12-23 DIAGNOSIS — T383X5A Adverse effect of insulin and oral hypoglycemic [antidiabetic] drugs, initial encounter: Secondary | ICD-10-CM | POA: Diagnosis present

## 2021-12-23 DIAGNOSIS — E162 Hypoglycemia, unspecified: Secondary | ICD-10-CM

## 2021-12-23 DIAGNOSIS — S065XAA Traumatic subdural hemorrhage with loss of consciousness status unknown, initial encounter: Secondary | ICD-10-CM | POA: Diagnosis present

## 2021-12-23 DIAGNOSIS — E871 Hypo-osmolality and hyponatremia: Secondary | ICD-10-CM | POA: Diagnosis present

## 2021-12-23 DIAGNOSIS — Y92019 Unspecified place in single-family (private) house as the place of occurrence of the external cause: Secondary | ICD-10-CM | POA: Diagnosis not present

## 2021-12-23 DIAGNOSIS — I10 Essential (primary) hypertension: Secondary | ICD-10-CM | POA: Diagnosis present

## 2021-12-23 DIAGNOSIS — D62 Acute posthemorrhagic anemia: Secondary | ICD-10-CM | POA: Diagnosis present

## 2021-12-23 DIAGNOSIS — D6832 Hemorrhagic disorder due to extrinsic circulating anticoagulants: Secondary | ICD-10-CM | POA: Diagnosis present

## 2021-12-23 DIAGNOSIS — Z833 Family history of diabetes mellitus: Secondary | ICD-10-CM

## 2021-12-23 DIAGNOSIS — Z8249 Family history of ischemic heart disease and other diseases of the circulatory system: Secondary | ICD-10-CM

## 2021-12-23 DIAGNOSIS — Z8349 Family history of other endocrine, nutritional and metabolic diseases: Secondary | ICD-10-CM

## 2021-12-23 DIAGNOSIS — C61 Malignant neoplasm of prostate: Secondary | ICD-10-CM | POA: Diagnosis present

## 2021-12-23 DIAGNOSIS — Z86718 Personal history of other venous thrombosis and embolism: Secondary | ICD-10-CM

## 2021-12-23 DIAGNOSIS — S06A0XA Traumatic brain compression without herniation, initial encounter: Secondary | ICD-10-CM | POA: Diagnosis present

## 2021-12-23 DIAGNOSIS — W010XXA Fall on same level from slipping, tripping and stumbling without subsequent striking against object, initial encounter: Secondary | ICD-10-CM | POA: Diagnosis present

## 2021-12-23 DIAGNOSIS — S065X0A Traumatic subdural hemorrhage without loss of consciousness, initial encounter: Principal | ICD-10-CM | POA: Diagnosis present

## 2021-12-23 DIAGNOSIS — Z87891 Personal history of nicotine dependence: Secondary | ICD-10-CM

## 2021-12-23 DIAGNOSIS — D500 Iron deficiency anemia secondary to blood loss (chronic): Secondary | ICD-10-CM

## 2021-12-23 DIAGNOSIS — I959 Hypotension, unspecified: Secondary | ICD-10-CM | POA: Diagnosis present

## 2021-12-23 DIAGNOSIS — I480 Paroxysmal atrial fibrillation: Secondary | ICD-10-CM | POA: Diagnosis present

## 2021-12-23 DIAGNOSIS — E11649 Type 2 diabetes mellitus with hypoglycemia without coma: Secondary | ICD-10-CM | POA: Diagnosis present

## 2021-12-23 DIAGNOSIS — C7951 Secondary malignant neoplasm of bone: Secondary | ICD-10-CM | POA: Diagnosis present

## 2021-12-23 DIAGNOSIS — E7800 Pure hypercholesterolemia, unspecified: Secondary | ICD-10-CM | POA: Diagnosis present

## 2021-12-23 DIAGNOSIS — Z79890 Hormone replacement therapy (postmenopausal): Secondary | ICD-10-CM

## 2021-12-23 DIAGNOSIS — D696 Thrombocytopenia, unspecified: Secondary | ICD-10-CM | POA: Diagnosis present

## 2021-12-23 DIAGNOSIS — E669 Obesity, unspecified: Secondary | ICD-10-CM | POA: Diagnosis present

## 2021-12-23 DIAGNOSIS — R946 Abnormal results of thyroid function studies: Secondary | ICD-10-CM | POA: Diagnosis present

## 2021-12-23 DIAGNOSIS — F952 Tourette's disorder: Secondary | ICD-10-CM | POA: Diagnosis present

## 2021-12-23 DIAGNOSIS — I4891 Unspecified atrial fibrillation: Secondary | ICD-10-CM

## 2021-12-23 DIAGNOSIS — Z6831 Body mass index (BMI) 31.0-31.9, adult: Secondary | ICD-10-CM

## 2021-12-23 DIAGNOSIS — R600 Localized edema: Secondary | ICD-10-CM | POA: Diagnosis present

## 2021-12-23 DIAGNOSIS — Z9079 Acquired absence of other genital organ(s): Secondary | ICD-10-CM

## 2021-12-23 DIAGNOSIS — Z923 Personal history of irradiation: Secondary | ICD-10-CM

## 2021-12-23 DIAGNOSIS — N390 Urinary tract infection, site not specified: Secondary | ICD-10-CM | POA: Diagnosis present

## 2021-12-23 DIAGNOSIS — R319 Hematuria, unspecified: Secondary | ICD-10-CM | POA: Diagnosis present

## 2021-12-23 DIAGNOSIS — M1990 Unspecified osteoarthritis, unspecified site: Secondary | ICD-10-CM | POA: Diagnosis present

## 2021-12-23 DIAGNOSIS — T383X1A Poisoning by insulin and oral hypoglycemic [antidiabetic] drugs, accidental (unintentional), initial encounter: Secondary | ICD-10-CM | POA: Diagnosis present

## 2021-12-23 DIAGNOSIS — T45515A Adverse effect of anticoagulants, initial encounter: Secondary | ICD-10-CM | POA: Diagnosis present

## 2021-12-23 DIAGNOSIS — R197 Diarrhea, unspecified: Secondary | ICD-10-CM | POA: Diagnosis present

## 2021-12-23 DIAGNOSIS — E160 Drug-induced hypoglycemia without coma: Secondary | ICD-10-CM | POA: Diagnosis present

## 2021-12-23 LAB — URINALYSIS, MICROSCOPIC (REFLEX): WBC, UA: >50 WBC/hpf (ref 0–5)

## 2021-12-23 LAB — URINALYSIS, ROUTINE W REFLEX MICROSCOPIC

## 2021-12-23 LAB — CBC
HCT: 22 % — ABNORMAL LOW (ref 39.0–52.0)
HCT: 22.1 % — ABNORMAL LOW (ref 39.0–52.0)
HCT: 24.3 % — ABNORMAL LOW (ref 39.0–52.0)
Hemoglobin: 6.6 g/dL — CL (ref 13.0–17.0)
Hemoglobin: 6.8 g/dL — CL (ref 13.0–17.0)
Hemoglobin: 7.8 g/dL — ABNORMAL LOW (ref 13.0–17.0)
MCH: 28.7 pg (ref 26.0–34.0)
MCH: 28.9 pg (ref 26.0–34.0)
MCH: 30.1 pg (ref 26.0–34.0)
MCHC: 30 g/dL (ref 30.0–36.0)
MCHC: 30.8 g/dL (ref 30.0–36.0)
MCHC: 32.1 g/dL (ref 30.0–36.0)
MCV: 95.7 fL (ref 80.0–100.0)
MCV: 94 fL (ref 80.0–100.0)
MCV: 93.8 fL (ref 80.0–100.0)
Platelets: 52 10*3/uL — ABNORMAL LOW (ref 150–400)
Platelets: 53 10*3/uL — ABNORMAL LOW (ref 150–400)
Platelets: 50 10*3/uL — ABNORMAL LOW (ref 150–400)
RBC: 2.3 MIL/uL — ABNORMAL LOW (ref 4.22–5.81)
RBC: 2.35 MIL/uL — ABNORMAL LOW (ref 4.22–5.81)
RBC: 2.59 MIL/uL — ABNORMAL LOW (ref 4.22–5.81)
RDW: 21.9 % — ABNORMAL HIGH (ref 11.5–15.5)
RDW: 21.7 % — ABNORMAL HIGH (ref 11.5–15.5)
RDW: 21.1 % — ABNORMAL HIGH (ref 11.5–15.5)
WBC: 5.9 10*3/uL (ref 4.0–10.5)
WBC: 5.9 10*3/uL (ref 4.0–10.5)
WBC: 5.2 10*3/uL (ref 4.0–10.5)
nRBC: 0.3 % — ABNORMAL HIGH (ref 0.0–0.2)
nRBC: 0 % (ref 0.0–0.2)
nRBC: 0 % (ref 0.0–0.2)

## 2021-12-23 LAB — CBC WITH DIFFERENTIAL/PLATELET
Abs Immature Granulocytes: 0.15 10*3/uL — ABNORMAL HIGH (ref 0.00–0.07)
Basophils Absolute: 0 10*3/uL (ref 0.0–0.1)
Basophils Relative: 0 %
Eosinophils Absolute: 0 10*3/uL (ref 0.0–0.5)
Eosinophils Relative: 0 %
HCT: 24.6 % — ABNORMAL LOW (ref 39.0–52.0)
Hemoglobin: 7 g/dL — ABNORMAL LOW (ref 13.0–17.0)
Immature Granulocytes: 2 %
Lymphocytes Relative: 9 %
Lymphs Abs: 0.7 10*3/uL (ref 0.7–4.0)
MCH: 28.3 pg (ref 26.0–34.0)
MCHC: 28.5 g/dL — ABNORMAL LOW (ref 30.0–36.0)
MCV: 99.6 fL (ref 80.0–100.0)
Monocytes Absolute: 0.6 10*3/uL (ref 0.1–1.0)
Monocytes Relative: 8 %
Neutro Abs: 6.5 10*3/uL (ref 1.7–7.7)
Neutrophils Relative %: 81 %
Platelets: 69 10*3/uL — ABNORMAL LOW (ref 150–400)
RBC: 2.47 MIL/uL — ABNORMAL LOW (ref 4.22–5.81)
RDW: 22.6 % — ABNORMAL HIGH (ref 11.5–15.5)
Smear Review: DECREASED
WBC: 8.1 10*3/uL (ref 4.0–10.5)
nRBC: 0.2 % (ref 0.0–0.2)

## 2021-12-23 LAB — ETHANOL: Alcohol, Ethyl (B): <10 mg/dL (ref <10)

## 2021-12-23 LAB — GLUCOSE, CAPILLARY
Glucose-Capillary: 59 mg/dL — ABNORMAL LOW (ref 70–99)
Glucose-Capillary: 83 mg/dL (ref 70–99)
Glucose-Capillary: 29 mg/dL — CL (ref 70–99)
Glucose-Capillary: 37 mg/dL — CL (ref 70–99)
Glucose-Capillary: 51 mg/dL — ABNORMAL LOW (ref 70–99)
Glucose-Capillary: 77 mg/dL (ref 70–99)
Glucose-Capillary: 58 mg/dL — ABNORMAL LOW (ref 70–99)
Glucose-Capillary: 76 mg/dL (ref 70–99)
Glucose-Capillary: 83 mg/dL (ref 70–99)
Glucose-Capillary: 116 mg/dL — ABNORMAL HIGH (ref 70–99)

## 2021-12-23 LAB — CBG MONITORING, ED
Glucose-Capillary: 33 mg/dL — CL (ref 70–99)
Glucose-Capillary: 47 mg/dL — ABNORMAL LOW (ref 70–99)
Glucose-Capillary: 106 mg/dL — ABNORMAL HIGH (ref 70–99)

## 2021-12-23 LAB — I-STAT CHEM 8, ED
BUN: 21 mg/dL (ref 8–23)
Calcium, Ion: 1.15 mmol/L (ref 1.15–1.40)
Chloride: 104 mmol/L (ref 98–111)
Creatinine, Ser: 0.6 mg/dL — ABNORMAL LOW (ref 0.61–1.24)
Glucose, Bld: 35 mg/dL — CL (ref 70–99)
HCT: 24 % — ABNORMAL LOW (ref 39.0–52.0)
Hemoglobin: 8.2 g/dL — ABNORMAL LOW (ref 13.0–17.0)
Potassium: 4.2 mmol/L (ref 3.5–5.1)
Sodium: 133 mmol/L — ABNORMAL LOW (ref 135–145)
TCO2: 19 mmol/L — ABNORMAL LOW (ref 22–32)

## 2021-12-23 LAB — COMPREHENSIVE METABOLIC PANEL
ALT: 14 U/L (ref 0–44)
ALT: 13 U/L (ref 0–44)
AST: 15 U/L (ref 15–41)
AST: 14 U/L — ABNORMAL LOW (ref 15–41)
Albumin: <1.5 g/dL — ABNORMAL LOW (ref 3.5–5.0)
Albumin: <1.5 g/dL — ABNORMAL LOW (ref 3.5–5.0)
Alkaline Phosphatase: 112 U/L (ref 38–126)
Alkaline Phosphatase: 107 U/L (ref 38–126)
Anion gap: 8 (ref 5–15)
Anion gap: 6 (ref 5–15)
BUN: 19 mg/dL (ref 8–23)
BUN: 18 mg/dL (ref 8–23)
CO2: 18 mmol/L — ABNORMAL LOW (ref 22–32)
CO2: 19 mmol/L — ABNORMAL LOW (ref 22–32)
Calcium: 8.1 mg/dL — ABNORMAL LOW (ref 8.9–10.3)
Calcium: 7.6 mg/dL — ABNORMAL LOW (ref 8.9–10.3)
Chloride: 106 mmol/L (ref 98–111)
Chloride: 107 mmol/L (ref 98–111)
Creatinine, Ser: 0.8 mg/dL (ref 0.61–1.24)
Creatinine, Ser: 0.77 mg/dL (ref 0.61–1.24)
GFR, Estimated: >60 mL/min (ref >60)
GFR, Estimated: >60 mL/min (ref >60)
Glucose, Bld: 54 mg/dL — ABNORMAL LOW (ref 70–99)
Glucose, Bld: 48 mg/dL — ABNORMAL LOW (ref 70–99)
Potassium: 4.1 mmol/L (ref 3.5–5.1)
Potassium: 3.9 mmol/L (ref 3.5–5.1)
Sodium: 132 mmol/L — ABNORMAL LOW (ref 135–145)
Sodium: 132 mmol/L — ABNORMAL LOW (ref 135–145)
Total Bilirubin: 0.5 mg/dL (ref 0.3–1.2)
Total Bilirubin: 0.5 mg/dL (ref 0.3–1.2)
Total Protein: 5.8 g/dL — ABNORMAL LOW (ref 6.5–8.1)
Total Protein: 5.1 g/dL — ABNORMAL LOW (ref 6.5–8.1)

## 2021-12-23 LAB — PREPARE RBC (CROSSMATCH)

## 2021-12-23 LAB — MRSA NEXT GEN BY PCR, NASAL: MRSA by PCR Next Gen: NOT DETECTED

## 2021-12-23 LAB — PROTIME-INR
INR: 6.6 (ref 0.8–1.2)
INR: 1.3 — ABNORMAL HIGH (ref 0.8–1.2)
Prothrombin Time: 57 seconds — ABNORMAL HIGH (ref 11.4–15.2)
Prothrombin Time: 16.4 seconds — ABNORMAL HIGH (ref 11.4–15.2)

## 2021-12-23 LAB — APTT: aPTT: 43 seconds — ABNORMAL HIGH (ref 24–36)

## 2021-12-23 LAB — HEMOGLOBIN AND HEMATOCRIT, BLOOD
HCT: 22.7 % — ABNORMAL LOW (ref 39.0–52.0)
Hemoglobin: 7.2 g/dL — ABNORMAL LOW (ref 13.0–17.0)

## 2021-12-23 LAB — MAGNESIUM: Magnesium: 1.4 mg/dL — ABNORMAL LOW (ref 1.7–2.4)

## 2021-12-23 MED ORDER — ATORVASTATIN CALCIUM 80 MG PO TABS
80.0000 mg | ORAL_TABLET | Freq: Every day | ORAL | Status: DC
  Administered 2021-12-23 – 2021-12-26 (×4): 80 mg via ORAL
  Filled 2021-12-23 (×4): qty 1

## 2021-12-23 MED ORDER — DEXTROSE 50 % IV SOLN
INTRAVENOUS | Status: AC
  Administered 2021-12-23: 50 mL
  Filled 2021-12-23: qty 50

## 2021-12-23 MED ORDER — LOPERAMIDE HCL 2 MG PO CAPS
4.0000 mg | ORAL_CAPSULE | Freq: Four times a day (QID) | ORAL | Status: DC | PRN
  Administered 2021-12-23 – 2021-12-24 (×2): 4 mg via ORAL
  Filled 2021-12-23 (×4): qty 2

## 2021-12-23 MED ORDER — SODIUM CHLORIDE 0.9 % IV SOLN: 250.0000 mL | INTRAVENOUS | Status: DC | PRN

## 2021-12-23 MED ORDER — DOCUSATE SODIUM 100 MG PO CAPS
100.0000 mg | ORAL_CAPSULE | Freq: Two times a day (BID) | ORAL | Status: DC | PRN
Start: 2021-12-23 — End: 2021-12-26
  Filled 2021-12-23: qty 1

## 2021-12-23 MED ORDER — SODIUM CHLORIDE 0.9% FLUSH: 3.0000 mL | INTRAVENOUS | Status: DC | PRN

## 2021-12-23 MED ORDER — OCTREOTIDE ACETATE 50 MCG/ML IJ SOLN
50.0000 ug | Freq: Once | INTRAMUSCULAR | Status: AC
  Administered 2021-12-23: 50 ug via SUBCUTANEOUS
  Filled 2021-12-23 (×2): qty 1

## 2021-12-23 MED ORDER — POLYETHYLENE GLYCOL 3350 17 G PO PACK: 17.0000 g | PACK | Freq: Every day | ORAL | Status: DC | PRN

## 2021-12-23 MED ORDER — SODIUM CHLORIDE 0.9 % IV SOLN
1.0000 g | INTRAVENOUS | Status: DC
  Administered 2021-12-23 – 2021-12-24 (×2): 1 g via INTRAVENOUS
  Filled 2021-12-23 (×2): qty 10

## 2021-12-23 MED ORDER — SODIUM CHLORIDE 0.9% FLUSH
3.0000 mL | Freq: Two times a day (BID) | INTRAVENOUS | Status: DC
  Administered 2021-12-23 – 2021-12-25 (×5): 3 mL via INTRAVENOUS

## 2021-12-23 MED ORDER — AMIODARONE HCL 200 MG PO TABS
200.0000 mg | ORAL_TABLET | Freq: Every day | ORAL | Status: DC
Start: 2021-12-23 — End: 2021-12-24
  Administered 2021-12-23 – 2021-12-24 (×2): 200 mg via ORAL
  Filled 2021-12-23 (×2): qty 1

## 2021-12-23 MED ORDER — MAGNESIUM SULFATE 2 GM/50ML IV SOLN
2.0000 g | Freq: Once | INTRAVENOUS | Status: AC
  Administered 2021-12-23: 2 g via INTRAVENOUS
  Filled 2021-12-23: qty 50

## 2021-12-23 MED ORDER — PANTOPRAZOLE SODIUM 40 MG PO TBEC
40.0000 mg | DELAYED_RELEASE_TABLET | Freq: Every day | ORAL | Status: DC
  Administered 2021-12-23 – 2021-12-26 (×4): 40 mg via ORAL
  Filled 2021-12-23 (×4): qty 1

## 2021-12-23 MED ORDER — CHLORHEXIDINE GLUCONATE CLOTH 2 % EX PADS
6.0000 | MEDICATED_PAD | Freq: Every day | CUTANEOUS | Status: DC
  Administered 2021-12-23 – 2021-12-24 (×2): 6 via TOPICAL

## 2021-12-23 MED ORDER — DEXTROSE 10 % IV SOLN: INTRAVENOUS | Status: DC

## 2021-12-23 MED ORDER — DEXTROSE 50 % IV SOLN
1.0000 | Freq: Once | INTRAVENOUS | Status: AC
  Administered 2021-12-23: 50 mL via INTRAVENOUS

## 2021-12-23 MED ORDER — PROTHROMBIN COMPLEX CONC HUMAN 500 UNITS IV KIT
4776.0000 [IU] | PACK | Status: AC
  Administered 2021-12-23: 4776 [IU] via INTRAVENOUS
  Filled 2021-12-23: qty 4000

## 2021-12-23 MED ORDER — DEXTROSE 10 % IV SOLN
100.0000 mL | Freq: Once | INTRAVENOUS | Status: AC
  Administered 2021-12-23: 100 mL via INTRAVENOUS

## 2021-12-23 MED ORDER — TAMSULOSIN HCL 0.4 MG PO CAPS
0.4000 mg | ORAL_CAPSULE | Freq: Two times a day (BID) | ORAL | Status: DC
  Administered 2021-12-23 – 2021-12-26 (×7): 0.4 mg via ORAL
  Filled 2021-12-23 (×7): qty 1

## 2021-12-23 MED ORDER — SODIUM CHLORIDE 0.9 % IV SOLN: 10.0000 mL/h | Freq: Once | INTRAVENOUS | Status: DC

## 2021-12-23 MED ORDER — DEXTROSE 50 % IV SOLN
12.5000 g | INTRAVENOUS | Status: DC
  Filled 2021-12-23: qty 50

## 2021-12-23 MED ORDER — VITAMIN K1 10 MG/ML IJ SOLN
10.0000 mg | INTRAVENOUS | Status: AC
  Administered 2021-12-23: 10 mg via INTRAVENOUS
  Filled 2021-12-23: qty 1

## 2021-12-23 MED ORDER — ACETAMINOPHEN 325 MG PO TABS: 650.0000 mg | ORAL_TABLET | ORAL | Status: DC | PRN

## 2021-12-23 MED ORDER — SODIUM CHLORIDE 0.9% IV SOLUTION: Freq: Once | INTRAVENOUS | Status: DC

## 2021-12-23 NOTE — ED Notes (Signed)
Pt verbally consented to blood transfusion. With pt permission, pt's wife at bedside signed electronic blood consent form (available in chart). ?

## 2021-12-23 NOTE — ED Notes (Signed)
Patient transported to CT 

## 2021-12-23 NOTE — ED Notes (Signed)
Breakfast order placed ?

## 2021-12-23 NOTE — ED Notes (Signed)
Pharmacist delivered [place] and vitamin k infusion in-person to this RN. Per pharmacist's instruction, will infuse Tommy Yang first and then vitamin k infusion second.  ?

## 2021-12-23 NOTE — ED Notes (Signed)
RN informed ED provider of pt's critical INR value of 6.6 ?

## 2021-12-23 NOTE — Progress Notes (Signed)
eLink Physician-Brief Progress Note ?Patient Name: Tommy Yang ?DOB: 11/19/1950 ?MRN: [id number] ? ? ?Date of Service ? 12/23/2021  ?HPI/Events of Note ? Patient with recurrent hypoglycemia.  ?eICU Interventions ? CBG Q 1 hour x 6 then Q 4 hours if stable ( bedside RN to notify E-Link if blood sugar remains unstable), D 10 % gtt increased to 125 ml / hour.  ? ? ? ?  ? ?Kerry Kass Julann Mcgilvray ?12/23/2021, 10:21 PM ?

## 2021-12-23 NOTE — ED Triage Notes (Signed)
Pt bib EMS from home as Level 2 trauma, fall on blood thinners. Stood up from recliner and had witnessed fall and hit head, no LOC. Takes warfarin. Initially only responsive to pain. Initial CBG 32, dextrose 10 given. CBG increased to 124 and pt more responsive after receving dextrose 10. Pt also seen in fib RVR, HR 170-180. '10mg'$  cardizem given en route.  ? ?140/70 ?97% room air ? ?18G IV in R AC ?

## 2021-12-23 NOTE — Progress Notes (Signed)
Date and time results received: 12/23/21 5:24 PM ? ?(use smartphrase ".now" to insert current time) ? ?Test: Hemoglobin ?Critical Value: 6.8 ? ?Name of Provider Notified: I notified Alroy Dust, RN (patient's primary nurse) at this time and he will speak to Dr. Lynetta Mare ?

## 2021-12-23 NOTE — H&P (Signed)
? ?NAME:  Tommy Yang, MRN:  [id number], DOB:  1951/07/19, LOS: 0 ?ADMISSION DATE:  12/23/2021, CONSULTATION DATE:  12/23/21 ?REFERRING MD:  ED, CHIEF COMPLAINT:  fall from standing, weaknesss  ? ?History of Present Illness:  ?71 yo man with hx of prostate cancer, DM, afib with RVR, here after a fall from standing due to weakness.  ?Stood from his recliner and suddenly became weak, legs gave out.  Fell and hit head on speaker, did not lose consciousness.  ?Was initially only responsive to pain, now back to normal, alert and oriented.   ?Found to be hypoglycemic, ?Tachy 170s, given cardizem 10 prior to arrrival.  ? ?Had recently had foley obstruction, was changed out at urology office about 2 weeks ago.  Bloody tinged urine since then.  Dark/brown urine. Persistent.   ?Recently started on UTI tx with augmentin on Friday 3 days treatment completed.   ?Klebsiella on cultures.   ? ?1m Subdural hematoma, right cerebral convexity. Trace subarachnoid . L post scalp lac.  ? ?Inr 6.6 ? ?In ed started D10 infusion, vitamin K ordered, kcentra given.  ? ?Mild met acidosis, increased anion gap from baseline ?Thrombocytopenia ?Anemia (chronic) normocytic  ? ?Tommy Mayerfor prostate cancer, Xgeva monthly.  Completed xofigo for 4 cycles (finshed Jan or Feb 2023) ?Pertinent  Medical History  ?Prostate ca 2016 dx 8/22 bone mets ?Erleada, s.p 4 cycles xofigo, xygeva monthly  ?Hld ?Dm2 ?DVT hx  ?Chronic folwy  ?Tourettes syndrome ?Urinary retention ?Afib on coumadin, amio ? ?Significant Hospital Events: ?Including procedures, antibiotic start and stop dates in addition to other pertinent events   ? ? ?Interim History / Subjective:  ? ?Objective   ?Blood pressure 97/64, pulse (!) 120, temperature 98.8 ?F (37.1 ?C), temperature source Oral, resp. rate 10, height '5\' 11"'  (1.803 m), weight 103.9 kg, SpO2 100 %. ?   ?   ? ?Intake/Output Summary (Last 24 hours) at 12/23/2021 00034?Last data filed at 12/23/2021 0609 ?Gross per 24 hour   ?Intake 150 ml  ?Output 0 ml  ?Net 150 ml  ? ?Filed Weights  ? 12/23/21 0445  ?Weight: 103.9 kg  ? ? ?Examination: ?General: NAD, pleasant, pallor  ?HENT: ncat,slightly dry mm ?Lungs: CTAB ?Cardiovascular: tachycardic  ?Abdomen: nt, nd, nbs  ?Extremities: 2+ pitting edema to knees.  ?Neuro: alert and oriented x 3  ?GU: foley in place  ? ?CXR:  ?IMPRESSION: ?Hazy density at the bases, likely small volume pleural fluid and ?atelectasis. ?Resolved Hospital Problem list   ? ? ?Assessment & Plan:  ?71yo man with hx of prostate cancer, DM2, Afib,. Here with fall, sdh, hypoglycemia.  ?Fall, weakness: likely 2/2 hypoglycema vs hypotension in the setting of afib and tachycardia vs infection related.  ? Blood and urine cultures. Empiric abtx for UTI (already on 3 d augmentin for klebsiella UTI).   ? ?2. SDH: neurosurg consulted.  Non surgical management rec.  ?Reversal of warfarin (k centra , vit K). ?Neuro checks, ICU ? ?3. Hematuria, UTI: re peat UA.  Urology consult needed.  ? ?4. Hypoglycemia: hold po meds.  ?D10 infusion ongoing.  Q1 BS checks for now.  ? ?5. Tachycardia: afib with RVR.  Resume home amio.  Reeval after completes 1 uprbc, may respond to volume.  Consider fluid bolus after PRBC if still tachy.  ?Hold any beta blocker or ccb given low bp.  ? ?6. Hypotension: fluid resuscitation, monitor closely.  ?AG slightly elevated from baseline, met acidosis. Check lactic.   ? ?  7. Anemia: receiving 1 u prbc now. Chronic anemia, slightly worse from baseline.  ?Also thrombocytopenic ? ?Best Practice (right click and "Reselect all SmartList Selections" daily)  ? ?Diet/type: Regular consistency (see orders) ?DVT prophylaxis: not indicated ?GI prophylaxis: PPI ?Lines: N/A ?Foley:  N/A ?Code Status:  full code ?Last date of multidisciplinary goals of care discussion [Full code, patient defers to his wife] ? ?Labs   ?CBC: ?Recent Labs  ?Lab 12/23/21 ?4008 12/23/21 ?0457  ?WBC 8.1  --   ?NEUTROABS PENDING  --   ?HGB 7.0* 8.2*   ?HCT 24.6* 24.0*  ?MCV 99.6  --   ?PLT 69*  --   ? ? ?Basic Metabolic Panel: ?Recent Labs  ?Lab 12/23/21 ?6761 12/23/21 ?0457  ?NA 132* 133*  ?K 4.1 4.2  ?CL 106 104  ?CO2 18*  --   ?GLUCOSE 54* 35*  ?BUN 19 21  ?CREATININE 0.80 0.60*  ?CALCIUM 8.1*  --   ? ?GFR: ?Estimated Creatinine Clearance: 106.9 mL/min (A) (by C-G formula based on SCr of 0.6 mg/dL (L)). ?Recent Labs  ?Lab 12/23/21 ?9509  ?WBC 8.1  ? ? ?Liver Function Tests: ?Recent Labs  ?Lab 12/23/21 ?3267  ?AST 15  ?ALT 14  ?ALKPHOS 112  ?BILITOT 0.5  ?PROT 5.8*  ?ALBUMIN <1.5*  ? ?No results for input(s): LIPASE, AMYLASE in the last 168 hours. ?No results for input(s): AMMONIA in the last 168 hours. ? ?ABG ?   ?Component Value Date/Time  ? TCO2 19 (L) 12/23/2021 0457  ?  ? ?Coagulation Profile: ?Recent Labs  ?Lab 12/23/21 ?1245  ?INR 6.6*  ? ? ?Cardiac Enzymes: ?No results for input(s): CKTOTAL, CKMB, CKMBINDEX, TROPONINI in the last 168 hours. ? ?HbA1C: ?Hgb A1c MFr Bld  ?Date/Time Value Ref Range Status  ?10/10/2021 08:02 AM 6.9 (H) 4.6 - 6.5 % Final  ?  Comment:  ?  Glycemic Control Guidelines for People with Diabetes:Non Diabetic:  <6%Goal of Therapy: <7%Additional Action Suggested:  >8%   ?09/07/2021 04:30 AM 7.0 (H) 4.8 - 5.6 % Final  ?  Comment:  ?  (NOTE) ?Pre diabetes:          5.7%-6.4% ? ?Diabetes:              >6.4% ? ?Glycemic control for   <7.0% ?adults with diabetes ?  ? ? ?CBG: ?Recent Labs  ?Lab 12/23/21 ?0434 12/23/21 ?8099 12/23/21 ?0542  ?GLUCAP 33* 47* 106*  ? ? ?Review of Systems:   ?Review of Systems  ?Constitutional:  Negative for chills and fever.  ?     Feels cold (chronic problem)  ?HENT:  Negative for hearing loss.   ?Eyes:  Negative for blurred vision.  ?Respiratory:  Negative for cough.   ?Cardiovascular:  Negative for chest pain.  ?Gastrointestinal:  Negative for heartburn.  ?Genitourinary:  Positive for hematuria. Negative for dysuria and flank pain.  ?Musculoskeletal:  Negative for myalgias.  ?Skin:  Negative for rash.   ?Neurological:  Negative for dizziness.  ?Endo/Heme/Allergies:  Negative for polydipsia.  ?Psychiatric/Behavioral:  Negative for depression.   ? ? ?Past Medical History:  ?He,  has a past medical history of Arthritis, Atrial fibrillation ([place]), Complication of anesthesia, Diabetes mellitus without complication ([place]), DVT (deep venous thrombosis) ([place]) (2015), Foley catheter in place (last changed 2eeks ago), GERD (gastroesophageal reflux disease), radiation therapy (40 tx 2015), Hypercholesterolemia, Hypertension, Prostate cancer ([place]) (12/06/13), Tourette's syndrome, Urinary retention, and Wears glasses.  ? ?Surgical History:  ? ?Past Surgical History:  ?Procedure Laterality Date  ?  APPENDECTOMY  1968  ? BALLOON DILATION N/A 08/02/2015  ? Procedure: BALLOON DILATION;  Surgeon: [name], MD;  Location: [facility];  Service: Endoscopy;  Laterality: N/A;  ? CARDIOVERSION N/A 05/13/2018  ? Procedure: CARDIOVERSION;  Surgeon: Sanda Klein, MD;  Location: [place];  Service: Cardiovascular;  Laterality: N/A;  ? CARDIOVERSION N/A 05/18/2018  ? Procedure: CARDIOVERSION;  Surgeon: Buford Dresser, MD;  Location: [facility] ENDOSCOPY;  Service: Cardiovascular;  Laterality: N/A;  ? COLONOSCOPY WITH PROPOFOL N/A 08/02/2015  ? Procedure: COLONOSCOPY WITH PROPOFOL w/ APC;  Surgeon: [name], MD;  Location: [facility];  Service: Endoscopy;  Laterality: N/A;  ? CYSTOSCOPY N/A 10/05/2020  ? Procedure: CYSTOSCOPY;  Surgeon: Alexis Frock, MD;  Location: [facility];  Service: Urology;  Laterality: N/A;  ? ESOPHAGOGASTRODUODENOSCOPY (EGD) WITH PROPOFOL N/A 08/02/2015  ? Procedure: ESOPHAGOGASTRODUODENOSCOPY (EGD) WITH PROPOFOL/ possible dilation.;  Surgeon: [name], MD;  Location: [facility] ENDOSCOPY;  Service: Endoscopy;  Laterality: N/A;  ? KNEE SURGERY  2001  ? right knee orthoscopic  ? PROSTATE BIOPSY  12/06/13  ? Gleason 4+3=7, volume 35 gm  ? [place]  ? Tourette'ssyndrome  spinal fluid removed   ? TONSILLECTOMY  as child  ? TRANSURETHRAL RESECTION OF PROSTATE N/A 09/30/2019  ? Procedure: TRANSURETHRAL RESECTION OF THE PROSTATE (TURP);  Surgeon: Alexis Frock, MD;  Location: [facility]

## 2021-12-23 NOTE — Progress Notes (Signed)
Orthopedic Tech Progress Note ?Patient Details:  ?Tommy Yang ?12-Nov-1950 ?[id number] ? ?Patient ID: Tommy Yang, male   DOB: 10-08-1950, 70 y.o.   MRN: [id number] ?I attended trauma page ?[name] ?12/23/2021, 4:43 AM ? ?

## 2021-12-23 NOTE — ED Provider Notes (Signed)
?[place] ?Provider Note ? ?CSN: [id number] ?Arrival date & time: 12/23/21 0430 ? ?Chief Complaint(s) ?Fall on Thinner, Afib RVR, and Hypoglycemia ?Pt bib EMS from home as Level 2 trauma, fall on blood thinners. Stood up from recliner and had witnessed fall and hit head, no LOC. Takes warfarin. Initially only responsive to pain. Initial CBG 32, dextrose 10 given. CBG increased to 124 and pt more responsive after receving dextrose 10. Pt also seen in fib RVR, HR 170-180. 49m cardizem given en route.  ?HPI ?Tommy HEVIAis a 71y.o. male with a past medical history listed below including hypertension, DVT, A-fib on warfarin, diabetes, metastatic prostate cancer on chemo who presents to the emergency department for fall on thinners.  Likely syncopal episode.  Positive head trauma.  Patient noted to be hypoglycemic with CBG of 32.  Improved with D10.  Also noted to be in A-fib RVR with rates in the 170s to 180s.  Given 10 mg of Cardizem in route.  ? ?Additionally patient is currently being treated for urinary tract infection with Augmentin started by urology 1 week ago for hematuria.  ? ?Patient denies any pain including headache, neck pain, back pain, chest pain, extremity pain. ?He complains only of being "cold." ? ?The history is provided by the patient, the EMS personnel and the spouse.  ? ?Past Medical History ?Past Medical History:  ?Diagnosis Date  ? Arthritis   ? Atrial fibrillation ([place]   ? Complication of anesthesia   ? needs to sit up  at 45 degree angle or gets afib during surgery or in recovery  ? Diabetes mellitus without complication (H[place] [place] [facility]   ? type 2  ? DVT (deep venous thrombosis) ([place] 2015  ? left leg.behind knee  ? Foley catheter in place last changed 2eeks ago  ? last 7 months  ? GERD (gastroesophageal reflux disease)   ? Hx of radiation therapy 40 tx 2015  ? Hypercholesterolemia   ? Hypertension   ? Prostate cancer ([place] 12/06/13  ? gleason 4+3=7, 6/12  cores positive. seed implant and radiation  ? Tourette's syndrome   ? Urinary retention   ? Wears glasses   ? ?Patient Active Problem List  ? Diagnosis Date Noted  ? Hypomagnesemia and hypophosphatemia 11/27/2021  ? Hypophosphatemia 11/27/2021  ? Hypoalbuminemia 11/27/2021  ? Hypotension 11/26/2021  ? Uncontrolled type 2 diabetes mellitus with hypoglycemia, without long-term current use of insulin (HSan Antonio 11/26/2021  ? Malnutrition of moderate degree 11/26/2021  ? Lactic acidosis 11/26/2021  ? Generalized weakness 11/26/2021  ? Folic acid deficiency 086/57/8469 ? Obesity (BMI 30-39.9) 11/26/2021  ? Catheter-associated urinary tract infection (HB[place] 11/25/2021  ? Hypomagnesemia 11/25/2021  ? Atrial fibrillation (HB[place] 09/07/2021  ? Near syncope 09/07/2021  ? Diabetes mellitus type 2 in obese ([place] 09/07/2021  ? Atrial fibrillation with RVR (HA[place]me[place] 09/07/2021  ? Anemia of chronic disease 09/07/2021  ? Chronic indwelling Foley catheter 09/07/2021  ? Diarrhea 06/18/2021  ? Peripheral edema 05/20/2021  ? Long term (current) use of anticoagulants 05/03/2021  ? Malignant neoplasm of prostate metastatic to bone (HRexford 04/30/2021  ? Recurrent UTI 11/22/2019  ? Urinary retention 09/30/2019  ? Chronic urinary retention with chronic bilateral hydronephrosis 08/12/2019  ? Obstructive uropathy 08/11/2019  ? Acute lower UTI 08/11/2019  ? AKI (acute kidney injury) (HChep[place] 08/11/2019  ? Acute urinary retention 08/11/2019  ? Dysuria 08/10/2019  ? Constipation 08/10/2019  ? Low back pain 08/10/2019  ? Paroxysmal atrial fibrillation with  RVR ([place]) 05/23/2019  ? Paroxysmal atrial fibrillation ([place]) 05/23/2019  ? Preventative health care 02/24/2019  ? Leg wound, right 02/24/2019  ? Essential hypertension 09/24/2018  ? Persistent atrial fibrillation   ? Microalbuminuria 04/23/2016  ? Rectal bleeding 07/18/2015  ? Dysphagia 07/18/2015  ? Chronic anticoagulation 03/14/2015  ? Shortness of breath 01/12/2015  ? Abnormal ECG 01/12/2015  ? Encounter for  screening colonoscopy 05/09/2014  ? Esophageal reflux 05/09/2014  ? Other dysphagia 05/09/2014  ? Evaluate for OSA (obstructive sleep apnea) 02/28/2014  ? Hyperlipidemia 02/28/2014  ? Prostate cancer ([place]) 12/14/2013  ? Erectile dysfunction 10/26/2013  ? History of DVT (deep vein thrombosis) 09/28/2013  ? Bilateral lower extremity edema 09/28/2013  ? ?Home Medication(s) ?Prior to Admission medications   ?Medication Sig Start Date End Date Taking? Authorizing Provider  ?ACETAMINOPHEN PO Take 2 tablets by mouth daily as needed (pain).    [provider]  ?amiodarone (PACERONE) 200 MG tablet Take 1 tablet (200 mg total) by mouth daily. 11/21/21   Sherran Needs, NP  ?apalutamide (ERLEADA) 60 MG tablet Take 240 mg by mouth daily at 2 PM. May be taken with or without food. Swallow tablets whole.    [provider]  ?atorvastatin (LIPITOR) 80 MG tablet TAKE 1 TABLET BY MOUTH EVERY DAY ?Patient taking differently: Take 80 mg by mouth every morning. 11/13/21   Elayne Snare, MD  ?blood glucose meter kit and supplies KIT Test blood sugar daily as directed. Dx code: E11.9 01/23/15   Darlyne Russian, MD  ?CALCIUM PO Take 1 tablet by mouth at bedtime.    [provider]  ?Cholecalciferol (VITAMIN D3 PO) Take 2 capsules by mouth at bedtime.    [provider]  ?Cyanocobalamin (VITAMIN B 12 PO) Take 1 tablet by mouth at bedtime.    [provider]  ?diphenoxylate-atropine (LOMOTIL) 2.5-0.025 MG tablet TAKE 1 TABLET BY MOUTH 4 (FOUR) TIMES DAILY AS NEEDED FOR DIARRHEA OR LOOSE STOOLS. ?Patient taking differently: Take 1 tablet by mouth 3 (three) times daily. 08/02/21   [name], MD  ?Empagliflozin-metFORMIN HCl ER (SYNJARDY XR) 12.12-998 MG TB24 TAKE 2 TABLETS BY MOUTH EVERY DAY 12/02/21   Mercy Riding, MD  ?folic acid (FOLVITE) 1 MG tablet Take 1 tablet (1 mg total) by mouth daily. 11/29/21   Mercy Riding, MD  ?furosemide (LASIX) 40 MG tablet Take 1 tablet (40 mg total) by mouth  daily. ?Patient taking differently: Take 40 mg by mouth daily as needed for fluid or edema. 09/12/21   Little Ishikawa, MD  ?glucose blood (ACCU-CHEK GUIDE) test strip Use as instructed to check blood sugar twice daily. 10/29/18   Elayne Snare, MD  ?leuprolide (LUPRON) 11.25 MG injection Inject 11.25 mg into the muscle every 6 (six) months.    [provider]  ?metoprolol tartrate (LOPRESSOR) 100 MG tablet Take 0.5 tablets (50 mg total) by mouth 2 (two) times daily for 1 day, THEN 1 tablet (100 mg total) 2 (two) times daily for 1 day. 11/28/21 11/30/21  Mercy Riding, MD  ?Multiple Vitamins-Minerals (ZINC PO) Take 1 tablet by mouth at bedtime.    [provider]  ?pantoprazole (PROTONIX) 40 MG tablet TAKE 1 TABLET BY MOUTH EVERY DAY ?Patient taking differently: 40 mg every morning. 11/18/21   [name], MD  ?Semaglutide,0.25 or 0.5MG/DOS, (OZEMPIC, 0.25 OR 0.5 MG/DOSE,) 2 MG/1.5ML SOPN Inject 0.5 mg into the skin once a week. ?Patient taking differently: Inject 0.5 mg into  the skin every Friday. 07/10/21   Elayne Snare, MD  ?tamsulosin (FLOMAX) 0.4 MG CAPS capsule Take 1 capsule (0.4 mg total) by mouth 2 (two) times daily. 09/24/21   Sherran Needs, NP  ?warfarin (COUMADIN) 5 MG tablet Take as directed by anticoagulation clinic 11/28/21   Mercy Riding, MD  ?                                                                                                                                  ?Allergies ?Shingrix [zoster vac recomb adjuvanted] ? ?Review of Systems ?Review of Systems ?As noted in HPI ? ?Physical Exam ?Vital Signs  ?I have reviewed the triage vital signs ?BP 97/64   Pulse (!) 120   Temp 98.8 ?F (37.1 ?C) (Oral)   Resp 10   Ht _0  (1.803 m)   Wt 103.9 kg   SpO2 100%   BMI 31.94 kg/m?  ? ?Physical Exam ?Vitals reviewed.  ?Constitutional:   ?   General: He is not in acute distress. ?   Appearance: He is well-developed. He is obese. He is ill-appearing. He is not diaphoretic.  ?HENT:   ?   Head: Normocephalic. Abrasion and contusion present. No laceration.  ?   Jaw: No pain on movement.  ? ?   Nose: Nose normal.  ?Eyes:  ?   General: No scleral icterus.    ?   Right eye: No discharge.     ?   Marin Comment

## 2021-12-23 NOTE — Consult Note (Signed)
Neurosurgery Consultation ? ?Reason for Consult: Subdural hematoma ?Referring Physician: Duwayne Heck ? ?CC: Fall ? ?HPI: This is a 71 y.o. man that presents after a fall. Of note, wife notes he previously fell about 2 weeks ago as well. No new weakness, numbness, or parasthesias, no recent change in bowel or bladder function, no headaches. No LOC, but did strike his head. +on warfarin for prior DVT, was on rivaroxaban for years but there's an interaction with his current prostate ca regimen that required a change.  ? ? ?ROS: A 14 point ROS was performed and is negative except as noted in the HPI and +for recent UTI and some chronic bloody urine after a foley change a few weeks ago. ? ?PMHx:  ?Past Medical History:  ?Diagnosis Date  ? Arthritis   ? Atrial fibrillation ([place])   ? Complication of anesthesia   ? needs to sit up  at 45 degree angle or gets afib during surgery or in recovery  ? Diabetes mellitus without complication ([place])   ? type 2  ? DVT (deep venous thrombosis) ([place]) 2015  ? left leg.behind knee  ? Foley catheter in place last changed 2eeks ago  ? last 7 months  ? GERD (gastroesophageal reflux disease)   ? Hx of radiation therapy 40 tx 2015  ? Hypercholesterolemia   ? Hypertension   ? Prostate cancer ([place]) 12/06/13  ? gleason 4+3=7, 6/12 cores positive. seed implant and radiation  ? Tourette's syndrome   ? Urinary retention   ? Wears glasses   ? ?FamHx:  ?Family History  ?Problem Relation Age of Onset  ? Heart disease Mother   ?     before age 50  ? Diabetes Mother   ? Hyperlipidemia Mother   ? Varicose Veins Mother   ? Hypertension Mother   ? Heart disease Father   ? Diabetes Father   ? Hyperlipidemia Father   ? Hypertension Father   ? Cancer Brother   ? COPD Brother   ? Diabetes Brother   ? Hyperlipidemia Brother   ? Hypertension Brother   ? ?SocHx:  reports that he quit smoking about 9 years ago. His smoking use included cigarettes. He has a 11.50 pack-year smoking history. He has never used smokeless  tobacco. He reports that he does not currently use alcohol. He reports that he does not use drugs. ? ?Exam: ?Vital signs in last 24 hours: ?Temp:  [97.9 ?F (36.6 ?C)-98.8 ?F (37.1 ?C)] 98.4 ?F (36.9 ?C) (04/24 1200) ?Pulse Rate:  [72-214] 144 (04/24 1300) ?Resp:  [0-27] 17 (04/24 1300) ?BP: (82-105)/(45-86) 105/86 (04/24 1300) ?SpO2:  [91 %-100 %] 91 % (04/24 1300) ?Weight:  [103.9 kg] 103.9 kg (04/24 0445) ?General: Awake, alert, cooperative, lying in bed in NAD ?Head: Normocephalic ?HEENT: Neck supple ?Pulmonary: breathing supplemental O2 via [place] comfortably, no evidence of increased work of breathing ?Cardiac: irregularly irregular and tachycardic ?Abdomen: S NT ND, foley in place w/ dark urine ?Extremities: Warm and well perfused x4 ?Neuro: AOx3, PERRL, EOMI, FS, +facial tics c/w TS ?Strength 5/5 x4, SILTx4 ? ? ?Assessment and Plan: 71 y.o. man s/p fall, on warfarin w/ INR of 6.6. Goodman personally reviewed, which shows R convexity mixed density SDH with a small amount of brain compression.  ? ?-no acute neurosurgical intervention indicated at this time ?-discussed w/ the pt and his wife, difficult situation - prior DVT in the setting of a current malignancy, newly diagnosed RVR and stroke risk, but falls and now SDH. I  think the safest plan would be to keep him off therapeutic anticoagulation for 10 days, repeat a CT head, then discuss restarting it. Will leave choice of agent to PCP/primary team given his polypharmacy and issues with warfarin.  ?-okay for DVT chemoprophylaxis on 4/26 from my standpoint  ?-please call with any concerns or questions ? ?Judith Part, MD ?12/23/21 ?1:45 PM ?[facility] ? ?

## 2021-12-23 NOTE — ED Notes (Signed)
Pt now alert, oriented, and able to answer questions appropriately ?

## 2021-12-24 ENCOUNTER — Encounter (HOSPITAL_COMMUNITY): Payer: Self-pay | Admitting: Pulmonary Disease

## 2021-12-24 DIAGNOSIS — E160 Drug-induced hypoglycemia without coma: Secondary | ICD-10-CM | POA: Diagnosis present

## 2021-12-24 DIAGNOSIS — S065XAA Traumatic subdural hemorrhage with loss of consciousness status unknown, initial encounter: Secondary | ICD-10-CM | POA: Diagnosis not present

## 2021-12-24 LAB — TYPE AND SCREEN
ABO/RH(D): A NEG
Antibody Screen: NEGATIVE
Unit division: 0
Unit division: 0

## 2021-12-24 LAB — RAPID URINE DRUG SCREEN, HOSP PERFORMED
Amphetamines: NOT DETECTED
Barbiturates: NOT DETECTED
Benzodiazepines: NOT DETECTED
Cocaine: NOT DETECTED
Opiates: NOT DETECTED
Tetrahydrocannabinol: NOT DETECTED

## 2021-12-24 LAB — BASIC METABOLIC PANEL
Anion gap: 7 (ref 5–15)
BUN: 17 mg/dL (ref 8–23)
CO2: 18 mmol/L — ABNORMAL LOW (ref 22–32)
Calcium: 7.4 mg/dL — ABNORMAL LOW (ref 8.9–10.3)
Chloride: 105 mmol/L (ref 98–111)
Creatinine, Ser: 0.93 mg/dL (ref 0.61–1.24)
GFR, Estimated: >60 mL/min (ref >60)
Glucose, Bld: 140 mg/dL — ABNORMAL HIGH (ref 70–99)
Potassium: 4.1 mmol/L (ref 3.5–5.1)
Sodium: 130 mmol/L — ABNORMAL LOW (ref 135–145)

## 2021-12-24 LAB — PROTIME-INR
INR: 1.2 (ref 0.8–1.2)
Prothrombin Time: 15.5 seconds — ABNORMAL HIGH (ref 11.4–15.2)

## 2021-12-24 LAB — BPAM RBC
Blood Product Expiration Date: 202305182359
Blood Product Expiration Date: 202305222359
ISSUE DATE / TIME: 202304240555
ISSUE DATE / TIME: 202304241813
Unit Type and Rh: 600
Unit Type and Rh: 600

## 2021-12-24 LAB — GLUCOSE, CAPILLARY
Glucose-Capillary: 131 mg/dL — ABNORMAL HIGH (ref 70–99)
Glucose-Capillary: 134 mg/dL — ABNORMAL HIGH (ref 70–99)
Glucose-Capillary: 136 mg/dL — ABNORMAL HIGH (ref 70–99)
Glucose-Capillary: 136 mg/dL — ABNORMAL HIGH (ref 70–99)
Glucose-Capillary: 140 mg/dL — ABNORMAL HIGH (ref 70–99)
Glucose-Capillary: 161 mg/dL — ABNORMAL HIGH (ref 70–99)
Glucose-Capillary: 168 mg/dL — ABNORMAL HIGH (ref 70–99)
Glucose-Capillary: 178 mg/dL — ABNORMAL HIGH (ref 70–99)
Glucose-Capillary: 183 mg/dL — ABNORMAL HIGH (ref 70–99)
Glucose-Capillary: 344 mg/dL — ABNORMAL HIGH (ref 70–99)

## 2021-12-24 LAB — CBC
HCT: 24.5 % — ABNORMAL LOW (ref 39.0–52.0)
Hemoglobin: 7.5 g/dL — ABNORMAL LOW (ref 13.0–17.0)
MCH: 29.1 pg (ref 26.0–34.0)
MCHC: 30.6 g/dL (ref 30.0–36.0)
MCV: 95 fL (ref 80.0–100.0)
Platelets: 48 10*3/uL — ABNORMAL LOW (ref 150–400)
RBC: 2.58 MIL/uL — ABNORMAL LOW (ref 4.22–5.81)
RDW: 21 % — ABNORMAL HIGH (ref 11.5–15.5)
WBC: 4.9 10*3/uL (ref 4.0–10.5)
nRBC: 0.4 % — ABNORMAL HIGH (ref 0.0–0.2)

## 2021-12-24 LAB — URINE CULTURE: Culture: NO GROWTH

## 2021-12-24 LAB — HIV ANTIBODY (ROUTINE TESTING W REFLEX): HIV Screen 4th Generation wRfx: NONREACTIVE

## 2021-12-24 LAB — TSH: TSH: 7.677 u[IU]/mL — ABNORMAL HIGH (ref 0.350–4.500)

## 2021-12-24 MED ORDER — METOPROLOL TARTRATE 50 MG PO TABS: 50.0000 mg | ORAL_TABLET | Freq: Two times a day (BID) | ORAL | Status: DC

## 2021-12-24 MED ORDER — METOPROLOL TARTRATE 5 MG/5ML IV SOLN
2.5000 mg | INTRAVENOUS | Status: DC | PRN
  Administered 2021-12-24 – 2021-12-26 (×6): 2.5 mg via INTRAVENOUS
  Filled 2021-12-24 (×6): qty 5

## 2021-12-24 MED ORDER — AMIODARONE HCL IN DEXTROSE 360-4.14 MG/200ML-% IV SOLN
60.0000 mg/h | INTRAVENOUS | Status: AC
  Administered 2021-12-24 (×2): 60 mg/h via INTRAVENOUS
  Filled 2021-12-24 (×2): qty 200

## 2021-12-24 MED ORDER — METOPROLOL TARTRATE 25 MG PO TABS
12.5000 mg | ORAL_TABLET | Freq: Two times a day (BID) | ORAL | Status: DC
Start: 2021-12-24 — End: 2021-12-25
  Administered 2021-12-24: 12.5 mg via ORAL
  Filled 2021-12-24: qty 1

## 2021-12-24 MED ORDER — AMIODARONE LOAD VIA INFUSION
150.0000 mg | Freq: Once | INTRAVENOUS | Status: AC
  Administered 2021-12-24: 150 mg via INTRAVENOUS
  Filled 2021-12-24: qty 83.34

## 2021-12-24 MED ORDER — CEPHALEXIN 250 MG PO CAPS
250.0000 mg | ORAL_CAPSULE | Freq: Four times a day (QID) | ORAL | Status: DC
  Administered 2021-12-25 – 2021-12-26 (×5): 250 mg via ORAL
  Filled 2021-12-24 (×7): qty 1

## 2021-12-24 MED ORDER — MORPHINE SULFATE (PF) 2 MG/ML IV SOLN
2.0000 mg | INTRAVENOUS | Status: DC | PRN
Start: 2021-12-24 — End: 2021-12-26
  Filled 2021-12-24: qty 1

## 2021-12-24 MED ORDER — FUROSEMIDE 20 MG PO TABS
20.0000 mg | ORAL_TABLET | Freq: Every day | ORAL | Status: DC
  Administered 2021-12-24 – 2021-12-26 (×3): 20 mg via ORAL
  Filled 2021-12-24 (×3): qty 1

## 2021-12-24 MED ORDER — AMIODARONE HCL IN DEXTROSE 360-4.14 MG/200ML-% IV SOLN
30.0000 mg/h | INTRAVENOUS | Status: DC
  Administered 2021-12-25 (×3): 30 mg/h via INTRAVENOUS
  Filled 2021-12-24 (×3): qty 200

## 2021-12-24 MED ORDER — METOPROLOL TARTRATE 25 MG PO TABS
25.0000 mg | ORAL_TABLET | Freq: Two times a day (BID) | ORAL | Status: DC
  Administered 2021-12-24: 25 mg via ORAL
  Filled 2021-12-24 (×2): qty 1

## 2021-12-24 NOTE — Progress Notes (Signed)
eLink Physician-Brief Progress Note ?Patient Name: Tommy Yang ?DOB: 10/29/1950 ?MRN: [id number] ? ? ?Date of Service ? 12/24/2021  ?HPI/Events of Note ? BP 95/68, MAP 78, HR 130 - 140. Lopressor 50 mg po Q 12 was due to be started tonight.  ?eICU Interventions ? Metoprolol dosing modified to 12.5 mg po Q 12 hours with first dose tonight, and Lopressor 2.5 mg iv Q 3 hours PRN HR > 115, hold for SBP < 90 mmHg.  ? ? ? ?  ? ?Kerry Kass Derriona Branscom ?12/24/2021, 9:45 PM ?

## 2021-12-24 NOTE — Progress Notes (Signed)
?   12/24/21 1830  ?Provider Notification  ?Provider Name/Title Glade Lloyd MD  ?Date Provider Notified 12/24/21  ?Time Provider Notified [phone]  ?Method of Notification Face-to-face  ?Notification Reason Other (Comment) ?(continued Afib rvr 180s asymptomatic)  ?Provider response See new orders ?(increase metoprolol, amio)  ?Date of Provider Response 12/24/21  ?Time of Provider Response 9798  ? ? ?

## 2021-12-24 NOTE — Assessment & Plan Note (Signed)
Neurologically intact and denies headache. ?CT stable ? ?-No intervention.   ?-Able to transfer to floor from a neurological standpoint ?

## 2021-12-24 NOTE — Progress Notes (Addendum)
?   12/24/21 1330  ?Provider Notification  ?Provider Name/Title Excell Seltzer MD Student/ Glade Lloyd MD  ?Date Provider Notified 12/24/21  ?Time Provider Notified 1330  ?Method of Notification Face-to-face  ?Notification Reason Change in status ?(foley leaking around catheter)  ?Provider response Other (Comment) ?(urology to be consulted)  ?Date of Provider Response 12/24/21  ?Time of Provider Response 1400  ? ?Bladder irrigated per order approximately 1100 with 6m. Minimal UOP 1200. 1300 urine noticed to be leaking around catheter, bladder scan revealed 202 ml. Patient states some discomfort. MD Student to bedside, MD R. Agarwala paged, urology to be consulted.  ? ? ?1800 pt with increased bladder discomfort, asking for pain medication, morphine ordered. Bladder scan revealed 0 ml. Orders to irrigate foley again at this time. 450 ml returned of red tinged urine with clots. Orders placed for q4h irrigation.  ? ?1900 Urology at bedside. Urine now more clear. Orders changed to PRN irrigation.  ?

## 2021-12-24 NOTE — Progress Notes (Signed)
?   12/24/21 1245  ?Provider Notification  ?Provider Name/Title R. Agarwala MD  ?Date Provider Notified 12/24/21  ?Time Provider Notified 1245  ?Method of Notification Page  ?Notification Reason Other (Comment) ?(CBG 344)  ?Provider response See new orders ?(turn D10 off; recheck 1500)  ?Date of Provider Response 12/24/21  ?Time of Provider Response 1320  ? ? ?

## 2021-12-24 NOTE — Consult Note (Signed)
I have been asked to see the patient by Dr. Kipp Brood, for evaluation and management of catheter management. ? 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  Over the past several years the patient has had issues with urinary retention and has had channel TURP twice to reopen his prostatic urethra.  His retention issues are now managed with a chronic indwelling Foley.  He was last changed last week in the urology clinic.  He presented to our clinic earlier than his scheduled exchange because he was having some difficulty with the catheter occluding.  A 20 French Foley catheter was replaced and appeared to be draining fairly well.  However, he was having some ongoing bleeding. ? ?Several days later the patient developed some hypoglycemia and subsequently lost his balance and fell.  He developed a subdural hematoma and ended up in the emergency department.  He is now in the ICU.  The Foley catheter has been somewhat problematic.  He has had leaking around the catheter as well as intermittent occlusion.  The nursing staff has been irrigating the catheter every 4 hours.  The most recent irrigation the patient bladder had approximately 400 cc of urine and several clots. ? ?At this point catheter is now draining clear yellow urine and thinks it appeared to have cleared up quite significantly. ? ?Review of systems: A 12 point comprehensive review of systems was obtained and is negative unless otherwise stated in the history of present illness. ? ?Patient Active Problem List  ? Diagnosis Date Noted  ? Hypoglycemia secondary to sulfonylurea 12/24/2021  ? Subdural hematoma ([place]) 12/23/2021  ? Obstructive uropathy 08/11/2019  ? Low back pain 08/10/2019  ? Paroxysmal atrial fibrillation ([place]) 05/23/2019  ? Essential hypertension 09/24/2018  ? Persistent atrial fibrillation   ?  Microalbuminuria 04/23/2016  ? Rectal bleeding 07/18/2015  ? Dysphagia 07/18/2015  ? Esophageal reflux 05/09/2014  ? Hyperlipidemia 02/28/2014  ? Prostate cancer ([place]) 12/14/2013  ? Erectile dysfunction 10/26/2013  ? History of DVT (deep vein thrombosis) 09/28/2013  ? Bilateral lower extremity edema 09/28/2013  ? ? ?No current facility-administered medications on file prior to encounter.  ? ?Current Outpatient Medications on File Prior to Encounter  ?Medication Sig Dispense Refill  ? amiodarone (PACERONE) 200 MG tablet Take 1 tablet (200 mg total) by mouth daily. 30 tablet 3  ? amoxicillin-clavulanate (AUGMENTIN) 875-125 MG tablet Take 1 tablet by mouth 2 (two) times daily. For 7 days    ? apalutamide (ERLEADA) 60 MG tablet Take 240 mg by mouth daily at 4 PM. May be taken with or without food. Swallow tablets whole.    ? atorvastatin (LIPITOR) 80 MG tablet TAKE 1 TABLET BY MOUTH EVERY DAY (Patient taking differently: Take 80 mg by mouth every morning.) 90 tablet 0  ? CALCIUM PO Take 600 mg by mouth at bedtime.    ? Cholecalciferol (VITAMIN D3 PO) Take 4,000 Units by mouth at bedtime.    ? Cyanocobalamin (VITAMIN B 12 PO) Take 125 mcg by mouth at bedtime.    ? diphenoxylate-atropine (LOMOTIL) 2.5-0.025 MG tablet TAKE 1 TABLET BY MOUTH 4 (FOUR) TIMES DAILY AS NEEDED FOR DIARRHEA OR LOOSE STOOLS. 40 tablet 1  ? Empagliflozin-metFORMIN HCl ER (SYNJARDY XR) 12.12-998 MG TB24 TAKE 2 TABLETS BY MOUTH EVERY DAY (Patient taking differently: Take 1 tablet by mouth 2 (two) times daily.) 180 tablet 3  ? furosemide (LASIX) 20  MG tablet Take 20 mg by mouth daily.    ? glimepiride (AMARYL) 1 MG tablet Take 1 mg by mouth daily with breakfast.    ? metoprolol tartrate (LOPRESSOR) 100 MG tablet Take 0.5 tablets (50 mg total) by mouth 2 (two) times daily for 1 day, THEN 1 tablet (100 mg total) 2 (two) times daily for 1 day. (Patient taking differently: Take 150m by mouth twice daily) 180 tablet 3  ? pantoprazole (PROTONIX) 40 MG  tablet TAKE 1 TABLET BY MOUTH EVERY DAY (Patient taking differently: 40 mg every morning.) 90 tablet 3  ? Semaglutide,0.25 or 0.5MG/DOS, (OZEMPIC, 0.25 OR 0.5 MG/DOSE,) 2 MG/1.5ML SOPN Inject 0.5 mg into the skin once a week. (Patient taking differently: Inject 0.5 mg into the skin every Friday.) 4.5 mL 2  ? tamsulosin (FLOMAX) 0.4 MG CAPS capsule Take 1 capsule (0.4 mg total) by mouth 2 (two) times daily.    ? warfarin (COUMADIN) 5 MG tablet Take as directed by anticoagulation clinic (Patient taking differently: Take 2.5-5 mg by mouth See admin instructions. Take 2.5464mby mouth on WED morning and then take 64m1mn all other days.) 30 tablet 0  ? blood glucose meter kit and supplies KIT Test blood sugar daily as directed. Dx code: E11V25.3each 0  ? folic acid (FOLVITE) 1 MG tablet Take 1 tablet (1 mg total) by mouth daily. 90 tablet 0  ? furosemide (LASIX) 40 MG tablet Take 1 tablet (40 mg total) by mouth daily. (Patient not taking: Reported on 12/23/2021) 30 tablet 0  ? glucose blood (ACCU-CHEK GUIDE) test strip Use as instructed to check blood sugar twice daily. 100 each 3  ? ? ?Past Medical History:  ?Diagnosis Date  ? Arthritis   ? Atrial fibrillation ([place] ? Complication of anesthesia   ? needs to sit up  at 45 degree angle or gets afib during surgery or in recovery  ? Diabetes mellitus without complication (HCC[place] ? type 2  ? DVT (deep venous thrombosis) ([place]CC[place]burg015  ? left leg.behind knee  ? Foley catheter in place last changed 2eeks ago  ? last 7 months  ? GERD (gastroesophageal reflux disease)   ? Hx of radiation therapy 40 tx 2015  ? Hypercholesterolemia   ? Hypertension   ? Malignant neoplasm of prostate metastatic to bone (HCCTrem[place]/30/2022  ? Prostate cancer ([place]CC[place]/7/15  ? gleason 4+3=7, 6/12 cores positive. seed implant and radiation  ? Tourette's syndrome   ? Urinary retention   ? Wears glasses   ? ? ?Past Surgical History:  ?Procedure Laterality Date  ? APPENDECTOMY  1968  ? BALLOON DILATION N/A  08/02/2015  ? Procedure: BALLOON DILATION;  Surgeon: DanMilus BanisterD;  [facility];  Service: Endoscopy;  Laterality: N/A;  ? CARDIOVERSION N/A 05/13/2018  ? Procedure: CARDIOVERSION;  Surgeon: CroSanda KleinD;  [facility];  Laterality: N/A;  ? CARDIOVERSION N/A 05/18/2018  ? Procedure: CARDIOVERSION;  Surgeon: ChrBuford DresserD;  [facility];  Service: Cardiovascular;  Laterality: N/A;  ? COLONOSCOPY WITH PROPOFOL N/A 08/02/2015  ? Procedure: COLONOSCOPY WITH PROPOFOL w/ APC;  Surgeon: DanMilus BanisterD;  [facility];  Service: Endoscopy;  Laterality: N/A;  ? CYSTOSCOPY N/A 10/05/2020  ? Procedure: CYSTOSCOPY;  Surgeon: ManAlexis FrockD;  [facility];  Laterality: N/A;  ? ESOPHAGOGASTRODUODENOSCOPY (EGD) WITH PROPOFOL N/A 08/02/2015  ? Procedure: ESOPHAGOGASTRODUODENOSCOPY (EGD) WITH PROPOFOL/  possible dilation.;  Surgeon: [name], MD;  [facility];  Service: Endoscopy;  Laterality: N/A;  ? KNEE SURGERY  2001  ? right knee orthoscopic  ? PROSTATE BIOPSY  12/06/13  ? Gleason 4+3=7, volume 35 gm  ? [place]  ? Tourette'ssyndrome spinal fluid removed   ? TONSILLECTOMY  as child  ? TRANSURETHRAL RESECTION OF PROSTATE N/A 09/30/2019  ? Procedure: TRANSURETHRAL RESECTION OF THE PROSTATE (TURP);  Surgeon: Alexis Frock, MD;  [facility];  Service: Urology;  Laterality: N/A;  1 HR  ? TRANSURETHRAL RESECTION OF PROSTATE N/A 10/05/2020  ? Procedure: TRANSURETHRAL RESECTION OF THE PROSTATE (TURP);  Surgeon: Alexis Frock, MD;  Location[facility];  Service: Urology;  Laterality: N/A;  1 HR  ? ? ?Social History  ? ?Tobacco Use  ? Smoking status: Former  ?  Packs/day: 0.50  ?  Years: 23.00  ?  Pack years: 11.50  ?  Types: Cigarettes  ?  Quit date: 08/01/2012  ?  Years since quitting: 9.4  ? Smokeless tobacco: Never  ?  Tobacco comments:  ?  Former smoker 10/24/21  ?Vaping Use  ? Vaping Use: Never used  ?Substance Use Topics  ? Alcohol use: Not Currently  ?  Comment: very seldom, maybe 6x per year  ? Drug use: Never  ? ? ?Family History  ?Proble

## 2021-12-24 NOTE — Assessment & Plan Note (Signed)
Discomfort since most recent Foley catheter replaced.  Now leaking around Foley catheter. ? ?-Urology to see. ?

## 2021-12-24 NOTE — Progress Notes (Signed)
?  Transition of Care (TOC) Screening Note ? ? ?Patient Details  ?Name: Tommy Yang ?Date of Birth: 12/25/1950 ? ? ?Transition of Care (TOC) CM/SW Contact:    ?Benard Halsted, LCSW ?Phone Number: ?12/24/2021, 9:15 AM ? ? ? ?Transition of Care Department [facility]) has reviewed patient and no TOC needs have been identified at this time. We will continue to monitor patient advancement through interdisciplinary progression rounds. If new patient transition needs arise, please place a TOC consult. ? ? ?

## 2021-12-24 NOTE — Assessment & Plan Note (Signed)
Patient was hypoglycemic to the 30s but asymptomatic.  Home sulfonylurea held.  Received octreotide.  Blood sugar has now improved following solid food. ? ?-D10W stopped blood sugar remains stable. ?-Hold oral hypoglycemic agents for now. ?-Ready for transfer. ?

## 2021-12-24 NOTE — H&P (Signed)
? ?NAME:  Tommy Yang, MRN:  [id number], DOB:  Jun 11, 1951, LOS: 1 ?ADMISSION DATE:  12/23/2021, CONSULTATION DATE:  4/25 ?REFERRING MD:  [facility], CHIEF COMPLAINT:  Subdural Hematoma  ? ?History of Present Illness:  ?Tommy Yang is a 71 year old man with a PMH of metastatic prostate cancer, DM and afib with RVR on eliquis who presented after a fall. States he got up from his recliner, told his wife he felt weak, and then fell. No LOC.  ? ?In the ED, he was found to be hypoglycemic to the 30s, also in afib with RVR with rates to the 170s. He was given cardizem 10 prior to arrival. CT was significant for a 39m subdural hematoma, right cerebral convexity, trace subarachnoid, and L post scalp lac. In ED started D10 infusion, vitamin K ordered, kcentra given to reverse warfarin. He was admitted for frequent neuro checks and glucose monitoring ? ?Pertinent  Medical History  ?Prostate ca 2016 dx 8/22 bone mets ?Erleada, s.p 4 cycles xofigo, xygeva monthly  ?Hld ?Dm2 ?DVT hx  ?Chronic folwy  ?Tourettes syndrome ?Urinary retention ?Afib on coumadin, amio ? ?Significant Hospital Events: ?Including procedures, antibiotic start and stop dates in addition to other pertinent events   ? ? ?Interim History / Subjective:  ?Patient's CBG continued to be low yesterday afternoon and overnight so his rate of D10 ggt was increased to 125 mL/hr. Neurosurgery has recommended no surgical intervention at this time. This morning, patient states he feels fine. Endorses racing heart which makes it hard to sleep. Denies dizziness or increased weakness from baseline. ? ?Objective   ?Blood pressure 110/74, pulse (!) 112, temperature 98.2 ?F (36.8 ?C), temperature source Oral, resp. rate 16, height '5\' 11"'$  (1.803 m), weight 103.9 kg, SpO2 92 %. ?   ?   ? ?Intake/Output Summary (Last 24 hours) at 12/24/2021 1226 ?Last data filed at 12/24/2021 1030 ?Gross per 24 hour  ?Intake 3116.58 ml  ?Output 3050 ml  ?Net 66.58 ml  ? ?Filed Weights  ?  12/23/21 0445  ?Weight: 103.9 kg  ? ? ?Examination: ?General: Chronically-ill, pale, generalized anasarca, awake and interactive ?Lungs: Soft bilateral crackles at lung bases ?Cardiovascular: Irregular rate and rhythm ?Abdomen: Non-tender, nondistended ?Extremities: 2+ pitting edema of BLE to thighs and swelling of hands.  ?Neuro: CN II- XII intact, 5/5 strength of upper and lower extremities ? ?Resolved Hospital Problem list   ?- ? ?Assessment & Plan:  ?71yo man with hx of metastatic prostate cancer, DM2, Afib, here with fall and found to have sdh and hypoglycemia. Neurologically stable and improving from a hypoglycemia standpoint, plan to wean off D10 infusion and transfer to floor for further care. ? ?# Fall  Hypoglycemia  T2DM ?Patient fell due to weakness in the setting of a glucose of 33. Had hospital admission 1 month ago also for hypoglycemia. Patient is likely on too many hypoglycemic mediations in the setting of decreased PO intake during treatment for cancer and would benefit from medication adjustment. At home he is taking Ozempic 0.5 once weekly, synjardy 12.12-998 two tablets BID, and glimepiride 1 mg daily. His last A1c was 6.9, which is at goal for his age group. In the acute setting, his glucose has stabilized on the D10 infusion. Will plan to wean off the infusion and encourage PO intake. ?- reduce D10 ggt to 75 mL/hr, wean as able ?- encourage PO intake ?- continue q1hr glucose checks ?- optimize medication regimen before discharge, can consider consult to endocrinology  ? ?#  Atrial Fibrillation with RVR ?Patient has been tachycardic and reports racing heart making it difficult to sleep. As his blood pressure has stabilized, it is safe to restart his metoprolol tartrate. Will start at a lower dose and titrate up as able, as his home dose of 100 mg BID may have been too high and contributed to his fall. ?- start metoprolol tartrate 25 mg BID ?- continue amiodarone 200 mg daily ?- monitor on  telemetry ?- hold anticoagulation at least until 5/4 per neurology ? ?# Anemia  Thrombocytopenia  Hematuria  UTI ?Patient has chronic anemia in the setting of chronic inflammation due to malignancy. He has also had bloody urine since foley change with urology 2 weeks ago. Hematuria has improved this morning, with urine looking more cherry colored compared to deep red prior. His hgb has remained stable following transfusion on admission (7.8 -> 7.5), and his platelets have mildly down-trended to below 50. This is likely dilutional in the setting of IV infusion. Will hold anticoagulation with platelets below 50. He was also found to have a UTI at recent hospital admission, and is being empirically treated here while awaiting culture. ?- follow up urine culture ?- transition ceftriaxone to keflex ?- consider urology consult if hematuria worsens ?- transfuse if hgb falls below 7 ?- hold anticoagulation while platelets are below 50 ? ?# Anasarca ?Patient presents with diffuse edema which seems somewhat worsened from his baseline. Per last hospitalization, he responded well to IV lasix and albumin and was discharged on oral lasix 40 mg once daily. Will start with low dose of lasix and monitor his response. ?- oral lasix 20 mg once daily ? ?# Hyponatremia ?Patient presents with downtrending sodium (133 -> 132 -> 130). He remains asymptomatic from a neurological standpoint. Differential for his hyponatremia likely includes dilutional cause secondary to glucose infusion vs hypovolemic in the setting of diffuse edema/third spacing. ?- obtain urine sodium if continuing to downtrend ? ?# Non-Anion Gap Metabolic Acidosis ?Patient with bicarb of 18, normal anion gap. Differential includes NS infusion, diarrhea, RTA. Diarrhea most likely cause as patient endorses this at home and during first day of hospital stay. Will continue on loperamide and monitor. ?- loperamide 4 mg every 6 hours prn ? ?# HLD ?- continue home  atorvastatin 80 mg daily ? ?# Urinary retention ?- continue home tamsulosin 0.4 mg twice daily ?- chronic indwelling foley in place ? ?Best Practice (right click and "Reselect all SmartList Selections" daily)  ? ?Diet/type: Regular consistency (see orders) ?DVT prophylaxis: not indicated ?GI prophylaxis: PPI ?Lines: N/A ?Foley:  Yes, and it is still needed ?Code Status:  full code ?Last date of multidisciplinary goals of care discussion [unknown] ? ?Labs   ?CBC: ?Recent Labs  ?Lab 12/23/21 ?7616 12/23/21 ?0457 12/23/21 ?0737 12/23/21 ?1136 12/23/21 ?1632 12/23/21 ?2210 12/24/21 ?1062  ?WBC 8.1  --  5.9  --  5.9 5.2 4.9  ?NEUTROABS 6.5  --   --   --   --   --   --   ?HGB 7.0*   < > 6.6* 7.2* 6.8* 7.8* 7.5*  ?HCT 24.6*   < > 22.0* 22.7* 22.1* 24.3* 24.5*  ?MCV 99.6  --  95.7  --  94.0 93.8 95.0  ?PLT 69*  --  52*  --  53* 50* 48*  ? < > = values in this interval not displayed.  ? ? ?Basic Metabolic Panel: ?Recent Labs  ?Lab 12/23/21 ?6948 12/23/21 ?0457 12/23/21 ?5462 12/23/21 ?1305 12/24/21 ?7035  ?  NA 132* 133*  --  132* 130*  ?K 4.1 4.2  --  3.9 4.1  ?CL 106 104  --  107 105  ?CO2 18*  --   --  19* 18*  ?GLUCOSE 54* 35*  --  48* 140*  ?BUN 19 21  --  18 17  ?CREATININE 0.80 0.60*  --  0.77 0.93  ?CALCIUM 8.1*  --   --  7.6* 7.4*  ?MG  --   --  1.4*  --   --   ? ?GFR: ?Estimated Creatinine Clearance: 91.9 mL/min (by C-G formula based on SCr of 0.93 mg/dL). ?Recent Labs  ?Lab 12/23/21 ?0959 12/23/21 ?1632 12/23/21 ?2210 12/24/21 ?6568  ?WBC 5.9 5.9 5.2 4.9  ? ? ?Liver Function Tests: ?Recent Labs  ?Lab 12/23/21 ?1275 12/23/21 ?1305  ?AST 15 14*  ?ALT 14 13  ?ALKPHOS 112 107  ?BILITOT 0.5 0.5  ?PROT 5.8* 5.1*  ?ALBUMIN <1.5* <1.5*  ? ?No results for input(s): LIPASE, AMYLASE in the last 168 hours. ?No results for input(s): AMMONIA in the last 168 hours. ? ?ABG ?   ?Component Value Date/Time  ? TCO2 19 (L) 12/23/2021 0457  ?  ? ?Coagulation Profile: ?Recent Labs  ?Lab 12/23/21 ?1700 12/23/21 ?1749 12/24/21 ?0827  ?INR  6.6* 1.3* 1.2  ? ? ?Cardiac Enzymes: ?No results for input(s): CKTOTAL, CKMB, CKMBINDEX, TROPONINI in the last 168 hours. ? ?HbA1C: ?Hgb A1c MFr Bld  ?Date/Time Value Ref Range Status  ?10/10/2021 08:02 AM 6.9 (H) 4.6 - 6.5

## 2021-12-24 NOTE — Progress Notes (Signed)
Notified Elink of pt's persistent hypoglycemia.  New orders are as followed: increase D10% gtt to 125 ml/hr and check CBG q 1h x 6.  RN to notify MD if blood sugars remain unstable.  Will continue to monitor.    ?

## 2021-12-24 NOTE — TOC CAGE-AID Note (Signed)
Transition of Care (TOC) - CAGE-AID Screening ? ? ?Patient Details  ?Name: Tommy Yang ?MRN: [id number] ?Date of Birth: 13-Apr-1951 ? ?Transition of Care (TOC) CM/SW Contact:    ?Sael Furches C Tarpley-Carter, LCSWA ?Phone Number: ?12/24/2021, 12:46 PM ? ? ?Clinical Narrative: ?Pt participated in [place].  Pt stated he does not use substance or ETOH.  Pt was not offered resources, due to no usage of substance or ETOH.   ? ? ?Ragina Fenter Tarpley-Carter, MSW, LCSW-A ?Pronouns:  She/Her/Hers ?[facility] of [facility] ?Clinical Social Worker ?Direct Number:  [phone] ?[email or web address] ? ?CAGE-AID Screening: ?  ? ?Have You Ever Felt You Ought to Cut Down on Your Drinking or Drug Use?: No ?Have People Annoyed You By Critizing Your Drinking Or Drug Use?: No ?Have You Felt Bad Or Guilty About Your Drinking Or Drug Use?: No ?Have You Ever Had a Drink or Used Drugs First Thing In The Morning to Steady Your Nerves or to Get Rid of a Hangover?: No ?CAGE-AID Score: 0 ? ?Substance Abuse Education Offered: No ? ?  ? ? ? ? ? ? ?

## 2021-12-24 NOTE — Progress Notes (Addendum)
?   12/24/21 1300  ?Provider Notification  ?Provider Name/Title MD Student Walker Kehr  ?Date Provider Notified 12/24/21  ?Time Provider Notified 1300  ?Method of Notification Face-to-face  ?Notification Reason Other (Comment) ?(continued sustained afib rvr; now 130s-160s)  ?Provider response No new orders ?(started metop this AM)  ?Date of Provider Response 12/24/21  ?Time of Provider Response 1300  ? ?Patient asymptomatic.  ?

## 2021-12-24 NOTE — Plan of Care (Signed)

## 2021-12-24 NOTE — Progress Notes (Signed)
?  Amiodarone Drug - Drug Interaction Consult Note ? ?Recommendations: ?No major interactions noted. ? ?Watch for muscle pain/weakness on atorvastatin; increased risk of bradycardia, AV block and myocardial depression with Lopressor; increased risk of cardiotoxicity if hypokalemia occurs on Lasix. ? ? ?Amiodarone is metabolized by the cytochrome P450 system and therefore has the potential to cause many drug interactions. Amiodarone has an average plasma half-life of 50 days (range 20 to 100 days).  ? ?There is potential for drug interactions to occur several weeks or months after stopping treatment and the onset of drug interactions may be slow after initiating amiodarone.  ? ?'[x]'$  Statins: Increased risk of myopathy. Simvastatin- restrict dose to '20mg'$  daily. ?Other statins: counsel patients to report any muscle pain or weakness immediately. ? ?'[]'$  Anticoagulants: Amiodarone can increase anticoagulant effect. Consider warfarin dose reduction. Patients should be monitored closely and the dose of anticoagulant altered accordingly, remembering that amiodarone levels take several weeks to stabilize. ? ?'[]'$  Antiepileptics: Amiodarone can increase plasma concentration of phenytoin, the dose should be reduced. Note that small changes in phenytoin dose can result in large changes in levels. Monitor patient and counsel on signs of toxicity. ? ?'[x]'$  Beta blockers: increased risk of bradycardia, AV block and myocardial depression. Sotalol - avoid concomitant use. ? ?'[]'$   Calcium channel blockers (diltiazem and verapamil): increased risk of bradycardia, AV block and myocardial depression. ? ?'[]'$   Cyclosporine: Amiodarone increases levels of cyclosporine. Reduced dose of cyclosporine is recommended. ? ?'[]'$  Digoxin dose should be halved when amiodarone is started. ? ?'[x]'$  Diuretics: increased risk of cardiotoxicity if hypokalemia occurs. ? ?'[]'$  Oral hypoglycemic agents (glyburide, glipizide, glimepiride): increased risk of hypoglycemia.  Patient's glucose levels should be monitored closely when initiating amiodarone therapy.  ? ?'[]'$  Drugs that prolong the QT interval:  Torsades de pointes risk may be increased with concurrent use - avoid if possible.  Monitor QTc, also keep magnesium/potassium WNL if concurrent therapy can't be avoided. ? Antibiotics: e.g. fluoroquinolones, erythromycin. ? Antiarrhythmics: e.g. quinidine, procainamide, disopyramide, sotalol. ? Antipsychotics: e.g. phenothiazines, haloperidol. ?  Lithium, tricyclic antidepressants, and methadone. ?Thank You,  ?Margretta Sidle Dohlen  ?12/24/2021 6:34 PM ? ? ? ? ?

## 2021-12-24 NOTE — Assessment & Plan Note (Addendum)
Asymptomatic with heart rates around 120 at baseline. Increased HR overnight requiring amiodarone infusion. Hypotensive overnight, beta-blocker reduced.  ?Still Afib at 120 this morning.  ? ?-Have restarted metoprolol 25 mg twice daily.  ?-Stop oral amiodarone as in permanent Afib. Patient had been on Cardizem in the past.  ?-Hold on resuming warfarin for 10 days. ?

## 2021-12-24 NOTE — Assessment & Plan Note (Signed)
Increased edema since admission. ? ?-Have restarted furosemide at 20 mg daily. ?

## 2021-12-25 DIAGNOSIS — S065XAA Traumatic subdural hemorrhage with loss of consciousness status unknown, initial encounter: Secondary | ICD-10-CM | POA: Diagnosis not present

## 2021-12-25 LAB — CBC
HCT: 25 % — ABNORMAL LOW (ref 39.0–52.0)
Hemoglobin: 8.1 g/dL — ABNORMAL LOW (ref 13.0–17.0)
MCH: 30 pg (ref 26.0–34.0)
MCHC: 32.4 g/dL (ref 30.0–36.0)
MCV: 92.6 fL (ref 80.0–100.0)
Platelets: 52 10*3/uL — ABNORMAL LOW (ref 150–400)
RBC: 2.7 MIL/uL — ABNORMAL LOW (ref 4.22–5.81)
RDW: 20.8 % — ABNORMAL HIGH (ref 11.5–15.5)
WBC: 5.6 10*3/uL (ref 4.0–10.5)
nRBC: 0 % (ref 0.0–0.2)

## 2021-12-25 LAB — COMPREHENSIVE METABOLIC PANEL
ALT: 13 U/L (ref 0–44)
AST: 15 U/L (ref 15–41)
Albumin: <1.5 g/dL — ABNORMAL LOW (ref 3.5–5.0)
Alkaline Phosphatase: 120 U/L (ref 38–126)
Anion gap: 8 (ref 5–15)
BUN: 19 mg/dL (ref 8–23)
CO2: 18 mmol/L — ABNORMAL LOW (ref 22–32)
Calcium: 6.9 mg/dL — ABNORMAL LOW (ref 8.9–10.3)
Chloride: 105 mmol/L (ref 98–111)
Creatinine, Ser: 0.95 mg/dL (ref 0.61–1.24)
GFR, Estimated: >60 mL/min (ref >60)
Glucose, Bld: 147 mg/dL — ABNORMAL HIGH (ref 70–99)
Potassium: 3.8 mmol/L (ref 3.5–5.1)
Sodium: 131 mmol/L — ABNORMAL LOW (ref 135–145)
Total Bilirubin: 0.3 mg/dL (ref 0.3–1.2)
Total Protein: 5.3 g/dL — ABNORMAL LOW (ref 6.5–8.1)

## 2021-12-25 LAB — GLUCOSE, CAPILLARY
Glucose-Capillary: 165 mg/dL — ABNORMAL HIGH (ref 70–99)
Glucose-Capillary: 146 mg/dL — ABNORMAL HIGH (ref 70–99)
Glucose-Capillary: 129 mg/dL — ABNORMAL HIGH (ref 70–99)
Glucose-Capillary: 144 mg/dL — ABNORMAL HIGH (ref 70–99)
Glucose-Capillary: 137 mg/dL — ABNORMAL HIGH (ref 70–99)
Glucose-Capillary: 146 mg/dL — ABNORMAL HIGH (ref 70–99)

## 2021-12-25 LAB — PROTIME-INR
INR: 1.2 (ref 0.8–1.2)
Prothrombin Time: 15.2 seconds (ref 11.4–15.2)

## 2021-12-25 LAB — MAGNESIUM: Magnesium: 1.8 mg/dL (ref 1.7–2.4)

## 2021-12-25 MED ORDER — DILTIAZEM HCL 60 MG PO TABS
30.0000 mg | ORAL_TABLET | Freq: Four times a day (QID) | ORAL | Status: DC
  Administered 2021-12-25 – 2021-12-26 (×4): 30 mg via ORAL
  Filled 2021-12-25 (×4): qty 1

## 2021-12-25 MED ORDER — MAGNESIUM SULFATE 2 GM/50ML IV SOLN
2.0000 g | Freq: Once | INTRAVENOUS | Status: AC
  Administered 2021-12-25: 2 g via INTRAVENOUS
  Filled 2021-12-25: qty 50

## 2021-12-25 MED ORDER — METOPROLOL TARTRATE 25 MG PO TABS
25.0000 mg | ORAL_TABLET | Freq: Two times a day (BID) | ORAL | Status: DC
Start: 2021-12-25 — End: 2021-12-26
  Administered 2021-12-25 (×2): 25 mg via ORAL
  Filled 2021-12-25 (×2): qty 1

## 2021-12-25 MED ORDER — CALCIUM GLUCONATE-NACL 1-0.675 GM/50ML-% IV SOLN
1.0000 g | Freq: Once | INTRAVENOUS | Status: AC
  Administered 2021-12-25: 1000 mg via INTRAVENOUS
  Filled 2021-12-25: qty 50

## 2021-12-25 NOTE — Progress Notes (Signed)
Pt's HR noted to be extremely inconsistent compared to prn parameters. When pt's HR noted to be more consistently greater than 115, prn metop admin. ?

## 2021-12-25 NOTE — Progress Notes (Signed)
? ?NAME:  Tommy Yang, MRN:  [id number], DOB:  07/06/1951, LOS: 2 ?ADMISSION DATE:  12/23/2021, CONSULTATION DATE:  4/25 ?REFERRING MD:  [facility], CHIEF COMPLAINT:  Subdural Hematoma  ? ?History of Present Illness:  ?Tommy Yang is a 71 year old man with a PMH of metastatic prostate cancer, DM and afib with RVR on eliquis who presented after a fall. States he got up from his recliner, told his wife he felt weak, and then fell. No LOC.  ? ?In the ED, he was found to be hypoglycemic to the 30s, also in afib with RVR with rates to the 170s. He was given cardizem 10 prior to arrival. CT was significant for a 38m subdural hematoma, right cerebral convexity, trace subarachnoid, and L post scalp lac. In ED started D10 infusion, vitamin K ordered, kcentra given to reverse warfarin. He was admitted for frequent neuro checks and glucose monitoring ? ?Pertinent  Medical History  ?Prostate ca 2016 dx 8/22 bone mets ?Erleada, s.p 4 cycles xofigo, xygeva monthly  ?Hld ?Dm2 ?DVT hx  ?Chronic folwy  ?Tourettes syndrome ?Urinary retention ?Afib on coumadin, amio ? ?Significant Hospital Events: ?Including procedures, antibiotic start and stop dates in addition to other pertinent events   ? ? ?Interim History / Subjective:  ?Patient's glucose rose to 344 yesterday while on D10 infusion so infusion was weaned and discontinued. CBG has continued to range between 112-180 off of the infusion.  ? ?Yesterday there was concern for clotting of patient's foley with leakage of urine around catheter. Foley irrigated at the bedside with resolution of any obstruction. Urology had no further recommendations. ? ?Overnight, BP fell to 95/68 with Hrs 130-140. Metoprolol 50 mg was held. He was started on metop 12.5 mg with additional 2.5 IV PRN for HR >115.  ? ?This morning, patient denies any dizziness or palpitations. Bladder discomfort has resolved.  ? ?Objective   ?Blood pressure 112/72, pulse (!) 119, temperature 97.6 ?F (36.4 ?C),  temperature source Oral, resp. rate (!) 23, height '5\' 11"'$  (1.803 m), weight 103 kg, SpO2 99 %. ?   ?   ? ?Intake/Output Summary (Last 24 hours) at 12/25/2021 0840 ?Last data filed at 12/25/2021 0800 ?Gross per 24 hour  ?Intake 1641.88 ml  ?Output 2135 ml  ?Net -493.12 ml  ? ? ?Filed Weights  ? 12/23/21 0445 12/25/21 0538  ?Weight: 103.9 kg 103 kg  ? ? ?Examination:  ?General: Chronically-ill, pale, generalized anasarca, awake and interactive ?Lungs: Anterior lung fields clear ?Cardiovascular: Irregular rate and rhythm ?Abdomen: Non-tender, nondistended ?Extremities: 2+ pitting edema of BLE to thighs and swelling of hands.  ?Neuro: No focal deficits. ? ?Resolved Hospital Problem list   ?- ? ?Assessment & Plan:  ?71yo man with hx of metastatic prostate cancer, DM2, Afib, here with fall and found to have sdh and hypoglycemia. Neurologically stable and improving from a hypoglycemia standpoint, plan to wean off D10 infusion and transfer to floor for further care. ? ?# Fall  Hypoglycemia  T2DM ?Patient fell due to weakness in the setting of a glucose of 33. Had hospital admission 1 month ago also for hypoglycemia. Patient is likely on too many hypoglycemic mediations in the setting of decreased PO intake during treatment for cancer and would benefit from medication adjustment. At home he is taking Ozempic 0.5 once weekly, synjardy 12.12-998 two tablets BID, and glimepiride 1 mg daily. His last A1c was 6.9, which is at goal for his age group. In the acute setting, his glucose has stabilized  off of the D10 infusion. Will plan to wean off the infusion and encourage PO intake. ?- discontinued D10 ggt  ?- encourage PO intake ?- space glucose checks to q4hr ?- optimize medication regimen before discharge, can consider consult to endocrinology  ? ?# Atrial Fibrillation with RVR ?Patient has been tachycardic but asymptomatic. Will continue to titrate metoprolol tartrate, 12.5 mg BID -> 25 mg BID -> likely 50 mg BID for HR goal  <110.  ?- titrate metoprolol tartrate to 50 mg BID ?- consider adding calcium channel blocked if rates remain uncontrolled ?- continue amiodarone 200 mg daily ?- monitor on telemetry ?- hold therapeutic anticoagulation at least until 5/4 per neurology ? ?# Anemia  Thrombocytopenia  Hematuria  UTI ?Hgb increased to 8.1 from 7.5. Blood in urine is improving. Urine culture with no growth. ?- continue keflex 250 mg TID for 5 days ?- appreciate urology recs regarding hematuria ?- transfuse if hgb falls below 7 ?- can resume DVT prophylaxis tomorrow if platelets remain above 50 ? ?# Anasarca ?Patient with 2.5 L urine output yesterday with one dose of oral lasix. Improved lower extremity edema. Will continue on this dose. ?- oral lasix 20 mg once daily ? ?# Hyponatremia ?Patient presents with downtrending sodium (133 -> 132 -> 130 -> 131). He remains asymptomatic from a neurological standpoint. Differential for his hyponatremia likely includes dilutional cause secondary to glucose infusion vs hypovolemic in the setting of diffuse edema/third spacing. ?- obtain urine sodium if continuing to downtrend ? ?# Non-Anion Gap Metabolic Acidosis ?Patient with bicarb of 18, normal anion gap. Differential includes NS infusion, diarrhea, RTA. Diarrhea most likely cause as patient endorses this at home and during first day of hospital stay. Will continue on loperamide and monitor. ?- loperamide 4 mg every 6 hours prn ? ?# HLD ?- continue home atorvastatin 80 mg daily ? ?# Urinary retention ?- continue home tamsulosin 0.4 mg twice daily ?- chronic indwelling foley in place and draining red-tinged urine ? ?Best Practice (right click and "Reselect all SmartList Selections" daily)  ? ?Diet/type: Regular consistency (see orders) ?DVT prophylaxis: LMWH plan to resume 4/27 ?GI prophylaxis: PPI ?Lines: N/A ?Foley:  Yes, and it is still needed ?Code Status:  full code ?Last date of multidisciplinary goals of care discussion [unknown] ? ?Labs    ?CBC: ?Recent Labs  ?Lab 12/23/21 ?7989 12/23/21 ?0457 12/23/21 ?2119 12/23/21 ?1136 12/23/21 ?1632 12/23/21 ?2210 12/24/21 ?4174 12/25/21 ?0814  ?WBC 8.1  --  5.9  --  5.9 5.2 4.9 5.6  ?NEUTROABS 6.5  --   --   --   --   --   --   --   ?HGB 7.0*   < > 6.6* 7.2* 6.8* 7.8* 7.5* 8.1*  ?HCT 24.6*   < > 22.0* 22.7* 22.1* 24.3* 24.5* 25.0*  ?MCV 99.6  --  95.7  --  94.0 93.8 95.0 92.6  ?PLT 69*  --  52*  --  53* 50* 48* 52*  ? < > = values in this interval not displayed.  ? ? ? ?Basic Metabolic Panel: ?Recent Labs  ?Lab 12/23/21 ?4818 12/23/21 ?0457 12/23/21 ?5631 12/23/21 ?1305 12/24/21 ?4970 12/25/21 ?2637  ?NA 132* 133*  --  132* 130* 131*  ?K 4.1 4.2  --  3.9 4.1 3.8  ?CL 106 104  --  107 105 105  ?CO2 18*  --   --  19* 18* 18*  ?GLUCOSE 54* 35*  --  48* 140* 147*  ?BUN 19  21  --  '18 17 19  '$ ?CREATININE 0.80 0.60*  --  0.77 0.93 0.95  ?CALCIUM 8.1*  --   --  7.6* 7.4* 6.9*  ?MG  --   --  1.4*  --   --  1.8  ? ? ?GFR: ?Estimated Creatinine Clearance: 89.7 mL/min (by C-G formula based on SCr of 0.95 mg/dL). ?Recent Labs  ?Lab 12/23/21 ?1632 12/23/21 ?2210 12/24/21 ?8638 12/25/21 ?1771  ?WBC 5.9 5.2 4.9 5.6  ? ? ? ?Liver Function Tests: ?Recent Labs  ?Lab 12/23/21 ?1657 12/23/21 ?1305 12/25/21 ?9038  ?AST 15 14* 15  ?ALT '14 13 13  '$ ?ALKPHOS 112 107 120  ?BILITOT 0.5 0.5 0.3  ?PROT 5.8* 5.1* 5.3*  ?ALBUMIN <1.5* <1.5* <1.5*  ? ? ?No results for input(s): LIPASE, AMYLASE in the last 168 hours. ?No results for input(s): AMMONIA in the last 168 hours. ? ?ABG ?   ?Component Value Date/Time  ? TCO2 19 (L) 12/23/2021 0457  ? ?  ? ?Coagulation Profile: ?Recent Labs  ?Lab 12/23/21 ?3338 12/23/21 ?3291 12/24/21 ?0827 12/25/21 ?9166  ?INR 6.6* 1.3* 1.2 1.2  ? ? ? ?Cardiac Enzymes: ?No results for input(s): CKTOTAL, CKMB, CKMBINDEX, TROPONINI in the last 168 hours. ? ?HbA1C: ?Hgb A1c MFr Bld  ?Date/Time Value Ref Range Status  ?10/10/2021 08:02 AM 6.9 (H) 4.6 - 6.5 % Final  ?  Comment:  ?  Glycemic Control Guidelines for People  with Diabetes:Non Diabetic:  <6%Goal of Therapy: <7%Additional Action Suggested:  >8%   ?09/07/2021 04:30 AM 7.0 (H) 4.8 - 5.6 % Final  ?  Comment:  ?  (NOTE) ?Pre diabetes:          5.7%-6.4% ? ?Diabetes:

## 2021-12-25 NOTE — Progress Notes (Signed)
eLink Physician-Brief Progress Note ?Patient Name: Tommy Yang ?DOB: 05/09/1951 ?MRN: [id number] ? ? ?Date of Service ? 12/25/2021  ?HPI/Events of Note ? Calcium 6.9, Albumin 1.5, Corrected Calcium 8.9 gm / dl.  ?eICU Interventions ? Calcium gluconate 1 gm bolus iv x 1.  ? ? ? ?  ? ?Kerry Kass Cammi Consalvo ?12/25/2021, 6:18 AM ?

## 2021-12-25 NOTE — Progress Notes (Signed)
?PROGRESS NOTE ? ? ? ?Tommy Yang  FWY:[id number] DOB: 21-Apr-1951 DOA: 12/23/2021 ?PCP: [name], MD  ? ? ?Brief Narrative:  ? ?Tommy Yang is a 71 year old male with past medical history significant for metastatic prostate cancer, type 2 diabetes mellitus, paroxysmal atrial fibrillation on anticoagulation outpatient with Coumadin who presented to [facility] ED via EMS on 4/24 following a fall.  Reported that he was attempting to get up from his recliner, total with his wife that he felt weak and then fell.  Denies loss of consciousness.  Patient was noted to be hypoglycemic on EMS arrival with CBG 32, and was given an amp of D50.  Patient was also noted to be in A-fib with RVR with heart rates 170-180 in route and was given 10 mg IV Cardizem.  In the ED, CT head significant for 8 mm subdural hematoma, right cerebral convexly study with trace subarachnoid hemorrhage and left posterior scalp laceration.  Patient was started on a D10 infusion, vitamin K and Kcentra given to reverse Coumadin.  Patient was admitted to the critical care service.  Neurosurgery was consulted and recommended holding anticoagulation for 10 days with repeat head CT prior to reinitiation.  Patient was started on amiodarone drip for control of his atrial fibrillation.  Patient was transferred to the hospitalist service on 12/25/2021. ? ?Assessment & Plan: ?  ? ?Subdural hematoma ?Patient presenting from home via EMS following fall without loss of consciousness.  CT head with without contrast notable for 8 mm subdural hematoma, right cerebral convexity with trace subarachnoid hemorrhage.  Patient was given vitamin K and Kcentra for anticoagulation reversal.  Patient was evaluated by neurosurgery, no intervention required and recommended holding anticoagulation for 10 days prior to reinitiation with follow-up CT. ?--Holding Coumadin until at least 5/4 ?--Continue supportive care ? ?Paroxysmal atrial fibrillation with RVR ?On EMS arrival,  patient was noted to have a heart rate in the 170s-180s.  Home regimen includes metoprolol tartrate 100 mg p.o. twice daily, and Coumadin.  INR 6.6 on admission; supratherapeutic, reversed with vitamin K and Kcentra. ?--Amiodarone drip ?--Increase metoprolol tartrate to 25 mg p.o. twice daily ?--Hold anticoagulation secondary to SDH as above ?--Continue monitor on telemetry ? ?Hypoglycemic event, POA ?Hx type 2 diabetes mellitus ?Patient was found to have blood glucose of 32 on EMS arrival.  Etiology likely secondary to his fall with underlying subdural hematoma.  Home regimen includes glimepiride 1 mg p.o. every morning, semaglutide 0.5 mg once weekly on Friday, and Empagliflozin-metformin 12.5 mg - 1000 mg p.o. twice daily.  Hemoglobin A1c 6.9 on 10/10/2021, well controlled.  Initially required D10 infusion during initial hospitalization, now titrated off. ?--Hold oral hypoglycemics while inpatient ?--CBG qAC/HS ? ?Metastatic prostate cancer with urinary retention ?Hematuria ?Urinary tract infection. ?Seen by urology, Dr. Louis Meckel while inpatient.  Recently exchanged in the urology clinic last week.  Urology recommends continue antibiotics for presumed UTI and no further recommendations at this point.  Can follow-up as scheduled urology in clinic for catheter exchange. ?--Tamsulosin 0.4 mg p.o. twice daily ?--Keflex 250 mg p.o. every 6 hours ? ?Anemia ?Etiology likely secondary to supratherapeutic INR of 6.6 on arrival complicated by hematuria and SDH.  Hemoglobin 7.0 on arrival trended down to a low of 6.6.  Transfused 4 unit PRBCs on 12/23/2021. ?--Hgb 7.0>>6.8>>7.5>8.1, stable ?--CBC daily ?--Transfuse for hemoglobin less than 7.0 ? ?Hyponatremia ?Sodium 132 on admission.  Likely secondary to volume overload/anasarca. ?--Continue furosemide 20 mg p.o. daily ?--Strict I's and O's and  daily weights ? ?Hypocalcemia ?Calcium 6.9, corrected for albumin 1.5 is equal to 8.9.  Did receive calcium gluconate 1 g IV ordered  by PCCM overnight. ?--BMP in a.m. ? ?Elevated TSH ?TSH 7.677 on 12/24/2021. ?--Check free T4 ? ?Anasarca ?--Furosemide 20 mg p.o. daily ?--Strict I's and O's and daily weights ? ?Hyperlipidemia ?--Atorvastatin 80 mg p.o. daily ? ?GERD: Continue Protonix 40 mg p.o. daily ? ? ? ? ? ?DVT prophylaxis: SCDs Start: 12/23/21 0702 ? ?  Code Status: Full Code ?Family Communication: No family present at bedside this morning ? ?Disposition Plan:  ?Level of care: ICU ?Status is: Inpatient ?Remains inpatient appropriate because: IV amiodarone drip ?  ? ?Consultants:  ?PCCM ?Urology ?Neurosurgery ? ?Procedures:  ?None ? ?Antimicrobials:  ?Keflex 2/26>> ?Ceftriaxone 4/24 - 4/25 ? ? ? ? ?Subjective: ?Patient seen examined at bedside, resting comfortably.  Lying in bed.  RN present.  Complaining about not being able to get up to the bedside chair.  Continues on IV amiodarone drip.  Remains tachycardic, but patient states this is normal for him when he moves around in the bed.  Wants to know when he can go home.  Discussed with patient that needs to transition from IV amiodarone drip and pending PT/OT evaluation.  No other specific questions or concerns at this time.  Denies headache, no dizziness, no visual changes, no chest pain, no palpitations, no shortness of breath, no abdominal pain, no fever/chills/night sweats, no nausea/vomiting/diarrhea, no fatigue, no paresthesias.  No acute events overnight per nursing staff. ? ?Objective: ?Vitals:  ? 12/25/21 0800 12/25/21 0900 12/25/21 1000 12/25/21 1100  ?BP: 112/72 118/77 120/74 103/82  ?Pulse: (!) 119 (!) 107 (!) 120 (!) 128  ?Resp: (!) 23 (!) 23 (!) 22 16  ?Temp: 97.6 ?F (36.4 ?C)     ?TempSrc: Oral     ?SpO2: 99% 99% 97% 98%  ?Weight:      ?Height:      ? ? ?Intake/Output Summary (Last 24 hours) at 12/25/2021 1204 ?Last data filed at 12/25/2021 1100 ?Gross per 24 hour  ?Intake 888.14 ml  ?Output 1785 ml  ?Net -896.86 ml  ? ?Filed Weights  ? 12/23/21 0445 12/25/21 0538  ?Weight: 103.9  kg 103 kg  ? ? ?Examination: ? ?Physical Exam: ?GEN: NAD, alert and oriented x 3, wd/wn ?HEENT: NCAT, PERRL, EOMI, sclera clear, MMM ?PULM: CTAB w/o wheezes/crackles, normal respiratory effort, on room air ?CV: Irregularly irregular rhythm, tachycardic w/o M/G/R ?GI: abd soft, NTND, NABS, no R/G/M ?MSK: 2+ pitting peripheral edema to bilateral lower extremities to mid shin, muscle strength globally intact 5/5 bilateral upper/lower extremities ?NEURO: CN II-XII intact, no focal deficits, sensation to light touch intact ?PSYCH: normal mood/affect ?Integumentary: dry/intact, no rashes or wounds ? ? ? ?Data Reviewed: I have personally reviewed following labs and imaging studies ? ?CBC: ?Recent Labs  ?Lab 12/23/21 ?3762 12/23/21 ?0457 12/23/21 ?8315 12/23/21 ?1136 12/23/21 ?1632 12/23/21 ?2210 12/24/21 ?1761 12/25/21 ?6073  ?WBC 8.1  --  5.9  --  5.9 5.2 4.9 5.6  ?NEUTROABS 6.5  --   --   --   --   --   --   --   ?HGB 7.0*   < > 6.6* 7.2* 6.8* 7.8* 7.5* 8.1*  ?HCT 24.6*   < > 22.0* 22.7* 22.1* 24.3* 24.5* 25.0*  ?MCV 99.6  --  95.7  --  94.0 93.8 95.0 92.6  ?PLT 69*  --  52*  --  53* 50* 48* 52*  ? < > =  values in this interval not displayed.  ? ?Basic Metabolic Panel: ?Recent Labs  ?Lab 12/23/21 ?5784 12/23/21 ?0457 12/23/21 ?6962 12/23/21 ?1305 12/24/21 ?9528 12/25/21 ?4132  ?NA 132* 133*  --  132* 130* 131*  ?K 4.1 4.2  --  3.9 4.1 3.8  ?CL 106 104  --  107 105 105  ?CO2 18*  --   --  19* 18* 18*  ?GLUCOSE 54* 35*  --  48* 140* 147*  ?BUN 19 21  --  '18 17 19  '$ ?CREATININE 0.80 0.60*  --  0.77 0.93 0.95  ?CALCIUM 8.1*  --   --  7.6* 7.4* 6.9*  ?MG  --   --  1.4*  --   --  1.8  ? ?GFR: ?Estimated Creatinine Clearance: 89.7 mL/min (by C-G formula based on SCr of 0.95 mg/dL). ?Liver Function Tests: ?Recent Labs  ?Lab 12/23/21 ?4401 12/23/21 ?1305 12/25/21 ?0272  ?AST 15 14* 15  ?ALT '14 13 13  '$ ?ALKPHOS 112 107 120  ?BILITOT 0.5 0.5 0.3  ?PROT 5.8* 5.1* 5.3*  ?ALBUMIN <1.5* <1.5* <1.5*  ? ?No results for input(s): LIPASE,  AMYLASE in the last 168 hours. ?No results for input(s): AMMONIA in the last 168 hours. ?Coagulation Profile: ?Recent Labs  ?Lab 12/23/21 ?5366 12/23/21 ?4403 12/24/21 ?0827 12/25/21 ?4742  ?INR 6.6* 1

## 2021-12-25 NOTE — Progress Notes (Signed)
[facility] ADULT ICU REPLACEMENT PROTOCOL ? ? ?The patient does apply for the [facility] Adult ICU Electrolyte Replacment Protocol based on the criteria listed below:  ? ?1.Exclusion criteria: TCTS patients, ECMO patients, and Dialysis patients ?2. Is GFR >/= 30 ml/min? Yes.    ?Patient's GFR today is >60 ?3. Is SCr </= 2? Yes.   ?Patient's SCr is 0.95 mg/dL ?4. Did SCr increase >/= 0.5 in 24 hours? No. ?5.Pt's weight >40kg  Yes.   ?6. Abnormal electrolyte(s): mag 1.8  ?7. Electrolytes replaced per protocol ?8.  Call MD STAT for K+ </= 2.5, Phos </= 1, or Mag </= 1 ?Physician:  n/a ? ?Tommy Yang 12/25/2021 6:05 AM ? ?

## 2021-12-25 NOTE — Evaluation (Signed)
Physical Therapy Evaluation ?Patient Details ?Name: Tommy Yang ?MRN: [id number] ?DOB: 08-May-1951 ?Today's Date: 12/25/2021 ? ?History of Present Illness ? 71 yo man with hx of prostate cancer with urinary retention issues with indwelling foley and recent obstruction with UTI, DM, afib on Coumadin, Tourettes syndrome admitted after a fall with head trauma and 70m R SDH, L post scalp lac and in a-fib w/RVR.  ?Clinical Impression ? Patient presents with decreased mobility due to weakness, decreased safety awareness, decreased balance, decreased activity tolerance and recent falls at home.  His wife has been assisting him at home along with using wheelchair for outings with transport van and bumping up/down the steps.  He currently needs +2 A for safety OOB to chair (his preference) and reports unable to stand for long.  Feel he could benefit from post acute inpatient rehab, but patient only interested in returning home.  PT will continue to follow acutely.  Will need follow up HHPT and H[facility]on[facility] at d/c.    ?   ? ?Recommendations for follow up therapy are one component of a multi-disciplinary discharge planning process, led by the attending physician.  Recommendations may be updated based on patient status, additional functional criteria and insurance authorization. ? ?Follow Up Recommendations Home health PT ([facility] ? ?  ?Assistance Recommended at Discharge Intermittent Supervision/Assistance  ?Patient can return home with the following ? A little help with walking and/or transfers;A lot of help with bathing/dressing/bathroom;Assistance with cooking/housework;Direct supervision/assist for medications management;Assist for transportation;Help with stairs or ramp for entrance ? ?  ?Equipment Recommendations Rolling walker (2 wheels) (reports his broke when he fell)  ?Recommendations for Other Services ?    ?  ?Functional Status Assessment Patient has had a recent decline in their functional status and demonstrates  the ability to make significant improvements in function in a reasonable and predictable amount of time.  ? ?  ?Precautions / Restrictions Precautions ?Precautions: Fall ?Precaution Comments: watch BP ?Restrictions ?Weight Bearing Restrictions: No  ? ?  ? ?Mobility ? Bed Mobility ?Overal bed mobility: Needs Assistance ?Bed Mobility: Supine to Sit ?  ?  ?Supine to sit: HOB elevated, Min assist ?  ?  ?General bed mobility comments: assist with rails and HHA to pull up ?  ? ?Transfers ?Overall transfer level: Needs assistance ?Equipment used: Rolling walker (2 wheels) ?Transfers: Sit to/from Stand, Bed to chair/wheelchair/BSC ?Sit to Stand: Min assist, +2 safety/equipment ?Stand pivot transfers: Min assist ?  ?  ?  ?  ?General transfer comment: up to his feet with assist (RN in room as well per pt request for safety); cues for technique moving to chair with RW and due to pt anxiety ("I can't stand long") ?  ? ?Ambulation/Gait ?  ?  ?  ?  ?  ?  ?  ?General Gait Details: deferred due to weakness/fear ? ?Stairs ?  ?  ?  ?  ?  ? ?Wheelchair Mobility ?  ? ?Modified Rankin (Stroke Patients Only) ?  ? ?  ? ?Balance Overall balance assessment: Needs assistance ?Sitting-balance support: Feet supported ?Sitting balance-Leahy Scale: Fair ?  ?  ?Standing balance support: Bilateral upper extremity supported ?Standing balance-Leahy Scale: Poor ?Standing balance comment: min A with UE support ?  ?  ?  ?  ?  ?  ?  ?  ?  ?  ?  ?   ? ? ? ?Pertinent Vitals/Pain Pain Assessment ?Pain Assessment: Faces ?Faces Pain Scale: Hurts even more ?Pain Location: generalized soreness  from fall ?Pain Descriptors / Indicators: Aching, Sore ?Pain Intervention(s): Monitored during session, Repositioned  ? ? ?Home Living Family/patient expects to be discharged to:: Private residence ?Living Arrangements: Spouse/significant other ?Available Help at Discharge: Family ?Type of Home: House ?Home Access: Stairs to enter ?Entrance Stairs-Rails:  (no rails, has  post) ?Entrance Stairs-Number of Steps: 2 + landing ?  ?Home Layout: One level ?Home Equipment: BSC/3in1;Rolling Walker (2 wheels);Wheelchair - manual;Cane - single point ?Additional Comments: lift chair  ?  ?Prior Function Prior Level of Function : Needs assist ?  ?  ?  ?  ?  ?  ?Mobility Comments: using lift chair able to get up unaided with RW and get to [facility]; wife has to help when on couch or other surface ?ADLs Comments: wife assists with sponge bathing ?  ? ? ?Hand Dominance  ? Dominant Hand: Right ? ?  ?Extremity/Trunk Assessment  ? Upper Extremity Assessment ?Upper Extremity Assessment: Generalized weakness ?  ? ?Lower Extremity Assessment ?Lower Extremity Assessment: Generalized weakness ?  ? ?Cervical / Trunk Assessment ?Cervical / Trunk Assessment: Kyphotic;Other exceptions ?Cervical / Trunk Exceptions: edema throughout extremities, R worse than L  ?Communication  ? Communication: No difficulties  ?Cognition Arousal/Alertness: Awake/alert ?Behavior During Therapy: [facility] for tasks assessed/performed ?Overall Cognitive Status: Impaired/Different from baseline ?Area of Impairment: Orientation, Safety/judgement, Following commands ?  ?  ?  ?  ?  ?  ?  ?  ?Orientation Level: Disoriented to, Time ?  ?  ?Following Commands: Follows one step commands consistently, Follows one step commands with increased time ?Safety/Judgement: Decreased awareness of safety ?  ?  ?General Comments: thinks he has been here four days; needs step by step cues due to fearful ?  ?  ? ?  ?General Comments General comments (skin integrity, edema, etc.): edema in both UE and LE's R more than L ? ?  ?Exercises    ? ?Assessment/Plan  ?  ?PT Assessment Patient needs continued PT services  ?PT Problem List Decreased strength;Decreased mobility;Decreased activity tolerance;Decreased balance;Decreased knowledge of use of DME;Decreased safety awareness;Cardiopulmonary status limiting activity ? ?   ?  ?PT Treatment Interventions DME  instruction;Therapeutic activities;Therapeutic exercise;Gait training;Patient/family education;Balance training;Functional mobility training   ? ?PT Goals (Current goals can be found in the Care Plan section)  ?Acute Rehab PT Goals ?Patient Stated Goal: to go home ?PT Goal Formulation: With patient ?Time For Goal Achievement: 01/08/22 ?Potential to Achieve Goals: Good ? ?  ?Frequency Min 3X/week ?  ? ? ?Co-evaluation   ?  ?  ?  ?  ? ? ?  ?AM-PAC PT "6 Clicks" Mobility  ?Outcome Measure Help needed turning from your back to your side while in a flat bed without using bedrails?: A Little ?Help needed moving from lying on your back to sitting on the side of a flat bed without using bedrails?: A Little ?Help needed moving to and from a bed to a chair (including a wheelchair)?: A Little ?Help needed standing up from a chair using your arms (e.g., wheelchair or bedside chair)?: Total ?Help needed to walk in hospital room?: Total ?Help needed climbing 3-5 steps with a railing? : Total ?6 Click Score: 12 ? ?  ?End of Session Equipment Utilized During Treatment: Gait belt ?Activity Tolerance: Patient limited by fatigue ?Patient left: in chair;with call bell/phone within reach;with chair alarm set ?Nurse Communication: Mobility status ?PT Visit Diagnosis: Other abnormalities of gait and mobility (R26.89);History of falling (Z91.81);Muscle weakness (generalized) (M62.81) ?  ? ?Time:  8115-7262 ?PT Time Calculation (min) (ACUTE ONLY): 28 min ? ? ?Charges:   PT Evaluation ?$PT Eval Moderate Complexity: 1 Mod ?PT Treatments ?$Therapeutic Activity: 8-22 mins ?  ?   ? ? ?Magda Kiel, PT ?Acute Rehabilitation Services ?MBTD[facility]:[phone] ?Office:[phone] ?12/25/2021 ? ? ?Reginia Naas ?12/25/2021, 11:00 AM ? ?

## 2021-12-25 NOTE — Evaluation (Signed)
Occupational Therapy Evaluation ?Patient Details ?Name: Tommy Yang ?MRN: [id number] ?DOB: August 19, 1951 ?Today's Date: 12/25/2021 ? ? ?History of Present Illness 71 yo man with hx of prostate cancer with urinary retention issues with indwelling foley and recent obstruction with UTI, DM, afib on Coumadin, Tourettes syndrome admitted after a fall with head trauma and 57m R SDH, L post scalp lac and in a-fib w/RVR.  ? ?Clinical Impression ?  ?SKyreesewas evaluated s/p the above admission list. He requires assist from his wife at baseline and is extremely limited by mobility, [facility] Upon arrival pt had just gotten back to the bed and refusing OOB with supine BP of 78/65, RN notified. Pt agreeable to bed level assessment and presenting with BUE edema, generalized weakness and poor activity tolerance. HR elevated to 160s. BP to 83/72 at the end of the session. He currently requires max A for LB ADLs and set - min A for UB ADLs. Pt's wife expressed concern of her ability to safely care for him at this level. He will benefit from OT acutely to address the limitations listed below. Pt would greatly benefit from multidisciplinary approach to intensive therapies at the AIR level to have maximum functional progress; however, pt currently refusing all post acute therapies with the exception of HHOT to d/c home. ?   ? ?Recommendations for follow up therapy are one component of a multi-disciplinary discharge planning process, led by the attending physician.  Recommendations may be updated based on patient status, additional functional criteria and insurance authorization.  ? ?Follow Up Recommendations ? Acute inpatient rehab (3hours/day) (Pt currently refusing post-acute rehab stay. If discharging home pt will need HHOT and HHaide)  ?  ?Assistance Recommended at Discharge Frequent or constant Supervision/Assistance  ?Patient can return home with the following A lot of help with  walking and/or transfers;A lot of help with bathing/dressing/bathroom;Help with stairs or ramp for entrance;Assist for transportation;Direct supervision/assist for medications management;Direct supervision/assist for financial management;Assistance with cooking/housework ? ?  ?Functional Status Assessment ? Patient has had a recent decline in their functional status and demonstrates the ability to make significant improvements in function in a reasonable and predictable amount of time.  ?Equipment Recommendations ? None recommended by OT  ?  ?Recommendations for Other Services Rehab consult ? ? ?  ?Precautions / Restrictions Precautions ?Precautions: Fall ?Precaution Comments: watch BP ?Restrictions ?Weight Bearing Restrictions: No  ? ?  ? ?Mobility Bed Mobility ?Overal bed mobility: Needs Assistance ?  ?  ?  ?  ?  ?  ?General bed mobility comments: pt declining due to just getting back into bed, also BPs extremely low ?  ? ?Transfers ?Overall transfer level: Needs assistance ?  ?  ?  ?  ?  ?  ?  ?  ?  ?  ? ?  ?   ? ?ADL either performed or assessed with clinical judgement  ? ?ADL Overall ADL's : Needs assistance/impaired ?Eating/Feeding: Set up;Sitting ?  ?Grooming: Set up;Sitting ?  ?Upper Body Bathing: Minimal assistance;Sitting ?  ?Lower Body Bathing: Maximal assistance;Sit to/from stand ?  ?Upper Body Dressing : Set up;Sitting ?  ?Lower Body Dressing: Maximal assistance;Sit to/from stand ?  ?Toilet Transfer: Moderate assistance;+2 for safety/equipment;Stand-pivot;Rolling walker (2 wheels) ?  ?Toileting- Clothing Manipulation and Hygiene: Maximal assistance;Sit to/from stand ?  ?  ?  ?Functional mobility during ADLs: Moderate assistance;+2 for safety/equipment ?General ADL Comments: limited bad poor vital signs this date.  assessment completed at bed level with the above assist levels completed wtih clinical judgement after bed level strength and mobility assessment. pt also sponge bathes adn SP transfers at  baseline with his wifes assistance  ? ? ? ?Vision Baseline Vision/History: 0 No visual deficits ?Ability to See in Adequate Light: 0 Adequate ?Vision Assessment?: No apparent visual deficits  ?   ?   ?   ? ?Pertinent Vitals/Pain Pain Assessment ?Pain Assessment: Faces ?Faces Pain Scale: Hurts little more ?Pain Location: generalized soreness from fall ?Pain Descriptors / Indicators: Aching, Sore ?Pain Intervention(s): Limited activity within patient's tolerance, Monitored during session  ? ? ? ?Hand Dominance Right ?  ?Extremity/Trunk Assessment Upper Extremity Assessment ?Upper Extremity Assessment: Generalized weakness (edema throughout) ?  ?Lower Extremity Assessment ?Lower Extremity Assessment: Generalized weakness ?  ?Cervical / Trunk Assessment ?Cervical / Trunk Assessment: Kyphotic;Other exceptions ?Cervical / Trunk Exceptions: edema throughout extremities, R worse than L ?  ?Communication Communication ?Communication: No difficulties ?  ?Cognition Arousal/Alertness: Awake/alert ?Behavior During Therapy: [facility] for tasks assessed/performed ?Overall Cognitive Status: Impaired/Different from baseline ?Area of Impairment: Orientation, Safety/judgement, Following commands ?  ?  ?  ?  ?  ?  ?  ?  ?Orientation Level: Disoriented to, Time ?  ?  ?Following Commands: Follows one step commands consistently, Follows one step commands with increased time ?Safety/Judgement: Decreased awareness of safety ?  ?  ?General Comments: Agitated with questioning and therapist recommendation of post-actue rehab adn wife expressing concern of her ability to care for him at home. follows simple one step cues for bed level assessment ?  ?  ?General Comments  BUEs swollen. BP in supine was 78/65, and 83/72 after 5 minutes adn some movement at bed level. HR to 160s with UE movement. RN notified. OOB deferred ? ?  ? ?Home Living Family/patient expects to be discharged to:: Private residence ?Living Arrangements: Spouse/significant  other ?Available Help at Discharge: Family ?Type of Home: House ?Home Access: Stairs to enter ?Entrance Stairs-Number of Steps: 2 + landing ?  ?Home Layout: One level ?  ?  ?Bathroom Shower/Tub: Sponge bathes at baseline ?  ?  ?  ?  ?Home Equipment: BSC/3in1;Rolling Walker (2 wheels);Wheelchair - manual;Cane - single point ?  ?Additional Comments: lift chiar. +wife has to return to work at the end of May ?  ? ?  ?Prior Functioning/Environment Prior Level of Function : Needs assist ?  ?  ?  ?  ?  ?  ?Mobility Comments: using lift chair able to get up unaided with RW and get to [facility]; wife has to help when on couch or other surface ?ADLs Comments: wife assists with sponge bathing ?  ? ?  ?  ?OT Problem List: Decreased strength;Decreased range of motion;Decreased activity tolerance;Decreased cognition;Decreased safety awareness;Decreased knowledge of use of DME or AE;Pain;Increased edema ?  ?   ?OT Treatment/Interventions: Self-care/ADL training;Therapeutic exercise;Therapeutic activities;Balance training;Patient/family education;DME and/or AE instruction  ?  ?OT Goals(Current goals can be found in the care plan section) Acute Rehab OT Goals ?Patient Stated Goal: home ?OT Goal Formulation: With patient ?Time For Goal Achievement: 01/08/22 ?Potential to Achieve Goals: Good ?ADL Goals ?Pt Will Transfer to Toilet: with min assist;stand pivot transfer;bedside commode ?Pt Will Perform Toileting - Clothing Manipulation and hygiene: with min assist;sitting/lateral leans ?Additional ADL Goal #1: Pt will demonstrated incr activity tolerance to complete at least 3 ADLs in unsupported sitting with supervision A  ?OT Frequency: Min 2X/week ?  ? ?   ?AM-PAC  OT "6 Clicks" Daily Activity     ?Outcome Measure Help from another person eating meals?: A Little ?Help from another person taking care of personal grooming?: A Little ?Help from another person toileting, which includes using toliet, bedpan, or urinal?: A Lot ?Help from another  person bathing (including washing, rinsing, drying)?: A Lot ?Help from another person to put on and taking off regular upper body clothing?: A Little ?Help from another person to put on and taking off regular lower b

## 2021-12-26 DIAGNOSIS — S065XAA Traumatic subdural hemorrhage with loss of consciousness status unknown, initial encounter: Secondary | ICD-10-CM | POA: Diagnosis not present

## 2021-12-26 LAB — CBC
HCT: 24.8 % — ABNORMAL LOW (ref 39.0–52.0)
Hemoglobin: 7.6 g/dL — ABNORMAL LOW (ref 13.0–17.0)
MCH: 29.8 pg (ref 26.0–34.0)
MCHC: 30.6 g/dL (ref 30.0–36.0)
MCV: 97.3 fL (ref 80.0–100.0)
Platelets: 58 10*3/uL — ABNORMAL LOW (ref 150–400)
RBC: 2.55 MIL/uL — ABNORMAL LOW (ref 4.22–5.81)
RDW: 21.4 % — ABNORMAL HIGH (ref 11.5–15.5)
WBC: 6.1 10*3/uL (ref 4.0–10.5)
nRBC: 0 % (ref 0.0–0.2)

## 2021-12-26 LAB — GLUCOSE, CAPILLARY
Glucose-Capillary: 129 mg/dL — ABNORMAL HIGH (ref 70–99)
Glucose-Capillary: 131 mg/dL — ABNORMAL HIGH (ref 70–99)

## 2021-12-26 LAB — BASIC METABOLIC PANEL
Anion gap: 9 (ref 5–15)
BUN: 16 mg/dL (ref 8–23)
CO2: 18 mmol/L — ABNORMAL LOW (ref 22–32)
Calcium: 7 mg/dL — ABNORMAL LOW (ref 8.9–10.3)
Chloride: 108 mmol/L (ref 98–111)
Creatinine, Ser: 0.92 mg/dL (ref 0.61–1.24)
GFR, Estimated: >60 mL/min (ref >60)
Glucose, Bld: 131 mg/dL — ABNORMAL HIGH (ref 70–99)
Potassium: 3.8 mmol/L (ref 3.5–5.1)
Sodium: 135 mmol/L (ref 135–145)

## 2021-12-26 LAB — T4, FREE: Free T4: 1.01 ng/dL (ref 0.61–1.12)

## 2021-12-26 LAB — MAGNESIUM: Magnesium: 1.9 mg/dL (ref 1.7–2.4)

## 2021-12-26 MED ORDER — CEPHALEXIN 250 MG PO CAPS: 250.0000 mg | ORAL_CAPSULE | Freq: Four times a day (QID) | ORAL | 0 refills | Status: DC

## 2021-12-26 MED ORDER — METOPROLOL TARTRATE 50 MG PO TABS
50.0000 mg | ORAL_TABLET | Freq: Two times a day (BID) | ORAL | Status: DC
  Administered 2021-12-26: 50 mg via ORAL
  Filled 2021-12-26: qty 1

## 2021-12-26 MED ORDER — WARFARIN SODIUM 5 MG PO TABS
ORAL_TABLET | ORAL | 0 refills | Status: DC
Start: 2022-01-03 — End: 2022-01-03

## 2021-12-26 MED ORDER — AMIODARONE HCL 200 MG PO TABS
200.0000 mg | ORAL_TABLET | Freq: Every day | ORAL | Status: DC
  Administered 2021-12-26: 200 mg via ORAL
  Filled 2021-12-26: qty 1

## 2021-12-26 NOTE — Care Management (Signed)
?  ?  Durable Medical Equipment  ?(From admission, onward)  ?  ? ? ?  ? ?  Start     Ordered  ? 12/25/21 1240  For home use only DME Walker rolling  Once       ?Question Answer Comment  ?Walker: With 5 Inch Wheels   ?Patient needs a walker to treat with the following condition Gait abnormality   ?  ? 12/25/21 1239  ? Unscheduled  For home use only DME standard manual wheelchair with seat cushion  Once       ?Comments: Patient suffers from weakness which impairs their ability to perform daily activities like toileting in the home.  A walker will not resolve issue with performing activities of daily living. A wheelchair will allow patient to safely perform daily activities. Patient can safely propel the wheelchair in the home or has a caregiver who can provide assistance. Length of need Lifetime. ?Accessories: elevating leg rests (ELRs), wheel locks, extensions and anti-tippers.  ? 12/26/21 1003  ? ?  ?  ? ?  ?  ?

## 2021-12-26 NOTE — TOC Transition Note (Addendum)
Transition of Care (TOC) - CM/SW Discharge Note ? ? ?Patient Details  ?Name: Tommy Yang ?MRN: [id number] ?Date of Birth: 10/09/1950 ? ?Transition of Care (TOC) CM/SW Contact:  ?Verdell Carmine, RN ?Phone Number: ?12/26/2021, 10:05 AM ? ? ?Clinical Narrative:    ? ?Patient lives with wife at home. He is  asking to leave and was discharged by MD. He refuses to go anywhere but home. He was verbal that he is tired of talking, agreeable to set up0 with home health and "Dont care who, just do whatever so I can get out of here, I am tired of talking" Asked if I could call his wife to converse with her. He stated no. RN with concern that he needed two assist  to get into chair.  ?Ordered DME a walker and wheelchair,HH to [facility]  when DME gets here, he is free to DC. ?1100 [employer] needing MRN number for delivery, given to them, He will get wheelchair today for transport and walker delivered to home tomorrow . ?112 apparently family set up transport, they did not let us know, wheelchair is not delivered yet. [name] placed on wheelchair as he needs to go home with it.  Via [employer]. ?1150 called [employer] again for time of wheelchair delivery. They are bringing it up now. RN informed ?1300 patient has been discharged. [facility] called to state that they cannot accept after all, Called [facility] for acceptance.they are looking at him  ?Final next level of care: [place] ?  ? ? ?Patient Goals and CMS Choice ?  ?  ?  ? ?Discharge Placement ?  ?          Home with home health ? Home with home health DME ?  ?  ?  ? ?Discharge Plan and Services ?  ?  ?           ?DME Arranged: Wheelchair manual, Walker rolling ?DME Agency: [facility] ?Date DME Agency Contacted: 12/26/21 ?Time DME Agency Contacted: [phone] ?Representative spoke with at DME Agency: Freda Munro ?HH Arranged: RN, PT, OT, Nurse's Aide ?[place] Agency: [facility] ?Date HH Agency Contacted: 12/26/21 ?Time [place]: 1001 ?Representative spoke with at  [place]: Bradd Canary ? ?Social Determinants of Health (SDOH) Interventions ?  ? ? ?Readmission Risk Interventions ?   ? View : No data to display.  ?  ?  ?  ? ? ? ? ? ?

## 2021-12-26 NOTE — Discharge Instructions (Addendum)
Hold coumadin on discharge until 01/02/2022 ?

## 2021-12-26 NOTE — Progress Notes (Signed)
Physical Therapy Treatment ?Patient Details ?Name: Tommy Yang ?MRN: [id number] ?DOB: Jan 21, 1951 ?Today's Date: 12/26/2021 ? ? ?History of Present Illness 71 yo man with hx of prostate cancer with urinary retention issues with indwelling foley and recent obstruction with UTI, DM, afib on Coumadin, Tourettes syndrome admitted after a fall with head trauma and 67m R SDH, L post scalp lac and in a-fib w/RVR. ? ?  ?PT Comments  ? ? Patient eager and anxious for d/c home today.  Not eager to engage in PT, but able to assist to stand to pull up shorts and educated wife in safety with her mechanics to help him stand.  Patient progressing some as able to stand with 1 assist, however, still not well tolerated with HR 160 and c/o SOB throughout.  Planned d/c home today.  Will benefit from HHPT at d/c.    ?Recommendations for follow up therapy are one component of a multi-disciplinary discharge planning process, led by the attending physician.  Recommendations may be updated based on patient status, additional functional criteria and insurance authorization. ? ?Follow Up Recommendations ? Home health PT ?  ?  ?Assistance Recommended at Discharge    ?Patient can return home with the following A little help with walking and/or transfers;A lot of help with bathing/dressing/bathroom;Assistance with cooking/housework;Direct supervision/assist for medications management;Assist for transportation;Help with stairs or ramp for entrance ?  ?Equipment Recommendations ? Rolling walker (2 wheels)  ?  ?Recommendations for Other Services   ? ? ?  ?Precautions / Restrictions Precautions ?Precautions: Fall ?Precaution Comments: watch BP and HR  ?  ? ?Mobility ? Bed Mobility ?  ?  ?  ?  ?  ?  ?  ?General bed mobility comments: in recliner ?  ? ?Transfers ?Overall transfer level: Needs assistance ?Equipment used: Rolling walker (2 wheels) ?Transfers: Sit to/from Stand ?Sit to Stand: Mod assist ?  ?  ?  ?  ?  ?General transfer comment: up to  stand to don shorts; wife in the room and attempted to help him stand from recliner with gait belt PT issued to pt and wife, but unable to so PT assisted with mod A from recliner and pushing foward in standing due to posterior bias and max A to don shorts.  Wife and pt report easier from lift chair at home. ?  ? ?Ambulation/Gait ?  ?  ?  ?  ?  ?  ?  ?General Gait Details: NT; pt eager for home and planned d/c as soon as wheelchair delivered to the room. ? ? ?Stairs ?  ?  ?  ?  ?  ? ? ?Wheelchair Mobility ?  ? ?Modified Rankin (Stroke Patients Only) ?  ? ? ?  ?Balance   ?Sitting-balance support: Feet supported ?Sitting balance-Leahy Scale: Fair ?Sitting balance - Comments: sitting on edge of chair ?Postural control: Posterior lean ?Standing balance support: Bilateral upper extremity supported ?Standing balance-Leahy Scale: Poor ?Standing balance comment: initially on his feet leaning back needing mod A for anterior weight shift; HR in 160's a-fib and pt c/o SOB; min A while assisted to pull up shorts ?  ?  ?  ?  ?  ?  ?  ?  ?  ?  ?  ?  ? ?  ?Cognition Arousal/Alertness: Awake/alert ?Behavior During Therapy: Anxious ?Overall Cognitive Status: Impaired/Different from baseline ?Area of Impairment: Safety/judgement, Attention, Problem solving ?  ?  ?  ?  ?  ?  ?  ?  ?  ?  Current Attention Level: Sustained ?  ?Following Commands: Follows one step commands consistently, Follows one step commands with increased time ?Safety/Judgement: Decreased awareness of safety ?  ?  ?  ?  ?  ? ?  ?Exercises   ? ?  ?General Comments General comments (skin integrity, edema, etc.): BP 106/74 in sitting.  Wife educated to stand next to pt to assist rather than in front leaning over walker due to risk for back injury. ?  ?  ? ?Pertinent Vitals/Pain Pain Assessment ?Faces Pain Scale: Hurts little more ?Pain Location: back from time in bed ?Pain Descriptors / Indicators: Aching, Tightness ?Pain Intervention(s): Monitored during session,  Repositioned, Limited activity within patient's tolerance  ? ? ?Home Living   ?  ?  ?  ?  ?  ?  ?  ?  ?  ?   ?  ?Prior Function    ?  ?  ?   ? ?PT Goals (current goals can now be found in the care plan section) Progress towards PT goals: Progressing toward goals ? ?  ?Frequency ? ? ? Min 3X/week ? ? ? ?  ?PT Plan Current plan remains appropriate  ? ? ?Co-evaluation   ?  ?  ?  ?  ? ?  ?AM-PAC PT "6 Clicks" Mobility   ?Outcome Measure ? Help needed turning from your back to your side while in a flat bed without using bedrails?: A Little ?Help needed moving from lying on your back to sitting on the side of a flat bed without using bedrails?: A Little ?Help needed moving to and from a bed to a chair (including a wheelchair)?: A Little ?Help needed standing up from a chair using your arms (e.g., wheelchair or bedside chair)?: A Lot ?Help needed to walk in hospital room?: Total ?Help needed climbing 3-5 steps with a railing? : Total ?6 Click Score: 13 ? ?  ?End of Session Equipment Utilized During Treatment: Gait belt ?Activity Tolerance: Patient limited by fatigue ?Patient left: in chair;with call bell/phone within reach;with family/visitor present ?  ?PT Visit Diagnosis: Other abnormalities of gait and mobility (R26.89);History of falling (Z91.81);Muscle weakness (generalized) (M62.81) ?  ? ? ?Time: 1004-1030 ?PT Time Calculation (min) (ACUTE ONLY): 26 min ? ?Charges:  $Therapeutic Activity: 8-22 mins ?$Self Care/Home Management: 8-22          ?          ? ?Magda Kiel, PT ?Acute Rehabilitation Services ?[phone] ?[phone] ?12/26/2021 ? ? ? ?Reginia Naas ?12/26/2021, 4:26 PM ? ?

## 2021-12-26 NOTE — Discharge Summary (Signed)
Physician Discharge Summary  Tommy Yang FYB:[id number] DOB: May 11, 1951 DOA: 12/23/2021  PCP: [name], MD  Admit date: 12/23/2021 Discharge date: 12/26/2021  Admitted From: Home Disposition: Home with home health, declined SNF  Recommendations for Outpatient Follow-up:  Follow up with PCP in 1-2 weeks Follow-up with A-fib clinic 1-2 weeks [facility] Continue Keflex on discharge complete antibiotic course for UTI Discontinue glimepiride due to significant hyperglycemia on admission Discussed with patient need to hold Coumadin for 10 days per neurosurgery recommendations given subdural hematoma, consider repeat CT head outpatient prior to reinitiation of Coumadin, earliest date 01/02/2022  Home Health: PT/OT/RN/aide, declined SNF placement Equipment/Devices: Rolling walker  Discharge Condition: Stable CODE STATUS: Full code Diet recommendation: Heart healthy/consistent carb regular diet  History of present illness:  Tommy Yang is a 71 year old male with past medical history significant for metastatic prostate cancer, type 2 diabetes mellitus, paroxysmal atrial fibrillation on anticoagulation outpatient with Coumadin who presented to [facility] ED via EMS on 4/24 following a fall.  Reported that he was attempting to get up from his recliner, total with his wife that he felt weak and then fell.  Denies loss of consciousness.  Patient was noted to be hypoglycemic on EMS arrival with CBG 32, and was given an amp of D50.  Patient was also noted to be in A-fib with RVR with heart rates 170-180 in route and was given 10 mg IV Cardizem.  In the ED, CT head significant for 8 mm subdural hematoma, right cerebral convexly study with trace subarachnoid hemorrhage and left posterior scalp laceration.  Patient was started on a D10 infusion, vitamin K and Kcentra given to reverse Coumadin.  Patient was admitted to the critical care service.   Neurosurgery was consulted and recommended holding anticoagulation for 10 days with repeat head CT prior to reinitiation.  Patient was started on amiodarone drip for control of his atrial fibrillation.  Patient was transferred to the hospitalist service on 12/25/2021.   Hospital course:  Subdural hematoma Patient presenting from home via EMS following fall without loss of consciousness.  CT head with without contrast notable for 8 mm subdural hematoma, right cerebral convexity with trace subarachnoid hemorrhage.  Patient was given vitamin K and Kcentra for anticoagulation reversal. Patient was evaluated by neurosurgery, Dr. Zada Finders; no intervention required and recommended holding anticoagulation for 10 days prior to reinitiation with follow-up CT. Holding Coumadin until at least 01/02/2022 repeat head CT prior to reinitiation.  Outpatient follow-up with PCP/neurosurgery.   Paroxysmal atrial fibrillation with RVR On EMS arrival, patient was noted to have a heart rate in the 170s-180s.  Home regimen includes metoprolol tartrate 100 mg p.o. twice daily, amiodarone 2 mg p.o. daily and Coumadin.  INR 6.6 on admission; supratherapeutic, reversed with vitamin K and Kcentra.  Continue to hold Coumadin on discharge as above.  Outpatient follow-up with A-fib clinic, Roderic Palau, NP.   Hypoglycemic event, POA Hx type 2 diabetes mellitus Patient was found to have blood glucose of 32 on EMS arrival.  Etiology likely secondary to his fall with underlying subdural hematoma.  Home regimen includes glimepiride 1 mg p.o. every morning, semaglutide 0.5 mg once weekly on Friday, and Empagliflozin-metformin 12.5 mg - 1000 mg p.o. twice daily.  Hemoglobin A1c 6.9 on 10/10/2021, well controlled.  Initially required D10 infusion during initial hospitalization, now titrated off.  Discontinue glimepiride.   Metastatic prostate cancer with urinary retention Hematuria Urinary tract infection. Seen by urology, Dr.  Herrick  while inpatient.  Recently exchanged in the urology clinic last week.  Urology recommends continue antibiotics for presumed UTI and no further recommendations at this point.  Can follow-up as scheduled urology in clinic for catheter exchange. Tamsulosin 0.4 mg p.o. twice daily.  Continue antibiotics with Keflex on discharge to complete course for UTI.   Anemia Etiology likely secondary to supratherapeutic INR of 6.6 on arrival complicated by hematuria and SDH.  Hemoglobin 7.0 on arrival trended down to a low of 6.6.  Transfused 4 unit PRBCs on 12/23/2021.  Hemoglobin 7.6 at time of discharge.  Recommend repeat CBC 1 week.   Hyponatremia Sodium 132 on admission.  Likely secondary to volume overload/anasarca. Continue furosemide 20 mg p.o. daily.  Sodium 135 at time of discharge.  Repeat BMP 1 week.   Hypocalcemia Calcium 6.9, corrected for albumin 1.5 is equal to 8.9.  Did receive calcium gluconate 1 g IV ordered by PCCM. BMP with albumin in 1 week   Elevated TSH TSH 7.677 on 12/24/2021.  T41.01, within normal limits.   Anasarca Furosemide 20 mg p.o. daily   Hyperlipidemia Atorvastatin 80 mg p.o. daily   GERD: Continue Protonix 40 mg p.o. daily      Discharge Diagnoses:  Principal Problem:   Subdural hematoma (HCC) Active Problems:   Bilateral lower extremity edema   Prostate cancer (HCC)   Paroxysmal atrial fibrillation (HCC)   Hypoglycemia secondary to sulfonylurea    Discharge Instructions  Discharge Instructions     Call MD for:  difficulty breathing, headache or visual disturbances   Complete by: As directed    Call MD for:  extreme fatigue   Complete by: As directed    Call MD for:  persistant dizziness or light-headedness   Complete by: As directed    Call MD for:  persistant nausea and vomiting   Complete by: As directed    Call MD for:  severe uncontrolled pain   Complete by: As directed    Call MD for:  temperature >100.4   Complete by: As directed    Diet  - low sodium heart healthy   Complete by: As directed    Increase activity slowly   Complete by: As directed       Allergies as of 12/26/2021       Reactions   Shingrix [zoster Vac Recomb Adjuvanted] Shortness Of Breath, Other (See Comments)   Pulse increased to 170+ and it affected the A-Fib        Medication List     STOP taking these medications    amoxicillin-clavulanate 875-125 MG tablet Commonly known as: AUGMENTIN   glimepiride 1 MG tablet Commonly known as: AMARYL       TAKE these medications    amiodarone 200 MG tablet Commonly known as: PACERONE Take 1 tablet (200 mg total) by mouth daily.   atorvastatin 80 MG tablet Commonly known as: LIPITOR TAKE 1 TABLET BY MOUTH EVERY DAY What changed: when to take this   blood glucose meter kit and supplies Kit Test blood sugar daily as directed. Dx code: E11.9   CALCIUM PO Take 600 mg by mouth at bedtime.   cephALEXin 250 MG capsule Commonly known as: KEFLEX Take 1 capsule (250 mg total) by mouth every 6 (six) hours for 5 days.   diphenoxylate-atropine 2.5-0.025 MG tablet Commonly known as: LOMOTIL TAKE 1 TABLET BY MOUTH 4 (FOUR) TIMES DAILY AS NEEDED FOR DIARRHEA OR LOOSE STOOLS.   Erleada 60 MG tablet Generic  drug: apalutamide Take 240 mg by mouth daily at 4 PM. May be taken with or without food. Swallow tablets whole.   folic acid 1 MG tablet Commonly known as: FOLVITE Take 1 tablet (1 mg total) by mouth daily.   furosemide 20 MG tablet Commonly known as: LASIX Take 20 mg by mouth daily. What changed: Another medication with the same name was removed. Continue taking this medication, and follow the directions you see here.   glucose blood test strip Commonly known as: Accu-Chek Guide Use as instructed to check blood sugar twice daily.   metoprolol tartrate 100 MG tablet Commonly known as: LOPRESSOR Take 0.5 tablets (50 mg total) by mouth 2 (two) times daily for 1 day, THEN 1 tablet (100 mg  total) 2 (two) times daily for 1 day. Start taking on: November 28, 2021 What changed: See the new instructions.   Ozempic (0.25 or 0.5 MG/DOSE) 2 MG/1.5ML Sopn Generic drug: Semaglutide(0.25 or 0.5MG/DOS) Inject 0.5 mg into the skin once a week. What changed: when to take this   pantoprazole 40 MG tablet Commonly known as: PROTONIX TAKE 1 TABLET BY MOUTH EVERY DAY What changed:  how to take this when to take this   Synjardy XR 12.12-998 MG Tb24 Generic drug: Empagliflozin-metFORMIN HCl ER TAKE 2 TABLETS BY MOUTH EVERY DAY What changed:  how much to take how to take this when to take this additional instructions   tamsulosin 0.4 MG Caps capsule Commonly known as: FLOMAX Take 1 capsule (0.4 mg total) by mouth 2 (two) times daily.   VITAMIN B 12 PO Take 125 mcg by mouth at bedtime.   VITAMIN D3 PO Take 4,000 Units by mouth at bedtime.   warfarin 5 MG tablet Commonly known as: COUMADIN Take as directed. If you are unsure how to take this medication, talk to your nurse or doctor. Original instructions: Take as directed by anticoagulation clinic Start taking on: Jan 03, 2022 What changed: These instructions start on Jan 03, 2022. If you are unsure what to do until then, ask your doctor or other care provider.               Durable Medical Equipment  (From admission, onward)           Start     Ordered   12/26/21 0912  For home use only DME Walker rolling  Once       Question Answer Comment  Walker: With [place] [employer]   Patient needs a walker to treat with the following condition Weakness      12/26/21 0911   12/25/21 1240  For home use only DME Walker rolling  Once       Question Answer Comment  Walker: With [place]   Patient needs a walker to treat with the following condition Gait abnormality      12/25/21 1239            Follow-up Information     [name], MD. Schedule an appointment as soon as possible for a visit in 1 week(s).    Specialties: Internal Medicine, Radiology Contact information: [place] [address] [phone]         Pixie Casino, MD .   Specialty: Cardiology Contact information: [address] [place] [address] [phone]         Sherran Needs, NP. Schedule an appointment as soon as possible for a visit in 1 week(s).  Specialties: Nurse Practitioner, Cardiology Contact information: [place] [address] [phone]         Ardis Hughs, MD. Schedule an appointment as soon as possible for a visit in 2 week(s).   Specialty: Urology Contact information: [place] [address] [phone]                Allergies  Allergen Reactions   Shingrix [Zoster Vac Recomb Adjuvanted] Shortness Of Breath and Other (See Comments)    Pulse increased to 170+ and it affected the A-Fib    Consultations: PCCM Neurosurgery, Dr. Zada Finders   Procedures/Studies: CT Head Wo Contrast  Result Date: 12/23/2021 CLINICAL DATA:  Level 2 fall on blood thinners EXAM: CT HEAD WITHOUT CONTRAST TECHNIQUE: Contiguous axial images were obtained from the base of the skull through the vertex without intravenous contrast. RADIATION DOSE REDUCTION: This exam was performed according to the departmental dose-optimization program which includes automated exposure control, adjustment of the mA and/or kV according to patient size and/or use of iterative reconstruction technique. COMPARISON:  None. FINDINGS: Brain: Mixed density subdural hematoma along the right cerebral hemisphere greater lateral than along the falx and measuring up to 8 mm in thickness. Suspect small volume subarachnoid or subdural hemorrhage along the left temporal pole. No evidence of infarct, hydrocephalus, or mass lesion. No measurable midline shift Vascular: Negative Skull: Left posterior scalp contusion.  No acute fracture Sinuses/Orbits: No visible injury  Other: Critical Value/emergent results were called by telephone at the time of interpretation on 12/23/2021 at 5:18 am to provider [facility] , who verbally acknowledged these results. IMPRESSION: 1. Subdural hematoma along the right cerebral convexity measuring up to 8 mm in thickness. 2. Suspect trace subarachnoid or subdural blood along the left temporal convexity. 3. Left posterior scalp laceration. Electronically Signed   By: Jorje Guild M.D.   On: 12/23/2021 05:23   DG Chest Port 1 View  Result Date: 12/23/2021 CLINICAL DATA:  Level 2 trauma, fall on blood thinners EXAM: PORTABLE CHEST 1 VIEW COMPARISON:  11/25/2021 FINDINGS: Hazy density at the bases which is likely pleural fluid and atelectasis. Extensive patchy skeletal sclerosis known from prior CT imaging. Normal heart size. No visible pneumothorax. Extensive artifact from EKG leads. IMPRESSION: Hazy density at the bases, likely small volume pleural fluid and atelectasis. Electronically Signed   By: Jorje Guild M.D.   On: 12/23/2021 05:31     Subjective: Patient seen examined bedside, resting comfortably.  No specific complaints this morning.  States does not want to continue to stay in the hospital and demanding to go home.  Denies any dizziness on ambulation.  Declines SNF placement or acute inpatient rehabilitation.  States that his heart rate normally increases when he moves around the bed or ambulates and this is a chronic thing for him and followed by the A-fib clinic.  No other specific questions or concerns at this time.  Denies headache, no dizziness, no chest pain, no palpitations, no shortness of breath, no abdominal pain, no weakness, no fatigue, no fever/chills/night sweats, no nausea/vomiting/diarrhea, no paresthesias.  No acute events overnight per nursing staff.  Discharge Exam: Vitals:   12/26/21 0700 12/26/21 0800  BP: 106/74 106/76  Pulse: (!) 119 (!) 133  Resp: 12 (!) 24  Temp:  98.3 F (36.8 C)  SpO2: 96%  92%   Vitals:   12/26/21 0500 12/26/21 0600 12/26/21 0700 12/26/21 0800  BP: 106/65 115/69 106/74 106/76  Pulse: (!) 116 (!)  120 (!) 119 (!) 133  Resp: (!) 23 (!) 23 12 (!) 24  Temp:    98.3 F (36.8 C)  TempSrc:    Oral  SpO2: 95% 96% 96% 92%  Weight:      Height:        Physical Exam: GEN: NAD, alert and oriented x 3, chronically ill in appearance HEENT: NCAT, PERRL, EOMI, sclera clear, MMM PULM: CTAB w/o wheezes/crackles, normal respiratory effort, on room air CV: RRR w/o M/G/R GI: abd soft, NTND, NABS, no R/G/M MSK: 2+ pitting edema to bilateral lower extremities to mid shin, muscle strength globally intact 5/5 bilateral upper/lower extremities NEURO: CN II-XII intact, no focal deficits, sensation to light touch intact PSYCH: normal mood/affect Integumentary: dry/intact, no rashes or wounds    The results of significant diagnostics from this hospitalization (including imaging, microbiology, ancillary and laboratory) are listed below for reference.     Microbiology: Recent Results (from the past 240 hour(s))  Urine Culture     Status: None   Collection Time: 12/23/21  4:49 AM   Specimen: Urine, Catheterized  Result Value Ref Range Status   Specimen Description URINE, CATHETERIZED  Final   Special Requests NONE  Final   Culture   Final    NO GROWTH Performed at [facility], [address] [place], [place] [address]    Report Status 12/24/2021 FINAL  Final  MRSA Next Gen by PCR, Nasal     Status: None   Collection Time: 12/23/21  8:20 AM   Specimen: Nasal Mucosa; Nasal Swab  Result Value Ref Range Status   MRSA by PCR Next Gen NOT DETECTED NOT DETECTED Final    Comment: (NOTE) The GeneXpert MRSA Assay (FDA approved for NASAL specimens only), is one component of a comprehensive MRSA colonization surveillance program. It is not intended to diagnose MRSA infection nor to guide or monitor treatment for MRSA infections. Test performance is not FDA approved in  patients less than 14 years old. Performed at [facility], [place] [address], [place], [place] [address]      Labs: BNP (last 3 results) Recent Labs    09/06/21 1553 11/28/21 0406  BNP 219.2* 098.1*   Basic Metabolic Panel: Recent Labs  Lab 12/23/21 0449 12/23/21 0457 12/23/21 0959 12/23/21 1305 12/24/21 0347 12/25/21 0347 12/26/21 0701  NA 132* 133*  --  132* 130* 131* 135  K 4.1 4.2  --  3.9 4.1 3.8 3.8  CL 106 104  --  107 105 105 108  CO2 18*  --   --  19* 18* 18* 18*  GLUCOSE 54* 35*  --  48* 140* 147* 131*  BUN 19 21  --  _0 CREATININE 0.80 0.60*  --  0.77 0.93 0.95 0.92  CALCIUM 8.1*  --   --  7.6* 7.4* 6.9* 7.0*  MG  --   --  1.4*  --   --  1.8 1.9   Liver Function Tests: Recent Labs  Lab 12/23/21 0449 12/23/21 1305 12/25/21 0347  AST 15 14* 15  ALT _1 ALKPHOS 112 107 120  BILITOT 0.5 0.5 0.3  PROT 5.8* 5.1* 5.3*  ALBUMIN <1.5* <1.5* <1.5*   No results for input(s): LIPASE, AMYLASE in the last 168 hours. No results for input(s): AMMONIA in the last 168 hours. CBC: Recent Labs  Lab 12/23/21 0449 12/23/21 0457 12/23/21 1632 12/23/21 2210 12/24/21 0347 12/25/21 0347 12/26/21 0701  WBC  8.1   < > 5.9 5.2 4.9 5.6 6.1  NEUTROABS 6.5  --   --   --   --   --   --   HGB 7.0*   < > 6.8* 7.8* 7.5* 8.1* 7.6*  HCT 24.6*   < > 22.1* 24.3* 24.5* 25.0* 24.8*  MCV 99.6   < > 94.0 93.8 95.0 92.6 97.3  PLT 69*   < > 53* 50* 48* 52* 58*   < > = values in this interval not displayed.   Cardiac Enzymes: No results for input(s): CKTOTAL, CKMB, CKMBINDEX, TROPONINI in the last 168 hours. BNP: Invalid input(s): POCBNP CBG: Recent Labs  Lab 12/25/21 1556 12/25/21 1908 12/25/21 2307 12/26/21 0325 12/26/21 0755  GLUCAP 144* 137* 146* 129* 131*   D-Dimer No results for input(s): DDIMER in the last 72 hours. Hgb A1c No results for input(s): HGBA1C in the last 72 hours. Lipid Profile No results for input(s): CHOL, HDL, LDLCALC, TRIG,  CHOLHDL, LDLDIRECT in the last 72 hours. Thyroid function studies Recent Labs    12/24/21 1827  TSH 7.677*   Anemia work up No results for input(s): VITAMINB12, FOLATE, FERRITIN, TIBC, IRON, RETICCTPCT in the last 72 hours. Urinalysis    Component Value Date/Time   COLORURINE RED (A) 12/23/2021 0640   APPEARANCEUR RED (A) 12/23/2021 0640   LABSPEC  12/23/2021 0640    TEST NOT REPORTED DUE TO COLOR INTERFERENCE OF URINE PIGMENT   PHURINE  12/23/2021 0640    TEST NOT REPORTED DUE TO COLOR INTERFERENCE OF URINE PIGMENT   GLUCOSEU (A) 12/23/2021 0640    TEST NOT REPORTED DUE TO COLOR INTERFERENCE OF URINE PIGMENT   GLUCOSEU >=1000 (A) 11/22/2019 0850   HGBUR (A) 12/23/2021 0640    TEST NOT REPORTED DUE TO COLOR INTERFERENCE OF URINE PIGMENT   BILIRUBINUR (A) 12/23/2021 0640    TEST NOT REPORTED DUE TO COLOR INTERFERENCE OF URINE PIGMENT   BILIRUBINUR neg 12/14/2013 0810   KETONESUR (A) 12/23/2021 0640    TEST NOT REPORTED DUE TO COLOR INTERFERENCE OF URINE PIGMENT   PROTEINUR (A) 12/23/2021 0640    TEST NOT REPORTED DUE TO COLOR INTERFERENCE OF URINE PIGMENT   UROBILINOGEN 0.2 11/22/2019 0850   NITRITE (A) 12/23/2021 0640    TEST NOT REPORTED DUE TO COLOR INTERFERENCE OF URINE PIGMENT   LEUKOCYTESUR (A) 12/23/2021 0640    TEST NOT REPORTED DUE TO COLOR INTERFERENCE OF URINE PIGMENT   Sepsis Labs Invalid input(s): PROCALCITONIN,  WBC,  LACTICIDVEN Microbiology Recent Results (from the past 240 hour(s))  Urine Culture     Status: None   Collection Time: 12/23/21  4:49 AM   Specimen: Urine, Catheterized  Result Value Ref Range Status   Specimen Description URINE, CATHETERIZED  Final   Special Requests NONE  Final   Culture   Final    NO GROWTH Performed at [facility], [address] [place], [place] [address]    Report Status 12/24/2021 FINAL  Final  MRSA Next Gen by PCR, Nasal     Status: None   Collection Time: 12/23/21  8:20 AM   Specimen: Nasal Mucosa; Nasal  Swab  Result Value Ref Range Status   MRSA by PCR Next Gen NOT DETECTED NOT DETECTED Final    Comment: (NOTE) The GeneXpert MRSA Assay (FDA approved for NASAL specimens only), is one component of a comprehensive MRSA colonization surveillance program. It is not intended to diagnose MRSA infection nor to guide or monitor  treatment for MRSA infections. Test performance is not FDA approved in patients less than 37 years old. Performed at [facility], [place] [address] [place], [place] [address]      Time coordinating discharge: Over 30 minutes  SIGNED:   Olanrewaju Osborn J [place] DO  [facility] 12/26/2021, 9:17 AM

## 2021-12-26 NOTE — Consult Note (Addendum)
? ?  [facility] CM Inpatient Consult ? ? ?12/26/2021 ? ?[name] ?1951-06-20 ?[id number] ? ?[facility] [ACO] Patient: Medicare ACO Reach ? ?Primary Care Provider:  [name], MD at [facility], [facility] is an embedded provider with a Chronic Care Management team and program, and is listed for the transition of care follow up and appointments. ? ?Patient currently listed as ICU level of care was screened for transition noted today.  Patient also assess for less than 30 days readmission with a high risk for unplanned readmission score.  Review included follow up needs assess for Embedded practice service for chronic care management.  ?Electronic Med record review reveals patient desires to transition home and PT/OT recommendations reviewed for post hospital care needs. Secure chat information sent to inpatient [facility] RNCM for post hospital f/u plan with Embedded team request for additional community follow up support. ? ?Plan: A referral request is planned for the [facility] provider/team make aware of TOC needs for post hospital care. ? ?Please contact for further questions, ? ?Natividad Brood, RN BSN CCM ?[facility] Liaison ? [phone] business mobile phone ?Toll free office [phone]  ?Fax number: [phone] ?[email or web address] ? ? ? ?

## 2021-12-26 NOTE — Progress Notes (Signed)
Occupational Therapy Treatment ?Patient Details ?Name: ADA WOODBURY ?MRN: [id number] ?DOB: [birth year] ?Today's Date: 12/26/2021 ? ? ?History of present illness 71 yo man with hx of prostate cancer with urinary retention issues with indwelling foley and recent obstruction with UTI, DM, afib on Coumadin, Tourettes syndrome admitted after a fall with head trauma and 7m R SDH, L post scalp lac and in a-fib w/RVR. ?  ?OT comments ? Patient continues to express fear regarding moving between surfaces.  Makes a lot of excuses as to why he is weak and unable to perform close to his baseline.  He does get a lot of assist at home from his spouse, but OT is not certain she would be able to provide the potential physical assist needed to transition home safely.  The patient is adamant that he wants to return home with home health services, and a wheelchair.  OT would recommend post acute OT at a local SNF to improve his mobility closer to his baseline, and at a slower pace versus AIR.  OT will follow in the acute setting.    ? ?Recommendations for follow up therapy are one component of a multi-disciplinary discharge planning process, led by the attending physician.  Recommendations may be updated based on patient status, additional functional criteria and insurance authorization. ?   ?Follow Up Recommendations ? Skilled nursing-short term rehab (<3 hours/day), or HTuckerOT if he returns home.   ?  ?Assistance Recommended at Discharge Frequent or constant Supervision/Assistance.  20" wide wheelchair with elevating leg rests and regular length arm rests.  20" foam/gel cushion for the wheelchair.    ?Patient can return home with the following ? A lot of help with walking and/or transfers;A lot of help with bathing/dressing/bathroom;Help with stairs or ramp for entrance;Assist for transportation;Direct supervision/assist for medications management;Direct supervision/assist for financial management;Assistance with cooking/housework ?   ?Equipment Recommendations ? None recommended by OT  ?  ?Recommendations for Other Services   ? ?  ?Precautions / Restrictions Precautions ?Precautions: Fall ?Precaution Comments: watch BP and HR ?Restrictions ?Weight Bearing Restrictions: No  ? ? ?  ? ?Mobility Bed Mobility ?Overal bed mobility: Needs Assistance ?Bed Mobility: Supine to Sit ?  ?  ?Supine to sit: HOB elevated, Min assist ?  ?  ?General bed mobility comments: increased time and effort ?  ? ?Transfers ?Overall transfer level: Needs assistance ?Equipment used: None ?Transfers: Sit to/from Stand, Bed to chair/wheelchair/BSC ?Sit to Stand: Min assist, From elevated surface ?  ?  ?Step pivot transfers: Min assist ?  ?  ?  ?  ?  ?Balance Overall balance assessment: Needs assistance ?Sitting-balance support: Feet supported ?Sitting balance-Leahy Scale: Fair ?  ?  ?Standing balance support: Bilateral upper extremity supported ?Standing balance-Leahy Scale: Poor ?  ?  ?  ?  ?  ?  ?  ?  ?  ?  ?  ?  ?   ? ?ADL either performed or assessed with clinical judgement  ? ?ADL   ?  ?  ?  ?  ?  ?  ?  ?  ?  ?  ?  ?  ?  ?  ?  ?  ?  ?  ?  ?  ?  ? ?Extremity/Trunk Assessment Upper Extremity Assessment ?Upper Extremity Assessment: Generalized weakness ?  ?Lower Extremity Assessment ?Lower Extremity Assessment: Defer to PT evaluation ?  ?Cervical / Trunk Assessment ?Cervical / Trunk Assessment: Kyphotic ?  ? ?Vision   ?  ?  ?  Perception   ?  ?Praxis   ?  ? ?Cognition Arousal/Alertness: Awake/alert ?Behavior During Therapy: [facility] for tasks assessed/performed ?Overall Cognitive Status: Impaired/Different from baseline ?  ?  ?  ?  ?  ?  ?  ?  ?  ?  ?  ?  ?  ?Safety/Judgement: Decreased awareness of safety ?  ?  ?  ?  ?  ?   ?Exercises Other Exercises ?Other Exercises: AA chest press seated: 1 set and 10 reps ?Other Exercises: AA circles forward and backwards:1 set and 10 reps seated ?Other Exercises: AA shoulder press:1 set and 10 reps seated ? ?  ?Shoulder Instructions   ? ? ?   ?General Comments    ? ? ?Pertinent Vitals/ Pain       Pain Assessment ?Pain Assessment: No/denies pain ?Pain Intervention(s): Monitored during session ? ?Home Living   ?  ?  ?  ?  ?  ?  ?  ?  ?  ?  ?  ?  ?  ?  ?  ?  ?  ?  ? ?  ?Prior Functioning/Environment    ?  ?  ?  ?   ? ?Frequency ? Min 2X/week  ? ? ? ? ?  ?Progress Toward Goals ? ?OT Goals(current goals can now be found in the care plan section) ? Progress towards OT goals: Progressing toward goals ? ?Acute Rehab OT Goals ?Patient Stated Goal: Return home ?OT Goal Formulation: With patient ?Time For Goal Achievement: 01/08/22 ?Potential to Achieve Goals: Good  ?Plan Other (comment) (Patient would benefit from SNF for ST rehab prior to returning home)   ? ?Co-evaluation ? ? ?   ?  ?  ?  ?  ? ?  ?AM-PAC OT "6 Clicks" Daily Activity     ?Outcome Measure ? ? Help from another person eating meals?: A Little ?Help from another person taking care of personal grooming?: A Little ?Help from another person toileting, which includes using toliet, bedpan, or urinal?: A Lot ?Help from another person bathing (including washing, rinsing, drying)?: A Lot ?Help from another person to put on and taking off regular upper body clothing?: A Little ?Help from another person to put on and taking off regular lower body clothing?: A Lot ?6 Click Score: 15 ? ?  ?End of Session   ? ?OT Visit Diagnosis: Unsteadiness on feet (R26.81);Other abnormalities of gait and mobility (R26.89);Muscle weakness (generalized) (M62.81) ?  ?Activity Tolerance Other (comment) (self limits) ?  ?Patient Left in chair;with call bell/phone within reach;with chair alarm set ?  ?Nurse Communication Need for lift equipment ?  ? ?   ? ?Time: 1191-4782 ?OT Time Calculation (min): 23 min ? ?Charges: OT General Charges ?$OT Visit: 1 Visit ?OT Treatments ?$Self Care/Home Management : 23-37 mins ? ?12/26/2021 ? ?RP, OTR/L ? ?Acute Rehabilitation Services ? ?Office:  [phone] ? ? ?Dameian Crisman D Khole Arterburn ?12/26/2021,  9:34 AM ? ? ?

## 2021-12-27 ENCOUNTER — Telehealth: Payer: Self-pay | Admitting: *Deleted

## 2021-12-27 NOTE — Chronic Care Management (AMB) (Signed)
?  Care Management  ? ?Note ? ?12/27/2021 ?Name: Tommy Yang MRN: [id number] DOB: Aug 03, 1951 ? ?Tommy Yang is a 71 y.o. year old male who is a primary care patient of [name], MD. I reached out to [name] by phone today offer care coordination services.  ? ?[name] was given information about care management services today including:  ?Care management services include personalized support from designated clinical staff supervised by his physician, including individualized plan of care and coordination with other care providers ?24/7 contact phone numbers for assistance for urgent and routine care needs. ?The patient may stop care management services at any time by phone call to the office staff. ? ?Patient agreed to services and verbal consent obtained.  ? ?Follow up plan: ?Telephone appointment with care management team member scheduled for:01/01/22 ? ?Laverda Sorenson  ?Care Guide, Embedded Care Coordination ?[facility]  Care Management  ?Direct Dial: [phone] ? ?

## 2021-12-30 ENCOUNTER — Encounter (HOSPITAL_COMMUNITY): Payer: Self-pay | Admitting: Internal Medicine

## 2021-12-30 ENCOUNTER — Inpatient Hospital Stay (HOSPITAL_COMMUNITY)
Admission: EM | Admit: 2021-12-30 | Discharge: 2022-01-03 | DRG: 309 | Disposition: A | Discharge status: Hospice | Payer: BC Managed Care – PPO | Attending: Internal Medicine | Admitting: Internal Medicine

## 2021-12-30 ENCOUNTER — Inpatient Hospital Stay (HOSPITAL_COMMUNITY): Payer: BC Managed Care – PPO

## 2021-12-30 ENCOUNTER — Other Ambulatory Visit: Payer: Self-pay

## 2021-12-30 ENCOUNTER — Emergency Department (HOSPITAL_COMMUNITY): Payer: BC Managed Care – PPO

## 2021-12-30 DIAGNOSIS — Z86718 Personal history of other venous thrombosis and embolism: Secondary | ICD-10-CM | POA: Diagnosis not present

## 2021-12-30 DIAGNOSIS — E1100 Type 2 diabetes mellitus with hyperosmolarity without nonketotic hyperglycemic-hyperosmolar coma (NKHHC): Secondary | ICD-10-CM | POA: Diagnosis not present

## 2021-12-30 DIAGNOSIS — D696 Thrombocytopenia, unspecified: Secondary | ICD-10-CM | POA: Diagnosis present

## 2021-12-30 DIAGNOSIS — I10 Essential (primary) hypertension: Secondary | ICD-10-CM | POA: Diagnosis not present

## 2021-12-30 DIAGNOSIS — N1330 Unspecified hydronephrosis: Secondary | ICD-10-CM | POA: Diagnosis present

## 2021-12-30 DIAGNOSIS — I3139 Other pericardial effusion (noninflammatory): Secondary | ICD-10-CM | POA: Diagnosis present

## 2021-12-30 DIAGNOSIS — R739 Hyperglycemia, unspecified: Secondary | ICD-10-CM | POA: Diagnosis not present

## 2021-12-30 DIAGNOSIS — Z9181 History of falling: Secondary | ICD-10-CM

## 2021-12-30 DIAGNOSIS — E119 Type 2 diabetes mellitus without complications: Secondary | ICD-10-CM

## 2021-12-30 DIAGNOSIS — Z825 Family history of asthma and other chronic lower respiratory diseases: Secondary | ICD-10-CM

## 2021-12-30 DIAGNOSIS — C7951 Secondary malignant neoplasm of bone: Secondary | ICD-10-CM | POA: Diagnosis present

## 2021-12-30 DIAGNOSIS — F952 Tourette's disorder: Secondary | ICD-10-CM | POA: Diagnosis present

## 2021-12-30 DIAGNOSIS — I959 Hypotension, unspecified: Secondary | ICD-10-CM

## 2021-12-30 DIAGNOSIS — I4821 Permanent atrial fibrillation: Principal | ICD-10-CM | POA: Diagnosis present

## 2021-12-30 DIAGNOSIS — E440 Moderate protein-calorie malnutrition: Secondary | ICD-10-CM | POA: Diagnosis not present

## 2021-12-30 DIAGNOSIS — Z7189 Other specified counseling: Secondary | ICD-10-CM

## 2021-12-30 DIAGNOSIS — R531 Weakness: Principal | ICD-10-CM

## 2021-12-30 DIAGNOSIS — R319 Hematuria, unspecified: Secondary | ICD-10-CM | POA: Diagnosis not present

## 2021-12-30 DIAGNOSIS — R29898 Other symptoms and signs involving the musculoskeletal system: Secondary | ICD-10-CM | POA: Diagnosis not present

## 2021-12-30 DIAGNOSIS — N179 Acute kidney failure, unspecified: Secondary | ICD-10-CM

## 2021-12-30 DIAGNOSIS — L309 Dermatitis, unspecified: Secondary | ICD-10-CM | POA: Diagnosis present

## 2021-12-30 DIAGNOSIS — D630 Anemia in neoplastic disease: Secondary | ICD-10-CM | POA: Diagnosis present

## 2021-12-30 DIAGNOSIS — D649 Anemia, unspecified: Secondary | ICD-10-CM

## 2021-12-30 DIAGNOSIS — Z87820 Personal history of traumatic brain injury: Secondary | ICD-10-CM

## 2021-12-30 DIAGNOSIS — I4891 Unspecified atrial fibrillation: Secondary | ICD-10-CM | POA: Diagnosis present

## 2021-12-30 DIAGNOSIS — M2550 Pain in unspecified joint: Secondary | ICD-10-CM | POA: Diagnosis not present

## 2021-12-30 DIAGNOSIS — Z66 Do not resuscitate: Secondary | ICD-10-CM

## 2021-12-30 DIAGNOSIS — K219 Gastro-esophageal reflux disease without esophagitis: Secondary | ICD-10-CM | POA: Diagnosis present

## 2021-12-30 DIAGNOSIS — Z83438 Family history of other disorder of lipoprotein metabolism and other lipidemia: Secondary | ICD-10-CM

## 2021-12-30 DIAGNOSIS — Z515 Encounter for palliative care: Secondary | ICD-10-CM

## 2021-12-30 DIAGNOSIS — Z8546 Personal history of malignant neoplasm of prostate: Secondary | ICD-10-CM | POA: Diagnosis not present

## 2021-12-30 DIAGNOSIS — Z923 Personal history of irradiation: Secondary | ICD-10-CM

## 2021-12-30 DIAGNOSIS — I480 Paroxysmal atrial fibrillation: Secondary | ICD-10-CM

## 2021-12-30 DIAGNOSIS — Z950 Presence of cardiac pacemaker: Secondary | ICD-10-CM

## 2021-12-30 DIAGNOSIS — Z960 Presence of urogenital implants: Secondary | ICD-10-CM | POA: Diagnosis present

## 2021-12-30 DIAGNOSIS — R5381 Other malaise: Secondary | ICD-10-CM | POA: Diagnosis not present

## 2021-12-30 DIAGNOSIS — E861 Hypovolemia: Secondary | ICD-10-CM | POA: Diagnosis not present

## 2021-12-30 DIAGNOSIS — E860 Dehydration: Secondary | ICD-10-CM | POA: Diagnosis present

## 2021-12-30 DIAGNOSIS — J9811 Atelectasis: Secondary | ICD-10-CM | POA: Diagnosis present

## 2021-12-30 DIAGNOSIS — Z7984 Long term (current) use of oral hypoglycemic drugs: Secondary | ICD-10-CM

## 2021-12-30 DIAGNOSIS — Z888 Allergy status to other drugs, medicaments and biological substances status: Secondary | ICD-10-CM

## 2021-12-30 DIAGNOSIS — Z833 Family history of diabetes mellitus: Secondary | ICD-10-CM

## 2021-12-30 DIAGNOSIS — Z20822 Contact with and (suspected) exposure to covid-19: Secondary | ICD-10-CM | POA: Diagnosis not present

## 2021-12-30 DIAGNOSIS — E785 Hyperlipidemia, unspecified: Secondary | ICD-10-CM | POA: Diagnosis present

## 2021-12-30 DIAGNOSIS — J189 Pneumonia, unspecified organism: Secondary | ICD-10-CM

## 2021-12-30 DIAGNOSIS — L89159 Pressure ulcer of sacral region, unspecified stage: Secondary | ICD-10-CM

## 2021-12-30 DIAGNOSIS — I9589 Other hypotension: Secondary | ICD-10-CM | POA: Diagnosis not present

## 2021-12-30 DIAGNOSIS — R54 Age-related physical debility: Secondary | ICD-10-CM | POA: Diagnosis present

## 2021-12-30 DIAGNOSIS — L89152 Pressure ulcer of sacral region, stage 2: Secondary | ICD-10-CM | POA: Diagnosis not present

## 2021-12-30 DIAGNOSIS — C61 Malignant neoplasm of prostate: Secondary | ICD-10-CM | POA: Diagnosis not present

## 2021-12-30 DIAGNOSIS — E7800 Pure hypercholesterolemia, unspecified: Secondary | ICD-10-CM | POA: Diagnosis present

## 2021-12-30 DIAGNOSIS — Z7985 Long-term (current) use of injectable non-insulin antidiabetic drugs: Secondary | ICD-10-CM

## 2021-12-30 DIAGNOSIS — Z7401 Bed confinement status: Secondary | ICD-10-CM | POA: Diagnosis not present

## 2021-12-30 DIAGNOSIS — R339 Retention of urine, unspecified: Secondary | ICD-10-CM

## 2021-12-30 DIAGNOSIS — Z6831 Body mass index (BMI) 31.0-31.9, adult: Secondary | ICD-10-CM | POA: Diagnosis not present

## 2021-12-30 DIAGNOSIS — S065XAA Traumatic subdural hemorrhage with loss of consciousness status unknown, initial encounter: Secondary | ICD-10-CM | POA: Diagnosis present

## 2021-12-30 DIAGNOSIS — R6510 Systemic inflammatory response syndrome (SIRS) of non-infectious origin without acute organ dysfunction: Secondary | ICD-10-CM

## 2021-12-30 DIAGNOSIS — Z8249 Family history of ischemic heart disease and other diseases of the circulatory system: Secondary | ICD-10-CM

## 2021-12-30 DIAGNOSIS — R310 Gross hematuria: Secondary | ICD-10-CM | POA: Diagnosis not present

## 2021-12-30 DIAGNOSIS — Z809 Family history of malignant neoplasm, unspecified: Secondary | ICD-10-CM

## 2021-12-30 DIAGNOSIS — Z79899 Other long term (current) drug therapy: Secondary | ICD-10-CM

## 2021-12-30 DIAGNOSIS — Z87891 Personal history of nicotine dependence: Secondary | ICD-10-CM

## 2021-12-30 DIAGNOSIS — E782 Mixed hyperlipidemia: Secondary | ICD-10-CM | POA: Diagnosis not present

## 2021-12-30 DIAGNOSIS — R000 Tachycardia, unspecified: Secondary | ICD-10-CM | POA: Diagnosis not present

## 2021-12-30 DIAGNOSIS — Z9079 Acquired absence of other genital organ(s): Secondary | ICD-10-CM

## 2021-12-30 LAB — MRSA NEXT GEN BY PCR, NASAL: MRSA by PCR Next Gen: NOT DETECTED

## 2021-12-30 LAB — CBC WITH DIFFERENTIAL/PLATELET
Abs Immature Granulocytes: 0.16 10*3/uL — ABNORMAL HIGH (ref 0.00–0.07)
Basophils Absolute: 0 10*3/uL (ref 0.0–0.1)
Basophils Relative: 0 %
Eosinophils Absolute: 0 10*3/uL (ref 0.0–0.5)
Eosinophils Relative: 0 %
HCT: 27.6 % — ABNORMAL LOW (ref 39.0–52.0)
Hemoglobin: 8.5 g/dL — ABNORMAL LOW (ref 13.0–17.0)
Immature Granulocytes: 3 %
Lymphocytes Relative: 6 %
Lymphs Abs: 0.3 10*3/uL — ABNORMAL LOW (ref 0.7–4.0)
MCH: 30.7 pg (ref 26.0–34.0)
MCHC: 30.8 g/dL (ref 30.0–36.0)
MCV: 99.6 fL (ref 80.0–100.0)
Monocytes Absolute: 0.3 10*3/uL (ref 0.1–1.0)
Monocytes Relative: 5 %
Neutro Abs: 4.6 10*3/uL (ref 1.7–7.7)
Neutrophils Relative %: 86 %
Platelets: 61 10*3/uL — ABNORMAL LOW (ref 150–400)
RBC: 2.77 MIL/uL — ABNORMAL LOW (ref 4.22–5.81)
RDW: 21.4 % — ABNORMAL HIGH (ref 11.5–15.5)
WBC: 5.4 10*3/uL (ref 4.0–10.5)
nRBC: 0.7 % — ABNORMAL HIGH (ref 0.0–0.2)

## 2021-12-30 LAB — URINALYSIS, ROUTINE W REFLEX MICROSCOPIC: RBC / HPF: >50 RBC/hpf — ABNORMAL HIGH (ref 0–5)

## 2021-12-30 LAB — COMPREHENSIVE METABOLIC PANEL
ALT: 13 U/L (ref 0–44)
AST: 21 U/L (ref 15–41)
Albumin: 1.7 g/dL — ABNORMAL LOW (ref 3.5–5.0)
Alkaline Phosphatase: 232 U/L — ABNORMAL HIGH (ref 38–126)
Anion gap: 9 (ref 5–15)
BUN: 38 mg/dL — ABNORMAL HIGH (ref 8–23)
CO2: 17 mmol/L — ABNORMAL LOW (ref 22–32)
Calcium: 7.3 mg/dL — ABNORMAL LOW (ref 8.9–10.3)
Chloride: 109 mmol/L (ref 98–111)
Creatinine, Ser: 1.33 mg/dL — ABNORMAL HIGH (ref 0.61–1.24)
GFR, Estimated: 58 mL/min — ABNORMAL LOW (ref >60)
Glucose, Bld: 235 mg/dL — ABNORMAL HIGH (ref 70–99)
Potassium: 4.6 mmol/L (ref 3.5–5.1)
Sodium: 135 mmol/L (ref 135–145)
Total Bilirubin: 0.9 mg/dL (ref 0.3–1.2)
Total Protein: 6.3 g/dL — ABNORMAL LOW (ref 6.5–8.1)

## 2021-12-30 LAB — ECHOCARDIOGRAM COMPLETE
AR max vel: 1.9 cm2
AV Peak grad: 6.5 mmHg
Ao pk vel: 1.27 m/s
Area-P 1/2: 4.6 cm2
Calc EF: 56.8 %
Height: 71 in
S' Lateral: 3.3 cm
Single Plane A2C EF: 58.4 %
Single Plane A4C EF: 57.1 %
Weight: 3664 oz

## 2021-12-30 LAB — PROTIME-INR
INR: 1.7 — ABNORMAL HIGH (ref 0.8–1.2)
Prothrombin Time: 19.3 seconds — ABNORMAL HIGH (ref 11.4–15.2)

## 2021-12-30 LAB — TYPE AND SCREEN
ABO/RH(D): A NEG
Antibody Screen: NEGATIVE

## 2021-12-30 LAB — LACTIC ACID, PLASMA
Lactic Acid, Venous: 1.9 mmol/L (ref 0.5–1.9)
Lactic Acid, Venous: 1.3 mmol/L (ref 0.5–1.9)

## 2021-12-30 LAB — RESP PANEL BY RT-PCR (FLU A&B, COVID) ARPGX2
Influenza A by PCR: NEGATIVE
Influenza B by PCR: NEGATIVE
SARS Coronavirus 2 by RT PCR: NEGATIVE

## 2021-12-30 LAB — GLUCOSE, CAPILLARY: Glucose-Capillary: 167 mg/dL — ABNORMAL HIGH (ref 70–99)

## 2021-12-30 LAB — TROPONIN I (HIGH SENSITIVITY)
Troponin I (High Sensitivity): 7 ng/L (ref <18)
Troponin I (High Sensitivity): 5 ng/L (ref <18)

## 2021-12-30 MED ORDER — CHLORHEXIDINE GLUCONATE CLOTH 2 % EX PADS
6.0000 | MEDICATED_PAD | Freq: Every day | CUTANEOUS | Status: DC
  Administered 2021-12-30 – 2022-01-02 (×4): 6 via TOPICAL

## 2021-12-30 MED ORDER — FOLIC ACID 1 MG PO TABS
1.0000 mg | ORAL_TABLET | Freq: Every day | ORAL | Status: DC
Start: 2021-12-31 — End: 2022-01-03
  Administered 2021-12-31 – 2022-01-03 (×4): 1 mg via ORAL
  Filled 2021-12-30 (×4): qty 1

## 2021-12-30 MED ORDER — VANCOMYCIN HCL 2000 MG/400ML IV SOLN
2000.0000 mg | Freq: Once | INTRAVENOUS | Status: AC
  Administered 2021-12-30: 2000 mg via INTRAVENOUS
  Filled 2021-12-30: qty 400

## 2021-12-30 MED ORDER — INSULIN ASPART 100 UNIT/ML IJ SOLN
0.0000 [IU] | Freq: Three times a day (TID) | INTRAMUSCULAR | Status: DC
  Administered 2021-12-31 – 2022-01-01 (×6): 3 [IU] via SUBCUTANEOUS
  Administered 2022-01-02: 5 [IU] via SUBCUTANEOUS
  Administered 2022-01-02 – 2022-01-03 (×3): 2 [IU] via SUBCUTANEOUS

## 2021-12-30 MED ORDER — CEFEPIME HCL 2 G IV SOLR
2.0000 g | Freq: Three times a day (TID) | INTRAVENOUS | Status: DC
  Administered 2021-12-30 – 2021-12-31 (×3): 2 g via INTRAVENOUS
  Filled 2021-12-30 (×3): qty 12.5

## 2021-12-30 MED ORDER — ATORVASTATIN CALCIUM 40 MG PO TABS
80.0000 mg | ORAL_TABLET | Freq: Every morning | ORAL | Status: DC
  Administered 2021-12-31 – 2022-01-03 (×4): 80 mg via ORAL
  Filled 2021-12-30 (×4): qty 2

## 2021-12-30 MED ORDER — ACETAMINOPHEN 650 MG RE SUPP: 650.0000 mg | Freq: Four times a day (QID) | RECTAL | Status: DC | PRN

## 2021-12-30 MED ORDER — AMIODARONE HCL IN DEXTROSE 360-4.14 MG/200ML-% IV SOLN
60.0000 mg/h | INTRAVENOUS | Status: AC
  Administered 2021-12-30: 60 mg/h via INTRAVENOUS
  Filled 2021-12-30: qty 200

## 2021-12-30 MED ORDER — ACETAMINOPHEN 325 MG PO TABS
650.0000 mg | ORAL_TABLET | Freq: Four times a day (QID) | ORAL | Status: DC | PRN
  Administered 2022-01-02: 650 mg via ORAL
  Filled 2021-12-30: qty 2

## 2021-12-30 MED ORDER — PANTOPRAZOLE SODIUM 40 MG PO TBEC
40.0000 mg | DELAYED_RELEASE_TABLET | Freq: Every morning | ORAL | Status: DC
  Administered 2021-12-31 – 2022-01-03 (×4): 40 mg via ORAL
  Filled 2021-12-30 (×4): qty 1

## 2021-12-30 MED ORDER — ENSURE ENLIVE PO LIQD: 237.0000 mL | Freq: Two times a day (BID) | ORAL | Status: DC

## 2021-12-30 MED ORDER — SODIUM CHLORIDE 0.9 % IV BOLUS
1000.0000 mL | Freq: Once | INTRAVENOUS | Status: AC
  Administered 2021-12-30: 1000 mL via INTRAVENOUS

## 2021-12-30 MED ORDER — SODIUM CHLORIDE 0.9 % IV SOLN
2.0000 g | Freq: Once | INTRAVENOUS | Status: AC
  Administered 2021-12-30: 2 g via INTRAVENOUS
  Filled 2021-12-30: qty 12.5

## 2021-12-30 MED ORDER — LACTATED RINGERS IV BOLUS
1000.0000 mL | Freq: Once | INTRAVENOUS | Status: AC
  Administered 2021-12-30: 1000 mL via INTRAVENOUS

## 2021-12-30 MED ORDER — INSULIN ASPART 100 UNIT/ML IJ SOLN: 0.0000 [IU] | Freq: Every day | INTRAMUSCULAR | Status: DC

## 2021-12-30 MED ORDER — LACTATED RINGERS IV BOLUS
1000.0000 mL | Freq: Once | INTRAVENOUS | Status: DC
Start: 2021-12-30 — End: 2021-12-30

## 2021-12-30 MED ORDER — ONDANSETRON HCL 4 MG PO TABS: 4.0000 mg | ORAL_TABLET | Freq: Four times a day (QID) | ORAL | Status: DC | PRN

## 2021-12-30 MED ORDER — AMIODARONE LOAD VIA INFUSION
150.0000 mg | Freq: Once | INTRAVENOUS | Status: AC
  Administered 2021-12-30: 150 mg via INTRAVENOUS
  Filled 2021-12-30: qty 83.34

## 2021-12-30 MED ORDER — ONDANSETRON HCL 4 MG/2ML IJ SOLN: 4.0000 mg | Freq: Four times a day (QID) | INTRAMUSCULAR | Status: DC | PRN

## 2021-12-30 MED ORDER — METRONIDAZOLE 500 MG/100ML IV SOLN
500.0000 mg | Freq: Two times a day (BID) | INTRAVENOUS | Status: DC
  Administered 2021-12-30 – 2021-12-31 (×4): 500 mg via INTRAVENOUS
  Filled 2021-12-30 (×4): qty 100

## 2021-12-30 MED ORDER — AMIODARONE HCL IN DEXTROSE 360-4.14 MG/200ML-% IV SOLN
30.0000 mg/h | INTRAVENOUS | Status: DC
Start: 2021-12-30 — End: 2022-01-01
  Administered 2021-12-30 – 2022-01-01 (×5): 30 mg/h via INTRAVENOUS
  Filled 2021-12-30 (×6): qty 200

## 2021-12-30 NOTE — Progress Notes (Signed)
A consult was received from an ED physician for Vancomycin, Cefepime per pharmacy dosing.  The patient's profile has been reviewed for ht/wt/allergies/indication/available labs.   ?A one time order has been placed for Vancomycin 2g, Cefepime 2g.  Further antibiotics/pharmacy consults should be ordered by admitting physician if indicated.       ?                ?Thank you, ?Gretta Arab PharmD, BCPS ?Clinical Pharmacist ?[facility] [phone] ?12/30/2021 10:27 AM ? ?

## 2021-12-30 NOTE — H&P (Addendum)
History and Physical    Patient: Tommy Yang QMV:[id number] DOB: [birth year] DOA: 12/30/2021 DOS: the patient was seen and examined on 12/30/2021 PCP: Corwin Levins, MD  Patient coming from: Home  Chief Complaint:  Chief Complaint  Patient presents with   Weakness   HPI: Tommy Yang is a 71 y.o. male with medical history significant of a fib, recent SDH, HLD, DM2. Prostate CA w/ bone mets, urinary retention. Presenting with generalized weakness. He was recently discharged from the hospital for a visit involving a fall and SDH. At the time he was found to be supratherapeutic on coumadin and it was held at discharge. Since being home, his wife notes that he has become progressively more weak. He becomes short of breath with movement. He has not had an appetite, and thus, his PO intake has been poor. She has been having difficulty caring for him at home as he has become closer and closer to total assist needs. He is now too weak to sit up. He has not had any fever, N/V/D, chest pain. When his symptoms did not improve this morning, she decided to bring him to the ED for evaluation.   Of note, he reports chronic hematuria. He says he's had multiple antibiotics for UTI, but the bleeding never improves. He otherwise denies any aggravating or alleviating factors.   Review of Systems: As mentioned in the history of present illness. All other systems reviewed and are negative. 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 seed implant and radiation   Tourette's syndrome    Urinary retention    Wears glasses    Past Surgical History:  Procedure Laterality Date   APPENDECTOMY  1968   BALLOON DILATION N/A 08/02/2015   Procedure: BALLOON DILATION;  Surgeon: Rachael Fee, MD;  Location: [facility] END[facility];  Service: Endoscopy;  Laterality: N/A;   CARDIOVERSION N/A 05/13/2018   Procedure: CARDIOVERSION;  Surgeon: Thurmon Fair, MD;  Location: [facility] ENDOSCOPY;  Service: Cardiovascular;  Laterality: N/A;   CARDIOVERSION N/A 05/18/2018   Procedure: CARDIOVERSION;  Surgeon: Jodelle Red, MD;  Location: [facility] ENDOSCOPY;  Service: Cardiovascular;  Laterality: N/A;   COLONOSCOPY WITH PROPOFOL N/A 08/02/2015   Procedure: COLONOSCOPY WITH PROPOFOL w/ APC;  Surgeon: Rachael Fee, MD;  Location: [facility];  Service: Endoscopy;  Laterality: N/A;   CYSTOSCOPY N/A 10/05/2020   Procedure: CYSTOSCOPY;  Surgeon: Sebastian Ache, MD;  Location: [facility];  Service: Urology;  Laterality: N/A;   ESOPHAGOGASTRODUODENOSCOPY (EGD) WITH PROPOFOL N/A 08/02/2015   Procedure: ESOPHAGOGASTRODUODENOSCOPY (EGD) WITH PROPOFOL/ possible dilation.;  Surgeon: Rachael Fee, MD;  Location: [facility] ENDOSCOPY;  Service: Endoscopy;  Laterality: N/A;   KNEE SURGERY  2001   right knee orthoscopic   PROSTATE BIOPSY  12/06/13   Gleason 4+3=7,  volume 35 gm   SPINE SURGERY  1963 and 1967   Tourette'ssyndrome spinal fluid removed    TONSILLECTOMY  as child   TRANSURETHRAL RESECTION OF PROSTATE N/A 09/30/2019   Procedure: TRANSURETHRAL RESECTION OF THE PROSTATE (TURP);  Surgeon: Sebastian Ache, MD;  Location: [facility];  Service: Urology;  Laterality: N/A;  1 HR   TRANSURETHRAL RESECTION OF PROSTATE N/A 10/05/2020   Procedure: TRANSURETHRAL RESECTION OF THE PROSTATE (TURP);  Surgeon: Sebastian Ache,  MD;  Location: [facility];  Service: Urology;  Laterality: N/A;  1 HR   Social History:  reports that he quit smoking about 9 years ago. His smoking use included cigarettes. He has a 11.50 pack-year smoking history. He has never used smokeless tobacco. He reports that he does not currently use alcohol. He reports that he does not use drugs.  Allergies  Allergen Reactions   Shingrix [Zoster Vac Recomb Adjuvanted] Shortness Of Breath and Other (See Comments)    Pulse increased to 170+ and it affected the A-Fib    Family History  Problem Relation Age of Onset   Heart disease Mother        before age 57   Diabetes Mother    Hyperlipidemia Mother    Varicose Veins Mother    Hypertension Mother    Heart disease Father    Diabetes Father    Hyperlipidemia Father    Hypertension Father    Cancer Brother    COPD Brother    Diabetes Brother    Hyperlipidemia Brother    Hypertension Brother     Prior to Admission medications   Medication Sig Start Date End Date Taking? Authorizing Provider  Acetaminophen (TYLENOL PO) Take 2 tablets by mouth every 6 (six) hours as needed (pain/headache).   Yes [provider]  amiodarone (PACERONE) 200 MG tablet Take 1 tablet (200 mg total) by mouth daily. 11/21/21  Yes Newman Nip, NP  apalutamide (ERLEADA) 60 MG tablet Take 240 mg by mouth daily at 4 PM. May be taken with or without food. Swallow tablets whole.   Yes [provider]  atorvastatin (LIPITOR) 80 MG tablet TAKE 1 TABLET BY MOUTH EVERY DAY Patient taking differently: Take 80 mg by mouth every morning. 11/13/21  Yes Reather Littler, MD  Calcium Carbonate (CALCIUM 600 PO) Take 600 mg by mouth at bedtime.   Yes [provider]  cephALEXin (KEFLEX) 250 MG capsule Take 1 capsule (250 mg total) by mouth every 6 (six) hours for 5 days. 12/26/21 12/31/21 Yes [place], Alvira Philips, DO  Cholecalciferol (VITAMIN D3) 50 MCG (2000 UT) TABS Take 4,000 Units by mouth at  bedtime.   Yes [provider]  Cyanocobalamin (VITAMIN B 12 PO) Take 125 mcg by mouth at bedtime.   Yes [provider]  diphenoxylate-atropine (LOMOTIL) 2.5-0.025 MG tablet TAKE 1 TABLET BY MOUTH 4 (FOUR) TIMES DAILY AS NEEDED FOR DIARRHEA OR LOOSE STOOLS. 08/02/21  Yes Corwin Levins, MD  Empagliflozin-metFORMIN HCl ER (SYNJARDY XR) 12.12-998 MG TB24 TAKE 2 TABLETS BY MOUTH EVERY DAY Patient taking differently: Take 1 tablet by mouth 2 (two) times daily with a meal. 12/02/21  Yes Almon Hercules, MD  folic acid (FOLVITE) 1 MG tablet Take 1 tablet (1 mg total) by mouth daily. 11/29/21  Yes Almon Hercules, MD  furosemide (LASIX) 20 MG tablet Take 20 mg by mouth every morning.   Yes [provider]  metoprolol tartrate (LOPRESSOR) 100 MG tablet Take  0.5 tablets (50 mg total) by mouth 2 (two) times daily for 1 day, THEN 1 tablet (100 mg total) 2 (two) times daily for 1 day. Patient taking differently: Take 100mg  by mouth twice daily 11/28/21 12/30/21 Yes Gonfa, Taye T, MD  pantoprazole (PROTONIX) 40 MG tablet TAKE 1 TABLET BY MOUTH EVERY DAY Patient taking differently: 40 mg every morning. 11/18/21  Yes Corwin Levins, MD  Semaglutide,0.25 or 0.5MG /DOS, (OZEMPIC, 0.25 OR 0.5 MG/DOSE,) 2 MG/1.5ML SOPN Inject 0.5 mg into the skin once a week. Patient taking differently: Inject 0.5 mg into the skin every Friday. 07/10/21  Yes Reather Littler, MD  tamsulosin (FLOMAX) 0.4 MG CAPS capsule Take 1 capsule (0.4 mg total) by mouth 2 (two) times daily. 09/24/21  Yes Newman Nip, NP  blood glucose meter kit and supplies KIT Test blood sugar daily as directed. Dx code: E11.9 01/23/15   Collene Gobble, MD  glucose blood (ACCU-CHEK GUIDE) test strip Use as instructed to check blood sugar twice daily. 10/29/18   Reather Littler, MD  warfarin (COUMADIN) 5 MG tablet Take as directed by anticoagulation clinic Patient not taking: Reported on 12/30/2021 01/03/22   [place], Eric J, DO    Physical Exam: Vitals:    12/30/21 1017 12/30/21 1030 12/30/21 1115 12/30/21 1200  BP: 96/68 94/67 91/63  100/71  Pulse: (!) 130   (!) 121  Resp: 15 18 (!) 25 (!) 21  Temp:      TempSrc:      SpO2: 94%   94%  Weight:      Height:       General: 71 y.o. male resting in bed in NAD Eyes: PERRL, normal sclera ENMT: Nares patent w/o discharge, orophaynx clear, dentition normal, ears w/o discharge/lesions/ulcers Neck: Supple, trachea midline Cardiovascular: tachy irregular, +S1, S2, no m/g/r, equal pulses throughout Respiratory: CTABL, no w/r/r, normal WOB GI: BS+, obese, soft, NT, no masses noted, no organomegaly noted MSK: No c/c; BLE edema; sacral ulceratoin Neuro: A&O x 3, no focal deficits Psyc: Appropriate interaction and affect, calm/cooperative  Data Reviewed:  CO2  17 Glucose  235 BUN  38 Scr  1.33 Lactic acid  1.9 Hgb 8.5 Plt  61 INR  1.7  Assessment and Plan: No notes have been filed under this hospital service. Service: Hospitalist A fib RVR     - admit to inpt, SDU     - EDP has started amio gtt; continue for now; cardiology consulted by EDP; appreciate assistance     - continue fluids for now as pressures remain soft      - Trp is 7, trend; no chest pain     - EKG as above     - echo ordered     - holding anticoagulation d/t recent SDH; per previous neurosurgery recs, he is to hold until 01/02/22  ?BLL PNA SIRS     - CXR w/ questionable BLL PNA     - he was started on broad spec abx in ED; check MRSA swab, if negative can drop vanc     - only potential source we have is his lung and decub ulcer; will continue cefepime/flagyl for now  Sacral decubitus ulcer     - as above     - WOCN  Persistent hematuria Urinary retention     - reports he's had hematuria for weeks despite abx for UTI     - spoke w/ urology (Dr. Wilson Singer); nothing to do right now     -  continue foley  Debility     - PT/OT when he is medically stabilized  Prostate cancer w/ mets to bone     - continue follow up  with onco  AKI     - fluids     - renal US     - watch nephrotoxins  Normocytic anemia Chronic Thrombocytopenia     - no evidence of new bleed     - check iron studies  Recent SDH     - occurred after fall     - per neurosurgery during last visit; anticoagulation to be held until 5.4.23  HTN     - hold home regimen d/t hypotension  HLD     - continue statin  DM2     - SSI, glucose checks, DM diet   Advance Care Planning:   Code Status: FULL  Consults: EDP consulted cardiology; I have consulted urology  Family Communication: w/ wife at bedside  Severity of Illness: The appropriate patient status for this patient is INPATIENT. Inpatient status is judged to be reasonable and necessary in order to provide the required intensity of service to ensure the patient's safety. The patient's presenting symptoms, physical exam findings, and initial radiographic and laboratory data in the context of their chronic comorbidities is felt to place them at high risk for further clinical deterioration. Furthermore, it is not anticipated that the patient will be medically stable for discharge from the hospital within 2 midnights of admission.   * I certify that at the point of admission it is my clinical judgment that the patient will require inpatient hospital care spanning beyond 2 midnights from the point of admission due to high intensity of service, high risk for further deterioration and high frequency of surveillance required.*  Author: Teddy Spike, DO 12/30/2021 12:07 PM  For on call review [email or web address].

## 2021-12-30 NOTE — Progress Notes (Signed)
Pharmacy Antibiotic Note ? ?Tommy Yang is a 71 y.o. male admitted on 12/30/2021 with sepsis.  Pharmacy has been consulted for Cefepime dosing. ? ?Plan: ?Cefepime 2g IV q8h ?Dosage remains stable and need for further dosage adjustment appears unlikely at present.  Pharmacy will sign off at this time.  Please reconsult if a change in clinical status warrants re-evaluation of dosage. ? ? ?Height: '5\' 11"'$  (180.3 cm) ?Weight: 103.9 kg (229 lb) ?IBW/kg (Calculated) : 75.3 ? ?Temp (24hrs), Avg:97.5 ?F (36.4 ?C), Min:97.5 ?F (36.4 ?C), Max:97.5 ?F (36.4 ?C) ? ?Recent Labs  ?Lab 12/23/21 ?2210 12/24/21 ?3846 12/25/21 ?6599 12/26/21 ?0701 12/30/21 ?1021 12/30/21 ?1022  ?WBC 5.2 4.9 5.6 6.1 5.4  --   ?CREATININE  --  0.93 0.95 0.92 1.33*  --   ?LATICACIDVEN  --   --   --   --   --  1.9  ?  ?Estimated Creatinine Clearance: 64.3 mL/min (A) (by C-G formula based on SCr of 1.33 mg/dL (H)).   ? ?Allergies  ?Allergen Reactions  ? Shingrix [Zoster Vac Recomb Adjuvanted] Shortness Of Breath and Other (See Comments)  ?  Pulse increased to 170+ and it affected the A-Fib  ? ? ?Antimicrobials this admission: ?5/1 Vancomycin x1 ?5/1 Cefepime >>  ? ?Dose adjustments this admission: ? ? ?Microbiology results: ?5/1 BCx: ?5/1 UCx:  ?Resp panel:  ? ?Thank you for allowing pharmacy to be a part of this patient?s care. ? ?Gretta Arab PharmD, BCPS ?Clinical Pharmacist ?[facility] [phone] ?12/30/2021 1:35 PM ? ? ?

## 2021-12-30 NOTE — ED Provider Notes (Signed)
?[facility] DEPT ?Provider Note ? ? ?CSN: [id number] ?Arrival date & time: 12/30/21  0932 ? ?  ? ?History ? ?Chief Complaint  ?Patient presents with  ? Weakness  ? ? ?Tommy Yang is a 71 y.o. male. ? ?HPI ? ?  ? ?71 year old male with a history of metastatic prostate cancer, type 2 diabetes, prior DVT, paroxysmal atrial fibrillation previously on Coumadin as an outpatient which is currently being held due to recent admission to the hospital for fall with subdural hematoma/subarachnoid hemorrhage, hypoglycemia, A-fib with RVR, urinary retention/hematuria/urinary tract infection on Keflex who presents with concern for continued generalized weakness, found to be in A-fib with RVR with hypotensive blood pressures. ? ?Reports he is here because he has continued generalized weakness since he left the hospital.  Reports before admission he was able to walk with walker but has not been able to since then> Reports worsening buttock pain related to pressure ulcer, his wife having a difficult time caring for him.  Denies chest pain, dyspnea. ? ?Reports he has had hematuria, has been drinking water for that and thinks that has triggered some diarrhea.  Also reports he has had low appetite since leaving the hospital and has not been eating or drinking as well.  Denies fevers, nausea, vomiting.   Has rash and pain to buttock.  Reports wife can no longer take care of him at home due to weakness.  ? ?Past Medical History:  ?Diagnosis Date  ? Arthritis   ? Atrial fibrillation ([place])   ? Complication of anesthesia   ? needs to sit up  at 45 degree angle or gets afib during surgery or in recovery  ? Diabetes mellitus without complication ([place])   ? type 2  ? DVT (deep venous thrombosis) ([place]) 2015  ? left leg.behind knee  ? Foley catheter in place last changed 2eeks ago  ? last 7 months  ? GERD (gastroesophageal reflux disease)   ? Hx of radiation therapy 40 tx 2015  ? Hypercholesterolemia   ?  Hypertension   ? Malignant neoplasm of prostate metastatic to bone ([place]) 04/30/2021  ? Prostate cancer ([place]) 12/06/13  ? gleason 4+3=7, 6/12 cores positive. seed implant and radiation  ? Tourette's syndrome   ? Urinary retention   ? Wears glasses   ?  ?Home Medications ?Prior to Admission medications   ?Medication Sig Start Date End Date Taking? Authorizing Provider  ?Acetaminophen (TYLENOL PO) Take 2 tablets by mouth every 6 (six) hours as needed (pain/headache).   Yes [provider]  ?amiodarone (PACERONE) 200 MG tablet Take 1 tablet (200 mg total) by mouth daily. 11/21/21  Yes Sherran Needs, NP  ?apalutamide (ERLEADA) 60 MG tablet Take 240 mg by mouth daily at 4 PM. May be taken with or without food. Swallow tablets whole.   Yes [provider]  ?atorvastatin (LIPITOR) 80 MG tablet TAKE 1 TABLET BY MOUTH EVERY DAY ?Patient taking differently: Take 80 mg by mouth every morning. 11/13/21  Yes Elayne Snare, MD  ?Calcium Carbonate (CALCIUM 600 PO) Take 600 mg by mouth at bedtime.   Yes [provider]  ?cephALEXin (KEFLEX) 250 MG capsule Take 1 capsule (250 mg total) by mouth every 6 (six) hours for 5 days. 12/26/21 12/31/21 Yes [place] Donnamarie Poag, DO  ?Cholecalciferol (VITAMIN D3) 50 MCG (2000 UT) TABS Take 4,000 Units by mouth at bedtime.   Yes [provider]  ?Cyanocobalamin (VITAMIN B 12 PO) Take 125 mcg by mouth  at bedtime.   Yes [provider]  ?diphenoxylate-atropine (LOMOTIL) 2.5-0.025 MG tablet TAKE 1 TABLET BY MOUTH 4 (FOUR) TIMES DAILY AS NEEDED FOR DIARRHEA OR LOOSE STOOLS. 08/02/21  Yes [name], MD  ?Empagliflozin-metFORMIN HCl ER (SYNJARDY XR) 12.12-998 MG TB24 TAKE 2 TABLETS BY MOUTH EVERY DAY ?Patient taking differently: Take 1 tablet by mouth 2 (two) times daily with a meal. 12/02/21  Yes Mercy Riding, MD  ?folic acid (FOLVITE) 1 MG tablet Take 1 tablet (1 mg total) by mouth daily. 11/29/21  Yes Mercy Riding, MD  ?furosemide (LASIX) 20 MG tablet Take 20 mg  by mouth every morning.   Yes [provider]  ?metoprolol tartrate (LOPRESSOR) 100 MG tablet Take 0.5 tablets (50 mg total) by mouth 2 (two) times daily for 1 day, THEN 1 tablet (100 mg total) 2 (two) times daily for 1 day. ?Patient taking differently: Take 137m by mouth twice daily 11/28/21 12/30/21 Yes Gonfa, TCharlesetta Ivory MD  ?pantoprazole (PROTONIX) 40 MG tablet TAKE 1 TABLET BY MOUTH EVERY DAY ?Patient taking differently: 40 mg every morning. 11/18/21  Yes JBiagio Borg MD  ?Semaglutide,0.25 or 0.5MG/DOS, (OZEMPIC, 0.25 OR 0.5 MG/DOSE,) 2 MG/1.5ML SOPN Inject 0.5 mg into the skin once a week. ?Patient taking differently: Inject 0.5 mg into the skin every Friday. 07/10/21  Yes KElayne Snare MD  ?tamsulosin (FLOMAX) 0.4 MG CAPS capsule Take 1 capsule (0.4 mg total) by mouth 2 (two) times daily. 09/24/21  Yes CSherran Needs NP  ?blood glucose meter kit and supplies KIT Test blood sugar daily as directed. Dx code: E11.9 01/23/15   DDarlyne Russian MD  ?glucose blood (ACCU-CHEK GUIDE) test strip Use as instructed to check blood sugar twice daily. 10/29/18   KElayne Snare MD  ?warfarin (COUMADIN) 5 MG tablet Take as directed by anticoagulation clinic ?Patient not taking: Reported on 12/30/2021 01/03/22   [place] Eric J, DO  ?   ? ?Allergies    ?Shingrix [zoster vac recomb adjuvanted]   ? ?Review of Systems   ?Review of Systems ? ?Physical Exam ?Updated Vital Signs ?BP 91/63   Pulse (!) 130   Temp (!) 97.5 ?F (36.4 ?C) (Oral)   Resp (!) 25   Ht '5\' 11"'  (1.803 m)   Wt 103.9 kg   SpO2 94%   BMI 31.94 kg/m?  ?Physical Exam ?Vitals and nursing note reviewed.  ?Constitutional:   ?   General: He is not in acute distress. ?   Appearance: He is well-developed. He is ill-appearing. He is not diaphoretic.  ?HENT:  ?   Head: Normocephalic and atraumatic.  ?   Mouth/Throat:  ?   Mouth: Mucous membranes are dry.  ?Eyes:  ?   Conjunctiva/sclera: Conjunctivae normal.  ?Cardiovascular:  ?   Rate and Rhythm: Normal rate and regular  rhythm.  ?   Heart sounds: Normal heart sounds. No murmur heard. ?  No friction rub. No gallop.  ?Pulmonary:  ?   Effort: Pulmonary effort is normal. No respiratory distress.  ?   Breath sounds: Normal breath sounds. No wheezing or rales.  ?Abdominal:  ?   General: There is no distension.  ?   Palpations: Abdomen is soft.  ?   Tenderness: There is no abdominal tenderness. There is no guarding.  ?Musculoskeletal:  ?   Cervical back: Normal range of motion.  ?   Right lower leg: Edema present.  ?   Left lower leg: Edema present.  ?Skin: ?  General: Skin is warm and dry.  ?   Findings: Erythema (10cm area surrounding 2cm ulceration sacrum) present.  ?Neurological:  ?   Mental Status: He is alert and oriented to person, place, and time.  ? ? ?ED Results / Procedures / Treatments   ?Labs ?(all labs ordered are listed, but only abnormal results are displayed) ?Labs Reviewed  ?CBC WITH DIFFERENTIAL/PLATELET - Abnormal; Notable for the following components:  ?    Result Value  ? RBC 2.77 (*)   ? Hemoglobin 8.5 (*)   ? HCT 27.6 (*)   ? RDW 21.4 (*)   ? Platelets 61 (*)   ? nRBC 0.7 (*)   ? Lymphs Abs 0.3 (*)   ? Abs Immature Granulocytes 0.16 (*)   ? All other components within normal limits  ?COMPREHENSIVE METABOLIC PANEL - Abnormal; Notable for the following components:  ? CO2 17 (*)   ? Glucose, Bld 235 (*)   ? BUN 38 (*)   ? Creatinine, Ser 1.33 (*)   ? Calcium 7.3 (*)   ? Total Protein 6.3 (*)   ? Albumin 1.7 (*)   ? Alkaline Phosphatase 232 (*)   ? GFR, Estimated 58 (*)   ? All other components within normal limits  ?PROTIME-INR - Abnormal; Notable for the following components:  ? Prothrombin Time 19.3 (*)   ? INR 1.7 (*)   ? All other components within normal limits  ?CULTURE, BLOOD (ROUTINE X 2)  ?CULTURE, BLOOD (ROUTINE X 2)  ?URINE CULTURE  ?LACTIC ACID, PLASMA  ?LACTIC ACID, PLASMA  ?URINALYSIS, ROUTINE W REFLEX MICROSCOPIC  ?TYPE AND SCREEN  ?TROPONIN I (HIGH SENSITIVITY)  ?TROPONIN I (HIGH SENSITIVITY)   ? ? ?EKG ?EKG Interpretation ? ?Date/Time:  Monday Dec 30 2021 09:43:19 EDT ?Ventricular Rate:  139 ?PR Interval:    ?QRS Duration: 74 ?QT Interval:  332 ?QTC Calculation: 511 ?R Axis:   101 ?Text Interpretation: Atrial fibr

## 2021-12-30 NOTE — ED Triage Notes (Signed)
Ems brings pt in from home for generalized weakness. Pt reports being hospitalized for 5 days and now is too weak to walk or sit up. ?

## 2021-12-30 NOTE — ED Notes (Signed)
Cleaned small soft light brown stool, Stage 2 decub at coccyx into buttock fold, and 1/2 cm open area to rt of rectum.  Barrier cream applied after cleaning pt. ?

## 2021-12-31 ENCOUNTER — Encounter (HOSPITAL_COMMUNITY): Payer: Self-pay | Admitting: Internal Medicine

## 2021-12-31 DIAGNOSIS — N179 Acute kidney failure, unspecified: Secondary | ICD-10-CM

## 2021-12-31 DIAGNOSIS — E440 Moderate protein-calorie malnutrition: Secondary | ICD-10-CM | POA: Insufficient documentation

## 2021-12-31 DIAGNOSIS — R310 Gross hematuria: Secondary | ICD-10-CM

## 2021-12-31 DIAGNOSIS — E861 Hypovolemia: Secondary | ICD-10-CM

## 2021-12-31 DIAGNOSIS — R339 Retention of urine, unspecified: Secondary | ICD-10-CM

## 2021-12-31 DIAGNOSIS — I9589 Other hypotension: Secondary | ICD-10-CM

## 2021-12-31 DIAGNOSIS — E782 Mixed hyperlipidemia: Secondary | ICD-10-CM

## 2021-12-31 DIAGNOSIS — I4891 Unspecified atrial fibrillation: Secondary | ICD-10-CM | POA: Diagnosis not present

## 2021-12-31 LAB — CBC
HCT: 25.6 % — ABNORMAL LOW (ref 39.0–52.0)
HCT: 26.7 % — ABNORMAL LOW (ref 39.0–52.0)
Hemoglobin: 7.9 g/dL — ABNORMAL LOW (ref 13.0–17.0)
Hemoglobin: 8.2 g/dL — ABNORMAL LOW (ref 13.0–17.0)
MCH: 30.3 pg (ref 26.0–34.0)
MCH: 30.5 pg (ref 26.0–34.0)
MCHC: 30.9 g/dL (ref 30.0–36.0)
MCHC: 30.7 g/dL (ref 30.0–36.0)
MCV: 98.1 fL (ref 80.0–100.0)
MCV: 99.3 fL (ref 80.0–100.0)
Platelets: 46 10*3/uL — ABNORMAL LOW (ref 150–400)
Platelets: 46 10*3/uL — ABNORMAL LOW (ref 150–400)
RBC: 2.61 MIL/uL — ABNORMAL LOW (ref 4.22–5.81)
RBC: 2.69 MIL/uL — ABNORMAL LOW (ref 4.22–5.81)
RDW: 21.4 % — ABNORMAL HIGH (ref 11.5–15.5)
RDW: 21.6 % — ABNORMAL HIGH (ref 11.5–15.5)
WBC: 5.4 10*3/uL (ref 4.0–10.5)
WBC: 5.2 10*3/uL (ref 4.0–10.5)
nRBC: 0.7 % — ABNORMAL HIGH (ref 0.0–0.2)
nRBC: 1.1 % — ABNORMAL HIGH (ref 0.0–0.2)

## 2021-12-31 LAB — PROTIME-INR
INR: 1.5 — ABNORMAL HIGH (ref 0.8–1.2)
Prothrombin Time: 18.4 seconds — ABNORMAL HIGH (ref 11.4–15.2)

## 2021-12-31 LAB — COMPREHENSIVE METABOLIC PANEL
ALT: 13 U/L (ref 0–44)
AST: 25 U/L (ref 15–41)
Albumin: <1.5 g/dL — ABNORMAL LOW (ref 3.5–5.0)
Alkaline Phosphatase: 198 U/L — ABNORMAL HIGH (ref 38–126)
Anion gap: 10 (ref 5–15)
BUN: 37 mg/dL — ABNORMAL HIGH (ref 8–23)
CO2: 16 mmol/L — ABNORMAL LOW (ref 22–32)
Calcium: 6.7 mg/dL — ABNORMAL LOW (ref 8.9–10.3)
Chloride: 110 mmol/L (ref 98–111)
Creatinine, Ser: 1.17 mg/dL (ref 0.61–1.24)
GFR, Estimated: >60 mL/min (ref >60)
Glucose, Bld: 177 mg/dL — ABNORMAL HIGH (ref 70–99)
Potassium: 4.2 mmol/L (ref 3.5–5.1)
Sodium: 136 mmol/L (ref 135–145)
Total Bilirubin: 0.8 mg/dL (ref 0.3–1.2)
Total Protein: 5.5 g/dL — ABNORMAL LOW (ref 6.5–8.1)

## 2021-12-31 LAB — URINE CULTURE: Culture: >=100000 — AB

## 2021-12-31 LAB — GLUCOSE, CAPILLARY
Glucose-Capillary: 178 mg/dL — ABNORMAL HIGH (ref 70–99)
Glucose-Capillary: 169 mg/dL — ABNORMAL HIGH (ref 70–99)
Glucose-Capillary: 156 mg/dL — ABNORMAL HIGH (ref 70–99)
Glucose-Capillary: 139 mg/dL — ABNORMAL HIGH (ref 70–99)

## 2021-12-31 LAB — CORTISOL-AM, BLOOD: Cortisol - AM: 28.8 ug/dL — ABNORMAL HIGH (ref 6.7–22.6)

## 2021-12-31 LAB — PROCALCITONIN: Procalcitonin: 0.25 ng/mL

## 2021-12-31 MED ORDER — ADULT MULTIVITAMIN W/MINERALS CH
1.0000 | ORAL_TABLET | Freq: Every day | ORAL | Status: DC
  Administered 2021-12-31 – 2022-01-03 (×4): 1 via ORAL
  Filled 2021-12-31 (×4): qty 1

## 2021-12-31 MED ORDER — MIDODRINE HCL 5 MG PO TABS
5.0000 mg | ORAL_TABLET | Freq: Two times a day (BID) | ORAL | Status: DC
  Administered 2021-12-31 – 2022-01-01 (×3): 5 mg via ORAL
  Filled 2021-12-31 (×3): qty 1

## 2021-12-31 MED ORDER — BOOST PLUS PO LIQD
237.0000 mL | Freq: Two times a day (BID) | ORAL | Status: DC
  Administered 2022-01-01 (×2): 237 mL via ORAL
  Filled 2021-12-31 (×7): qty 237

## 2021-12-31 MED ORDER — SODIUM BICARBONATE 650 MG PO TABS
650.0000 mg | ORAL_TABLET | Freq: Two times a day (BID) | ORAL | Status: DC
  Administered 2021-12-31 – 2022-01-03 (×7): 650 mg via ORAL
  Filled 2021-12-31 (×7): qty 1

## 2021-12-31 MED ORDER — DIGOXIN 125 MCG PO TABS
0.1250 mg | ORAL_TABLET | Freq: Every day | ORAL | Status: DC
  Administered 2021-12-31 – 2022-01-03 (×4): 0.125 mg via ORAL
  Filled 2021-12-31 (×4): qty 1

## 2021-12-31 NOTE — Progress Notes (Signed)
Per patient foley catheter was last replaced during prior hospital stay. Per patient estimation it was placed around two weeks ago. Date not currently on drainage bag and seal is not intact. Urology is to assess today. No obstructing clots throughout the night.  ?

## 2021-12-31 NOTE — Progress Notes (Signed)
Initial Nutrition Assessment ? ?DOCUMENTATION CODES:  ? ?Non-severe (moderate) malnutrition in context of chronic illness ? ?INTERVENTION:  ?- will d/c Ensure and will order Boost Plus BID, each supplement provides 360 kcal and 14 grams of protein. ? ?- will order 1 tablet multivitamin with minerals/day. ? ?- allow foods from outside of the hospital given poor appetite and poor oral intake. ? ? ?NUTRITION DIAGNOSIS:  ? ?Moderate Malnutrition related to chronic illness, cancer and cancer related treatments as evidenced by mild fat depletion, mild muscle depletion, moderate muscle depletion. ? ?GOAL:  ? ?Patient will meet greater than or equal to 90% of their needs ? ?MONITOR:  ? ?PO intake, Supplement acceptance, Labs, Weight trends ? ?REASON FOR ASSESSMENT:  ? ?Malnutrition Screening Tool ? ?ASSESSMENT:  ? ?71 y.o. male with medical history of afib, recent SDH, HLD, type 2 DM, HTN, arthritis, tourette?s syndrome, prostate cancer with bone mets, and urinary retention. He presented to the ED due to generalized progressive weakness, shortness of breath with exertion, and poor appetite and PO intakes. ? ?Patient laying in bed and wife arrived a short time after RD entered patient's room. RD had met with patient and his wife on 11/26/21. Patient was admitted at that time due to SDH. ? ?At that time, patient had a very good appetite. His wife shares that more recently his appetite has been very poor and he has not been eating. Patient has had ongoing taste alteration (bland taste) and has been consuming things such as yogurt, milkshakes, and Boost in the mornings. He prefers Boost (chocolate) over Ensure.  ? ?Patient has a cane and walker at home and prefers to use his walker recently.  ? ?He denies abdominal pain, cramping, nausea. He denies oral or esophageal pain.  ? ?Weight today is documented as 225 lb and weight appears stable since 10/15/21, but noted degree of edema to all extremities. Weight on 09/06/21 was 233 lb.   ? ? ?Labs reviewed; pending HgbA1c, CBGs: 178 and 169 mg/dl, BUN: 37 mg/dl, Ca: 6.7 mg/dl, Alk Phos elevated but down from 5/1. ? ?Medications reviewed; 1 mg folvite/day, sliding scale novolog, 40 mg oral protonix/day, 650 mg oral sodium bicarb BID.  ?  ? ?NUTRITION - FOCUSED PHYSICAL EXAM: ? ?Flowsheet Row Most Recent Value  ?Orbital Region Mild depletion  ?Upper Arm Region Mild depletion  ?Thoracic and Lumbar Region Unable to assess  ?Buccal Region Mild depletion  ?Temple Region Mild depletion  ?Clavicle Bone Region Moderate depletion  ?Clavicle and Acromion Bone Region Moderate depletion  ?Scapular Bone Region Mild depletion  ?Dorsal Hand No depletion  ?Patellar Region No depletion  ?Anterior Thigh Region No depletion  ?Posterior Calf Region No depletion  ?Edema (RD Assessment) Severe  [moderate to BUE,  severe/deep pitting to BLE]  ?Hair Reviewed  ?Eyes Reviewed  ?Mouth Reviewed  ?Skin Reviewed  ?Nails Reviewed  ? ?  ? ? ?Diet Order:   ?Diet Order   ? ?       ?  Diet Carb Modified Fluid consistency: Thin; Room service appropriate? Yes  Diet effective now       ?  ? ?  ?  ? ?  ? ? ?EDUCATION NEEDS:  ? ?Not appropriate for education at this time ? ?Skin:  Skin Assessment: Skin Integrity Issues: ?Skin Integrity Issues:: Stage II, Other (Comment) ?Stage II: L upper sacrum ?Other: MASD to bilateral buttocks ? ?Last BM:  PTA/unknown ? ?Height:  ? ?Ht Readings from Last 1 Encounters:  ?12/30/21  '5\' 11"'  (1.803 m)  ? ? ?Weight:  ? ?Wt Readings from Last 1 Encounters:  ?12/31/21 102 kg  ? ? ?BMI:  Body mass index is 31.36 kg/m?. ? ?Estimated Nutritional Needs:  ?Kcal:  2040-2250 kcal ?Protein:  105-120 grams ?Fluid:  >/= 2.2 L/day ? ? ? ? ?Jarome Matin, MS, RD, LDN ?Registered Dietitian II ?Inpatient Clinical Nutrition ?RD pager # and on-call/weekend pager # available in [place]  ? ?

## 2021-12-31 NOTE — Consult Note (Signed)
[facility] CM Inpatient Consult ? ? ?12/31/2021 ? ?Tommy Yang ?[birth year] ?[id number] ? ?[facility] CM) ?  ?Patient was reviewed for less than 30 days readmission and high risk score for unplanned readmission. Patient is active with chronic care coordination team at primary provider office.  ? ?Plan: Will continue to follow for progression and disposition plans. ? ?Of note, [facility] Care Management services does not replace or interfere with any services that are arranged by inpatient case management or social work.  ? ?[name], MSN, RN ?[facility] Liaison ?Phone [phone] ?Toll free office [phone] ?

## 2021-12-31 NOTE — Consult Note (Signed)
WOC Nurse Consult Note: ?Patient receiving care in [facility] ICU 1230. Primary RN present for wound assessment. ?Reason for Consult: "sacral decub" ?Wound type: Moisture Associated Skin Damage, now referred to as  Irritant Dermatitis ?[id number] - Due to fecal, urinary or dual incontinence ?Pressure Injury POA: NA ?Measurement: na ?Wound bed: pink, ragged irregular borders ?Drainage (amount, consistency, odor) serous ?Periwound: intact ?Dressing procedure/placement/frequency: ?Use EITHER a foam dressing over the sacral Moisture Associated Skin Damage, OR barrier cream ([employer] Clear in purple and white tube in clean utility), but NOT both. ? ?Patient was incontinent of a small amount of brown stool at time of my assessment. ? ?Monitor the wound area(s) for worsening of condition such as: ?Signs/symptoms of infection,  ?Increase in size,  ?Development of or worsening of odor, ?Development of pain, or increased pain at the affected locations.  Notify the medical team if any of these develop. ? ?Thank you for the consult.  Discussed plan of care with the patient and bedside nurse.  [facility] nurse will not follow at this time.  Please re-consult the [facility] team if needed. ? ?Val Riles, RN, MSN, CWOCN, CNS-BC, pager [phone]  ?

## 2021-12-31 NOTE — Consult Note (Signed)
?Cardiology Consultation:  ? ?Patient ID: Tommy Yang ?MRN: [id number]; DOB: Dec 08, 1950 ? ?Admit date: 12/30/2021 ?Date of Consult: 12/31/2021 ? ?PCP:  [name], MD ?  ?[facility] Providers ?Cardiologist:  Pixie Casino, MD      ? ? ?Patient Profile:  ? ?Tommy Yang is a 71 y.o. male with a hx of persistent atrial fib,rate controlled atrial fib, prostate cancer with bone mets, recent subdural hematoma, HLD, DM2, anemia,urinary retention admitted 12/30/21 with weakness, decreased appetite, DOE who is being seen 12/31/2021 for the evaluation of atrial fib with RVR  at the request of Dr. Doristine Bosworth. ? ?History of Present Illness:  ? ?Tommy Yang with above hx admitted yesterday with weakness, decreased appetite, AKI,  recent SDU after fall with stopping anticoagulation for his persistent permanent atrial fib.  (Other hx of Tourette's syndrome, HTN, hx DVT in 2015) He has refused AAD therapy in the past.  Has been maintained on amiodarone 200 mg daily.   With this admit and last admit HR was elevated.  No chest pain. Pt placed on IV amiodarone.  ? ?Per Neuro on last admit concerning anticoagulation " I think the safest plan would be to keep him off therapeutic anticoagulation for 10 days, repeat a CT head, then discuss restarting it."  Per this repeat the CT scan of brain on the 4th then depending could resume.  Other issues plts 46 and chronic anemia.  ? ?Na 136, K+ 4.2 BUN 37, Cr 1.17 alk phos.  ?Hgb 7.9 plts 46 ?INR 1.5  ?Cortisol AM 28.2 ?Hs troponin 7,5 ?+hematuria has in dwelling foley cath.  ? ?PCXR: ?IMPRESSION: ?Increased patchy airspace opacities at both lung bases with probable ?small pleural effusions. Findings may be secondary to infection or ?atelectasis. ? ?Renal u/s ?IMPRESSION: ?1. Moderate bilateral hydronephrosis. ?2. Left pleural effusion. ?3. Cholelithiasis. ? ?Echo yesterday with EF 55-60% no RWMA, RV normal.  LA moderately dilated, RA mil to mod dilatation, a small pericardial  effusion present.  Trivial MR,   ? ? ?EKG:  The EKG was personally reviewed and demonstrates:  atrial fib with RVR at 139 no acute ST changes.  ?Telemetry:  Telemetry was personally reviewed and demonstrates:  atrial fib ? ?Currently  HR 159 BP 80/53 to 109/83  pt frustrated.  At home cannot hardly get around asks for rehab at discharge.  Wife is in agreement.  ? ? ?Past Medical History:  ?Diagnosis Date  ? Arthritis   ? Atrial fibrillation ([place])   ? Complication of anesthesia   ? needs to sit up  at 45 degree angle or gets afib during surgery or in recovery  ? Diabetes mellitus without complication ([place])   ? type 2  ? DVT (deep venous thrombosis) ([place]) 2015  ? left leg.behind knee  ? Foley catheter in place last changed 2eeks ago  ? last 7 months  ? GERD (gastroesophageal reflux disease)   ? Hx of radiation therapy 40 tx 2015  ? Hypercholesterolemia   ? Hypertension   ? Malignant neoplasm of prostate metastatic to bone ([place]) 04/30/2021  ? Prostate cancer ([place]) 12/06/13  ? gleason 4+3=7, 6/12 cores positive. seed implant and radiation  ? Tourette's syndrome   ? Urinary retention   ? Wears glasses   ? ? ?Past Surgical History:  ?Procedure Laterality Date  ? APPENDECTOMY  1968  ? BALLOON DILATION N/A 08/02/2015  ? Procedure: BALLOON DILATION;  Surgeon: [name], MD;  Location: [facility];  Service: Endoscopy;  Laterality: N/A;  ? CARDIOVERSION N/A 05/13/2018  ? Procedure: CARDIOVERSION;  Surgeon: Sanda Klein, MD;  Location: [facility];  Service: Cardiovascular;  Laterality: N/A;  ? CARDIOVERSION N/A 05/18/2018  ? Procedure: CARDIOVERSION;  Surgeon: Buford Dresser, MD;  Location: [facility] ENDOSCOPY;  Service: Cardiovascular;  Laterality: N/A;  ? COLONOSCOPY WITH PROPOFOL N/A 08/02/2015  ? Procedure: COLONOSCOPY WITH PROPOFOL w/ APC;  Surgeon: [name], MD;  Location: [facility];  Service: Endoscopy;  Laterality: N/A;  ? CYSTOSCOPY N/A 10/05/2020  ? Procedure: CYSTOSCOPY;  Surgeon: Alexis Frock, MD;   Location: [facility];  Service: Urology;  Laterality: N/A;  ? ESOPHAGOGASTRODUODENOSCOPY (EGD) WITH PROPOFOL N/A 08/02/2015  ? Procedure: ESOPHAGOGASTRODUODENOSCOPY (EGD) WITH PROPOFOL/ possible dilation.;  Surgeon: [name], MD;  Location: [facility] ENDOSCOPY;  Service: Endoscopy;  Laterality: N/A;  ? KNEE SURGERY  2001  ? right knee orthoscopic  ? PROSTATE BIOPSY  12/06/13  ? Gleason 4+3=7, volume 35 gm  ? [place]  ? Tourette'ssyndrome spinal fluid removed   ? TONSILLECTOMY  as child  ? TRANSURETHRAL RESECTION OF PROSTATE N/A 09/30/2019  ? Procedure: TRANSURETHRAL RESECTION OF THE PROSTATE (TURP);  Surgeon: Alexis Frock, MD;  Location[facility];  Service: Urology;  Laterality: N/A;  1 HR  ? TRANSURETHRAL RESECTION OF PROSTATE N/A 10/05/2020  ? Procedure: TRANSURETHRAL RESECTION OF THE PROSTATE (TURP);  Surgeon: Alexis Frock, MD;  Location: [facility];  Service: Urology;  Laterality: N/A;  1 HR  ?  ? ?Home Medications:  ?Prior to Admission medications   ?Medication Sig Start Date End Date Taking? Authorizing Provider  ?Acetaminophen (TYLENOL PO) Take 2 tablets by mouth every 6 (six) hours as needed (pain/headache).   Yes [provider]  ?amiodarone (PACERONE) 200 MG tablet Take 1 tablet (200 mg total) by mouth daily. 11/21/21  Yes Sherran Needs, NP  ?apalutamide (ERLEADA) 60 MG tablet Take 240 mg by mouth daily at 4 PM. May be taken with or without food. Swallow tablets whole.   Yes [provider]  ?atorvastatin (LIPITOR) 80 MG tablet TAKE 1 TABLET BY MOUTH EVERY DAY ?Patient taking differently: Take 80 mg by mouth every morning. 11/13/21  Yes Elayne Snare, MD  ?Calcium Carbonate (CALCIUM 600 PO) Take 600 mg by mouth at bedtime.   Yes [provider]  ?cephALEXin (KEFLEX) 250 MG capsule Take 1 capsule (250 mg total) by mouth every 6 (six) hours for 5 days. 12/26/21 12/31/21 Yes [place] Donnamarie Poag, DO  ?Cholecalciferol  (VITAMIN D3) 50 MCG (2000 UT) TABS Take 4,000 Units by mouth at bedtime.   Yes [provider]  ?Cyanocobalamin (VITAMIN B 12 PO) Take 125 mcg by mouth at bedtime.   Yes [provider]  ?diphenoxylate-atropine (LOMOTIL) 2.5-0.025 MG tablet TAKE 1 TABLET BY MOUTH 4 (FOUR) TIMES DAILY AS NEEDED FOR DIARRHEA OR LOOSE STOOLS. 08/02/21  Yes [name], MD  ?Empagliflozin-metFORMIN HCl ER (SYNJARDY XR) 12.12-998 MG TB24 TAKE 2 TABLETS BY MOUTH EVERY DAY ?Patient taking differently: Take 1 tablet by mouth 2 (two) times daily with a meal. 12/02/21  Yes Mercy Riding, MD  ?folic acid (FOLVITE) 1 MG tablet Take 1 tablet (1 mg total) by mouth daily. 11/29/21  Yes Mercy Riding, MD  ?furosemide (LASIX) 20 MG tablet Take 20 mg by mouth every morning.   Yes [provider]  ?metoprolol tartrate (LOPRESSOR) 100 MG tablet Take 0.5 tablets (50 mg total) by mouth 2 (  two) times daily for 1 day, THEN 1 tablet (100 mg total) 2 (two) times daily for 1 day. ?Patient taking differently: Take 183m by mouth twice daily 11/28/21 12/30/21 Yes Gonfa, TCharlesetta Ivory MD  ?pantoprazole (PROTONIX) 40 MG tablet TAKE 1 TABLET BY MOUTH EVERY DAY ?Patient taking differently: 40 mg every morning. 11/18/21  Yes JBiagio Borg MD  ?Semaglutide,0.25 or 0.5MG/DOS, (OZEMPIC, 0.25 OR 0.5 MG/DOSE,) 2 MG/1.5ML SOPN Inject 0.5 mg into the skin once a week. ?Patient taking differently: Inject 0.5 mg into the skin every Friday. 07/10/21  Yes KElayne Snare MD  ?tamsulosin (FLOMAX) 0.4 MG CAPS capsule Take 1 capsule (0.4 mg total) by mouth 2 (two) times daily. 09/24/21  Yes CSherran Needs NP  ?blood glucose meter kit and supplies KIT Test blood sugar daily as directed. Dx code: E11.9 01/23/15   DDarlyne Russian MD  ?glucose blood (ACCU-CHEK GUIDE) test strip Use as instructed to check blood sugar twice daily. 10/29/18   KElayne Snare MD  ?warfarin (COUMADIN) 5 MG tablet Take as directed by anticoagulation clinic ?Patient not taking: Reported on 12/30/2021  01/03/22   [place] Eric J, DO  ? ? ?Inpatient Medications: ?Scheduled Meds: ? atorvastatin  80 mg Oral q morning  ? Chlorhexidine Gluconate Cloth  6 each Topical Daily  ? feeding supplement  237 mL Oral BID BM

## 2021-12-31 NOTE — Progress Notes (Signed)
?PROGRESS NOTE ? ? ? ?Tommy Yang  HQI:[id number] DOB: [birth year] DOA: 12/30/2021 ?PCP: [name], MD ? ? ?Brief Narrative:  ?HPI: Tommy Yang is a 71 y.o. male with medical history significant of a fib, recent SDH, HLD, DM2. Prostate CA w/ bone mets, urinary retention. Presenting with generalized weakness. He was recently discharged from the hospital for a visit involving a fall and SDH. At the time he was found to be supratherapeutic on coumadin and it was held at discharge. Since being home, his wife notes that he has become progressively more weak. He becomes short of breath with movement. He has not had an appetite, and thus, his PO intake has been poor. She has been having difficulty caring for him at home as he has become closer and closer to total assist needs. He is now too weak to sit up. He has not had any fever, N/V/D, chest pain. When his symptoms did not improve this morning, she decided to bring him to the ED for evaluation.  ?  ?Of note, he reports chronic hematuria. He says he's had multiple antibiotics for UTI, but the bleeding never improves. He otherwise denies any aggravating or alleviating factors.  ?  ? ?Assessment & Plan: ?  ?Active Problems: ?  DM2 (diabetes mellitus, type 2) ([place]) ?  Prostate cancer ([place]) ?  Hyperlipidemia ?  Essential hypertension ?  Paroxysmal atrial fibrillation with RVR ([place]) ?  AKI (acute kidney injury) ([place]) ?  Urinary retention ?  Debility ?  Subdural hematoma ([facility]) ?  Pneumonia ?  SIRS (systemic inflammatory response syndrome) ([facility]) ?  Normocytic anemia ?  Thrombocytopenia ([place]) ?  Hematuria ?  Sacral decubitus ulcer ?  Atrial fibrillation with RVR ([place]) ? ?Permanent atrial fibrillation with RVR: Still with high rates around 140.  Per cardiology note, he has pacemaker and his baseline rate remained around 110.  Despite of high-grade, he is totally asymptomatic.  Wife at the bedside.  Cardiology managing this.  He is on a Medrol drip.  His oxygen has  been added.  Appreciate cardiology help.  He is not a candidate for any anticoagulation due to thrombocytopenia and recent subdural hematoma. ?  ??BLL PNA/atelectasis: Chest x-ray with questionable bilateral pneumonia versus atelectasis.  Totally asymptomatic for pneumonia, no leukocytosis, afebrile and procalcitonin borderline at 0.25, most parameters argue against pneumonia. I will discontinue cefepime and vancomycin and monitor. ?  ?Irritant dermatitis: Wound care was consulted for wound on the sacral area but wound care nurse, it is not decubitus ulcer. ?  ?Persistent hematuria/ Urinary retention: reports he's had hematuria for weeks despite abx for UTI.  Admitting hospitalist spoke w/ urology (Dr. Roni Bread); nothing to do right now per urology.  Per my discussion with RN, patient has coud? catheter which needs to be exchanged.  They have reached out to urology. ?  ?Debility/generalized weakness: PT OT consulted.  I suspect he will require discharge to SNF when medically stable. ? ?Prostate cancer w/ mets to bone ?    - continue follow up with onco ?  ?AKI: Resolved.  Avoid nephrotoxic agents. ? ?Anemia of chronic disease/chronic thrombocytopenia: Stable. ?  ?Recent SDH ?    - occurred after fall ?    - per neurosurgery during last visit; anticoagulation to be held until 5.4.23 however per cardiology note today, patient will likely need candidate for anticoagulation due to subdural hematoma and thrombocytopenia. ? ?HTN: Currently hypotensive.  Does not seem to be on any medications  for hypertension.  He has been started on midodrine by cardiology. ?  ?HLD ?    - continue statin ?  ?DM2: Hemoglobin A1c pending.  Blood sugar slightly elevated but will continue SSI. ? ?DVT prophylaxis: SCDs Start: 12/30/21 1955 ?SCDs Start: 12/30/21 1955 ?  Code Status: Full Code  ?Family Communication: Wife present at bedside.  Plan of care discussed with patient in length and he/she verbalized understanding and agreed with  it. ? ?Status is: Inpatient ?Remains inpatient appropriate because: Patient sick in the stepdown. ? ? ?Estimated body mass index is 31.36 kg/m? as calculated from the following: ?  Height as of this encounter: '5\' 11"'$  (1.803 m). ?  Weight as of this encounter: 102 kg. ? ?Pressure Injury 12/30/21 Sacrum Left;Upper;Bilateral Stage 2 -  Partial thickness loss of dermis presenting as a shallow open injury with a red, pink wound bed without slough. bilateral small sacral wounds (Active)  ?12/30/21 2057  ?Location: Sacrum  ?Location Orientation: Left;Upper;Bilateral  ?Staging: Stage 2 -  Partial thickness loss of dermis presenting as a shallow open injury with a red, pink wound bed without slough.  ?Wound Description (Comments): bilateral small sacral wounds  ?Present on Admission: Yes  ?Dressing Type Foam - Lift dressing to assess site every shift 12/31/21 0800  ? ?Nutritional Assessment: ?Body mass index is 31.36 kg/m?Marland KitchenMarland Kitchen ?Seen by dietician.  I agree with the assessment and plan as outlined below: ?Nutrition Status: ?  ?  ?  ? ?. ?Skin Assessment: ?I have examined the patient's skin and I agree with the wound assessment as performed by the wound care RN as outlined below: ?Pressure Injury 12/30/21 Sacrum Left;Upper;Bilateral Stage 2 -  Partial thickness loss of dermis presenting as a shallow open injury with a red, pink wound bed without slough. bilateral small sacral wounds (Active)  ?12/30/21 2057  ?Location: Sacrum  ?Location Orientation: Left;Upper;Bilateral  ?Staging: Stage 2 -  Partial thickness loss of dermis presenting as a shallow open injury with a red, pink wound bed without slough.  ?Wound Description (Comments): bilateral small sacral wounds  ?Present on Admission: Yes  ?Dressing Type Foam - Lift dressing to assess site every shift 12/31/21 0800  ? ? ?Consultants:  ?Cardiology ? ?Procedures:  ?None ? ?Antimicrobials:  ?Anti-infectives (From admission, onward)  ? ? Start     Dose/Rate Route Frequency Ordered  Stop  ? 12/30/21 1800  ceFEPIme (MAXIPIME) 2 g in sodium chloride 0.9 % 100 mL IVPB       ? 2 g ?200 mL/hr over 30 Minutes Intravenous Every 8 hours 12/30/21 1336    ? 12/30/21 1030  vancomycin (VANCOREADY) IVPB 2000 mg/400 mL       ? 2,000 mg ?200 mL/hr over 120 Minutes Intravenous  Once 12/30/21 1028 12/30/21 1600  ? 12/30/21 1030  ceFEPIme (MAXIPIME) 2 g in sodium chloride 0.9 % 100 mL IVPB       ? 2 g ?200 mL/hr over 30 Minutes Intravenous  Once 12/30/21 1028 12/30/21 1117  ? 12/30/21 1015  metroNIDAZOLE (FLAGYL) IVPB 500 mg       ? 500 mg ?100 mL/hr over 60 Minutes Intravenous Every 12 hours 12/30/21 1014    ? ?  ?  ? ? ?Subjective: ?Patient seen and examined.  Wife at the bedside.  Patient has no complaints other than just slight weakness.  He denied any chest pain, palpitation or shortness of breath. ? ?Objective: ?Vitals:  ? 12/31/21 1000 12/31/21 1100 12/31/21 1130 12/31/21 1200  ?  BP: 122/76 99/66  105/67  ?Pulse: (!) 140 (!) 131  (!) 146  ?Resp: 10 (!) 25  (!) 22  ?Temp:   97.6 ?F (36.4 ?C)   ?TempSrc:   Oral   ?SpO2: 99% 97%  100%  ?Weight:      ?Height:      ? ? ?Intake/Output Summary (Last 24 hours) at 12/31/2021 1402 ?Last data filed at 12/31/2021 1200 ?Gross per 24 hour  ?Intake 1405.01 ml  ?Output 1175 ml  ?Net 230.01 ml  ? ?Filed Weights  ? 12/30/21 0939 12/30/21 2005 12/31/21 0500  ?Weight: 103.9 kg 102 kg 102 kg  ? ? ?Examination: ? ?General exam: Appears calm and comfortable  ?Respiratory system: Clear to auscultation. Respiratory effort normal. ?Cardiovascular system: S1 & S2 heard, irregularly irregular rate and rhythm. No JVD, murmurs, rubs, gallops or clicks. No pedal edema. ?Gastrointestinal system: Abdomen is nondistended, soft and nontender. No organomegaly or masses felt. Normal bowel sounds heard. ?Central nervous system: Alert and oriented. No focal neurological deficits. ?Extremities: Symmetric 5 x 5 power. ?Skin: No rashes, lesions or ulcers ?Psychiatry: Judgement and insight appear  normal. Mood & affect appropriate.  ? ? ?Data Reviewed: I have personally reviewed following labs and imaging studies ? ?CBC: ?Recent Labs  ?Lab 12/25/21 ?1505 12/26/21 ?0701 12/30/21 ?1021 12/31/21 ?0404  ?WBC 5.6 6.

## 2022-01-01 ENCOUNTER — Telehealth: Payer: BC Managed Care – PPO

## 2022-01-01 DIAGNOSIS — Z66 Do not resuscitate: Secondary | ICD-10-CM

## 2022-01-01 DIAGNOSIS — E1100 Type 2 diabetes mellitus with hyperosmolarity without nonketotic hyperglycemic-hyperosmolar coma (NKHHC): Secondary | ICD-10-CM

## 2022-01-01 DIAGNOSIS — Z7189 Other specified counseling: Secondary | ICD-10-CM

## 2022-01-01 DIAGNOSIS — R310 Gross hematuria: Secondary | ICD-10-CM | POA: Diagnosis not present

## 2022-01-01 DIAGNOSIS — N179 Acute kidney failure, unspecified: Secondary | ICD-10-CM | POA: Diagnosis not present

## 2022-01-01 DIAGNOSIS — C61 Malignant neoplasm of prostate: Secondary | ICD-10-CM | POA: Diagnosis not present

## 2022-01-01 DIAGNOSIS — R531 Weakness: Secondary | ICD-10-CM

## 2022-01-01 DIAGNOSIS — R5381 Other malaise: Secondary | ICD-10-CM

## 2022-01-01 DIAGNOSIS — Z515 Encounter for palliative care: Secondary | ICD-10-CM

## 2022-01-01 DIAGNOSIS — I4891 Unspecified atrial fibrillation: Secondary | ICD-10-CM | POA: Diagnosis not present

## 2022-01-01 LAB — OCCULT BLOOD X 1 CARD TO LAB, STOOL: Fecal Occult Bld: NEGATIVE

## 2022-01-01 LAB — BASIC METABOLIC PANEL
Anion gap: 8 (ref 5–15)
BUN: 36 mg/dL — ABNORMAL HIGH (ref 8–23)
CO2: 17 mmol/L — ABNORMAL LOW (ref 22–32)
Calcium: 6.6 mg/dL — ABNORMAL LOW (ref 8.9–10.3)
Chloride: 112 mmol/L — ABNORMAL HIGH (ref 98–111)
Creatinine, Ser: 1.26 mg/dL — ABNORMAL HIGH (ref 0.61–1.24)
GFR, Estimated: >60 mL/min (ref >60)
Glucose, Bld: 157 mg/dL — ABNORMAL HIGH (ref 70–99)
Potassium: 4.8 mmol/L (ref 3.5–5.1)
Sodium: 137 mmol/L (ref 135–145)

## 2022-01-01 LAB — GLUCOSE, CAPILLARY
Glucose-Capillary: 199 mg/dL — ABNORMAL HIGH (ref 70–99)
Glucose-Capillary: 197 mg/dL — ABNORMAL HIGH (ref 70–99)
Glucose-Capillary: 157 mg/dL — ABNORMAL HIGH (ref 70–99)
Glucose-Capillary: 133 mg/dL — ABNORMAL HIGH (ref 70–99)

## 2022-01-01 LAB — HEMOGLOBIN A1C
Hgb A1c MFr Bld: 6 % — ABNORMAL HIGH (ref 4.8–5.6)
Mean Plasma Glucose: 126 mg/dL

## 2022-01-01 LAB — CBC WITH DIFFERENTIAL/PLATELET
Abs Immature Granulocytes: 0.27 10*3/uL — ABNORMAL HIGH (ref 0.00–0.07)
Basophils Absolute: 0 10*3/uL (ref 0.0–0.1)
Basophils Relative: 0 %
Eosinophils Absolute: 0 10*3/uL (ref 0.0–0.5)
Eosinophils Relative: 0 %
HCT: 25.4 % — ABNORMAL LOW (ref 39.0–52.0)
Hemoglobin: 7.6 g/dL — ABNORMAL LOW (ref 13.0–17.0)
Immature Granulocytes: 5 %
Lymphocytes Relative: 9 %
Lymphs Abs: 0.5 10*3/uL — ABNORMAL LOW (ref 0.7–4.0)
MCH: 30 pg (ref 26.0–34.0)
MCHC: 29.9 g/dL — ABNORMAL LOW (ref 30.0–36.0)
MCV: 100.4 fL — ABNORMAL HIGH (ref 80.0–100.0)
Monocytes Absolute: 0.3 10*3/uL (ref 0.1–1.0)
Monocytes Relative: 6 %
Neutro Abs: 4.1 10*3/uL (ref 1.7–7.7)
Neutrophils Relative %: 80 %
Platelets: 45 10*3/uL — ABNORMAL LOW (ref 150–400)
RBC: 2.53 MIL/uL — ABNORMAL LOW (ref 4.22–5.81)
RDW: 22 % — ABNORMAL HIGH (ref 11.5–15.5)
WBC: 5.2 10*3/uL (ref 4.0–10.5)
nRBC: 1.5 % — ABNORMAL HIGH (ref 0.0–0.2)

## 2022-01-01 MED ORDER — SALINE SPRAY 0.65 % NA SOLN
1.0000 | NASAL | Status: DC | PRN
  Filled 2022-01-01: qty 44

## 2022-01-01 MED ORDER — METOPROLOL TARTRATE 12.5 MG HALF TABLET
12.5000 mg | ORAL_TABLET | Freq: Two times a day (BID) | ORAL | Status: DC
  Administered 2022-01-01 – 2022-01-03 (×5): 12.5 mg via ORAL
  Filled 2022-01-01 (×5): qty 1

## 2022-01-01 MED ORDER — MIDODRINE HCL 5 MG PO TABS
5.0000 mg | ORAL_TABLET | Freq: Three times a day (TID) | ORAL | Status: DC
Start: 2022-01-01 — End: 2022-01-03
  Administered 2022-01-01 – 2022-01-03 (×7): 5 mg via ORAL
  Filled 2022-01-01 (×8): qty 1

## 2022-01-01 MED ORDER — LOPERAMIDE HCL 2 MG PO CAPS
2.0000 mg | ORAL_CAPSULE | Freq: Three times a day (TID) | ORAL | Status: DC | PRN
  Administered 2022-01-01: 2 mg via ORAL
  Filled 2022-01-01: qty 1

## 2022-01-01 MED ORDER — LORAZEPAM 1 MG PO TABS
1.0000 mg | ORAL_TABLET | Freq: Three times a day (TID) | ORAL | Status: DC | PRN
Start: 2022-01-01 — End: 2022-01-03
  Administered 2022-01-02: 1 mg via ORAL
  Filled 2022-01-01 (×2): qty 1

## 2022-01-01 MED ORDER — LACTATED RINGERS IV SOLN: INTRAVENOUS | Status: AC

## 2022-01-01 NOTE — Progress Notes (Signed)
?Cardiology Progress Note  ?Patient ID: [name] ?MRN: [id number] ?DOB: [birth year] ?Date of Encounter: 01/01/2022 ? ?Primary Cardiologist: Pixie Casino, MD ? ?Subjective  ? ?Chief Complaint: Fatigue ? ?HPI: HR not better. BP still low.  ? ?ROS:  ?All other ROS reviewed and negative. Pertinent positives noted in the HPI.    ? ?Inpatient Medications  ?Scheduled Meds: ? atorvastatin  80 mg Oral q morning  ? Chlorhexidine Gluconate Cloth  6 each Topical Daily  ? digoxin  0.125 mg Oral Daily  ? folic acid  1 mg Oral Daily  ? insulin aspart  0-15 Units Subcutaneous TID WC  ? insulin aspart  0-5 Units Subcutaneous QHS  ? lactose free nutrition  237 mL Oral BID BM  ? metoprolol tartrate  12.5 mg Oral BID  ? midodrine  5 mg Oral TID WC  ? multivitamin with minerals  1 tablet Oral Daily  ? pantoprazole  40 mg Oral q morning  ? sodium bicarbonate  650 mg Oral BID  ? ?Continuous Infusions: ? ?PRN Meds: ?acetaminophen **OR** acetaminophen, loperamide, ondansetron **OR** ondansetron (ZOFRAN) IV  ? ?Vital Signs  ? ?Vitals:  ? 01/01/22 0900 01/01/22 0938 01/01/22 1000 01/01/22 1100  ?BP:      ?Pulse: (!) 156 (!) 153 (!) 150 (!) 149  ?Resp: (!) 27  (!) 23 (!) 29  ?Temp:      ?TempSrc:      ?SpO2: 97%  96% 96%  ?Weight:      ?Height:      ? ? ?Intake/Output Summary (Last 24 hours) at 01/01/2022 1155 ?Last data filed at 01/01/2022 7902 ?Gross per 24 hour  ?Intake 324.38 ml  ?Output 950 ml  ?Net -625.62 ml  ? ? ?  12/31/2021  ?  5:00 AM 12/30/2021  ?  8:05 PM 12/30/2021  ?  9:39 AM  ?Last 3 Weights  ?Weight (lbs) 224 lb 13.9 oz 224 lb 13.9 oz 229 lb  ?Weight (kg) 102 kg 102 kg 103.874 kg  ?   ? ?Telemetry  ?Overnight telemetry shows Afib 140s, which I personally reviewed.  ? ?Physical Exam  ? ?Vitals:  ? 01/01/22 0900 01/01/22 0938 01/01/22 1000 01/01/22 1100  ?BP:      ?Pulse: (!) 156 (!) 153 (!) 150 (!) 149  ?Resp: (!) 27  (!) 23 (!) 29  ?Temp:      ?TempSrc:      ?SpO2: 97%  96% 96%  ?Weight:      ?Height:      ?  ?Intake/Output  Summary (Last 24 hours) at 01/01/2022 1155 ?Last data filed at 01/01/2022 4097 ?Gross per 24 hour  ?Intake 324.38 ml  ?Output 950 ml  ?Net -625.62 ml  ?  ? ?  12/31/2021  ?  5:00 AM 12/30/2021  ?  8:05 PM 12/30/2021  ?  9:39 AM  ?Last 3 Weights  ?Weight (lbs) 224 lb 13.9 oz 224 lb 13.9 oz 229 lb  ?Weight (kg) 102 kg 102 kg 103.874 kg  ?  Body mass index is 31.36 kg/m?.  ? ?General: Well nourished, well developed, in no acute distress ?Head: Atraumatic, normal size  ?Eyes: PEERLA, EOMI  ?Neck: Supple, no JVD ?Endocrine: No thryomegaly ?Cardiac: Normal S1, S2; irregular rhythm, no murmurs rubs or gallops ?Lungs: Clear to auscultation bilaterally, no wheezing, rhonchi or rales  ?Abd: Soft, nontender, no hepatomegaly  ?Ext: No edema, pulses 2+ ?Musculoskeletal: No deformities, BUE and BLE strength normal and equal ?Skin: Warm and dry,  no rashes   ?Neuro: Alert and oriented to person, place, time, and situation, CNII-XII grossly intact, no focal deficits  ?Psych: Normal mood and affect  ? ?Labs  ?High Sensitivity Troponin:   ?Recent Labs  ?Lab 12/30/21 ?1021 12/30/21 ?1213  ?TROPONINIHS 7 5  ?   ?Cardiac EnzymesNo results for input(s): TROPONINI in the last 168 hours. No results for input(s): TROPIPOC in the last 168 hours.  ?Chemistry ?Recent Labs  ?Lab 12/30/21 ?1021 12/31/21 ?0404 01/01/22 ?0239  ?NA 135 136 137  ?K 4.6 4.2 4.8  ?CL 109 110 112*  ?CO2 17* 16* 17*  ?GLUCOSE 235* 177* 157*  ?BUN 38* 37* 36*  ?CREATININE 1.33* 1.17 1.26*  ?CALCIUM 7.3* 6.7* 6.6*  ?PROT 6.3* 5.5*  --   ?ALBUMIN 1.7* <1.5*  --   ?AST 21 25  --   ?ALT 13 13  --   ?ALKPHOS 232* 198*  --   ?BILITOT 0.9 0.8  --   ?GFRNONAA 58* >60 >60  ?ANIONGAP '9 10 8  '$ ?  ?Hematology ?Recent Labs  ?Lab 12/31/21 ?0404 12/31/21 ?1657 01/01/22 ?0530  ?WBC 5.4 5.2 5.2  ?RBC 2.61* 2.69* 2.53*  ?HGB 7.9* 8.2* 7.6*  ?HCT 25.6* 26.7* 25.4*  ?MCV 98.1 99.3 100.4*  ?MCH 30.3 30.5 30.0  ?MCHC 30.9 30.7 29.9*  ?RDW 21.4* 21.6* 22.0*  ?PLT 46* 46* 45*  ? ?BNPNo results for  input(s): BNP, PROBNP in the last 168 hours.  ?DDimer No results for input(s): DDIMER in the last 168 hours.  ? ?Radiology  ?US RENAL ? ?Result Date: 12/30/2021 ?CLINICAL DATA:  Acute kidney injury EXAM: RENAL / URINARY TRACT ULTRASOUND COMPLETE COMPARISON:  CT November 25, 2021. FINDINGS: Right Kidney: Renal measurements: 12.8 x 6.7 x 6.5 cm = volume: 293 mL. Echogenicity within normal limits. Moderate hydronephrosis. Left Kidney: Renal measurements: 12.0 x 8.9 x 6.5 cm = volume: 284 mL. Echogenicity within normal limits. Moderate hydronephrosis. 5.6 cm exophytic renal cyst. Bladder: Appears normal for degree of bladder distention. Other: Left pleural effusion.  Cholelithiasis. IMPRESSION: 1. Moderate bilateral hydronephrosis. 2. Left pleural effusion. 3. Cholelithiasis. Electronically Signed   By: Dahlia Bailiff M.D.   On: 12/30/2021 13:55  ? ?ECHOCARDIOGRAM COMPLETE ? ?Result Date: 12/30/2021 ?   ECHOCARDIOGRAM REPORT   Patient Name:   JAYKUB MACKINS Date of Exam: 12/30/2021 Medical Rec #:  [id number]          Height:       71.0 in Accession #:    [id number]         Weight:       229.0 lb Date of Birth:  Jun 08, 1951          BSA:          2.233 m? Patient Age:    71 years           BP:           98/68 mmHg Patient Gender: M                  HR:           103 bpm. Exam Location:  Inpatient Procedure: 2D Echo, Cardiac Doppler and Color Doppler Indications:    Afib  History:        Patient has prior history of Echocardiogram examinations.                 Arrythmias:Atrial Fibrillation; Risk Factors:Hypertension and  Diabetes.  Sonographer:    Jyl Heinz Referring Phys: [id number] [place]  1. Left ventricular ejection fraction, by estimation, is 55 to 60%. The left ventricle has normal function. The left ventricle has no regional wall motion abnormalities. Left ventricular diastolic function could not be evaluated.  2. Right ventricular systolic function is normal. The right ventricular size  is normal. There is normal pulmonary artery systolic pressure.  3. Left atrial size was moderately dilated.  4. Right atrial size was mild to moderately dilated.  5. A small pericardial effusion is present.  6. The mitral valve is grossly normal. Trivial mitral valve regurgitation. No evidence of mitral stenosis.  7. The aortic valve has an indeterminant number of cusps. There is moderate calcification of the aortic valve. Aortic valve regurgitation is not visualized. Aortic valve sclerosis is present, with no evidence of aortic valve stenosis.  8. The inferior vena cava is dilated in size with >50% respiratory variability, suggesting right atrial pressure of 8 mmHg. Comparison(s): Changes from prior study are noted. Small pericardial effusion now present. FINDINGS  Left Ventricle: Left ventricular ejection fraction, by estimation, is 55 to 60%. The left ventricle has normal function. The left ventricle has no regional wall motion abnormalities. The left ventricular internal cavity size was normal in size. There is  borderline left ventricular hypertrophy. Left ventricular diastolic function could not be evaluated due to atrial fibrillation. Left ventricular diastolic function could not be evaluated. Right Ventricle: The right ventricular size is normal. No increase in right ventricular wall thickness. Right ventricular systolic function is normal. There is normal pulmonary artery systolic pressure. The tricuspid regurgitant velocity is 2.01 m/s, and  with an assumed right atrial pressure of 8 mmHg, the estimated right ventricular systolic pressure is 47.8 mmHg. Left Atrium: Left atrial size was moderately dilated. Right Atrium: Right atrial size was mild to moderately dilated. Pericardium: A small pericardial effusion is present. There is excessive respiratory variation in the mitral valve spectral Doppler velocities and excessive respiratory variation in the tricuspid valve spectral Doppler velocities. Presence of  epicardial fat layer. Mitral Valve: The mitral valve is grossly normal. There is mild thickening of the mitral valve leaflet(s). Trivial mitral valve regurgitation. No evidence of mitral valve stenosis. Tricuspi

## 2022-01-01 NOTE — Plan of Care (Signed)
?  Problem: Pain Managment: ?Goal: General experience of comfort will improve ?Outcome: Progressing ?  ?Problem: Activity: ?Goal: Risk for activity intolerance will decrease ?Outcome: Not Progressing ?  ?Problem: Nutrition: ?Goal: Adequate nutrition will be maintained ?Outcome: Not Progressing ?  ?Problem: Elimination: ?Goal: Will not experience complications related to bowel motility ?Outcome: Not Progressing ?Goal: Will not experience complications related to urinary retention ?Outcome: Not Progressing ?  ?Problem: Skin Integrity: ?Goal: Risk for impaired skin integrity will decrease ?Outcome: Not Progressing ?  ?Problem: Health Behavior/Discharge Planning: ?Goal: Ability to manage health-related needs will improve ?Outcome: Not Progressing ?  ?Problem: Activity: ?Goal: Activity intolerance will improve ?Outcome: Not Progressing ?  ?

## 2022-01-01 NOTE — TOC Initial Note (Signed)
Transition of Care (TOC) - Initial/Assessment Note  ? ? ?Patient Details  ?Name: Tommy Yang ?MRN: [id number] ?Date of Birth: 1950-09-08 ? ?Transition of [facility]) CM/SW Contact:    ?Dessa Phi, RN ?Phone Number: ?01/01/2022, 9:38 AM ? ?Clinical Narrative: From home. Will await recc if PT cons needed. Monitor for d/c plans.                ? ? ?Expected Discharge Plan: [place] ?Barriers to Discharge: Continued Medical Work up ? ? ?Patient Goals and CMS Choice ?Patient states their goals for this hospitalization and ongoing recovery are:: Home ?CMS Medicare.gov Compare Post Acute Care list provided to:: Patient Represenative (must comment) ?Choice offered to / list presented to : Spouse ? ?Expected Discharge Plan and Services ?Expected Discharge Plan: [place] ?  ?Discharge Planning Services: CM Consult ?  ?Living arrangements for the past 2 months: [place] ?                ?  ?  ?  ?  ?  ?  ?  ?  ?  ?  ? ?Prior Living Arrangements/Services ?Living arrangements for the past 2 months: [place] ?Lives with:: Spouse ?Patient language and need for interpreter reviewed:: Yes ?Do you feel safe going back to the place where you live?: Yes      ?Need for Family Participation in Patient Care: Yes (Comment) ?  ?Current home services: DME (rw,w/c;HHC) ?Criminal Activity/Legal Involvement Pertinent to Current Situation/Hospitalization: No - Comment as needed ? ?Activities of Daily Living ?Home Assistive Devices/Equipment: Wheelchair ?ADL Screening (condition at time of admission) ?Patient's cognitive ability adequate to safely complete daily activities?: Yes ?Is the patient deaf or have difficulty hearing?: No ?Does the patient have difficulty seeing, even when wearing glasses/contacts?: No ?Does the patient have difficulty concentrating, remembering, or making decisions?: No ?Patient able to express need for assistance with ADLs?: Yes ?Does the patient have  difficulty dressing or bathing?: Yes ?Independently performs ADLs?: No ?Does the patient have difficulty walking or climbing stairs?: Yes ?Weakness of Legs: Both ?Weakness of Arms/Hands: Both ? ?Permission Sought/Granted ?  ?Permission granted to share information with : Yes, Verbal Permission Granted ? Share Information with NAME: Case Manager ?   ?   ?   ? ?Emotional Assessment ?Appearance:: Appears stated age ?Attitude/Demeanor/Rapport: Gracious ?Affect (typically observed): Accepting ?Orientation: : Oriented to Self, Oriented to Place, Oriented to  Time, Oriented to Situation ?Alcohol / Substance Use: Not Applicable ?Psych Involvement: No (comment) ? ?Admission diagnosis:  Generalized weakness [R53.1] ?AKI (acute kidney injury) ([place]) [N17.9] ?Atrial fibrillation with RVR ([place]) [I48.91] ?Hypotension, unspecified hypotension type [I95.9] ?Patient Active Problem List  ? Diagnosis Date Noted  ? Malnutrition of moderate degree 12/31/2021  ? Pneumonia 12/30/2021  ? SIRS (systemic inflammatory response syndrome) ([place]) 12/30/2021  ? Normocytic anemia 12/30/2021  ? Thrombocytopenia ([place]) 12/30/2021  ? Hematuria 12/30/2021  ? Sacral decubitus ulcer 12/30/2021  ? Atrial fibrillation with RVR ([place]) 12/30/2021  ? Hypoglycemia secondary to sulfonylurea 12/24/2021  ? Subdural hematoma ([place]) 12/23/2021  ? DM2 (diabetes mellitus, type 2) ([place]) 11/26/2021  ? Debility 11/26/2021  ? Urinary retention 09/30/2019  ? Obstructive uropathy 08/11/2019  ? AKI (acute kidney injury) ([place]) 08/11/2019  ? Low back pain 08/10/2019  ? Paroxysmal atrial fibrillation with RVR ([place]) 05/23/2019  ? Paroxysmal atrial fibrillation ([place]) 05/23/2019  ? Essential hypertension 09/24/2018  ? Persistent atrial fibrillation   ? Microalbuminuria  04/23/2016  ? Rectal bleeding 07/18/2015  ? Dysphagia 07/18/2015  ? Esophageal reflux 05/09/2014  ? Hyperlipidemia 02/28/2014  ? Prostate cancer ([place]) 12/14/2013  ? Erectile dysfunction 10/26/2013  ? History of DVT  (deep vein thrombosis) 09/28/2013  ? Bilateral lower extremity edema 09/28/2013  ? ?PCP:  [name], MD ?Pharmacy:   ?[id number] [place] [address]?[place] [address]?Phone: [phone]: [phone]? ? ? ? ?Social Determinants of Health (SDOH) Interventions ?  ? ?Readmission Risk Interventions ?   ? View : No data to display.  ?  ?  ?  ? ? ? ?

## 2022-01-01 NOTE — Progress Notes (Signed)
Patient has a chronic Foley catheter with retention.  He has metastatic prostate cancer and has had radiation.  He has chronic bleeding off and on.  Sometimes his catheter needs to be placed as a larger size ? ?He was on monthly catheter changes #16 [place] but on December 17, 2021 he came into the clinic and it was not draining well so a #20 [place] was inserted still in place ? ?Still draining well.  Sometimes leaks around the catheter.  Old blood in catheter tube ? ?I explained to the patient that changing the catheter more frequently will not help the situation and of course it could cause more bleeding and local trauma ? ?Continue to follow-up in clinic and will continue catheter changes.  It does not matter if the catheter stays in for 4 to 5 weeks first 4 weeks. ? ?Flush catheter as needed with normal saline or sterile water and I spoke to the nurses about this. ? ?Patient agreed with plan ?

## 2022-01-01 NOTE — Progress Notes (Signed)
?      ? ? ? [facility] ?                                                                           ? ? ?Tommy Yang, is a 71 y.o. male, DOB - 22-Jul-1951, VPX:[id number] ?Admit date - 12/30/2021    ?Outpatient Primary MD for the patient is [name], MD ? ?LOS - 2  days ? ?Chief Complaint  ?Patient presents with  ? Weakness  ?    ? ?Brief summary  ? ?Patient is a 71 year old male with A-fib, recent SDH, DM 2, hyperlipidemia, prostrate CA with bone mets, urinary retention presented with generalized weakness. He was recently discharged from the hospital for a visit involving a fall and SDH. At the time he was found to be supratherapeutic on coumadin and it was held at discharge. Since being home, his wife notes that he has become progressively more weak. He becomes short of breath with movement. He has not had an appetite, and thus, his PO intake has been poor. She has been having difficulty caring for him at home as he has become closer and closer to total assist needs. He is now too weak to sit up.  ? ?Patient has chronic hematuria, has had multiple antibiotics for UTI with no significant improvement in hematuria. ? ?Assessment & Plan  ? ?Permanent A-fib with RVR ?-Heart rate still not controlled, in 140s to 150s, RVR, baseline around 110, asymptomatic ?-Patient was placed on amiodarone drip, no significant effect. ?-Cardiology consulted, very limited options, borderline BP.  Not anticoagulation candidate due to chronic anemia, subdural hematoma and hematuria ?-Cardiology stopped amiodarone drip today, added digoxin, metoprolol ?-Palliative medicine consulted for goals of care.  I discussed with the patient regarding palliative medicine and he is open to the idea however requested full CODE STATUS ? ?Bilateral pneumonia versus atelectasis ?-Asymptomatic, no leukocytosis, fevers, borderline procalcitonin ?-Antibiotics were discontinued on 5/2, will continue to monitor ?  ?Persistent hematuria, urinary  retention with underlying history of metastatic prostate CA ?-Renal ultrasound showed moderate bilateral hydronephrosis, has chronic Foley catheter, chronic hematuria ?-Discussed with urology, Dr. McDiarmid, appreciate recommendations, recommended continue Foley, currently no need of change, outpatient follow-up ? ?Generalized debility, weakness ?-PT OT evaluation ? ?History of prostate CA with bony metastasis ?-Continue to follow with urology and oncology ?-Palliative medicine consulted for goals of care ?  ?Acute kidney injury ?-Creatinine 1.3 at the time of admission, baseline 0.9 ?-Improving 1.2, continue sodium bicarb tabs, gentle IV fluid hydration ?  ? ? Anemia of chronic disease/chronic thrombocytopenia:  ?-Monitor H&H, transfuse for hemoglobin less than 7 ? ?Recent subdural hematoma, with fall ?-Per neurosurgery, hold anticoagulation until 01/02/2022 however per cardiology note, not anticoagulation candidate due to SDH, hematuria, thrombocytopenia, anemia  ? ?Hypotension ?-Currently hypotensive, has chronic hypotension, work-up negative for any sepsis ?- continue midodrine ?-Palliative medicine consulted ?  ?  ?HLD ?-Continue statin ?  ?Diabetes mellitus type 2 NIDDM  ?-Continue sliding scale insulin while inpatient ?-Hemoglobin A1c 6.0 ?  ? ?Pressure Injury Documentation: ?Pressure Injury 12/30/21 Sacrum Left;Upper;Bilateral Stage 2 -  Partial thickness loss of dermis presenting as a shallow open injury with a red, pink wound  bed without slough. bilateral small sacral wounds (Active)  ?12/30/21 2057  ?Location: Sacrum  ?Location Orientation: Left;Upper;Bilateral  ?Staging: Stage 2 -  Partial thickness loss of dermis presenting as a shallow open injury with a red, pink wound bed without slough.  ?Wound Description (Comments): bilateral small sacral wounds  ?Present on Admission: Yes  ?Dressing Type Foam - Lift dressing to assess site every shift 01/01/22 1200  ? ? ?Moderate protein calorie  malnutrition ?-Continue nutritional supplements ? ? ? ?Code Status: Discussed in detail with the patient this morning, wants to continue with full CODE STATUS however open to palliative medicine consult ? ?DVT Prophylaxis:  SCDs Start: 12/30/21 1955 ?SCDs Start: 12/30/21 1955 ? ? ?Level of Care: Level of care: Stepdown ?Family Communication: Updated patient ? ?Disposition Plan:      Remains inpatient appropriate: Heart rate still not well controlled ? ? ?Procedures:  ?2D echo showed EF of 55 to 60%, no regional wall motion abnormality ? ?Consultants:   ?Cardiology ?Urology ?Palliative medicine ? ?Antimicrobials:  ? ?Anti-infectives (From admission, onward)  ? ? Start     Dose/Rate Route Frequency Ordered Stop  ? 12/30/21 1800  ceFEPIme (MAXIPIME) 2 g in sodium chloride 0.9 % 100 mL IVPB  Status:  Discontinued       ? 2 g ?200 mL/hr over 30 Minutes Intravenous Every 8 hours 12/30/21 1336 12/31/21 1406  ? 12/30/21 1030  vancomycin (VANCOREADY) IVPB 2000 mg/400 mL       ? 2,000 mg ?200 mL/hr over 120 Minutes Intravenous  Once 12/30/21 1028 12/30/21 1600  ? 12/30/21 1030  ceFEPIme (MAXIPIME) 2 g in sodium chloride 0.9 % 100 mL IVPB       ? 2 g ?200 mL/hr over 30 Minutes Intravenous  Once 12/30/21 1028 12/30/21 1117  ? 12/30/21 1015  metroNIDAZOLE (FLAGYL) IVPB 500 mg  Status:  Discontinued       ? 500 mg ?100 mL/hr over 60 Minutes Intravenous Every 12 hours 12/30/21 1014 01/01/22 0921  ? ?  ? ? ? ? ? ?Medications ? atorvastatin  80 mg Oral q morning  ? Chlorhexidine Gluconate Cloth  6 each Topical Daily  ? digoxin  0.125 mg Oral Daily  ? folic acid  1 mg Oral Daily  ? insulin aspart  0-15 Units Subcutaneous TID WC  ? insulin aspart  0-5 Units Subcutaneous QHS  ? lactose free nutrition  237 mL Oral BID BM  ? metoprolol tartrate  12.5 mg Oral BID  ? midodrine  5 mg Oral TID WC  ? multivitamin with minerals  1 tablet Oral Daily  ? pantoprazole  40 mg Oral q morning  ? sodium bicarbonate  650 mg Oral BID   ? ? ? ? ?Subjective:  ? ?[name] was seen and examined today.  Heart rate still not well controlled in 140s to 150s, RVR, asymptomatic, no chest pain.  Borderline BP.  Still having hematuria, wants the Foley catheter to be changed.  Afebrile.   ? ?Objective:  ? ?Vitals:  ? 01/01/22 1000 01/01/22 1100 01/01/22 1200 01/01/22 1225  ?BP:   108/71 106/74  ?Pulse: (!) 150 (!) 149 (!) 145 (!) 151  ?Resp: (!) 23 (!) 29 (!) 24   ?Temp:      ?TempSrc:      ?SpO2: 96% 96% 97%   ?Weight:      ?Height:      ? ? ?Intake/Output Summary (Last 24 hours) at 01/01/2022 1323 ?Last data filed at  01/01/2022 1200 ?Gross per 24 hour  ?Intake 497.07 ml  ?Output 1200 ml  ?Net -702.93 ml  ? ? ? ?Wt Readings from Last 3 Encounters:  ?12/31/21 102 kg  ?12/25/21 103 kg  ?11/28/21 103 kg  ? ? ? ?Exam ?General: Alert and oriented x 3, NAD ?Cardiovascular: Irregularly irregular, tachycardia ?Respiratory: Clear to auscultation bilaterally, no wheezing ?Gastrointestinal: Soft, nontender, nondistended, + bowel sounds ?Ext: no pedal edema bilaterally ?Neuro: Strength 5/5 upper and lower extremities bilaterally ?Skin: No rashes ?Psych: Normal affect and demeanor, alert and oriented x3  ?   GU: Foley, hematuria ? ? ?Data Reviewed:  I have personally reviewed following labs  ? ? ?CBC ?Lab Results  ?Component Value Date  ? WBC 5.2 01/01/2022  ? RBC 2.53 (L) 01/01/2022  ? HGB 7.6 (L) 01/01/2022  ? HCT 25.4 (L) 01/01/2022  ? MCV 100.4 (H) 01/01/2022  ? MCH 30.0 01/01/2022  ? PLT 45 (L) 01/01/2022  ? MCHC 29.9 (L) 01/01/2022  ? RDW 22.0 (H) 01/01/2022  ? LYMPHSABS 0.5 (L) 01/01/2022  ? MONOABS 0.3 01/01/2022  ? EOSABS 0.0 01/01/2022  ? BASOSABS 0.0 01/01/2022  ? ? ? ?Last metabolic panel ?Lab Results  ?Component Value Date  ? NA 137 01/01/2022  ? K 4.8 01/01/2022  ? CL 112 (H) 01/01/2022  ? CO2 17 (L) 01/01/2022  ? BUN 36 (H) 01/01/2022  ? CREATININE 1.26 (H) 01/01/2022  ? GLUCOSE 157 (H) 01/01/2022  ? GFRNONAA >60 01/01/2022  ? GFRAA 43 (L) 10/10/2019   ? CALCIUM 6.6 (L) 01/01/2022  ? PHOS 2.0 (L) 11/28/2021  ? PROT 5.5 (L) 12/31/2021  ? ALBUMIN <1.5 (L) 12/31/2021  ? BILITOT 0.8 12/31/2021  ? ALKPHOS 198 (H) 12/31/2021  ? AST 25 12/31/2021  ? ALT 13 12/31/2021  ? ANIONGAP 8 01/01/2022  ?

## 2022-01-01 NOTE — Consult Note (Signed)
?Consultation Note ?Date: 01/01/2022  ? ?Patient Name: Tommy Yang  ?DOB: [birth year]  MRN: [id number]  Age / Sex: 71 y.o., male  ?PCP: [name], MD ?Referring Physician: Mendel Corning, MD ? ?Reason for Consultation: Establishing goals of care ? ?HPI/Patient Profile: 71 y.o. male  with past medical history of A-fib, recent SDH, DM 2, hyperlipidemia, prostrate CA with bone mets, and urinary retention admitted on 12/30/2021 with generalized weakness. He was recently discharged from the hospital for a visit involving a fall and SDH.  Since discharge patient has become more weak and short of breath.  P.o. intake is also been very poor.  Patient also has chronic hematuria and anemia.  This admission patient with ongoing A-fib RVR that has been difficult to control despite different medications.  Patient not a candidate for cardioversion.  Patient also with chronic hypotension.  PMT consulted to discuss goals of care. ? ?Clinical Assessment and Goals of Care: ?I have reviewed medical records including EPIC notes, labs and imaging, received report from RN, assessed the patient and then met with patient and wife to discuss diagnosis prognosis, GOC, EOL wishes, disposition and options. ? ?I introduced Palliative Medicine as specialized medical care for people living with serious illness. It focuses on providing relief from the symptoms and stress of a serious illness. The goal is to improve quality of life for both the patient and the family. ? ?We discussed a brief life review of the patient.  Patient tells me about his former careers.  Patient tells me about his previous hospitalizations and his frustration with him.  Patient tells me about his political views. ? ?As far as functional and nutritional status wife and patient explain a rapid decline at home.  Patient had become so weak he could not ambulate or even sit up.  They also speak of drastic decline in  appetite-essentially not eating or drinking. ? ? We discussed patient's current illness and what it means in the larger context of patient's on-going co-morbidities.  Natural disease trajectory and expectations at EOL were discussed.  We discussed his metastatic cancer and poor baseline frailty.  We discussed his A-fib RVR that remains uncontrolled despite different medications trialed.  We discussed his poor appetite. ? ?I attempted to elicit values and goals of care important to the patient.   ? ?The difference between aggressive medical intervention and comfort care was considered in light of the patient's goals of care.  After describing a continued aggressive care path versus a comfort path patient and wife both elected a comfort focused path. ? ?Encouraged patient to consider DNR/DNI status understanding evidenced based poor outcomes in similar hospitalized patients, as the cause of the arrest is likely associated with chronic/terminal disease rather than a reversible acute cardio-pulmonary event.  Patient agrees to DNR/DNI. ? ?Hospice services outpatient were explained and offered.  We discussed that hospice is for patients who are nearing end-of-life.  We discussed that the hospice facility specifically for people in their days to weeks.  We discussed hospice facility will focus on his comfort and that aggressive medical interventions will be avoided.  Patient and wife agree to refer patient to hospice facility. ? ?Patient denies pain, shortness of breath, nausea.  At this point he does not feel he needs additional medications for symptom management. ? ?Questions and concerns were addressed. The family was encouraged to call with questions or concerns. ? ?Primary Decision Maker ?PATIENT ?Wife if patient unable ? ?SUMMARY OF RECOMMENDATIONS   ?  Change CODE STATUS to DNR ?Referral made to hospice facility in [place] ?Continue current measures while we await hospice placement ? ?Code Status/Advance Care  Planning: ?DNR ? ?Prognosis: With poor p.o. intake, uncontrolled A-fib RVR and metastatic cancer prognosis is poor; especially once aggressive medical interventions are discontinued ? ?  ? ?Primary Diagnoses: ?Present on Admission: ? Prostate cancer ([place]) ? Essential hypertension ? Hyperlipidemia ? Subdural hematoma (H[facility]) ? Atrial fibrillation with RVR ([place]) ? ? ?I have reviewed the medical record, interviewed the patient and family, and examined the patient. The following aspects are pertinent. ? ?Past Medical History:  ?Diagnosis Date  ? Arthritis   ? Atrial fibrillation ([place])   ? Complication of anesthesia   ? needs to sit up  at 45 degree angle or gets afib during surgery or in recovery  ? Diabetes mellitus without complication ([place])   ? type 2  ? DVT (deep venous thrombosis) ([place]) 2015  ? left leg.behind knee  ? Foley catheter in place last changed 2eeks ago  ? last 7 months  ? GERD (gastroesophageal reflux disease)   ? Hx of radiation therapy 40 tx 2015  ? Hypercholesterolemia   ? Hypertension   ? Malignant neoplasm of prostate metastatic to bone ([place]) 04/30/2021  ? Prostate cancer ([place]) 12/06/13  ? gleason 4+3=7, 6/12 cores positive. seed implant and radiation  ? Tourette's syndrome   ? Urinary retention   ? Wears glasses   ? ?Social History  ? ?Socioeconomic History  ? Marital status: Married  ?  Spouse name: Not on file  ? Number of children: 1  ? Years of education: Not on file  ? Highest education level: Not on file  ?Occupational History  ?  Employer: RETIRED  ?Tobacco Use  ? Smoking status: Former  ?  Packs/day: 0.50  ?  Years: 23.00  ?  Pack years: 11.50  ?  Types: Cigarettes  ?  Quit date: 08/01/2012  ?  Years since quitting: 9.4  ? Smokeless tobacco: Never  ? Tobacco comments:  ?  Former smoker 10/24/21  ?Vaping Use  ? Vaping Use: Never used  ?Substance and Sexual Activity  ? Alcohol use: Not Currently  ?  Comment: very seldom, maybe 6x per year  ? Drug use: Never  ? Sexual activity: Not Currently  ?   Birth control/protection: Abstinence  ?Other Topics Concern  ? Not on file  ?Social History Narrative  ? Married, lives with spouse  ? 1 child, lives in [place] with his wife and 3 children  ? Retired Office manager  ? 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 Travels to [place] every year, last was June 2017  ? ?Social Determinants of Health  ? ?Financial Resource Strain: Not on file  ?Food Insecurity: Not on file  ?Transportation Needs: Not on file  ?Physical Activity: Not on file  ?Stress: Not on file  ?Social Connections: Not on file  ? ?Family History  ?Problem Relation Age of Onset  ? Heart disease Mother   ?     before age 75  ? Diabetes Mother   ? Hyperlipidemia Mother   ? Varicose Veins Mother   ? Hypertension Mother   ? Heart disease Father   ? Diabetes Father   ? Hyperlipidemia Father   ? Hypertension Father   ? Cancer Brother   ? COPD Brother   ? Diabetes Brother   ? Hyperlipidemia Brother   ? Hypertension  Brother   ? ?Scheduled Meds: ? atorvastatin  80 mg Oral q morning  ? Chlorhexidine Gluconate Cloth  6 each Topical Daily  ? digoxin  0.125 mg Oral Daily  ? folic acid  1 mg Oral Daily  ? insulin aspart  0-15 Units Subcutaneous TID WC  ? insulin aspart  0-5 Units Subcutaneous QHS  ? lactose free nutrition  237 mL Oral BID BM  ? metoprolol tartrate  12.5 mg Oral BID  ? midodrine  5 mg Oral TID WC  ? multivitamin with minerals  1 tablet Oral Daily  ? pantoprazole  40 mg Oral q morning  ? sodium bicarbonate  650 mg Oral BID  ? ?Continuous Infusions: ? lactated ringers 75 mL/hr at 01/01/22 1338  ? ?PRN Meds:.acetaminophen **OR** acetaminophen, loperamide, ondansetron **OR** ondansetron (ZOFRAN) IV, sodium chloride ?Allergies  ?Allergen Reactions  ? Shingrix [Zoster Vac Recomb Adjuvanted] Shortness Of Breath and Other (See Comments)  ?  Pulse increased to 170+ and it affected the A-Fib  ? ?Review of Systems  ?Constitutional:  Positive for activity change, appetite change  and fatigue.  ?Neurological:  Positive for weakness.  ? ?Physical Exam ?Constitutional:   ?   General: He is not in acute distress. ?   Appearance: He is ill-appearing.  ?Cardiovascular:  ?   Rate and Rhythm: Tachycar

## 2022-01-02 DIAGNOSIS — R5381 Other malaise: Secondary | ICD-10-CM | POA: Diagnosis not present

## 2022-01-02 DIAGNOSIS — I4891 Unspecified atrial fibrillation: Secondary | ICD-10-CM | POA: Diagnosis not present

## 2022-01-02 DIAGNOSIS — C7951 Secondary malignant neoplasm of bone: Secondary | ICD-10-CM

## 2022-01-02 DIAGNOSIS — I959 Hypotension, unspecified: Secondary | ICD-10-CM

## 2022-01-02 DIAGNOSIS — N179 Acute kidney failure, unspecified: Secondary | ICD-10-CM | POA: Diagnosis not present

## 2022-01-02 DIAGNOSIS — R531 Weakness: Secondary | ICD-10-CM | POA: Diagnosis not present

## 2022-01-02 DIAGNOSIS — I10 Essential (primary) hypertension: Secondary | ICD-10-CM

## 2022-01-02 LAB — GLUCOSE, CAPILLARY
Glucose-Capillary: 144 mg/dL — ABNORMAL HIGH (ref 70–99)
Glucose-Capillary: 134 mg/dL — ABNORMAL HIGH (ref 70–99)
Glucose-Capillary: 206 mg/dL — ABNORMAL HIGH (ref 70–99)
Glucose-Capillary: 119 mg/dL — ABNORMAL HIGH (ref 70–99)

## 2022-01-02 NOTE — Progress Notes (Addendum)
Manufacturing engineer [facility]) Hospital Liaison Note ? ?Referral received for patient/family interest in [facility]. Chart under review by [facility] physician ? ?Hospice eligibility confirmed.  ? ?Bed offered for today, but due to late move wife has agreed to move tomorrow. ? ?Please call with any questions or concerns. Thank you ? ?Roselee Nova, LCSW ?[facility] Liaison ?[phone] ?

## 2022-01-02 NOTE — Progress Notes (Signed)
PT Cancellation Note ? ?Patient Details ?Name: Tommy Yang ?MRN: [id number] ?DOB: 1950-09-30 ? ? ?Cancelled Treatment:    Reason Eval/Treat Not Completed: Medical issues which prohibited therapy;Other (comment) (Patient continues to be in Afib with poorly controled RVR. BP remains low. Patient may be transitioning to residential hospice care, will hold off and await further [place] meeting with [facility] team.) ? ? ?Gwynneth Albright PT, DPT ?Acute Rehabilitation Services ?Office [phone] ?Pager [phone]  ?01/02/2022, 8:27 AM ?

## 2022-01-02 NOTE — Progress Notes (Signed)
[place]ellow MEW Note.  ?Transfer in from ICU.  ?Vitals have improved from previous measurements but remains in [place].  ? ? 01/02/22 1830  ?Assess: MEWS Score  ?Temp (!) 97.3 ?F (36.3 ?C)  ?BP 108/69  ?Pulse Rate (!) 128  ?Resp (!) 21  ?SpO2 96 %  ?O2 Device Room Air  ?Assess: MEWS Score  ?MEWS Temp 0  ?MEWS Systolic 0  ?MEWS Pulse 2  ?MEWS RR 1  ?MEWS LOC 0  ?MEWS Score 3  ?MEWS Score Color Yellow  ?Assess: if the MEWS score is Yellow or Red  ?Were vital signs taken at a resting state? Yes  ?Focused Assessment No change from prior assessment  ?Does the patient meet 2 or more of the SIRS criteria? Yes  ?Does the patient have a confirmed or suspected source of infection? Yes  ?Provider and Rapid Response Notified? No ?(Providers aware, previously RED in ICU)  ?MEWS guidelines implemented *See Row Information* Yes  ?Treat  ?Pain Scale 0-10  ?Pain Score 0  ?Take Vital Signs  ?Increase Vital Sign Frequency  Yellow: Q 2hr X 2 then Q 4hr X 2, if remains yellow, continue Q 4hrs  ?Escalate  ?MEWS: Escalate Yellow: discuss with charge nurse/RN and consider discussing with provider and RRT  ?Notify: Charge Nurse/RN  ?Name of Charge Nurse/RN Notified Pamala Hurry, RN  ?Date Charge Nurse/RN Notified 01/02/22  ?Time Charge Nurse/RN Notified [id number]  ?Document  ?Patient Outcome Other (Comment) ?(remains on current unit, improvement from previous. no interventions necessary at this time)  ?Progress note created (see row info) Yes  ?Assess: SIRS CRITERIA  ?SIRS Temperature  0  ?SIRS Pulse 1  ?SIRS Respirations  1  ?SIRS WBC 0  ?SIRS Score Sum  2  ? ? ?

## 2022-01-02 NOTE — Progress Notes (Signed)
OT Cancellation Note ? ?Patient Details ?Name: DIAZ CRAGO ?MRN: [id number] ?DOB: 1951/03/03 ? ? ?Cancelled Treatment:    Reason Eval/Treat Not Completed: Medical issues which prohibited therapy Per chart review patient/family interested in hospice. Will check back regarding [place].  ? ?Delbert Phenix OT ?OT pager: [phone] ? ? ?Rosemary Holms ?01/02/2022, 11:30 AM ?

## 2022-01-02 NOTE — TOC Progression Note (Addendum)
Transition of Care (TOC) - Progression Note  ? ? ?Patient Details  ?Name: Tommy Yang ?MRN: [id number] ?Date of Birth: [birth year] ? ?Transition of Care (TOC) CM/SW Contact  ?Dessa Phi, RN ?Phone Number: ?01/02/2022, 10:34 AM ? ?Clinical Narrative: Referral for Residential hospice-spoke to Doreen spouse-agree to [employer] main tel# left vm for referral-await response, & eval.   ? ?-2:58p approved for [facility]-no beds today-f/u in am. ? ? ? ?Expected Discharge Plan: [place] ?Barriers to Discharge: Continued Medical Work up ? ?Expected Discharge Plan and Services ?Expected Discharge Plan: [place] ?  ?Discharge Planning Services: CM Consult ?  ?Living arrangements for the past 2 months: [place] ?                ?  ?  ?  ?  ?  ?  ?  ?  ?  ?  ? ? ?Social Determinants of Health (SDOH) Interventions ?  ? ?Readmission Risk Interventions ? ?  01/01/2022  ?  9:45 AM  ?Readmission Risk Prevention Plan  ?Transportation Screening Complete  ?Medication Review Press photographer) Complete  ?PCP or Specialist appointment within 3-5 days of discharge Complete  ?[place] or Home Care Consult Complete  ?SW Recovery Care/Counseling Consult Complete  ?Palliative Care Screening Not Applicable  ?[place] Not Applicable  ? ? ?

## 2022-01-02 NOTE — Plan of Care (Signed)
?  Problem: Clinical Measurements: ?Goal: Respiratory complications will improve ?Outcome: Progressing ?  ?Problem: Clinical Measurements: ?Goal: Cardiovascular complication will be avoided ?Outcome: Not Progressing ?  ?Problem: Activity: ?Goal: Risk for activity intolerance will decrease ?Outcome: Not Progressing ?  ?Problem: Nutrition: ?Goal: Adequate nutrition will be maintained ?Outcome: Not Progressing ?  ?

## 2022-01-02 NOTE — Progress Notes (Signed)
?Cardiology Progress Note  ?Patient ID: Tommy Yang ?MRN: [id number] ?DOB: [birth year] ?Date of Encounter: 01/02/2022 ? ?Primary Cardiologist: Pixie Casino, MD ? ?Subjective  ? ?Chief Complaint: Weakness ? ?HPI: Plan to transition to hospice care.  Agree with this.  Heart rates still difficult to control.  No room on blood pressure.  Not a candidate for anticoagulation or cardioversion. ? ?ROS:  ?All other ROS reviewed and negative. Pertinent positives noted in the HPI.    ? ?Inpatient Medications  ?Scheduled Meds: ? atorvastatin  80 mg Oral q morning  ? Chlorhexidine Gluconate Cloth  6 each Topical Daily  ? digoxin  0.125 mg Oral Daily  ? folic acid  1 mg Oral Daily  ? insulin aspart  0-15 Units Subcutaneous TID WC  ? insulin aspart  0-5 Units Subcutaneous QHS  ? lactose free nutrition  237 mL Oral BID BM  ? metoprolol tartrate  12.5 mg Oral BID  ? midodrine  5 mg Oral TID WC  ? multivitamin with minerals  1 tablet Oral Daily  ? pantoprazole  40 mg Oral q morning  ? sodium bicarbonate  650 mg Oral BID  ? ?Continuous Infusions: ? lactated ringers 75 mL/hr at 01/01/22 1900  ? ?PRN Meds: ?acetaminophen **OR** acetaminophen, loperamide, LORazepam, ondansetron **OR** ondansetron (ZOFRAN) IV, sodium chloride  ? ?Vital Signs  ? ?Vitals:  ? 01/02/22 0400 01/02/22 0434 01/02/22 0744 01/02/22 0800  ?BP: 92/66   100/65  ?Pulse: (!) 136   (!) 133  ?Resp: (!) 28   20  ?Temp: 97.6 ?F (36.4 ?C)  (!) 97.5 ?F (36.4 ?C)   ?TempSrc: Oral  Oral   ?SpO2: 96%   95%  ?Weight:  99.5 kg    ?Height:      ? ? ?Intake/Output Summary (Last 24 hours) at 01/02/2022 1104 ?Last data filed at 01/02/2022 0539 ?Gross per 24 hour  ?Intake 1065 ml  ?Output 1000 ml  ?Net 65 ml  ? ? ?  01/02/2022  ?  4:34 AM 12/31/2021  ?  5:00 AM 12/30/2021  ?  8:05 PM  ?Last 3 Weights  ?Weight (lbs) 219 lb 5.7 oz 224 lb 13.9 oz 224 lb 13.9 oz  ?Weight (kg) 99.5 kg 102 kg 102 kg  ?   ? ?Telemetry  ?Overnight telemetry shows A-fib 130-150 bpm, which I personally reviewed.   ? ? ?Physical Exam  ? ?Vitals:  ? 01/02/22 0400 01/02/22 0434 01/02/22 0744 01/02/22 0800  ?BP: 92/66   100/65  ?Pulse: (!) 136   (!) 133  ?Resp: (!) 28   20  ?Temp: 97.6 ?F (36.4 ?C)  (!) 97.5 ?F (36.4 ?C)   ?TempSrc: Oral  Oral   ?SpO2: 96%   95%  ?Weight:  99.5 kg    ?Height:      ?  ?Intake/Output Summary (Last 24 hours) at 01/02/2022 1104 ?Last data filed at 01/02/2022 0539 ?Gross per 24 hour  ?Intake 1065 ml  ?Output 1000 ml  ?Net 65 ml  ?  ? ?  01/02/2022  ?  4:34 AM 12/31/2021  ?  5:00 AM 12/30/2021  ?  8:05 PM  ?Last 3 Weights  ?Weight (lbs) 219 lb 5.7 oz 224 lb 13.9 oz 224 lb 13.9 oz  ?Weight (kg) 99.5 kg 102 kg 102 kg  ?  Body mass index is 30.59 kg/m?.  ?General: Well nourished, well developed, in no acute distress ?Head: Atraumatic, normal size  ?Eyes: PEERLA, EOMI  ?Neck: Supple, no JVD ?Endocrine:  No thryomegaly ?Cardiac: Normal S1, S2; irregular rhythm ?Lungs: Clear to auscultation bilaterally, no wheezing, rhonchi or rales  ?Abd: Soft, nontender, no hepatomegaly  ?Ext: No edema, pulses 2+ ?Musculoskeletal: No deformities, BUE and BLE strength normal and equal ?Skin: Warm and dry, no rashes   ?Neuro: Alert and oriented to person, place, time, and situation, CNII-XII grossly intact, no focal deficits  ?Psych: Normal mood and affect  ? ?Labs  ?High Sensitivity Troponin:   ?Recent Labs  ?Lab 12/30/21 ?1021 12/30/21 ?1213  ?TROPONINIHS 7 5  ?   ?Cardiac EnzymesNo results for input(s): TROPONINI in the last 168 hours. No results for input(s): TROPIPOC in the last 168 hours.  ?Chemistry ?Recent Labs  ?Lab 12/30/21 ?1021 12/31/21 ?0404 01/01/22 ?0239  ?NA 135 136 137  ?K 4.6 4.2 4.8  ?CL 109 110 112*  ?CO2 17* 16* 17*  ?GLUCOSE 235* 177* 157*  ?BUN 38* 37* 36*  ?CREATININE 1.33* 1.17 1.26*  ?CALCIUM 7.3* 6.7* 6.6*  ?PROT 6.3* 5.5*  --   ?ALBUMIN 1.7* <1.5*  --   ?AST 21 25  --   ?ALT 13 13  --   ?ALKPHOS 232* 198*  --   ?BILITOT 0.9 0.8  --   ?GFRNONAA 58* >60 >60  ?ANIONGAP '9 10 8  '$ ?  ?Hematology ?Recent Labs  ?Lab  12/31/21 ?0404 12/31/21 ?1657 01/01/22 ?0530  ?WBC 5.4 5.2 5.2  ?RBC 2.61* 2.69* 2.53*  ?HGB 7.9* 8.2* 7.6*  ?HCT 25.6* 26.7* 25.4*  ?MCV 98.1 99.3 100.4*  ?MCH 30.3 30.5 30.0  ?MCHC 30.9 30.7 29.9*  ?RDW 21.4* 21.6* 22.0*  ?PLT 46* 46* 45*  ? ?BNPNo results for input(s): BNP, PROBNP in the last 168 hours.  ?DDimer No results for input(s): DDIMER in the last 168 hours.  ? ?Radiology  ?No results found. ? ? 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 ? ?Assessment & Plan  ? ?#A-fib with RVR ?-Chronic problem.  Driven by anemia, decreased oral intake and malignancy. ?-Also has persistent hypotension which is not helping. ?-Echo normal. ?-We have added digoxin 0.125 mg daily.  Added back metoprolol 12.5 twice daily.  Rates marginally better.  Plans for referral to hospice.  I think this is the best option.  He has multiple comorbidities that are not going to get better.  His A-fib is very difficult to control.  His family cannot take care of him at home.  This is the best option. ?-Can continue these rate control strategies for comfort. ?-No anticoagulation. ?-Would also continue midodrine for comfort. ? ?#Hypotension ?-Procalcitonin borderline.  Blood cultures negative.  Urine culture with yeast.  Chest x-ray with possible pneumonia.  Antibiotics stopped as no obvious source of infection was found.  His hypotension appears to be chronic.  I have added midodrine.  Can continue this for comfort.  Will defer to the hospice team. ? ?#Anemia ?#Fall ?#Recent subdural hematoma ?-Not a candidate for anticoagulation.  Moving to hospice care. ? ?[facility] will sign off.   ?Medication Recommendations: As above ?Other recommendations (labs, testing, etc): None. ?Follow up as an outpatient:  None needed.  The patient is transitioning to hospice care. ? ?For questions or updates, please contact [place] ?Please consult [email or web address] for contact info under  ? ?Signed, ?[place] T. Audie Box, MD, [facility] ?[place]  ?  01/02/2022 11:04 AM  ? ?

## 2022-01-02 NOTE — Progress Notes (Signed)
?                                                   ?Daily Progress Note  ? ?Patient Name: Tommy Yang       Date: 01/02/2022 ?DOB: 11/29/1950  Age: 71 y.o. MRN#: [id number] ?Attending Physician: Mendel Corning, MD ?Primary Care Physician: [name], MD ?Admit Date: 12/30/2021 ? ?Reason for Consultation/Follow-up: Establishing goals of care ? ?Subjective: ?Feels same as yesterday - wife at  bedside ? ?Length of Stay: 3 ? ?Current Medications: ?Scheduled Meds:  ? atorvastatin  80 mg Oral q morning  ? Chlorhexidine Gluconate Cloth  6 each Topical Daily  ? digoxin  0.125 mg Oral Daily  ? folic acid  1 mg Oral Daily  ? insulin aspart  0-15 Units Subcutaneous TID WC  ? insulin aspart  0-5 Units Subcutaneous QHS  ? lactose free nutrition  237 mL Oral BID BM  ? metoprolol tartrate  12.5 mg Oral BID  ? midodrine  5 mg Oral TID WC  ? multivitamin with minerals  1 tablet Oral Daily  ? pantoprazole  40 mg Oral q morning  ? sodium bicarbonate  650 mg Oral BID  ? ? ?Continuous Infusions: ? lactated ringers 75 mL/hr at 01/01/22 1900  ? ? ?PRN Meds: ?acetaminophen **OR** acetaminophen, loperamide, LORazepam, ondansetron **OR** ondansetron (ZOFRAN) IV, sodium chloride ? ?Physical Exam ?Constitutional:   ?   General: He is not in acute distress. ?   Appearance: He is ill-appearing.  ?Pulmonary:  ?   Effort: Pulmonary effort is normal.  ?Skin: ?   General: Skin is warm and dry.  ?   Coloration: Skin is pale.  ?Neurological:  ?   Mental Status: He is alert and oriented to person, place, and time.  ?         ? ?Vital Signs: BP 100/65 (BP Location: Left Arm)   Pulse (!) 133   Temp 98.3 ?F (36.8 ?C) (Oral)   Resp 20   Ht '5\' 11"'$  (1.803 m)   Wt 99.5 kg   SpO2 95%   BMI 30.59 kg/m?  ?SpO2: SpO2: 95 % ?O2 Device: O2 Device: Room Air ?O2 Flow Rate: O2 Flow Rate (L/min): 2  L/min ? ?Intake/output summary:  ?Intake/Output Summary (Last 24 hours) at 01/02/2022 1329 ?Last data filed at 01/02/2022 0539 ?Gross per 24 hour  ?Intake 1040.57 ml  ?Output 1000 ml  ?Net 40.57 ml  ? ?LBM: Last BM Date : 01/01/22 ?Baseline Weight: Weight: 103.9 kg ?Most recent weight: Weight: 99.5 kg ? ?     ?Palliative Assessment/Data: PPS 20% ? ? ? ?Flowsheet Rows   ? ?Flowsheet Row Most Recent Value  ?Intake Tab   ?Referral Department Hospitalist  ?Unit at Time of Referral Intermediate Care Unit  ?Palliative Care Primary Diagnosis Cancer  ?Date Notified 01/01/22  ?Palliative Care Type New Palliative care  ?Reason for referral Clarify Goals of Care  ?Date of Admission 12/30/21  ?Date first seen by Palliative Care 01/01/22  ?# of days Palliative referral response time 0 Day(s)  ?# of days IP prior to Palliative referral 2  ?Clinical Assessment   ?Psychosocial & Spiritual Assessment   ?Palliative Care Outcomes   ? ?  ? ? ?Patient Active Problem List  ? Diagnosis Date Noted  ? Malnutrition of moderate degree  12/31/2021  ? Pneumonia 12/30/2021  ? SIRS (systemic inflammatory response syndrome) ([place]) 12/30/2021  ? Normocytic anemia 12/30/2021  ? Thrombocytopenia ([place]) 12/30/2021  ? Hematuria 12/30/2021  ? Sacral decubitus ulcer 12/30/2021  ? Atrial fibrillation with RVR ([place]) 12/30/2021  ? Hypoglycemia secondary to sulfonylurea 12/24/2021  ? Subdural hematoma ([place]) 12/23/2021  ? DM2 (diabetes mellitus, type 2) ([place]) 11/26/2021  ? Debility 11/26/2021  ? Urinary retention 09/30/2019  ? Obstructive uropathy 08/11/2019  ? AKI (acute kidney injury) ([place]) 08/11/2019  ? Low back pain 08/10/2019  ? Paroxysmal atrial fibrillation with RVR ([place]) 05/23/2019  ? Paroxysmal atrial fibrillation ([place]) 05/23/2019  ? Essential hypertension 09/24/2018  ? Persistent atrial fibrillation   ? Microalbuminuria 04/23/2016  ? Rectal bleeding 07/18/2015  ? Dysphagia 07/18/2015  ? Esophageal reflux 05/09/2014  ? Hyperlipidemia 02/28/2014  ? Prostate  cancer ([place]) 12/14/2013  ? Erectile dysfunction 10/26/2013  ? History of DVT (deep vein thrombosis) 09/28/2013  ? Bilateral lower extremity edema 09/28/2013  ? ? ?Palliative Care Assessment & Plan  ? ?HPI: ?71 y.o. male  with past medical history of A-fib, recent SDH, DM 2, hyperlipidemia, prostrate CA with bone mets, and urinary retention admitted on 12/30/2021 with generalized weakness. He was recently discharged from the hospital for a visit involving a fall and SDH.  Since discharge patient has become more weak and short of breath.  P.o. intake is also been very poor.  Patient also has chronic hematuria and anemia.  This admission patient with ongoing A-fib RVR that has been difficult to control despite different medications.  Patient not a candidate for cardioversion.  Patient also with chronic hypotension.  PMT consulted to discuss goals of care. ? ?Assessment: ?Patient and wife continue to agree to plan to transition to hospice- awaiting  hospice liaisons. All questions and concerns addressed - reviewed type of care provided at hospice focused on comfort.  ? ?Patient denies pain, shortness of breath, nausea.  At this point he does not feel he needs additional medications for symptom management. ? ?Recommendations/Plan: ?Change CODE STATUS to DNR ?Awaiting hospice - anticipate dc to hospice facility ?Continue current measures while we await hospice placement ? ?Code Status: ?DNR ? ?Prognosis: ? With poor p.o. intake, uncontrolled A-fib RVR and metastatic cancer prognosis is poor; especially once aggressive medical interventions are discontinued ? ?Discharge Planning: ?Hospice facility ? ?Care plan was discussed with patient, wife, RN ? ?Thank you for allowing the Palliative Medicine Team to assist in the care of this patient. ? ? ?*Please note that this is a verbal dictation therefore any spelling or grammatical errors are due to the "[place] [employer]" system interpretation. ? ?Juel Burrow, DNP,  AGNP-C ?Palliative Medicine Team ?Team Phone # [phone]  ?Pager [phone] ? ?

## 2022-01-02 NOTE — Progress Notes (Signed)
?      ? ? ? [facility] ?                                                                           ? ? ?Tommy Yang, is a 71 y.o. male, DOB - [birth year], EXB:[id number] ?Admit date - 12/30/2021    ?Outpatient Primary MD for the patient is [name], MD ? ?LOS - 3  days ? ?Chief Complaint  ?Patient presents with  ? Weakness  ?    ? ?Brief summary  ? ?Patient is a 71 year old male with A-fib, recent SDH, DM 2, hyperlipidemia, prostrate CA with bone mets, urinary retention presented with generalized weakness. He was recently discharged from the hospital for a visit involving a fall and SDH. At the time he was found to be supratherapeutic on coumadin and it was held at discharge. Since being home, his wife notes that he has become progressively more weak. He becomes short of breath with movement. He has not had an appetite, and thus, his PO intake has been poor. She has been having difficulty caring for him at home as he has become closer and closer to total assist needs. He is now too weak to sit up.  ? ?Patient has chronic hematuria, has had multiple antibiotics for UTI with no significant improvement in hematuria. ? ?Assessment & Plan  ? ?Permanent A-fib with RVR ?- baseline around 110, no significant effect with amiodarone drip, now discontinued.  Heart rate still persistently elevated in 130s to 140s. ?-Cardiology consulted, very limited options, borderline BP.  Not anticoagulation candidate due to chronic anemia, subdural hematoma and hematuria ?-Continue digoxin 0.125 mg daily, metoprolol 12.5 mg twice daily as tolerated with BP.  Continue midodrine for BP. ?-Appreciate palliative medicine assistance for GOC, overall poor prognosis, plan for hospice.  Referral for [facility], continue current measures for now until bed available ? ? ?Bilateral pneumonia versus atelectasis ?-Asymptomatic, no leukocytosis, fevers, borderline procalcitonin ?-Antibiotics were discontinued on 5/2, will continue to  monitor ?  ?Persistent hematuria, urinary retention with underlying history of metastatic prostate CA ?-Renal ultrasound showed moderate bilateral hydronephrosis, has chronic Foley catheter, chronic hematuria ?-Appreciate urology recommendations, seen by Dr. Wendy Poet on 5/3: continue Foley, currently no need of change, outpatient follow-up ? ?Generalized debility, weakness ?-PT OT evaluation pending, referral for residential hospice sent ? ?History of prostate CA with bony metastasis ?-Continue to follow with urology and oncology ?-Palliative medicine consulted for goals of care, overall poor prognosis ?  ?Acute kidney injury ?-Creatinine 1.3 at the time of admission, baseline 0.9 ?-Improving 1.2, continue sodium bicarb tabs ?  ? ? Anemia of chronic disease/chronic thrombocytopenia:  ?-Monitor H&H, transfuse for hemoglobin less than 7 ? ?Recent subdural hematoma, with fall ?-Per neurosurgery, hold anticoagulation until 01/02/2022 however per cardiology note, not anticoagulation candidate due to SDH, hematuria, thrombocytopenia, anemia  ? ?Hypotension ?-Currently hypotensive, has chronic hypotension, work-up negative for any sepsis ?- continue midodrine ?  ?  ?HLD ?-Continue statin ?  ?Diabetes mellitus type 2 NIDDM  ?-Continue sliding scale insulin while inpatient ?-Hemoglobin A1c 6.0 ?  ? ?Pressure Injury Documentation: ?Pressure Injury 12/30/21 Sacrum Left;Upper;Bilateral Stage 2 -  Partial thickness loss of dermis  presenting as a shallow open injury with a red, pink wound bed without slough. bilateral small sacral wounds (Active)  ?12/30/21 2057  ?Location: Sacrum  ?Location Orientation: Left;Upper;Bilateral  ?Staging: Stage 2 -  Partial thickness loss of dermis presenting as a shallow open injury with a red, pink wound bed without slough.  ?Wound Description (Comments): bilateral small sacral wounds  ?Present on Admission: Yes  ?Dressing Type Foam - Lift dressing to assess site every shift 01/01/22 1600   ? ? ?Moderate protein calorie malnutrition ?-Continue nutritional supplements ? ? ? ?Code Status: DNR ? ?DVT Prophylaxis:  SCDs Start: 12/30/21 1955 ?SCDs Start: 12/30/21 1955 ? ? ?Level of Care: Level of care: Stepdown ?Family Communication: Updated patient ? ?Disposition Plan:      Remains inpatient appropriate: Heart rate still not well controlled.  [facility] ? ? ?Procedures:  ?2D echo showed EF of 55 to 60%, no regional wall motion abnormality ? ?Consultants:   ?Cardiology ?Urology ?Palliative medicine ? ?Antimicrobials:  ? ? ? ?Medications ? atorvastatin  80 mg Oral q morning  ? Chlorhexidine Gluconate Cloth  6 each Topical Daily  ? digoxin  0.125 mg Oral Daily  ? folic acid  1 mg Oral Daily  ? insulin aspart  0-15 Units Subcutaneous TID WC  ? insulin aspart  0-5 Units Subcutaneous QHS  ? lactose free nutrition  237 mL Oral BID BM  ? metoprolol tartrate  12.5 mg Oral BID  ? midodrine  5 mg Oral TID WC  ? multivitamin with minerals  1 tablet Oral Daily  ? pantoprazole  40 mg Oral q morning  ? sodium bicarbonate  650 mg Oral BID  ? ? ? ? ?Subjective:  ? ?[name] was seen and examined today.  Did not have a good night, feeling fatigued today.  Heart rate still not well controlled, limited options, patient understands.  BP borderline soft.   ? ?Objective:  ? ?Vitals:  ? 01/02/22 0434 01/02/22 0744 01/02/22 0800 01/02/22 1120  ?BP:   100/65   ?Pulse:   (!) 133   ?Resp:   20   ?Temp:  (!) 97.5 ?F (36.4 ?C)  98.3 ?F (36.8 ?C)  ?TempSrc:  Oral  Oral  ?SpO2:   95%   ?Weight: 99.5 kg     ?Height:      ? ? ?Intake/Output Summary (Last 24 hours) at 01/02/2022 1255 ?Last data filed at 01/02/2022 0539 ?Gross per 24 hour  ?Intake 1048.31 ml  ?Output 1000 ml  ?Net 48.31 ml  ? ? ? ?Wt Readings from Last 3 Encounters:  ?01/02/22 99.5 kg  ?12/25/21 103 kg  ?11/28/21 103 kg  ? ? ?Physical Exam ?General: Alert and oriented x 3, NAD ?Cardiovascular: Irregularly irregular, tachycardia ?Respiratory: Diminished breath  sound at the bases ?Gastrointestinal: Soft, nontender, nondistended, NBS ?Ext: no pedal edema bilaterally ?Neuro: no new deficits ?Psych: somewhat flat affect today ? ? ? ?Data Reviewed:  I have personally reviewed following labs  ? ? ?CBC ?Lab Results  ?Component Value Date  ? WBC 5.2 01/01/2022  ? RBC 2.53 (L) 01/01/2022  ? HGB 7.6 (L) 01/01/2022  ? HCT 25.4 (L) 01/01/2022  ? MCV 100.4 (H) 01/01/2022  ? MCH 30.0 01/01/2022  ? PLT 45 (L) 01/01/2022  ? MCHC 29.9 (L) 01/01/2022  ? RDW 22.0 (H) 01/01/2022  ? LYMPHSABS 0.5 (L) 01/01/2022  ? MONOABS 0.3 01/01/2022  ? EOSABS 0.0 01/01/2022  ? BASOSABS 0.0 01/01/2022  ? ? ? ?Last metabolic  panel ?Lab Results  ?Component Value Date  ? NA 137 01/01/2022  ? K 4.8 01/01/2022  ? CL 112 (H) 01/01/2022  ? CO2 17 (L) 01/01/2022  ? BUN 36 (H) 01/01/2022  ? CREATININE 1.26 (H) 01/01/2022  ? GLUCOSE 157 (H) 01/01/2022  ? GFRNONAA >60 01/01/2022  ? GFRAA 43 (L) 10/10/2019  ? CALCIUM 6.6 (L) 01/01/2022  ? PHOS 2.0 (L) 11/28/2021  ? PROT 5.5 (L) 12/31/2021  ? ALBUMIN <1.5 (L) 12/31/2021  ? BILITOT 0.8 12/31/2021  ? ALKPHOS 198 (H) 12/31/2021  ? AST 25 12/31/2021  ? ALT 13 12/31/2021  ? ANIONGAP 8 01/01/2022  ? ? ?CBG (last 3)  ?Recent Labs  ?  01/01/22 ?2139 01/02/22 ?0735 01/02/22 ?1208  ?GLUCAP 133* 144* 134*  ?  ? ? ?Coagulation Profile: ?Recent Labs  ?Lab 12/30/21 ?1021 12/31/21 ?0404  ?INR 1.7* 1.5*  ? ? ? ?Radiology Studies: I have personally reviewed the imaging studies  ?US RENAL ? ?Result Date: 12/30/2021 ?CLINICAL DATA:  Acute kidney injury  ?IMPRESSION: 1. Moderate bilateral hydronephrosis. 2. Left pleural effusion. 3. Cholelithiasis. Electronically Signed   By: Dahlia Bailiff M.D.   On: 12/30/2021 13:55  ? ?ECHOCARDIOGRAM COMPLETE ? ?Result Date: 12/30/2021 ?IMPRESSIONS  1. Left ventricular ejection fraction, by estimation, is 55 to 60%. The left ventricle has normal function. The left ventricle has no regional wall motion abnormalities. Left ventricular diastolic function  could not be evaluated.  2. Right ventricular systolic function is normal. The right ventricular size is normal. There is normal pulmonary artery systolic pressure.  3. Left atrial size was moderately dilated.  4. Ri

## 2022-01-02 NOTE — Progress Notes (Signed)
?   01/02/22 1954  ?Assess: MEWS Score  ?Temp 98 ?F (36.7 ?C)  ?BP 111/76  ?Pulse Rate (!) 123  ?Resp 18  ?Level of Consciousness Alert  ?SpO2 96 %  ?O2 Device Room Air  ?Assess: MEWS Score  ?MEWS Temp 0  ?MEWS Systolic 0  ?MEWS Pulse 2  ?MEWS RR 0  ?MEWS LOC 0  ?MEWS Score 2  ?MEWS Score Color Yellow  ?Assess: if the MEWS score is Yellow or Red  ?Were vital signs taken at a resting state? Yes  ?Focused Assessment No change from prior assessment  ?Does the patient meet 2 or more of the SIRS criteria? Yes  ?Does the patient have a confirmed or suspected source of infection? Yes  ?Provider and Rapid Response Notified? No  ?MEWS guidelines implemented *See Row Information* Yes  ?Treat  ?Pain Scale 0-10  ?Pain Score 0  ?Take Vital Signs  ?Increase Vital Sign Frequency  Yellow: Q 2hr X 2 then Q 4hr X 2, if remains yellow, continue Q 4hrs  ?Escalate  ?MEWS: Escalate Yellow: discuss with charge nurse/RN and consider discussing with provider and RRT  ?Notify: Charge Nurse/RN  ?Time Charge Nurse/RN Notified 2000  ?Notify: Rapid Response  ?Name of Rapid Response RN Notified not notified  ?Document  ?Patient Outcome Other (Comment)  ?Progress note created (see row info) Yes  ?Assess: SIRS CRITERIA  ?SIRS Temperature  0  ?SIRS Pulse 1  ?SIRS Respirations  0  ?SIRS WBC 0  ?SIRS Score Sum  1  ? ?Pt yellow for HR. Uncontrolled high HR has been his baseline no changes, pt has is asymptomatic. ? ?

## 2022-01-03 DIAGNOSIS — R531 Weakness: Principal | ICD-10-CM

## 2022-01-03 DIAGNOSIS — Z515 Encounter for palliative care: Secondary | ICD-10-CM

## 2022-01-03 DIAGNOSIS — Z66 Do not resuscitate: Secondary | ICD-10-CM

## 2022-01-03 DIAGNOSIS — Z7189 Other specified counseling: Secondary | ICD-10-CM

## 2022-01-03 LAB — GLUCOSE, CAPILLARY
Glucose-Capillary: 120 mg/dL — ABNORMAL HIGH (ref 70–99)
Glucose-Capillary: 125 mg/dL — ABNORMAL HIGH (ref 70–99)

## 2022-01-03 MED ORDER — METOPROLOL TARTRATE 25 MG PO TABS: 12.5000 mg | ORAL_TABLET | Freq: Two times a day (BID) | ORAL | 3 refills | Status: AC

## 2022-01-03 MED ORDER — LOPERAMIDE HCL 2 MG PO CAPS
4.0000 mg | ORAL_CAPSULE | Freq: Once | ORAL | Status: AC
  Administered 2022-01-03: 4 mg via ORAL
  Filled 2022-01-03: qty 2

## 2022-01-03 MED ORDER — DIGOXIN 125 MCG PO TABS: 0.1250 mg | ORAL_TABLET | Freq: Every day | ORAL | 3 refills | Status: AC

## 2022-01-03 MED ORDER — SODIUM BICARBONATE 650 MG PO TABS: 650.0000 mg | ORAL_TABLET | Freq: Two times a day (BID) | ORAL | 3 refills | Status: AC

## 2022-01-03 MED ORDER — MIDODRINE HCL 5 MG PO TABS: 5.0000 mg | ORAL_TABLET | Freq: Three times a day (TID) | ORAL | 3 refills | Status: AC

## 2022-01-03 NOTE — Progress Notes (Signed)
OT Cancellation Note ? ?Patient Details ?Name: ARLYN BUERKLE ?MRN: [id number] ?DOB: [birth year] ? ? ?Cancelled Treatment:    Reason Eval/Treat Not Completed: OT screened, no needs identified, will sign off. Per chart review Palliative states patient discharging to hospice facility. OT to sign off. ? ?Delbert Phenix OT ?OT pager: [phone] ? ? ?Rosemary Holms ?01/03/2022, 6:17 AM ?

## 2022-01-03 NOTE — Progress Notes (Signed)
?                                                   ?Daily Progress Note  ? ?Patient Name: Tommy Yang       Date: 01/03/2022 ?DOB: 29-Apr-1951  Age: 71 y.o. MRN#: [id number] ?Attending Physician: Mendel Corning, MD ?Primary Care Physician: [name], MD ?Admit Date: 12/30/2021 ? ?Reason for Consultation/Follow-up: Establishing goals of care ? ?Subjective: ?Patient expressing anger this morning and situation ?Denies pain, wants to leave the hospital ? ?Length of Stay: 4 ? ?Current Medications: ?Scheduled Meds:  ?? atorvastatin  80 mg Oral q morning  ?? Chlorhexidine Gluconate Cloth  6 each Topical Daily  ?? digoxin  0.125 mg Oral Daily  ?? folic acid  1 mg Oral Daily  ?? insulin aspart  0-15 Units Subcutaneous TID WC  ?? insulin aspart  0-5 Units Subcutaneous QHS  ?? lactose free nutrition  237 mL Oral BID BM  ?? metoprolol tartrate  12.5 mg Oral BID  ?? midodrine  5 mg Oral TID WC  ?? multivitamin with minerals  1 tablet Oral Daily  ?? pantoprazole  40 mg Oral q morning  ?? sodium bicarbonate  650 mg Oral BID  ? ? ?Continuous Infusions: ? ? ? ?PRN Meds: ?acetaminophen **OR** acetaminophen, loperamide, LORazepam, ondansetron **OR** ondansetron (ZOFRAN) IV, sodium chloride ? ?Physical Exam ?Constitutional:   ?   General: He is not in acute distress. ?   Appearance: He is ill-appearing.  ?Pulmonary:  ?   Effort: Pulmonary effort is normal.  ?Skin: ?   General: Skin is warm and dry.  ?   Coloration: Skin is pale.  ?Neurological:  ?   Mental Status: He is alert and oriented to person, place, and time.  ?         ? ?Vital Signs: BP 100/69 (BP Location: Left Arm)   Pulse (!) 118   Temp 98 ?F (36.7 ?C)   Resp 15   Ht '5\' 11"'$  (1.803 m)   Wt 99.5 kg   SpO2 96%   BMI 30.59 kg/m?  ?SpO2: SpO2: 96 % ?O2 Device: O2 Device: Room Air ?O2 Flow Rate: O2 Flow Rate (L/min): 2  L/min ? ?Intake/output summary:  ?Intake/Output Summary (Last 24 hours) at 01/03/2022 1036 ?Last data filed at 01/02/2022 1504 ?Gross per 24 hour  ?Intake 836.2 ml  ?Output --  ?Net 836.2 ml  ? ? ?LBM: Last BM Date : 01/02/22 ?Baseline Weight: Weight: 103.9 kg ?Most recent weight: Weight: 99.5 kg ? ?     ?Palliative Assessment/Data: PPS 20% ? ? ? ?Flowsheet Rows   ? ?Flowsheet Row Most Recent Value  ?Intake Tab   ?Referral Department Hospitalist  ?Unit at Time of Referral Intermediate Care Unit  ?Palliative Care Primary Diagnosis Cancer  ?Date Notified 01/01/22  ?Palliative Care Type New Palliative care  ?Reason for referral Clarify Goals of Care  ?Date of Admission 12/30/21  ?Date first seen by Palliative Care 01/01/22  ?# of days Palliative referral response time 0 Day(s)  ?# of days IP prior to Palliative referral 2  ?Clinical Assessment   ?Psychosocial & Spiritual Assessment   ?Palliative Care Outcomes   ? ?  ? ? ?Patient Active Problem List  ? Diagnosis Date Noted  ?? Malnutrition of moderate degree 12/31/2021  ?? Pneumonia  12/30/2021  ?? SIRS (systemic inflammatory response syndrome) ([place]) 12/30/2021  ?? Normocytic anemia 12/30/2021  ?? Thrombocytopenia ([place]) 12/30/2021  ?? Hematuria 12/30/2021  ?? Sacral decubitus ulcer 12/30/2021  ?? Atrial fibrillation with RVR ([place]) 12/30/2021  ?? Hypoglycemia secondary to sulfonylurea 12/24/2021  ?? Subdural hematoma ([place]) 12/23/2021  ?? DM2 (diabetes mellitus, type 2) ([place]) 11/26/2021  ?? Debility 11/26/2021  ?? Urinary retention 09/30/2019  ?? Obstructive uropathy 08/11/2019  ?? AKI (acute kidney injury) ([place]) 08/11/2019  ?? Low back pain 08/10/2019  ?? Paroxysmal atrial fibrillation with RVR ([place]) 05/23/2019  ?? Paroxysmal atrial fibrillation ([place]) 05/23/2019  ?? Essential hypertension 09/24/2018  ?? Persistent atrial fibrillation   ?? Microalbuminuria 04/23/2016  ?? Rectal bleeding 07/18/2015  ?? Dysphagia 07/18/2015  ?? Esophageal reflux 05/09/2014  ?? Hyperlipidemia  02/28/2014  ?? Prostate cancer ([place]) 12/14/2013  ?? Erectile dysfunction 10/26/2013  ?? History of DVT (deep vein thrombosis) 09/28/2013  ?? Bilateral lower extremity edema 09/28/2013  ? ? ?Palliative Care Assessment & Plan  ? ?HPI: ?71 y.o. male  with past medical history of A-fib, recent SDH, DM 2, hyperlipidemia, prostrate CA with bone mets, and urinary retention admitted on 12/30/2021 with generalized weakness. He was recently discharged from the hospital for a visit involving a fall and SDH.  Since discharge patient has become more weak and short of breath.  P.o. intake is also been very poor.  Patient also has chronic hematuria and anemia.  This admission patient with ongoing A-fib RVR that has been difficult to control despite different medications.  Patient not a candidate for cardioversion.  Patient also with chronic hypotension.  PMT consulted to discuss goals of care. ? ?Assessment: ?Significant discussion with patient and his wife this morning as patient had possibly expressed to some staff he did not want to go to hospice.  When I asked patient about this he says he never said he did not want to go but he does share that he feels the decision was made without him.  We reviewed that he was in the conversation when we made the decision to proceed with hospice referral and that he agreed to it.  He tells me the only reason palliative was involved was because his wife called and I explained that palliative is involved because his doctor requested a palliative consult.  We again review his medical course and lack of options moving forward, review lack of improvement despite aggressive medical care.  Patient ultimately agrees he does want to focus on comfort and go to hospice however is dealing with anger about the situation.  He is not interested in spiritual care consult. ? ?Discussed with Dr. Tana Coast and hospice liaison -likely to DC to hospice today ? ?Patient denies pain, shortness of breath, nausea.  At this  point he does not feel he needs additional medications for symptom management. ? ?Recommendations/Plan: ?CODE STATUS DNR ?Anticipate DC to hospice facility today ?Continue current measures while we await hospice placement ? ?Code Status: ?DNR ? ?Prognosis: ? With poor p.o. intake, uncontrolled A-fib RVR and metastatic cancer prognosis is poor; especially once aggressive medical interventions are discontinued ? ?Discharge Planning: ?Hospice facility ? ?Care plan was discussed with patient, wife, RN ? ?Thank you for allowing the Palliative Medicine Team to assist in the care of this patient. ? ? ?*Please note that this is a verbal dictation therefore any spelling or grammatical errors are due to the "[employer]" system interpretation. ? ?Juel Burrow, DNP, AGNP-C ?Palliative Medicine Team ?  Team Phone # [phone]  ?Pager [phone] ? ?

## 2022-01-03 NOTE — Progress Notes (Signed)
PT Cancellation Note ? ?Patient Details ?Name: Tommy Yang ?MRN: [id number] ?DOB: [birth year] ? ? ?Cancelled Treatment:    Reason Eval/Treat Not Completed: PT screened, no needs identified, will sign off (Pt accepted to [facility] for residential hospice. Will sign off, no skilled PT needs at this time.) ? ?Gwynneth Albright PT, DPT ?Acute Rehabilitation Services ?Office [phone] ?Pager [phone]  ?01/03/2022, 10:03 AM ?

## 2022-01-03 NOTE — Progress Notes (Addendum)
[facility] and report given to RN  ?Pam. ?RN from [facility] wants to keep existing IV. ?

## 2022-01-03 NOTE — Progress Notes (Signed)
Pt alert and oriented, discharged with PTAR. Pt belongings given back. ?

## 2022-01-03 NOTE — Discharge Summary (Signed)
?Physician Discharge Summary ?  ?Patient: Tommy Yang MRN: [id number] DOB: May 27, 1951  ?Admit date:     12/30/2021  ?Discharge date: 01/03/22  ?Discharge Physician: Estill Cotta, MD   ? ?PCP: [name], MD  ? ?Recommendations at discharge:  ? ?Continue Foley catheter, please make an appointment with [facility] for catheter exchange ?Continue digoxin 0.125 mg daily, metoprolol 12.5 mg twice daily ?Started midodrine 5 mg 3 times daily  ?DNR, accepted to residential hospice ? ?Discharge Diagnoses: ? ? ?Permanent atrial fibrillation with RVR ([place]) ?Generalized weakness/debility ?  DM2 (diabetes mellitus, type 2) ([place]) ?  Prostate cancer ([place]) ?  Hyperlipidemia ?  Essential hypertension ?  AKI (acute kidney injury) ([place]) ?  Urinary retention ?Recent subdural hematoma ([facility]) ?  Normocytic anemia ?  Thrombocytopenia ([place]) ?  Hematuria ?  Sacral decubitus ulcer ?  Malnutrition of moderate degree ?  Generalized weakness ?  DNR (do not resuscitate) ? ? ?Hospital Course: ?Patient is a 71 year old male with A-fib, recent SDH, DM 2, hyperlipidemia, prostrate CA with bone mets, urinary retention presented with generalized weakness. He was recently discharged from the hospital for a visit involving a fall and SDH. At the time he was found to be supratherapeutic on coumadin and it was held at discharge. Since being home, his wife notes that he has become progressively more weak. He becomes short of breath with movement. He has not had an appetite, and thus, his PO intake has been poor. She has been having difficulty caring for him at home as he has become closer and closer to total assist needs. He is now too weak to sit up.  ?  ?Patient has chronic hematuria, has had multiple antibiotics for UTI with no significant improvement in hematuria. ? ?Assessment and Plan: ? ?Permanent A-fib with RVR ?- baseline around 110, no significant effect with amiodarone drip, now discontinued.  Heart rate still persistently elevated in  130s to 140s. ?-Cardiology was consulted, very limited options, borderline BP.  Not anticoagulation candidate due to chronic anemia, subdural hematoma and hematuria ?-Continue digoxin 0.125 mg daily, metoprolol 12.5 mg twice daily as tolerated with BP.  Continue midodrine for BP. ?-Appreciate palliative medicine assistance for GOC, overall poor prognosis, plan for hospice, accepted to residential hospice ?  ?  ?Bilateral pneumonia versus atelectasis ?-Asymptomatic, no leukocytosis, fevers, borderline procalcitonin ?-Antibiotics were discontinued on 5/2  ?  ?Persistent hematuria, urinary retention with underlying history of metastatic prostate CA ?-Renal ultrasound showed moderate bilateral hydronephrosis, has chronic Foley catheter, chronic hematuria ?-Appreciate urology recommendations, seen by Dr. Wendy Poet on 5/3: continue Foley, currently no need of change, outpatient follow-up ?  ?Generalized debility, weakness ?-Accepted to residential hospice ?  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  ?Acute kidney injury ?-Creatinine 1.3 at the time of admission, baseline 0.9 ?-Improving 1.2, continue sodium bicarb tabs ?  ?  ? Anemia of chronic disease/chronic thrombocytopenia:  ?-Hemoglobin 7.6 ?  ?Recent subdural hematoma, with fall ?-Per neurosurgery, hold anticoagulation until 01/02/2022 however per cardiology note, not anticoagulation candidate due to SDH, hematuria, thrombocytopenia, anemia  ?  ?Hypotension ?-Currently hypotensive, has chronic hypotension, work-up negative for any sepsis ?- continue midodrine ?  ?  ?HLD ?-Continue statin ?  ?Diabetes mellitus type 2 NIDDM  ?-Continue sliding scale insulin while inpatient ?-Hemoglobin A1c 6.0 ?  ?  ?Pressure Injury Documentation: ?    ?  Pressure Injury 12/30/21 Sacrum Left;Upper;Bilateral Stage 2 -  Partial thickness loss of dermis  presenting as a shallow open injury with a red, pink wound bed without slough. bilateral small sacral wounds (Active)  ?12/30/21 2057  ?Location: Sacrum  ?Location Orientation: Left;Upper;Bilateral  ?Staging: Stage 2 -  Partial thickness loss of dermis presenting as a shallow open injury with a red, pink wound bed without slough.  ?Wound Description (Comments): bilateral small sacral wounds  ?Present on Admission: Yes  ?Dressing Type Foam - Lift dressing to assess site every shift 01/01/22 1600  ?  ?  ?Moderate protein calorie malnutrition ?-Continue nutritional supplements ?  ? ? ?Pain control - [employer] Controlled Substance Reporting System database was reviewed. and patient was instructed, not to drive, operate heavy machinery, perform activities at heights, swimming or participation in water activities or provide baby-sitting services while on Pain, Sleep and Anxiety Medications; until their outpatient Physician has advised to do so again. Also recommended to not to take more than prescribed Pain, Sleep and Anxiety Medications.  ?Consultants: Palliative medicine, cardiology ?Procedures performed: None ?Disposition: Accepted to residential hospice ?Diet recommendation: Carb modified diet ?Discharge Diet Orders (From admission, onward)  ? ?DISCHARGE MEDICATION: ?Allergies as of 01/03/2022   ? ?   Reactions  ? Shingrix [zoster Vac Recomb Adjuvanted] Shortness Of Breath, Other (See Comments)  ? Pulse increased to 170+ and it affected the A-Fib  ? ?  ? ?  ?Medication List  ?  ? ?STOP taking these medications   ? ?amiodarone 200 MG tablet ?Commonly known as: PACERONE ?  ?cephALEXin 250 MG capsule ?Commonly known as: KEFLEX ?  ?Erleada 60 MG tablet ?Generic drug: apalutamide ?  ?furosemide 20 MG tablet ?Commonly known as: LASIX ?  ?warfarin 5 MG tablet ?Commonly known as: COUMADIN ?  ? ?  ? ?TAKE these medications   ? ?atorvastatin 80 MG tablet ?Commonly known as: LIPITOR ?TAKE 1 TABLET BY MOUTH EVERY DAY ?What  changed: when to take this ?  ?blood glucose meter kit and supplies Kit ?Test blood sugar daily as directed. Dx code: E11.9 ?  ?CALCIUM 600 PO ?Take 600 mg by mouth at bedtime. ?  ?digoxin 0.125 MG tablet ?Commonly known as: LANOXIN ?Take 1 tablet (0.125 mg total) by mouth daily. ?Start taking on: Jan 04, 2022 ?  ?diphenoxylate-atropine 2.5-0.025 MG tablet ?Commonly known as: LOMOTIL ?TAKE 1 TABLET BY MOUTH 4 (FOUR) TIMES DAILY AS NEEDED FOR DIARRHEA OR LOOSE STOOLS. ?  ?folic acid 1 MG tablet ?Commonly known as: FOLVITE ?Take 1 tablet (1 mg total) by mouth daily. ?  ?glucose blood test strip ?Commonly known as: Accu-Chek Guide ?Use as instructed to check blood sugar twice daily. ?  ?metoprolol tartrate 25 MG tablet ?Commonly known as: LOPRESSOR ?Take 0.5 tablets (12.5 mg total) by mouth 2 (two) times daily. ?What changed:  ?medication strength ?See the new instructions. ?  ?midodrine 5 MG tablet ?Commonly known as: PROAMATINE ?Take 1 tablet (5 mg total) by mouth 3 (three) times daily with meals. ?  ?Ozempic (0.25 or 0.5 MG/DOSE) 2 MG/1.5ML Sopn ?Generic drug: Semaglutide(0.25 or 0.5MG/DOS) ?Inject 0.5 mg into the skin once a week. ?What changed: when to take this ?  ?pantoprazole 40 MG tablet ?Commonly known as: PROTONIX ?TAKE 1 TABLET BY MOUTH EVERY DAY ?What changed:  ?how to take this ?when to take this ?  ?sodium bicarbonate 650 MG tablet ?Take 1 tablet (650 mg total) by mouth 2 (two) times daily. ?  ?Iva Boop  XR 12.12-998 MG Tb24 ?Generic drug: Empagliflozin-metFORMIN HCl ER ?TAKE 2 TABLETS BY MOUTH EVERY DAY ?What changed:  ?how much to take ?how to take this ?when to take this ?additional instructions ?  ?tamsulosin 0.4 MG Caps capsule ?Commonly known as: FLOMAX ?Take 1 capsule (0.4 mg total) by mouth 2 (two) times daily. ?  ?TYLENOL PO ?Take 2 tablets by mouth every 6 (six) hours as needed (pain/headache). ?  ?VITAMIN B 12 PO ?Take 125 mcg by mouth at bedtime. ?  ?Vitamin D3 50 MCG (2000 UT) Tabs ?Take 4,000  Units by mouth at bedtime. ?  ? ?  ? ?  ?  ? ? ?  ?Discharge Care Instructions  ?(From admission, onward)  ?  ? ? ?  ? ?  Start     Ordered  ? 01/03/22 0000  If the dressing is still on your incision site

## 2022-01-03 NOTE — TOC Transition Note (Signed)
Transition of Care (TOC) - CM/SW Discharge Note ? ? ?Patient Details  ?Name: Tommy Yang ?MRN: [id number] ?Date of Birth: [birth year] ? ?Transition of Care (TOC) CM/SW Contact:  ?Ross Ludwig, LCSW ?Phone Number: ?01/03/2022, 9:24 AM ? ? ?Clinical Narrative:    ? ?CSW informed that patient has a bed available at [facility] today.  CSW notified attending physician and bedside nurse.  Patient to be d/c'ed today to [facility]  Patient and family agreeable to plans will transport via ems RN to call report to [phone] . ? ? ? ? ?Final next level of care: [place] ?Barriers to Discharge: Barriers Resolved ? ? ?Patient Goals and CMS Choice ?Patient states their goals for this hospitalization and ongoing recovery are:: To go to [facility] for end of life care. ?CMS Medicare.gov Compare Post Acute Care list provided to:: Patient Represenative (must comment) ?Choice offered to / list presented to : Spouse ? ?Discharge Placement ?  ?           ?Patient chooses bed at: Other - please specify in the comment section below: [facility]) ?Patient to be transferred to facility by: [facility] ?Name of family member notified: Wife Joreen [phone] ?Patient and family notified of of transfer: 01/03/22 ? ?Discharge Plan and Services ?  ?Discharge Planning Services: CM Consult ?           ?  ?  ?  ?  ?  ?  ?  ?  ?  ?  ? ?Social Determinants of Health (SDOH) Interventions ?  ? ? ?Readmission Risk Interventions ? ?  01/01/2022  ?  9:45 AM  ?Readmission Risk Prevention Plan  ?Transportation Screening Complete  ?Medication Review Press photographer) Complete  ?PCP or Specialist appointment within 3-5 days of discharge Complete  ?[place] or Home Care Consult Complete  ?SW Recovery Care/Counseling Consult Complete  ?Palliative Care Screening Not Applicable  ?[place] Not Applicable  ? ? ? ? ? ?

## 2022-01-03 NOTE — Progress Notes (Signed)
Manufacturing engineer [facility]) Hospital Liaison Note ? ?Bed offered and received at [facility] for transfer today. Unit RN please call report to [phone] prior to patient leaving the unit. Please send signed DNR and paperwork with patient. ? ?Please call with any questions or concerns. Thank you ? ?Roselee Nova, LCSW ?[facility] Liaison ?[phone] ?

## 2022-01-04 LAB — CULTURE, BLOOD (ROUTINE X 2)
Culture: NO GROWTH
Culture: NO GROWTH
Special Requests: ADEQUATE
Special Requests: ADEQUATE

## 2022-01-13 ENCOUNTER — Other Ambulatory Visit: Payer: BC Managed Care – PPO

## 2022-01-15 ENCOUNTER — Ambulatory Visit: Payer: BC Managed Care – PPO | Admitting: Endocrinology

## 2022-01-22 ENCOUNTER — Telehealth: Payer: Self-pay | Admitting: *Deleted

## 2022-01-22 ENCOUNTER — Telehealth: Payer: Self-pay | Admitting: Internal Medicine

## 2022-01-22 NOTE — Telephone Encounter (Signed)
Spouse has called in to let MD know that pt passed away on 02-18-2023.

## 2022-01-22 NOTE — Telephone Encounter (Signed)
Patient's wife called & left VM, stated patient passed away on 02/14/2022.  MD informed.

## 2022-01-30 DEATH — deceased

## 2022-02-16 ENCOUNTER — Other Ambulatory Visit: Payer: Self-pay | Admitting: Endocrinology

## 2022-04-18 ENCOUNTER — Other Ambulatory Visit: Payer: BC Managed Care – PPO

## 2022-04-18 ENCOUNTER — Ambulatory Visit: Payer: BC Managed Care – PPO | Admitting: Oncology

## 2022-11-14 IMAGING — NM NM BONE WHOLE BODY
4 series · 4 of 4 positions shown · non-contrast
Comparison: None

Radiographic correlation: CT abdomen and pelvis 04/08/2021

CLINICAL DATA: Prostate cancer metastatic to bone, lower back pain,
most recent PSA

EXAM:
NUCLEAR MEDICINE WHOLE BODY BONE SCAN
TECHNIQUE: Whole body anterior and posterior images were obtained approximately
3 hours after intravenous injection of radiopharmaceutical.
RADIOPHARMACEUTICALS:  19.5 mCi 3echnetium-BBm MDP IV

[Series 1: whole body · 2.66mm/px · 1 of 1 slices shown (1 of 2)]
[im 1/1]
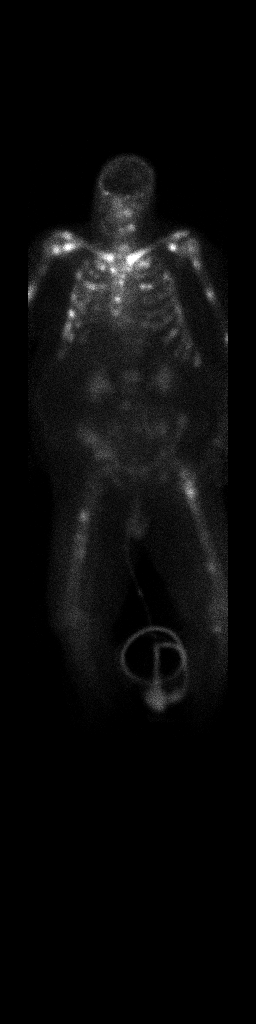

[Series 1: whole body · 2.66mm/px · 1 of 1 slices shown (2 of 2)]
[im 1/1]
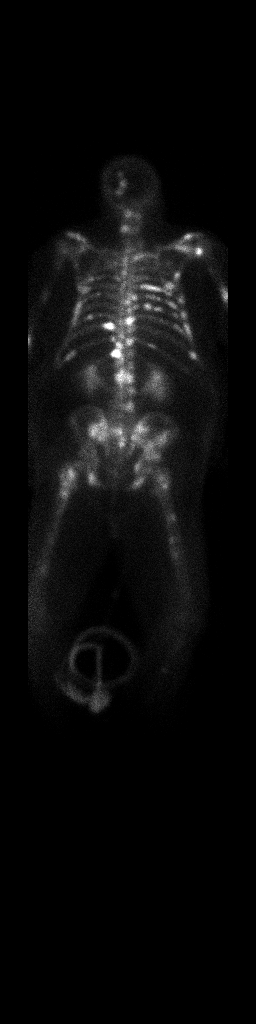

[Series 2: bone statics · 2.07mm/px · 1 of 1 slices shown (1 of 2)]
[im 1/1  full-range]
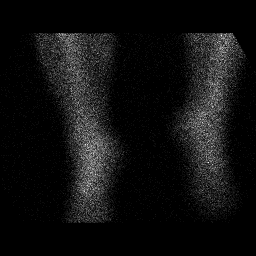

[Series 2: bone statics · 2.07mm/px · 1 of 1 slices shown (2 of 2)]
[im 1/1  full-range]
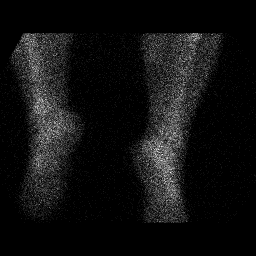

[4 of 4 positions shown; findings below may reference images not displayed]

FINDINGS: Numerous sites of abnormal osseous tracer accumulation throughout
the axial and appendicular skeleton consistent with widespread
osseous metastatic disease.

The sites include calvaria, cervical/thoracic/lumbar spine,
BILATERAL ribs, BILATERAL clavicles, RIGHT scapula, sternum, and
pelvis.

In addition, multiple foci of abnormal increased uptake are seen
within the humeri and femora bilaterally.

Expected urinary tract and soft tissue distribution of tracer.
IMPRESSION: Widespread osseous metastatic disease throughout the axial and
appendicular skeleton, including multiple sites of uptake within
BILATERAL humeri and femora.

Consider radiographic assessment of the humeri and femora to exclude
lesions at risk for pathologic fracture.
